# Patient Record
Sex: Male | Born: 1935 | Race: White | Hispanic: No | Marital: Married | Smoking: Never smoker
Health system: Southern US, Community
[De-identification: ages and names within clinical notes are randomized; demographics above are authoritative.]

## PROBLEM LIST (undated history)

## (undated) DIAGNOSIS — K226 Gastro-esophageal laceration-hemorrhage syndrome: Secondary | ICD-10-CM

## (undated) DIAGNOSIS — I2510 Atherosclerotic heart disease of native coronary artery without angina pectoris: Secondary | ICD-10-CM

## (undated) DIAGNOSIS — D649 Anemia, unspecified: Secondary | ICD-10-CM

## (undated) DIAGNOSIS — E039 Hypothyroidism, unspecified: Secondary | ICD-10-CM

## (undated) DIAGNOSIS — I743 Embolism and thrombosis of arteries of the lower extremities: Secondary | ICD-10-CM

## (undated) DIAGNOSIS — N400 Enlarged prostate without lower urinary tract symptoms: Secondary | ICD-10-CM

## (undated) DIAGNOSIS — S301XXA Contusion of abdominal wall, initial encounter: Secondary | ICD-10-CM

## (undated) DIAGNOSIS — K2270 Barrett's esophagus without dysplasia: Secondary | ICD-10-CM

## (undated) DIAGNOSIS — N62 Hypertrophy of breast: Secondary | ICD-10-CM

## (undated) DIAGNOSIS — Z9289 Personal history of other medical treatment: Secondary | ICD-10-CM

## (undated) DIAGNOSIS — I10 Essential (primary) hypertension: Secondary | ICD-10-CM

## (undated) DIAGNOSIS — F3289 Other specified depressive episodes: Secondary | ICD-10-CM

## (undated) DIAGNOSIS — I480 Paroxysmal atrial fibrillation: Secondary | ICD-10-CM

## (undated) DIAGNOSIS — I340 Nonrheumatic mitral (valve) insufficiency: Secondary | ICD-10-CM

## (undated) DIAGNOSIS — F329 Major depressive disorder, single episode, unspecified: Secondary | ICD-10-CM

## (undated) DIAGNOSIS — G4730 Sleep apnea, unspecified: Secondary | ICD-10-CM

## (undated) DIAGNOSIS — Z7901 Long term (current) use of anticoagulants: Secondary | ICD-10-CM

## (undated) DIAGNOSIS — K5730 Diverticulosis of large intestine without perforation or abscess without bleeding: Secondary | ICD-10-CM

## (undated) DIAGNOSIS — K219 Gastro-esophageal reflux disease without esophagitis: Secondary | ICD-10-CM

## (undated) DIAGNOSIS — E785 Hyperlipidemia, unspecified: Secondary | ICD-10-CM

## (undated) HISTORY — DX: Anemia, unspecified: D64.9

## (undated) HISTORY — DX: Sleep apnea, unspecified: G47.30

## (undated) HISTORY — PX: TRANSURETHRAL RESECTION OF PROSTATE: SHX73

## (undated) HISTORY — DX: Gastro-esophageal reflux disease without esophagitis: K21.9

## (undated) HISTORY — DX: Hypertrophy of breast: N62

## (undated) HISTORY — DX: Contusion of abdominal wall, initial encounter: S30.1XXA

## (undated) HISTORY — DX: Other specified depressive episodes: F32.89

## (undated) HISTORY — DX: Atherosclerotic heart disease of native coronary artery without angina pectoris: I25.10

## (undated) HISTORY — DX: Benign prostatic hyperplasia without lower urinary tract symptoms: N40.0

## (undated) HISTORY — DX: Essential (primary) hypertension: I10

## (undated) HISTORY — PX: ESOPHAGOGASTRODUODENOSCOPY: SHX1529

## (undated) HISTORY — DX: Hypothyroidism, unspecified: E03.9

## (undated) HISTORY — DX: Major depressive disorder, single episode, unspecified: F32.9

## (undated) HISTORY — DX: Hyperlipidemia, unspecified: E78.5

## (undated) HISTORY — PX: CORONARY ANGIOPLASTY WITH STENT PLACEMENT: SHX49

## (undated) HISTORY — DX: Diverticulosis of large intestine without perforation or abscess without bleeding: K57.30

---

## 1998-08-09 ENCOUNTER — Ambulatory Visit (HOSPITAL_COMMUNITY): Admission: RE | Admit: 1998-08-09 | Discharge: 1998-08-09 | Payer: Self-pay | Admitting: Gastroenterology

## 2000-03-06 ENCOUNTER — Other Ambulatory Visit: Admission: RE | Admit: 2000-03-06 | Discharge: 2000-03-06 | Payer: Self-pay | Admitting: Urology

## 2000-03-14 ENCOUNTER — Encounter: Payer: Self-pay | Admitting: Urology

## 2000-03-15 ENCOUNTER — Inpatient Hospital Stay (HOSPITAL_COMMUNITY): Admission: RE | Admit: 2000-03-15 | Discharge: 2000-03-18 | Payer: Self-pay | Admitting: Urology

## 2001-03-07 ENCOUNTER — Inpatient Hospital Stay (HOSPITAL_COMMUNITY): Admission: RE | Admit: 2001-03-07 | Discharge: 2001-03-10 | Payer: Self-pay | Admitting: *Deleted

## 2001-04-09 ENCOUNTER — Encounter: Admission: RE | Admit: 2001-04-09 | Discharge: 2001-07-08 | Payer: Self-pay | Admitting: Cardiology

## 2001-05-31 ENCOUNTER — Encounter (INDEPENDENT_AMBULATORY_CARE_PROVIDER_SITE_OTHER): Payer: Self-pay | Admitting: *Deleted

## 2001-05-31 ENCOUNTER — Ambulatory Visit (HOSPITAL_COMMUNITY): Admission: RE | Admit: 2001-05-31 | Discharge: 2001-05-31 | Payer: Self-pay | Admitting: Gastroenterology

## 2002-01-30 ENCOUNTER — Encounter: Admission: RE | Admit: 2002-01-30 | Discharge: 2002-04-30 | Payer: Self-pay | Admitting: Cardiology

## 2003-08-07 ENCOUNTER — Inpatient Hospital Stay (HOSPITAL_COMMUNITY): Admission: RE | Admit: 2003-08-07 | Discharge: 2003-08-12 | Payer: Self-pay | Admitting: Cardiology

## 2003-10-15 ENCOUNTER — Ambulatory Visit (HOSPITAL_COMMUNITY): Admission: RE | Admit: 2003-10-15 | Discharge: 2003-10-15 | Payer: Self-pay | Admitting: Cardiology

## 2004-05-23 ENCOUNTER — Ambulatory Visit: Payer: Self-pay | Admitting: Cardiology

## 2004-06-21 ENCOUNTER — Ambulatory Visit: Payer: Self-pay | Admitting: Internal Medicine

## 2004-06-24 ENCOUNTER — Ambulatory Visit: Payer: Self-pay

## 2004-07-15 ENCOUNTER — Ambulatory Visit: Payer: Self-pay | Admitting: Internal Medicine

## 2004-08-15 ENCOUNTER — Ambulatory Visit: Payer: Self-pay | Admitting: Internal Medicine

## 2004-09-15 ENCOUNTER — Ambulatory Visit: Payer: Self-pay | Admitting: Internal Medicine

## 2004-09-27 ENCOUNTER — Ambulatory Visit: Payer: Self-pay | Admitting: Internal Medicine

## 2004-10-13 ENCOUNTER — Ambulatory Visit: Payer: Self-pay | Admitting: Cardiology

## 2004-10-13 ENCOUNTER — Ambulatory Visit: Payer: Self-pay | Admitting: Internal Medicine

## 2004-10-13 ENCOUNTER — Inpatient Hospital Stay (HOSPITAL_COMMUNITY): Admission: EM | Admit: 2004-10-13 | Discharge: 2004-10-20 | Payer: Self-pay | Admitting: Emergency Medicine

## 2004-11-03 ENCOUNTER — Ambulatory Visit: Payer: Self-pay | Admitting: Cardiology

## 2004-11-07 ENCOUNTER — Encounter (HOSPITAL_COMMUNITY): Admission: RE | Admit: 2004-11-07 | Discharge: 2005-02-05 | Payer: Self-pay | Admitting: Cardiology

## 2004-12-09 ENCOUNTER — Ambulatory Visit: Payer: Self-pay | Admitting: Cardiology

## 2004-12-13 ENCOUNTER — Ambulatory Visit: Payer: Self-pay | Admitting: Internal Medicine

## 2004-12-30 ENCOUNTER — Ambulatory Visit: Payer: Self-pay | Admitting: Cardiology

## 2005-01-24 ENCOUNTER — Ambulatory Visit: Payer: Self-pay | Admitting: Internal Medicine

## 2005-02-07 ENCOUNTER — Encounter (HOSPITAL_COMMUNITY): Admission: RE | Admit: 2005-02-07 | Discharge: 2005-05-08 | Payer: Self-pay | Admitting: Cardiology

## 2005-03-09 ENCOUNTER — Inpatient Hospital Stay (HOSPITAL_COMMUNITY): Admission: EM | Admit: 2005-03-09 | Discharge: 2005-03-10 | Payer: Self-pay | Admitting: Emergency Medicine

## 2005-03-10 ENCOUNTER — Ambulatory Visit: Payer: Self-pay | Admitting: Internal Medicine

## 2005-03-13 ENCOUNTER — Ambulatory Visit: Payer: Self-pay | Admitting: Internal Medicine

## 2005-03-15 ENCOUNTER — Ambulatory Visit: Payer: Self-pay

## 2005-03-17 ENCOUNTER — Ambulatory Visit: Payer: Self-pay | Admitting: Cardiology

## 2005-04-25 ENCOUNTER — Ambulatory Visit: Payer: Self-pay | Admitting: Internal Medicine

## 2005-05-17 ENCOUNTER — Encounter (HOSPITAL_COMMUNITY): Admission: RE | Admit: 2005-05-17 | Discharge: 2005-08-15 | Payer: Self-pay | Admitting: Cardiology

## 2005-07-21 ENCOUNTER — Ambulatory Visit: Payer: Self-pay | Admitting: Cardiology

## 2005-07-25 ENCOUNTER — Ambulatory Visit: Payer: Self-pay | Admitting: Internal Medicine

## 2005-08-01 ENCOUNTER — Ambulatory Visit: Payer: Self-pay

## 2005-08-03 ENCOUNTER — Ambulatory Visit: Payer: Self-pay | Admitting: Critical Care Medicine

## 2005-08-17 ENCOUNTER — Encounter (HOSPITAL_COMMUNITY): Admission: RE | Admit: 2005-08-17 | Discharge: 2005-11-15 | Payer: Self-pay | Admitting: Cardiology

## 2005-10-13 ENCOUNTER — Ambulatory Visit: Payer: Self-pay | Admitting: Cardiovascular Disease

## 2005-10-16 ENCOUNTER — Ambulatory Visit: Payer: Self-pay | Admitting: Cardiology

## 2005-10-16 ENCOUNTER — Ambulatory Visit: Payer: Self-pay

## 2005-10-19 ENCOUNTER — Inpatient Hospital Stay (HOSPITAL_COMMUNITY): Admission: EM | Admit: 2005-10-19 | Discharge: 2005-10-25 | Payer: Self-pay | Admitting: Emergency Medicine

## 2005-10-19 ENCOUNTER — Ambulatory Visit: Payer: Self-pay | Admitting: Internal Medicine

## 2005-11-06 ENCOUNTER — Ambulatory Visit: Payer: Self-pay | Admitting: Pulmonary Disease

## 2005-11-10 ENCOUNTER — Ambulatory Visit: Payer: Self-pay | Admitting: Cardiology

## 2005-11-20 ENCOUNTER — Ambulatory Visit (HOSPITAL_COMMUNITY): Admission: RE | Admit: 2005-11-20 | Discharge: 2005-11-20 | Payer: Self-pay | Admitting: Vascular Surgery

## 2005-11-23 ENCOUNTER — Ambulatory Visit: Payer: Self-pay | Admitting: Internal Medicine

## 2005-12-08 ENCOUNTER — Ambulatory Visit (HOSPITAL_BASED_OUTPATIENT_CLINIC_OR_DEPARTMENT_OTHER): Admission: RE | Admit: 2005-12-08 | Discharge: 2005-12-08 | Payer: Self-pay | Admitting: Pulmonary Disease

## 2005-12-08 ENCOUNTER — Ambulatory Visit: Payer: Self-pay | Admitting: Pulmonary Disease

## 2005-12-28 ENCOUNTER — Ambulatory Visit: Payer: Self-pay | Admitting: Cardiology

## 2006-01-12 ENCOUNTER — Ambulatory Visit: Payer: Self-pay | Admitting: Pulmonary Disease

## 2006-01-19 ENCOUNTER — Ambulatory Visit: Payer: Self-pay | Admitting: Pulmonary Disease

## 2006-01-30 ENCOUNTER — Ambulatory Visit: Payer: Self-pay | Admitting: Pulmonary Disease

## 2006-02-26 ENCOUNTER — Ambulatory Visit: Payer: Self-pay | Admitting: Internal Medicine

## 2006-02-27 ENCOUNTER — Ambulatory Visit: Payer: Self-pay | Admitting: Pulmonary Disease

## 2006-03-20 ENCOUNTER — Encounter (HOSPITAL_COMMUNITY): Admission: RE | Admit: 2006-03-20 | Discharge: 2006-06-18 | Payer: Self-pay | Admitting: Cardiology

## 2006-04-09 ENCOUNTER — Ambulatory Visit: Payer: Self-pay | Admitting: Internal Medicine

## 2006-04-18 ENCOUNTER — Ambulatory Visit: Payer: Self-pay | Admitting: Internal Medicine

## 2006-04-19 ENCOUNTER — Ambulatory Visit: Payer: Self-pay | Admitting: Cardiology

## 2006-05-08 ENCOUNTER — Ambulatory Visit: Payer: Self-pay | Admitting: Pulmonary Disease

## 2006-06-12 ENCOUNTER — Ambulatory Visit: Payer: Self-pay | Admitting: Cardiology

## 2006-06-18 ENCOUNTER — Ambulatory Visit: Payer: Self-pay | Admitting: Internal Medicine

## 2006-06-19 ENCOUNTER — Encounter (HOSPITAL_COMMUNITY): Admission: RE | Admit: 2006-06-19 | Discharge: 2006-09-17 | Payer: Self-pay | Admitting: Cardiology

## 2006-07-05 ENCOUNTER — Ambulatory Visit: Payer: Self-pay | Admitting: Internal Medicine

## 2006-07-19 ENCOUNTER — Ambulatory Visit: Payer: Self-pay | Admitting: Internal Medicine

## 2006-08-15 ENCOUNTER — Ambulatory Visit: Payer: Self-pay | Admitting: Cardiovascular Disease

## 2006-08-15 LAB — CONVERTED CEMR LAB
ALT: 19 units/L (ref 0–40)
AST: 25 units/L (ref 0–37)
Albumin: 4 g/dL (ref 3.5–5.2)
BUN: 15 mg/dL (ref 6–23)
Basophils Absolute: 0 10*3/uL (ref 0.0–0.1)
Bilirubin, Direct: 0.1 mg/dL (ref 0.0–0.3)
Eosinophils Absolute: 0.1 10*3/uL (ref 0.0–0.6)
Glucose, Bld: 95 mg/dL (ref 70–99)
HCT: 40.9 % (ref 39.0–52.0)
Hemoglobin: 14.2 g/dL (ref 13.0–17.0)
MCHC: 34.9 g/dL (ref 30.0–36.0)
Monocytes Absolute: 0.5 10*3/uL (ref 0.2–0.7)
Monocytes Relative: 9.7 % (ref 3.0–11.0)
Neutro Abs: 3.3 10*3/uL (ref 1.4–7.7)
RBC: 4.32 M/uL (ref 4.22–5.81)
RDW: 13.4 % (ref 11.5–14.6)
Total Bilirubin: 0.6 mg/dL (ref 0.3–1.2)
Total Protein: 6.7 g/dL (ref 6.0–8.3)
WBC: 5.3 10*3/uL (ref 4.5–10.5)

## 2006-08-23 ENCOUNTER — Ambulatory Visit: Payer: Self-pay

## 2006-08-27 ENCOUNTER — Ambulatory Visit: Payer: Self-pay | Admitting: Cardiology

## 2006-09-10 ENCOUNTER — Ambulatory Visit: Payer: Self-pay | Admitting: Cardiology

## 2006-09-10 ENCOUNTER — Encounter: Payer: Self-pay | Admitting: Cardiovascular Disease

## 2006-09-10 ENCOUNTER — Ambulatory Visit: Payer: Self-pay

## 2006-09-10 LAB — CONVERTED CEMR LAB
AST: 23 units/L (ref 0–37)
Bilirubin, Direct: 0.1 mg/dL (ref 0.0–0.3)
HDL: 52.2 mg/dL (ref >39.0)
Total Bilirubin: 0.7 mg/dL (ref 0.3–1.2)
Total CHOL/HDL Ratio: 2.5
Total Protein: 6.5 g/dL (ref 6.0–8.3)
Triglycerides: 69 mg/dL (ref 0–149)

## 2006-09-18 ENCOUNTER — Encounter (HOSPITAL_COMMUNITY): Admission: RE | Admit: 2006-09-18 | Discharge: 2006-12-17 | Payer: Self-pay | Admitting: Cardiology

## 2006-09-20 ENCOUNTER — Ambulatory Visit: Payer: Self-pay | Admitting: Internal Medicine

## 2006-09-26 ENCOUNTER — Ambulatory Visit (HOSPITAL_BASED_OUTPATIENT_CLINIC_OR_DEPARTMENT_OTHER): Admission: RE | Admit: 2006-09-26 | Discharge: 2006-09-26 | Payer: Self-pay | Admitting: Pulmonary Disease

## 2006-09-26 ENCOUNTER — Ambulatory Visit: Payer: Self-pay | Admitting: Pulmonary Disease

## 2006-10-11 ENCOUNTER — Ambulatory Visit: Payer: Self-pay | Admitting: Internal Medicine

## 2006-10-16 ENCOUNTER — Ambulatory Visit: Payer: Self-pay | Admitting: Cardiology

## 2006-10-26 ENCOUNTER — Ambulatory Visit: Payer: Self-pay | Admitting: Pulmonary Disease

## 2006-11-21 ENCOUNTER — Ambulatory Visit (HOSPITAL_BASED_OUTPATIENT_CLINIC_OR_DEPARTMENT_OTHER): Admission: RE | Admit: 2006-11-21 | Discharge: 2006-11-21 | Payer: Self-pay | Admitting: Pulmonary Disease

## 2006-11-21 ENCOUNTER — Ambulatory Visit: Payer: Self-pay | Admitting: Pulmonary Disease

## 2006-12-18 ENCOUNTER — Encounter (HOSPITAL_COMMUNITY): Admission: RE | Admit: 2006-12-18 | Discharge: 2007-03-18 | Payer: Self-pay | Admitting: Cardiology

## 2007-01-07 ENCOUNTER — Ambulatory Visit: Payer: Self-pay | Admitting: Pulmonary Disease

## 2007-01-15 ENCOUNTER — Ambulatory Visit: Payer: Self-pay | Admitting: Internal Medicine

## 2007-01-15 ENCOUNTER — Encounter: Payer: Self-pay | Admitting: Internal Medicine

## 2007-01-15 ENCOUNTER — Ambulatory Visit: Payer: Self-pay | Admitting: Cardiology

## 2007-01-15 ENCOUNTER — Inpatient Hospital Stay (HOSPITAL_COMMUNITY): Admission: EM | Admit: 2007-01-15 | Discharge: 2007-01-19 | Payer: Self-pay | Admitting: Emergency Medicine

## 2007-01-15 HISTORY — PX: TEE WITH CARDIOVERSION: SHX5442

## 2007-01-16 ENCOUNTER — Encounter: Payer: Self-pay | Admitting: Cardiology

## 2007-01-17 ENCOUNTER — Encounter: Payer: Self-pay | Admitting: Internal Medicine

## 2007-01-17 ENCOUNTER — Ambulatory Visit: Payer: Self-pay | Admitting: Vascular Surgery

## 2007-01-22 ENCOUNTER — Ambulatory Visit: Payer: Self-pay | Admitting: Cardiology

## 2007-01-28 ENCOUNTER — Ambulatory Visit: Payer: Self-pay | Admitting: Cardiovascular Disease

## 2007-02-08 ENCOUNTER — Ambulatory Visit: Payer: Self-pay

## 2007-02-08 ENCOUNTER — Ambulatory Visit: Payer: Self-pay | Admitting: Cardiology

## 2007-02-13 ENCOUNTER — Ambulatory Visit: Payer: Self-pay | Admitting: Cardiology

## 2007-02-13 ENCOUNTER — Inpatient Hospital Stay (HOSPITAL_COMMUNITY): Admission: AD | Admit: 2007-02-13 | Discharge: 2007-02-21 | Payer: Self-pay | Admitting: Cardiology

## 2007-02-15 HISTORY — PX: OTHER SURGICAL HISTORY: SHX169

## 2007-02-26 ENCOUNTER — Ambulatory Visit: Payer: Self-pay | Admitting: Cardiology

## 2007-03-01 ENCOUNTER — Ambulatory Visit: Payer: Self-pay | Admitting: Cardiology

## 2007-03-05 ENCOUNTER — Ambulatory Visit: Payer: Self-pay | Admitting: Cardiology

## 2007-03-05 ENCOUNTER — Inpatient Hospital Stay (HOSPITAL_COMMUNITY): Admission: EM | Admit: 2007-03-05 | Discharge: 2007-03-08 | Payer: Self-pay | Admitting: Emergency Medicine

## 2007-03-13 ENCOUNTER — Ambulatory Visit: Payer: Self-pay | Admitting: Cardiology

## 2007-03-14 ENCOUNTER — Ambulatory Visit: Payer: Self-pay | Admitting: Cardiology

## 2007-03-19 ENCOUNTER — Encounter (HOSPITAL_COMMUNITY): Admission: RE | Admit: 2007-03-19 | Discharge: 2007-03-19 | Payer: Self-pay | Admitting: Cardiology

## 2007-03-21 ENCOUNTER — Ambulatory Visit: Payer: Self-pay | Admitting: Cardiology

## 2007-03-22 ENCOUNTER — Ambulatory Visit: Admission: RE | Admit: 2007-03-22 | Discharge: 2007-03-22 | Payer: Self-pay | Admitting: Cardiology

## 2007-03-25 ENCOUNTER — Ambulatory Visit: Payer: Self-pay | Admitting: Cardiology

## 2007-03-25 LAB — CONVERTED CEMR LAB: Prothrombin Time: 24.5 s — ABNORMAL HIGH (ref 10.9–13.3)

## 2007-04-01 ENCOUNTER — Ambulatory Visit: Payer: Self-pay | Admitting: Internal Medicine

## 2007-04-10 DIAGNOSIS — N400 Enlarged prostate without lower urinary tract symptoms: Secondary | ICD-10-CM

## 2007-04-10 DIAGNOSIS — K5730 Diverticulosis of large intestine without perforation or abscess without bleeding: Secondary | ICD-10-CM | POA: Insufficient documentation

## 2007-04-10 DIAGNOSIS — I10 Essential (primary) hypertension: Secondary | ICD-10-CM

## 2007-04-10 DIAGNOSIS — I2510 Atherosclerotic heart disease of native coronary artery without angina pectoris: Secondary | ICD-10-CM | POA: Insufficient documentation

## 2007-04-10 DIAGNOSIS — K219 Gastro-esophageal reflux disease without esophagitis: Secondary | ICD-10-CM | POA: Insufficient documentation

## 2007-04-12 ENCOUNTER — Ambulatory Visit: Payer: Self-pay | Admitting: Internal Medicine

## 2007-04-22 ENCOUNTER — Ambulatory Visit: Payer: Self-pay | Admitting: Cardiology

## 2007-05-06 ENCOUNTER — Ambulatory Visit: Payer: Self-pay | Admitting: Cardiology

## 2007-05-09 ENCOUNTER — Ambulatory Visit: Payer: Self-pay | Admitting: Pulmonary Disease

## 2007-05-20 ENCOUNTER — Ambulatory Visit: Payer: Self-pay | Admitting: Cardiology

## 2007-05-20 ENCOUNTER — Ambulatory Visit: Payer: Self-pay | Admitting: Pulmonary Disease

## 2007-05-20 DIAGNOSIS — G4733 Obstructive sleep apnea (adult) (pediatric): Secondary | ICD-10-CM | POA: Insufficient documentation

## 2007-05-20 DIAGNOSIS — E039 Hypothyroidism, unspecified: Secondary | ICD-10-CM | POA: Insufficient documentation

## 2007-05-29 ENCOUNTER — Ambulatory Visit: Payer: Self-pay | Admitting: Internal Medicine

## 2007-06-03 ENCOUNTER — Ambulatory Visit: Payer: Self-pay | Admitting: Cardiology

## 2007-06-17 ENCOUNTER — Ambulatory Visit: Payer: Self-pay

## 2007-06-17 ENCOUNTER — Encounter: Payer: Self-pay | Admitting: Cardiology

## 2007-07-02 ENCOUNTER — Ambulatory Visit: Payer: Self-pay | Admitting: Internal Medicine

## 2007-07-24 ENCOUNTER — Ambulatory Visit: Payer: Self-pay | Admitting: Cardiovascular Disease

## 2007-08-08 ENCOUNTER — Ambulatory Visit: Payer: Self-pay | Admitting: Cardiology

## 2007-08-09 ENCOUNTER — Encounter: Payer: Self-pay | Admitting: Pulmonary Disease

## 2007-08-09 ENCOUNTER — Ambulatory Visit: Payer: Self-pay

## 2007-08-22 ENCOUNTER — Ambulatory Visit: Payer: Self-pay | Admitting: Cardiology

## 2007-08-27 ENCOUNTER — Encounter (HOSPITAL_COMMUNITY): Admission: RE | Admit: 2007-08-27 | Discharge: 2007-11-15 | Payer: Self-pay | Admitting: Cardiology

## 2007-09-05 ENCOUNTER — Ambulatory Visit: Payer: Self-pay | Admitting: Cardiology

## 2007-09-26 ENCOUNTER — Ambulatory Visit: Payer: Self-pay | Admitting: Internal Medicine

## 2007-09-26 ENCOUNTER — Ambulatory Visit: Payer: Self-pay | Admitting: Cardiology

## 2007-09-26 LAB — CONVERTED CEMR LAB
ALT: 20 units/L (ref 0–53)
AST: 24 units/L (ref 0–37)
Albumin: 3.8 g/dL (ref 3.5–5.2)
Alkaline Phosphatase: 86 units/L (ref 39–117)
BUN: 15 mg/dL (ref 6–23)
Bilirubin, Direct: 0.1 mg/dL (ref 0.0–0.3)
Calcium: 8.7 mg/dL (ref 8.4–10.5)
Chloride: 108 meq/L (ref 96–112)
GFR calc Af Amer: 75 mL/min
GFR calc non Af Amer: 62 mL/min
Glucose, Bld: 118 mg/dL — ABNORMAL HIGH (ref 70–99)
Hemoglobin: 12.7 g/dL — ABNORMAL LOW (ref 13.0–17.0)
Lymphocytes Relative: 20.8 % (ref 12.0–46.0)
MCHC: 33.2 g/dL (ref 30.0–36.0)
Monocytes Absolute: 0.5 10*3/uL (ref 0.2–0.7)
Neutro Abs: 3.3 10*3/uL (ref 1.4–7.7)
Platelets: 183 10*3/uL (ref 150–400)
Potassium: 4.3 meq/L (ref 3.5–5.1)
TSH: 3.18 microintl units/mL (ref 0.35–5.50)
Total Bilirubin: 0.6 mg/dL (ref 0.3–1.2)
Total Protein: 6.1 g/dL (ref 6.0–8.3)
WBC: 4.9 10*3/uL (ref 4.5–10.5)

## 2007-10-10 ENCOUNTER — Ambulatory Visit: Payer: Self-pay | Admitting: Cardiology

## 2007-10-10 ENCOUNTER — Ambulatory Visit: Payer: Self-pay | Admitting: Internal Medicine

## 2007-10-31 ENCOUNTER — Ambulatory Visit: Payer: Self-pay | Admitting: Cardiology

## 2007-11-12 ENCOUNTER — Ambulatory Visit: Payer: Self-pay | Admitting: Cardiology

## 2007-11-17 ENCOUNTER — Encounter (HOSPITAL_COMMUNITY): Admission: RE | Admit: 2007-11-17 | Discharge: 2008-01-15 | Payer: Self-pay | Admitting: Cardiology

## 2007-11-27 ENCOUNTER — Ambulatory Visit: Payer: Self-pay | Admitting: Cardiology

## 2007-12-04 ENCOUNTER — Encounter: Payer: Self-pay | Admitting: Internal Medicine

## 2007-12-04 ENCOUNTER — Ambulatory Visit: Payer: Self-pay

## 2007-12-05 ENCOUNTER — Ambulatory Visit: Payer: Self-pay | Admitting: Cardiology

## 2007-12-05 ENCOUNTER — Inpatient Hospital Stay (HOSPITAL_COMMUNITY): Admission: EM | Admit: 2007-12-05 | Discharge: 2007-12-06 | Payer: Self-pay | Admitting: Emergency Medicine

## 2007-12-10 ENCOUNTER — Ambulatory Visit: Payer: Self-pay | Admitting: Cardiology

## 2007-12-17 ENCOUNTER — Ambulatory Visit: Payer: Self-pay | Admitting: Internal Medicine

## 2007-12-17 DIAGNOSIS — F329 Major depressive disorder, single episode, unspecified: Secondary | ICD-10-CM

## 2008-01-07 ENCOUNTER — Ambulatory Visit: Payer: Self-pay | Admitting: Cardiovascular Disease

## 2008-01-07 ENCOUNTER — Ambulatory Visit: Payer: Self-pay | Admitting: Internal Medicine

## 2008-01-16 ENCOUNTER — Encounter (HOSPITAL_COMMUNITY): Admission: RE | Admit: 2008-01-16 | Discharge: 2008-04-15 | Payer: Self-pay | Admitting: Cardiology

## 2008-02-04 ENCOUNTER — Ambulatory Visit: Payer: Self-pay | Admitting: Cardiology

## 2008-02-28 ENCOUNTER — Ambulatory Visit: Payer: Self-pay | Admitting: Cardiology

## 2008-02-28 ENCOUNTER — Ambulatory Visit: Payer: Self-pay | Admitting: Cardiovascular Disease

## 2008-03-04 ENCOUNTER — Encounter: Payer: Self-pay | Admitting: Internal Medicine

## 2008-03-04 ENCOUNTER — Ambulatory Visit: Admission: RE | Admit: 2008-03-04 | Discharge: 2008-03-04 | Payer: Self-pay | Admitting: Cardiology

## 2008-03-09 ENCOUNTER — Ambulatory Visit: Payer: Self-pay | Admitting: Internal Medicine

## 2008-03-26 ENCOUNTER — Ambulatory Visit: Payer: Self-pay | Admitting: Cardiology

## 2008-04-16 ENCOUNTER — Encounter (HOSPITAL_COMMUNITY): Admission: RE | Admit: 2008-04-16 | Discharge: 2008-07-13 | Payer: Self-pay | Admitting: Cardiology

## 2008-04-28 ENCOUNTER — Ambulatory Visit: Payer: Self-pay | Admitting: Cardiovascular Disease

## 2008-04-29 ENCOUNTER — Ambulatory Visit: Payer: Self-pay

## 2008-04-29 ENCOUNTER — Encounter: Payer: Self-pay | Admitting: Internal Medicine

## 2008-05-26 ENCOUNTER — Ambulatory Visit: Payer: Self-pay | Admitting: Internal Medicine

## 2008-05-27 ENCOUNTER — Ambulatory Visit: Payer: Self-pay | Admitting: Internal Medicine

## 2008-06-17 ENCOUNTER — Ambulatory Visit: Payer: Self-pay

## 2008-06-17 ENCOUNTER — Ambulatory Visit: Payer: Self-pay | Admitting: Cardiology

## 2008-06-17 ENCOUNTER — Ambulatory Visit: Payer: Self-pay | Admitting: Internal Medicine

## 2008-06-17 ENCOUNTER — Encounter: Payer: Self-pay | Admitting: Cardiology

## 2008-07-08 ENCOUNTER — Ambulatory Visit: Payer: Self-pay | Admitting: Cardiovascular Disease

## 2008-07-17 ENCOUNTER — Encounter (HOSPITAL_COMMUNITY): Admission: RE | Admit: 2008-07-17 | Discharge: 2008-10-15 | Payer: Self-pay | Admitting: Cardiology

## 2008-08-05 ENCOUNTER — Ambulatory Visit: Payer: Self-pay | Admitting: Internal Medicine

## 2008-09-02 ENCOUNTER — Ambulatory Visit: Payer: Self-pay | Admitting: Cardiology

## 2008-09-30 ENCOUNTER — Ambulatory Visit: Payer: Self-pay | Admitting: Cardiology

## 2008-10-07 ENCOUNTER — Ambulatory Visit: Payer: Self-pay | Admitting: Internal Medicine

## 2008-10-07 LAB — CONVERTED CEMR LAB
ALT: 20 units/L (ref 0–53)
AST: 24 units/L (ref 0–37)
Albumin: 3.6 g/dL (ref 3.5–5.2)
Basophils Relative: 0 % (ref 0.0–3.0)
CO2: 30 meq/L (ref 19–32)
Calcium: 8.9 mg/dL (ref 8.4–10.5)
Creatinine, Ser: 1.2 mg/dL (ref 0.4–1.5)
Eosinophils Relative: 1.5 % (ref 0.0–5.0)
GFR calc non Af Amer: 62.02 mL/min (ref >60)
Glucose, Bld: 89 mg/dL (ref 70–99)
HCT: 38.6 % — ABNORMAL LOW (ref 39.0–52.0)
HDL: 61.1 mg/dL (ref >39.00)
MCV: 92.4 fL (ref 78.0–100.0)
Monocytes Absolute: 0.5 10*3/uL (ref 0.1–1.0)
Monocytes Relative: 10 % (ref 3.0–12.0)
Neutro Abs: 3.1 10*3/uL (ref 1.4–7.7)
Neutrophils Relative %: 65 % (ref 43.0–77.0)
RDW: 14.3 % (ref 11.5–14.6)
Sodium: 143 meq/L (ref 135–145)
TSH: 2.15 microintl units/mL (ref 0.35–5.50)
Total Bilirubin: 0.9 mg/dL (ref 0.3–1.2)
Triglycerides: 32 mg/dL (ref 0.0–149.0)
VLDL: 6.4 mg/dL (ref 0.0–40.0)

## 2008-10-16 ENCOUNTER — Encounter (HOSPITAL_COMMUNITY): Admission: RE | Admit: 2008-10-16 | Discharge: 2009-01-14 | Payer: Self-pay | Admitting: Cardiology

## 2008-10-20 ENCOUNTER — Encounter: Payer: Self-pay | Admitting: Cardiology

## 2008-10-20 ENCOUNTER — Ambulatory Visit: Payer: Self-pay

## 2008-10-20 DIAGNOSIS — E785 Hyperlipidemia, unspecified: Secondary | ICD-10-CM

## 2008-10-20 DIAGNOSIS — I340 Nonrheumatic mitral (valve) insufficiency: Secondary | ICD-10-CM

## 2008-11-26 ENCOUNTER — Ambulatory Visit: Payer: Self-pay | Admitting: Cardiovascular Disease

## 2008-12-15 ENCOUNTER — Encounter: Payer: Self-pay | Admitting: *Deleted

## 2008-12-17 ENCOUNTER — Ambulatory Visit: Payer: Self-pay | Admitting: Cardiology

## 2008-12-17 LAB — CONVERTED CEMR LAB
POC INR: 3.2
Protime: 21.6

## 2008-12-24 ENCOUNTER — Ambulatory Visit: Payer: Self-pay | Admitting: Cardiology

## 2008-12-24 LAB — CONVERTED CEMR LAB
POC INR: 2.3
Protime: 18.7

## 2008-12-25 ENCOUNTER — Encounter: Payer: Self-pay | Admitting: Internal Medicine

## 2009-01-12 ENCOUNTER — Encounter: Payer: Self-pay | Admitting: Cardiology

## 2009-01-15 ENCOUNTER — Encounter (HOSPITAL_COMMUNITY): Admission: RE | Admit: 2009-01-15 | Discharge: 2009-04-15 | Payer: Self-pay | Admitting: Cardiology

## 2009-01-20 ENCOUNTER — Encounter: Payer: Self-pay | Admitting: *Deleted

## 2009-01-21 ENCOUNTER — Ambulatory Visit: Payer: Self-pay | Admitting: Cardiology

## 2009-02-04 ENCOUNTER — Ambulatory Visit: Payer: Self-pay | Admitting: Cardiovascular Disease

## 2009-02-04 LAB — CONVERTED CEMR LAB: Prothrombin Time: 22.9 s

## 2009-02-16 ENCOUNTER — Ambulatory Visit: Payer: Self-pay | Admitting: Cardiovascular Disease

## 2009-02-16 LAB — CONVERTED CEMR LAB
POC INR: 2.8
Prothrombin Time: 20.1 s

## 2009-02-23 ENCOUNTER — Ambulatory Visit: Payer: Self-pay | Admitting: Internal Medicine

## 2009-03-03 ENCOUNTER — Encounter (INDEPENDENT_AMBULATORY_CARE_PROVIDER_SITE_OTHER): Payer: Self-pay | Admitting: *Deleted

## 2009-03-11 ENCOUNTER — Ambulatory Visit: Payer: Self-pay | Admitting: Cardiovascular Disease

## 2009-04-08 ENCOUNTER — Ambulatory Visit: Payer: Self-pay | Admitting: Internal Medicine

## 2009-04-08 LAB — CONVERTED CEMR LAB: POC INR: 2.8

## 2009-04-09 ENCOUNTER — Ambulatory Visit: Payer: Self-pay | Admitting: Internal Medicine

## 2009-04-16 ENCOUNTER — Encounter (HOSPITAL_COMMUNITY): Admission: RE | Admit: 2009-04-16 | Discharge: 2009-07-15 | Payer: Self-pay | Admitting: Cardiology

## 2009-05-06 ENCOUNTER — Ambulatory Visit: Payer: Self-pay | Admitting: Cardiovascular Disease

## 2009-05-06 LAB — CONVERTED CEMR LAB: POC INR: 2.2

## 2009-05-10 ENCOUNTER — Encounter: Payer: Self-pay | Admitting: Cardiology

## 2009-05-11 ENCOUNTER — Ambulatory Visit: Payer: Self-pay | Admitting: Cardiology

## 2009-05-11 DIAGNOSIS — R079 Chest pain, unspecified: Secondary | ICD-10-CM

## 2009-05-18 ENCOUNTER — Telehealth (INDEPENDENT_AMBULATORY_CARE_PROVIDER_SITE_OTHER): Payer: Self-pay | Admitting: *Deleted

## 2009-05-19 ENCOUNTER — Encounter (HOSPITAL_COMMUNITY): Admission: RE | Admit: 2009-05-19 | Discharge: 2009-07-14 | Payer: Self-pay | Admitting: Cardiology

## 2009-05-19 ENCOUNTER — Ambulatory Visit: Payer: Self-pay | Admitting: Cardiovascular Disease

## 2009-05-19 ENCOUNTER — Ambulatory Visit: Payer: Self-pay

## 2009-06-03 ENCOUNTER — Ambulatory Visit: Payer: Self-pay | Admitting: Internal Medicine

## 2009-06-17 ENCOUNTER — Encounter (INDEPENDENT_AMBULATORY_CARE_PROVIDER_SITE_OTHER): Payer: Self-pay | Admitting: *Deleted

## 2009-06-22 ENCOUNTER — Ambulatory Visit (HOSPITAL_COMMUNITY): Admission: RE | Admit: 2009-06-22 | Discharge: 2009-06-22 | Payer: Self-pay | Admitting: Cardiology

## 2009-06-22 ENCOUNTER — Ambulatory Visit: Payer: Self-pay | Admitting: Cardiovascular Disease

## 2009-06-22 ENCOUNTER — Ambulatory Visit: Payer: Self-pay

## 2009-06-22 ENCOUNTER — Encounter: Payer: Self-pay | Admitting: Cardiology

## 2009-06-22 ENCOUNTER — Ambulatory Visit: Payer: Self-pay | Admitting: Internal Medicine

## 2009-07-17 ENCOUNTER — Encounter (HOSPITAL_COMMUNITY): Admission: RE | Admit: 2009-07-17 | Discharge: 2009-10-15 | Payer: Self-pay | Admitting: Cardiology

## 2009-07-20 ENCOUNTER — Ambulatory Visit: Payer: Self-pay | Admitting: Cardiology

## 2009-07-23 ENCOUNTER — Encounter: Payer: Self-pay | Admitting: Cardiology

## 2009-08-03 ENCOUNTER — Ambulatory Visit: Payer: Self-pay | Admitting: Cardiology

## 2009-08-05 ENCOUNTER — Ambulatory Visit: Payer: Self-pay | Admitting: Internal Medicine

## 2009-08-05 LAB — CONVERTED CEMR LAB
ALT: 26 units/L (ref 0–53)
AST: 30 units/L (ref 0–37)
Albumin: 3.8 g/dL (ref 3.5–5.2)
Eosinophils Relative: 2.2 % (ref 0.0–5.0)
HCT: 36 % — ABNORMAL LOW (ref 39.0–52.0)
Hemoglobin: 11.8 g/dL — ABNORMAL LOW (ref 13.0–17.0)
Lymphs Abs: 1.3 10*3/uL (ref 0.7–4.0)
MCV: 94.6 fL (ref 78.0–100.0)
Monocytes Absolute: 0.6 10*3/uL (ref 0.1–1.0)
Neutro Abs: 2.6 10*3/uL (ref 1.4–7.7)
Platelets: 167 10*3/uL (ref 150.0–400.0)
RDW: 14.6 % (ref 11.5–14.6)
TSH: 3.36 microintl units/mL (ref 0.35–5.50)
Total Bilirubin: 0.7 mg/dL (ref 0.3–1.2)
WBC: 4.6 10*3/uL (ref 4.5–10.5)

## 2009-08-09 ENCOUNTER — Encounter: Payer: Self-pay | Admitting: Cardiology

## 2009-08-17 ENCOUNTER — Ambulatory Visit: Payer: Self-pay | Admitting: Internal Medicine

## 2009-08-17 LAB — CONVERTED CEMR LAB: POC INR: 2.8

## 2009-08-25 ENCOUNTER — Encounter: Payer: Self-pay | Admitting: Cardiology

## 2009-09-14 ENCOUNTER — Encounter: Payer: Self-pay | Admitting: Cardiology

## 2009-09-14 ENCOUNTER — Ambulatory Visit: Payer: Self-pay | Admitting: Cardiovascular Disease

## 2009-09-14 LAB — CONVERTED CEMR LAB: POC INR: 3.3

## 2009-09-16 ENCOUNTER — Ambulatory Visit: Payer: Self-pay | Admitting: Internal Medicine

## 2009-09-16 DIAGNOSIS — M549 Dorsalgia, unspecified: Secondary | ICD-10-CM | POA: Insufficient documentation

## 2009-09-20 ENCOUNTER — Ambulatory Visit: Payer: Self-pay | Admitting: Internal Medicine

## 2009-09-21 ENCOUNTER — Telehealth: Payer: Self-pay | Admitting: Internal Medicine

## 2009-09-21 ENCOUNTER — Ambulatory Visit: Payer: Self-pay | Admitting: Internal Medicine

## 2009-09-22 ENCOUNTER — Emergency Department (HOSPITAL_COMMUNITY): Admission: EM | Admit: 2009-09-22 | Discharge: 2009-09-22 | Payer: Self-pay | Admitting: Emergency Medicine

## 2009-09-22 ENCOUNTER — Telehealth: Payer: Self-pay | Admitting: Internal Medicine

## 2009-10-05 ENCOUNTER — Ambulatory Visit: Payer: Self-pay | Admitting: Cardiovascular Disease

## 2009-10-16 ENCOUNTER — Encounter (HOSPITAL_COMMUNITY): Admission: RE | Admit: 2009-10-16 | Discharge: 2009-12-14 | Payer: Self-pay | Admitting: Cardiology

## 2009-10-19 ENCOUNTER — Ambulatory Visit: Payer: Self-pay | Admitting: Cardiology

## 2009-10-19 LAB — CONVERTED CEMR LAB: POC INR: 2.8

## 2009-10-26 ENCOUNTER — Telehealth (INDEPENDENT_AMBULATORY_CARE_PROVIDER_SITE_OTHER): Payer: Self-pay | Admitting: *Deleted

## 2009-11-04 ENCOUNTER — Ambulatory Visit: Payer: Self-pay | Admitting: Internal Medicine

## 2009-11-04 LAB — CONVERTED CEMR LAB: POC INR: 3

## 2009-11-09 ENCOUNTER — Ambulatory Visit: Payer: Self-pay | Admitting: Emergency Medicine

## 2009-11-09 ENCOUNTER — Ambulatory Visit: Payer: Self-pay | Admitting: Internal Medicine

## 2009-11-09 ENCOUNTER — Inpatient Hospital Stay (HOSPITAL_COMMUNITY): Admission: AD | Admit: 2009-11-09 | Discharge: 2009-11-22 | Payer: Self-pay | Admitting: Internal Medicine

## 2009-11-09 DIAGNOSIS — R5381 Other malaise: Secondary | ICD-10-CM

## 2009-11-09 DIAGNOSIS — R5383 Other fatigue: Secondary | ICD-10-CM

## 2009-11-09 DIAGNOSIS — Z9189 Other specified personal risk factors, not elsewhere classified: Secondary | ICD-10-CM | POA: Insufficient documentation

## 2009-11-15 ENCOUNTER — Encounter (INDEPENDENT_AMBULATORY_CARE_PROVIDER_SITE_OTHER): Payer: Self-pay | Admitting: Emergency Medicine

## 2009-11-15 ENCOUNTER — Encounter: Payer: Self-pay | Admitting: Cardiology

## 2009-11-25 ENCOUNTER — Ambulatory Visit: Payer: Self-pay | Admitting: Cardiology

## 2009-11-29 ENCOUNTER — Ambulatory Visit: Payer: Self-pay | Admitting: Internal Medicine

## 2009-11-29 LAB — CONVERTED CEMR LAB
Basophils Absolute: 0.1 10*3/uL (ref 0.0–0.1)
CO2: 29 meq/L (ref 19–32)
Chloride: 103 meq/L (ref 96–112)
Eosinophils Relative: 1.2 % (ref 0.0–5.0)
Lymphocytes Relative: 20.8 % (ref 12.0–46.0)
Monocytes Relative: 10 % (ref 3.0–12.0)
Platelets: 461 10*3/uL — ABNORMAL HIGH (ref 150.0–400.0)
Potassium: 5.1 meq/L (ref 3.5–5.1)
RDW: 17.5 % — ABNORMAL HIGH (ref 11.5–14.6)
Sodium: 140 meq/L (ref 135–145)
WBC: 7.2 10*3/uL (ref 4.5–10.5)

## 2009-12-01 ENCOUNTER — Encounter: Payer: Self-pay | Admitting: Internal Medicine

## 2009-12-16 ENCOUNTER — Ambulatory Visit: Payer: Self-pay | Admitting: Internal Medicine

## 2009-12-28 ENCOUNTER — Ambulatory Visit: Payer: Self-pay | Admitting: Internal Medicine

## 2010-02-01 ENCOUNTER — Encounter: Payer: Self-pay | Admitting: Cardiology

## 2010-03-23 ENCOUNTER — Ambulatory Visit: Payer: Self-pay | Admitting: Internal Medicine

## 2010-03-23 DIAGNOSIS — N62 Hypertrophy of breast: Secondary | ICD-10-CM

## 2010-03-29 ENCOUNTER — Ambulatory Visit: Payer: Self-pay | Admitting: Cardiology

## 2010-04-19 ENCOUNTER — Telehealth: Payer: Self-pay | Admitting: Internal Medicine

## 2010-04-19 ENCOUNTER — Ambulatory Visit: Payer: Self-pay | Admitting: Internal Medicine

## 2010-08-13 ENCOUNTER — Encounter: Payer: Self-pay | Admitting: Internal Medicine

## 2010-08-18 NOTE — Assessment & Plan Note (Signed)
Summary: acute back pain/dm   Vital Signs:  [name] profile:   77 year old male Weight:      202 pounds Temp:     98.7 degrees F oral BP sitting:   120 / 78  (left arm) Cuff size:   regular  Vitals Entered By: Duard Brady LPN (September 16, 1608 1:02 PM) CC: c/o back pain, (L) hip pain - no fall or injury noted , Tylenol otc not helping Is [name] Diabetic? No   Primary Care Provider:  Gordy Savers  MD  CC:  c/o back pain, (L) hip pain - no fall or injury noted , and Tylenol otc not helping.  History of Present Illness:  [name], who presents with a one month history of right flank pain.  He aggravated this earlier today when he was vacuuming.  Pain is in the right flank and lateral abdominal wall region.  Pain is aggravated by side-to-side twisting, bending, and stooping.  He is on chronic Coumadin and has taken only Tylenol  Allergies (verified): No Known Drug Allergies  Past History:  Past Medical History: Reviewed history from 05/11/2009 and no changes required. DJD Coronary artery disease (s/p inferior MI April 2006 with stenting to proximal and distal RCA) Diverticulosis, colon GERD Hypertension Benign prostatic hypertrophy Depression Anticoagulation therapy Atrial fibrillation s/p cardioversion Sleep Apnea (on CPAP) Mitral regurgitation Hypothyroidism  Review of Systems       The [name] complains of abdominal pain.  The [name] denies anorexia, fever, weight loss, weight gain, vision loss, decreased hearing, hoarseness, chest pain, syncope, dyspnea on exertion, peripheral edema, prolonged cough, headaches, hemoptysis, melena, hematochezia, severe indigestion/heartburn, hematuria, incontinence, genital sores, muscle weakness, suspicious skin lesions, transient blindness, difficulty walking, depression, unusual weight change, abnormal bleeding, enlarged lymph nodes, angioedema, breast masses, and testicular masses.    Physical  Exam  General:  Well-developed,well-nourished,in no acute distress; alert,appropriate and cooperative throughout examination;slightly uncomfortable, but in no acute distress Neck:  No deformities, masses, or tenderness noted. Lungs:  Normal respiratory effort, chest expands symmetrically. Lungs are clear to auscultation, no crackles or wheezes. Heart:  Normal rate and regular rhythm. S1 and S2 normal without gallop, murmur, click, rub or other extra sounds. Abdomen:  Bowel sounds positive,abdomen soft and non-tender without masses, organomegaly or hernias noted. Msk:  mild tenderness in the right costovertebral angle.  Also, some tenderness along the right costal margin anterolaterally   Impression & Recommendations:  Problem # 1:  BACK PAIN (ICD-724.5)  His updated medication list for this problem includes:    Adult Aspirin Ec Low Strength 81 Mg Tbec (Aspirin) .[name]... 1 once daily    Tramadol Hcl 50 Mg Tabs (Tramadol hcl) ..... One every 6 hours as needed for pain  Problem # 2:  ANTICOAGULATION THERAPY (ICD-V58.61)  Problem # 3:  ATRIAL FIBRILLATION (ICD-427.31)  His updated medication list for this problem includes:    Amiodarone Hcl 200 Mg Tabs (Amiodarone hcl) .[name]... 1 once daily    Norvasc 5 Mg Tabs (Amlodipine besylate) ..... One daily    Adult Aspirin Ec Low Strength 81 Mg Tbec (Aspirin) .[name]... 1 once daily    Coumadin 5 Mg Tabs (Warfarin sodium) .[name]... Take as directed by coumadin clinic.  Complete Medication List: 1)  Levothyroxine Sodium 25 Mcg Tabs (Levothyroxine sodium) .[name].. 1 once daily 2)  Protonix 40 Mg Tbec (Pantoprazole sodium) .... 2 daily 3)  Clonazepam 1 Mg Tabs (Clonazepam) .[name].. 1  at bedtime 4)  Amiodarone Hcl 200 Mg  Tabs (Amiodarone hcl) .[name].. 1 once daily 5)  Trazodone Hcl 50 Mg Tabs (Trazodone hcl) .... One  at bedtime 6)  Norvasc 5 Mg Tabs (Amlodipine besylate) .... One daily 7)  Pravastatin Sodium 80 Mg Tabs (Pravastatin sodium) .... One daily 8)  Adult  Aspirin Ec Low Strength 81 Mg Tbec (Aspirin) .[name].. 1 once daily 9)  Mvi  .... One by mouth daily 10)  Coq10  .... One by mouth daily 11)  Coumadin 5 Mg Tabs (Warfarin sodium) .... Take as directed by coumadin clinic. 12)  Nitrostat 0.4 Mg Subl (Nitroglycerin) .... As needed 13)  Prednisone 5 Mg Tabs (Prednisone) .... One twice daily 14)  Tramadol Hcl 50 Mg Tabs (Tramadol hcl) .... One every 6 hours as needed for pain  [name] Instructions: 1)  Please schedule a follow-up appointment in 3 months. 2)  notify the office if unimproved Prescriptions: TRAMADOL HCL 50 MG TABS (TRAMADOL HCL) one every 6 hours as needed for pain  #50 x 2   Entered and Authorized by:   Gordy Savers  MD   Signed by:   Gordy Savers  MD on 09/16/2009   Method used:   Print then Give to [name]   RxID:   [id number] PREDNISONE 5 MG TABS (PREDNISONE) one twice daily  #20 x 0   Entered and Authorized by:   Gordy Savers  MD   Signed by:   Gordy Savers  MD on 09/16/2009   Method used:   Print then Give to [name]   RxID:   [id number]

## 2010-08-18 NOTE — Assessment & Plan Note (Signed)
Summary: Pneumonia/pts been in hosp/cjr   Vital Signs:  Patient profile:   77 year old male Weight:      186 pounds O2 Sat:      97 % on Room air Temp:     98.0 degrees F oral BP sitting:   130 / 80  (right arm) Cuff size:   regular  Vitals Entered By: Duard Brady LPN (Nov 29, 2009 11:20 AM)  O2 Flow:  Room air CC: f/u past cardio appt per cariologist request   - post hostpital ?  Is Patient Diabetic? No   Primary Care Provider:  Gordy Savers  MD  CC:  f/u past cardio appt per cariologist request   - post hostpital ? .  History of Present Illness: 77 year old patient who is seen today, post hospital discharge for pneumonia and sepsis syndrome.  He remains quite weak, but doing remarkably well.  He is followed closely by cardiology.  His coronary artery disease, hypertension, paroxysmal atrial fibrillation.  He remains on amiodarone the Coumadin anticoagulation has been held due to a hematoma in his abdominal wall resulting from a fall.  At the present time.  He is on aspirin 81 mg daily.  He continues to improve daily.  He still uses a 4-point walker;  hospital records reviewed  Allergies (verified): No Known Drug Allergies  Past History:  Past Medical History: Reviewed history from 11/25/2009 and no changes required. DJD Coronary artery disease (s/p inferior MI April 2006 with stenting to proximal and distal RCA) Diverticulosis, colon GERD Hypertension Benign prostatic hypertrophy Depression Anticoagulation therapy Atrial fibrillation s/p cardioversion Sleep Apnea (on CPAP) Mitral regurgitation Hypothyroidism Abdominal wall hematoma  Review of Systems       The patient complains of dyspnea on exertion, muscle weakness, difficulty walking, and depression.  The patient denies anorexia, fever, weight loss, weight gain, vision loss, decreased hearing, hoarseness, chest pain, syncope, peripheral edema, prolonged cough, headaches, hemoptysis, abdominal pain,  melena, hematochezia, severe indigestion/heartburn, hematuria, incontinence, genital sores, suspicious skin lesions, transient blindness, unusual weight change, abnormal bleeding, enlarged lymph nodes, angioedema, breast masses, and testicular masses.    Physical Exam  General:  frail but alert and in no distress.  Blood pressure 130/78 Mouth:  Oral mucosa and oropharynx without lesions or exudates.  Teeth in good repair. Neck:  No deformities, masses, or tenderness noted. Lungs:  Normal respiratory effort, chest expands symmetrically. Lungs are clear to auscultation, no crackles or wheezes. O2 saturation 97% Heart:  Normal rate and regular rhythm. S1 and S2 normal without gallop, murmur, click, rub or other extra sounds. Abdomen:  Bowel sounds positive,abdomen soft and non-tender without masses, organomegaly or hernias noted. Msk:  No deformity or scoliosis noted of thoracic or lumbar spine.   Extremities:  no edema   Impression & Recommendations:  Problem # 1:  WEAKNESS (ICD-780.79)  Orders: Venipuncture (16109) TLB-CBC Platelet - w/Differential (85025-CBCD) TLB-BMP (Basic Metabolic Panel-BMET) (80048-METABOL)  Problem # 2:  HYPERLIPIDEMIA (ICD-272.4)  His updated medication list for this problem includes:    Pravastatin Sodium 80 Mg Tabs (Pravastatin sodium) ..... One daily  His updated medication list for this problem includes:    Pravastatin Sodium 80 Mg Tabs (Pravastatin sodium) ..... One daily  Problem # 3:  MITRAL REGURGITATION (ICD-396.3)  His updated medication list for this problem includes:    Amiodarone Hcl 200 Mg Tabs (Amiodarone hcl) .[name]... 1 once daily    Adult Aspirin Ec Low Strength 81 Mg Tbec (Aspirin) .[name]... 1 once  daily  His updated medication list for this problem includes:    Amiodarone Hcl 200 Mg Tabs (Amiodarone hcl) .[name]... 1 once daily    Adult Aspirin Ec Low Strength 81 Mg Tbec (Aspirin) .[name]... 1 once daily  Problem # 4:  ATRIAL FIBRILLATION  (ICD-427.31)  His updated medication list for this problem includes:    Amiodarone Hcl 200 Mg Tabs (Amiodarone hcl) .[name]... 1 once daily    Norvasc 5 Mg Tabs (Amlodipine besylate) ..... One daily    Adult Aspirin Ec Low Strength 81 Mg Tbec (Aspirin) .[name]... 1 once daily  His updated medication list for this problem includes:    Amiodarone Hcl 200 Mg Tabs (Amiodarone hcl) .[name]... 1 once daily    Norvasc 5 Mg Tabs (Amlodipine besylate) ..... One daily    Adult Aspirin Ec Low Strength 81 Mg Tbec (Aspirin) .[name]... 1 once daily  Complete Medication List: 1)  Levothyroxine Sodium 25 Mcg Tabs (Levothyroxine sodium) .[name].. 1 once daily 2)  Protonix 40 Mg Tbec (Pantoprazole sodium) .... 2 daily 3)  Clonazepam 1 Mg Tabs (Clonazepam) .[name].. 1  at bedtime 4)  Amiodarone Hcl 200 Mg Tabs (Amiodarone hcl) .[name].. 1 once daily 5)  Trazodone Hcl 50 Mg Tabs (Trazodone hcl) .... One  at bedtime 6)  Norvasc 5 Mg Tabs (Amlodipine besylate) .... One daily 7)  Pravastatin Sodium 80 Mg Tabs (Pravastatin sodium) .... One daily 8)  Adult Aspirin Ec Low Strength 81 Mg Tbec (Aspirin) .[name].. 1 once daily 9)  Mvi  .... One by mouth daily 10)  Coq10  .... One by mouth daily 11)  Nitrostat 0.4 Mg Subl (Nitroglycerin) .... As needed 12)  Hydrocodone-acetaminophen 5-500 Mg Tabs (Hydrocodone-acetaminophen) .... One every 6 hours as needed for pain 13)  Proventil Hfa 108 (90 Base) Mcg/act Aers (Albuterol sulfate) .... As needed 14)  Reglan 5 Mg Tabs (Metoclopramide hcl) .[name].. 1 qac and at bedtime  Patient Instructions: 1)  Please schedule a follow-up appointment in 1 month. 2)  Limit your Sodium (Salt). 3)  It is important that you exercise regularly at least 20 minutes 5 times a week. If you develop chest pain, have severe difficulty breathing, or feel very tired , stop exercising immediately and seek medical attention.

## 2010-08-18 NOTE — Miscellaneous (Signed)
Summary: Face to Face Encounter/[facility]  Face to Face Encounter/[facility]   Imported By: Maryln Gottron 12/22/2009 10:48:42  _____________________________________________________________________  External Attachment:    Type:   Image     Comment:   External Document

## 2010-08-18 NOTE — Assessment & Plan Note (Signed)
Summary: weakness/njr   Vital Signs:  Patient profile:   77 year old male Weight:      198 pounds Temp:     99.5 degrees F oral  Vitals Entered By: Duard Brady LPN (November 09, 2009 11:38 AM) CC: c/o chills ,weakness x 3days , fell x 3 last night , c/o nausea , no v/d Is Patient Diabetic? No   Primary Care Provider:  Gordy Savers  MD  CC:  c/o chills , weakness x 3days , fell x 3 last night , c/o nausea , and no v/d.  History of Present Illness: 77 year old patient who is seen today with a chief complaint of profound weakness.  He is still slightly unwell for about one week, but over the past 3 days has become much weaker.  This has been associated with fever, chills, and sweats.  Other new complaints include the frequent nocturia without dysuria.  This morning he developed a mild nonproductive cough.  Last night due to profound weakness, he fell on 3 separate occasions.   The patient is now admitted for further evaluation and treatment of his profound weakness and to rule out sepsis.  denies any headache, stiff neck or  purulent  sputum production.  Allergies: No Known Drug Allergies  Past History:  Past Medical History: Reviewed history from 05/11/2009 and no changes required. DJD Coronary artery disease (s/p inferior MI April 2006 with stenting to proximal and distal RCA) Diverticulosis, colon GERD Hypertension Benign prostatic hypertrophy Depression Anticoagulation therapy Atrial fibrillation s/p cardioversion Sleep Apnea (on CPAP) Mitral regurgitation Hypothyroidism  Past Surgical History: Reviewed history from 05/11/2009 and no changes required. Transurethral resection of prostate  Family History: Reviewed history from 04/10/2007 and no changes required. Family History of CAD Male 1st degree relative <50 Family History Hypertension  Social History: Reviewed history from 04/10/2007 and no changes required. Retired Married Never Smoked Alcohol  use-no  Review of Systems       The patient complains of anorexia, fever, decreased hearing, dyspnea on exertion, muscle weakness, difficulty walking, and depression.  The patient denies weight loss, weight gain, vision loss, hoarseness, chest pain, syncope, peripheral edema, prolonged cough, headaches, hemoptysis, abdominal pain, melena, hematochezia, severe indigestion/heartburn, hematuria, incontinence, genital sores, suspicious skin lesions, transient blindness, unusual weight change, abnormal bleeding, enlarged lymph nodes, angioedema, breast masses, and testicular masses.    Physical Exam  General:  alert profoundly weak with chills; blood pressure 130/80 pulse 72 Head:  Normocephalic and atraumatic without obvious abnormalities. No apparent alopecia or balding. Eyes:  No corneal or conjunctival inflammation noted. EOMI. Perrla. Funduscopic exam benign, without hemorrhages, exudates or papilledema. Vision grossly normal. Ears:  hearing aids in place bilaterally; membranes and canals clear Nose:  External nasal examination shows no deformity or inflammation. Nasal mucosa are pink and moist without lesions or exudates. Mouth:  Oral mucosa and oropharynx without lesions or exudates.  Neck:  No deformities, masses, or tenderness noted. Chest Wall:  No deformities, masses, tenderness or gynecomastia noted. Breasts:  No masses or gynecomastia noted Lungs:  few crackles, right base O2 saturation 90 to 94% Heart:  Normal rate and regular rhythm. S1 and S2 normal without gallop, murmur, click, rub or other extra sounds. Abdomen:  obese soft and nontender.  No guarding.  Bowel sounds normal Genitalia:  Testes bilaterally descended without nodularity, tenderness or masses. No scrotal masses or lesions. No penis lesions or urethral discharge. Msk:  No deformity or scoliosis noted of thoracic or lumbar spine.  Pulses:  R and L carotid,radial,femoral,dorsalis pedis and posterior tibial pulses are  full and equal bilaterally Extremities:  No clubbing, cyanosis, edema, or deformity noted with normal full range of motion of all joints.   Neurologic:  alert & oriented X3, cranial nerves II-XII intact, strength normal in all extremities, and sensation intact to pinprick.   requires assistance with ambulation Skin:  warm and dry without rash Cervical Nodes:  No lymphadenopathy noted Axillary Nodes:  No palpable lymphadenopathy Inguinal Nodes:  No significant adenopathy Psych:  Cognition and judgment appear intact. Alert and cooperative with normal attention span and concentration. No apparent delusions, illusions, hallucinations   Impression & Recommendations:  Problem # 1:  FEVER, HX OF (ICD-V15.9) patient will be admitted to hospital to rule out sepsis.  Blood and urine cultures will be obtained and a chest x-ray reviewed.  Antibiotic therapy will be held pending review of the above  Problem # 2:  WEAKNESS (ICD-780.79)  Problem # 3:  ANTICOAGULATION THERAPY (ICD-V58.61)  Problem # 4:  HYPOTHYROIDISM (ICD-244.9)  His updated medication list for this problem includes:    Levothyroxine Sodium 25 Mcg Tabs (Levothyroxine sodium) .[name]... 1 once daily  Problem # 5:  HYPERTENSION (ICD-401.9)  His updated medication list for this problem includes:    Norvasc 5 Mg Tabs (Amlodipine besylate) ..... One daily  Problem # 6:  ATRIAL FIBRILLATION (ICD-427.31)  His updated medication list for this problem includes:    Amiodarone Hcl 200 Mg Tabs (Amiodarone hcl) .[name]... 1 once daily    Norvasc 5 Mg Tabs (Amlodipine besylate) ..... One daily    Adult Aspirin Ec Low Strength 81 Mg Tbec (Aspirin) .[name]... 1 once daily    Coumadin 5 Mg Tabs (Warfarin sodium) .[name]... Take as directed by coumadin clinic.  Complete Medication List: 1)  Levothyroxine Sodium 25 Mcg Tabs (Levothyroxine sodium) .[name].. 1 once daily 2)  Protonix 40 Mg Tbec (Pantoprazole sodium) .... 2 daily 3)  Clonazepam 1 Mg Tabs (Clonazepam)  .[name].. 1  at bedtime 4)  Amiodarone Hcl 200 Mg Tabs (Amiodarone hcl) .[name].. 1 once daily 5)  Trazodone Hcl 50 Mg Tabs (Trazodone hcl) .... One  at bedtime 6)  Norvasc 5 Mg Tabs (Amlodipine besylate) .... One daily 7)  Pravastatin Sodium 80 Mg Tabs (Pravastatin sodium) .... One daily 8)  Adult Aspirin Ec Low Strength 81 Mg Tbec (Aspirin) .[name].. 1 once daily 9)  Mvi  .... One by mouth daily 10)  Coq10  .... One by mouth daily 11)  Coumadin 5 Mg Tabs (Warfarin sodium) .... Take as directed by coumadin clinic. 12)  Nitrostat 0.4 Mg Subl (Nitroglycerin) .... As needed 13)  Prednisone 5 Mg Tabs (Prednisone) .... One twice daily 14)  Hydrocodone-acetaminophen 5-500 Mg Tabs (Hydrocodone-acetaminophen) .... One every 6 hours as needed for pain  Patient Instructions: 1)  report to the hospital for admission  in length and and

## 2010-08-18 NOTE — Assessment & Plan Note (Signed)
Summary: per check out/saf      Allergies Added: NKDA  Visit Type:  Follow-up Primary Provider:  Gordy Savers  MD  CC:  MR.  History of Present Illness: The patient presents for followup of chest pain, atrial arrhythmias and mitral regurgitation. I saw him in the fall and he had some chest discomfort. A stress perfusion study however demonstrated no ischemia or infarct. He did have a followup echocardiogram in December and I reviewed this with him today. His mitral regurgitation was listed as only mild. His EF was well preserved. He has no new cardiovascular complaints. He has somatic complaints and a depressed affect but this has been for some time. He still does try to make it to the maintenance phase of cardiac rehabilitation. He does not report any acute dyspnea and is not describing any PND or orthopnea. He is not having any new palpitations, presyncope or syncope. He has no substernal chest pressure. He does occasionally get a headache in the right frontal area. This may last for a few seconds or longer and he'll take some over-the-counter pain reliever. He's not had any acute neurologic findings however such as loss of voice or motor. He's had no acute visual disturbances.  Current Medications (verified): 1)  Levothyroxine Sodium 25 Mcg  Tabs (Levothyroxine Sodium) .[name].. 1 Once Daily 2)  Protonix 40 Mg  Tbec (Pantoprazole Sodium) .... 2 Daily 3)  Clonazepam 1 Mg  Tabs (Clonazepam) .[name].. 1  At Bedtime 4)  Amiodarone Hcl 200 Mg  Tabs (Amiodarone Hcl) .[name].. 1 Once Daily 5)  Trazodone Hcl 50 Mg  Tabs (Trazodone Hcl) .... One  At Bedtime 6)  Norvasc 5 Mg  Tabs (Amlodipine Besylate) .... One Daily 7)  Pravastatin Sodium 80 Mg  Tabs (Pravastatin Sodium) .... One Daily 8)  Adult Aspirin Ec Low Strength 81 Mg Tbec (Aspirin) .[name].. 1 Once Daily 9)  Mvi .... One By Mouth Daily 10)  Coq10 .... One By Mouth Daily 11)  Coumadin 5 Mg Tabs (Warfarin Sodium) .... Take As Directed By Coumadin  Clinic. 12)  Nitrostat 0.4 Mg Subl (Nitroglycerin) .... As Needed  Allergies (verified): No Known Drug Allergies  Past History:  Past Medical History: Reviewed history from 05/11/2009 and no changes required. DJD Coronary artery disease (s/p inferior MI April 2006 with stenting to proximal and distal RCA) Diverticulosis, colon GERD Hypertension Benign prostatic hypertrophy Depression Anticoagulation therapy Atrial fibrillation s/p cardioversion Sleep Apnea (on CPAP) Mitral regurgitation Hypothyroidism  Past Surgical History: Reviewed history from 05/11/2009 and no changes required. Transurethral resection of prostate  Review of Systems       As stated in the HPI and negative for all other systems.   Vital Signs:  Patient profile:   77 year old male Height:      72 inches Weight:      205 pounds BMI:     27.90 Pulse rate:   67 / minute Resp:     16 per minute BP sitting:   127 / 72  (right arm)  Vitals Entered By: Marrion Coy, CNA (August 03, 2009 9:27 AM)  Physical Exam  General:  Well developed, well nourished, in no acute distress. Head:  normocephalic and atraumatic Eyes:  PERRLA/EOM intact; conjunctiva and lids normal. Mouth:  Teeth, gums and palate normal. Oral mucosa normal. Neck:  Neck supple, no JVD. No masses, thyromegaly or abnormal cervical nodes. Chest Wall:  no deformities or breast masses noted Lungs:  Clear bilaterally to auscultation and percussion.  Abdomen:  Bowel sounds positive; abdomen soft and non-tender without masses, organomegaly, or hernias noted. No hepatosplenomegaly. Msk:  Back normal, normal gait. Muscle strength and tone normal. Neurologic:  Alert and oriented x 3. Skin:  Intact without lesions or rashes. Cervical Nodes:  no significant adenopathy Axillary Nodes:  no significant adenopathy Inguinal Nodes:  no significant adenopathy Psych:  depressed affect.     Detailed Cardiovascular Exam  Neck    Carotids: Carotids  full and equal bilaterally without bruits.      Neck Veins: Normal, no JVD.    Heart    Inspection: no deformities or lifts noted.      Palpation: normal PMI with no thrills palpable.      Auscultation: regular rate and rhythm, S1, S2 without murmurs, rubs, gallops, or clicks.    Vascular    Abdominal Aorta: no palpable masses, pulsations, or audible bruits.      Femoral Pulses: normal femoral pulses bilaterally.      Pedal Pulses: normal pedal pulses bilaterally.      Radial Pulses: normal radial pulses bilaterally.      Peripheral Circulation: no clubbing, cyanosis, or edema noted with normal capillary refill.     Impression & Recommendations:  Problem # 1:  CHEST PAIN (ICD-786.50) He had a recent negative stress perfusion study. No further cardiovascular testing is suggested. He will continue with risk reduction.  Problem # 2:  MITRAL REGURGITATION (ICD-396.3) This is mild and can be followed clinically.  Problem # 3:  ATRIAL FIBRILLATION (ICD-427.31) He he is maintaining sinus rhythm on amiodarone. He is to have labs drawn soon at the [facility]. I will send a note asking for thyroid studies and a liver profile. He is up-to-date on ophthalmologic exams. I think following his lung exam with pulmonary function testing would probably be difficult to as I think his effort her understanding would be compromised. I will then get a chest x-ray to followup the amiodarone. Orders: T-2 View CXR ([id number])  Patient Instructions: 1)  Your physician recommends that you schedule a follow-up appointment in:  1 year 2)  Your physician recommends that you continue on your current medications as directed. Please refer to the Current Medication list given to you today. 3)  You have been diagnosed with Congestive Heart Failure or CHF.  CHF is a condition in which a problem with the structure or function of the heart impairs its ability to supply sufficient blood flow to meet the body's needs.   For further information please visit www.cardiosmart.org for detailed information on CHF. 4)  Your physician recommends that you weigh, daily, at the same time every day, and in the same amount of clothing.  Please record your daily weights on the handout provided and bring it to your next appointment. 5)  Your physician recommends that you have blood work at the [facility] profile.

## 2010-08-18 NOTE — Progress Notes (Signed)
Summary: pain worse    Caller: Patient Call For: Gordy Savers  MD Summary of Call: pain in back worse , vicodan took the edge off enough to be able to get some sleep last night, but upon rising this AM , pain worse.  please advise. Initial call taken by: Duard Brady LPN,  September 22, 2009 9:00 AM    Additional Follow-up for Phone Call Additional follow up Details #2::    noted kik Follow-up by: Duard Brady LPN,  September 22, 2009 9:42 AM  Discussed with patient- advised to go to ED for further diagnostic tests

## 2010-08-18 NOTE — Miscellaneous (Signed)
Summary: Orders Update  Clinical Lists Changes  Orders: Added new Service order of EKG w/ Interpretation ([id number]) - Signed 

## 2010-08-18 NOTE — Miscellaneous (Signed)
Summary: [facility] Progress Note  [facility] Progress Note   Imported By: Roderic Ovens 09/30/2009 14:28:48  _____________________________________________________________________  External Attachment:    Type:   Image     Comment:   External Document

## 2010-08-18 NOTE — Assessment & Plan Note (Signed)
Summary: right breast pain and swelling/dm   Vital Signs:  Patient profile:   77 year old male Weight:      195 pounds Temp:     97.7 degrees F oral BP sitting:   120 / 72  (right arm) Cuff size:   regular  Vitals Entered By: Duard Brady LPN (March 23, 2010 11:27 AM) CC: c/o (R) breast pain - still with pneumonia sx Is Patient Diabetic? No Flu Vaccine Consent Questions     Do you have a history of severe allergic reactions to this vaccine? no    Any prior history of allergic reactions to egg and/or gelatin? no    Do you have a sensitivity to the preservative Thimersol? no    Do you have a past history of Guillan-Barre Syndrome? no    Do you currently have an acute febrile illness? no    Have you ever had a severe reaction to latex? no    Vaccine information given and explained to patient? yes    Are you currently pregnant? no    Lot Number:[id number]   Exp Date:01/14/2011   Site Given  Left Deltoid IM   Primary Care Provider:  Gordy Savers  MD  CC:  c/o (R) breast pain - still with pneumonia sx.  History of Present Illness: 77 year old patient who has a two week history of some tenderness and possibly slight enlargement of the right breast.  Medical regimen includes protonix, amiodarone, and Norvasc.  There is been no trauma.  He has atrial for ablation, which has been well controlled on his present regimen.  He remained slightly weak, but in general seemed quite stable  Allergies (verified): No Known Drug Allergies  Physical Exam  General:  Well-developed,well-nourished,in no acute distress; alert,appropriate and cooperative throughout examination Neck:  No deformities, masses, or tenderness noted. Breasts:  the right breast was slightly tender to touch.  The breast tissue on the right was slightly more prominent. Lungs:  Normal respiratory effort, chest expands symmetrically. Lungs are clear to auscultation, no crackles or wheezes. Heart:  Normal rate  and regular rhythm. S1 and S2 normal without gallop, murmur, click, rub or other extra sounds.   Impression & Recommendations:  Problem # 1:  GYNECOMASTIA, UNILATERAL (ICD-611.1) the patient probably has very mild gynecomastia.  This most likely is related to amiodarone; other possibilities include Norvasc(nifedipine), and protonix (omeprazole)  Complete Medication List: 1)  Levothyroxine Sodium 25 Mcg Tabs (Levothyroxine sodium) .[name].. 1 once daily 2)  Protonix 40 Mg Tbec (Pantoprazole sodium) .... 2 daily 3)  Clonazepam 1 Mg Tabs (Clonazepam) .[name].. 1  at bedtime 4)  Amiodarone Hcl 200 Mg Tabs (Amiodarone hcl) .[name].. 1 once daily 5)  Trazodone Hcl 50 Mg Tabs (Trazodone hcl) .... One  at bedtime 6)  Norvasc 5 Mg Tabs (Amlodipine besylate) .... One daily 7)  Pravastatin Sodium 80 Mg Tabs (Pravastatin sodium) .... One daily 8)  Adult Aspirin Ec Low Strength 81 Mg Tbec (Aspirin) .[name].. 1 once daily 9)  Mvi  .... One by mouth daily 10)  Coq10  .... One by mouth daily 11)  Nitrostat 0.4 Mg Subl (Nitroglycerin) .... As needed 12)  Hydrocodone-acetaminophen 5-500 Mg Tabs (Hydrocodone-acetaminophen) .... One every 6 hours as needed for pain 13)  Proventil Hfa 108 (90 Base) Mcg/act Aers (Albuterol sulfate) .... As needed  Other Orders: Flu Vaccine 16yrs + MEDICARE PATIENTS ([id number]) Administration Flu vaccine - MCR (U0454)  Patient Instructions: 1)  Take 400-600mg  of Ibuprofen (Advil,  Motrin) with food every 4-6 hours as needed for relief of pain or comfort of fever. 2)  call if  symptoms worsen or fail to improve

## 2010-08-18 NOTE — Medication Information (Signed)
Summary: rov/cb  Anticoagulant Therapy  Managed by: Weston Brass, PharmD Referring MD: Rollene Rotunda MD PCP: Gordy Savers  MD Supervising MD: Gala Romney MD, Reuel Boom Indication 1: Atrial Fibrillation (ICD-427.31) Lab Used: [facility] Site: [facility] INR POC 3.0 INR RANGE 2 - 3  Dietary changes: no    Health status changes: no    Bleeding/hemorrhagic complications: yes       Details: has large bruise on leg but pt reports it is getting better   Recent/future hospitalizations: no    Any changes in medication regimen? no    Recent/future dental: no  Any missed doses?: no       Is patient compliant with meds? yes       Allergies: No Known Drug Allergies  Anticoagulation Management History:      The patient is taking warfarin and comes in today for a routine follow up visit.  Positive risk factors for bleeding include an age of 75 years or older.  The bleeding index is 'intermediate risk'.  Positive CHADS2 values include History of HTN and Age > 16 years old.  The start date was 07/07/2003.  His last INR was 3.1.  Anticoagulation responsible provider: Lucianne Smestad MD, Reuel Boom.  INR POC: 3.0.  Cuvette Lot#: [id number].  Exp: 11/2010.    Anticoagulation Management Assessment/Plan:      The patient's current anticoagulation dose is Coumadin 5 mg tabs: Take as directed by coumadin clinic..  The target INR is 2 - 3.  The next INR is due 12/02/2009.  Anticoagulation instructions were given to patient.  Results were reviewed/authorized by Weston Brass, PharmD.  He was notified by Weston Brass PharmD.         Prior Anticoagulation Instructions: INR 2.8. Take 0.5 tablet daily except 1 tablet on Tues, Thurs, Sat.  Current Anticoagulation Instructions: INR 3.0  Take 1/2 tablet today then resume same dose of 1/2 tablet every day except 1 tablet on Tuesday, Thursday, and Saturday

## 2010-08-18 NOTE — Progress Notes (Signed)
Summary: Appt needed with Dr. Craige Cotta for OSA follow-up  Phone Note Outgoing Call   Call placed by: Michel Bickers CMA,  October 26, 2009 7:56 AM Call placed to: Patient Summary of Call: Patient last seen 04/2007 by Dr. Craige Cotta.  CMN received from [facility] and patient will need an appt first before VS will sign.  LMOMTCB.Michel Bickers [facility]  October 26, 2009 9:18 AM Initial call taken by: Michel Bickers CMA,  October 26, 2009 7:59 AM  Follow-up for Phone Call        Scheduled appt for 5/10 @ 10a  Noted.Michel Bickers [facility]  October 27, 2009 3:05 PM Follow-up by: Darletta Moll,  October 27, 2009 10:11 AM

## 2010-08-18 NOTE — Medication Information (Signed)
Summary: rov/eac  Anticoagulant Therapy  Managed by: Elaina Pattee, PharmD Referring MD: Rollene Rotunda MD PCP: Gordy Savers  MD Supervising MD: Graciela Husbands MD, Viviann Spare Indication 1: Atrial Fibrillation (ICD-427.31) Lab Used: [facility] Site: [facility] INR POC 2.8 INR RANGE 2 - 3  Dietary changes: no    Health status changes: yes       Details: Hip pain improved.  Bleeding/hemorrhagic complications: yes       Details: Nose bleeds have stopped.  Recent/future hospitalizations: no    Any changes in medication regimen? no    Recent/future dental: no  Any missed doses?: no       Is patient compliant with meds? yes       Allergies: No Known Drug Allergies  Anticoagulation Management History:      The patient is taking warfarin and comes in today for a routine follow up visit.  Positive risk factors for bleeding include an age of 75 years or older.  The bleeding index is 'intermediate risk'.  Positive CHADS2 values include History of HTN and Age > 66 years old.  The start date was 07/07/2003.  His last INR was 3.1.  Anticoagulation responsible provider: Graciela Husbands MD, Viviann Spare.  INR POC: 2.8.  Cuvette Lot#: [id number].  Exp: 11/2010.    Anticoagulation Management Assessment/Plan:      The patient's current anticoagulation dose is Coumadin 5 mg tabs: Take as directed by coumadin clinic..  The target INR is 2 - 3.  The next INR is due 11/04/2009.  Anticoagulation instructions were given to patient.  Results were reviewed/authorized by Elaina Pattee, PharmD.  He was notified by Elaina Pattee, PharmD.         Prior Anticoagulation Instructions: INR 3.6  Skip coumadin today.  Then start NEW dosing schedule of 1 tablet on Tuesday, Thursday, and Saturday, and take 1/2 tablet all other days.  Return to clinic in 2 weeks.      Current Anticoagulation Instructions: INR 2.8. Take 0.5 tablet daily except 1 tablet on Tues, Thurs, Sat.

## 2010-08-18 NOTE — Miscellaneous (Signed)
  Clinical Lists Changes  Observations: Added new observation of ECHOINTERP:   - Left ventricle: The cavity size was normal. There was mild focal       basal hypertrophy of the septum. Systolic function was normal. The       estimated ejection fraction was in the range of 55% to 60%. Wall       motion was normal; there were no regional wall motion       abnormalities. Features are consistent with a pseudonormal left       ventricular filling pattern, with concomitant abnormal relaxation       and increased filling pressure (grade 2 diastolic dysfunction).       Doppler parameters are consistent with high ventricular filling       pressure.     - Aortic valve: Trivial regurgitation.     - Mitral valve: Mild regurgitation.     - Left atrium: The atrium was moderately dilated.     - Right ventricle: The cavity size was mildly dilated. Wall       thickness was normal.     - Right atrium: The atrium was moderately dilated.      (06/22/2009 15:02) Added new observation of NUCLEAR NOS: Exercise Capacity: Lexiscan BP Response: Normal blood pressure response. Clinical Symptoms: nausea and flushed ECG Impression: No significant ST segment change suggestive of ischemia. Overall Impression: Normal stress nuclear study. Overall Impression Comments: Normal  (05/19/2009 15:02)      Echocardiogram  Procedure date:  06/22/2009  Findings:        - Left ventricle: The cavity size was normal. There was mild focal       basal hypertrophy of the septum. Systolic function was normal. The       estimated ejection fraction was in the range of 55% to 60%. Wall       motion was normal; there were no regional wall motion       abnormalities. Features are consistent with a pseudonormal left       ventricular filling pattern, with concomitant abnormal relaxation       and increased filling pressure (grade 2 diastolic dysfunction).       Doppler parameters are consistent with high ventricular filling     pressure.     - Aortic valve: Trivial regurgitation.     - Mitral valve: Mild regurgitation.     - Left atrium: The atrium was moderately dilated.     - Right ventricle: The cavity size was mildly dilated. Wall       thickness was normal.     - Right atrium: The atrium was moderately dilated.       Nuclear Study  Procedure date:  05/19/2009  Findings:      Exercise Capacity: Lexiscan BP Response: Normal blood pressure response. Clinical Symptoms: nausea and flushed ECG Impression: No significant ST segment change suggestive of ischemia. Overall Impression: Normal stress nuclear study. Overall Impression Comments: Normal

## 2010-08-18 NOTE — Medication Information (Signed)
Summary: rov/ewj  Anticoagulant Therapy  Managed by: Cloyde Reams Referring MD: Rollene Rotunda MD PCP: Gordy Savers  MD Supervising MD: Eden Emms MD, Theron Arista Indication 1: Atrial Fibrillation (ICD-427.31) Lab Used: [facility] Site: [facility] INR RANGE 2 - 3  Dietary changes: no    Health status changes: no    Bleeding/hemorrhagic complications: no    Recent/future hospitalizations: no    Any changes in medication regimen? no    Recent/future dental: no  Any missed doses?: no       Is patient compliant with meds? yes       Current Medications (verified): 1)  Levothyroxine Sodium 25 Mcg  Tabs (Levothyroxine Sodium) .[name].. 1 Once Daily 2)  Protonix 40 Mg  Tbec (Pantoprazole Sodium) .... 2 Daily 3)  Clonazepam 1 Mg  Tabs (Clonazepam) .[name].. 1  At Bedtime 4)  Amiodarone Hcl 200 Mg  Tabs (Amiodarone Hcl) .[name].. 1 Once Daily 5)  Trazodone Hcl 50 Mg  Tabs (Trazodone Hcl) .... One  At Bedtime 6)  Norvasc 5 Mg  Tabs (Amlodipine Besylate) .... One Daily 7)  Pravastatin Sodium 80 Mg  Tabs (Pravastatin Sodium) .... One Daily 8)  Adult Aspirin Ec Low Strength 81 Mg Tbec (Aspirin) .[name].. 1 Once Daily 9)  Mvi .... One By Mouth Daily 10)  Coq10 .... One By Mouth Daily 11)  Coumadin 5 Mg Tabs (Warfarin Sodium) .... Take As Directed By Coumadin Clinic. 12)  Nitrostat 0.4 Mg Subl (Nitroglycerin) .... As Needed  Allergies (verified): No Known Drug Allergies  Anticoagulation Management History:      The patient is taking warfarin and comes in today for a routine follow up visit.  Positive risk factors for bleeding include an age of 75 years or older.  The bleeding index is 'intermediate risk'.  Positive CHADS2 values include History of HTN and Age > 75 years old.  The start date was 07/07/2003.  His last INR was 3.7 RATIO and today's INR is 3.1.  Anticoagulation responsible provider: Eden Emms MD, Theron Arista.  Cuvette Lot#: [id number].  Exp: 08/2010.    Anticoagulation Management Assessment/Plan:     The patient's current anticoagulation dose is Coumadin 5 mg tabs: Take as directed by coumadin clinic..  The target INR is 2 - 3.  The next INR is due 08/17/2009.  Anticoagulation instructions were given to patient.  Results were reviewed/authorized by Cloyde Reams.  He was notified by Lew Dawes, PharmD Candidate.         Prior Anticoagulation Instructions: INR 2.8  Continue on same dosage 1 tablet daily except 1/2 tablet on Mondays, Wednesdays, and Fridays.   Recheck in 4 weeks.    Current Anticoagulation Instructions: INR 3.1  Take 1/2 tablet today, then continue taking 1 tablet daily except 1/2 tablet on Mondays, Wednesdays, and Fridays. Recheck in 4 weeks.

## 2010-08-18 NOTE — Medication Information (Signed)
Summary: rov/ewj  Anticoagulant Therapy  Managed by: Eda Keys, PharmD Referring MD: Rollene Rotunda MD PCP: Gordy Savers  MD Supervising MD: Gala Romney MD, Reuel Boom Indication 1: Atrial Fibrillation (ICD-427.31) Lab Used: [facility] Site: [facility] INR POC 3.3 INR RANGE 2 - 3  Dietary changes: no    Health status changes: no    Bleeding/hemorrhagic complications: yes       Details: frequent, but minor nose bleeds, pt stopped asa for 4 days per dr. Antoine Poche, then restarted and nose bleeds have been less frequent since then.  Recent/future hospitalizations: no    Any changes in medication regimen? no    Recent/future dental: no  Any missed doses?: no       Is patient compliant with meds? yes       Allergies: No Known Drug Allergies  Anticoagulation Management History:      The patient is taking warfarin and comes in today for a routine follow up visit.  Positive risk factors for bleeding include an age of 75 years or older.  The bleeding index is 'intermediate risk'.  Positive CHADS2 values include History of HTN and Age > 53 years old.  The start date was 07/07/2003.  His last INR was 3.1.  Anticoagulation responsible provider: Harika Laidlaw MD, Reuel Boom.  INR POC: 3.3.  Cuvette Lot#: [id number].  Exp: 10/2010.    Anticoagulation Management Assessment/Plan:      The patient's current anticoagulation dose is Coumadin 5 mg tabs: Take as directed by coumadin clinic..  The target INR is 2 - 3.  The next INR is due 10/05/2009.  Anticoagulation instructions were given to patient.  Results were reviewed/authorized by Eda Keys, PharmD.  He was notified by Eda Keys.         Prior Anticoagulation Instructions: INR 2.8  Continue on same dosage 1 tablet daily except 1/2 tablet on Mondays, Wednesdays, and Fridays.  Recheck in 4 weeks.    Current Anticoagulation Instructions: INR 3.3  Do NOT take coumadin today.  Then restart normal dosing schedule of 1/2 tablet  on Monday, Wednesday, and Friday and 1 tablet all other days.  Return to clinic in 3 weeks.

## 2010-08-18 NOTE — Assessment & Plan Note (Signed)
Summary: eph. c/o weakness./ gd  Medications Added PROVENTIL HFA 108 (90 BASE) MCG/ACT AERS (ALBUTEROL SULFATE) as needed REGLAN 5 MG TABS (METOCLOPRAMIDE HCL) 1 qac and at bedtime      Allergies Added: NKDA  Visit Type:  Follow-up Primary Provider:  Gordy Savers  MD  CC:  CAD/Atrial Fib.  History of Present Illness: The patient presents for followup after a recent hospitalization. He was treated for a pneumonia. He was also seen by Dr. Delton Coombes who discussed his case with me. He thought that there clearly was a component of heart failure. BNP was slightly elevated and chest x-ray was suggestive of heart failure. He did have some diuresis. He had an echocardiogram which demonstrated a well-preserved ejection fraction and only mild mitral regurgitation. He has a very large left atrium. Of note he developed bleeding in the abdominal wall and had to be taken off of Coumadin.  Since discharge she has been profoundly weak which is an increase above his baseline weakness. He's not complaining however of significant shortness of breath. He's had only a mild cough. He is not describing new PND or orthopnea. He is not describing no palpitations or presyncope. He is not describing new weight gain or swelling. The abdominal pain he had related to his hematoma is resolved.  Current Medications (verified): 1)  Levothyroxine Sodium 25 Mcg  Tabs (Levothyroxine Sodium) .[name].. 1 Once Daily 2)  Protonix 40 Mg  Tbec (Pantoprazole Sodium) .... 2 Daily 3)  Clonazepam 1 Mg  Tabs (Clonazepam) .[name].. 1  At Bedtime 4)  Amiodarone Hcl 200 Mg  Tabs (Amiodarone Hcl) .[name].. 1 Once Daily 5)  Trazodone Hcl 50 Mg  Tabs (Trazodone Hcl) .... One  At Bedtime 6)  Norvasc 5 Mg  Tabs (Amlodipine Besylate) .... One Daily 7)  Pravastatin Sodium 80 Mg  Tabs (Pravastatin Sodium) .... One Daily 8)  Adult Aspirin Ec Low Strength 81 Mg Tbec (Aspirin) .[name].. 1 Once Daily 9)  Mvi .... One By Mouth Daily 10)  Coq10 .... One By Mouth  Daily 11)  Nitrostat 0.4 Mg Subl (Nitroglycerin) .... As Needed 12)  Hydrocodone-Acetaminophen 5-500 Mg Tabs (Hydrocodone-Acetaminophen) .... One Every 6 Hours As Needed For Pain 13)  Proventil Hfa 108 (90 Base) Mcg/act Aers (Albuterol Sulfate) .... As Needed 14)  Reglan 5 Mg Tabs (Metoclopramide Hcl) .[name].. 1 Qac and At Bedtime  Allergies (verified): No Known Drug Allergies  Past History:  Past Medical History: DJD Coronary artery disease (s/p inferior MI April 2006 with stenting to proximal and distal RCA) Diverticulosis, colon GERD Hypertension Benign prostatic hypertrophy Depression Anticoagulation therapy Atrial fibrillation s/p cardioversion Sleep Apnea (on CPAP) Mitral regurgitation Hypothyroidism Abdominal wall hematoma  Review of Systems       As stated in the HPI and negative for all other systems.   Vital Signs:  Patient profile:   77 year old male Height:      72 inches Weight:      184 pounds BMI:     25.05 Pulse rate:   77 / minute Resp:     16 per minute BP sitting:   118 / 69  (right arm)  Vitals Entered By: Marrion Coy, CNA (Nov 25, 2009 10:21 AM)  Physical Exam  General:  Well developed, well nourished, in no acute distress. Head:  normocephalic and atraumatic Eyes:  PERRLA/EOM intact; conjunctiva and lids normal. Neck:  No jugular venous distention at 90 Chest Wall:  no deformities or breast masses noted Lungs:  clear  to auscultation and percussion bilaterally Heart:  , S1 and S2 within normal limits, no S3, no S4, no clicks, no rubs, no murmurs Abdomen:  Bowel sounds positive; abdomen soft and non-tender without masses, organomegaly, or hernias noted. No hepatosplenomegaly. Msk:  mild diffuse muscle wasting Extremities:  no cyanosis or edema Neurologic:  Alert and oriented x 3. Skin:  Intact without lesions or rashes. Psych:  depressed affect.     EKG  Procedure date:  11/25/2009  Findings:      sinus rhythm, rate 77, incomplete  right bundle branch block, QTC prolonged  Impression & Recommendations:  Problem # 1:  WEAKNESS (ICD-780.79) Assessment Unchanged His weakness is more pronounced since his long hospitalization which is understandable. He is now more anemic than he was previously with his last hemoglobin prior to discharge being 8.8. I think this contributes to the weakness and hopefully this will improve slowly. He is to see his primary physician next week to further discuss this.  Problem # 2:  ANTICOAGULATION THERAPY (ICD-V58.61) He will remain off Coumadin indefinitely given the abdominal wall hematoma.  Problem # 3:  MITRAL REGURGITATION (ICD-396.3) This was mild on recent echo. While certainly some of his shortness of breath was related to edema and heart failure at present is not particularly short of breath has a normal lung exam and no edema. He has mild renal insufficiency. I would prefer not to add diuretics unless he has increasing dyspnea.  Patient Instructions: 1)  Your physician recommends that you schedule a follow-up appointment in: 4 MONTHS WITH DR [facility] 2)  Your physician recommends that you continue on your current medications as directed. Please refer to the Current Medication list given to you today.

## 2010-08-18 NOTE — Assessment & Plan Note (Signed)
Summary: SEVERE BAVK PAIN/RCD   Vital Signs:  Patient profile:   77 year old male BP sitting:   110 / 80  (right arm) Cuff size:   regular  Vitals Entered By: Duard Brady LPN (September 21, 1608 10:22 AM) CC: c/o back pain much worse - difficult to walk Is Patient Diabetic? No   Primary Care Provider:  Gordy Savers  MD  CC:  c/o back pain much worse - difficult to walk.  History of Present Illness: 77 year old patient who is seen today for follow-up of worsening right lumbar back pain.  he has been using analgesics and anti-inflammatory medications, but has worsened today.  He states that he has ongoing pain at rest, which is aggravated by movement.  No constitutional symptoms.  No history of a recent fall.  Denies any pleurisy or shortness of breath  Allergies (verified): No Known Drug Allergies  Past History:  Past Medical History: Reviewed history from 05/11/2009 and no changes required. DJD Coronary artery disease (s/p inferior MI April 2006 with stenting to proximal and distal RCA) Diverticulosis, colon GERD Hypertension Benign prostatic hypertrophy Depression Anticoagulation therapy Atrial fibrillation s/p cardioversion Sleep Apnea (on CPAP) Mitral regurgitation Hypothyroidism  Review of Systems       The patient complains of difficulty walking.  The patient denies anorexia, fever, weight loss, weight gain, vision loss, decreased hearing, hoarseness, chest pain, syncope, dyspnea on exertion, peripheral edema, prolonged cough, headaches, hemoptysis, abdominal pain, melena, hematochezia, severe indigestion/heartburn, hematuria, incontinence, genital sores, muscle weakness, suspicious skin lesions, transient blindness, depression, unusual weight change, abnormal bleeding, enlarged lymph nodes, angioedema, breast masses, and testicular masses.    Physical Exam  General:  uncomfortable in moderate painful distress.  He has a difficult time ambulating and  tends to walk stoops over.  Blood pressure normal Lungs:  Normal respiratory effort, chest expands symmetrically. Lungs are clear to auscultation, no crackles or wheezes. Heart:  Normal rate and regular rhythm. S1 and S2 normal without gallop, murmur, click, rub or other extra sounds. Abdomen:  Bowel sounds positive,abdomen soft and non-tender without masses, organomegaly or hernias noted.  no CVA tenderness Msk:  tenderness was noted in the high lumbar spine area.  This did tend to reproduce the pain with radiation to the right flank area   Impression & Recommendations:  Problem # 1:  BACK PAIN (ICD-724.5)  His updated medication list for this problem includes:    Adult Aspirin Ec Low Strength 81 Mg Tbec (Aspirin) .[name]... 1 once daily    Hydrocodone-acetaminophen 5-500 Mg Tabs (Hydrocodone-acetaminophen) ..... One every 6 hours as needed for pain    His updated medication list for this problem includes:    Adult Aspirin Ec Low Strength 81 Mg Tbec (Aspirin) .[name]... 1 once daily    Hydrocodone-acetaminophen 5-500 Mg Tabs (Hydrocodone-acetaminophen) ..... One every 6 hours as needed for pain  Orders: T-Lumbar Spine 2 Views (72100TC) T-Thoracic Spine 2 Views (96045WU) Depo- Medrol 80mg  (J1040) Admin of Therapeutic Inj  intramuscular or subcutaneous (98119) Demerol  100mg   Injection (J4782) Promethazine up to 50mg  (J2550) Admin of Therapeutic Inj  intramuscular or subcutaneous (95621)   will check x-rays of the lower thoracic and lumbar region to rule out a compression fracture.  If these x-rays are unhelpful will consider CT scan  Complete Medication List: 1)  Levothyroxine Sodium 25 Mcg Tabs (Levothyroxine sodium) .[name].. 1 once daily 2)  Protonix 40 Mg Tbec (Pantoprazole sodium) .... 2 daily 3)  Clonazepam 1 Mg Tabs (  Clonazepam) .[name].. 1  at bedtime 4)  Amiodarone Hcl 200 Mg Tabs (Amiodarone hcl) .[name].. 1 once daily 5)  Trazodone Hcl 50 Mg Tabs (Trazodone hcl) .... One  at bedtime 6)   Norvasc 5 Mg Tabs (Amlodipine besylate) .... One daily 7)  Pravastatin Sodium 80 Mg Tabs (Pravastatin sodium) .... One daily 8)  Adult Aspirin Ec Low Strength 81 Mg Tbec (Aspirin) .[name].. 1 once daily 9)  Mvi  .... One by mouth daily 10)  Coq10  .... One by mouth daily 11)  Coumadin 5 Mg Tabs (Warfarin sodium) .... Take as directed by coumadin clinic. 12)  Nitrostat 0.4 Mg Subl (Nitroglycerin) .... As needed 13)  Prednisone 5 Mg Tabs (Prednisone) .... One twice daily 14)  Hydrocodone-acetaminophen 5-500 Mg Tabs (Hydrocodone-acetaminophen) .... One every 6 hours as needed for pain  Patient Instructions: 1)  x-rays as scheduled   Medication Administration  Injection # 1:    Medication: Depo- Medrol 80mg     Diagnosis: BACK PAIN (ICD-724.5)    Route: IM    Site: LUOQ gluteus    Exp Date: 03/2012    Lot #: [id number]    Mfr: Pharmacia    Patient tolerated injection without complications    Given by: Duard Brady LPN (September 21, 9809 12:18 PM)  Injection # 2:    Medication: Demerol  100mg   Injection    Diagnosis: BACK PAIN (ICD-724.5)    Route: IM    Site: RUOQ gluteus    Exp Date: 09/2010    Lot #: [id number]    Mfr: hospira    Comments: 25 mg given    Patient tolerated injection without complications    Given by: Duard Brady LPN (September 22, 5619 12:21 PM)  Injection # 3:    Medication: Promethazine up to 50mg     Diagnosis: BACK PAIN (ICD-724.5)    Route: IM    Site: RUOQ gluteus    Exp Date: 11/2010    Lot #: [id number]    Mfr: Novartis    Comments: gien with demerol    Patient tolerated injection without complications    Given by: Duard Brady LPN (September 22, 8467 12:25 PM)  Orders Added: 1)  Est. Patient Level III [62952] 2)  T-Lumbar Spine 2 Views [72100TC] 3)  T-Thoracic Spine 2 Views [72070TC] 4)  Depo- Medrol 80mg  [J1040] 5)  Admin of Therapeutic Inj  intramuscular or subcutaneous [96372] 6)  Demerol  100mg   Injection [J2175] 7)  Promethazine up to 50mg   [J2550] 8)  Admin of Therapeutic Inj  intramuscular or subcutaneous [84132]

## 2010-08-18 NOTE — Progress Notes (Signed)
Summary: needs cheaper alternative  Phone Note Call from Patient Call back at [phone]   [facility]er: [facility]---live call Reason for Call: Acute Illness Summary of Call: Generic protonix was just sent to [facility]. pt has no ins. it will cost him $200.00.  Is it ok switch to prilosec, zegerid or prevacid otc?  Initial call taken by: Warnell Forester,  April 19, 2010 11:26 AM  Follow-up for Phone Call        YES Follow-up by: Gordy Savers  MD,  April 19, 2010 11:51 AM  Additional Follow-up for Phone Call Additional follow up Details #1::        pharmacy is aware. Additional Follow-up by: Warnell Forester,  April 19, 2010 1:11 PM

## 2010-08-18 NOTE — Medication Information (Signed)
Summary: Coumadin Clinic  Anticoagulant Therapy  Managed by: Inactive Referring MD: Rollene Rotunda MD PCP: Gordy Savers  MD Supervising MD: [name] MD, Arlys John Indication 1: Atrial Fibrillation (ICD-427.31) Lab Used: [facility] Site: [facility] INR RANGE 2 - 3          Comments: Dr Antoine Poche discontinued coumadin secondary to abdominal wall hematoma. Cloyde Reams RN  February 01, 2010 8:57 AM   Allergies: No Known Drug Allergies  Anticoagulation Management History:      Positive risk factors for bleeding include an age of 4 years or older.  The bleeding index is 'intermediate risk'.  Positive CHADS2 values include History of HTN and Age > 75 years old.  The start date was 07/07/2003.  His last INR was 3.1.  Anticoagulation responsible provider: [name] MD, Arlys John.  Exp: 11/2010.    Anticoagulation Management Assessment/Plan:      [facility] 2 - 3.  The next INR is due 12/02/2009.  Anticoagulation instructions were given to patient.  Results were reviewed/authorized by Inactive.         Prior Anticoagulation Instructions: INR 3.0  Take 1/2 tablet today then resume same dose of 1/2 tablet every day except 1 tablet on Tuesday, Thursday, and Saturday

## 2010-08-18 NOTE — Miscellaneous (Signed)
Summary: Certification and Plan of Care/[facility]  Certification and Plan of Care/[facility]   Imported By: Maryln Gottron 12/22/2009 10:52:18  _____________________________________________________________________  External Attachment:    Type:   Image     Comment:   External Document

## 2010-08-18 NOTE — Progress Notes (Signed)
Summary: back pain  Phone Note Call from Patient   Caller: Patient Call For: Gordy Savers  MD Summary of Call: Pt called and left message he is worse than yesterday and wants to see Dr. Kirtland Bouchard.  Called back and left message to call us back for appt. Initial call taken by: Lynann Beaver CMA,  September 21, 2009 9:49 AM  Follow-up for Phone Call        Pt is coming to see Dr. Kirtland Bouchard. Follow-up by: Lynann Beaver CMA,  September 21, 2009 11:00 AM

## 2010-08-18 NOTE — Assessment & Plan Note (Signed)
Summary: PER CHECK OUT/SF      Allergies Added: NKDA  Visit Type:  Follow-up Primary Provider:  Gordy Savers  MD  CC:  Atiral Fibrillation.  History of Present Illness: The patient presents for followup of atrial fibrillation. He's having a tuft time recovering from a pneumonia that he had earlier this year. He's been quite fatigued. He still does some part-time work 3 times per week. He does a little exercise in his home. He denies any new chest pressure, neck or arm discomfort. He denies any new palpitations, presyncope or syncope. He's had a little tenderness in his right breast. He's had no new shortness of breath, PND or orthopnea.  Current Medications (verified): 1)  Levothyroxine Sodium 25 Mcg  Tabs (Levothyroxine Sodium) .[name].. 1 Once Daily 2)  Protonix 40 Mg  Tbec (Pantoprazole Sodium) .... 2 Daily 3)  Clonazepam 1 Mg  Tabs (Clonazepam) .[name].. 1  At Bedtime 4)  Amiodarone Hcl 200 Mg  Tabs (Amiodarone Hcl) .[name].. 1 Once Daily 5)  Trazodone Hcl 50 Mg  Tabs (Trazodone Hcl) .... One  At Bedtime 6)  Norvasc 5 Mg  Tabs (Amlodipine Besylate) .... One Daily 7)  Pravastatin Sodium 80 Mg  Tabs (Pravastatin Sodium) .... One Daily 8)  Adult Aspirin Ec Low Strength 81 Mg Tbec (Aspirin) .[name].. 1 Once Daily 9)  Mvi .... One By Mouth Daily 10)  Coq10 .... One By Mouth Daily 11)  Nitrostat 0.4 Mg Subl (Nitroglycerin) .... As Needed 12)  Hydrocodone-Acetaminophen 5-500 Mg Tabs (Hydrocodone-Acetaminophen) .... One Every 6 Hours As Needed For Pain 13)  Proventil Hfa 108 (90 Base) Mcg/act Aers (Albuterol Sulfate) .... As Needed  Allergies (verified): No Known Drug Allergies  Past History:  Past Medical History: Reviewed history from 11/25/2009 and no changes required. DJD Coronary artery disease (s/p inferior MI April 2006 with stenting to proximal and distal RCA) Diverticulosis, colon GERD Hypertension Benign prostatic hypertrophy Depression Anticoagulation therapy Atrial fibrillation  s/p cardioversion Sleep Apnea (on CPAP) Mitral regurgitation Hypothyroidism Abdominal wall hematoma  Past Surgical History: Reviewed history from 05/11/2009 and no changes required. Transurethral resection of prostate  Review of Systems       As stated in the HPI and negative for all other systems.   Vital Signs:  Patient profile:   77 year old male Height:      72 inches Weight:      196 pounds BMI:     26.68 Pulse rate:   59 / minute BP sitting:   134 / 72  (right arm)  Vitals Entered By: Laurance Flatten CMA (March 29, 2010 11:03 AM)  Physical Exam  General:  Well developed, well nourished, in no acute distress. Head:  normocephalic and atraumatic Eyes:  PERRLA/EOM intact; conjunctiva and lids normal. Neck:  Neck supple, no JVD. No masses, thyromegaly or abnormal cervical nodes. Chest Wall:  no deformities or breast masses noted Lungs:  Clear bilaterally to auscultation and percussion. Abdomen:  Bowel sounds positive; abdomen soft and non-tender without masses, organomegaly, or hernias noted. No hepatosplenomegaly. Msk:  Back normal, normal gait. Muscle strength and tone normal. Extremities:  No clubbing or cyanosis. Neurologic:  Alert and oriented x 3. Skin:  Intact without lesions or rashes. Cervical Nodes:  no significant adenopathy Inguinal Nodes:  no significant adenopathy Psych:  Normal affect.   Detailed Cardiovascular Exam  Neck    Carotids: Carotids full and equal bilaterally without bruits.      Neck Veins: Normal, no JVD.  Heart    Inspection: no deformities or lifts noted.      Palpation: normal PMI with no thrills palpable.      Auscultation: regular rate and rhythm, S1, S2 without murmurs, rubs, gallops, or clicks.    Vascular    Abdominal Aorta: no palpable masses, pulsations, or audible bruits.      Femoral Pulses: normal femoral pulses bilaterally.      Pedal Pulses: R and L carotid,radial,femoral,dorsalis pedis and posterior tibial  pulses are full and equal bilaterally    Radial Pulses: normal radial pulses bilaterally.      Peripheral Circulation: no clubbing, cyanosis, or edema noted with normal capillary refill.     EKG  Procedure date:  03/29/2010  Findings:      Sinus bradycardia, rate 59, leftward axis, incomplete right bundle branch block, no acute ST-T wave changes, nonspecific T-wave flattening  Impression & Recommendations:  Problem # 1:  MITRAL REGURGITATION (ICD-396.3) This was evaluated with an echo in May and found to be mild. His EF was 50-55%. No change in therapy is indicated. Orders: EKG w/ Interpretation (93000)  Problem # 2:  ATRIAL FIBRILLATION (ICD-427.31) He is fibrillation is controlled with amiodarone. I do not think this is the cause of the right breast tenderness. He has had recent labs at the [place] and I think these would include a TSH and liver. He's up-to-date otherwise. I will look for these labs and make sure we do our routine surveillance. He remains on aspirin.  He is off of the Coumadin because of an abdominal wall hematoma. As long as I don't see fibrillation I think the better part of valor is to continue to hold this medicine. Orders: EKG w/ Interpretation (93000)  Problem # 3:  CORONARY ARTERY DISEASE (ICD-414.00) He is having no ongoing angina. No further testing is indicated.  Patient Instructions: 1)  Your physician recommends that you schedule a follow-up appointment in: 6 months with Dr Antoine Poche 2)  Your physician recommends that you continue on your current medications as directed. Please refer to the Current Medication list given to you today.

## 2010-08-18 NOTE — Medication Information (Signed)
Summary: Order for [facility]  Order for [facility]   Imported By: Maryln Gottron 12/03/2009 13:07:47  _____________________________________________________________________  External Attachment:    Type:   Image     Comment:   External Document

## 2010-08-18 NOTE — Medication Information (Signed)
Summary: rov/eac  Anticoagulant Therapy  Managed by: Eda Keys, PharmD Referring MD: Rollene Rotunda MD PCP: Gordy Savers  MD Supervising MD: Myrtis Ser MD, Tinnie Gens Indication 1: Atrial Fibrillation (ICD-427.31) Lab Used: [facility] Site: [facility] INR POC 3.6 INR RANGE 2 - 3  Dietary changes: no    Health status changes: no    Bleeding/hemorrhagic complications: yes       Details: nose bleeds usually in the morning, minor bleeding on a regualr basis, a few times a week, easy to stop  Recent/future hospitalizations: yes       Details: Admitted for hip pain, about two weeks ago  Any changes in medication regimen? yes       Details: Pt has been prescribed an additional pain med for hip pain  Recent/future dental: no  Any missed doses?: yes     Details: while hospitalized, pt was instructed to hold 1 dose, then return to normal dose  Is patient compliant with meds? yes      Comments: Have educated pt on use of saline nasal spray and vaseline for nasal moisture.    Allergies: No Known Drug Allergies  Anticoagulation Management History:      The patient is taking warfarin and comes in today for a routine follow up visit.  Positive risk factors for bleeding include an age of 37 years or older.  The bleeding index is 'intermediate risk'.  Positive CHADS2 values include History of HTN and Age > 35 years old.  The start date was 07/07/2003.  His last INR was 3.1.  Anticoagulation responsible provider: Myrtis Ser MD, Tinnie Gens.  INR POC: 3.6.  Cuvette Lot#: [id number].  Exp: 11/2010.    Anticoagulation Management Assessment/Plan:      The patient's current anticoagulation dose is Coumadin 5 mg tabs: Take as directed by coumadin clinic..  The [facility] INR is 2 - 3.  The next INR is due 10/19/2009.  Anticoagulation instructions were given to patient.  Results were reviewed/authorized by Eda Keys, PharmD.  He was notified by Eda Keys.         Prior Anticoagulation  Instructions: INR 3.3  Do NOT take coumadin today.  Then restart normal dosing schedule of 1/2 tablet on Monday, Wednesday, and Friday and 1 tablet all other days.  Return to clinic in 3 weeks.    Current Anticoagulation Instructions: INR 3.6  Skip coumadin today.  Then start NEW dosing schedule of 1 tablet on Tuesday, Thursday, and Saturday, and take 1/2 tablet all other days.  Return to clinic in 2 weeks.

## 2010-08-18 NOTE — Assessment & Plan Note (Signed)
Summary: increased back pain.dm  The and  Vital Signs:  Patient profile:   77 year old male Weight:      200 pounds Temp:     97.6 degrees F oral BP sitting:   124 / 80  (left arm) Cuff size:   regular  Vitals Entered By: Duard Brady LPN (September 20, 1608 1:16 PM) CC: c/o back becoming worse - (R) flank pain, nausea, constipation   Primary Care Provider:  Gordy Savers  MD  CC:  c/o back becoming worse - (R) flank pain, nausea, and constipation.  History of Present Illness: 77 year old patient who is seen today for follow-up of right flank pain.  He was seen 4 days ago with increasingly severe pain in the right lumbar and flank area.  He has some milder pain in the right lower back area for about one month.  He was treated symptomatically with tramadol , but has had considerable nausea.  The pain has not improved;  pain remains aggravated by movement.  Denies any pleuritic component or shortness of breath.  No cough.  He does sleep fairly comfortably when he gets in a counterposition and a left or right lateral decubitus position  Allergies (verified): No Known Drug Allergies  Past History:  Past Medical History: Reviewed history from 05/11/2009 and no changes required. DJD Coronary artery disease (s/p inferior MI April 2006 with stenting to proximal and distal RCA) Diverticulosis, colon GERD Hypertension Benign prostatic hypertrophy Depression Anticoagulation therapy Atrial fibrillation s/p cardioversion Sleep Apnea (on CPAP) Mitral regurgitation Hypothyroidism  Review of Systems       The patient complains of difficulty walking.  The patient denies anorexia, fever, weight loss, weight gain, vision loss, decreased hearing, hoarseness, chest pain, syncope, dyspnea on exertion, peripheral edema, prolonged cough, headaches, hemoptysis, abdominal pain, melena, hematochezia, severe indigestion/heartburn, hematuria, incontinence, genital sores, muscle weakness,  suspicious skin lesions, transient blindness, depression, unusual weight change, abnormal bleeding, enlarged lymph nodes, angioedema, breast masses, and testicular masses.    Physical Exam  General:  uncomfortable;  transfers, difficult due to pain; pain in the right posterior back aggravated by assuming the supine position.  Blood pressure normal Neck:  No deformities, masses, or tenderness noted. Lungs:  Normal respiratory effort, chest expands symmetrically. Lungs are clear to auscultation, no crackles or wheezes. O2 saturation 97% Heart:  Normal rate and regular rhythm. S1 and S2 normal without gallop, murmur, click, rub or other extra sounds. pulse rate 64 Abdomen:  Bowel sounds positive,abdomen soft and non-tender without masses, organomegaly or hernias noted. Msk:  no CVA tenderness, and mild tenderness in the right lumbar musculature; no tenderness over the ribs are costal margin.  No tenderness over the thoracic or lumbar vertebral  bodies.   Impression & Recommendations:  Problem # 1:  BACK PAIN (ICD-724.5)  The following medications were removed from the medication list:    Tramadol Hcl 50 Mg Tabs (Tramadol hcl) ..... One every 6 hours as needed for pain His updated medication list for this problem includes:    Adult Aspirin Ec Low Strength 81 Mg Tbec (Aspirin) .[name]... 1 once daily    Hydrocodone-acetaminophen 5-500 Mg Tabs (Hydrocodone-acetaminophen) ..... One every 6 hours as needed for pain  will discontinue the tramadol, which has caused nausea and has not been very helpful.  Will complete his 10 days of prednisone and treat with hydrocodone for pain.  Will continue on his benzodiazepine.  Will call if unimproved  Problem # 2:  ANTICOAGULATION  THERAPY (ICD-V58.61)  Problem # 3:  ATRIAL FIBRILLATION (ICD-427.31)  His updated medication list for this problem includes:    Amiodarone Hcl 200 Mg Tabs (Amiodarone hcl) .[name]... 1 once daily    Norvasc 5 Mg Tabs (Amlodipine  besylate) ..... One daily    Adult Aspirin Ec Low Strength 81 Mg Tbec (Aspirin) .[name]... 1 once daily    Coumadin 5 Mg Tabs (Warfarin sodium) .[name]... Take as directed by coumadin clinic.  Complete Medication List: 1)  Levothyroxine Sodium 25 Mcg Tabs (Levothyroxine sodium) .[name].. 1 once daily 2)  Protonix 40 Mg Tbec (Pantoprazole sodium) .... 2 daily 3)  Clonazepam 1 Mg Tabs (Clonazepam) .[name].. 1  at bedtime 4)  Amiodarone Hcl 200 Mg Tabs (Amiodarone hcl) .[name].. 1 once daily 5)  Trazodone Hcl 50 Mg Tabs (Trazodone hcl) .... One  at bedtime 6)  Norvasc 5 Mg Tabs (Amlodipine besylate) .... One daily 7)  Pravastatin Sodium 80 Mg Tabs (Pravastatin sodium) .... One daily 8)  Adult Aspirin Ec Low Strength 81 Mg Tbec (Aspirin) .[name].. 1 once daily 9)  Mvi  .... One by mouth daily 10)  Coq10  .... One by mouth daily 11)  Coumadin 5 Mg Tabs (Warfarin sodium) .... Take as directed by coumadin clinic. 12)  Nitrostat 0.4 Mg Subl (Nitroglycerin) .... As needed 13)  Prednisone 5 Mg Tabs (Prednisone) .... One twice daily 14)  Hydrocodone-acetaminophen 5-500 Mg Tabs (Hydrocodone-acetaminophen) .... One every 6 hours as needed for pain  Patient Instructions: 1)  Most patients (90%) with low back pain will improve with time (2-6 weeks). Keep active but avoid activities that are painful. Apply moist heat   to lower back several times a day. Prescriptions: HYDROCODONE-ACETAMINOPHEN 5-500 MG TABS (HYDROCODONE-ACETAMINOPHEN) one every 6 hours as needed for pain  #50 x 2   Entered and Authorized by:   Gordy Savers  MD   Signed by:   Gordy Savers  MD on 09/20/2009   Method used:   Print then Give to Patient   RxID:   [id number]

## 2010-08-18 NOTE — Progress Notes (Signed)
Summary: xray results.  Phone Note Call from Patient   Caller: Patient Call For: Gordy Savers  MD Summary of Call: Pt is calling for xray results [facility]. [phone] Initial call taken by: Lynann Beaver CMA,  September 21, 2009 4:22 PM  Follow-up for Phone Call        called pt - still in pain - states injections rec'd in office just made him sleepy - informed that xray really didn't show anything - per Dr. Amador Cunas , use vicodan , call if no better , may consider CT or MRI. Told him to call before 11am tomorrow due to it being a half day in office.  Follow-up by: Duard Brady LPN,  September 21, 2009 4:31 PM  Additional Follow-up for Phone Call Additional follow up Details #1::        noted Additional Follow-up by: Gordy Savers  MD,  September 21, 2009 4:57 PM

## 2010-08-18 NOTE — Medication Information (Signed)
Summary: rov/ewj  Anticoagulant Therapy  Managed by: Cloyde Reams, RN, BSN Referring MD: Rollene Rotunda MD PCP: Gordy Savers  MD Supervising MD: Gala Romney MD, Reuel Boom Indication 1: Atrial Fibrillation (ICD-427.31) Lab Used: [facility] Site: [facility] INR POC 2.8 INR RANGE 2 - 3   Health status changes: no    Bleeding/hemorrhagic complications: yes       Details: Pt reports nose bleeds, nose bled almost all day Friday 08/13/09.    Recent/future hospitalizations: no    Any changes in medication regimen? no    Recent/future dental: no  Any missed doses?: no       Is patient compliant with meds? yes       Allergies (verified): No Known Drug Allergies  Anticoagulation Management History:      The patient is taking warfarin and comes in today for a routine follow up visit.  Positive risk factors for bleeding include an age of 75 years or older.  The bleeding index is 'intermediate risk'.  Positive CHADS2 values include History of HTN and Age > 15 years old.  The start date was 07/07/2003.  His last INR was 3.1.  Anticoagulation responsible provider: Bensimhon MD, Reuel Boom.  INR POC: 2.8.  Cuvette Lot#: [id number].  Exp: 08/2010.    Anticoagulation Management Assessment/Plan:      The patient's current anticoagulation dose is Coumadin 5 mg tabs: Take as directed by coumadin clinic..  The target INR is 2 - 3.  The next INR is due 09/14/2009.  Anticoagulation instructions were given to patient.  Results were reviewed/authorized by Cloyde Reams, RN, BSN.  He was notified by Cloyde Reams RN.         Prior Anticoagulation Instructions: INR 3.1  Take 1/2 tablet today, then continue taking 1 tablet daily except 1/2 tablet on Mondays, Wednesdays, and Fridays. Recheck in 4 weeks.  Current Anticoagulation Instructions: INR 2.8  Continue on same dosage 1 tablet daily except 1/2 tablet on Mondays, Wednesdays, and Fridays.  Recheck in 4 weeks.

## 2010-08-18 NOTE — Assessment & Plan Note (Signed)
Summary: 4 MTH ROV // RS pt rsc/njr   Vital Signs:  Patient profile:   77 year old male Weight:      191 pounds Temp:     97.7 degrees F oral BP sitting:   120 / 70  (left arm) Cuff size:   regular  Vitals Entered By: Duard Brady LPN (December 28, 2009 10:45 AM) CC: 4 mos rov - doing much better Is Patient Diabetic? No   Primary Care Provider:  Gordy Savers  MD  CC:  4 mos rov - doing much better.  History of Present Illness: 77 year old patient who is seen today for follow-up.  If possible as the earlier due to extensive pneumonia.  He is required aggressive outpatient physical therapy due to severe deconditioning.  He has a history of atrial fibrillation and, dyslipidemia, hypothyroidism.  He is doing remarkably well and has regained considerable strength.  His cardiac status has been stable  Allergies (verified): No Known Drug Allergies  Past History:  Past Medical History: Reviewed history from 11/25/2009 and no changes required. DJD Coronary artery disease (s/p inferior MI April 2006 with stenting to proximal and distal RCA) Diverticulosis, colon GERD Hypertension Benign prostatic hypertrophy Depression Anticoagulation therapy Atrial fibrillation s/p cardioversion Sleep Apnea (on CPAP) Mitral regurgitation Hypothyroidism Abdominal wall hematoma  Review of Systems       The patient complains of muscle weakness.  The patient denies anorexia, fever, weight loss, weight gain, vision loss, decreased hearing, hoarseness, chest pain, syncope, dyspnea on exertion, peripheral edema, prolonged cough, headaches, hemoptysis, abdominal pain, melena, hematochezia, severe indigestion/heartburn, hematuria, incontinence, genital sores, suspicious skin lesions, transient blindness, difficulty walking, depression, unusual weight change, abnormal bleeding, enlarged lymph nodes, angioedema, breast masses, and testicular masses.    Physical Exam  General:   Well-developed,well-nourished,in no acute distress; alert,appropriate and cooperative throughout examination; normal blood pressure Mouth:  Oral mucosa and oropharynx without lesions or exudates.  Teeth in good repair. Neck:  No deformities, masses, or tenderness noted. Lungs:  Normal respiratory effort, chest expands symmetrically. Lungs are clear to auscultation, no crackles or wheezes. Heart:  Normal rate and regular rhythm. S1 and S2 normal without gallop, murmur, click, rub or other extra sounds. Abdomen:  Bowel sounds positive,abdomen soft and non-tender without masses, organomegaly or hernias noted. Msk:  No deformity or scoliosis noted of thoracic or lumbar spine.   Pulses:  R and L carotid,radial,femoral,dorsalis pedis and posterior tibial pulses are full and equal bilaterally   Impression & Recommendations:  Problem # 1:  WEAKNESS (ICD-780.79)  Problem # 2:  ATRIAL FIBRILLATION (ICD-427.31)  His updated medication list for this problem includes:    Amiodarone Hcl 200 Mg Tabs (Amiodarone hcl) .[name]... 1 once daily    Norvasc 5 Mg Tabs (Amlodipine besylate) ..... One daily    Adult Aspirin Ec Low Strength 81 Mg Tbec (Aspirin) .[name]... 1 once daily  Problem # 3:  HYPERTENSION (ICD-401.9)  His updated medication list for this problem includes:    Norvasc 5 Mg Tabs (Amlodipine besylate) ..... One daily  Complete Medication List: 1)  Levothyroxine Sodium 25 Mcg Tabs (Levothyroxine sodium) .[name].. 1 once daily 2)  Protonix 40 Mg Tbec (Pantoprazole sodium) .... 2 daily 3)  Clonazepam 1 Mg Tabs (Clonazepam) .[name].. 1  at bedtime 4)  Amiodarone Hcl 200 Mg Tabs (Amiodarone hcl) .[name].. 1 once daily 5)  Trazodone Hcl 50 Mg Tabs (Trazodone hcl) .... One  at bedtime 6)  Norvasc 5 Mg Tabs (Amlodipine besylate) .... One  daily 7)  Pravastatin Sodium 80 Mg Tabs (Pravastatin sodium) .... One daily 8)  Adult Aspirin Ec Low Strength 81 Mg Tbec (Aspirin) .[name].. 1 once daily 9)  Mvi  .... One by mouth  daily 10)  Coq10  .... One by mouth daily 11)  Nitrostat 0.4 Mg Subl (Nitroglycerin) .... As needed 12)  Hydrocodone-acetaminophen 5-500 Mg Tabs (Hydrocodone-acetaminophen) .... One every 6 hours as needed for pain 13)  Proventil Hfa 108 (90 Base) Mcg/act Aers (Albuterol sulfate) .... As needed  Patient Instructions: 1)  Please schedule a follow-up appointment in 4 months. 2)  Limit your Sodium (Salt) to less than 2 grams a day(slightly less than 1/2 a teaspoon) to prevent fluid retention, swelling, or worsening of symptoms. 3)  It is important that you exercise regularly at least 20 minutes 5 times a week. If you develop chest pain, have severe difficulty breathing, or feel very tired , stop exercising immediately and seek medical attention.

## 2010-08-18 NOTE — Miscellaneous (Signed)
Summary: [facility]  [facility]   Imported By: Roderic Ovens 09/27/2009 14:08:58  _____________________________________________________________________  External Attachment:    Type:   Image     Comment:   External Document

## 2010-08-18 NOTE — Miscellaneous (Signed)
Summary: [facility]  [facility]   Imported By: Roderic Ovens 09/27/2009 15:57:33  _____________________________________________________________________  External Attachment:    Type:   Image     Comment:   External Document

## 2010-08-18 NOTE — Assessment & Plan Note (Signed)
Summary: 4 month fup//ccm   Vital Signs:  Patient profile:   77 year old male Weight:      198 pounds Temp:     97.7 degrees F oral BP sitting:   114 / 80  (left arm) Cuff size:   regular  Vitals Entered By: Duard Brady LPN (April 19, 2010 10:01 AM) CC: 4 mos rov - doing ok Is Patient Diabetic? No   Primary Care Provider:  Gordy Savers  MD  CC:  4 mos rov - doing ok.  History of Present Illness: 77 year old patient who is seen today for follow-up.  He has a history of atrial for ablation, which has been well controlled on amiodarone therapy.  He is on aspirin only and the plan is to consider further anticoagulation only.  If there is recurrence of atrial fibrillation.  He has treated hypertension and coronary artery disease, which have been stable.  He continues to complain of some very mild fullness and tenderness of the right breast.  This has been unchanged and is quite mild.   Allergies (verified): No Known Drug Allergies  Past History:  Past Medical History: Reviewed history from 11/25/2009 and no changes required. DJD Coronary artery disease (s/p inferior MI April 2006 with stenting to proximal and distal RCA) Diverticulosis, colon GERD Hypertension Benign prostatic hypertrophy Depression Anticoagulation therapy Atrial fibrillation s/p cardioversion Sleep Apnea (on CPAP) Mitral regurgitation Hypothyroidism Abdominal wall hematoma  Review of Systems       The patient complains of muscle weakness.  The patient denies anorexia, fever, weight loss, weight gain, vision loss, decreased hearing, hoarseness, chest pain, syncope, dyspnea on exertion, peripheral edema, prolonged cough, headaches, hemoptysis, abdominal pain, melena, hematochezia, severe indigestion/heartburn, hematuria, incontinence, genital sores, suspicious skin lesions, transient blindness, difficulty walking, depression, unusual weight change, abnormal bleeding, enlarged lymph nodes,  angioedema, breast masses, and testicular masses.    Physical Exam  General:  Well-developed,well-nourished,in no acute distress; alert,appropriate and cooperative throughout examination; 110/74 Head:  Normocephalic and atraumatic without obvious abnormalities. No apparent alopecia or balding. Mouth:  Oral mucosa and oropharynx without lesions or exudates.  Teeth in good repair. Neck:  No deformities, masses, or tenderness noted. Lungs:  Normal respiratory effort, chest expands symmetrically. Lungs are clear to auscultation, no crackles or wheezes. Heart:  Normal rate and regular rhythm. S1 and S2 normal without gallop, murmur, click, rub or other extra sounds. Abdomen:  Bowel sounds positive,abdomen soft and non-tender without masses, organomegaly or hernias noted. Msk:  No deformity or scoliosis noted of thoracic or lumbar spine.   Extremities:  no edema   Impression & Recommendations:  Problem # 1:  GYNECOMASTIA, UNILATERAL (ICD-611.1) patient probably has some very mild right-sided gynecomastia, which has not worsened; gynecomastia is a possible side effect of amiodarone, as well as calcium blockers, and proton pump inhibitorsl; unless this worsens, will continue to follow and continue  his present medical regimen  Problem # 2:  WEAKNESS (ICD-780.79) improved  Problem # 3:  ATRIAL FIBRILLATION (ICD-427.31)  His updated medication list for this problem includes:    Amiodarone Hcl 200 Mg Tabs (Amiodarone hcl) .[name]... 1 once daily    Norvasc 5 Mg Tabs (Amlodipine besylate) ..... One daily    Adult Aspirin Ec Low Strength 81 Mg Tbec (Aspirin) .[name]... 1 once daily remains in normal sinus rhythm  His updated medication list for this problem includes:    Amiodarone Hcl 200 Mg Tabs (Amiodarone hcl) .[name]... 1 once daily  Norvasc 5 Mg Tabs (Amlodipine besylate) ..... One daily    Adult Aspirin Ec Low Strength 81 Mg Tbec (Aspirin) .[name]... 1 once daily  Complete Medication List: 1)   Levothyroxine Sodium 25 Mcg Tabs (Levothyroxine sodium) .[name].. 1 once daily 2)  Protonix 40 Mg Tbec (Pantoprazole sodium) .... 2 daily 3)  Clonazepam 1 Mg Tabs (Clonazepam) .[name].. 1  at bedtime 4)  Amiodarone Hcl 200 Mg Tabs (Amiodarone hcl) .[name].. 1 once daily 5)  Trazodone Hcl 50 Mg Tabs (Trazodone hcl) .... One  at bedtime 6)  Norvasc 5 Mg Tabs (Amlodipine besylate) .... One daily 7)  Pravastatin Sodium 80 Mg Tabs (Pravastatin sodium) .... One daily 8)  Adult Aspirin Ec Low Strength 81 Mg Tbec (Aspirin) .[name].. 1 once daily 9)  Mvi  .... One by mouth daily 10)  Coq10  .... One by mouth daily 11)  Nitrostat 0.4 Mg Subl (Nitroglycerin) .... As needed  Patient Instructions: 1)  Please schedule a follow-up appointment in 4 months. 2)  Limit your Sodium (Salt). 3)  It is important that you exercise regularly at least 20 minutes 5 times a week. If you develop chest pain, have severe difficulty breathing, or feel very tired , stop exercising immediately and seek medical attention. Prescriptions: PRAVASTATIN SODIUM 80 MG  TABS (PRAVASTATIN SODIUM) one daily  #90 x 4   Entered and Authorized by:   Gordy Savers  MD   Signed by:   Gordy Savers  MD on 04/19/2010   Method used:   Electronically to        [id number]* (retail)       [address]       [place], [place]  [address]       Ph: [phone]       Fax: [phone]   RxID:   [id number] NORVASC 5 MG  TABS (AMLODIPINE BESYLATE) one daily  #90 x 4   Entered and Authorized by:   Gordy Savers  MD   Signed by:   Gordy Savers  MD on 04/19/2010   Method used:   Electronically to        [id number]* (retail)       [address]       [place], [place]  [address]       Ph: [phone]       Fax: [phone]   RxID:   [id number] TRAZODONE HCL 50 MG  TABS (TRAZODONE HCL) one  at bedtime  #90 x 4   Entered and Authorized by:   Gordy Savers  MD   Signed by:    Gordy Savers  MD on 04/19/2010   Method used:   Electronically to        [id number]* (retail)       [address]       [place], [place]  [address]       Ph: [phone]       Fax: [phone]   RxID:   [id number] AMIODARONE HCL 200 MG  TABS (AMIODARONE HCL) 1 once daily  #90 x 4   Entered and Authorized by:   Gordy Savers  MD   Signed by:   Gordy Savers  MD on 04/19/2010   Method used:   Electronically to        [id number]* (retail)       [address]  [place], [place]  [address]       Ph: [phone]       Fax: [phone]   RxID:   [id number] PROTONIX 40 MG  TBEC (PANTOPRAZOLE SODIUM) 2 daily  #90 x 6   Entered and Authorized by:   Gordy Savers  MD   Signed by:   Gordy Savers  MD on 04/19/2010   Method used:   Electronically to        [facility] [address] (retail)       [address]       [place], [place]  [address]       Ph: [phone]       Fax: [phone]   RxID:   [id number] LEVOTHYROXINE SODIUM 25 MCG  TABS (LEVOTHYROXINE SODIUM) 1 once daily  #90 x 6   Entered and Authorized by:   Gordy Savers  MD   Signed by:   Gordy Savers  MD on 04/19/2010   Method used:   Electronically to        [facility] [address] (retail)       [address]       [place], [place]  [address]       Ph: [phone]       Fax: [phone]   RxID:   [id number]

## 2010-08-18 NOTE — Miscellaneous (Signed)
Summary: [facility]   [facility]   Imported By: Roderic Ovens 12/22/2009 14:34:51  _____________________________________________________________________  External Attachment:    Type:   Image     Comment:   External Document

## 2010-08-18 NOTE — Assessment & Plan Note (Signed)
Summary: 4 month rov/njr   Vital Signs:  Patient profile:   77 year old male Weight:      204 pounds Temp:     98.3 degrees F oral BP sitting:   146 / 80  (left arm) Cuff size:   regular  Vitals Entered By: Raechel Ache, RN (August 05, 2009 2:50 PM) CC: 4 mo ROV   Primary Care Provider:  Gordy Savers  MD  CC:  4 mo ROV.  History of Present Illness: 77 year old patient who is seen today for follow-up.  patient is doing quite well clinically and was evaluated by cardiology two days ago.  He has a history of coronary artery disease, paroxysmal atrial fibrillation, on chronic anticoagulation therapy.  He has done well since his last visit, and even working part time.  Presently.  Medical regimen includes amiodarone and patient needs follow-up.  Thyroid function studies and LFTs.  His depression has been stable  Allergies: No Known Drug Allergies  Review of Systems  The patient denies anorexia, fever, weight loss, weight gain, vision loss, decreased hearing, hoarseness, chest pain, syncope, dyspnea on exertion, peripheral edema, prolonged cough, headaches, hemoptysis, abdominal pain, melena, hematochezia, severe indigestion/heartburn, hematuria, incontinence, genital sores, muscle weakness, suspicious skin lesions, transient blindness, difficulty walking, depression, unusual weight change, abnormal bleeding, enlarged lymph nodes, angioedema, breast masses, and testicular masses.    Physical Exam  General:  Well-developed,well-nourished,in no acute distress; alert,appropriate and cooperative throughout examination Head:  Normocephalic and atraumatic without obvious abnormalities. No apparent alopecia or balding. Eyes:  No corneal or conjunctival inflammation noted. EOMI. Perrla. Funduscopic exam benign, without hemorrhages, exudates or papilledema. Vision grossly normal. Mouth:  Oral mucosa and oropharynx without lesions or exudates.  Teeth in good repair. Neck:  No  deformities, masses, or tenderness noted. Chest Wall:  No deformities, masses, tenderness or gynecomastia noted. Lungs:  Normal respiratory effort, chest expands symmetrically. Lungs are clear to auscultation, no crackles or wheezes. Heart:  Normal rate and regular rhythm. S1 and S2 normal without gallop, murmur, click, rub or other extra sounds. Abdomen:  Bowel sounds positive,abdomen soft and non-tender without masses, organomegaly or hernias noted. Msk:  No deformity or scoliosis noted of thoracic or lumbar spine.   Pulses:  R and L carotid,radial,femoral,dorsalis pedis and posterior tibial pulses are full and equal bilaterally   Impression & Recommendations:  Problem # 1:  ENCOUNTER FOR LONG-TERM USE OF OTHER MEDICATIONS (ICD-V58.69)  Problem # 2:  ANTICOAGULATION THERAPY (ICD-V58.61)  Problem # 3:  HYPOTHYROIDISM (ICD-244.9)  His updated medication list for this problem includes:    Levothyroxine Sodium 25 Mcg Tabs (Levothyroxine sodium) .[name]... 1 once daily    His updated medication list for this problem includes:    Levothyroxine Sodium 25 Mcg Tabs (Levothyroxine sodium) .[name]... 1 once daily  Problem # 4:  ATRIAL FIBRILLATION (ICD-427.31)  His updated medication list for this problem includes:    Amiodarone Hcl 200 Mg Tabs (Amiodarone hcl) .[name]... 1 once daily    Norvasc 5 Mg Tabs (Amlodipine besylate) ..... One daily    Adult Aspirin Ec Low Strength 81 Mg Tbec (Aspirin) .[name]... 1 once daily    Coumadin 5 Mg Tabs (Warfarin sodium) .[name]... Take as directed by coumadin clinic.    His updated medication list for this problem includes:    Amiodarone Hcl 200 Mg Tabs (Amiodarone hcl) .[name]... 1 once daily    Norvasc 5 Mg Tabs (Amlodipine besylate) ..... One daily    Adult Aspirin Ec  Low Strength 81 Mg Tbec (Aspirin) .[name]... 1 once daily    Coumadin 5 Mg Tabs (Warfarin sodium) .[name]... Take as directed by coumadin clinic.  Complete Medication List: 1)  Levothyroxine Sodium 25 Mcg Tabs  (Levothyroxine sodium) .[name].. 1 once daily 2)  Protonix 40 Mg Tbec (Pantoprazole sodium) .... 2 daily 3)  Clonazepam 1 Mg Tabs (Clonazepam) .[name].. 1  at bedtime 4)  Amiodarone Hcl 200 Mg Tabs (Amiodarone hcl) .[name].. 1 once daily 5)  Trazodone Hcl 50 Mg Tabs (Trazodone hcl) .... One  at bedtime 6)  Norvasc 5 Mg Tabs (Amlodipine besylate) .... One daily 7)  Pravastatin Sodium 80 Mg Tabs (Pravastatin sodium) .... One daily 8)  Adult Aspirin Ec Low Strength 81 Mg Tbec (Aspirin) .[name].. 1 once daily 9)  Mvi  .... One by mouth daily 10)  Coq10  .... One by mouth daily 11)  Coumadin 5 Mg Tabs (Warfarin sodium) .... Take as directed by coumadin clinic. 12)  Nitrostat 0.4 Mg Subl (Nitroglycerin) .... As needed  Other Orders: Pneumococcal Vaccine (14782) Admin 1st Vaccine (95621) Venipuncture (30865) TLB-CBC Platelet - w/Differential (85025-CBCD) TLB-Hepatic/Liver Function Pnl (80076-HEPATIC) TLB-TSH (Thyroid Stimulating Hormone) (84443-TSH)  Patient Instructions: 1)  Please schedule a follow-up appointment in 4 months. 2)  Limit your Sodium (Salt). 3)  It is important that you exercise regularly at least 20 minutes 5 times a week. If you develop chest pain, have severe difficulty breathing, or feel very tired , stop exercising immediately and seek medical attention. Prescriptions: COUMADIN 5 MG TABS (WARFARIN SODIUM) Take as directed by coumadin clinic.  #90 x 4   Entered and Authorized by:   Gordy Savers  MD   Signed by:   Gordy Savers  MD on 08/05/2009   Method used:   Print then Give to Patient   RxID:   [id number] PRAVASTATIN SODIUM 80 MG  TABS (PRAVASTATIN SODIUM) one daily  #90 x 4   Entered and Authorized by:   Gordy Savers  MD   Signed by:   Gordy Savers  MD on 08/05/2009   Method used:   Print then Give to Patient   RxID:   [id number] NORVASC 5 MG  TABS (AMLODIPINE BESYLATE) one daily  #90 x 4   Entered and Authorized by:   Gordy Savers  MD    Signed by:   Gordy Savers  MD on 08/05/2009   Method used:   Print then Give to Patient   RxID:   [id number] TRAZODONE HCL 50 MG  TABS (TRAZODONE HCL) one  at bedtime  #90 x 4   Entered and Authorized by:   Gordy Savers  MD   Signed by:   Gordy Savers  MD on 08/05/2009   Method used:   Print then Give to Patient   RxID:   [id number] AMIODARONE HCL 200 MG  TABS (AMIODARONE HCL) 1 once daily  #90 x 4   Entered and Authorized by:   Gordy Savers  MD   Signed by:   Gordy Savers  MD on 08/05/2009   Method used:   Print then Give to Patient   RxID:   [id number] CLONAZEPAM 1 MG  TABS (CLONAZEPAM) 1  at bedtime  #90 x 44   Entered and Authorized by:   Gordy Savers  MD   Signed by:   Gordy Savers  MD on 08/05/2009   Method used:   Print then Give to Patient  RxID:   [id number] PROTONIX 40 MG  TBEC (PANTOPRAZOLE SODIUM) 2 daily  #90 x 6   Entered and Authorized by:   Gordy Savers  MD   Signed by:   Gordy Savers  MD on 08/05/2009   Method used:   Print then Give to Patient   RxID:   [id number] LEVOTHYROXINE SODIUM 25 MCG  TABS (LEVOTHYROXINE SODIUM) 1 once daily  #90 x 6   Entered and Authorized by:   Gordy Savers  MD   Signed by:   Gordy Savers  MD on 08/05/2009   Method used:   Print then Give to Patient   RxID:   [id number]    Immunizations Administered:  Pneumonia Vaccine:    Vaccine Type: Pneumovax    Site: right deltoid    Mfr: Merck    Dose: 0.5 ml    Route: IM    Given by: Raechel Ache, RN    Exp. Date: 08/12/2010    Lot #: [id number]    VIS given: 02/12/96 version given August 05, 2009.

## 2010-08-23 ENCOUNTER — Ambulatory Visit (INDEPENDENT_AMBULATORY_CARE_PROVIDER_SITE_OTHER): Payer: Medicare Other | Admitting: Internal Medicine

## 2010-08-23 ENCOUNTER — Encounter: Payer: Self-pay | Admitting: Internal Medicine

## 2010-08-23 DIAGNOSIS — I2510 Atherosclerotic heart disease of native coronary artery without angina pectoris: Secondary | ICD-10-CM

## 2010-08-23 DIAGNOSIS — I10 Essential (primary) hypertension: Secondary | ICD-10-CM

## 2010-08-23 DIAGNOSIS — E785 Hyperlipidemia, unspecified: Secondary | ICD-10-CM

## 2010-08-23 DIAGNOSIS — I4891 Unspecified atrial fibrillation: Secondary | ICD-10-CM

## 2010-08-23 MED ORDER — AMLODIPINE BESYLATE 2.5 MG PO TABS: 2.5000 mg | ORAL_TABLET | Freq: Every day | ORAL | Status: DC

## 2010-08-23 NOTE — Progress Notes (Signed)
  Subjective:    Patient ID: [name], male    DOB: July 12, 1927, 77 y.o.   MRN: [id number]  HPI   77 year old patient who is seen today for follow-up.  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  He has a true for ablation, status post cardioversion, and remains on amiodarone therapy, as well as chronic anticoagulation.  He has treated hypertension.  History depression, and hypothyroidism.  He was hospitalized for pneumonia in the spring of last year. He has done reasonably well.  He does describe some weakness and slight dizziness Usually  in the morning.  He feels is related to a slightly low blood pressure readings.  He does track home blood pressure readings faithfully and readings are never elevated. His cardiac Pomer status has been stable.  Denies any exertional chest pain.  He is now walking 2 mph on the treadmill 30 minutes daily.  There's been some modest weight gain, but no peripheral edema.  No symptoms of congestive heart failure   Review of Systems  Constitutional: Negative for fever, chills, appetite change and fatigue.  HENT: Negative for hearing loss, ear pain, congestion, sore throat, trouble swallowing, neck stiffness, dental problem, voice change and tinnitus.   Eyes: Negative for pain, discharge and visual disturbance.  Respiratory: Negative for cough, chest tightness, wheezing and stridor.   Cardiovascular: Negative for chest pain, palpitations and leg swelling.  Gastrointestinal: Negative for nausea, vomiting, abdominal pain, diarrhea, constipation, blood in stool and abdominal distention.  Genitourinary: Negative for urgency, hematuria, flank pain, discharge, difficulty urinating and genital sores.  Musculoskeletal: Negative for myalgias, back pain, joint swelling, arthralgias and gait problem.  Skin: Negative for rash.  Neurological: Negative for dizziness, syncope, speech  difficulty, weakness, numbness and headaches.  Hematological: Negative for adenopathy. Does not bruise/bleed easily.  Psychiatric/Behavioral: Negative for behavioral problems and dysphoric mood. The patient is not nervous/anxious.        Objective:   Physical Exam  Constitutional: He is oriented to person, place, and time. He appears well-developed and well-nourished.  HENT:  Head: Normocephalic.  Right Ear: External ear normal.  Left Ear: External ear normal.  Mouth/Throat: Oropharynx is clear and moist.  Eyes: Conjunctivae and EOM are normal. Pupils are equal, round, and reactive to light.  Neck: Normal range of motion. No JVD present. No thyromegaly present.  Cardiovascular: Normal rate, regular rhythm and normal heart sounds.        No peripheral edema  Pulmonary/Chest: Breath sounds normal.  Abdominal: Soft. Bowel sounds are normal. He exhibits no distension and no mass. There is no tenderness. There is no rebound.  Musculoskeletal: Normal range of motion. He exhibits no edema and no tenderness.  Neurological: He is alert and oriented to person, place, and time.  Psychiatric: He has a normal mood and affect. His behavior is normal.          Assessment & Plan:  Hypertension-  The patient's blood pressures off a low-normal the morning and this seems to be associated with weakness and dizziness.  Blood pressure is or never elevated.  Will decrease his amlodipine 22 1/2 milligrams.  Daily Paroxysmal atrial Fibrillation-presently normal sinus rhythm Coronary artery disease Clinically stable

## 2010-08-23 NOTE — Patient Instructions (Signed)
Limit your sodium (Salt) intake    It is important that you exercise regularly, at least 20 minutes 3 to 4 times per week.  If you develop chest pain or shortness of breath seek  medical attention.  Please check your blood pressure on a regular basis.  If it is consistently greater than 150/90, please make an office appointment.  

## 2010-10-04 LAB — CBC
HCT: 25.5 % — ABNORMAL LOW (ref 39.0–52.0)
HCT: 26 % — ABNORMAL LOW (ref 39.0–52.0)
HCT: 27.2 % — ABNORMAL LOW (ref 39.0–52.0)
HCT: 27.3 % — ABNORMAL LOW (ref 39.0–52.0)
HCT: 28.6 % — ABNORMAL LOW (ref 39.0–52.0)
HCT: 29.2 % — ABNORMAL LOW (ref 39.0–52.0)
HCT: 27.5 % — ABNORMAL LOW (ref 39.0–52.0)
HCT: 30.7 % — ABNORMAL LOW (ref 39.0–52.0)
HCT: 34.4 % — ABNORMAL LOW (ref 39.0–52.0)
Hemoglobin: 8.6 g/dL — ABNORMAL LOW (ref 13.0–17.0)
Hemoglobin: 8.8 g/dL — ABNORMAL LOW (ref 13.0–17.0)
Hemoglobin: 9 g/dL — ABNORMAL LOW (ref 13.0–17.0)
Hemoglobin: 9.3 g/dL — ABNORMAL LOW (ref 13.0–17.0)
Hemoglobin: 10.1 g/dL — ABNORMAL LOW (ref 13.0–17.0)
MCHC: 33.3 g/dL (ref 30.0–36.0)
MCHC: 33.5 g/dL (ref 30.0–36.0)
MCHC: 33.5 g/dL (ref 30.0–36.0)
MCHC: 34 g/dL (ref 30.0–36.0)
MCHC: 34.1 g/dL (ref 30.0–36.0)
MCHC: 34 g/dL (ref 30.0–36.0)
MCHC: 34.8 g/dL (ref 30.0–36.0)
MCV: 91.1 fL (ref 78.0–100.0)
MCV: 91.5 fL (ref 78.0–100.0)
MCV: 91.5 fL (ref 78.0–100.0)
MCV: 91.7 fL (ref 78.0–100.0)
MCV: 92 fL (ref 78.0–100.0)
MCV: 92 fL (ref 78.0–100.0)
MCV: 91.1 fL (ref 78.0–100.0)
MCV: 91.2 fL (ref 78.0–100.0)
MCV: 92.7 fL (ref 78.0–100.0)
Platelets: 170 10*3/uL (ref 150–400)
Platelets: 211 10*3/uL (ref 150–400)
Platelets: 288 10*3/uL (ref 150–400)
Platelets: 329 10*3/uL (ref 150–400)
Platelets: 424 10*3/uL — ABNORMAL HIGH (ref 150–400)
Platelets: 469 10*3/uL — ABNORMAL HIGH (ref 150–400)
Platelets: 470 10*3/uL — ABNORMAL HIGH (ref 150–400)
Platelets: 142 10*3/uL — ABNORMAL LOW (ref 150–400)
Platelets: 146 10*3/uL — ABNORMAL LOW (ref 150–400)
Platelets: 165 10*3/uL (ref 150–400)
RBC: 2.84 MIL/uL — ABNORMAL LOW (ref 4.22–5.81)
RBC: 2.88 MIL/uL — ABNORMAL LOW (ref 4.22–5.81)
RBC: 2.92 MIL/uL — ABNORMAL LOW (ref 4.22–5.81)
RBC: 2.95 MIL/uL — ABNORMAL LOW (ref 4.22–5.81)
RBC: 3.01 MIL/uL — ABNORMAL LOW (ref 4.22–5.81)
RBC: 3.17 MIL/uL — ABNORMAL LOW (ref 4.22–5.81)
RDW: 16.2 % — ABNORMAL HIGH (ref 11.5–15.5)
RDW: 16.4 % — ABNORMAL HIGH (ref 11.5–15.5)
RDW: 16.4 % — ABNORMAL HIGH (ref 11.5–15.5)
RDW: 17 % — ABNORMAL HIGH (ref 11.5–15.5)
RDW: 16.3 % — ABNORMAL HIGH (ref 11.5–15.5)
RDW: 16.4 % — ABNORMAL HIGH (ref 11.5–15.5)
WBC: 10.1 10*3/uL (ref 4.0–10.5)
WBC: 10.2 10*3/uL (ref 4.0–10.5)
WBC: 7.3 10*3/uL (ref 4.0–10.5)
WBC: 9.1 10*3/uL (ref 4.0–10.5)
WBC: 9.2 10*3/uL (ref 4.0–10.5)
WBC: 9.4 10*3/uL (ref 4.0–10.5)
WBC: 9.4 10*3/uL (ref 4.0–10.5)
WBC: 8.3 10*3/uL (ref 4.0–10.5)
WBC: 8.3 10*3/uL (ref 4.0–10.5)
WBC: 8.7 10*3/uL (ref 4.0–10.5)

## 2010-10-04 LAB — BLOOD GAS, ARTERIAL
Bicarbonate: 27.5 mEq/L — ABNORMAL HIGH (ref 20.0–24.0)
FIO2: 0.21 %
O2 Saturation: 76 %
O2 Saturation: 95.4 %
Patient temperature: 98.6
Patient temperature: 98.6
TCO2: 28.7 mmol/L (ref 0–100)
pH, Arterial: 7.483 — ABNORMAL HIGH (ref 7.350–7.450)
pO2, Arterial: 65.4 mmHg — ABNORMAL LOW (ref 80.0–100.0)

## 2010-10-04 LAB — GLUCOSE, CAPILLARY: Glucose-Capillary: 118 mg/dL — ABNORMAL HIGH (ref 70–99)

## 2010-10-04 LAB — VANCOMYCIN, TROUGH: Vancomycin Tr: 9.9 ug/mL — ABNORMAL LOW (ref 10.0–20.0)

## 2010-10-04 LAB — URINALYSIS, MICROSCOPIC ONLY
Glucose, UA: NEGATIVE mg/dL
Hgb urine dipstick: NEGATIVE
Specific Gravity, Urine: 1.024 (ref 1.005–1.030)
Urobilinogen, UA: 2 mg/dL — ABNORMAL HIGH (ref 0.0–1.0)

## 2010-10-04 LAB — BASIC METABOLIC PANEL
BUN: 10 mg/dL (ref 6–23)
BUN: 10 mg/dL (ref 6–23)
BUN: 11 mg/dL (ref 6–23)
BUN: 7 mg/dL (ref 6–23)
BUN: 10 mg/dL (ref 6–23)
BUN: 15 mg/dL (ref 6–23)
CO2: 29 mEq/L (ref 19–32)
CO2: 31 mEq/L (ref 19–32)
CO2: 25 mEq/L (ref 19–32)
Calcium: 8.2 mg/dL — ABNORMAL LOW (ref 8.4–10.5)
Calcium: 8.4 mg/dL (ref 8.4–10.5)
Calcium: 8.7 mg/dL (ref 8.4–10.5)
Calcium: 7.8 mg/dL — ABNORMAL LOW (ref 8.4–10.5)
Calcium: 8.2 mg/dL — ABNORMAL LOW (ref 8.4–10.5)
Chloride: 102 mEq/L (ref 96–112)
Chloride: 102 mEq/L (ref 96–112)
Chloride: 103 mEq/L (ref 96–112)
Chloride: 95 mEq/L — ABNORMAL LOW (ref 96–112)
Chloride: 104 mEq/L (ref 96–112)
Chloride: 105 mEq/L (ref 96–112)
Chloride: 107 mEq/L (ref 96–112)
Creatinine, Ser: 0.97 mg/dL (ref 0.4–1.5)
Creatinine, Ser: 1.34 mg/dL (ref 0.4–1.5)
Creatinine, Ser: 1.43 mg/dL (ref 0.4–1.5)
Creatinine, Ser: 1.56 mg/dL — ABNORMAL HIGH (ref 0.4–1.5)
Creatinine, Ser: 1.2 mg/dL (ref 0.4–1.5)
Creatinine, Ser: 1.27 mg/dL (ref 0.4–1.5)
GFR calc Af Amer: 58 mL/min — ABNORMAL LOW (ref >60)
GFR calc Af Amer: >60 mL/min (ref >60)
GFR calc Af Amer: >60 mL/min (ref >60)
GFR calc Af Amer: >60 mL/min (ref >60)
GFR calc Af Amer: >60 mL/min (ref >60)
GFR calc Af Amer: >60 mL/min (ref >60)
GFR calc Af Amer: >60 mL/min (ref >60)
GFR calc non Af Amer: >60 mL/min (ref >60)
GFR calc non Af Amer: 55 mL/min — ABNORMAL LOW (ref >60)
GFR calc non Af Amer: >60 mL/min (ref >60)
Glucose, Bld: 99 mg/dL (ref 70–99)
Potassium: 3.2 mEq/L — ABNORMAL LOW (ref 3.5–5.1)
Potassium: 3.3 mEq/L — ABNORMAL LOW (ref 3.5–5.1)
Potassium: 3.6 mEq/L (ref 3.5–5.1)
Potassium: 3.8 mEq/L (ref 3.5–5.1)
Sodium: 131 mEq/L — ABNORMAL LOW (ref 135–145)
Sodium: 135 mEq/L (ref 135–145)
Sodium: 136 mEq/L (ref 135–145)
Sodium: 141 mEq/L (ref 135–145)
Sodium: 137 mEq/L (ref 135–145)

## 2010-10-04 LAB — COMPREHENSIVE METABOLIC PANEL
ALT: 23 U/L (ref 0–53)
AST: 31 U/L (ref 0–37)
AST: 24 U/L (ref 0–37)
Albumin: 2.3 g/dL — ABNORMAL LOW (ref 3.5–5.2)
Albumin: 3.4 g/dL — ABNORMAL LOW (ref 3.5–5.2)
Alkaline Phosphatase: 73 U/L (ref 39–117)
CO2: 34 mEq/L — ABNORMAL HIGH (ref 19–32)
Chloride: 101 mEq/L (ref 96–112)
Chloride: 102 mEq/L (ref 96–112)
Creatinine, Ser: 1.34 mg/dL (ref 0.4–1.5)
GFR calc Af Amer: >60 mL/min (ref >60)
GFR calc Af Amer: >60 mL/min (ref >60)
GFR calc non Af Amer: 51 mL/min — ABNORMAL LOW (ref >60)
Potassium: 3.6 mEq/L (ref 3.5–5.1)
Potassium: 3.9 mEq/L (ref 3.5–5.1)
Sodium: 141 mEq/L (ref 135–145)
Sodium: 135 mEq/L (ref 135–145)
Total Bilirubin: 1.3 mg/dL — ABNORMAL HIGH (ref 0.3–1.2)
Total Bilirubin: 1.1 mg/dL (ref 0.3–1.2)
Total Protein: 6.7 g/dL (ref 6.0–8.3)

## 2010-10-04 LAB — DIFFERENTIAL
Basophils Absolute: 0 10*3/uL (ref 0.0–0.1)
Basophils Relative: 0 % (ref 0–1)
Basophils Relative: 0 % (ref 0–1)
Eosinophils Absolute: 0.2 10*3/uL (ref 0.0–0.7)
Eosinophils Absolute: 0.3 10*3/uL (ref 0.0–0.7)
Eosinophils Absolute: 0.4 10*3/uL (ref 0.0–0.7)
Eosinophils Relative: 2 % (ref 0–5)
Eosinophils Relative: 4 % (ref 0–5)
Lymphocytes Relative: 10 % — ABNORMAL LOW (ref 12–46)
Lymphocytes Relative: 8 % — ABNORMAL LOW (ref 12–46)
Lymphocytes Relative: 8 % — ABNORMAL LOW (ref 12–46)
Lymphs Abs: 0.8 10*3/uL (ref 0.7–4.0)
Lymphs Abs: 1 10*3/uL (ref 0.7–4.0)
Lymphs Abs: 1.1 10*3/uL (ref 0.7–4.0)
Monocytes Absolute: 1 10*3/uL (ref 0.1–1.0)
Monocytes Absolute: 1.2 10*3/uL — ABNORMAL HIGH (ref 0.1–1.0)
Monocytes Absolute: 1.2 10*3/uL — ABNORMAL HIGH (ref 0.1–1.0)
Monocytes Relative: 10 % (ref 3–12)
Monocytes Relative: 11 % (ref 3–12)
Monocytes Relative: 12 % (ref 3–12)
Neutro Abs: 5 10*3/uL (ref 1.7–7.7)
Neutro Abs: 7.7 10*3/uL (ref 1.7–7.7)
Neutrophils Relative %: 69 % (ref 43–77)
Neutrophils Relative %: 76 % (ref 43–77)
Neutrophils Relative %: 77 % (ref 43–77)

## 2010-10-04 LAB — PROTIME-INR
INR: 2.73 — ABNORMAL HIGH (ref 0.00–1.49)
INR: 3.11 — ABNORMAL HIGH (ref 0.00–1.49)
INR: 3.45 — ABNORMAL HIGH (ref 0.00–1.49)
INR: 2.23 — ABNORMAL HIGH (ref 0.00–1.49)
Prothrombin Time: 31.8 seconds — ABNORMAL HIGH (ref 11.6–15.2)
Prothrombin Time: 23.6 seconds — ABNORMAL HIGH (ref 11.6–15.2)
Prothrombin Time: 24.5 seconds — ABNORMAL HIGH (ref 11.6–15.2)

## 2010-10-04 LAB — IRON AND TIBC
Iron: <10 ug/dL — ABNORMAL LOW (ref 42–135)
UIBC: 243 ug/dL

## 2010-10-04 LAB — CARDIAC PANEL(CRET KIN+CKTOT+MB+TROPI)
CK, MB: 1 ng/mL (ref 0.3–4.0)
Relative Index: INVALID (ref 0.0–2.5)
Total CK: 117 U/L (ref 7–232)
Total CK: 128 U/L (ref 7–232)
Troponin I: 0.01 ng/mL (ref 0.00–0.06)

## 2010-10-04 LAB — CROSSMATCH
ABO/RH(D): O POS
Antibody Screen: NEGATIVE

## 2010-10-04 LAB — CULTURE, BLOOD (ROUTINE X 2)
Culture: NO GROWTH
Culture: NO GROWTH

## 2010-10-04 LAB — URINE CULTURE
Colony Count: NO GROWTH
Culture: NO GROWTH

## 2010-10-04 LAB — CLOSTRIDIUM DIFFICILE EIA: C difficile Toxins A+B, EIA: NEGATIVE

## 2010-10-04 LAB — SEDIMENTATION RATE: Sed Rate: 94 mm/hr — ABNORMAL HIGH (ref 0–16)

## 2010-10-04 LAB — BRAIN NATRIURETIC PEPTIDE: Pro B Natriuretic peptide (BNP): 398 pg/mL — ABNORMAL HIGH (ref 0.0–100.0)

## 2010-10-04 LAB — CK TOTAL AND CKMB (NOT AT ARMC)
Relative Index: INVALID (ref 0.0–2.5)
Total CK: 76 U/L (ref 7–232)

## 2010-10-04 LAB — LIPASE, BLOOD: Lipase: 21 U/L (ref 11–59)

## 2010-10-04 LAB — MAGNESIUM
Magnesium: 2 mg/dL (ref 1.5–2.5)
Magnesium: 2.2 mg/dL (ref 1.5–2.5)

## 2010-10-04 LAB — EXPECTORATED SPUTUM ASSESSMENT W GRAM STAIN, RFLX TO RESP C

## 2010-10-04 LAB — VITAMIN B12: Vitamin B-12: 599 pg/mL (ref 211–911)

## 2010-10-04 LAB — PHOSPHORUS
Phosphorus: 3.4 mg/dL (ref 2.3–4.6)
Phosphorus: 3.7 mg/dL (ref 2.3–4.6)

## 2010-10-04 LAB — ABO/RH: ABO/RH(D): O POS

## 2010-10-04 LAB — FERRITIN: Ferritin: 112 ng/mL (ref 22–322)

## 2010-10-04 LAB — RHEUMATOID FACTOR: Rhuematoid fact SerPl-aCnc: <20 IU/mL (ref 0–20)

## 2010-10-04 LAB — MRSA PCR SCREENING: MRSA by PCR: NEGATIVE

## 2010-10-10 LAB — DIFFERENTIAL
Basophils Absolute: 0 10*3/uL (ref 0.0–0.1)
Basophils Relative: 0 % (ref 0–1)
Eosinophils Absolute: 0 10*3/uL (ref 0.0–0.7)
Eosinophils Relative: 0 % (ref 0–5)
Monocytes Absolute: 0.8 10*3/uL (ref 0.1–1.0)

## 2010-10-10 LAB — CBC
Platelets: 196 10*3/uL (ref 150–400)
RBC: 4.11 MIL/uL — ABNORMAL LOW (ref 4.22–5.81)
WBC: 7.7 10*3/uL (ref 4.0–10.5)

## 2010-10-10 LAB — COMPREHENSIVE METABOLIC PANEL
ALT: 19 U/L (ref 0–53)
AST: 35 U/L (ref 0–37)
Albumin: 4 g/dL (ref 3.5–5.2)
Alkaline Phosphatase: 84 U/L (ref 39–117)
CO2: 29 mEq/L (ref 19–32)
Chloride: 102 mEq/L (ref 96–112)
GFR calc Af Amer: >60 mL/min (ref >60)
Potassium: 4.6 mEq/L (ref 3.5–5.1)
Total Bilirubin: 0.8 mg/dL (ref 0.3–1.2)

## 2010-10-10 LAB — URINALYSIS, ROUTINE W REFLEX MICROSCOPIC
Glucose, UA: NEGATIVE mg/dL
Protein, ur: NEGATIVE mg/dL
Specific Gravity, Urine: 1.017 (ref 1.005–1.030)
Urobilinogen, UA: 1 mg/dL (ref 0.0–1.0)

## 2010-10-10 LAB — SAMPLE TO BLOOD BANK

## 2010-11-04 ENCOUNTER — Telehealth: Payer: Self-pay | Admitting: Cardiology

## 2010-11-04 NOTE — Telephone Encounter (Signed)
Spoke with pt who states he is having frequent chest pain and is very weak.  States this has been going on for a few weeks but is getting worse.  He reports that he is now staggering he is so weak.  Pt is instructed to have someone take him to the ER at [facility], not to drive himself.  Pt is agreeable and Rosann Auerbach has been notified.  Will fax ED that pt is coming.

## 2010-11-04 NOTE — Telephone Encounter (Signed)
Pt having chest pain off and on with some sob for about 2 weeks

## 2010-11-10 ENCOUNTER — Ambulatory Visit (INDEPENDENT_AMBULATORY_CARE_PROVIDER_SITE_OTHER): Payer: Medicare Other | Admitting: Cardiology

## 2010-11-10 ENCOUNTER — Encounter: Payer: Self-pay | Admitting: Cardiology

## 2010-11-10 VITALS — BP 112/72 | HR 59 | Resp 18 | Ht 72.0 in | Wt 201.0 lb

## 2010-11-10 DIAGNOSIS — I4891 Unspecified atrial fibrillation: Secondary | ICD-10-CM

## 2010-11-10 DIAGNOSIS — I2510 Atherosclerotic heart disease of native coronary artery without angina pectoris: Secondary | ICD-10-CM

## 2010-11-10 DIAGNOSIS — I10 Essential (primary) hypertension: Secondary | ICD-10-CM

## 2010-11-10 DIAGNOSIS — E785 Hyperlipidemia, unspecified: Secondary | ICD-10-CM

## 2010-11-10 DIAGNOSIS — I080 Rheumatic disorders of both mitral and aortic valves: Secondary | ICD-10-CM

## 2010-11-10 NOTE — Assessment & Plan Note (Signed)
I did note that his recent LDL was only 35 HDL 72. I will reduce his pravastatin 40 mg daily.

## 2010-11-10 NOTE — Assessment & Plan Note (Signed)
The blood pressure is at target. No change in medications is indicated. We will continue with therapeutic lifestyle changes (TLC).  

## 2010-11-10 NOTE — Assessment & Plan Note (Signed)
He has unstable chest discomfort that has been unchanged since a stress test in November of 2010. No change in therapy or further evaluation as indicated. He will continue with risk reduction.

## 2010-11-10 NOTE — Assessment & Plan Note (Signed)
He maintained sinus rhythm. He said his TSH and liver enzymes checked recently. He had an extensive ophthalmologic exam. These x-rays fairly recently with a recent pneumonia. He is off of Coumadin because of a mottling that he had on his lower extremities while in the hospital.  I don't believe he needs the Coumadin now as he is maintaining sinus rhythm. No change in therapy is indicated.

## 2010-11-10 NOTE — Assessment & Plan Note (Signed)
He had an echo in May of last year with only mild MR and preserved EF. No further imaging is indicated.

## 2010-11-10 NOTE — Progress Notes (Signed)
HPI Patient presents for followup of his known cardiovascular problems. He has had no acute complaints since I last saw him. His main issue continues to be fatigue as he has been for years. He says his exercise is reduced because of this. He does do some walking but as much as he used to. He denies any chest pressure, neck or arm discomfort with activities. He will occasionally get some discomfort at night and will take a nitroglycerin with relief. However, this has been a stable pattern. Is not having any new shortness of breath, PND or orthopnea. He is not having any new weight gain or edema.  No Known Allergies  Current Outpatient Prescriptions  Medication Sig Dispense Refill  . amiodarone (PACERONE) 200 MG tablet Take 200 mg by mouth daily.        [name] amLODipine (NORVASC) 2.5 MG tablet Take 10 mg by mouth daily.        [name] aspirin 81 MG tablet Take 81 mg by mouth daily.        . clonazePAM (KLONOPIN) 1 MG tablet Take 1 mg by mouth at bedtime.        . Coenzyme Q10 (CO Q 10) 100 MG CAPS Take 1 capsule by mouth daily.        [name] levothyroxine (SYNTHROID, LEVOTHROID) 25 MCG tablet Take 0.05 mcg by mouth daily.       . nitroGLYCERIN (NITROSTAT) 0.4 MG SL tablet Place 0.4 mg under the tongue every 5 (five) minutes as needed.        . pantoprazole (PROTONIX) 40 MG tablet Take 40 mg by mouth 2 (two) times daily.        . pravastatin (PRAVACHOL) 80 MG tablet Take 80 mg by mouth daily.        . Prenatal Vit-Fe Fumarate-FA (M-VIT) tablet Take 1 tablet by mouth daily.        . traZODone (DESYREL) 50 MG tablet Take 50 mg by mouth at bedtime.        [name] DISCONTD: amLODipine (NORVASC) 2.5 MG tablet Take 1 tablet (2.5 mg total) by mouth daily.  90 tablet  11    Past Medical History  Diagnosis Date  . HYPOTHYROIDISM 05/20/2007  . HYPERLIPIDEMIA 10/20/2008  . DEPRESSION 12/17/2007  . MITRAL REGURGITATION 10/20/2008  . HYPERTENSION 04/10/2007  . CORONARY ARTERY DISEASE 04/10/2007  . Atrial fibrillation 04/12/2007  .  GERD 04/10/2007  . DIVERTICULOSIS, COLON 04/10/2007  . BENIGN PROSTATIC HYPERTROPHY 04/10/2007  . GYNECOMASTIA, UNILATERAL 03/23/2010  . SLEEP APNEA 05/20/2007  . WEAKNESS 11/09/2009  . CHEST PAIN 05/11/2009  . Hematoma of abdominal wall   . Clotting disorder     anticoag. therapy    Past Surgical History  Procedure Date  . Transurethral resection of prostate     ROS:  Very fatigued.  Otherwise as stated in the HPI and negative for all other systems.  PHYSICAL EXAM BP 112/72  Pulse 59  Resp 18  Ht 6' (1.829 m)  Wt 201 lb (91.173 kg)  BMI 27.26 kg/m2 GENERAL:  Well appearing HEENT:  Pupils equal round and reactive, fundi not visualized, oral mucosa unremarkable NECK:  No jugular venous distention, waveform within normal limits, carotid upstroke brisk and symmetric, no bruits, no thyromegaly LYMPHATICS:  No cervical, inguinal adenopathy LUNGS:  Clear to auscultation bilaterally BACK:  No CVA tenderness CHEST:  Unremarkable HEART:  PMI not displaced or sustained,S1 and S2 within normal limits, no S3, no S4, no clicks, no rubs, no murmurs  ABD:  Flat, positive bowel sounds normal in frequency in pitch, no bruits, no rebound, no guarding, no midline pulsatile mass, no hepatomegaly, no splenomegaly EXT:  2 plus pulses throughout, no edema, no cyanosis no clubbing SKIN:  No rashes no nodules NEURO:  Cranial nerves II through XII grossly intact, motor grossly intact throughout PSYCH:  Cognitively intact, oriented to person place and time  EKG:  Sinusitis S. Office rate 59, axis within normal limits, nonspecific T-wave flattening  ASSESSMENT AND PLAN

## 2010-11-10 NOTE — Patient Instructions (Signed)
Decrease Pravastatin to 40 mg a day Continue other medications as ordered Follow up in 6 months with Dr Antoine Poche

## 2010-11-16 ENCOUNTER — Encounter: Payer: Self-pay | Admitting: Internal Medicine

## 2010-11-16 ENCOUNTER — Ambulatory Visit (INDEPENDENT_AMBULATORY_CARE_PROVIDER_SITE_OTHER): Payer: Medicare Other | Admitting: Internal Medicine

## 2010-11-16 VITALS — BP 120/70 | HR 58 | Temp 97.8°F | Resp 16 | Ht 69.0 in | Wt 200.0 lb

## 2010-11-16 DIAGNOSIS — E785 Hyperlipidemia, unspecified: Secondary | ICD-10-CM

## 2010-11-16 DIAGNOSIS — R5383 Other fatigue: Secondary | ICD-10-CM

## 2010-11-16 DIAGNOSIS — R5381 Other malaise: Secondary | ICD-10-CM

## 2010-11-16 DIAGNOSIS — Z0000 Encounter for general adult medical examination without abnormal findings: Secondary | ICD-10-CM

## 2010-11-16 DIAGNOSIS — I10 Essential (primary) hypertension: Secondary | ICD-10-CM

## 2010-11-16 DIAGNOSIS — I2510 Atherosclerotic heart disease of native coronary artery without angina pectoris: Secondary | ICD-10-CM

## 2010-11-16 DIAGNOSIS — Z23 Encounter for immunization: Secondary | ICD-10-CM

## 2010-11-16 DIAGNOSIS — I4891 Unspecified atrial fibrillation: Secondary | ICD-10-CM

## 2010-11-16 DIAGNOSIS — E039 Hypothyroidism, unspecified: Secondary | ICD-10-CM

## 2010-11-16 NOTE — Progress Notes (Signed)
Subjective:    Patient ID: [name], male    DOB: 1927/07/02, 77 y.o.   MRN: [id number]  HPI in an 77 year old patient who is seen today for an annual examination. He is followed closely by cardiology and has also had a recent urological evaluation. He is followed closely at the [facility] and has had a recent comprehensive laboratory panel. He is doing well today except for his chronic fatigue. He is considering participation again in cardiac rehabilitation. He denies any exertional symptoms. He does take Klonopin at bedtime due to periodic leg movements during sleep which has been quite helpful. He has failed discontinuation of this medication in the past. It was suggested that he try decreasing his dose by one half.  1. Risk factors, based on past  M,S,F history- patient has a history of coronary artery disease cardiovascular risk factors include dyslipidemia and hypertension  2.  Physical activities: Fairly sedentary due to chronic weakness no exercise limitations. We'll consider participation in cardiac rehabilitation  3.  Depression/mood: History of depression;  patient has been on SSRI therapy in the past  4.  Hearing: Uses hearing aids bilaterally  5.  ADL's: Independent in all aspects of daily living  6.  Fall risk: Low to moderate. 2 weakness  7.  Home safety: No problems identified  8.  Height weight, and visual acuity; height and weight stable the patient has had a recent extensive visual acuity evaluation at the [facility] wears glasses  9.  Counseling: More  regular exercise encouraged as well as modest weight loss  10. Lab orders based on risk factors: None appropriate at this time  11. Referral: Periodic followup urology and cardiology as well as [facility]  12. Care plan: The patient will attempt a more active exercise regimen and modest weight loss  13. Cognitive assessment: Alert and oriented with normal affect no cognitive dysfunction        Review of  Systems  Constitutional: Negative for fever, chills, activity change, appetite change and fatigue.  HENT: Negative for hearing loss, ear pain, congestion, rhinorrhea, sneezing, mouth sores, trouble swallowing, neck pain, neck stiffness, dental problem, voice change, sinus pressure and tinnitus.   Eyes: Negative for photophobia, pain, redness and visual disturbance.  Respiratory: Negative for apnea, cough, choking, chest tightness, shortness of breath and wheezing.   Cardiovascular: Negative for chest pain, palpitations and leg swelling.  Gastrointestinal: Negative for nausea, vomiting, abdominal pain, diarrhea, constipation, blood in stool, abdominal distention, anal bleeding and rectal pain.  Genitourinary: Negative for dysuria, urgency, frequency, hematuria, flank pain, decreased urine volume, discharge, penile swelling, scrotal swelling, difficulty urinating, genital sores and testicular pain.  Musculoskeletal: Negative for myalgias, back pain, joint swelling, arthralgias and gait problem.  Skin: Negative for color change, rash and wound.  Neurological: Positive for weakness. Negative for dizziness, tremors, seizures, syncope, facial asymmetry, speech difficulty, light-headedness, numbness and headaches.  Hematological: Negative for adenopathy. Does not bruise/bleed easily.  Psychiatric/Behavioral: Negative for suicidal ideas, hallucinations, behavioral problems, confusion, sleep disturbance, self-injury, dysphoric mood, decreased concentration and agitation. The patient is not nervous/anxious.        Objective:   Physical Exam  Constitutional: He appears well-developed and well-nourished.  HENT:  Head: Normocephalic and atraumatic.  Right Ear: External ear normal.  Left Ear: External ear normal.  Nose: Nose normal.  Mouth/Throat: Oropharynx is clear and moist.  Eyes: Conjunctivae and EOM are normal. Pupils are equal, round, and reactive to light. No scleral icterus.  Neck:  Normal range  of motion. Neck supple. No JVD present. No thyromegaly present.  Cardiovascular: Regular rhythm, normal heart sounds and intact distal pulses.  Exam reveals no gallop and no friction rub.   No murmur heard.      Pedal pulses decreased on the left  Pulmonary/Chest: Effort normal and breath sounds normal. He exhibits no tenderness.  Abdominal: Soft. Bowel sounds are normal. He exhibits no distension and no mass. There is no tenderness.  Genitourinary: Penis normal. No penile tenderness.  Musculoskeletal: Normal range of motion. He exhibits no edema and no tenderness.  Lymphadenopathy:    He has no cervical adenopathy.  Neurological: He is alert. He has normal reflexes. No cranial nerve deficit. Coordination normal.  Skin: Skin is warm and dry. No rash noted.  Psychiatric: He has a normal mood and affect. His behavior is normal.          Assessment & Plan:   Annual health assessment Coronary artery disease. Clinically stable Hypertension well controlled Periodic leg movement disorder during sleep. We'll try to decrease his Klonopin to one half milligram at bedtime  Return office visit 6 months

## 2010-11-16 NOTE — Patient Instructions (Signed)
It is important that you exercise regularly, at least 20 minutes 3 to 4 times per week.  If you develop chest pain or shortness of breath seek  medical attention.  Limit your sodium (Salt) intake  Return in 6 months for follow-up  

## 2010-11-29 ENCOUNTER — Telehealth: Payer: Self-pay | Admitting: Cardiology

## 2010-11-29 NOTE — H&P (Signed)
[name], BRENNEMAN NO.:  [id number]   MEDICAL RECORD NO.:  [id number]          PATIENT TYPE:  INP   LOCATION:  4740                         FACILITY:  [facility]   PHYSICIAN:  [name]. [name], MD, FACCDATE OF BIRTH:  04/26/1927   DATE OF ADMISSION:  12/05/2007  DATE OF DISCHARGE:                              HISTORY & PHYSICAL   HISTORY:  [name] is a 77 year old white male who is referred to  [name] for cardiac rehab secondary to chest  discomfort.  Mr. Albor describes a long history of generalized  weakness.  Dr. Antoine Poche last Thursday on an office visit discontinued  the patient's Cartia and Zocor, which was changed to pravastatin in  hopes to improve his generalized weakness.  However, since that time,  the patient has not noticed any changes except that there are few times  that he has checked his blood pressure.  He and his wife both think it  is higher than usual.  This morning when he got up, he checked his blood  pressure and it was 170/100.  He had a headache, which he states that he  has had a headache for several days.  He went to cardiac rehab as usual  and while waiting for the previous class to finish, he developed an  anterior left-sided chest throbbing sensation associated with shortness  of breath.  He denies associated radiation, nausea, vomiting, or  diaphoresis.  He gave it a 3 on a scale of  0/10.  He told the RN who  checked his blood pressure, specifics are unavailable.  He then stated  that he received oxygen, which reduced his comfort to a 0.  Now in the  emergency room, he states that for the last few minutes, he has noticed  a dull ache.  He feels that the throbbing sensation resembles his  myocardial infarction in 2006, but not as bad; however, he describes the  discomfort in 2006, is a truck sitting on his chest, radiating into his  left arm.   ALLERGIES:  No known drug allergies.   MEDICATIONS PRIOR TO  ADMISSION:  Include:  1. Aspirin 81 mg daily.  2. Coumadin 5 mg daily, except 2.5 mg on Monday and Friday.  3. Coenzyme Q10 daily.  4. Protonix 40 mg b.i.d.  5. Synthroid 25 mcg daily.  6. Multivitamin daily.  7. Amiodarone 200 mg daily.  8. Pravastatin 80 mg nightly.  9. Trazodone 50 mg nightly.  10.Clonazepam 1 mg nightly.   MEDICAL HISTORY:  Notable for hypertension and hyperlipidemia.  Last  check was in August 2008 showed a total cholesterol 114, triglycerides  61, HDL 47, and LDL 55.  He has a history of hypothyroidism.  Last TSH  on August 2008 was 3.745.  He has a history of BPH with TURP in 2001,  depression, anxiety, chronic renal insufficiency, obstructive sleep  apnea with CPAP, paroxysmal atrial fibrillation since 2005 with DC  cardioversions in 2005 and in August 2008.  Coumadin is regulated by Dr.  Ludwig Clarks office.  He has a known  history of coronary artery disease  with preserved LV function.  He had an inferior myocardial infarction in  2006 with stent to the RCA and a subsequent stent to the OM in 2002.  He  has known peripheral vascular disease with a right popliteal aneurysm  repair, GERD with Barrett's esophagitis, and Mallory-Weiss tears.   SOCIAL HISTORY:  He resides in [place] with his wife.  He has 1 son  and an adopted daughter.  He has 2 biological grandchildren.  He is  retired from [employer].  He has never smoked.  Denies alcohol,  drugs, or herbal medication.  Maintains a low-salt diet and participates  in cardiac rehabs 3 times a week.   FAMILY HISTORY:  Mother died at the age of 53, possibly related to a  heart aneurysm.  His father died at the age of 34, complications post  fall/head injury.  He has a brother who is 49, who had a bypass in his  68s.   REVIEW OF SYSTEMS:  In addition to the above, it is notable for 6-pound  weight gain in the last 2-3 months.  Very rare nosebleed,  occasional  headaches, glasses, left hearing aid,  occasional dyspnea on exertion,  urinary straining, recent evaluation by Dr. Vonita Moss for a testicular  knot, which was described as a cyst, generalized weakness, depression,  anxiety.  Wife states that the patient has to hold on to things while  walking around based on his balance,  dysphagia with Synthroid, GERD,  and constipation.  All other systems are unremarkable.   PHYSICAL EXAMINATION:  GENERAL:  A well-nourished, well-developed  pleasant white male in no apparent distress.  Wife is present.  VITAL SIGNS:  Blood pressure is 188/102, temperature 97, pulse 64,  respirations 20, and 100% sat on 4 L.  HEENT:  Unremarkable.  NECK:  Supple without thyromegaly, adenopathy, JVD, or carotid bruits.  CHEST:  Symmetrical excursion.  LUNGS:  Sounds are slightly diminished, but clear to auscultation.  HEART:  Regular rate and rhythm with a normal S1 and S2.  I do not  appreciate murmurs, rubs, clicks, or gallops.  All pulses are  symmetrical and intact without abdominal or femoral bruits.  SKIN:  Integument is also intact.  ABDOMEN:  Slightly rounded.  Bowel sounds present without organomegaly,  masses, or tenderness.  EXTREMITIES:  Negative for cyanosis, clubbing, or edema.  MUSCULOSKELETAL/NEURO:  Unremarkable.   Chest x-ray shows cardiomegaly.  EKG shows sinus bradycardia with  incomplete right bundle-branch block, essentially no change from prior  EKGs.  Laboratory values are pending at the time of this dictation.   IMPRESSION:  1. Atypical chest discomfort with a negative adenosine Myoview on Dec 04, 2007.  Initial EKG is unremarkable.  2. Hypertension, which does not sound like it was controlled prior to      the cessation of his calcium channel blocker; however, now it is      worse.  3. Generalized weakness with possible gait disturbance of uncertain      etiology, history as noted per past medical history.   DISPOSITION:  Dr. [name] reviewed the patient's history,  spoke with  and examined the patient and agrees with the above.  We will admit him  for observation and cycle enzymes.  Since his enzymes are negative, I do  not feel that he needs any further ischemic evaluation except  reassurance given his recent normal Myoview.  We will add Norvasc to  his  blood pressure regimen for his hypertension.  He will need early  followup with Dr. Antoine Poche.      Joellyn Rued, PA-C      [name] [name], MD, [facility]  Electronically Signed    EW/MEDQ  D:  12/05/2007  T:  12/06/2007  Job:  [id number]   cc:   Coralyn Helling, MD  Rollene Rotunda, MD, [facility]  Heather M. [place], [place]  Gordy Savers, MD

## 2010-11-29 NOTE — Discharge Summary (Signed)
NAMEJASANI, [name] NO.:  [id number]   MEDICAL RECORD NO.:  [id number]          PATIENT TYPE:  INP   LOCATION:  2014                         FACILITY:  [facility]   PHYSICIAN:  Maple Mirza, PA   DATE OF BIRTH:  03-05-1927   DATE OF ADMISSION:  02/13/2007  DATE OF DISCHARGE:  02/21/2007                               DISCHARGE SUMMARY   ALLERGIES:  No known drug allergies.   FINAL DIAGNOSES:  1. Fatigue and chest pain with recurrent atrial fibrillation.  2. Admitted for Tikosyn therapy.  The patient discharging in sinus      rhythm after dose #12 of Tikosyn February 21, 2007.  3. Patient discharging day #3 status post transesophageal      echocardiogram (no left atrial appendage thrombus) and direct      current cardioversion (DCCV).   SECONDARY DIAGNOSES:  1. History of atrial fibrillation on chronic Coumadin with symptoms of      fatigue and chest pain, having failed amiodarone in the past.  2. Known coronary artery disease.      a.     Stent to the obtuse marginal 2002.      b.     Inferior myocardial infarction 2006 with bare metal stent to       the right coronary artery.      c.     Residual coronary artery disease, had catheterization April       2007.      d.     MyoView study February 2007, no ischemia.  3. Moderate to severe mitral regurgitation by transesophageal      echocardiogram February 18, 2007.  4. Hypertension.  5. Dyslipidemia.  6. Gastroesophageal reflux disease/Barrett's esophagus.  7. Preserved left ventricular function.  8. Treated hypothyroidism.  9. Renal insufficiency.  10.Benign prostatic hypertrophy.  11.Obstructive sleep apnea/continuous positive airway pressure.   PROCEDURES THIS ADMISSION:  1. February 18, 2007:  Transesophageal echocardiogram. This study showed      an ejection fraction of 55%.  No left atrial appendix thrombus.      Trace aortic insufficiency, moderate to severe mitral      regurgitation.  2. February 18, 2007:   DCCV with difficult sedation requiring 30 mg of      Valium, 125 mcg of fentanyl.  The patient received 200 Joules      synchronized shock and converted to sinus rhythm.   BRIEF HISTORY:  [name] is a 77 year old male.  He has a history of  coronary artery disease.  He also has paroxysmal atrial fibrillation.   He was recently admitted to [facility] with recurrent atrial  fibrillation in June 2008.  He understand a TEE guided cardioversion.  He maintained sinus rhythm until appearing in the office on January 28, 2007.  At that time he was complaining  of some fatigue.  His Toprol  dose was increased.  He then presented to the office a week later for a  Coumadin clinic follow up.  At that time he noticed worsening fatigue  and he was found to have  a recurrent atrial fibrillation.  His Toprol  was readjusted for better rate control.  He returns to the office today,  February 13, 2007, for follow up.  He continues to feel quite weak.  He gets  short of breath as well as has chest pain.  The chest pain is  intermittent, it comes and goes.  It is a substernal discomfort.  He  denies syncope but does feel lightheaded.  He has some symptoms of  orthopnea but no true paroxysmal nocturnal dyspnea nor pedal edema.  He  notes that the chest discomfort is similar to his previous angina but  not as bad.  He is not having rest pain at this point.  He denies any  exertional chest discomfort.   PLAN:  To admit the patient to the hospital for further evaluation.  His  medications will remain the same.  If his INR is less than 2 he will be  placed on heparin.  Electrophysiology service will make further  recommendations when they see him in the hospital.   HOSPITAL COURSE:  The patient was admitted to [facility] from  the office complaining of fatigue and found to be in recurrent atrial  fibrillation.  He has failed amiodarone in the past with complaints of  chest pain.  His INR was  subtherapeutic on admission, at one point 6.  He was started on intravenous heparin.  He was also chosen for Tikosyn  therapy and this was started on February 14, 2007, at 250 mcg twice a day.  The patient was maintained on intravenous heparin and scheduled for  transesophageal echocardiogram, discontinued cardioversion after 5 doses  of Tikosyn.  He did go to the TEE in atrial fibrillation.  This study  showed no left atrial appendage thrombus and he was subsequently  cardioverted to sinus rhythm.  He has maintained sinus on February 18, 2007, by Dr. Ladona Ridgel.  He has maintained sinus rhythm until the day of  discharge which is 60 hours later.  His INR became therapeutic on February 20, 2007, and this intravenous heparin has been stopped.  His Coumadin  dose has been adjusted upward.  On admission he was 5 mg daily with 7.5  mg on Monday.  This has now been adjusted upward to 5 mg daily except  for 7.5 mg Monday, Wednesday and Friday.  Throughout the spring and  early summer his Toprol has been adjusted upward to try for better rate  control for his atrial fibrillation.  He reports fatigue.  This may be  contributing in some part to his fatigue.  Now that he is in sinus  rhythm with Tikosyn therapy his Toprol has been decreased to levels  which he had originally taken when he was on amiodarone.  He discharges  with the following medications.   DISCHARGE MEDICATIONS:  1. Tikosyn 250 mcg 1 tab in the morning, 1 tab in the evening.  This      is a new medication.  2. Toprol XL 25 mg tablets 1/2 tab in the morning, 1/2 tab in the      evening.  This is a new dose.  3. Protonix 40 mg twice daily.  4. Synthroid 25 mcg daily.  5. Zocor 40 mg daily at bedtime.  6. Clonazepam 1 mg at bedtime.  7. Trazodone 50 mg daily.  8. Number 8 enteric-coated aspirin 325 mg daily.  9. Multivitamin daily.  10.Felodipine 5 mg daily.  11.Coenzyme Q-10 daily.  12.Coumadin 5 mg daily except 7.5 mg or 1-1/2 tabs  Monday, Wednesday,      Friday.  13.Nitroglycerin 0.4 mg 1 tab under the tongue every 5 minutes x3      doses for chest pain.   FOLLOWUP:  He follows up at [facility] [address]   PLAN:  1. Coumadin clinic Tuesday, February 26, 2007, at 9:15.  2. He sees Dr. Antoine Poche Friday, March 01, 2007, at 1:30.  His      Coumadin check for February 22, 2007, has been canceled.  Of note:      Several attempts have been made to contact the [facility], first      the pharmacy which refused to divulge whether they do supply      Tikosyn as part of their formula without his primary caregivers      consent at the [facility].  Second efforts to the [facility]      have been unsuccessful.  It is still, however, that this medication      Tikosyn being of such a specific nature that the [place] would not      dispense this and therefore I think the patient will have to depend      on [facility] for supplies of this in the future.   On the day of discharge, his pro time is 29.6, INR 2.7.  Other  laboratory studies pertinent to this admission, TSH 3.829, BNP on  admission was 319, complete blood count on February 20, 2007, white cell  6.3, hemoglobin 12, hematocrit 35.7, platelets 128.  Serum electrolytes  sodium was 140, potassium 4, chloride 109, carbonate 26, BUN 13,  creatinine 1.38, glucose 93.   This dictation greater than 45 minutes.      Maple Mirza, PA     GM/MEDQ  D:  02/21/2007  T:  02/21/2007  Job:  [id number]   cc:   Doylene Canning. Ladona Ridgel, MD  Gordy Savers, MD  [facility] c/o Dr. Annamaria Boots

## 2010-11-29 NOTE — Op Note (Signed)
NAMEMarland Kitchen  Kevin, [name] NO.:  [id number]   MEDICAL RECORD NO.:  [id number]          PATIENT TYPE:  INP   LOCATION:  2917                         FACILITY:  [facility]   PHYSICIAN:  Jonelle Sidle, MD DATE OF BIRTH:  Apr 01, 1927   DATE OF PROCEDURE:  01/16/2007  DATE OF DISCHARGE:                               OPERATIVE REPORT   REQUESTING CARDIOLOGIST:  Rollene Rotunda, MD.   PROCEDURE:  Transesophageal echocardiogram-guided cardioversion.   PERFORMING CARDIOLOGIST:  Jonelle Sidle, MD.   INDICATION:  Mr. Pollitt is a 77 year old gentleman with a history of  atrial fibrillation and mitral regurgitation who is now admitted to the  hospital with recurrent atrial fibrillation.  He is referred now for a  transesophageal echocardiogram-guided cardioversion and has been placed  on heparin, Coumadin, and Cardizem.   DESCRIPTION OF PROCEDURE:  After informed consent was obtained, the  patient was taken to the Endoscopy Suite.  A transesophageal  echocardiogram was performed prior to cardioversion and is reported in  full elsewhere.  No obvious intracardiac thrombi were noted.  Anterior  and posterior pads were placed in  standard fashion, and following  adequate sedation per the Anesthesia Service, a single shock was  delivered via a biphasic defibrillator at 150 joules with restoration of  normal sinus rhythm.  Initially, ectopic atrial complexes were noted;  however, these resolved after several minutes.  The patient remained  hemodynamically stable throughout, and there were no immediate  complications.   DISPOSITION:  The patient was observed in the Endoscopy Suite recovery  area and ultimately returned to his telemetry room for further  management and followup.      Jonelle Sidle, MD  Electronically Signed     SGM/MEDQ  D:  01/16/2007  T:  01/16/2007  Job:  [id number]   cc:   Rollene Rotunda, MD, [facility]

## 2010-11-29 NOTE — Assessment & Plan Note (Signed)
[facility]                            CARDIOLOGY OFFICE NOTE   [name], [name]                        MRN:          [id number]  DATE:03/01/2007                            DOB:          08/20/1926    REFERRING PHYSICIAN:  Gordy Savers, MD   REASON FOR PRESENTATION:  Evaluate patient with atrial fibrillation.   HISTORY OF PRESENT ILLNESS:  Patient returns for management of his  atrial fibrillation.  He was hospitalized with symptomatic recurrence of  this in early July, and underwent cardioversion.  He has not tolerated  amiodarone in the past.  Unfortunately, he had recurrent symptoms, and  was found to be back in atrial fibrillation in late July.  He was again  admitted to the hospital, but had subtherapeutic Coumadin levels.  He  required TEE guided cardioversion, and was initiated on Tikosyn.   Since going out of the hospital, he has failed to rebound in his usual  fashion.  He has been fatigued.  He has been anxious about doing  activities, as he does not want to bring on the atrial fibrillation.  He  has had no palpitations.  Denies any presyncope or syncope.  He has had  no chest pain, neck or arm discomfort.  He has had no new shortness of  breath, PND, or orthopnea.   Of note, during the most recent admission, his TEE did suggest mitral  regurgitation (which had most recently been moderate at most), and was  more severe.  We have noticed this in the past when he has been in  atrial fibrillation.   PAST MEDICAL HISTORY:  1. Coronary artery disease (catheterization April 2007 with an LAD 40%      tubular lesion at the ostium, mid 50% to 60% stenosis, diagonal 70%      ostial stenosis, circumflex 30% stenosis at the ostium, and 40%      stenosis 1st obtuse marginal, right coronary artery dominant.      Stent had 30% stenosis in the mid right coronary artery.  The      distal stent had 50% to 60% in-stent restenosis.  The PDA had  40%      stenosis.  2. Mitral regurgitation.  3. Atrial fibrillation.  4. Dyslipidemia.  5. Hypertension.  6. Hypothyroidism.  7. Prostatic hypertrophy.  8. Renal insufficiency.  9. Sleep apnea.   ALLERGIES:  NONE.   MEDICATIONS:  1. Protonix 40 mg b.i.d.  2. Synthroid 25 mcg daily.  3. Zocor 40 mg daily.  4. Clonazepam.  5. Trazodone 50 mg daily.  6. Aspirin 325 mg daily.  7. Multivitamin.  8. Co-Enzyme Q.  9. Amlodipine 5 mg daily.  10.Coumadin.  11.Tikosyn 0.25 mcg b.i.d.  12.Toprol 25 mg daily.   REVIEW OF SYSTEMS:  As stated in the HPI, and otherwise negative for  other systems.   PHYSICAL EXAMINATION:  Patient is in no acute distress.  Blood pressure 128/62.  Heart rate 52 and regular.  Weight 193 pounds.  Body mass index 26.  HEENT:  Eyes unremarkable.  Pupils equal,  round, and reactive to light.  Fundi not visualized.  Oral mucosa unremarkable.  NECK:  No jugular venous distention.  Wave form within normal limits.  Carotid upstroke brisk and symmetric.  No bruits.  No thyromegaly.  LYMPHATICS:  No cervical, axillary, or inguinal adenopathy.  LUNGS:  Clear to auscultation bilaterally.  BACK:  No costovertebral angle tenderness.  CHEST:  Unremarkable.  HEART:  PMI not displaced or sustained.  S1 and S2 within normal limits.  No S3.  No S4.  A 2/6 early peaking brief apical systolic murmur without  radiation.  No holosystolic murmurs.  No diastolic murmurs.  ABDOMEN:  Flat.  Positive bowel sounds.  Normal in frequency and pitch.  No bruits.  No rebound.  No guarding.  No midline pulsatile mass.  No  organomegaly.  SKIN:  No rashes.  No nodules.  EXTREMITIES:  Two plus pulses.  No edema.  EKG:  Sinus bradycardia, rate 52, left axis deviation, RSR prime in V1  and V2, no acute ST-T wave changes.   ASSESSMENT AND PLAN:  1. Atrial fibrillation.  The patient is maintaining sinus bradycardia.      I am going to stop his Toprol, as he is somewhat fatigued.  He is       going to continue his Tikosyn.  He has no interval prolongation.      He will remain on the Coumadin.  2. Mitral regurgitation.  This is now severe on TEE.  However, it      often appears severe when he is in atrial fibrillation.  It was      mild to moderate by transthoracic and TEE in sinus rhythm.  I plan      on holding off on further imaging until he has been in sinus rhythm      for a while, and then probably repeat a transthoracic, and possibly      transesophageal echocardiogram.  He is having no new symptoms      related to this.  He has had a well preserved ejection fraction.  3. Coronary disease.  He will continue with risk reduction.  4. Followup.  I would like to see the patient back in about 2 month,      or sooner if needed.     Rollene Rotunda, MD, [facility]  Electronically Signed    JH/MedQ  DD: 03/01/2007  DT: 03/02/2007  Job #: [id number]   cc:   Gordy Savers, MD

## 2010-11-29 NOTE — Assessment & Plan Note (Signed)
[facility]                             PULMONARY OFFICE NOTE   JAMARE, VANATTA                        MRN:          [id number]  DATE:01/07/2007                            DOB:          1927/07/14    PULMONARY FOLLOWUP:  I saw [name] today in followup for his sleep  difficulties.   Since his last visit, he had undergone a titration study using an  adaptive servo-ventilator.  He was ultimately titrated to an EEP  pressure setting of 7 cm/H2O with a minimal pressure of 4 cm/H2O and a  maximal pressure of 10 cm/H2O with a respiratory rate of 12.   He is currently using a hybrid mask with this with a humidifier.  He  says when he first started this, he was having problems with nosebleeds  for the first 2-3 days, but after he increased the settings on his  humidifier, this improved.  He seems to be sleeping quite well, and his  wife does not hear him snoring anymore.  He feels like he has more  energy during the day.  His main complaint with this right now is that  he is getting soreness around his nose from his mask, but he plans on  calling his home care company to be fitted with a different mask.   Otherwise, he continues to use trazodone 50 mg at night as well as  Klonopin 1 mg at night.  He has not had anymore of the unusual behaviors  with this regimen.   I will follow up with Mr. Blasing in approximately four months to assess  his symptom status then, but I believe that he should do reasonably well  with the adaptor servo-ventilator for his central sleep apnea as well as  the Klonopin and the trazodone for his insomnia and abnormal behaviors  of sleep.   It may also be possible that once he is established on the adaptor-servo  ventilator, to try and taper off the use of the trazodone and Klonopin,  depending upon his symptom status.     Coralyn Helling, MD  Electronically Signed    VS/MedQ  DD: 01/07/2007  DT: 01/07/2007  Job #:  [id number]   cc:   Gordy Savers, MD  Rollene Rotunda, MD, [facility]

## 2010-11-29 NOTE — H&P (Signed)
[name], [name] NO.:  [id number]   MEDICAL RECORD NO.:  [id number]          PATIENT TYPE:  INP   LOCATION:  1825                         FACILITY:  [facility]   PHYSICIAN:  Bevelyn Buckles. Bensimhon, MDDATE OF BIRTH:  [birth year]   DATE OF ADMISSION:  01/15/2007  DATE OF DISCHARGE:                              HISTORY & PHYSICAL   PRIMARY CARE PHYSICIAN:  Dr. Amador Cunas.   CARDIOLOGIST:  Dr. Angelina Sheriff.   REASON FOR ADMISSION:  Symptomatic atrial fibrillation.   HISTORY OF PRESENT ILLNESS:  [name] is a very pleasant 77 year old  [name] with multiple medical problems including coronary artery disease  status post several previous stents as well as previous myocardial  infarction in April of 2006.  Also has a history of hypertension and  paroxysmal atrial fibrillation status post cardioversion in March of  2005.   Apparently he has been having some problems with fatigue recently, he  has been seen by Dr. Craige Cotta in sleep clinic as well as by Dr. Antoine Poche in  the cardiology clinic.  He states, however, that over the past 2-3 weeks  his fatigue and exercise intolerance has gotten worse, he has been going  to cardiac rehab.  This morning at cardiac rehab he began to complain  about worsening dyspnea and fatigue.  He was noted to be in atrial  fibrillation with a rapid ventricular response with a heart rate of  about 110 and he was brought to the emergency room.   In the emergency room he was treated with Cardizem and his heart rate is  now 80.  He does have some very mild intermittent chest pain but this is  not exercise limiting.  In reviewing his records, he underwent PCA and  stent to the OM3 in 2002 and 2005 followed by bare metal stent to the  RCA x2 in 2006.  His last catheterization was done by me in April of  2007 which showed some moderate non-flow limiting coronary artery  disease.  He was actually seen by Dr. Tyrone Sage at the time, from [facility],  who did not  think that bypass surgery would help him significantly.  He  does have a normal ejection fraction.   He has noted that his heart has been out of rhythm for several weeks.  Apparently he called our office and his beta-blocker was increased but  he said that this did not help.   REVIEW OF SYSTEMS:  Is notable for occasional nausea and urinary  frequency.  He denies any fevers or chills.  He has had occasional  headache as well as some dizziness.  He notes some pain and fullness in  the back of his left knee.  Remainder of review of systems is negative  except for HPI and problem list.   PROBLEM LIST:  1. Paroxysmal atrial fibrillation.      a.     Status post previous cardioversion in March, 2005, was       maintained on amiodarone for some time.  2. Coronary artery disease.      a.  Status post previous stent to the OM-3 as well as to the       right coronary artery.      b.     Last cardiac catheterization was April of 2007 which showed       moderate non-flow limiting disease.  3. Peripheral arterial disease with known right popliteal aneurysm.  4. Hypertension.  5. Chronic fatigue and dyspnea.  6. Hypothyroidism.  7. Benign prostatic hypertrophy.  8. History of Mallory-Weiss tear.  9. Gastroesophageal reflux disease with associated Barrett's      esophagus.  10.Anxiety.   MEDICATIONS:  1. Klonopin 1 mg q.h.s.  2. Trazodone 50 mg q.h.s.  3. Metoprolol 25 mg t.i.d.  4. Felodipine 5 mg a day.  5. Aspirin 325 a day.  6. Coenzyme Q10.  7. Protonix 40 mg b.i.d.  8. Synthroid 25 mcg a day.  9. Multivitamin.  10.Zocor 40 mg a day.   PHYSICAL EXAM:  He is [name] in no acute distress.  Respirations are unlabored.  Blood pressure is 118/85, heart rate is  currently 90.  He is sating 96% on 2L. He is afebrile.  HEENT:  Normal.  NECK:  Supple.  There is no significant JVD.  Carotids are 2+  bilaterally without any bruits.  There is no lymphadenopathy or   thyromegaly.  CARDIAC:  He has distant heart sounds, he is irregular, mildly  tachycardic.  No obvious murmur, rub or gallop.  LUNGS:  Diminished air movement at the bases, otherwise clear.  No  wheezing.  ABDOMEN:  Soft, nontender, nondistended.  No hepatosplenomegaly, no  bruits, no masses, good bowel sounds.  EXTREMITIES:  Warm and pale.  There is no clubbing, cyanosis or edema.  He does have some mild fullness in the left popliteal fossa but I do not  feel a palpable mass.  NEURO:  He is alert and oriented x3.  Cranial nerves II-XII are intact.  Moves all 4 extremities without difficulty.  Affect is very pleasant.   Initial EKG shows atrial fibrillation with a rapid ventricular response  at 114.  There is some mild T wave flattening in AVL but otherwise no  significant ST-T wave abnormalities.  Point of care markers show a CK MB  of less than 1, troponin I of less than 0.05, D-dimer 0.81, hemoglobin  is 12.9, hematocrit 39.1, white count 6.6, platelets 150, sodium 140,  potassium 4.2, BUN of 14, creatinine 1.3, glucose is 89.   ASSESSMENT:  1. Symptomatic recurrent paroxysmal atrial fibrillation.  2. Coronary artery disease as described above.  3. History of a popliteal aneurysm on the right.   PLAN/DISCUSSION:  I suspect his recurrent atrial fibrillation is the  major driver of his symptoms.  He does have some chest pain but I am  unimpressed with this as the cause of his exercise intolerance and  fatigue.  We will plan to admit him to rule him out for myocardial  infarction.  For now we will start heparin and Coumadin, we will plan  TEE-guided cardioversion tomorrow.  Of note, he was previously on  amiodarone but seems to have maintained normal sinus rhythm for some  time off of it and I think it seems reasonable to see how long he will  hold sinus rhythm without any antiarrhythmic support.  If he does revert  to atrial fibrillation quickly then would consider antiarrhythmic  like  Tikosyn.   Given his fullness in the left popliteal fossa, I think it  is reasonable  to go ahead and ultrasound both sides.  I will discuss with Dr.  Antoine Poche.  Obviously, should his cardiac markers turn positive he will  need a cardiac catheterization.      Bevelyn Buckles. Bensimhon, MD  Electronically Signed     DRB/MEDQ  D:  01/15/2007  T:  01/15/2007  Job:  [id number]

## 2010-11-29 NOTE — Discharge Summary (Signed)
[name]  [name], [name] NO.:  [id number]   MEDICAL RECORD NO.:  [id number]          PATIENT TYPE:  REC   LOCATION:  REHS                         FACILITY:  [facility]   PHYSICIAN:  Luis Abed, MD, FACCDATE OF BIRTH:  11-14-1926   DATE OF ADMISSION:  12/18/2006  DATE OF DISCHARGE:                               DISCHARGE SUMMARY   PRIMARY CARDIOLOGIST:  Dr. Antoine Poche.   PRIMARY CARE DOCTOR:  Dr. Amador Cunas.   DISCHARGING DIAGNOSES:  1. Symptomatic atrial fibrillation, status post transesophageal      echocardiogram-guided cardioversion this admission on January 16, 2007,      by Dr. Simona Huh.  The patient required a single shock delivered      via a biphasic defibrillator at 150 joules with restoration of      normal sinus rhythm.  Status post cardioversion in March 2005 with      the patient maintaining sinus rhythm up until this admission.  2. Anticoagulation therapy with INR of 2.1 at time of discharge.   Past medical history includes:  1. Atrial fibrillation.  2. Coronary artery disease, status post several previous stents as      well as previous MI.  3. Hypertension.  4. Hypothyroidism.  5. GERD with Barrett's esophagus.  6. Anxiety.  7. History of Mallory-Weiss tear.  8. BPH.  9. Peripheral arterial disease with known right popliteal aneurysm.  10.Hypertension.  11.Ongoing fatigue and shortness of breath.   HOSPITAL COURSE:  [name] is a very pleasant 77 year old gentleman  with known history of paroxysmal atrial fibrillation who underwent  cardioversion approximately 2 years ago, has done well since that time  until the last few weeks.  Complained of increased shortness of breath,  increased fatigue and palpitations.  The patient seen.  This admission  EKG showed atrial fibrillation with rapid ventricular response at a rate  of 114.  Ruled out for a myocardial infarction.  The patient  anticoagulated with heparin.  Coumadin therapy initiated,  rate stable  with Cardizem.  The patient underwent transesophageal echocardiogram-  guided cardioversion on January 16, 2007, returned to normal sinus rhythm,  which he has maintained.  The patient is being discharged home with a  therapeutic INR at this time.  Dr. Myrtis Ser in to see the patient on day of  discharge.  Vital signs stable.  Last hemoglobin 12.1.   At time of discharge, medications include:  1. Toprol XL 75 mg daily.  The patient has a new prescription for      this.  2. Protonix 40 mg daily or as previously instructed.  3. Felodipine 5 mg daily or as previously instructed.  4. Synthroid 25 mcg daily.  5. Zocor 40 mg daily.  6. Clonazepam 1 mg daily.  7. Trazodone 50 mg at bedtime.  8. Aspirin 325 daily or as previously prescribed.   The patient has been given a prescription for the Toprol XL and his  Coumadin, which is 5 mg p.o. daily.  I have asked him to follow up with  our office for a Coumadin Clinic visit  this Monday or Tuesday/  office  will call the patient to arrange.  He already has an appointment  scheduled with Dr. Antoine Poche for July 17.  The patient to keep that  appointment.   DURATION OF DISCHARGE ENCOUNTER:  Greater than 30 minutes.      Dorian Pod, ACNP      Luis Abed, MD, [facility]  Electronically Signed    MB/MEDQ  D:  01/19/2007  T:  01/19/2007  Job:  [id number]   cc:   Gordy Savers, MD

## 2010-11-29 NOTE — Discharge Summary (Signed)
NAMEZEDRIC, DEROY NO.:  [id number]   MEDICAL RECORD NO.:  [id number]          PATIENT TYPE:  INP   LOCATION:  2919                         FACILITY:  [facility]   PHYSICIAN:  Noralyn Pick. Eden Emms, MD, FACCDATE OF BIRTH:  08-23-1926   DATE OF ADMISSION:  03/05/2007  DATE OF DISCHARGE:  03/08/2007                               DISCHARGE SUMMARY   PROCEDURES:  None.   FINAL PRIMARY DISCHARGE DIAGNOSIS:  Atrial fibrillation with rapid  ventricular response.   SECONDARY DIAGNOSES:  1. Peripheral vascular disease/right popliteal aneurysm.  2. Benign prostatic hypertrophy.  3. Gastroesophageal reflux disease.  4. Mallory Weiss tear.  5. Anxiety.  6. Status post stent to the obtuse marginal in 2002.  7. History of inferior myocardial infarction in 2006 with stent to the      right coronary artery.  8. Recurrent atrial fibrillation on Tikosyn.  9. Chronic anticoagulation with Coumadin.  10.Status post direct current cardioversion for atrial fibrillation in      2005.   TIME OF DISCHARGE:  35 minutes.   HOSPITAL COURSE:  [name] is a 77 year old male with a history of  coronary artery disease and atrial fib.  He went to cardiac rehab and  was complaining of weakness and fatigue.  He was found to be in atrial  fibrillation with a rate of 120.  He was admitted for further evaluation  and medical management.   Discussion was held whether to increase his dofetilide which had been  250 mcg b.i.d., but because creatinine clearance was between 50 and 60,  decision was made to switch him back to amiodarone.  Amiodarone had been  tried but discontinued because there was concern that it was giving him  chest discomfort.  It was felt that amiodarone might be a better long  term drug for him and he was started on 400 mg t.i.d.   Intravenous diltiazem was used for rate control and changed to p.o.  He  was receiving 60 mg t.i.d. and tolerating this well with no doses held.  He  received two full days of amiodarone with a total load so far of 2.4  grams.  He was continued on his home medications but his beta blocker  was discontinued and his Norvasc was changed to Cardizem.   He was followed by the pharmacy during his hospital stay and his INR  which had been therapeutic on admission at 2.3, remained therapeutic and  at discharge was 2.4.  Cardiac enzymes were cycled and were negative.  TSH was within normal limits at 3.745.  Total cholesterol 114,  triglycerides 61, HDL 47, LDL 55, so he is to continue on Zocor at his  current dose.  By March 08, 2007, Mr. Tanney had a fairly well-  controlled heart rate.  He did tend to run in the low 100s but it was  hoped that this would decrease as the amiodarone continues to load.  Dr.  Eden Emms evaluated Mr. Dross and felt that he could decrease his  amiodarone to 400 b.i.d. for a week and then follow up  with Dr.  Antoine Poche.  His INR will be followed closely in the Coumadin Clinic as  aware.  Consideration can be given to direct current cardioversion in  three weeks if he does not spontaneously convert.  Mr. Shostak was  considered stable for discharge with outpatient follow-up arranged.   DISCHARGE INSTRUCTIONS:  1. His activity level is to be increased slowly.  2. He is to stick to a low sodium heart healthy diet.  3. He is to follow up at the Coumadin Clinic on Wednesday, March 13, 2007, at 12:15 and with Dr. Antoine Poche on March 19, 2007, at      3:15.  He is to follow up with Dr. Gardiner Sleeper as needed.   DISCHARGE MEDICATIONS:  1. Amiodarone 100 mg two tablets b.i.d. for one week and then one      tablet b.i.d.  2. Protonix 40 mg b.i.d.  3. Synthroid 25 mcg daily.  4. Zocor 40 mg a day.  5. Trazodone 50 mg a day.  6. Aspirin 81 mg a day.  7. Cardizem CD 180 mg daily.  8. Multivitamins and coenzyme Q 10 as prior to admission.  9. Clonazepam 1 mg as prior to admission.  10.Toprol XL 25 mg is on hold.   11.Coumadin 5 mg one tablet daily except for a half a tablet on      Monday, Wednesday and Friday.      Theodore Demark, PA-C      Noralyn Pick. Eden Emms, MD, [facility]  Electronically Signed    RB/MEDQ  D:  03/08/2007  T:  03/09/2007  Job:  [id number]   cc:   Gordy Savers, MD

## 2010-11-29 NOTE — Telephone Encounter (Signed)
Faxed LOV & EKG to [facility] at [facility] ([phone]).

## 2010-11-29 NOTE — Discharge Summary (Signed)
NAMEHONOR, FAIRBANK NO.:  [id number]   MEDICAL RECORD NO.:  [id number]          PATIENT TYPE:  INP   LOCATION:  4740                         FACILITY:  [facility]   PHYSICIAN:  Rollene Rotunda, MD, FACCDATE OF BIRTH:  1926-12-01   DATE OF ADMISSION:  12/05/2007  DATE OF DISCHARGE:  12/06/2007                               DISCHARGE SUMMARY   DISCHARGING PHYSICIAN:  Rollene Rotunda, MD, [facility]   DISCHARGE DIAGNOSES:  1. Chest pain without EKG changes or elevated cardiac enzymes this      admission.  2. Hypertension, initiation of Norvasc 5 mg during this      hospitalization  3. Dyslipidemia.  4. Hypothyroidism.  5. Depression anxiety.  6. Generalized weakness.  7. Obstructive sleep apnea with continuous positive airway pressure      compliance.  8. Coronary artery disease status post recent stress Myoview, stable.  9. History of paroxysmal atrial fibrillation requiring chronic      anticoagulation therapy and amiodarone.   HOSPITAL COURSE:  A 77 year old Caucasian male with past medical history  as stated above had recent Myoview done on Dec 04, 2007, normal with an  EF of 58%, no ischemia.  On date of admission prior to cardiac rehab,  the patient complained of chest discomfort.  No associated symptoms,  lasted around 10 minutes improved with O2.  Symptoms reoccurred.  The  patient was transported to the ER or he stated he had been having chest  discomfort two to three times a week, nonexertional fleeting.  The  patient admitted for observation.  Ruled out by cardiac markers.  A 12-  lead EKG sinus brady with incomplete right bundle branch block.  No  change from previous EKGs.  The patient found to be hypertensive  188/102.  Norvasc 5 mg added to medications.  Blood pressure improved  177/96 down to 120/82 this morning after ambulating in the hall without  any exertional discomfort.  Dr. Antoine Poche came to see the patient for  approximately 20 minutes, discussed  with the patient and his son  etiology of his chest discomfort symptoms suggestive of angina, use of  nitroglycerin, reviewed with the patient and son and the patient's  coronary anatomy reviewed at length.  The patient will be discharged  home to follow up with Dr. Antoine Poche as previously arranged.  The only  change in medication will be the amlodipine 5 mg daily.  Prescription  has been given to the patient.  He is to continue his Synthroid 25 mcg  daily, Protonix 40 daily, amiodarone 200 daily, aspirin 81, warfarin as  previously prescribed, pravastatin 80, trazodone 50 at bedtime, and  clonazepam 1 mg at bedtime.  The patient may continue his previous  multivitamins as taken.   DURATION OF DISCHARGE:  Encounter over 30 minutes secondary to extended  period of time educating the patient.      Dorian Pod, ACNP      Rollene Rotunda, MD, [facility]  Electronically Signed    MB/MEDQ  D:  12/06/2007  T:  12/07/2007  Job:  [id number]

## 2010-11-29 NOTE — H&P (Signed)
[name], [name] NO.:  [id number]   MEDICAL RECORD NO.:  [id number]          PATIENT TYPE:  INP   LOCATION:  2910                         FACILITY:  [facility]   PHYSICIAN:  Gerrit Friends. Dietrich Pates, MD, FACCDATE OF BIRTH:  1926/11/22   DATE OF ADMISSION:  03/05/2007  DATE OF DISCHARGE:                              HISTORY & PHYSICAL   PRIMARY CARE PHYSICIAN:  Eleonore Chiquito, M.D.   REFERRING:  Carleene Cooper, M.D.   PRIMARY CARDIOLOGIST:  Rollene Rotunda, M.D.   HISTORY OF PRESENT ILLNESS:  A 77 year old gentleman presenting to the  emergency department with recurrent atrial fibrillation identified at  cardiac rehabilitation.  [name] has had problems with AF since 2005.  He has undergone a number of cardioversions.  A trial of amiodarone was  discontinued at the time that it was thought he might need CABG surgery.  He had been experiencing chest discomfort, which subsequently resolved.  His chest discomfort was attributed to amiodarone.  He recently suffered  a recurrence and was admitted to hospital and started on dofetilide.  His calculated creatinine clearance has been between 50 and 60,  prompting an initial dose of 250 mcg b.i.d.  He did well initially, but  noted a sharp momentary left chest pain while showering this morning,  episodes of diaphoresis over the past 2 days, and generalized weakness  and fatigue which he attributes to his atrial fibrillation.  He  presented to cardiac rehabilitation and was found to have atrial  fibrillation with a rate of approximately 120, prompting referral to the  emergency department.   Cardiovascular history dates back to at least 2002 when he underwent  stent implantation in the obtuse marginal.  A stent was placed in the  right coronary in 2006 after myocardial infarction.  He underwent  coronary angiography in April 2007 at which time he had no flow-limiting  disease.  Cardiovascular risk factors include  dyslipidemia and  hypertension.  His last echocardiogram was a transesophageal  echocardiogram associated with cardioversion earlier this month.  Ejection fraction was normal.  He is said to have significant mitral  regurgitation.   PAST MEDICAL HISTORY:  Otherwise notable for peripheral vascular disease  with a right popliteal aneurysm, BPH, GERD, Barrett's esophagus,  anxiety, and a history of Mallory-Weiss tear.   The patient has no known allergies.   Recent medications include:  1. Protonix 40 mg b.i.d.  2. Levothyroxine 0.025 mg daily.  3. Simvastatin 40 mg daily.  4. Clonazepam 1 mg q.h.s.  5. Aspirin 325 mg daily.  6. Amlodipine 5 mg daily.  7. Warfarin 5 mg daily.  8. Dofetilide 250 mcg b.i.d.   SOCIAL HISTORY:  Married and lives locally; retired; no use of alcohol  nor tobacco; two adult children.   FAMILY HISTORY:  No prominent coronary disease nor atrial fibrillation.   REVIEW OF SYSTEMS:  Notable for intermittent depression and anxiety.  He  has pronounced eructation and episodic nausea.  All other systems  reviewed and are negative.   EXAMINATION:  Pale, pleasant gentleman in no acute distress.  The  temperature is 97.1, heart rate 115 and irregular, respirations 20,  blood pressure 125/65, O2 saturation 99% on 2 L .  HEENT:  Anicteric sclerae; normal lids and conjunctivae; EOMs full;  normal oral mucosa.  NECK:  Mild jugular venous distension; normal carotid upstrokes without  bruits.  ENDOCRINE:  No thyromegaly.  HEMATOPOIETIC:  No adenopathy.  LUNGS:  Clear.  CARDIAC:  Irregular rhythm; normal first and second heart sounds; modest  systolic murmur.  ABDOMEN:  Soft and nontender; liver edge at the right costal margin; no  masses; no organomegaly; no bruits.  EXTREMITIES:  Distal pulses intact; no edema.  NEUROMUSCULAR:  Symmetric strength and tone; normal cranial nerves.  PSYCHIATRIC:  Alert and oriented; normal affect.  SKIN:  No significant  lesions.   EKG:  Atrial fibrillation with rapid ventricular rate; no prominent QTC  prolongation.   Initial laboratory notable for no acute changes on chest x-ray, normal  CBC and normal chemistry profile with a creatinine of 1.2.  INR is 2.3.  Cardiac markers are negative.   IMPRESSION:  [name] presents with recurrent atrial fibrillation on  low-dose dofetilide.  Therapeutic options include increasing the dose of  dofetilide under observation, a second trial of amiodarone, a more  intense trial of adequate control of heart rate in atrial fibrillation,  or radiofrequency ablation.  For now, we will change amlodipine to  intravenous diltiazem and adjust the dosage to achieve control of heart  rate.  Dr. Antoine Poche and Dr. Ladona Ridgel will review [name]'s situation  and determine which option for longer term control of atrial  fibrillation will be pursued.  Myocardial infarction will be ruled out.  We will verify that thyroid function is normal.      Gerrit Friends. Dietrich Pates, MD, [facility]  Electronically Signed     RMR/MEDQ  D:  03/05/2007  T:  03/05/2007  Job:  [id number]

## 2010-11-29 NOTE — Assessment & Plan Note (Signed)
[facility]ALTHCARE                            CARDIOLOGY OFFICE NOTE   [name], [name]                        MRN:          [id number]  DATE:11/27/2007                            DOB:          1927/01/12    PRIMARY:  Dr. Amador Cunas.   REASON FOR PRESENTATION:  Evaluate patient with atrial fibrillation,  mitral regurgitation, coronary disease, dyspnea and fatigue.   HISTORY OF PRESENT ILLNESS:  The patient is a 77 year old gentleman with  the above problems.  Since I last saw him, his biggest complaint has  continued to be fatigue; this is even more progressive.  He says he gets  tired just walking from the car and into the elevators here.  He says he  starts to get a little balance disorder when he gets that fatigued.  He  does describe some dyspnea with activity.  He is not having any resting  shortness of breath, PND or orthopnea.  He has not noticed any  palpitations.  He has had no syncope.  He is still working in cardiac  rehab, but he is only able to do some minimal activity.  He said his  heart rate never gets above the 60s.  I have not had any report some  cardiac rehab.  He is not describing any chest discomfort, neck or arm  discomfort.  He says he is sleeping well.  He is using his CPAP.   PAST MEDICAL HISTORY:  1. Coronary artery disease (see the March 14, 2007 note for details);      mitral regurgitation (moderate via last transthoracic      echocardiogram in December 2008), well-preserved ejection fraction      (55% to 65%), atrial fibrillation (now controlled with amiodarone).  2. Dyslipidemia.  3. Hypertension.  4. Hypothyroidism.  5. Prostatic hypertrophy.  6. Depression.  7. Renal insufficiency.  8. Sleep apnea, treated with CPAP.   ALLERGIES:  NONE.   MEDICATIONS:  1. Amiodarone 200 mg daily.  2. Protonix 40 mg b.i.d.  3. Aspirin 81 mg daily.  4. Multivitamin.  5. Coenzyme Q.  6. Coumadin.  7. Synthroid 25 mcg daily.  8. Zocor 40 mg daily.  9. Clonazepam 1 mg nightly.  10.Trazodone 50 mg nightly.  11.Doxycycline.  12.Cartia 30 mg t.i.d.   REVIEW OF SYSTEMS:  As stated in the HPI and otherwise negative for  other systems.   PHYSICAL EXAMINATION:  The patient is in no acute distress.  Blood pressure 153/94, heart rate 57 and regular, weight 203 pounds and  body mass index 28.  HEENT:  Eyelids are unremarkable; pupils are equal, round and reactive  light; fundi not visualized; oral mucosa normal.  NECK:  No jugular distention at 45 degrees; carotid upstroke brisk and  symmetrical; no bruits; no thyromegaly.  LYMPHATICS;  No cervical, axillary or inguinal adenopathy.  LUNGS:  Clear to auscultation bilaterally.  BACK:  No costovertebral angle tenderness.  CHEST:  Unremarkable.  HEART:  PMI not displaced or sustained, S1 and S2 within normal limits;  no S3, no S4, no clicks, no rubs, no  murmurs.  ABDOMEN:  Mildly obese;  positive bowel sounds, normal in frequency and pitch; no bruits, no  rebound, no guarding, no midline pulsatile mass, no hepatomegaly, no  splenomegaly.  SKIN:  No rashes, no nodules.  EXTREMITIES:  Pulse 2+ throughout.  No edema.  No cyanosis or clubbing.  NEUROLOGIC:  Oriented to person, place and time; cranial nerves II-XII  grossly intact; motor grossly intact.   EKG:  Sinus bradycardia, rate 60, leftward axis, RSR prime in V1 and V2  with an incomplete right bundle branch block, no acute ST-T wave  changes.   ASSESSMENT AND PLAN:  1. Fatigue.  The patient is continuing to have profound fatigue.  At      this point, I am going to stop his Cardizem.  He does have some      bradycardia.  He says his blood pressure bottoms out.  We will have      to deal with high blood pressures after he has been off this.  He      is getting his blood pressure measured frequently at Cardiac      Rehabilitation, so they will let me know what it is and we can make      further adjustments.  2.  Hypertension, as above.  3. Mitral regurgitation.  This was moderate.  My plan is to probably      get an echocardiogram again in December.  4. Dyspnea.  The patient does have some dyspnea.  He has previous      coronary disease.  The plan will be to screen him with an adenosine      perfusion study.  5. Atrial fibrillation.  He is maintaining sinus rhythm on the      amiodarone.  I am going to have to stop the simvastatin because of      the interaction.  I am going to put him on pravastatin 80 mg daily.      He gets close followup of his liver, thyroid, eyes and lungs.  He      is caught up on all of this surveillance while on amiodarone.  6. Depression.  This needs to be followed and treated closely by Dr.      Amador Cunas.  7. Coronary disease, as above.  8. Transient vision loss.  He has had no further episodes of this.  He      see his ophthalmologist      routinely.  The etiology is not clear, but he had carotids without      any significant plaque.  9. Followup: I will see him back in August, sooner if he has any acute      problems.     Rollene Rotunda, MD, [facility]  Electronically Signed    JH/MedQ  DD: 11/27/2007  DT: 11/27/2007  Job #: [id number]   cc:   Gordy Savers, MD

## 2010-11-29 NOTE — Assessment & Plan Note (Signed)
[facility]                            CARDIOLOGY OFFICE NOTE   [name], [name]                        MRN:          [id number]  DATE:08/08/2007                            DOB:          09-29-1926    PRIMARY:  Dr. Amador Cunas.   REASON FOR PRESENTATION:  Evaluate the patient with atrial fibrillation.   HISTORY OF PRESENT ILLNESS:  The patient is a 77 year old gentleman with  a history of atrial fibrillation.  He has converted to sinus rhythm and  seems to maintain this.  He has maintained Coumadin.  He has had no new  complaints other than episodes of lost vision.  He saw his  ophthalmologist recently, Dr. Burundi, and described this.  She found  nothing wrong on his ophthalmologic exam and did not suggest that he  needed to stop amiodarone.  He is on a therapeutic Coumadin level.  He  has had 2 of these episodes.  One of them happened when he turned on the  lights and things got gray.  It is not clear to me that this is  amaurosis fugax, although this cannot be excluded.   He has had no new palpitations, presyncope, or syncope.  He has had no  new chest pain, though he occasionally gets some fleeting chest pains.  He has had no new shortness of breath, PND, or orthopnea.  He has  continued multiple somatic complaints as described in the review of  systems.  He has not yet started back from cardiac rehab.   PAST MEDICAL HISTORY:  Coronary artery disease (see the March 14, 2007  note for details).  Mitral regurgitation (moderately severe on last TEE  while in atrial fibrillation).  Preserved ejection fraction (55-60%).  Atrial fibrillation now controlled with amiodarone.  Dyslipidemia.  Hypertension.  Hypothyroidism.  Prostatic hypertrophy.  Depression.  Renal insufficiency.  Sleep apnea on CPAP.   ALLERGIES:  None.   MEDICATIONS:  1. Amiodarone 200 mg daily.  2. Protonix 40 mg b.i.d.  3. Aspirin 81 mg daily.  4. Multivitamin.  5. Coenzyme  Q.  6. Coumadin.  7. Cartia 90 mg daily.  8. Simvastatin 40 mg daily.  9. Clonazepam.  10.Trazodone.   REVIEW OF SYSTEMS:  As stated in the HPI and otherwise negative for  other systems.   PHYSICAL EXAMINATION:  The patient is in no distress.  Blood pressure 155/85, heart rate 63 and regular, weight 203 pounds,  body mass index 27.  HEENT:  Eyelids unremarkable.  Pupils are equal, round, and reactive to  light and accommodation.  Fundi are not visualized.  Oral mucosa  unremarkable.  NECK:  No jugular venous distension at 45 degrees, carotid upstroke  brisk and symmetric, no bruits, thyromegaly.  LYMPHATICS:  No adenopathy.  LUNGS:  Clear to auscultation bilaterally.  BACK:  No costovertebral angle tenderness.  CHEST:  Unremarkable.  HEART:  PMI not displaced or sustained, S1 and S2 within normal limits,  no S3, no S4, no clicks, rubs, murmurs.  ABDOMEN:  Flat, positive bowel sounds, normal in frequency and pitch, no  bruits, rebound, guarding.  No midline pulsatile masses, hepatomegaly,  splenomegaly.  SKIN:  No rashes, no nodules.  EXTREMITIES:  With 2+ pulses throughout, no edema.   ELECTROCARDIOGRAM:  Sinus bradycardia, rate 57, leftward axis, intervals  within normal limits, RSR prime in V1 and V2.  No acute ST-T wave  changes.   ASSESSMENT AND PLAN:  1. Atrial fibrillation.  The patient is maintaining sinus rhythm.  He      is maintaining Coumadin.  He will continue the amiodarone at the      current dose.  He has had followup with thyroid, pulmonary function      test, and ophthalmologic exams as well as liver testing.  We will      make sure that this happens as indicated periodically.  2. Mitral regurgitation.  I will repeat an echocardiogram in the      future to further assess this now that he is in sinus rhythm.      Probably do this after the next visit.  3. Transient vision loss.  There is no recent carotids and we will go      ahead and check this.  He  remains on anticoagulation.  He continues      to follow up with his ophthalmologist.  He will let us know if this      happens again.  4. Hypertension.  Blood pressure is slightly elevated, but this is      unusual.  He will continue to manage this at home.  He is also      going to start back in cardiac rehab, where they can watch this.  5. Coronary artery disease.  He is having on ongoing symptoms      consistent with new angina.  He will continue with secondary risk      reduction.  He is now off the Plavix now that he is on Coumadin.      He is also on aspirin.  6. Risk reduction.  This is per Dr. Amador Cunas with a goal LDL less      than 100 and HDL greater      than 40.  7. Followup.  I will see him back in 4 months.  We will get the      carotid Dopplers.     Rollene Rotunda, MD, [facility]  Electronically Signed    JH/MedQ  DD: 08/08/2007  DT: 08/08/2007  Job #: [id number]   cc:   Gordy Savers, MD  Dr. Burundi

## 2010-11-29 NOTE — Assessment & Plan Note (Signed)
[facility] HEALTHCARE                            CARDIOLOGY OFFICE NOTE   [name], [name]                        MRN:          [id number]  DATE:02/28/2008                            DOB:          06-20-1927    PRIMARY CARE PHYSICIAN:  Gordy Savers, MD   REASON FOR PRESENTATION:  Evaluate the patient with mitral regurgitation  and atrial fibrillation.   HISTORY OF PRESENT ILLNESS:  The patient presents for followup of the  above.  He is 77 years old.  He has had no acute problems since he was  hospitalized with chest discomfort in May.  Says he is no longer getting  this discomfort.  He has had no new shortness of breath, PND, or  orthopnea.  He has had no new palpitations.  His biggest problem is  fatigue.  He gets tired walking up in incline.  He has episodes where he  has some brief waves of fatigue.  He is still participating in cardiac  rehab.  He did start an antidepressant Pristiq, but he says he is going  to come off this because it makes him feel worse.   PAST MEDICAL HISTORY:  Coronary artery disease (see the March 14, 2007,  note for details), mitral regurgitation (moderate via last transthoracic  echocardiogram in December 2008), well-preserved ejection fraction (55-  65%), atrial fibrillation (controlled on amiodarone), dyslipidemia,  hypertension, hypothyroidism, prostatic hypertrophy, depression, renal  insufficiency, sleep apnea treated CPAP.   ALLERGIES:  None.   MEDICATIONS:  1. Amiodarone 200 mg daily.  2. Protonix 40 mg b.i.d.  3. Aspirin 81 mg daily.  4. Multivitamin.  5. Coenzyme Q10.  6. Coumadin.  7. Clonazepam.  8. Trazodone 50 mg nightly.  9. Pristiq 50 mg daily.  10.Amlodipine 5 mg daily.  11.Pravastatin 80 mg daily.   REVIEW OF SYSTEMS:  As stated in the HPI and otherwise negative for  other systems.   PHYSICAL EXAMINATION:  GENERAL:  The patient is in no distress.  VITAL SIGNS:  Blood pressure 113/67,  heart rate 73 and regular, weight  195 pounds, and body mass index is 27.  NECK:  No jugular venous distention at 45 degrees, carotid upstroke  brisk and symmetrical, no bruits, and no thyromegaly.  LYMPHATICS:  No adenopathy.  LUNGS:  Clear to auscultation bilaterally.  BACK:  No costovertebral angle tenderness.  CHEST:  Normal.  HEART:  PMI not displaced or sustained, S1 and S2 within normal limits,  no S3, no S4, no clicks, no rubs, and no murmurs.  ABDOMEN:  Mildly  obese, positive bowel sounds, normal in frequency and pitch, no bruits,  no rebound, no guarding, no midline pulsatile mass, and no organomegaly  .  SKIN:  No rashes, no nodules.  EXTREMITIES:  2+ pulses, no edema.  NEURO:  Grossly intact.   EKG; sinus rhythm, rate 65, incomplete right bundle-branch block, early  transition lead V2, nonspecific anterior T-wave inversions, unchanged  from previous EKGs.   ASSESSMENT AND PLAN:  1. Mitral regurgitation.  We will get an echocardiogram in December,  which will be 1 year from the time of his last.  He is having no      new symptoms to suggest acute worsening.  2. Atrial fibrillation.  He is on amiodarone.  We will get pulmonary      function testing again in December, which will be a year since the      most recent.  He has had TSH and liver profile checked and they are      normal.  3. Hypothyroidism as above.  4. Depression.  I have encouraged him to work with Dr. Amador Cunas for      continued management of the above.  5. Hypertension.  This is well controlled.  6. Followup.  I will see him back in December or sooner if needed.     Rollene Rotunda, MD, [facility]  Electronically Signed    JH/MedQ  DD: 02/28/2008  DT: 02/28/2008  Job #: [id number]   cc:   Gordy Savers, MD

## 2010-11-29 NOTE — Assessment & Plan Note (Signed)
[facility] HEALTHCARE                            CARDIOLOGY OFFICE NOTE   MITUL, HALLOWELL                        MRN:          [id number]  DATE:03/22/2007                            DOB:          [birth year]    [name] is a 77 year old patient of Dr. Leta Jungling Hochrein's. He presented  to [facility] today for a transesophageal echocardiogram guided  cardioversion. Upon arrival he was complaining of significant fatigue.  However we did performed an electrocardiogram and the patient was in  sinus rhythm. Note he had recently been placed on amiodarone. Also note  that his INR yesterday apparently was 1.4 per Shelby Dubin at our  [facility]. She gave the patient Lovenox which he has been taking  and she also stated that would supply him with enough Lovenox through  the weekend until his INR can be checked on Monday. The patient was  complaining of occasional numbness in his left upper extremity  predominantly after he sleeps. He also stated that he had transient  numbness in his left lower extremity times 10 minutes previously. We  therefore ordered a non contrast head CT to rule out bleed. If it is  unremarkable then I think it is safe for him to be discharged. Again he  will be on Lovenox through the weekend until his INR is checked on  Monday. There were no focal findings on his neurological examination and  it was not clear to me that all of his symptoms were neurologically  mediated. He will follow up with Dr. Antoine Poche as scheduled.     [name] [name], MD, [facility]  Electronically Signed    BSC/MedQ  DD: 03/22/2007  DT: 03/22/2007  Job #: [id number]

## 2010-11-29 NOTE — Assessment & Plan Note (Signed)
[facility]CARE                            CARDIOLOGY OFFICE NOTE   MALCOLM, QUAST                        MRN:          [id number]  DATE:05/06/2007                            DOB:          11-24-1926    PRIMARY CARE PHYSICIAN:  Dr. Amador Cunas.   REASON FOR PRESENTATION:  Evaluate patient with atrial fibrillation.   HISTORY OF PRESENT ILLNESS:  Patient is a 77 year old gentleman who  presents for evaluation of atrial fibrillation.  He has had consistent  problems with this.  Most recently we started him on amiodarone.  The  plan was then to bring him back for TEE guided cardioversion.  However,  he developed sinus rhythm probably the day before the day of the  cardioversion.  He did not require this then and did not have the TEE.  He says he has not noticed the palpitations and would not have known he  was in regular rhythm except that he is less fatigued.  He has been slow  to bounce back this time though he has more energy than before.  He is  still fatigued after doing a short amount of work for a few hours, he  has to rest.  He is not having any PND or orthopnea.  He does not have  any presyncope or syncope.  He is not having any chest discomfort, neck  or arm discomfort.   PAST MEDICAL HISTORY:  1. Coronary artery disease (see the March 14, 2007 note for details).  2. Mitral regurgitation (moderately severe on the last TEE while in      atrial fibrillation).  3. Preserved ejection fraction (55% - 60%).  4. Atrial fibrillation.  5. Dyslipidemia.  6. Hypertension.  7. Hypothyroidism.  8. Prostatic hypertrophy.  9. Depression.  10.Renal insufficiency.  11.Sleep apnea on CPAP.   ALLERGIES:  NONE.   MEDICATIONS:  1. Protonix 40 mg b.i.d.  2. Synthroid 25 mcg daily.  3. Zocor 40 mg daily.  4. Clonazepam 1 mg daily.  5. Trazodone 50 mg daily.  6. Multivitamin.  7. Coenzyme Q.  8. Coumadin.  9. Aspirin 81 mg daily.  10.Cardizem CD  180 mg daily.  11.Amiodarone 200 mg b.i.d.   REVIEW OF SYSTEMS:  As stated in the HPI and otherwise negative for  other systems.   PHYSICAL EXAMINATION:  The patient is in no distress.  He seems to be  slightly brighter than previous.  Blood pressure 140/88, heart rate 60  and regular, weight 201 pounds, body mass index 28.  HEENT:  Eyelids unremarkable, pupils equally round and reactive to  light, fundi not visualized, oral mucosa unremarkable.  NECK:  No jugular venous distention at 45 degrees, carotid upstroke  brisk and symmetric, no bruits, no thyromegaly.  LYMPHATICS:  No cervical, axillary, inguinal adenopathy.  LUNGS:  Clear to auscultation bilaterally.  BACK:  No costovertebral angle tenderness.  CHEST:  Unremarkable.  HEART:  PMI not displaced or sustained, S1 and S2 within normal limits,  no S3, no S4, no clicks, no rubs, no murmurs.  ABDOMEN:  Mildly obese,  positive bowel sounds normal in frequency and  pitch, no bruits, no rebound, no guarding, no midline pulsatile mass, no  hepatomegaly, splenomegaly.  SKIN:  No rashes, no nodules.  EXTREMITIES:  With 2+ pulses throughout, no edema, no cyanosis, no  clubbing.  NEURO:  Oriented to person, place, and time; cranial nerves II-XII  grossly intact, motor grossly intact.   EKG sinus bradycardia, rate 56, left axis deviation, RSR prime V1 and  V2, nonspecific T wave changes.   ASSESSMENT/PLAN:  1. Atrial fibrillation.  The patient is back in sinus rhythm.  At this      point I am going to reduce his amiodarone to 200 mg a day.  I am      going to cut back on his Cardizem with his bradycardia and      continued fatigue.  He will continue the other medications as      listed.  I will get pulmonary function tests as a baseline on his      amiodarone.  He understands he needs to see an eye doctor at least      once a year.  Will check thyroid and liver functions routinely.  He      understands the neurologic and skin  involvement with amiodarone.      He will continue with Coumadin.  2. Mitral regurgitation.  He has had mild mitral regurgitation this      year while in sinus rhythm and moderately severe while in atrial      fibrillation.  My plan is in 2 months to get another echocardiogram      and assess.  If by transthoracic he has moderate or more mitral      regurgitation then he will need a transesophageal echocardiogram      and a clear assessment of his ejection fraction.  At this point I      think he is asymptomatic with the regurgitation so the decision      dyspnea on exertion surgery will be based on severity and left      ventricular function.  3. Fatigue.  I think this is multifactorial and related to his      medications, depression, sleep apnea.  He will continue to have      this followed by his primary care doctor.  4. Depression per Dr. Amador Cunas.  5. Followup.  I will see him back in 2 months, sooner if needed.     Rollene Rotunda, MD, [facility]  Electronically Signed    JH/MedQ  DD: 05/06/2007  DT: 05/06/2007  Job #: [id number]   cc:   Gordy Savers, MD

## 2010-11-29 NOTE — Assessment & Plan Note (Signed)
[facility]                            CARDIOLOGY OFFICE NOTE   [name], [name]                        MRN:          [id number]  DATE:03/14/2007                            DOB:          July 11, 1927    PRIMARY CARE PHYSICIAN:  Gordy Savers, M.D.   REASON FOR PRESENTATION:  Evaluate patient with persistent atrial  fibrillation.   HISTORY OF PRESENT ILLNESS:  Patient was admitted again to the hospital  on August 19 with recurrent atrial fibrillation.  This was despite  Tikosyn therapy.  He was taken off of the Tikosyn, as this had now  failed, and he could not tolerate a higher dose because of renal  insufficiency.  We did discuss further options to include AV node  ablation and pacemaker placement, atrial fibrillation, and treatment  with amiodarone.  He had a vague response to amiodarone in the past with  some weakness.  However, there was no true intolerance or allergy  documented.  He preferred to restart the amiodarone.  He was started on  this in the hospital.  He remained on therapeutic Coumadin.  He was  discharged with rate-controlled atrial fibrillation with a plan to  eventually cardiovert after he had a therapeutic level of his  amiodarone.   He had his Coumadin checked, and unfortunately he had a level of 1.7  recently.  He has been profoundly weak and depressed.  He has been  having episodes of breathlessness to go away quickly.  He has not been  having any PND or orthopnea.  He has not been having any chest  discomfort, neck or arm discomfort.  He does notice palpitations.   PAST MEDICAL HISTORY:  1. Coronary artery disease (catheterization in April, 2007 with an LAD      40% tubular lesion at the ostium, mid 50-60% stenosis, diagonal 70%      ostial stenosis, 30% stenosis at the ostium, and 40% first obtuse      marginal stenosis, right coronary artery dominant.  He had a stent      with 30% stenosis in the mid right  coronary artery.  The distal      stent in the right coronary artery had 50-60% stenosis.  The PDA      had 40% stenosis.  2. Mitral regurgitation.  It has been mild to moderate in the past.      (A TEE recently suggested that it was increased in severity,      although this is during atrial fibrillation when he has been noted      to have more significant MR).  3. Persistent atrial fibrillation.  4. Dyslipidemia.  5. Hypertension.  6. Hypothyroidism.  7. Prostatic hypertrophy.  8. Depression.  9. Renal insufficiency.  10.Sleep apnea, on CPAP.   ALLERGIES:  None.   MEDICATIONS:  1. Protonix 40 mg b.i.d.  2. Synthroid 25 mg daily.  3. Zocor 40 mg daily.  4. Clonazepam.  5. Trazodone 50 mg daily.  6. Multivitamins.  7. Coenzyme Q.  8. Coumadin.  9. Aspirin 81 mg daily.  10.Cardizem CD 180 mg daily.  11.Amiodarone 400 mg b.i.d.   REVIEW OF SYSTEMS:  As stated in the HPI, otherwise negative for other  systems.   PHYSICAL EXAMINATION:  Patient is in no distress.  He does have a very  flat affect.  Blood pressure 129/87, heart rate 102 and irregular.  Weight 195 pounds.  HEENT:  Eyelids unremarkable.  Pupils are equal, round and reactive to  light.  Fundi not visualized.  Oral mucosa unremarkable.  NECK:  No jugular venous distention at 45 degrees.  Carotid upstroke  brisk and symmetric.  No bruits, no thyromegaly.  LYMPHATICS:  No adenopathy.  LUNGS:  Clear to auscultation bilaterally.  BACK:  No costovertebral angle tenderness.  CHEST:  Unremarkable.  HEART:  PMI not displaced or sustained.  S1 and S2 within normal limits.  No S3, no S4, a 2/6 peaking brief apical systolic murmur without  radiation.  No holosystolic murmur.  No diastolic murmur.  ABDOMEN:  Flat, positive bowel sounds.  Normal in frequency and pitch.  No bruits, rebound, guarding.  There are no midline pulsatile masses.  No hepatomegaly, no splenomegaly.  SKIN:  No rashes, no nodules.  EXTREMITIES:   Pulses 2+.  No edema.   EKG:  Atrial fibrillation with a ventricular rate of about 90s.  No  acute ST-T wave changes.   IMPRESSION:  1. Atrial fibrillation:  Patient has persistent atrial fibrillation.      He particularly feels poorly in this rhythm.  He has some      breathlessness but in the office did not have any evidence of      volume overload.  A pulse oximetry was in the mid to high 90s at      rest and with ambulation.  His weights are stable.  He is not      having any symptoms consistent with angina.  I think he does feel      his atrial fibrillation and does not feel as well with this.  I      also think there is an element of depression, as he admits.  The      family is quite anxious about the decline he has had while in      atrial fibrillation, and they are anxious to get him cardioverted.      Unfortunately, with his INR of 1.7, he will need a TEE-guided      cardioversion.  He did not convert previously on the amiodarone or      Tikosyn without electrical cardioversion, so I think it is safe to      continue this.  I will load him on amiodarone and plan for a TEE-      guided cardioversion by Dr. [name] in eight days.  If the patient      begins to feel better in the meantime, we may defer this.  If I      wait four weeks, he will have higher dose of amiodarone and      therapeutic Coumadin, which means he will not need a TEE.  The      family understands this and will let me know if he is feeling much      better next week, and we can hold off on the cardioversion.  He      will remain on the other medications as listed.  2. Mitral regurgitation:  We plan on getting him back on sinus rhythm.  Seeing if he can maintain this and then reassessing the MR going      forward.  It is always reverted to a mild MR when he has been in      sinus rhythm.  3. Coronary artery disease:  He is not having any symptoms consistent      with angina.  No further cardiovascular  testing is suggested.  He      will continue a secondary risk reduction.  4. Depression:  I do think this is a profound component of his      complaints, and I will refer him back to Dr. Amador Cunas for this.  5. Chronic anticoagulation:  I will have him seen in the Coumadin      clinic the day before his cardioversion to make sure he has an INR      of at least 2.     Rollene Rotunda, MD, [facility]  Electronically Signed    JH/MedQ  DD: 03/15/2007  DT: 03/16/2007  Job #: [id number]   cc:   Gordy Savers, MD

## 2010-11-29 NOTE — Assessment & Plan Note (Signed)
[place] [facility]                             PULMONARY OFFICE NOTE   HRITHIK, BOSCHEE                        MRN:          [id number]  DATE:05/09/2007                            DOB:          05-11-1927    I saw Mr. Nickey today in followup for his obstructive sleep apnea,  sleep onset insomnia, and abnormal behavior while asleep.   He has had a fairly difficult course over the last several months  related to his atrial fibrillation.  He says that as a result of this he  has been taking naps several times during the day, at least 2 to 3 times  a week.  However, he is not using his servo-ventilator when he is  sleeping during the day.  He will usually go to bed at around 11 o'clock  at night.  He takes his trazodone and Klonopin at around 8:30 or 9.  He  says he wakes at about 3 or 4 o'clock in the morning, and then it can  take him up to an hour to fall back to sleep.  He says he is also not  using his server ventilator when he is trying to fall back to sleep  because he feels like the mask is causing too much pressure around his  face, and he sometimes gets air leaking around that.  What I have  suggested for Mr. Coller to do is to try to take his trazodone closer to  his bedtime.  I have also advised him that he could try using 100 mg  instead of 50 mg of trazodone to see if this improves his symptoms of  insomnia.  I have also advised him that he should use his servo-  ventilator for the entire time he is asleep, including if he is taking  naps during the day.  I have also asked him to try and consolidate his  sleep time to 1 period during the night.  He is to continue on Klonopin  at 1 mg nightly.  I have advised him that if he is still having trouble  with his trazodone and insomnia, that he could try adjusting the timing  of his Klonopin dose as well.   I will follow up with him in approximately 3 to 4 months.     Coralyn Helling, MD  Electronically Signed    VS/MedQ  DD: 05/09/2007  DT: 05/10/2007  Job #: [id number]

## 2010-11-29 NOTE — Op Note (Signed)
NAMEJEDD, SCHULENBURG NO.:  [id number]   MEDICAL RECORD NO.:  [id number]          PATIENT TYPE:  INP   LOCATION:  2014                         FACILITY:  [facility]   PHYSICIAN:  Doylene Canning. Ladona Ridgel, MD    DATE OF BIRTH:  1927/06/29   DATE OF PROCEDURE:  02/18/2007  DATE OF DISCHARGE:                               OPERATIVE REPORT   PROCEDURE PERFORMED:  DC cardioversion.   INDICATIONS:  Symptomatic atrial fibrillation.   BRIEF HISTORY:  The patient is a 77 year old man with a history of  ischemic heart disease and mitral regurgitation, who has developed  atrial fibrillation and severe symptoms.  He was initially on Amiodarone  but developed chest pain on this drug and had to have it stopped.  He  had recurrent atrial fibrillation and underwent cardioversion, which  lasted for only a week or two.  He has now been admitted to the hospital  for Tikosyn loading.  He had not been therapeutically anticoagulated and  is now referred for DC cardioversion.   PROCEDURE:  After informed consent was obtained, the patient was sedated  in the usual manner with fentanyl, Versed, and valium.  He was  successfully cardioverted with 200 joules of synchronized biphasic  energy, delivered through the electrodispersive pad, restoring sinus  rhythm.  The patient tolerated the procedure well.   COMPLICATIONS:  There were no immediate complications.   RESULTS:  Successful DC cardioversion of patient with symptomatic atrial  fibrillation, restoring sinus rhythm.      Doylene Canning. Ladona Ridgel, MD  Electronically Signed     GWT/MEDQ  D:  02/18/2007  T:  02/19/2007  Job:  [id number]   cc:   Gordy Savers, MD  Maryruth Eve

## 2010-11-29 NOTE — Assessment & Plan Note (Signed)
[facility]                            CARDIOLOGY OFFICE NOTE   [name], [name]                        MRN:          [id number]  DATE:06/17/2008                            DOB:          1926-12-26    PRIMARY CARE PHYSICIAN:  Gordy Savers, MD   REASON FOR PRESENTATION:  Evaluate the patient with mitral regurgitation  and atrial fibrillation.   HISTORY OF PRESENT ILLNESS:  The patient returns for followup.  Since I  last saw him, he was seen one time in the office when he had some  weakness and a low heart rate.  He subsequently had a stress perfusion  study which demonstrated no evidence of ischemia.  His ejection fraction  was 57%.  Since that time, he has continued to have his usual tiredness.  However, he is participating in cardiac rehab.  He has had no new  complaints.  He has had no presyncope or syncope.  He has had no  palpitations.  He has had no chest pressure.  He has no new dyspnea.  He  did have an echocardiogram today.  This demonstrates that his ejection  fraction is 60%.  The mitral regurgitation which we have been following  his mild to moderate.  There are significant other abnormalities.   PAST MEDICAL HISTORY:  1. Coronary artery disease (see March 14, 2007, note for details).  2. Mitral regurgitation (moderate as described).  3. Well-preserved ejection fraction.  4. Atrial fibrillation (controlled on amiodarone).  5. Dyslipidemia.  6. Hypertension.  7. Hypothyroidism.  8. Prostatic hypertrophy.  9. Depression.  10.Renal insufficiency.  11.Sleep apnea treated with CPAP.   ALLERGIES:  None.   MEDICATIONS:  1. Trazodone 25 mg nightly.  2. Protonix 40 mg daily.  3. Pravastatin 80 mg daily.  4. Amlodipine 5 mg daily.  5. Clonazepam 1 mg nightly.  6. Synthroid 25 mcg daily.  7. Coumadin.  8. Coenzyme Q.  9. Multivitamin.  10.Aspirin 81 mg daily.  11.Amiodarone 200 mg daily.   REVIEW OF SYSTEMS:  As stated  in the HPI and otherwise negative for  other systems.   PHYSICAL EXAMINATION:  GENERAL:  The patient is in no distress.  VITAL SIGNS:  Blood pressure 144/77, heart rate 65 and regular, weight  203 pounds, body mass index 28.  HEENT:  Eyes unremarkable, pupils equal, round, and reactive to light,  fundi not visualized, oral mucosa unremarkable .  NECK:  No jugular venous distention at 45 degrees, carotid upstroke  brisk and symmetric, no bruits, no thyromegaly.  LYMPHATICS:  No adenopathy.  LUNGS:  Clear to auscultation bilaterally.  BACK:  No costovertebral angle tenderness.  CHEST:  Unremarkable.  HEART:  PMI not displaced or sustained, S1 and S2 within normal limits,  no S3, no S4, no clicks, no rubs, no murmurs.  ABDOMEN:  Mildly obese, positive bowel sounds normal in frequency and  pitch, no bruits, no rebound, no guarding, no midline pulsatile mass, no  organomegaly.  SKIN:  No rashes, no nodules.  EXTREMITIES:  Pulse 2+, no edema.  NEUROLOGIC:  Grossly intact.   ASSESSMENT AND PLAN:  1. Mitral regurgitation.  There is no evidence that the patient's      mitral regurgitation has worsened.  I think it does get worse when      he is in atrial fibrillation, but now maintaining sinus rhythm, it      is moderate at the most.  No further evaluation is necessary.  2. Atrial fibrillation.  He is maintaining sinus rhythm.  He will      continue on the amiodarone.  He is up-to-date on his labs and had a      pulmonary function test earlier this year.  We will keep      surveillance for this high-risk med.  He will remain on Coumadin.  3. Bradycardias.  This has been a problem in the past, but he has not      had any presyncope or syncope.  No further evaluation is warranted.  4. Coronary artery disease.  He will continue with risk reduction.  5. Dyslipidemia.  Per Dr. Amador Cunas with a goal LDL less than 100      and HDL greater than 40.  6. Hypertension.  This is currently not an  issue though slightly      elevated today, it typically is not.  He has to watch cardiac rehab      and there has been no trend upward.  7. Followup.  I will see the patient again in 4 months or sooner if      needed.     Rollene Rotunda, MD, [facility]  Electronically Signed    JH/MedQ  DD: 06/17/2008  DT: 06/18/2008  Job #: [id number]   cc:   Gordy Savers, MD

## 2010-11-29 NOTE — Assessment & Plan Note (Signed)
[facility]                            CARDIOLOGY OFFICE NOTE   ANQUAN, AZZARELLO                        MRN:          [id number]  DATE:01/28/2007                            DOB:          [birth year]    PRIMARY CARDIOLOGIST:  Rollene Rotunda, M.D.   PRIMARY CARE PHYSICIAN:  Gordy Savers, M.D.   SUMMARY OF HISTORY:  Mr. [name] is a 77 year old white male who was  recently hospitalized from June 3 to unknown date with recurrent atrial  fibrillation noted during rehab, post TEE cardioversion restoring him to  a normal sinus rhythm.   Since his discharge Mr. Neis states that most of the time he feels  fine, but he has noticed a chronic weakness.  This is not associated  with any particular activity or time of day, he just always feels weak.  He has not resumed cardiac rehab and has avoided projects or  participation in some of the things that he might normally do because of  this generalized weakness.  He has not had any syncope, chest  discomfort, palpitations, or visual disturbances.   PAST MEDICAL HISTORY:  Notable for no known drug allergies.   MEDICATIONS:  1. Protonix 40 mg b.i.d.  2. His felodipine was changed to Norvasc 5 mg by the V.A.  3. Synthroid 25 mcg daily.  4. Zocor 40 mg daily.  5. Clonazepam 1 mg daily.  6. Trazodone 50 mg daily.  7. Aspirin 325 mg daily.  8. Multivitamin two daily.  9. CoQ10 two daily.  10.Toprol-XL 75 mg daily.  11.Coumadin as directed.   (It is noted that at time of discharge his metoprolol 25 mg one-half a  tablet b.i.d. was increased to 75 mg daily.)   PAST MEDICAL HISTORY:  Notable for:  1. Atrial fibrillation.  2. Coronary artery disease with prior MIs and several stents.  3. Hypertension.  4. Hypothyroidism.  5. GERD with Barrett's esophagus.  6. Anxiety.  7. History of Mallory-Weiss tear.  8. BPH.  9. Peripheral artery disease with known right popliteal aneurysm.  10.Hypertension.  11.Continued fatigue.  12.Obstructive sleep apnea with a nocturnal polysomnogram on Nov 21, 2006, by Dr. Craige Cotta, for which he uses a CPAP.   EKG:  Sinus bradycardia, left axis deviation, incomplete right bundle-  branch block, nonspecific changes, no acute changes from prior EKGs.   PHYSICAL EXAMINATION:  GENERAL:  Well-nourished, well-developed,  pleasant white male in no apparent distress, accompanied by his wife.  Blood pressure 110/68, pulse 55 and regular, weight 196.04.  HEENT:  Unremarkable.  NECK:  Supple without thyromegaly, adenopathy, JVD, or carotid bruits.  CHEST:  Symmetrical excursion, clear to auscultation.  HEART:  PMI is not displaced.  Regular rate and rhythm.  ABDOMEN:  Unremarkable.  EXTREMITIES:  Negative cyanosis, clubbing or edema.  Peripheral pulses  are symmetrical and intact.   IMPRESSION:  1. Fatigue of uncertain etiology.  2. Recent cardioversion restoring normal sinus rhythm.  3. History as noted above.   DISPOSITION:  On review of Mr. Casaus's symptoms and medications,  it is  unclear specifically why he should continue to remain weak.  However, he  has had a significant increase in his beta blocker recently.  I  suggested that he take his beta blocker in the evening to see if this  helps with his daytime fatigue.  I have also asked him to begin a blood  pressure and pulse diary, as that if his blood pressure or pulse are  significantly lower on this higher dose of beta blocker that this may  need to be decreased.  If this measure above helps with his fatigue, I  have given him permission to resume his cardiac rehab a week from  Tuesday.  I have asked him to return to the office to visit with the PA  in approximately 2 weeks to review his blood pressure and pulse diary  and his symptoms.   On review of his chart as well, it is also unclear the last time he had  a TSH checked.  This should be considered if his symptoms have not  improved at the  next office visit.      Joellyn Rued, PA-C  Electronically Signed      Noralyn Pick. Eden Emms, MD, [facility]  Electronically Signed   EW/MedQ  DD: 01/28/2007  DT: 01/29/2007  Job #: [id number]   cc:   Gordy Savers, MD  Rollene Rotunda, MD, [facility]

## 2010-11-29 NOTE — H&P (Signed)
[name], [name] NO.:  [id number]   MEDICAL RECORD NO.:  [id number]          PATIENT TYPE:  INP   LOCATION:  2014                         FACILITY:  [facility]   PHYSICIAN:  Luis Abed, MD, FACCDATE OF BIRTH:  Dec 27, 1926   DATE OF ADMISSION:  02/13/2007  DATE OF DISCHARGE:                              HISTORY & PHYSICAL   CARDIOLOGIST:  Dr. Rollene Rotunda.   PRIMARY CARE PHYSICIAN:  Dr. Gordy Savers, MD   CHIEF COMPLAINT:  Fatigue, palpitations and chest pain.   HISTORY OF PRESENT ILLNESS:  [name] is a 77 year old male patient  with a history of coronary disease as outlined below as well as a  history of paroxysmal atrial fibrillation.  He recently was admitted to  [facility] with recurrent atrial fibrillation.  This was in  June 2008.  He subsequently underwent transesophageal echocardiogram  guided cardioversion.  He was maintaining sinus rhythm when he followed  up in the office on July 14.  He was complaining of some fatigue and his  timing of Toprol was change around.  He then presented back to the  office last week for Coumadin clinic follow-up.  At that time, he noted  worsening fatigue and he was found to be back in atrial fibrillation.  His Toprol was adjusted for better rate control.  He returned today for  follow-up.  He notes that he continues to feel quite weak.  He notes a  great amount of shortness of breath as well as chest pain.  His chest  pain comes and goes.  It is a substernal discomfort.  He denied any  syncope but does feel lightheaded.  He has had some symptoms of  orthopnea, but no true PND or pedal edema.  He notes his chest  discomfort is similar to his previous angina, but not as bad.  He is not  having rest pain at this point in time.  He denies any exertional chest  discomfort.   PAST MEDICAL HISTORY:  As outlined below in problem list.   CURRENT MEDICATIONS:  1. Protonix 40 mg twice a day.  2.  Synthroid 25 mcg a day.  3. Zocor 40 mg a day.  4. Clonazepam 1 mg a day.  5. Trazodone 50 mg daily.  6. Aspirin 325 mg daily.  7. Multivitamin daily.  8. Co-enzyme Q-10.  9. Amlodipine 10 mg half tablet daily.  10.Coumadin as directed.  11.Toprol XL 25 mg 2 tablets b.i.d.  12.Nitroglycerin p.r.n. chest pain.   ALLERGIES:  No known drug allergies.   SOCIAL HISTORY:  He denies any tobacco abuse.   FAMILY HISTORY:  Significant for coronary artery disease.   REVIEW OF SYSTEMS:  Please see HPI.  Denies any fevers, chills, cough,  melena, hematochezia, dysuria.  Rest of the review of systems are  negative.   PHYSICAL EXAMINATION:  GENERAL:  He is a well-nourished, well-nourished  man.  VITAL SIGNS:  His blood pressure is 117/79, pulse 92.  HEENT:  Normal.  NECK:  Without JVD.  CARDIAC:  Irregular irregular rhythm with  a 2/6 systolic ejection murmur  heard best at the left sternal border and apex.  LUNGS: Clear to auscultation bilaterally without wheezes, rales or  rhonchi.  ABDOMEN:  Soft, nontender with no active bowel sounds.  No organomegaly.  EXTREMITIES:  Without edema.  Calves nontender.  SKIN:  Warm, dry.  NEUROLOGIC:  He is alert and oriented x3.  Cranial nerves II-XII grossly  intact.  Carotids without bruits bilaterally.   STUDIES:  Electrocardiogram reveals atrial fibrillation, heart rate of  91, no ischemic changes.   IMPRESSION:  1. Recurrent persistent atrial fibrillation with controlled      ventricular rate - symptomatic  2. Chest pain - question secondary to #1.  3. Coronary artery disease.      a.     Status post stenting to the obtuse marginal 2002.      b.     Status post inferior wall myocardial infarction 2006.  A       bare metal stent to the RCA.      c.     Catheterization April 2007:  LAD 40% ostial, diagonal 70%       ostial, circumflex 30% ostial, OM-1 40% RCA 30% in-stent       restenosis and 50-60% in stent restenosis.  RCA distal 30%, PDA        40%, posterolateral 30% - CVTS was consulted at that time, but       surgery was not felt to be indicated.      d.     Myoview August 23, 2006:  No ischemia.  4. Good LV function.  5. Moderate mitral agitation by transesophageal echocardiogram January 16, 2007, beginning (effective orifice of mitral regurgitation by PISA      0.16 cm2, volume 23 mL).  6. Coumadin therapy.  7. History of intolerance to amiodarone in the past secondary to      fatigue.  8. Hypertension.  9. Hyperlipidemia.  10.History of upper airway resistance syndrome followed by Dr. Craige Cotta.  11.Treated hypothyroidism.  12.History renal insufficiency.  Creatinine 1.16 January 16, 2007.  13.BPH.  14.GERD.  15.History of Mallory-Weiss tear and Barrett's esophagus in the past.   PLAN:  The patient was also interviewed and examined by Dr. Myrtis Ser.  We  plan to admit the patient to [facility] for further evaluation  and treatment.  Will continue his same medications as listed above.  If  his INR is less than 2, he will be placed on heparin.  Will check serial  cardiac markers.  We will have the EP service see him tomorrow to make  further recommendations regarding treatment of his atrial fibrillation.      Tereso Newcomer, PA-C      Luis Abed, MD, [facility]  Electronically Signed    SW/MEDQ  D:  02/13/2007  T:  02/14/2007  Job:  [id number]

## 2010-12-02 NOTE — Procedures (Signed)
[place]. [facility]  Patient:    [name], [name] Visit Number: [id number] MRN: [id number]          Service Type: END Location: ENDO Attending Physician:  Rich Brave Dictated by:   Florencia Reasons, M.D. Proc. Date: 05/31/01 Admit Date:  05/31/2001   CC:         Gordy Savers, M.D. [facility]   Procedure Report  PROCEDURE:  Upper endoscopy with biopsy.  INDICATION:  This is a 77 year old gentleman with long-standing heartburn symptoms, now well-controlled by the use of Prilosec, and also occasional dysphagia symptoms, who had hematemesis following a PTCA while in the hospital a few months ago.  FINDINGS:  Short-segment Barretts esophagus.  DESCRIPTION OF PROCEDURE:  The nature, purpose, and risks of the procedure had been discussed with the patient, who provided written consent.  Sedation was fentanyl 100 mcg and Versed 8 mg IV without arrhythmias or desaturation.  The Olympus small-caliber adult video endoscope was passed under direct vision.  A brief glimpse of the larynx and vocal cords was unremarkable.  The esophagus was entered.  There was no active esophagitis or free reflux occurring during this exam, but the distal several centimeters of the esophagus had tongues of Barretts mucosa extending a maximum of about 2-3 cm up the esophagus.  There was a 2 cm hiatal hernia below this area.  Several biopsies were obtained from the Barretts tissue prior to removal of the scope.  No masses, varices, infection, or any definite stricture was identified, but there did appear to be a very fine esophageal mucosal ring at the squamocolumnar junction, at least semicircumferentially.  The stomach contained no significant residual and had normal mucosa without evidence of gastritis, erosions, ulcers, polyps, or masses, and the retroflex view of the proximal stomach actually showed no evidence of hiatal hernia. The pylorus, duodenal bulb, and  second duodenum looked normal.  Biopsies were obtained from the Barretts segment prior to removal of the scope.  The patient tolerated the procedure well, and there were no apparent complications.  IMPRESSION:  Small hiatal hernia with short-segment Barretts esophagus.  PLAN: 1. Continue PPI therapy. 2. Will await pathology on the biopsies. Dictated by:   Florencia Reasons, M.D. Attending Physician:  Rich Brave DD:  05/31/01 TD:  05/31/01 Job: [id number]

## 2010-12-02 NOTE — Assessment & Plan Note (Signed)
[facility]                             PULMONARY OFFICE NOTE   CUTLER, SUNDAY                        MRN:          [id number]  DATE:10/26/2006                            DOB:          1927/01/13    I saw [name] today in followup after he had undergone his repeat  overnight polysomnogram.   This was done on September 27, 2006.  This showed an elevated hypopnea index  of 8 with an oxygen saturation of 92%.  He did have a significant amount  of central apneic events with sleep onset, which appeared to cause him  quite a bit of difficulty with sleep initiation and sleep maintenance,  and this would correlate with [name]'s main complaint of insomnia,  which is contributing to his symptoms of excessive daytime sleepiness.   I will schedule him for a repeat sleep study, this time attempting to  either titrate him with oxygen, or with an adaptive servo-ventilator to  determine if this can help improve his ability to initiate and maintain  sleep.  After I have a chance to review his sleep study, I will discuss  this issue further with him.  In the meantime, I have refilled his  prescription for his Klonopin, and I will have him continue on trazodone  as well.     Coralyn Helling, MD  Electronically Signed    VS/MedQ  DD: 10/26/2006  DT: 10/26/2006  Job #: [id number]   cc:   Gordy Savers, MD  Rollene Rotunda, MD, [facility]

## 2010-12-02 NOTE — Procedures (Signed)
NAME:  [name], [name] NO.:  [id number]   MEDICAL RECORD NO.:  [id number]          PATIENT TYPE:  OUT   LOCATION:  SLEEP CENTER                 FACILITY:  [facility]   PHYSICIAN:  Coralyn Helling, MD        DATE OF BIRTH:  April 26, 1927   DATE OF STUDY:  11/21/2006                            NOCTURNAL POLYSOMNOGRAM   REFERRING PHYSICIAN:  Coralyn Helling, MD   REFERRING PHYSICIAN:  Coralyn Helling, M.D.   INDICATION FOR STUDY:  This is an individual who has central sleep apnea  with insomnia and excessive daytime sleepiness in addition to having REM  sleep behavior disorder.  He is referred to the sleep lab for a  titration study using adaptive Servo-Ventilator.   EPWORTH SLEEPINESS SCORE:  17.   MEDICATIONS:  Metoprolol, Protonix, felodipine, Synthroid, Zocor,  aspirin, vitamins, Coenzyme Q10, Omega fish oil, clonazepam, and  trazodone.  The patient took clonazepam and trazodone the night of his  study.   SLEEP ARCHITECTURE:  Total recording time was 403 minutes.  Total sleep  time was 285 minutes.  Sleep efficiency was 71%, which is slightly  reduced.  Sleep latency was 8.5 minutes, which is reduced.  REM latency  was 91.5 minutes.  The study was notable for the lack of slow wave  sleep.  The patient slept in both the supine and nonsupine positions.   RESPIRATORY DATA:  The average respiratory rate was 16.  The patient was  titrated from an EEP pressure of 5 to an EEP pressure of 7 cm H2O with a  minimal pressure of 4 and a maximal pressure of 10 with a set  respiratory rate of 12.  At this final pressure setting, he appeared to  have stabilization of his respiratory pattern without significant  episodes of central apneas.  It appeared that his sleep architecture had  improved considerably as well.   OXYGEN DATA:  His baseline oxygenation was 96%.  At his final pressure  setting on the adaptive Servo-Ventilator, his mean oxygenation during  non-REM sleep is 95%, is mean  oxygenation during REM-sleep was 95%,  minimal oxygenation during non-REM sleep is 91%, and his minimal  oxygenation during REM sleep was 90%.  His oxygen saturation nadir for  the test was 85%.   CARDIAC DATA:  His average heart rate was 60, and the rhythm strip  showed a normal sinus rhythm with PVCs.   MOVEMENT-PARASOMNIA:  The periodic limb movement index was 0.  The  patient had two bathroom trips, but no other abnormal behaviors were  noted.   IMPRESSIONS-RECOMMENDATIONS:  This was a titration study done using an  adaptive Servo-Ventilator.  He was ultimately titrated to an EEP  pressure of 7 cm of H20 with a minimal pressure of 4 cm of H2O and a  maximum pressure of 10 cm of H2O with a respiratory rate of 12.  At  these final settings, it appeared that his sleep architecture had  improved considerably, and he did not have any further recurrence of his  central apneic event.  Additionally, it appeared that his oxygenation  had stabilized.  Also of  note, is that in the patient questionnaire, he  had reported that he had a very deep sleep and felt much better than  usual.      Coralyn Helling, MD  Diplomat, American Board of Sleep Medicine  Electronically Signed     VS/MEDQ  D:  12/05/2006 17:21:08  T:  12/05/2006 20:16:35  Job:  [id number]

## 2010-12-02 NOTE — Assessment & Plan Note (Signed)
[facility]                            CARDIOLOGY OFFICE NOTE   CORDE, [name]                       MRN:          [id number]  DATE:08/27/2006                            DOB:          April 29, 1927    PRIMARY CARE PHYSICIAN:  Gordy Savers, M.D.   REASON FOR VISIT:  Evaluate the patient with chest pain.   HISTORY OF PRESENT ILLNESS:  The patient is 77 years old.  He has a  history of coronary artery disease as described below.  He last saw our  P.A. in late January because of recurrent chest discomfort.  He had  taken three nitroglycerin in a week's time prior to that period.  He was  set up for a stress perfusion study.  This demonstrated an EF of 57%.  There was no ischemia or infarct noted.  He was placed on IMDUR.  He  says that since that time, he has not required any sublingual  nitroglycerin.  He does do cardiac rehab.  I have not had any reports of  new symptoms from cardiac rehab.  He is sleeping a little bit better and  less fatigued than he was.  He is back on his Simvastatin.  He has not  been having any chest pressure, arm discomfort, or neck discomfort.  He  does have occasional palpitations, but has no presyncope or syncope.  He  has no new shortness of breath, PND, or orthopnea.   PAST MEDICAL HISTORY:  Coronary artery disease (catheterization in April  of 2007 with an LAD 40% tubular lesion at the ostium, mid 50-60%  stenosis, diagonal 70% ostial stenosis, circumflex 30% stenosis at the  ostium and 40% stenosis of the first obtuse marginal, right coronary  artery dominant.  The stent with 30% in the mid right coronary artery.  The distal stent had 50-60% in-stent restenosis.  The PDA had 40%  stenosis.)  Moderate mitral regurgitation (mild on the last  echocardiogram), well-preserved ejection fraction, paroxysmal atrial  fibrillation (maintaining sinus rhythm currently), dyslipidemia,  hypertension, hypothyroidism,  prostatic hypertrophy, renal  insufficiency.  Sleep apnea.   ALLERGIES:  No known drug allergies.   MEDICATIONS:  1. Metoprolol 12.5 mg b.i.d.  2. Protonix 40 mg b.i.d.  3. Felodipine 5 mg daily.  4. Synthroid 25 mcg daily.  5. Zocor 40 mg daily.  6. Clonazepam.  7. Trazodone.  8. Aspirin 325 mg daily.  9. Multivitamin.  10.Coenzyme-Q.  11.IMDUR 30 mg daily.   REVIEW OF SYSTEMS:  As stated in the HPI and otherwise negative for  other systems.   PHYSICAL EXAMINATION:  GENERAL:  The patient is in no distress.  VITAL SIGNS:  Blood pressure 130/72, heart rate 60 and regular, weight  200 pounds.  HEENT:  Eyes unremarkable.  Pupils equal, round, and reactive to light.  Fundi not visualized.  NECK:  No jugular venous distention for 45 degrees.  Carotid upstroke  brisk and symmetric, no bruits, no thyromegaly.  LYMPHATICS:  No cervical, axillary, or inguinal adenopathy.  LUNGS:  Clear to auscultation bilaterally.  BACK:  No costovertebral  angle tenderness.  CHEST:  Unremarkable.  HEART:  PMI not displaced or sustained.  S1 and S2 within normal limits.  No S3, no S4. No clicks, no rubs, and no murmurs.  ABDOMEN:  Flat, positive bowel sounds, normal in frequency and pitch.  No bruits, no rebound, no guarding, no midline pulsatile mass, and no  organomegaly.  SKIN:  No rashes and no nodules.  EXTREMITIES:  2+ pulses, no cyanosis, clubbing, or edema.  NEUROLOGY:  Grossly intact.   ASSESSMENT:  1. Coronary artery disease.  The patient is having no new symptoms.      He had negative stress perfusion study.  He is doing better on the      IMDUR.  Therefore, I will continue this medication.  No further      cardiovascular testing is suggested.  2. Mitral regurgitation.  The patient had moderate mitral      regurgitation previously.  I am going to check an echocardiogram as      it has been a year.  3. Dyslipidemia.  He is now back on his Simvastatin for several weeks.      I am  going to check a fasting lipid profile and liver enzymes.  4. Sleep apnea.  The patient is not wearing his CPAP.  I encouraged      him to follow back up with Coralyn Helling, M.D. for further discussion      of this.   FOLLOWUP:  I will see him back in 6 months or sooner if needed.     Rollene Rotunda, MD, [facility]  Electronically Signed    JH/MedQ  DD: 08/27/2006  DT: 08/27/2006  Job #: [id number]   cc:   Gordy Savers, MD

## 2010-12-02 NOTE — Assessment & Plan Note (Signed)
[facility]                            CARDIOLOGY OFFICE NOTE   TREASURE, INGRUM                       MRN:          [id number]  DATE:10/16/2006                            DOB:          [birth year]    PRIMARY CARE PHYSICIAN:  Dr. Amador Cunas   REASON FOR PRESENTATION:  The patient with coronary disease.   HISTORY OF PRESENT ILLNESS:  The patient returns for routine followup.  He is 77 years old.  He is doing well since I last saw him.  He is  working with Dr. Craige Cotta on some sleep issues.  There probably is some  disordered sleep which may explain some of his fatigue.  He has  continued fatigue. He has had no problems at his maintenance phase at  cardiac rehab.  His blood pressures have been fluctuating mildly but  nothing alarming.  He has had no new chest pain.  His headaches have  resolved since he stopped taking Imdur.  He denies any shortness of  breath.   PAST MEDICAL HISTORY:  Coronary artery disease (see the August 27, 2006 note for details), mild to moderate mitral regurgitation (echo  repeated most recently February 2008), well preserved ejection fraction,  dyslipidemia, hypertension, hypothyroidism, prostatic hypertrophy, renal  insufficiency.   ALLERGIES:  None.   CURRENT MEDICATIONS:  1. Metoprolol 12.5 mg b.i.d.  2. Protonix 40 mg b.i.d.  3. Felodipine 5 mg daily.  4. Synthroid 25 mg daily.  5. Zocor 40 mg daily.  6. Clonazepam.  7. Trazodone.  8. Aspirin 325 mg daily.  9. Multivitamin.  10.Coenzyme Q.   REVIEW OF SYSTEMS:  As stated in the HPI.  Otherwise negative for other  systems.   PHYSICAL EXAMINATION:  The patient is in no distress.  Blood pressure 131/82, heart rate 76 and regular.  Weight 196 pounds.  Body mass index 26.  NECK:  No jugular venous distention at 45 degrees.  Carotid upstroke  brisk and symmetric, no bruits, no thyromegaly.  LYMPHATICS:  No lymphadenopathy.  LUNGS:  Are clear to auscultation  bilaterally.  CHEST:  Unremarkable.  HEART:  PMI not displaced or sustained. S1 and S2 within normal limits.  No S3, no S4, no clicks, no rubs, no murmurs.  ABDOMEN:  Flat, positive bowel sounds.  Normal in frequency, no pitch,  no bruits, no guarding or midline pulse.  No hepatosplenomegaly.  SKIN:  No rashes.  Denies.  EXTREMITIES:  2+ pulse, no edema.   ASSESSMENT AND PLAN:  1. Coronary disease.  The patient is having no new symptoms.  No      further cardiovascular testing is suggested.  He will continue with      medications as listed.  2. Mitral regurgitation.  We will follow this up with an      echocardiogram in 1 year.  3. Followup and see the patient back in 6 months or sooner if needed.     Rollene Rotunda, MD, [facility]  Electronically Signed    JH/MedQ  DD: 10/16/2006  DT: 10/16/2006  Job #: [id number]

## 2010-12-02 NOTE — Procedures (Signed)
NAME:  [name], [name] NO.:  [id number]   MEDICAL RECORD NO.:  [id number]          PATIENT TYPE:  OUT   LOCATION:  SLEEP CENTER                 FACILITY:  [facility]   PHYSICIAN:  Coralyn Helling, MD        DATE OF BIRTH:  Nov 30, 1926   DATE OF STUDY:  09/26/2006                            NOCTURNAL POLYSOMNOGRAM   FACILITY:  [facility].   REFERRING PHYSICIAN:  Dr. Coralyn Helling.   INDICATION FOR STUDY:  This is an individual who continues to have sleep  disruption with symptoms of excessive daytime sleepiness as well as  snoring. He does also have a history of REM sleep behavior disorder. He  had undergone an overnight polysomnogram on Dec 08, 2005 that showed  evidence for upper airway resistance syndrome and central apneic events  of sleep onset. He has returned to the sleep lab for further evaluation  of his sleep apnea.   EPWORTH SLEEPINESS SCORE:  Epworth score 17.   MEDICATIONS:  Protonix, felodipine, Synthroid, Zocor, clonazepam,  trazodone, aspirin. The patient took a Klonopin, trazodone, and Zocor on  the night of the study. He also complained of a headache and took two  Tylenol at 2:20 in the morning.   SLEEP ARCHITECTURE:  Total recording time was 444 minutes. Total sleep  time was 309 minutes. Sleep efficiency was 70% which is reduced. The  sleep latency was 17 minutes. REM latency was 127 minutes. The patient  appeared to have difficulty with sleep initiation and sleep maintenance  due to central apneic events with sleep onset which were associated with  some degree of oxygen desaturation. The study was notable for the lack  of slow wave sleep and a relative reduction in the percentage of REM  sleep to 15% of the study. The patient slept exclusively in the  nonsupine position.   RESPIRATORY DATA:  The average respiratory rate was 14. The apnea-  hypopnea index was 0.8. The events were exclusively obstructive in  nature. Moderate snoring was  noted by the technician.   OXYGEN DATA:  The baseline oxygenation was 97%. The oxygen saturation  nadir was 92% associated with respiratory event. The oxygen saturation  nadir was 89% during sleep transition which was associated with central  apneic events with sleep onset.   CARDIAC DATA:  The average heart rate was 66. The rhythm strip showed  normal sinus rhythm with frequent PVCs and PACs and two episodes of  supraventricular tachycardia the longest lasting 15 seconds.   MOVEMENT-PARASOMNIA:  The periodic limb index was 0.4. The patient had  one bathroom trip. No other abnormal behaviors were noted. The patient  did complain of sinus congestion on the night of the study.   IMPRESSIONS/RECOMMENDATIONS:  This study did not show evidence for  obstructive sleep apnea as the apnea-hypopnea index was 0.8.   He did have again noted central apneic events of sleep onset which were  associated with some degree of oxygen saturation. Clinical correlation  will be necessary to determine the significance of this and to determine  whether supplemental oxygen may be beneficial. Of note also is that he  did not have supine sleep, and therefore any degree of sleep apnea may  be underestimated.   He had two episodes of supraventricular tachycardia the longest lasting  15 seconds, and clinical correlation will be necessary to determine if  any further interventions will be needed for this.      Coralyn Helling, MD  Diplomat, American Board of Sleep Medicine  Electronically Signed     VS/MEDQ  D:  10/10/2006 11:00:21  T:  10/10/2006 11:39:05  Job:  [id number]   cc:   Rollene Rotunda, MD, [facility]  [address]  [address]  [place]  [place] [address]

## 2010-12-02 NOTE — Assessment & Plan Note (Signed)
[facility]                             PULMONARY OFFICE NOTE   EDISON, NICHOLSON                       MRN:          [id number]  DATE:09/26/2006                            DOB:          03-03-1927    REASON FOR VISIT:  I saw [name] today for followup of his insomnia  and abnormal behavior with sleep as well as upper airway resistance  syndrome.  He continues to use his Klonopin 1 mg and trazodone 50 mg at  night.  He says he usually takes these at about 8 o'clock at night.  He  will then go to sleep at about 10 and wake up at 4.  He says he will  wake up to use the bathroom at 4:00 but then has difficulty falling back  to sleep.  He will try to read or work at the computer.  He does fall  asleep for about a half an hour between 6 and 6:30.  He says that when  he does wake up in the morning he still feels tired.  He says that his  son has also noticed that he does have frequent episodes where he stops  breathing while he is asleep and that he does snore quite a bit as well.   ASSESSMENT/PLAN:  The concern that I have right now is that since [name]  has been on Klonopin this could be exacerbating any degree of sleep  disorder breathing that he has.  Clearly he needs to remain on the  Klonopin to control his abnormal behaviors during sleep and therefore I  feel that it would be warranted for him to undergo a repeat overnight  polysomnogram to further assess the possibility of him having sleep  disorder breathing, again, particularly since he has had the addition of  the Klonopin and Trazodone since his last sleep test.  Additionally, I  have suggested that he could try delaying the timing of his dosage of  his sleep medicines to 9:00 o'clock to see if this would allow him to  sleep through the night somewhat better.   I will follow up with him after review of his sleep tests.     Kevin Helling, MD  Electronically Signed    VS/MedQ  DD:  09/26/2006  DT: 09/28/2006  Job #: [id number]   cc:   Kevin Savers, MD  Kevin Rotunda, MD, [facility]

## 2010-12-02 NOTE — Assessment & Plan Note (Signed)
[name]                            CARDIOLOGY OFFICE NOTE   [name], [name]                       MRN:          [id number]  DATE:08/15/2006                            DOB:          1927/03/12    CARDIOLOGIST:  Rollene Rotunda, MD, [facility]   PRIMARY CARE PHYSICIAN:  Gordy Savers, MD   HISTORY OF PRESENT ILLNESS:  [name] is a 77 year old male patient  followed by Dr. Antoine Poche with a history of coronary artery disease  status post inferior myocardial infarction April 2006 treated with a non-  drug eluting stent to the proximal and distal RCA with nonobstructive  disease by cardiac catheterization April 2007 as well as history of  atrial fibrillation, currently maintaining sinus rhythm, who presents to  the office today for chest pain.  He had recently been to cardiac rehab  and had noticed some chest discomfort and was asked to follow up here  today.  He has had a long history of chest pain and was actually seen by  me last March and underwent a Cardiolite study that showed no ischemia.  He was admitted to the hospital shortly after that and underwent cardiac  catheterization.  This revealed moderate diffuse coronary disease, and  he was actually seen by Dr. Tyrone Sage in CVTS consultation.  At that  time, it was not felt that coronary artery bypass grafting would provide  much benefit, and he was treated medically.  Since that time, he has had  atypical chest pains off and on.  Of note, he did have Barrett's  esophagus diagnosed by endoscopy at the time of his admission, and he  was placed on twice daily proton pump inhibitor therapy.  He has had  quite a bit of fatigue and has followed with Dr. Craige Cotta.  He was unable to  tolerate CPAP, but is currently on Klonopin as well as Trazodone q.h.s.,  and the patient tells me today that his sleep patterns are better and  his fatigue overall has improved.   The patient notes that over the last  couple of weeks his chest  discomfort has increased in frequency, and he has had to take  nitroglycerin.  This is the same kind of pain he has had in the past.  He says that it feels similar to his myocardial infarction pain, but is  no where near as intense.  The intensity of the pain has not changed at  all.  He does have some left arm symptoms associated with it.  He does  get short of breath with exertion at times, and this is worse than he  usually has.  He denies any syncope or near syncope.  Denies any  orthopnea or paroxysmal nocturnal dyspnea.  He denies any associated  nausea or diaphoresis with pain.  He usually takes nitroglycerin and his  pain lasts less than a minute.   CURRENT MEDICATIONS:  1. Metoprolol 25 mg half tablet b.i.d.  2. Protonix 40 mg b.i.d.  3. Felodipine 5 mg daily.  4. Synthroid 25 mcg daily.  5. Zocor 40 mg  q.h.s.  6. Clonazepam 1 mg daily.  7. Trazodone 50 mg q.h.s.  8. Aspirin 325 mg daily.  9. Multivitamin daily.  10.Coenzyme Q-10.  11.Nitroglycerin p.r.n.   ALLERGIES:  No known drug allergies.   SOCIAL HISTORY:  Denies any tobacco abuse.  He does not history of  exposure to asbestos when he was younger.   REVIEW OF SYSTEMS:  Please see HPI.  He has had some sinus drainage with  epistaxis.  He denies any melena or hematochezia, hematuria, dysuria.  Denies any hematemesis.  He has followed up with Dr. Tyrone Sage regarding  possible popliteal artery aneurysms, but follow up studies were negative  and no further workup is planned.  He does note positive water brash  symptoms as well as frequent belching.   PHYSICAL EXAMINATION:  GENERAL:  Well-developed, well-nourished male.  VITAL SIGNS:  Blood pressure 126/81, pulse 60, weight 199 pounds.  HEENT:  Unremarkable.  NECK:  Without JVD.  Carotids without bruits bilaterally.  CARDIAC:  Normal S1, S2.  Regular rate and rhythm without murmurs.  LUNGS:  Clear to auscultation bilaterally without  wheezes, rales or  rhonchi.  ABDOMEN:  Soft, nontender with normal bowel sounds.  No organomegaly.  EXTREMITIES:  Without edema.  Calves are soft and nontender.  SKIN:  Warm and dry.  NEUROLOGICAL:  Alert and oriented x3.  Cranial nerves II-XII grossly  intact.   STUDIES:  Electrocardiogram reveals sinus rhythm with a heart rate of  60.  Incomplete right bundle branch block, normal axis, no acute  changes, no significant changes in his previous tracing.   IMPRESSION:  1. Chest pain.  2. Dyspnea.  3. Coronary artery disease.      a.     History of stenting to the obtuse marginal in 2002.      b.     Status post inferior myocardial infarction treated with non-       drug eluding stent to the proximal and distal RCA, April 2006.      c.     Nonischemic Myoview April 2007.      d.     Cardiac catheterization April 2007.  LAD 40% ostial, 50-60%       mid, large diagonal with 70% ostial and diffuse proximal to mid;       left circumflex 30% ostial, obtuse marginal 1 40%; RCA 30% in-       stent restenosis at the distal edge of the previously placed       stent, distal RCA with previous stenting with 50-60% in-stent       restenosis at the distal edge of the stent, 30% distal RCA; PDA       mid 40% and posterolateral with 30% stenosis.  4. Good LV function.  5. Hypertension.  6. Hyperlipidemia.  7. Atrial fibrillation.      a.     Currently maintaining sinus rhythm.      b.     Previously on Amiodarone therapy, discontinued secondary to       fatigue.  8. Fatigue.  9. Upper airway resistance syndrome, followed by Dr. Craige Cotta.  10.Treated hypothyroidism.  11.History of renal insufficiency.  12.History of benign prosthetic hypertrophy.  13.Mild mitral regurgitation.  14.Gastroesophageal reflux disease.      a.     History of Mallory-Weiss tear.      b.     History of Barrett's esophagus by endoscopy April 2007.  PLAN:  The patient presented to the office  today with complaints of   chest pain and shortness of breath with exertion.  He has had a pattern  of chest pain that has been atypical for quite some time now.  He was  actually evaluated by cardiac surgery during his last hospitalization  after cardiac catheterization.  Dr. Tyrone Sage was concerned that his  symptoms may represent something other than classical anginal symptoms,  and it was not felt that bypass surgery would benefit his symptoms.  He  has been treated medically.  His symptoms have changed over the last two  weeks in that he has had more frequent symptoms.  The intensity of his  symptoms and quality of those have not changed.  His fatigue overall has  improved, but he does feel short of breath with exertion, especially  during cardiac rehab.  There are other times when he can exert himself  and he does not have symptoms.   Overall, his symptoms are difficult to discern, and the exact etiology  is not clear to me at this time.  He has several reasons to have chest  pain.  It is possible that his symptoms could be secondary to anxiety,  and this should be kept on the differential.  I think at this point in  time, we will proceed with an Adenosine Myoview study to compare this to  the study done last April.  If there has been absolutely no change, then  no further cardiovascular testing would be warranted.  Nitroglycerin  does make his pain better.  I have elected to place him on Imdur 30 mg  daily.  We will get some baseline labs with a C-MET, CBC and a BNP.  He  had a chest x-ray at the [place] about four months ago.  We will try to  obtain that result.   He says that he has had some exposure to asbestos in the past.  We may  want to think about a CT scan of the chest in the future should the  above testing be unrevealing, but I will leave that up to Dr. Antoine Poche  and/or Dr. Amador Cunas, his primary care physician.  He also may need to  return to follow up with Dr. Matthias Hughs sooner as he is already on  b.i.d.  proton pump inhibitor therapy.  I question whether or not the chest pain  could be related to his Barrett's esophagus and acid reflux disease.  Again, anxiety could be playing a role.  He actually expressed to me  that he thought this could be the case.   He will come back to see Dr. Antoine Poche in follow up in the next couple of  weeks.  If his nuclear study is negative for ischemia, then we may want  to consider referring him back to Dr. Amador Cunas for follow up on  anxiety.      Kevin Newcomer, PA-C  Electronically Signed      Bevelyn Buckles. Bensimhon, MD  Electronically Signed   SW/MedQ  DD: 08/15/2006  DT: 08/15/2006  Job #: [id number]   cc:   Rollene Rotunda, MD, [facility]  Gordy Savers, MD  Coralyn Helling, MD  Bernette Redbird, M.D.

## 2010-12-02 NOTE — Assessment & Plan Note (Signed)
[facility]                              CARDIOLOGY OFFICE NOTE   Deon, Duer KYRO JOSWICK                       MRN:          [id number]  DATE:04/19/2006                            DOB:          [birth year]    REASON FOR PRESENTATION:  Patient with coronary disease and fatigue.   HISTORY OF PRESENT ILLNESS:  The patient is a 77 year old gentleman who  presents for follow-up.  Since I last saw him he has continued to be  bothered by fatigue.  He has joined cardiac rehabilitation.  He says it is  hard for him to do the activities because he is tired.  He did have some  high blood pressure and was restarted on a low dose of beta blocker.  This  made no difference to his fatigue.  In addition, he started trazodone for  sleep; he does not think that this has helped.  He did follow with Dr. Craige Cotta,  who thought he had some sleep disturbance.  He wondered if he might benefit  from CPAP, but he was not able to tolerate it.  He has a follow-up with Dr.  Craige Cotta planned.   The patient has had some chest discomfort.  This has been on his left side  under his left breast.  It is sharp.  It happens when he uses his arms at  rehab.  He will occasionally take a nitroglycerin, perhaps one time a month.  This has been a stable pattern.  He has not been having any resting  shortness of breath, chest discomfort, neck or arm discomfort.  He has not  had any new palpitations.  He had no presyncope or syncope.   PAST MEDICAL HISTORY:  1. Coronary artery disease (catheterization April 2007, with an LAD 40%      tubular lesion in the ostium, mid section 50-60% stenosis, diagonal 70%      ostial stenosis, circumflex 30% stenosis at the ostium and 40% stenosis      of the first obtuse marginal, right coronary artery is dominant.  There      is a stent with 30% in the mid-right coronary artery.  Distal stent had      50-60% in-stent restenosis. The PDA had a 40% stenosis.)   Moderate      mitral regurgitation (only mild on the last echocardiogram).  Well      preserved ejection fraction.  Atrial fibrillation.  Maintaining sinus      rhythm currently.  2. Dyslipidemia.  3. Hypertension.  4. Hypothyroidism.  5. Prostatic hypertrophy.  6. Renal insufficiency.   ALLERGIES:  NONE.   MEDICATIONS:  1. Metoprolol 12.5 mg b.i.d.  2. Protonix 40 mg b.i.d.  3. Felodipine 5 mg daily.  4. Synthroid 25 mcg daily.  5. Zocor 40 mg a day.  6. Clonazepam 1 mg daily.  7. Trazodone 50 mg daily.  8. Aspirin 325 mg daily.  9. Multivitamin  10.Co-enzyme Q.   REVIEW OF SYSTEMS:  As stated in the HPI; otherwise negative for other  systems.   PHYSICAL EXAMINATION:  GENERAL:  The patient is in no distress.  VITAL SIGNS:  Blood pressure 118/68, heart rate 59 and irregular, weight 189  pounds.  HEENT:  Eyes:  EOMI, pupils equal, round and reactive to light.  Fundi not  visualized.  NECK:  No jugular venous distention at 45 degrees, carotid upstroke brisk  and symmetric without bruits.  No thyromegaly.  LYMPHATICS:  No nodes.  LUNGS:  Clear to auscultation bilaterally.  BACK:  No costovertebral angle tenderness.  CHEST:  Unremarkable.  HEART:  PMI not displaced, with sustained S1 and S2 within normal limits.  No S3, no S4; no click or murmurs.  ABDOMEN:  Flat, positive bowel sounds, normal in frequency and pitch.  No  bruits, rebound or guarding.  No midline pulsatile mass, no organomegaly.  SKIN:  No rashes, no nodules.  EXTREMITIES:  2+ pulses.  No edema.   ASSESSMENT AND PLAN:  1,  FATIGUE.  I am going to have the patient come off  his Zocor for one month, to see if this helps him at all.  This is a  profound problem.  He is continuing to follow with Dr. Craige Cotta and Dr.  Amador Cunas for evaluation of this as well.  I doubt a cardiac etiology.  1. CHEST DISCOMFORT.  This is a stable pattern of fleeting atypical pain.      No further cardiovascular testing is  suggested.  He will continue with      risk reduction.  2. ATRIAL FIBRILLATION.  He is maintaining sinus rhythm off of amiodarone.      We have discussed at length, the risks of stroke should he have a      recurring atrial fibrillation.  However, he feels strongly that he      would feel this as he always has in the past, and that he will let me      know as soon as he feels an irregular heartbeat or palpitations.  He      would prefer not to be on the Coumadin at this point.   FOLLOWUP:  I will see him back in 6 months or sooner.            ______________________________  Rollene Rotunda, MD, [facility]     JH/MedQ  DD:  04/19/2006  DT:  04/20/2006  Job #:  [id number]

## 2010-12-07 ENCOUNTER — Encounter (HOSPITAL_COMMUNITY): Payer: Self-pay | Attending: Cardiology

## 2010-12-07 DIAGNOSIS — E785 Hyperlipidemia, unspecified: Secondary | ICD-10-CM | POA: Insufficient documentation

## 2010-12-07 DIAGNOSIS — Z79899 Other long term (current) drug therapy: Secondary | ICD-10-CM | POA: Insufficient documentation

## 2010-12-07 DIAGNOSIS — Z8249 Family history of ischemic heart disease and other diseases of the circulatory system: Secondary | ICD-10-CM | POA: Insufficient documentation

## 2010-12-07 DIAGNOSIS — I2510 Atherosclerotic heart disease of native coronary artery without angina pectoris: Secondary | ICD-10-CM | POA: Insufficient documentation

## 2010-12-07 DIAGNOSIS — I080 Rheumatic disorders of both mitral and aortic valves: Secondary | ICD-10-CM | POA: Insufficient documentation

## 2010-12-07 DIAGNOSIS — Z7901 Long term (current) use of anticoagulants: Secondary | ICD-10-CM | POA: Insufficient documentation

## 2010-12-07 DIAGNOSIS — I4891 Unspecified atrial fibrillation: Secondary | ICD-10-CM | POA: Insufficient documentation

## 2010-12-07 DIAGNOSIS — I76 Septic arterial embolism: Secondary | ICD-10-CM | POA: Insufficient documentation

## 2010-12-07 DIAGNOSIS — I10 Essential (primary) hypertension: Secondary | ICD-10-CM | POA: Insufficient documentation

## 2010-12-07 DIAGNOSIS — Z5189 Encounter for other specified aftercare: Secondary | ICD-10-CM | POA: Insufficient documentation

## 2010-12-07 DIAGNOSIS — G4730 Sleep apnea, unspecified: Secondary | ICD-10-CM | POA: Insufficient documentation

## 2010-12-07 DIAGNOSIS — K219 Gastro-esophageal reflux disease without esophagitis: Secondary | ICD-10-CM | POA: Insufficient documentation

## 2010-12-08 ENCOUNTER — Encounter (HOSPITAL_COMMUNITY): Payer: Self-pay

## 2010-12-13 ENCOUNTER — Encounter (HOSPITAL_COMMUNITY): Payer: Self-pay

## 2010-12-14 ENCOUNTER — Encounter (HOSPITAL_COMMUNITY): Payer: Self-pay

## 2010-12-15 ENCOUNTER — Encounter (HOSPITAL_COMMUNITY): Payer: Self-pay

## 2010-12-20 ENCOUNTER — Encounter (HOSPITAL_COMMUNITY): Payer: Self-pay | Attending: Cardiology

## 2010-12-20 DIAGNOSIS — K219 Gastro-esophageal reflux disease without esophagitis: Secondary | ICD-10-CM | POA: Insufficient documentation

## 2010-12-20 DIAGNOSIS — I080 Rheumatic disorders of both mitral and aortic valves: Secondary | ICD-10-CM | POA: Insufficient documentation

## 2010-12-20 DIAGNOSIS — Z7901 Long term (current) use of anticoagulants: Secondary | ICD-10-CM | POA: Insufficient documentation

## 2010-12-20 DIAGNOSIS — G4730 Sleep apnea, unspecified: Secondary | ICD-10-CM | POA: Insufficient documentation

## 2010-12-20 DIAGNOSIS — Z8249 Family history of ischemic heart disease and other diseases of the circulatory system: Secondary | ICD-10-CM | POA: Insufficient documentation

## 2010-12-20 DIAGNOSIS — I76 Septic arterial embolism: Secondary | ICD-10-CM | POA: Insufficient documentation

## 2010-12-20 DIAGNOSIS — I2510 Atherosclerotic heart disease of native coronary artery without angina pectoris: Secondary | ICD-10-CM | POA: Insufficient documentation

## 2010-12-20 DIAGNOSIS — Z79899 Other long term (current) drug therapy: Secondary | ICD-10-CM | POA: Insufficient documentation

## 2010-12-20 DIAGNOSIS — Z5189 Encounter for other specified aftercare: Secondary | ICD-10-CM | POA: Insufficient documentation

## 2010-12-20 DIAGNOSIS — I4891 Unspecified atrial fibrillation: Secondary | ICD-10-CM | POA: Insufficient documentation

## 2010-12-20 DIAGNOSIS — E785 Hyperlipidemia, unspecified: Secondary | ICD-10-CM | POA: Insufficient documentation

## 2010-12-20 DIAGNOSIS — I10 Essential (primary) hypertension: Secondary | ICD-10-CM | POA: Insufficient documentation

## 2010-12-21 ENCOUNTER — Encounter (HOSPITAL_COMMUNITY): Payer: Self-pay

## 2010-12-22 ENCOUNTER — Encounter (HOSPITAL_COMMUNITY): Payer: Self-pay

## 2010-12-27 ENCOUNTER — Encounter (HOSPITAL_COMMUNITY): Payer: Self-pay

## 2010-12-27 ENCOUNTER — Encounter: Payer: Self-pay | Admitting: Cardiology

## 2010-12-28 ENCOUNTER — Encounter (HOSPITAL_COMMUNITY): Payer: Self-pay

## 2010-12-30 ENCOUNTER — Encounter (HOSPITAL_COMMUNITY): Payer: Self-pay

## 2011-01-03 ENCOUNTER — Encounter (HOSPITAL_COMMUNITY): Payer: Self-pay

## 2011-01-04 ENCOUNTER — Encounter (HOSPITAL_COMMUNITY): Payer: Self-pay

## 2011-01-05 ENCOUNTER — Encounter (HOSPITAL_COMMUNITY): Payer: Self-pay

## 2011-01-09 ENCOUNTER — Ambulatory Visit (INDEPENDENT_AMBULATORY_CARE_PROVIDER_SITE_OTHER): Payer: Medicare Other | Admitting: Internal Medicine

## 2011-01-09 ENCOUNTER — Encounter: Payer: Self-pay | Admitting: Internal Medicine

## 2011-01-09 DIAGNOSIS — R5383 Other fatigue: Secondary | ICD-10-CM

## 2011-01-09 DIAGNOSIS — E039 Hypothyroidism, unspecified: Secondary | ICD-10-CM

## 2011-01-09 DIAGNOSIS — R5381 Other malaise: Secondary | ICD-10-CM

## 2011-01-09 DIAGNOSIS — I4891 Unspecified atrial fibrillation: Secondary | ICD-10-CM

## 2011-01-09 DIAGNOSIS — I10 Essential (primary) hypertension: Secondary | ICD-10-CM

## 2011-01-09 DIAGNOSIS — I2510 Atherosclerotic heart disease of native coronary artery without angina pectoris: Secondary | ICD-10-CM

## 2011-01-09 LAB — CBC WITH DIFFERENTIAL/PLATELET
Basophils Relative: 0.6 % (ref 0.0–3.0)
Eosinophils Absolute: 0.1 10*3/uL (ref 0.0–0.7)
HCT: 34.3 % — ABNORMAL LOW (ref 39.0–52.0)
Hemoglobin: 11.6 g/dL — ABNORMAL LOW (ref 13.0–17.0)
Lymphocytes Relative: 27.8 % (ref 12.0–46.0)
MCHC: 33.8 g/dL (ref 30.0–36.0)
MCV: 90.3 fl (ref 78.0–100.0)
Neutro Abs: 3.1 10*3/uL (ref 1.4–7.7)
RBC: 3.81 Mil/uL — ABNORMAL LOW (ref 4.22–5.81)

## 2011-01-09 LAB — HEPATIC FUNCTION PANEL
Albumin: 4.3 g/dL (ref 3.5–5.2)
Alkaline Phosphatase: 78 U/L (ref 39–117)
Total Protein: 6.8 g/dL (ref 6.0–8.3)

## 2011-01-09 LAB — BASIC METABOLIC PANEL
CO2: 26 mEq/L (ref 19–32)
Calcium: 8.9 mg/dL (ref 8.4–10.5)
Creatinine, Ser: 1.4 mg/dL (ref 0.4–1.5)
Glucose, Bld: 81 mg/dL (ref 70–99)

## 2011-01-09 LAB — TSH: TSH: 3.98 u[IU]/mL (ref 0.35–5.50)

## 2011-01-09 NOTE — Progress Notes (Signed)
  Subjective:    Patient ID: [name], male    DOB: 06-27-1927, 77 y.o.   MRN: [id number]  HPI  77 year old patient who is seen today for followup. He is accompanied  by his son. He has a long history of chronic fatigue and weakness.  He has discontinued simvastatin one week ago to see if this improves his symptoms. All his medications were reviewed at length. He has given himself a trial off Klonopin which failed due to extreme restless legs. He remains on low-dose amlodipine if he holds on days where his blood pressure is low normal. His cardiopulmonary status appears stable   Review of Systems  Constitutional: Positive for fatigue. Negative for fever, chills and appetite change.  HENT: Negative for hearing loss, ear pain, congestion, sore throat, trouble swallowing, neck stiffness, dental problem, voice change and tinnitus.   Eyes: Negative for pain, discharge and visual disturbance.  Respiratory: Negative for cough, chest tightness, wheezing and stridor.   Cardiovascular: Negative for chest pain, palpitations and leg swelling.  Gastrointestinal: Negative for nausea, vomiting, abdominal pain, diarrhea, constipation, blood in stool and abdominal distention.  Genitourinary: Negative for urgency, hematuria, flank pain, discharge, difficulty urinating and genital sores.  Musculoskeletal: Negative for myalgias, back pain, joint swelling, arthralgias and gait problem.  Skin: Negative for rash.  Neurological: Positive for weakness. Negative for dizziness, syncope, speech difficulty, numbness and headaches.  Hematological: Negative for adenopathy. Does not bruise/bleed easily.  Psychiatric/Behavioral: Negative for behavioral problems and dysphoric mood. The patient is not nervous/anxious.        Objective:   Physical Exam  Constitutional: He is oriented to person, place, and time. He appears well-developed.       Blood pressure 110/80  HENT:  Head: Normocephalic.  Right Ear: External ear  normal.  Left Ear: External ear normal.  Eyes: Conjunctivae and EOM are normal.  Neck: Normal range of motion.  Cardiovascular: Normal rate and normal heart sounds.   Pulmonary/Chest: Breath sounds normal.  Abdominal: Bowel sounds are normal.  Musculoskeletal: Normal range of motion. He exhibits no edema and no tenderness.  Neurological: He is alert and oriented to person, place, and time.  Psychiatric: He has a normal mood and affect. His behavior is normal.          Assessment & Plan:   Chronic fatigue. Will hold the statin therapy for another 3 weeks and then will resume it he notes no improvement Hypertension well controlled Hypothyroidism we'll check a TSH and lab update Chronic atrial fibrillation. Presently in a normal sinus rhythm. We'll continue aspirin

## 2011-01-09 NOTE — Patient Instructions (Signed)
Limit your sodium (Salt) intake    It is important that you exercise regularly, at least 20 minutes 3 to 4 times per week.  If you develop chest pain or shortness of breath seek  medical attention.  Return in 3 months for follow-up  

## 2011-01-10 ENCOUNTER — Encounter (HOSPITAL_COMMUNITY): Payer: Self-pay

## 2011-01-11 ENCOUNTER — Encounter (HOSPITAL_COMMUNITY): Payer: Self-pay

## 2011-01-12 ENCOUNTER — Encounter (HOSPITAL_COMMUNITY): Payer: Self-pay

## 2011-01-17 ENCOUNTER — Encounter (HOSPITAL_COMMUNITY): Payer: Self-pay | Attending: Cardiology

## 2011-01-17 DIAGNOSIS — I10 Essential (primary) hypertension: Secondary | ICD-10-CM | POA: Insufficient documentation

## 2011-01-17 DIAGNOSIS — Z7901 Long term (current) use of anticoagulants: Secondary | ICD-10-CM | POA: Insufficient documentation

## 2011-01-17 DIAGNOSIS — Z5189 Encounter for other specified aftercare: Secondary | ICD-10-CM | POA: Insufficient documentation

## 2011-01-17 DIAGNOSIS — Z79899 Other long term (current) drug therapy: Secondary | ICD-10-CM | POA: Insufficient documentation

## 2011-01-17 DIAGNOSIS — G4730 Sleep apnea, unspecified: Secondary | ICD-10-CM | POA: Insufficient documentation

## 2011-01-17 DIAGNOSIS — I2510 Atherosclerotic heart disease of native coronary artery without angina pectoris: Secondary | ICD-10-CM | POA: Insufficient documentation

## 2011-01-17 DIAGNOSIS — E785 Hyperlipidemia, unspecified: Secondary | ICD-10-CM | POA: Insufficient documentation

## 2011-01-17 DIAGNOSIS — I4891 Unspecified atrial fibrillation: Secondary | ICD-10-CM | POA: Insufficient documentation

## 2011-01-17 DIAGNOSIS — I76 Septic arterial embolism: Secondary | ICD-10-CM | POA: Insufficient documentation

## 2011-01-17 DIAGNOSIS — K219 Gastro-esophageal reflux disease without esophagitis: Secondary | ICD-10-CM | POA: Insufficient documentation

## 2011-01-17 DIAGNOSIS — I080 Rheumatic disorders of both mitral and aortic valves: Secondary | ICD-10-CM | POA: Insufficient documentation

## 2011-01-17 DIAGNOSIS — Z8249 Family history of ischemic heart disease and other diseases of the circulatory system: Secondary | ICD-10-CM | POA: Insufficient documentation

## 2011-01-18 ENCOUNTER — Encounter (HOSPITAL_COMMUNITY): Payer: Self-pay

## 2011-01-19 ENCOUNTER — Encounter (HOSPITAL_COMMUNITY): Payer: Self-pay

## 2011-01-24 ENCOUNTER — Encounter (HOSPITAL_COMMUNITY): Payer: Self-pay

## 2011-01-25 ENCOUNTER — Encounter (HOSPITAL_COMMUNITY): Payer: Self-pay

## 2011-01-26 ENCOUNTER — Encounter (HOSPITAL_COMMUNITY): Payer: Self-pay

## 2011-01-31 ENCOUNTER — Encounter (HOSPITAL_COMMUNITY): Payer: Self-pay

## 2011-02-01 ENCOUNTER — Encounter (HOSPITAL_COMMUNITY): Payer: Self-pay

## 2011-02-02 ENCOUNTER — Encounter (HOSPITAL_COMMUNITY): Payer: Self-pay

## 2011-02-07 ENCOUNTER — Encounter (HOSPITAL_COMMUNITY): Payer: Self-pay

## 2011-02-08 ENCOUNTER — Encounter (HOSPITAL_COMMUNITY): Payer: Self-pay

## 2011-02-09 ENCOUNTER — Encounter (HOSPITAL_COMMUNITY): Payer: Self-pay

## 2011-02-14 ENCOUNTER — Encounter (HOSPITAL_COMMUNITY): Payer: Self-pay

## 2011-02-15 ENCOUNTER — Encounter (HOSPITAL_COMMUNITY): Payer: Self-pay | Attending: Cardiology

## 2011-02-15 DIAGNOSIS — Z5189 Encounter for other specified aftercare: Secondary | ICD-10-CM | POA: Insufficient documentation

## 2011-02-15 DIAGNOSIS — I10 Essential (primary) hypertension: Secondary | ICD-10-CM | POA: Insufficient documentation

## 2011-02-15 DIAGNOSIS — K219 Gastro-esophageal reflux disease without esophagitis: Secondary | ICD-10-CM | POA: Insufficient documentation

## 2011-02-15 DIAGNOSIS — I76 Septic arterial embolism: Secondary | ICD-10-CM | POA: Insufficient documentation

## 2011-02-15 DIAGNOSIS — Z79899 Other long term (current) drug therapy: Secondary | ICD-10-CM | POA: Insufficient documentation

## 2011-02-15 DIAGNOSIS — I2510 Atherosclerotic heart disease of native coronary artery without angina pectoris: Secondary | ICD-10-CM | POA: Insufficient documentation

## 2011-02-15 DIAGNOSIS — E785 Hyperlipidemia, unspecified: Secondary | ICD-10-CM | POA: Insufficient documentation

## 2011-02-15 DIAGNOSIS — G4730 Sleep apnea, unspecified: Secondary | ICD-10-CM | POA: Insufficient documentation

## 2011-02-15 DIAGNOSIS — I080 Rheumatic disorders of both mitral and aortic valves: Secondary | ICD-10-CM | POA: Insufficient documentation

## 2011-02-15 DIAGNOSIS — Z7901 Long term (current) use of anticoagulants: Secondary | ICD-10-CM | POA: Insufficient documentation

## 2011-02-15 DIAGNOSIS — I4891 Unspecified atrial fibrillation: Secondary | ICD-10-CM | POA: Insufficient documentation

## 2011-02-15 DIAGNOSIS — Z8249 Family history of ischemic heart disease and other diseases of the circulatory system: Secondary | ICD-10-CM | POA: Insufficient documentation

## 2011-02-16 ENCOUNTER — Encounter (HOSPITAL_COMMUNITY): Payer: Self-pay

## 2011-02-21 ENCOUNTER — Encounter (HOSPITAL_COMMUNITY): Payer: Self-pay

## 2011-02-22 ENCOUNTER — Encounter (HOSPITAL_COMMUNITY): Payer: Self-pay

## 2011-02-23 ENCOUNTER — Encounter (HOSPITAL_COMMUNITY): Payer: Self-pay

## 2011-02-28 ENCOUNTER — Encounter (HOSPITAL_COMMUNITY): Payer: Self-pay

## 2011-03-01 ENCOUNTER — Encounter (HOSPITAL_COMMUNITY): Payer: Self-pay

## 2011-03-02 ENCOUNTER — Encounter (HOSPITAL_COMMUNITY): Payer: Self-pay

## 2011-03-06 ENCOUNTER — Telehealth: Payer: Self-pay | Admitting: Cardiology

## 2011-03-06 NOTE — Telephone Encounter (Signed)
Pt woke up Saturday and he had chest pain and b/p was 95/45

## 2011-03-06 NOTE — Telephone Encounter (Signed)
Spoke with pt who states he had chest pain with sweating on Saturday.  Reports that it was a dull ache across chest with shoulder weakness.  He reports using SL Ntg which didn't relieve the chest pain but then he realized it was old so he got a new bottle.  After the third Ntg the pain did resolve and he has not had any since.  He also reports a decrease in his BP during this episode.  BP today 129/81.  He feels very weak but no other s/s.  He has an appointment for evaluation tomorrow.

## 2011-03-07 ENCOUNTER — Encounter: Payer: Self-pay | Admitting: Physician Assistant

## 2011-03-07 ENCOUNTER — Encounter (HOSPITAL_COMMUNITY): Payer: Self-pay

## 2011-03-07 ENCOUNTER — Ambulatory Visit (INDEPENDENT_AMBULATORY_CARE_PROVIDER_SITE_OTHER): Payer: Medicare Other | Admitting: Physician Assistant

## 2011-03-07 DIAGNOSIS — I4891 Unspecified atrial fibrillation: Secondary | ICD-10-CM

## 2011-03-07 DIAGNOSIS — I10 Essential (primary) hypertension: Secondary | ICD-10-CM

## 2011-03-07 DIAGNOSIS — I2510 Atherosclerotic heart disease of native coronary artery without angina pectoris: Secondary | ICD-10-CM

## 2011-03-07 DIAGNOSIS — R079 Chest pain, unspecified: Secondary | ICD-10-CM | POA: Insufficient documentation

## 2011-03-07 MED ORDER — PRAVASTATIN SODIUM 40 MG PO TABS: 40.0000 mg | ORAL_TABLET | Freq: Every evening | ORAL | Status: DC

## 2011-03-07 MED ORDER — ISOSORBIDE MONONITRATE ER 30 MG PO TB24: 30.0000 mg | ORAL_TABLET | Freq: Every day | ORAL | Status: DC

## 2011-03-07 NOTE — Assessment & Plan Note (Signed)
Blood pressure is elevated today, hopefully the Imdur will help.

## 2011-03-07 NOTE — Progress Notes (Signed)
HPI: This is an 77 year old white male patient with history of coronary artery disease status for pain stents in the past. His last catheter in 2007 showed a 40% tubular lesion in the ostium of the LAD, 50-60% mid LAD, 70% diagonal, 30% circumflex, 40% OM, RCA dominant. The stent in the RCA had 30% mid and the distal stent had 50-60% in-stent restenosis. PDA had 40% stenosis he had moderate mitral regurgitation and preserved LV function.  The patient has been doing cardiac rehabilitation because of decreased exercise tolerance. He is had recurrent angina just walking into a rehabilitation and is actually sat down the floor before he proceeds to rehabilitation. He does not tell the people at rehabilitation he's been doing this. Last Saturday he awakens in the morning and had a significant episode of upper chest tightness into his neck and shoulders associated with shortness of breath he tried to old nitroglycerin that had no effect and then opened a new nitroglycerin and it eased the pain. Patient says he has poor exercise tolerance and dyspnea on exertion with little activity. His Pravachol was stopped in June to see if it was causing his weakness but the weakness has continued and we have asked him to resume his Pravachol.   No Known Allergies  Current Outpatient Prescriptions on File Prior to Visit  Medication Sig Dispense Refill  . amiodarone (PACERONE) 200 MG tablet Take 200 mg by mouth daily.        [name] amLODipine (NORVASC) 2.5 MG tablet Take 10 mg by mouth daily.        [name] aspirin 81 MG tablet Take 81 mg by mouth daily.        . clonazePAM (KLONOPIN) 1 MG tablet Take 1 mg by mouth at bedtime.        . Coenzyme Q10 (CO Q 10) 100 MG CAPS Take 1 capsule by mouth daily.        [name] levothyroxine (SYNTHROID, LEVOTHROID) 25 MCG tablet Take 0.05 mcg by mouth daily.       . nitroGLYCERIN (NITROSTAT) 0.4 MG SL tablet Place 0.4 mg under the tongue every 5 (five) minutes as needed.        . pantoprazole  (PROTONIX) 40 MG tablet Take 40 mg by mouth 2 (two) times daily.        . Prenatal Vit-Fe Fumarate-FA (M-VIT) tablet Take 1 tablet by mouth daily.        . traZODone (DESYREL) 50 MG tablet Take 50 mg by mouth at bedtime.          Past Medical History  Diagnosis Date  . HYPOTHYROIDISM 05/20/2007  . HYPERLIPIDEMIA 10/20/2008  . DEPRESSION 12/17/2007  . MITRAL REGURGITATION 10/20/2008  . HYPERTENSION 04/10/2007  . CORONARY ARTERY DISEASE 04/10/2007  . Atrial fibrillation 04/12/2007  . GERD 04/10/2007  . DIVERTICULOSIS, COLON 04/10/2007  . BENIGN PROSTATIC HYPERTROPHY 04/10/2007  . GYNECOMASTIA, UNILATERAL 03/23/2010  . SLEEP APNEA 05/20/2007  . WEAKNESS 11/09/2009  . CHEST PAIN 05/11/2009  . Hematoma of abdominal wall   . Clotting disorder     anticoag. therapy    Past Surgical History  Procedure Date  . Transurethral resection of prostate     Family History  Problem Relation Age of Onset  . Hypertension Other   . Heart disease Other     CAD male 1st degree relative    History   Social History  . Marital Status: Married    Spouse Name: N/A    Number of Children: N/A  .  Years of Education: N/A   Occupational History  . Not on file.   Social History Main Topics  . Smoking status: Never Smoker   . Smokeless tobacco: Not on file  . Alcohol Use: No  . Drug Use: Not on file  . Sexually Active: Not on file   Other Topics Concern  . Not on file   Social History Narrative  . No narrative on file    ROS: See HPI Eyes: Glasses Ears:Wears a hearing aid in his right ear Cardiovascular: Negative for palpitations,irregular heartbeat, dyspnea,  near-syncope, orthopnea, paroxysmal nocturnal dyspnia and syncope,edema, claudication, cyanosis,.  Respiratory:   Negative for cough, hemoptysis, shortness of breath, sleep disturbances due to breathing, sputum production and wheezing.   Endocrine: Negative for cold intolerance and heat intolerance.  Hematologic/Lymphatic: Negative for  adenopathy and bleeding problem. Does not bruise/bleed easily.  Musculoskeletal: Negative.   Gastrointestinal: Negative for nausea, vomiting, reflux, abdominal pain, diarrhea, constipation.   Genitourinary: Negative for bladder incontinence, dysuria, flank pain, frequency, hematuria, hesitancy, nocturia and urgency.  Neurological: Negative.  Allergic/Immunologic: Negative for environmental allergies.   PHYSICAL EXAM: Well-nournished, in no acute distress. Neck: No JVD, HJR, Bruit, or thyroid enlargement Lungs: No tachypnea, clear without wheezing, rales, or rhonchi Cardiovascular: RRR, PMI not displaced,2/6 systolic murmur at the left sternal border, no gallops, bruit, thrill, or heave. Abdomen: BS normal. Soft without organomegaly, masses, lesions or tenderness. Extremities: without cyanosis, clubbing or edema. Good distal pulses bilateral SKin: Warm, no lesions or rashes  Musculoskeletal: No deformities Neuro: no focal signs  BP 151/78  Pulse 57  Resp 14  Ht 6' (1.829 m)  Wt 200 lb (90.719 kg)  BMI 27.12 kg/m2  ZOX:WRUEA bradycardia with incomplete right bundle branch block nonspecific ST-T wave changes no acute change

## 2011-03-07 NOTE — Assessment & Plan Note (Addendum)
Patient is maintaining normal sinus rhythm. His primary care doctor feels like the amiodarone may be causing a lot of his weakness. The patient went into atrial fibrillation ON amiodarone was stopped in the past and is not willing to try going off it again.

## 2011-03-07 NOTE — Assessment & Plan Note (Signed)
Patient has had recent increase in his exertional angina. He did have a significant episode of angina on Saturday as described above. I had a long discussion with the patient about possible cardiac catheterization versus trying medication such as Imdur and stress test. Because of his age he would like to try medication prior to agreeing to another cardiac catheterization. I think this is reasonable. We will have him do an adenosine Myoview as well and follow-up with Dr. Antoine Poche

## 2011-03-07 NOTE — Patient Instructions (Signed)
Your physician recommends that you schedule a follow-up appointment in: 2-4 weeks with Dr Antoine Poche  Your physician has recommended you make the following change in your medication: RESTART PRAVASTATIN 40 MG EVERY DAY   AND START IMDUR 30 MG 1 EVERY DAY Your physician has requested that you have an adenosine myoview. For further information please visit [email or web address] Please follow instruction sheet, as given. PT'S CONVENIENCE  DX CHEST PAIN (ANGINA)

## 2011-03-07 NOTE — Assessment & Plan Note (Signed)
Patient has a long history of coronary artery disease with last catheter in 2007 he has had 4 stents over the years please see above dictation for details. Patient is having increased angina. We will add and/or and do an adenosine Myoview.

## 2011-03-08 ENCOUNTER — Encounter (HOSPITAL_COMMUNITY): Payer: Self-pay

## 2011-03-09 ENCOUNTER — Encounter (HOSPITAL_COMMUNITY): Payer: Self-pay

## 2011-03-13 ENCOUNTER — Encounter (HOSPITAL_COMMUNITY): Payer: Medicare Other | Admitting: Radiology

## 2011-03-14 ENCOUNTER — Ambulatory Visit (HOSPITAL_COMMUNITY): Payer: Medicare Other | Attending: Cardiology | Admitting: Radiology

## 2011-03-14 ENCOUNTER — Encounter (HOSPITAL_COMMUNITY): Payer: Self-pay

## 2011-03-14 DIAGNOSIS — I2510 Atherosclerotic heart disease of native coronary artery without angina pectoris: Secondary | ICD-10-CM

## 2011-03-14 DIAGNOSIS — R0602 Shortness of breath: Secondary | ICD-10-CM

## 2011-03-14 DIAGNOSIS — R0989 Other specified symptoms and signs involving the circulatory and respiratory systems: Secondary | ICD-10-CM

## 2011-03-14 DIAGNOSIS — R079 Chest pain, unspecified: Secondary | ICD-10-CM | POA: Insufficient documentation

## 2011-03-14 DIAGNOSIS — R0789 Other chest pain: Secondary | ICD-10-CM

## 2011-03-14 DIAGNOSIS — R0609 Other forms of dyspnea: Secondary | ICD-10-CM

## 2011-03-14 MED ORDER — REGADENOSON 0.4 MG/5ML IV SOLN
0.4000 mg | Freq: Once | INTRAVENOUS | Status: AC
  Administered 2011-03-14: 0.4 mg via INTRAVENOUS

## 2011-03-14 MED ORDER — TECHNETIUM TC 99M TETROFOSMIN IV KIT
33.0000 | PACK | Freq: Once | INTRAVENOUS | Status: AC | PRN
  Administered 2011-03-14: 33 via INTRAVENOUS

## 2011-03-14 MED ORDER — TECHNETIUM TC 99M TETROFOSMIN IV KIT
11.0000 | PACK | Freq: Once | INTRAVENOUS | Status: AC | PRN
  Administered 2011-03-14: 11 via INTRAVENOUS

## 2011-03-14 NOTE — Progress Notes (Signed)
[facility] SITE 3 NUCLEAR MED [address] [place] [address] [phone]  Cardiology Nuclear Med Study  [name] is a 77 y.o. male [id number] March 28, 1927   Nuclear Med Background Indication for Stress Test:  Evaluation for Ischemia History:  Abnormal EKG, '08 Cardioversion:, 05/11Echo: EF 50-55%, '07 Heart Catheterization: mod. cad, '06 Myocardial Infarction, 05/19/09 Myocardial Perfusion Study EF 59% and '06 Stents: RCAx2 Cardiac Risk Factors: Family History - CAD, Hypertension, Lipids, PVD with R popliteal aneurysm repair and IRBBB  Symptoms:  Chest Pain, Chest Tightness, DOE and Fatigue with Exertion   Nuclear Pre-Procedure Caffeine/Decaff Intake:  None NPO After: 7:00pm   Lungs:  clear IV 0.9% NS with Angio Cath:  20g  IV Site: R Wrist  IV Started by:  Cathlyn Parsons, RN  Chest Size (in):  44 Cup Size: n/a  Height: 6' (1.829 m)  Weight:  198 lb (89.812 kg)  BMI:  Body mass index is 26.85 kg/(m^2). Tech Comments:  NA    Nuclear Med Study 1 or 2 day study: 1 day  Stress Test Type:  Eugenie Birks  Reading MD: Cassell Clement, MD  Order Authorizing Provider:  Neva Seat.Hochrein  Resting Radionuclide: Technetium 41m Tetrofosmin  Resting Radionuclide Dose: 11.0 mCi   Stress Radionuclide:  Technetium 40m Tetrofosmin  Stress Radionuclide Dose: 33.0 mCi           Stress Protocol Rest HR: 51 Stress HR: 69  Rest BP: 142/77 Stress BP: 130/72  Exercise Time (min): n/a METS: n/a   Predicted Max HR: 139 bpm % Max HR: 49.64 bpm Rate Pressure Product: 8970   Dose of Adenosine (mg):  n/a Dose of Lexiscan: 0.4 mg  Dose of Atropine (mg): n/a Dose of Dobutamine: n/a mcg/kg/min (at max HR)  Stress Test Technologist: Milana Na, EMT-P  Nuclear Technologist:  Doyne Keel, CNMT     Rest Procedure:  Myocardial perfusion imaging was performed at rest 45 minutes following the intravenous administration of Technetium 30m Tetrofosmin. Rest ECG:  Sinus Bradycardia  Stress Procedure:  The patient received IV Lexiscan 0.4 mg over 15-seconds.  Technetium 72m Tetrofosmin injected at 30-seconds.  There were no significant changes with Lexiscan.  Quantitative spect images were obtained after a 45 minute delay. Stress ECG: No significant change from baseline ECG  QPS Raw Data Images:  Normal; no motion artifact; normal heart/lung ratio. Stress Images:  Normal homogeneous uptake in all areas of the myocardium. Rest Images:  Normal homogeneous uptake in all areas of the myocardium. Subtraction (SDS):  No evidence of ischemia. Transient Ischemic Dilatation (Normal <1.22):  0.99 Lung/Heart Ratio (Normal <0.45):  0.32  Quantitative Gated Spect Images QGS EDV:  134 ml QGS ESV:  48 ml QGS cine images:  NL LV Function; NL Wall Motion QGS EF: 64%  Impression Exercise Capacity:  Lexiscan with no exercise. BP Response:  Normal blood pressure response. Clinical Symptoms:  No chest pain. ECG Impression:  No significant ST segment change suggestive of ischemia. Comparison with Prior Nuclear Study: No significant change from previous study  Overall Impression:  Normal stress nuclear study.  Good LV systolic function.  No ischemia.   Cassell Clement

## 2011-03-15 ENCOUNTER — Encounter (HOSPITAL_COMMUNITY): Payer: Self-pay

## 2011-03-16 ENCOUNTER — Telehealth: Payer: Self-pay | Admitting: Cardiology

## 2011-03-16 ENCOUNTER — Encounter (HOSPITAL_COMMUNITY): Payer: Self-pay

## 2011-03-16 NOTE — Telephone Encounter (Signed)
Pt's wife aware of results of nuclear study.  She states that pt has extreme fatigue and it is getting worse.   Pt given an appointment for evaluation with Dr Antoine Poche for 03/17/2011 at 10:30 am.  Wife was most grateful.

## 2011-03-16 NOTE — Telephone Encounter (Signed)
Test results

## 2011-03-17 ENCOUNTER — Encounter: Payer: Self-pay | Admitting: Cardiology

## 2011-03-17 ENCOUNTER — Ambulatory Visit (INDEPENDENT_AMBULATORY_CARE_PROVIDER_SITE_OTHER): Payer: Medicare Other | Admitting: Cardiology

## 2011-03-17 VITALS — BP 121/69 | HR 60 | Ht 72.0 in | Wt 194.4 lb

## 2011-03-17 DIAGNOSIS — I080 Rheumatic disorders of both mitral and aortic valves: Secondary | ICD-10-CM

## 2011-03-17 DIAGNOSIS — R0602 Shortness of breath: Secondary | ICD-10-CM

## 2011-03-17 DIAGNOSIS — I2510 Atherosclerotic heart disease of native coronary artery without angina pectoris: Secondary | ICD-10-CM

## 2011-03-17 NOTE — Assessment & Plan Note (Signed)
He will continue with risk reduction. 

## 2011-03-17 NOTE — Patient Instructions (Signed)
You are being scheduled for pulmonary function test.  Please follow up with Dr Antoine Poche after the testing.  The current medical regimen is effective;  continue present plan and medications.

## 2011-03-17 NOTE — Assessment & Plan Note (Signed)
We discussed this at length. I cannot exclude the possibility of amiodarone cause. He would need to stop this medicine for several weeks to see if he felt better off of this. He does not want to do this. To evaluate the dyspnea I will check the liver function tests. I did review labs and tests normal TSH. There is no evidence of obstructive coronary disease. I will proceed based on the results of the pulmonary testing.

## 2011-03-17 NOTE — Assessment & Plan Note (Signed)
I will likely repeat an echocardiogram in Next year as well the 2 years since her last one. His MR was mild at the last evaluation.

## 2011-03-17 NOTE — Progress Notes (Signed)
HPI Patient presents for followup of his known cardiovascular problems.  Since I last saw him he has had chest pain and a stress perfusion study.  This demonstrated immediate were present with no ischemia or infarct. Unfortunately he still feels poorly. He continues to have decreased energy. He has dyspnea insertion. He has not had any new chest discomfort.  He has no new palpitations, presyncope or syncope.  No Known Allergies  Current Outpatient Prescriptions  Medication Sig Dispense Refill  . amiodarone (PACERONE) 200 MG tablet Take 200 mg by mouth daily.        [name] amLODipine (NORVASC) 2.5 MG tablet Take 10 mg by mouth daily.        [name] aspirin 81 MG tablet Take 81 mg by mouth daily.        . clonazePAM (KLONOPIN) 1 MG tablet Take 1 mg by mouth at bedtime.        . Coenzyme Q10 (CO Q 10) 100 MG CAPS Take 1 capsule by mouth daily.        . isosorbide mononitrate (IMDUR) 30 MG 24 hr tablet Take 1 tablet (30 mg total) by mouth daily.  30 tablet  11  . levothyroxine (SYNTHROID, LEVOTHROID) 25 MCG tablet Take 0.05 mcg by mouth daily.       . nitroGLYCERIN (NITROSTAT) 0.4 MG SL tablet Place 0.4 mg under the tongue every 5 (five) minutes as needed.        . pantoprazole (PROTONIX) 40 MG tablet Take 40 mg by mouth 2 (two) times daily.        . pravastatin (PRAVACHOL) 40 MG tablet Take 1 tablet (40 mg total) by mouth every evening.      . Prenatal Vit-Fe Fumarate-FA (M-VIT) tablet Take 1 tablet by mouth daily.        . traZODone (DESYREL) 50 MG tablet Take 50 mg by mouth at bedtime.          Past Medical History  Diagnosis Date  . HYPOTHYROIDISM 05/20/2007  . HYPERLIPIDEMIA 10/20/2008  . DEPRESSION 12/17/2007  . MITRAL REGURGITATION 10/20/2008  . HYPERTENSION 04/10/2007  . CORONARY ARTERY DISEASE 04/10/2007  . Atrial fibrillation 04/12/2007  . GERD 04/10/2007  . DIVERTICULOSIS, COLON 04/10/2007  . BENIGN PROSTATIC HYPERTROPHY 04/10/2007  . GYNECOMASTIA, UNILATERAL 03/23/2010  . SLEEP APNEA 05/20/2007  .  WEAKNESS 11/09/2009  . CHEST PAIN 05/11/2009  . Hematoma of abdominal wall   . Clotting disorder     anticoag. therapy    Past Surgical History  Procedure Date  . Transurethral resection of prostate     ROS:  Very fatigued.  Otherwise as stated in the HPI and negative for all other systems.  PHYSICAL EXAM BP 121/69  Pulse 60  Ht 6' (1.829 m)  Wt 194 lb 6.4 oz (88.179 kg)  BMI 26.37 kg/m2 GENERAL:  Well appearing HEENT:  Pupils equal round and reactive, fundi not visualized, oral mucosa unremarkable NECK:  No jugular venous distention, waveform within normal limits, carotid upstroke brisk and symmetric, no bruits, no thyromegaly LYMPHATICS:  No cervical, inguinal adenopathy LUNGS:  Clear to auscultation bilaterally BACK:  No CVA tenderness CHEST:  Unremarkable HEART:  PMI not displaced or sustained,S1 and S2 within normal limits, no S3, no S4, no clicks, no rubs, no murmurs ABD:  Flat, positive bowel sounds normal in frequency in pitch, no bruits, no rebound, no guarding, no midline pulsatile mass, no hepatomegaly, no splenomegaly EXT:  2 plus pulses throughout, no edema, no cyanosis no clubbing  SKIN:  No rashes no nodules NEURO:  Cranial nerves II through XII grossly intact, motor grossly intact throughout PSYCH:  Cognitively intact, oriented to person place and time   ASSESSMENT AND PLAN

## 2011-03-29 ENCOUNTER — Ambulatory Visit (INDEPENDENT_AMBULATORY_CARE_PROVIDER_SITE_OTHER): Payer: Medicare Other | Admitting: Internal Medicine

## 2011-03-29 DIAGNOSIS — I4891 Unspecified atrial fibrillation: Secondary | ICD-10-CM

## 2011-03-29 LAB — PULMONARY FUNCTION TEST

## 2011-03-29 NOTE — Progress Notes (Signed)
PFT done today. 

## 2011-03-30 ENCOUNTER — Encounter: Payer: Self-pay | Admitting: Internal Medicine

## 2011-03-30 ENCOUNTER — Ambulatory Visit (INDEPENDENT_AMBULATORY_CARE_PROVIDER_SITE_OTHER): Payer: Medicare Other | Admitting: Internal Medicine

## 2011-03-30 DIAGNOSIS — I4891 Unspecified atrial fibrillation: Secondary | ICD-10-CM

## 2011-03-30 DIAGNOSIS — R5381 Other malaise: Secondary | ICD-10-CM

## 2011-03-30 DIAGNOSIS — J069 Acute upper respiratory infection, unspecified: Secondary | ICD-10-CM

## 2011-03-30 DIAGNOSIS — R5383 Other fatigue: Secondary | ICD-10-CM

## 2011-03-30 DIAGNOSIS — I10 Essential (primary) hypertension: Secondary | ICD-10-CM

## 2011-03-30 NOTE — Progress Notes (Signed)
  Subjective:    Patient ID: [name], male    DOB: Dec 10, 1926, 77 y.o.   MRN: [id number]  HPI  an 77 year old patient who has a history of atrial fibrillation and chronic fatigue. He was stable until 2 days ago he developed sore throat yesterday he had the onset of productive cough and large volume rhinorrhea. He states that he has a difficult time with CPAP at night due to the profuse drainage. There's been no fever. His sore throat has improved. Recently he has had both a nuclear medicine stress test and yesterday had pulmonary function studies. He is also followed at the [facility]. Denies any chest pain shortness of breath or chills    Review of Systems  Constitutional: Positive for fatigue. Negative for fever, chills and appetite change.  HENT: Positive for congestion, rhinorrhea and postnasal drip. Negative for hearing loss, ear pain, sore throat, trouble swallowing, neck stiffness, dental problem, voice change and tinnitus.   Eyes: Negative for pain, discharge and visual disturbance.  Respiratory: Positive for cough. Negative for chest tightness, wheezing and stridor.   Cardiovascular: Negative for chest pain, palpitations and leg swelling.  Gastrointestinal: Negative for nausea, vomiting, abdominal pain, diarrhea, constipation, blood in stool and abdominal distention.  Genitourinary: Negative for urgency, hematuria, flank pain, discharge, difficulty urinating and genital sores.  Musculoskeletal: Negative for myalgias, back pain, joint swelling, arthralgias and gait problem.  Skin: Negative for rash.  Neurological: Negative for dizziness, syncope, speech difficulty, weakness, numbness and headaches.  Hematological: Negative for adenopathy. Does not bruise/bleed easily.  Psychiatric/Behavioral: Negative for behavioral problems and dysphoric mood. The patient is not nervous/anxious.        Objective:   Physical Exam  Constitutional: He is oriented to person, place, and time. He  appears well-developed.  HENT:  Head: Normocephalic.  Right Ear: External ear normal.  Left Ear: External ear normal.  Eyes: Conjunctivae and EOM are normal.  Neck: Normal range of motion.  Cardiovascular: Normal rate and normal heart sounds.   Pulmonary/Chest: Effort normal and breath sounds normal. No respiratory distress. He has no wheezes.       O2 saturation 98 Pulse rate 64  Abdominal: Bowel sounds are normal.  Musculoskeletal: Normal range of motion. He exhibits no edema and no tenderness.  Neurological: He is alert and oriented to person, place, and time.  Psychiatric: He has a normal mood and affect. His behavior is normal.          Assessment & Plan:  Viral URI. Patient will return if symptomatically with Tylenol and Mucinex DM. He will use Alavert to control excessive secretions. He'll call if he develops fever or any shortness of breath Chronic fatigue. Will review results of neck or medicine stress test and PFTs Atrial fibrillation. Stable presently normal sinus rhythm Hypertension. Well controlled

## 2011-03-30 NOTE — Patient Instructions (Signed)
Get plenty of rest, Drink lots of  clear liquids, and use Tylenol or ibuprofen for fever and discomfort.    Mucinex DM. Take one twice daily  Alavert one daily to control secretions (or  Allegra Zyrtec Claritin)  Call or return to clinic prn if these symptoms worsen or fail to improve as anticipated.

## 2011-03-31 ENCOUNTER — Ambulatory Visit: Payer: Medicare Other | Admitting: Cardiology

## 2011-04-12 ENCOUNTER — Ambulatory Visit (INDEPENDENT_AMBULATORY_CARE_PROVIDER_SITE_OTHER): Payer: Medicare Other | Admitting: Cardiology

## 2011-04-12 ENCOUNTER — Encounter: Payer: Self-pay | Admitting: Cardiology

## 2011-04-12 DIAGNOSIS — I4891 Unspecified atrial fibrillation: Secondary | ICD-10-CM

## 2011-04-12 DIAGNOSIS — R5381 Other malaise: Secondary | ICD-10-CM

## 2011-04-12 DIAGNOSIS — I2510 Atherosclerotic heart disease of native coronary artery without angina pectoris: Secondary | ICD-10-CM

## 2011-04-12 DIAGNOSIS — I10 Essential (primary) hypertension: Secondary | ICD-10-CM

## 2011-04-12 DIAGNOSIS — I080 Rheumatic disorders of both mitral and aortic valves: Secondary | ICD-10-CM

## 2011-04-12 LAB — POCT CARDIAC MARKERS
Myoglobin, poc: 58.8
Operator id: 294501

## 2011-04-12 LAB — CBC
Hemoglobin: 13.2
RBC: 4.2 — ABNORMAL LOW
WBC: 6.2

## 2011-04-12 LAB — CARDIAC PANEL(CRET KIN+CKTOT+MB+TROPI)
CK, MB: 1.2
Total CK: 63
Troponin I: 0.01
Troponin I: 0.02

## 2011-04-12 LAB — TROPONIN I: Troponin I: 0.01

## 2011-04-12 LAB — CK TOTAL AND CKMB (NOT AT ARMC)
Relative Index: INVALID
Total CK: 94

## 2011-04-12 LAB — DIFFERENTIAL
Basophils Relative: 0
Lymphs Abs: 1.3
Monocytes Relative: 10
Neutro Abs: 4.1
Neutrophils Relative %: 67

## 2011-04-12 LAB — COMPREHENSIVE METABOLIC PANEL
BUN: 17
Calcium: 8.8
Creatinine, Ser: 1.14
Glucose, Bld: 95
Total Protein: 5.9 — ABNORMAL LOW

## 2011-04-12 LAB — PROTIME-INR
INR: 2.1 — ABNORMAL HIGH
Prothrombin Time: 24.2 — ABNORMAL HIGH

## 2011-04-12 LAB — POCT I-STAT, CHEM 8
BUN: 21
Chloride: 105
Sodium: 139
TCO2: 28

## 2011-04-12 NOTE — Assessment & Plan Note (Signed)
I will order an echocardiogram to follow up on this in December two years after the last one.

## 2011-04-12 NOTE — Progress Notes (Signed)
HPI Patient presents for followup of his known cardiovascular problems.  Since I last saw him he had PFTs to evaluate dyspnea.  These demonstrated no significant pathology.  However, he continues to have significant fatigue. He has some chest discomfort chronically but he's not having any new chest pressure, neck or arm discomfort. He's not had any new palpitations, presyncope or syncope. He has had no new weight gain or edema. He has had some blurred vision. He continues to walk slowly with minimal activities.  No Known Allergies  Current Outpatient Prescriptions  Medication Sig Dispense Refill  . amiodarone (PACERONE) 200 MG tablet Take 200 mg by mouth daily.        [name] amLODipine (NORVASC) 2.5 MG tablet Take 10 mg by mouth daily.        [name] aspirin 81 MG tablet Take 81 mg by mouth daily.        . clonazePAM (KLONOPIN) 1 MG tablet Take 1 mg by mouth at bedtime.        . Coenzyme Q10 (CO Q 10) 100 MG CAPS Take 1 capsule by mouth daily.        [name] levothyroxine (SYNTHROID, LEVOTHROID) 25 MCG tablet Take 25 mcg by mouth daily.       . nitroGLYCERIN (NITROSTAT) 0.4 MG SL tablet Place 0.4 mg under the tongue every 5 (five) minutes as needed.        . pantoprazole (PROTONIX) 40 MG tablet Take 40 mg by mouth 2 (two) times daily.        . pravastatin (PRAVACHOL) 40 MG tablet Take 1 tablet (40 mg total) by mouth every evening.      . Prenatal Vit-Fe Fumarate-FA (M-VIT) tablet Take 1 tablet by mouth daily.        . traZODone (DESYREL) 50 MG tablet Take 50 mg by mouth at bedtime.          Past Medical History  Diagnosis Date  . HYPOTHYROIDISM 05/20/2007  . HYPERLIPIDEMIA 10/20/2008  . DEPRESSION 12/17/2007  . MITRAL REGURGITATION 10/20/2008  . HYPERTENSION 04/10/2007  . CORONARY ARTERY DISEASE 04/10/2007  . Atrial fibrillation 04/12/2007  . GERD 04/10/2007  . DIVERTICULOSIS, COLON 04/10/2007  . BENIGN PROSTATIC HYPERTROPHY 04/10/2007  . GYNECOMASTIA, UNILATERAL 03/23/2010  . SLEEP APNEA 05/20/2007  . WEAKNESS  11/09/2009  . CHEST PAIN 05/11/2009  . Hematoma of abdominal wall   . Clotting disorder     anticoag. therapy    Past Surgical History  Procedure Date  . Transurethral resection of prostate     ROS:  Very fatigued.  Otherwise as stated in the HPI and negative for all other systems.  PHYSICAL EXAM BP 122/68  Pulse 55  Resp 18  Ht 6' (1.829 m)  Wt 197 lb (89.359 kg)  BMI 26.72 kg/m2 GENERAL:  Sad affect HEENT:  Pupils equal round and reactive, fundi not visualized, oral mucosa unremarkable NECK:  No jugular venous distention, waveform within normal limits, carotid upstroke brisk and symmetric, no bruits, no thyromegaly LYMPHATICS:  No cervical, inguinal adenopathy LUNGS:  Clear to auscultation bilaterally BACK:  No CVA tenderness CHEST:  Unremarkable HEART:  PMI not displaced or sustained,S1 and S2 within normal limits, no S3, no S4, no clicks, no rubs, no murmurs ABD:  Flat, positive bowel sounds normal in frequency in pitch, no bruits, no rebound, no guarding, no midline pulsatile mass, no hepatomegaly, no splenomegaly EXT:  2 plus pulses throughout, no edema, no cyanosis no clubbing SKIN:  No rashes no nodules  NEURO:  Cranial nerves II through XII grossly intact, motor grossly intact throughout PSYCH:  Cognitively intact, oriented to person place and time   ASSESSMENT AND PLAN

## 2011-04-12 NOTE — Patient Instructions (Signed)
Please stop your Amiodarone.  Continue all other medications as listed.  Your physician has requested that you have an echocardiogram. Echocardiography is a painless test that uses sound waves to create images of your heart. It provides your doctor with information about the size and shape of your heart and how well your heart's chambers and valves are working. This procedure takes approximately one hour. There are no restrictions for this procedure.  Follow up with Dr Antoine Poche in January 2013

## 2011-04-12 NOTE — Assessment & Plan Note (Signed)
At this point I cannot exclude amiodarone causing his weakness. Therefore, after much discussion, we have decided to stop this medication. If he has further arrhythmias we will make further decisions about the need for treatment.

## 2011-04-12 NOTE — Assessment & Plan Note (Signed)
The blood pressure is at target. No change in medications is indicated. We will continue with therapeutic lifestyle changes (TLC).  

## 2011-04-12 NOTE — Assessment & Plan Note (Signed)
He had a negative stress perfusion study earlier this year.  No change in therapy is indicated.

## 2011-04-21 ENCOUNTER — Other Ambulatory Visit (HOSPITAL_COMMUNITY): Payer: Medicare Other

## 2011-04-28 ENCOUNTER — Ambulatory Visit (HOSPITAL_COMMUNITY): Payer: Medicare Other | Attending: Cardiology | Admitting: Radiology

## 2011-04-28 DIAGNOSIS — I10 Essential (primary) hypertension: Secondary | ICD-10-CM | POA: Insufficient documentation

## 2011-04-28 DIAGNOSIS — I080 Rheumatic disorders of both mitral and aortic valves: Secondary | ICD-10-CM

## 2011-04-28 DIAGNOSIS — I059 Rheumatic mitral valve disease, unspecified: Secondary | ICD-10-CM | POA: Insufficient documentation

## 2011-04-28 DIAGNOSIS — R5381 Other malaise: Secondary | ICD-10-CM | POA: Insufficient documentation

## 2011-04-28 DIAGNOSIS — R5383 Other fatigue: Secondary | ICD-10-CM | POA: Insufficient documentation

## 2011-04-28 DIAGNOSIS — I2510 Atherosclerotic heart disease of native coronary artery without angina pectoris: Secondary | ICD-10-CM | POA: Insufficient documentation

## 2011-04-28 DIAGNOSIS — E785 Hyperlipidemia, unspecified: Secondary | ICD-10-CM | POA: Insufficient documentation

## 2011-04-28 LAB — COMPREHENSIVE METABOLIC PANEL
ALT: 18
AST: 27
Albumin: 3.6
Alkaline Phosphatase: 74
Chloride: 107
GFR calc Af Amer: >60
Potassium: 3.9
Sodium: 138
Total Bilirubin: 0.7

## 2011-04-28 LAB — URINALYSIS, ROUTINE W REFLEX MICROSCOPIC
Bilirubin Urine: NEGATIVE
Hgb urine dipstick: NEGATIVE
Ketones, ur: NEGATIVE
Nitrite: NEGATIVE
Urobilinogen, UA: 1
pH: 7.5

## 2011-04-28 LAB — LIPID PANEL
LDL Cholesterol: 55
Total CHOL/HDL Ratio: 2.4
Triglycerides: 61
VLDL: 12

## 2011-04-28 LAB — PROTIME-INR
Prothrombin Time: 25.2 — ABNORMAL HIGH
Prothrombin Time: 26.6 — ABNORMAL HIGH

## 2011-04-28 LAB — CARDIAC PANEL(CRET KIN+CKTOT+MB+TROPI)
CK, MB: 1.2
Relative Index: INVALID
Relative Index: INVALID
Total CK: 34
Total CK: 46
Troponin I: 0.03

## 2011-04-28 LAB — CBC
HCT: 41.7
HCT: 43.8
Hemoglobin: 14.9
MCHC: 34
Platelets: 183
Platelets: 185
RDW: 13.5
WBC: 5.9

## 2011-04-28 LAB — POCT CARDIAC MARKERS
CKMB, poc: <1 — ABNORMAL LOW
Operator id: 285841
Troponin i, poc: <0.05

## 2011-04-28 LAB — DIFFERENTIAL
Basophils Absolute: 0
Basophils Relative: 0
Eosinophils Relative: 2
Monocytes Absolute: 0.6
Monocytes Relative: 10

## 2011-04-28 LAB — BASIC METABOLIC PANEL
BUN: 17
CO2: 24
GFR calc non Af Amer: >60
Glucose, Bld: 90
Potassium: 3.9
Sodium: 142

## 2011-04-28 LAB — CK TOTAL AND CKMB (NOT AT ARMC): CK, MB: 1.1

## 2011-04-28 LAB — APTT: aPTT: 45 — ABNORMAL HIGH

## 2011-04-28 LAB — TROPONIN I: Troponin I: 0.02

## 2011-05-01 LAB — CARDIAC PANEL(CRET KIN+CKTOT+MB+TROPI)
CK, MB: 0.9
Relative Index: INVALID
Total CK: 49
Troponin I: 0.02
Troponin I: 0.03

## 2011-05-01 LAB — PROTIME-INR
INR: 1.4
INR: 1.5
INR: 1.6 — ABNORMAL HIGH
INR: 1.7 — ABNORMAL HIGH
INR: 2.3 — ABNORMAL HIGH
INR: 2.7 — ABNORMAL HIGH
INR: 2.8 — ABNORMAL HIGH
Prothrombin Time: 17.5 — ABNORMAL HIGH
Prothrombin Time: 18.2 — ABNORMAL HIGH
Prothrombin Time: 20.1 — ABNORMAL HIGH
Prothrombin Time: 20.4 — ABNORMAL HIGH
Prothrombin Time: 26.3 — ABNORMAL HIGH
Prothrombin Time: 29.6 — ABNORMAL HIGH
Prothrombin Time: 29.8 — ABNORMAL HIGH
Prothrombin Time: 30.8 — ABNORMAL HIGH

## 2011-05-01 LAB — BASIC METABOLIC PANEL
BUN: 13
BUN: 13
BUN: 15
BUN: 17
CO2: 25
Calcium: 8.2 — ABNORMAL LOW
Calcium: 8.7
Calcium: 8.8
Calcium: 8.8
Calcium: 8.9
Chloride: 109
Creatinine, Ser: 1.24
Creatinine, Ser: 1.3
Creatinine, Ser: 1.38
GFR calc Af Amer: >60
GFR calc Af Amer: >60
GFR calc non Af Amer: 50 — ABNORMAL LOW
GFR calc non Af Amer: 54 — ABNORMAL LOW
GFR calc non Af Amer: 57 — ABNORMAL LOW
GFR calc non Af Amer: 57 — ABNORMAL LOW
GFR calc non Af Amer: 60 — ABNORMAL LOW
GFR calc non Af Amer: >60
Glucose, Bld: 121 — ABNORMAL HIGH
Glucose, Bld: 93
Glucose, Bld: 99
Potassium: 3.7
Potassium: 3.9
Sodium: 135
Sodium: 136
Sodium: 140

## 2011-05-01 LAB — CBC
HCT: 36.5 — ABNORMAL LOW
HCT: 37.8 — ABNORMAL LOW
HCT: 41.1
Hemoglobin: 12 — ABNORMAL LOW
Hemoglobin: 13.8
MCHC: 33.6
Platelets: 121 — ABNORMAL LOW
Platelets: 128 — ABNORMAL LOW
Platelets: 133 — ABNORMAL LOW
Platelets: 135 — ABNORMAL LOW
RBC: 3.78 — ABNORMAL LOW
RBC: 4.32
RDW: 13.7
RDW: 13.8
RDW: 14
RDW: 13.7
WBC: 5.8
WBC: 6.3
WBC: 6.3
WBC: 7

## 2011-05-01 LAB — HEPARIN LEVEL (UNFRACTIONATED)
Heparin Unfractionated: 0.28 — ABNORMAL LOW
Heparin Unfractionated: 0.59
Heparin Unfractionated: 0.67
Heparin Unfractionated: 0.77 — ABNORMAL HIGH
Heparin Unfractionated: 0.95 — ABNORMAL HIGH

## 2011-05-01 LAB — COMPREHENSIVE METABOLIC PANEL
Alkaline Phosphatase: 76
BUN: 14
CO2: 24
Calcium: 8.9
GFR calc non Af Amer: >60
Glucose, Bld: 118 — ABNORMAL HIGH
Total Protein: 6.8

## 2011-05-01 LAB — B-NATRIURETIC PEPTIDE (CONVERTED LAB)
Pro B Natriuretic peptide (BNP): 319 — ABNORMAL HIGH
Pro B Natriuretic peptide (BNP): 362 — ABNORMAL HIGH

## 2011-05-01 LAB — TSH: TSH: 3.829

## 2011-05-01 LAB — MAGNESIUM: Magnesium: 2

## 2011-05-02 LAB — I-STAT 8, (EC8 V) (CONVERTED LAB)
Acid-base deficit: 1
Bicarbonate: 24.8 — ABNORMAL HIGH
Glucose, Bld: 89
Hemoglobin: 13.6
Operator id: 198171
Potassium: 4.2
Sodium: 140
TCO2: 26

## 2011-05-02 LAB — PROTIME-INR
INR: 1.1
INR: 1.2
INR: 1.4
Prothrombin Time: 15.4 — ABNORMAL HIGH
Prothrombin Time: 17.3 — ABNORMAL HIGH
Prothrombin Time: 24.6 — ABNORMAL HIGH

## 2011-05-02 LAB — CBC
HCT: 34.3 — ABNORMAL LOW
HCT: 36.1 — ABNORMAL LOW
HCT: 39.1
Hemoglobin: 11.6 — ABNORMAL LOW
Hemoglobin: 12.1 — ABNORMAL LOW
Hemoglobin: 12.7 — ABNORMAL LOW
Hemoglobin: 12.9 — ABNORMAL LOW
MCHC: 33.1
MCHC: 33.7
MCHC: 33.7
MCHC: 33.9
MCV: 94
MCV: 95.7
MCV: 95.8
MCV: 96.2
Platelets: 136 — ABNORMAL LOW
Platelets: 137 — ABNORMAL LOW
Platelets: 156
RBC: 3.65 — ABNORMAL LOW
RBC: 3.77 — ABNORMAL LOW
RBC: 3.91 — ABNORMAL LOW
RBC: 4.07 — ABNORMAL LOW
RDW: 13.7
RDW: 14.1 — ABNORMAL HIGH
RDW: 14.2 — ABNORMAL HIGH
WBC: 6.3
WBC: 6.3
WBC: 6.6
WBC: 7.5

## 2011-05-02 LAB — CARDIAC PANEL(CRET KIN+CKTOT+MB+TROPI)
CK, MB: 0.8
CK, MB: 1.1
Relative Index: INVALID
Relative Index: INVALID
Total CK: 35
Total CK: 40
Troponin I: 0.01
Troponin I: <0.01

## 2011-05-02 LAB — HEPARIN LEVEL (UNFRACTIONATED)
Heparin Unfractionated: 0.18 — ABNORMAL LOW
Heparin Unfractionated: 0.48
Heparin Unfractionated: 0.53
Heparin Unfractionated: 0.6
Heparin Unfractionated: 0.64

## 2011-05-02 LAB — COMPREHENSIVE METABOLIC PANEL
Alkaline Phosphatase: 71
BUN: 16
Creatinine, Ser: 1.16
Glucose, Bld: 90
Potassium: 4.8
Total Bilirubin: 0.9
Total Protein: 6.3

## 2011-05-02 LAB — DIFFERENTIAL
Basophils Relative: 0
Eosinophils Absolute: 0.1
Eosinophils Relative: 2
Lymphs Abs: 1.4
Monocytes Absolute: 0.7
Monocytes Relative: 10
Neutrophils Relative %: 66

## 2011-05-02 LAB — POCT I-STAT CREATININE
Creatinine, Ser: 1.3
Operator id: 198171

## 2011-05-02 LAB — LIPID PANEL: VLDL: 10

## 2011-05-02 LAB — BASIC METABOLIC PANEL WITH GFR
BUN: 14
CO2: 25
Calcium: 8.5
Chloride: 110
Creatinine, Ser: 1.2
GFR calc Af Amer: >60
GFR calc non Af Amer: 59 — ABNORMAL LOW
Glucose, Bld: 108 — ABNORMAL HIGH
Potassium: 3.9
Sodium: 139

## 2011-05-02 LAB — BASIC METABOLIC PANEL
CO2: 25
Chloride: 109
Creatinine, Ser: 1.27
GFR calc Af Amer: >60
Potassium: 3.8
Sodium: 138

## 2011-05-02 LAB — TSH: TSH: 2.855

## 2011-05-02 LAB — TROPONIN I: Troponin I: 0.01

## 2011-05-02 LAB — CK TOTAL AND CKMB (NOT AT ARMC)
CK, MB: 1.3
Relative Index: INVALID

## 2011-05-02 LAB — POCT CARDIAC MARKERS
Operator id: 198171
Troponin i, poc: <0.05

## 2011-05-19 ENCOUNTER — Ambulatory Visit: Payer: Medicare Other | Admitting: Internal Medicine

## 2011-05-25 ENCOUNTER — Ambulatory Visit (INDEPENDENT_AMBULATORY_CARE_PROVIDER_SITE_OTHER): Payer: Medicare Other | Admitting: Internal Medicine

## 2011-05-25 ENCOUNTER — Encounter: Payer: Self-pay | Admitting: Internal Medicine

## 2011-05-25 DIAGNOSIS — G4730 Sleep apnea, unspecified: Secondary | ICD-10-CM

## 2011-05-25 DIAGNOSIS — E785 Hyperlipidemia, unspecified: Secondary | ICD-10-CM

## 2011-05-25 DIAGNOSIS — F329 Major depressive disorder, single episode, unspecified: Secondary | ICD-10-CM

## 2011-05-25 DIAGNOSIS — I4891 Unspecified atrial fibrillation: Secondary | ICD-10-CM

## 2011-05-25 DIAGNOSIS — I10 Essential (primary) hypertension: Secondary | ICD-10-CM

## 2011-05-25 DIAGNOSIS — I2510 Atherosclerotic heart disease of native coronary artery without angina pectoris: Secondary | ICD-10-CM

## 2011-05-25 NOTE — Progress Notes (Signed)
  Subjective:    Patient ID: [name], male    DOB: 20-Dec-1926, 77 y.o.   MRN: [id number]  HPI  77-year-old patient who is seen today for followup. He is followed closely by cardiology for coronary artery disease and paroxysmal atrial fibrillation. He is now off amiodarone therapy. He has a history of depression. Complaints today include the difficulties with sleep he does have obstructive sleep apnea and uses a CPAP machine that that is a factor. In general doing quite well no active cardiopulmonary complaints. He did work all day earlier this week at [employer] on election day. He is also followed at the [facility] and his Synthroid dose adjusted slightly due to a mildly elevated TSH    Review of Systems  Constitutional: Positive for fatigue. Negative for fever, chills and appetite change.  HENT: Negative for hearing loss, ear pain, congestion, sore throat, trouble swallowing, neck stiffness, dental problem, voice change and tinnitus.   Eyes: Negative for pain, discharge and visual disturbance.  Respiratory: Negative for cough, chest tightness, wheezing and stridor.   Cardiovascular: Negative for chest pain, palpitations and leg swelling.  Gastrointestinal: Negative for nausea, vomiting, abdominal pain, diarrhea, constipation, blood in stool and abdominal distention.  Genitourinary: Negative for urgency, hematuria, flank pain, discharge, difficulty urinating and genital sores.  Musculoskeletal: Negative for myalgias, back pain, joint swelling, arthralgias and gait problem.  Skin: Negative for rash.  Neurological: Positive for weakness. Negative for dizziness, syncope, speech difficulty, numbness and headaches.  Hematological: Negative for adenopathy. Does not bruise/bleed easily.  Psychiatric/Behavioral: Positive for sleep disturbance and dysphoric mood. Negative for behavioral problems. The patient is nervous/anxious.        Objective:   Physical Exam  Constitutional: He is oriented to  person, place, and time. He appears well-developed.       Blood pressure 120/82  HENT:  Head: Normocephalic.  Right Ear: External ear normal.  Left Ear: External ear normal.  Eyes: Conjunctivae and EOM are normal.  Neck: Normal range of motion.  Cardiovascular: Normal rate and normal heart sounds.   Pulmonary/Chest: Breath sounds normal.  Abdominal: Bowel sounds are normal.  Musculoskeletal: Normal range of motion. He exhibits no edema and no tenderness.  Neurological: He is alert and oriented to person, place, and time.  Psychiatric: He has a normal mood and affect. His behavior is normal.          Assessment & Plan:   Hypertension stable Obstructive sleep apnea Coronary artery disease. Asymptomatic Dyslipidemia.  We'll continue present regimen. Cardiology followup as scheduled Will recheck here for an annual physical in 6 months

## 2011-05-25 NOTE — Patient Instructions (Signed)
Limit your sodium (Salt) intake    It is important that you exercise regularly, at least 20 minutes 3 to 4 times per week.  If you develop chest pain or shortness of breath seek  medical attention.  Return in 6 months for follow-up  

## 2011-06-15 NOTE — Telephone Encounter (Signed)
Please close encounter

## 2011-08-10 ENCOUNTER — Ambulatory Visit (INDEPENDENT_AMBULATORY_CARE_PROVIDER_SITE_OTHER): Payer: Medicare Other | Admitting: Cardiology

## 2011-08-10 ENCOUNTER — Encounter: Payer: Self-pay | Admitting: Cardiology

## 2011-08-10 DIAGNOSIS — I2510 Atherosclerotic heart disease of native coronary artery without angina pectoris: Secondary | ICD-10-CM | POA: Diagnosis not present

## 2011-08-10 DIAGNOSIS — I4891 Unspecified atrial fibrillation: Secondary | ICD-10-CM | POA: Diagnosis not present

## 2011-08-10 DIAGNOSIS — I080 Rheumatic disorders of both mitral and aortic valves: Secondary | ICD-10-CM

## 2011-08-10 DIAGNOSIS — I10 Essential (primary) hypertension: Secondary | ICD-10-CM | POA: Diagnosis not present

## 2011-08-10 NOTE — Assessment & Plan Note (Signed)
He has had no recurrence of this since stopping amiodarone secondary to weakness and fatigue. He's not on anticoagulation with GI bleeding. No change in therapy is indicated.

## 2011-08-10 NOTE — Assessment & Plan Note (Signed)
Echo in 2012 demonstrated only mild mitral regurgitation and a well preserved ejection fraction. No change in therapy is indicated.

## 2011-08-10 NOTE — Patient Instructions (Signed)
The current medical regimen is effective;  continue present plan and medications.  Follow up in 1 year with Dr Hochrein.  You will receive a letter in the mail 2 months before you are due.  Please call us when you receive this letter to schedule your follow up appointment.  

## 2011-08-10 NOTE — Assessment & Plan Note (Signed)
He had stress testing last year. No further changes are indicated at this point.

## 2011-08-10 NOTE — Progress Notes (Signed)
   HPI Patient presents for followup of his known cardiovascular problems.  Since I last saw him he has had no new cardiovascular complaints.  He denies any chest pain or palpitations.  He's had no presyncope or syncope. He's had no new shortness of breath, PND or orthopnea. He still has episodic fatigue worse in the mornings.  No Known Allergies  Current Outpatient Prescriptions  Medication Sig Dispense Refill  . amLODipine (NORVASC) 2.5 MG tablet Take 10 mg by mouth daily.        [name] aspirin 81 MG tablet Take 81 mg by mouth daily.        . clonazePAM (KLONOPIN) 1 MG tablet Take 1 mg by mouth at bedtime.        [name] levothyroxine (SYNTHROID, LEVOTHROID) 25 MCG tablet Take 25 mcg by mouth daily.       . nitroGLYCERIN (NITROSTAT) 0.4 MG SL tablet Place 0.4 mg under the tongue every 5 (five) minutes as needed.        . pantoprazole (PROTONIX) 40 MG tablet Take 40 mg by mouth 2 (two) times daily.        . pravastatin (PRAVACHOL) 40 MG tablet Take 1 tablet (40 mg total) by mouth every evening.      . Prenatal Vit-Fe Fumarate-FA (M-VIT) tablet Take 1 tablet by mouth daily.        . traZODone (DESYREL) 50 MG tablet Take 150 mg by mouth at bedtime. 1/2 pill at bedtime      . Coenzyme Q10 (CO Q 10) 100 MG CAPS Take 1 capsule by mouth daily.          Past Medical History  Diagnosis Date  . HYPOTHYROIDISM 05/20/2007  . HYPERLIPIDEMIA 10/20/2008  . DEPRESSION 12/17/2007  . MITRAL REGURGITATION 10/20/2008  . HYPERTENSION 04/10/2007  . CORONARY ARTERY DISEASE 04/10/2007  . Atrial fibrillation 04/12/2007  . GERD 04/10/2007  . DIVERTICULOSIS, COLON 04/10/2007  . BENIGN PROSTATIC HYPERTROPHY 04/10/2007  . GYNECOMASTIA, UNILATERAL 03/23/2010  . SLEEP APNEA 05/20/2007  . WEAKNESS 11/09/2009  . CHEST PAIN 05/11/2009  . Hematoma of abdominal wall   . Clotting disorder     anticoag. therapy    Past Surgical History  Procedure Date  . Transurethral resection of prostate     ROS:  Very fatigued.  Otherwise as  stated in the HPI and negative for all other systems.  PHYSICAL EXAM BP 142/80  Pulse 72  Ht 5\' 11"  (1.803 m)  Wt 203 lb (92.08 kg)  BMI 28.31 kg/m2 GENERAL:  Sad affect NECK:  No jugular venous distention, waveform within normal limits, carotid upstroke brisk and symmetric, no bruits, no thyromegaly LUNGS:  Clear to auscultation bilaterally BACK:  No CVA tenderness CHEST:  Unremarkable HEART:  PMI not displaced or sustained,S1 and S2 within normal limits, no S3, no S4, no clicks, no rubs, no murmurs ABD:  Flat, positive bowel sounds normal in frequency in pitch, no bruits, no rebound, no guarding, no midline pulsatile mass, no hepatomegaly, no splenomegaly EXT:  2 plus pulses throughout, no edema, no cyanosis no clubbing NEURO:  Cranial nerves II through XII grossly intact, motor grossly intact throughout  ASSESSMENT AND PLAN

## 2011-08-10 NOTE — Assessment & Plan Note (Signed)
His blood pressure is slightly labile. However, for the most part is well controlled and he will continue the meds as listed.

## 2011-08-24 ENCOUNTER — Encounter: Payer: Self-pay | Admitting: Internal Medicine

## 2011-08-24 ENCOUNTER — Ambulatory Visit (INDEPENDENT_AMBULATORY_CARE_PROVIDER_SITE_OTHER): Payer: Medicare Other | Admitting: Internal Medicine

## 2011-08-24 DIAGNOSIS — R0602 Shortness of breath: Secondary | ICD-10-CM | POA: Diagnosis not present

## 2011-08-24 DIAGNOSIS — I10 Essential (primary) hypertension: Secondary | ICD-10-CM

## 2011-08-24 DIAGNOSIS — N401 Enlarged prostate with lower urinary tract symptoms: Secondary | ICD-10-CM | POA: Diagnosis not present

## 2011-08-24 DIAGNOSIS — I4891 Unspecified atrial fibrillation: Secondary | ICD-10-CM

## 2011-08-24 DIAGNOSIS — F329 Major depressive disorder, single episode, unspecified: Secondary | ICD-10-CM

## 2011-08-24 DIAGNOSIS — R079 Chest pain, unspecified: Secondary | ICD-10-CM

## 2011-08-24 DIAGNOSIS — F3289 Other specified depressive episodes: Secondary | ICD-10-CM

## 2011-08-24 DIAGNOSIS — I2510 Atherosclerotic heart disease of native coronary artery without angina pectoris: Secondary | ICD-10-CM

## 2011-08-24 MED ORDER — WARFARIN SODIUM 5 MG PO TABS: 5.0000 mg | ORAL_TABLET | Freq: Every day | ORAL | Status: DC

## 2011-08-24 MED ORDER — AMIODARONE HCL 200 MG PO TABS: 400.0000 mg | ORAL_TABLET | Freq: Every day | ORAL | Status: DC

## 2011-08-24 NOTE — Patient Instructions (Signed)
Return office visit 4 days

## 2011-08-24 NOTE — Progress Notes (Signed)
  Subjective:    Patient ID: [name], male    DOB: [birth year], 78 y.o.   MRN: [id number]  HPI  78 year old patient who has a history of coronary artery disease mitral regurgitation and history of chest pain. He was seen by cardiology recently and was stable. He also has a history of paroxysmal atrial fibrillation and had been on amiodarone which was discontinued due to weakness and fatigue. He has a history of depression and chronic neurasthenia.  He states the weakness and fatigue did not improve with discontinuation of amiodarone . He has been on anticoagulant therapy in the past that has been discontinued due to history of abdominal wall hematoma secondary to a fall .  For the past 2 or 3 weeks he states that he has had perhaps some increasing weakness and intermittent shortness of breath. He does track his blood pressure daily in the morning and was 66 yesterday.  He states that he noted his heart rate to be 100 today and noted to be irregular and 114 during a urology visit earlier this morning.    Review of Systems  Constitutional: Positive for fatigue. Negative for fever, chills and appetite change.  HENT: Negative for hearing loss, ear pain, congestion, sore throat, trouble swallowing, neck stiffness, dental problem, voice change and tinnitus.   Eyes: Negative for pain, discharge and visual disturbance.  Respiratory: Positive for shortness of breath. Negative for cough, chest tightness, wheezing and stridor.   Cardiovascular: Negative for chest pain, palpitations and leg swelling.  Gastrointestinal: Negative for nausea, vomiting, abdominal pain, diarrhea, constipation, blood in stool and abdominal distention.  Genitourinary: Negative for urgency, hematuria, flank pain, discharge, difficulty urinating and genital sores.  Musculoskeletal: Negative for myalgias, back pain, joint swelling, arthralgias and gait problem.  Skin: Negative for rash.  Neurological: Positive for weakness. Negative  for dizziness, syncope, speech difficulty, numbness and headaches.  Hematological: Negative for adenopathy. Does not bruise/bleed easily.  Psychiatric/Behavioral: Negative for behavioral problems and dysphoric mood. The patient is not nervous/anxious.        Objective:   Physical Exam  Constitutional: He is oriented to person, place, and time. He appears well-developed.  HENT:  Head: Normocephalic.  Right Ear: External ear normal.  Left Ear: External ear normal.  Eyes: Conjunctivae and EOM are normal.  Neck: Normal range of motion.  Cardiovascular: Normal heart sounds.        Rate 110 and irregular  Pulmonary/Chest: Effort normal and breath sounds normal. No respiratory distress. He has no wheezes. He has no rales.  Abdominal: Bowel sounds are normal.  Musculoskeletal: Normal range of motion. He exhibits no edema and no tenderness.  Neurological: He is alert and oriented to person, place, and time.  Psychiatric: He has a normal mood and affect. His behavior is normal.          Assessment & Plan:   Recurrent atrial fibrillation Hypertension Chronic fatigue  Will discuss with cardiology. We'll  resume amiodarone at a dose of 400 mg daily and restart Coumadin anticoagulation.  We'll recheck in 4 days and check pro time at that time. He will report any clinical worsening such as fatigue shortness of breath or chest pain

## 2011-08-28 ENCOUNTER — Ambulatory Visit: Payer: Medicare Other | Admitting: Internal Medicine

## 2011-08-28 ENCOUNTER — Ambulatory Visit (INDEPENDENT_AMBULATORY_CARE_PROVIDER_SITE_OTHER): Payer: Medicare Other | Admitting: Physician Assistant

## 2011-08-28 ENCOUNTER — Encounter: Payer: Self-pay | Admitting: Physician Assistant

## 2011-08-28 VITALS — BP 112/80 | HR 112 | Ht 72.0 in | Wt 202.0 lb

## 2011-08-28 DIAGNOSIS — E039 Hypothyroidism, unspecified: Secondary | ICD-10-CM | POA: Diagnosis not present

## 2011-08-28 DIAGNOSIS — I10 Essential (primary) hypertension: Secondary | ICD-10-CM

## 2011-08-28 DIAGNOSIS — I4891 Unspecified atrial fibrillation: Secondary | ICD-10-CM

## 2011-08-28 LAB — HEPATIC FUNCTION PANEL
Alkaline Phosphatase: 65 U/L (ref 39–117)
Bilirubin, Direct: 0 mg/dL (ref 0.0–0.3)

## 2011-08-28 LAB — BASIC METABOLIC PANEL
Calcium: 8.5 mg/dL (ref 8.4–10.5)
GFR: 57.67 mL/min — ABNORMAL LOW (ref >60.00)
Sodium: 137 mEq/L (ref 135–145)

## 2011-08-28 LAB — TSH: TSH: 2.6 u[IU]/mL (ref 0.35–5.50)

## 2011-08-28 MED ORDER — METOPROLOL TARTRATE 25 MG PO TABS: 25.0000 mg | ORAL_TABLET | Freq: Two times a day (BID) | ORAL | Status: DC

## 2011-08-28 NOTE — Assessment & Plan Note (Signed)
Blood pressure stable ? ?

## 2011-08-28 NOTE — Assessment & Plan Note (Signed)
Patient has recurrent atrial fibrillation with rapid ventricular response after being off amiodarone for 6 months. He tolerated the amiodarone before and it was a waist up due to weakness and fatigue that did not improve off a meal. Amiodarone was resumed by Dr. Amador Cunas last week as well as Coumadin. I will add metoprolol 25 mg b.i.d. for better rate control. He will follow up with Dr. Antoine Poche in 2 weeks.

## 2011-08-28 NOTE — Assessment & Plan Note (Signed)
Stable without chest pain. Normal Lexiscan in August 2012 ejection fraction 64%

## 2011-08-28 NOTE — Assessment & Plan Note (Signed)
Will check TSH on amiodarone.

## 2011-08-28 NOTE — Patient Instructions (Signed)
Your physician recommends that you schedule a follow-up appointment in: 2 weeks with Dr Antoine Poche Your physician recommends that you return for lab work in: today (BMP, TSH, Liver) Your physician has recommended you make the following change in your medication: START Metoprolol Tartrate 25 mg twice daily

## 2011-08-28 NOTE — Progress Notes (Signed)
HPI:   An 78 year old white male patient who has a history of coronary artery disease, hypertension, mild mitral regurgitation on echo in 2012, and history of atrial fibrillation. He was maintained in normal sinus rhythm on amiodarone but this was stopped about 6 months ago due to weakness and fatigue. His weakness and fatigue did not improve off the amiodarone.  Last week he was doing a lot of heavy exertion trying to pull out cabinets and went back into atrial fibrillation. He says his pulses been consistently over 100 beats per minute. He complains of dyspnea on exertion and weakness. He denies chest pain.   No Known Allergies  Current Outpatient Prescriptions on File Prior to Visit  Medication Sig Dispense Refill  . amiodarone (PACERONE) 200 MG tablet Take 2 tablets (400 mg total) by mouth daily.  30 tablet  3  . amLODipine (NORVASC) 2.5 MG tablet Take 10 mg by mouth daily.        . clonazePAM (KLONOPIN) 1 MG tablet Take 1 mg by mouth at bedtime.        [name] levothyroxine (SYNTHROID, LEVOTHROID) 25 MCG tablet Take 25 mcg by mouth daily.       . nitroGLYCERIN (NITROSTAT) 0.4 MG SL tablet Place 0.4 mg under the tongue every 5 (five) minutes as needed.        . pantoprazole (PROTONIX) 40 MG tablet Take 40 mg by mouth 2 (two) times daily.        . pravastatin (PRAVACHOL) 40 MG tablet Take 1 tablet (40 mg total) by mouth every evening.      . Prenatal Vit-Fe Fumarate-FA (M-VIT) tablet Take 1 tablet by mouth daily.        . traZODone (DESYREL) 50 MG tablet Take 150 mg by mouth at bedtime. 1/2 pill at bedtime      . warfarin (COUMADIN) 5 MG tablet Take 1 tablet (5 mg total) by mouth daily.  30 tablet  3  . Coenzyme Q10 (CO Q 10) 100 MG CAPS Take 1 capsule by mouth daily.          Past Medical History  Diagnosis Date  . HYPOTHYROIDISM 05/20/2007  . HYPERLIPIDEMIA 10/20/2008  . DEPRESSION 12/17/2007  . MITRAL REGURGITATION 10/20/2008  . HYPERTENSION 04/10/2007  . CORONARY ARTERY DISEASE 04/10/2007  .  Atrial fibrillation 04/12/2007  . GERD 04/10/2007  . DIVERTICULOSIS, COLON 04/10/2007  . BENIGN PROSTATIC HYPERTROPHY 04/10/2007  . GYNECOMASTIA, UNILATERAL 03/23/2010  . SLEEP APNEA 05/20/2007  . WEAKNESS 11/09/2009  . CHEST PAIN 05/11/2009  . Hematoma of abdominal wall   . Clotting disorder     anticoag. therapy    Past Surgical History  Procedure Date  . Transurethral resection of prostate     Family History  Problem Relation Age of Onset  . Hypertension Other   . Heart disease Other     CAD male 1st degree relative    History   Social History  . Marital Status: Married    Spouse Name: N/A    Number of Children: N/A  . Years of Education: N/A   Occupational History  . Not on file.   Social History Main Topics  . Smoking status: Never Smoker   . Smokeless tobacco: Never Used  . Alcohol Use: No  . Drug Use: Not on file  . Sexually Active: Not on file   Other Topics Concern  . Not on file   Social History Narrative  . No narrative on file    ROS:see  history of present illness otherwise negative   PHYSICAL EXAM: Well-nournished, in no acute distress. Neck: No JVD, HJR, Bruit, or thyroid enlargement Lungs: No tachypnea, clear without wheezing, rales, or rhonchi Cardiovascular:irregular irregular, PMI not displaced, 1/6 systolic murmur at the left sternal border, no gallops, bruit, thrill, or heave. Abdomen: BS normal. Soft without organomegaly, masses, lesions or tenderness. Extremities: without cyanosis, clubbing or edema. Good distal pulses bilateral SKin: Warm, no lesions or rashes  Musculoskeletal: No deformities Neuro: no focal signs  BP 112/80  Pulse 112  Ht 6' (1.829 m)  Wt 202 lb (91.627 kg)  BMI 27.40 kg/m2   XBM:WUXLKG fibrillation at 112 beats per minute, nonspecific ST-T wave changes

## 2011-08-28 NOTE — Progress Notes (Signed)
Addended by: Judithe Modest D on: 08/28/2011 09:06 AM   Modules accepted: Orders

## 2011-09-01 ENCOUNTER — Ambulatory Visit (INDEPENDENT_AMBULATORY_CARE_PROVIDER_SITE_OTHER): Payer: Medicare Other | Admitting: *Deleted

## 2011-09-01 DIAGNOSIS — Z7901 Long term (current) use of anticoagulants: Secondary | ICD-10-CM

## 2011-09-01 DIAGNOSIS — I4891 Unspecified atrial fibrillation: Secondary | ICD-10-CM

## 2011-09-01 NOTE — Patient Instructions (Signed)

## 2011-09-08 ENCOUNTER — Ambulatory Visit (INDEPENDENT_AMBULATORY_CARE_PROVIDER_SITE_OTHER): Payer: Medicare Other | Admitting: Pharmacist

## 2011-09-08 DIAGNOSIS — I4891 Unspecified atrial fibrillation: Secondary | ICD-10-CM

## 2011-09-08 DIAGNOSIS — Z7901 Long term (current) use of anticoagulants: Secondary | ICD-10-CM

## 2011-09-13 ENCOUNTER — Inpatient Hospital Stay (HOSPITAL_COMMUNITY)
Admission: EM | Admit: 2011-09-13 | Discharge: 2011-09-16 | DRG: 308 | Disposition: A | Discharge status: Home / Self Care | Payer: Medicare Other | Attending: Cardiovascular Disease | Admitting: Cardiovascular Disease

## 2011-09-13 ENCOUNTER — Emergency Department (HOSPITAL_COMMUNITY): Payer: Medicare Other

## 2011-09-13 ENCOUNTER — Encounter (HOSPITAL_COMMUNITY): Payer: Self-pay | Admitting: Emergency Medicine

## 2011-09-13 ENCOUNTER — Other Ambulatory Visit: Payer: Self-pay

## 2011-09-13 ENCOUNTER — Telehealth: Payer: Self-pay | Admitting: Cardiology

## 2011-09-13 DIAGNOSIS — I4891 Unspecified atrial fibrillation: Secondary | ICD-10-CM | POA: Diagnosis not present

## 2011-09-13 DIAGNOSIS — R0602 Shortness of breath: Secondary | ICD-10-CM | POA: Diagnosis not present

## 2011-09-13 DIAGNOSIS — R079 Chest pain, unspecified: Secondary | ICD-10-CM | POA: Diagnosis not present

## 2011-09-13 DIAGNOSIS — I252 Old myocardial infarction: Secondary | ICD-10-CM

## 2011-09-13 DIAGNOSIS — Z8719 Personal history of other diseases of the digestive system: Secondary | ICD-10-CM

## 2011-09-13 DIAGNOSIS — I499 Cardiac arrhythmia, unspecified: Secondary | ICD-10-CM | POA: Diagnosis not present

## 2011-09-13 DIAGNOSIS — N289 Disorder of kidney and ureter, unspecified: Secondary | ICD-10-CM | POA: Diagnosis present

## 2011-09-13 DIAGNOSIS — Z7901 Long term (current) use of anticoagulants: Secondary | ICD-10-CM

## 2011-09-13 DIAGNOSIS — R5381 Other malaise: Secondary | ICD-10-CM | POA: Diagnosis not present

## 2011-09-13 DIAGNOSIS — I10 Essential (primary) hypertension: Secondary | ICD-10-CM | POA: Diagnosis present

## 2011-09-13 DIAGNOSIS — Z9861 Coronary angioplasty status: Secondary | ICD-10-CM | POA: Diagnosis not present

## 2011-09-13 DIAGNOSIS — J90 Pleural effusion, not elsewhere classified: Secondary | ICD-10-CM | POA: Diagnosis not present

## 2011-09-13 DIAGNOSIS — I509 Heart failure, unspecified: Secondary | ICD-10-CM | POA: Diagnosis not present

## 2011-09-13 DIAGNOSIS — R531 Weakness: Secondary | ICD-10-CM

## 2011-09-13 DIAGNOSIS — I2510 Atherosclerotic heart disease of native coronary artery without angina pectoris: Secondary | ICD-10-CM | POA: Diagnosis present

## 2011-09-13 DIAGNOSIS — E039 Hypothyroidism, unspecified: Secondary | ICD-10-CM | POA: Diagnosis present

## 2011-09-13 DIAGNOSIS — I959 Hypotension, unspecified: Secondary | ICD-10-CM | POA: Diagnosis present

## 2011-09-13 DIAGNOSIS — R404 Transient alteration of awareness: Secondary | ICD-10-CM | POA: Diagnosis not present

## 2011-09-13 DIAGNOSIS — R002 Palpitations: Secondary | ICD-10-CM | POA: Diagnosis not present

## 2011-09-13 DIAGNOSIS — I5033 Acute on chronic diastolic (congestive) heart failure: Secondary | ICD-10-CM | POA: Diagnosis not present

## 2011-09-13 HISTORY — DX: Barrett's esophagus without dysplasia: K22.70

## 2011-09-13 HISTORY — DX: Gastro-esophageal laceration-hemorrhage syndrome: K22.6

## 2011-09-13 LAB — DIFFERENTIAL
Eosinophils Absolute: 0.1 10*3/uL (ref 0.0–0.7)
Lymphocytes Relative: 27 % (ref 12–46)
Lymphs Abs: 1.5 10*3/uL (ref 0.7–4.0)
Neutro Abs: 3.4 10*3/uL (ref 1.7–7.7)
Neutrophils Relative %: 61 % (ref 43–77)

## 2011-09-13 LAB — CARDIAC PANEL(CRET KIN+CKTOT+MB+TROPI)
CK, MB: 1.8 ng/mL (ref 0.3–4.0)
Relative Index: INVALID (ref 0.0–2.5)
Total CK: 44 U/L (ref 7–232)
Troponin I: <0.3 ng/mL (ref <0.30)
Troponin I: <0.3 ng/mL (ref <0.30)

## 2011-09-13 LAB — BASIC METABOLIC PANEL
GFR calc non Af Amer: 50 mL/min — ABNORMAL LOW (ref >90)
Glucose, Bld: 85 mg/dL (ref 70–99)
Potassium: 4.1 mEq/L (ref 3.5–5.1)
Sodium: 143 mEq/L (ref 135–145)

## 2011-09-13 LAB — CBC
MCH: 29.9 pg (ref 26.0–34.0)
Platelets: 173 10*3/uL (ref 150–400)
RBC: 3.55 MIL/uL — ABNORMAL LOW (ref 4.22–5.81)
WBC: 5.6 10*3/uL (ref 4.0–10.5)

## 2011-09-13 LAB — PROTIME-INR: Prothrombin Time: 35.9 seconds — ABNORMAL HIGH (ref 11.6–15.2)

## 2011-09-13 MED ORDER — PANTOPRAZOLE SODIUM 40 MG PO TBEC
40.0000 mg | DELAYED_RELEASE_TABLET | Freq: Two times a day (BID) | ORAL | Status: DC
  Administered 2011-09-13 – 2011-09-16 (×4): 40 mg via ORAL
  Filled 2011-09-13 (×5): qty 1

## 2011-09-13 MED ORDER — AMLODIPINE BESYLATE 5 MG PO TABS
5.0000 mg | ORAL_TABLET | Freq: Every day | ORAL | Status: DC
  Administered 2011-09-13: 5 mg via ORAL
  Filled 2011-09-13 (×2): qty 1

## 2011-09-13 MED ORDER — ACETAMINOPHEN 325 MG PO TABS: 650.0000 mg | ORAL_TABLET | ORAL | Status: DC | PRN

## 2011-09-13 MED ORDER — FENTANYL CITRATE 0.05 MG/ML IJ SOLN: 250.0000 ug | Freq: Once | INTRAMUSCULAR | Status: DC

## 2011-09-13 MED ORDER — NITROGLYCERIN 0.4 MG SL SUBL
0.4000 mg | SUBLINGUAL_TABLET | SUBLINGUAL | Status: DC | PRN
  Administered 2011-09-13: 0.4 mg via SUBLINGUAL
  Filled 2011-09-13: qty 25

## 2011-09-13 MED ORDER — ONDANSETRON HCL 4 MG/2ML IJ SOLN: 4.0000 mg | Freq: Four times a day (QID) | INTRAMUSCULAR | Status: DC | PRN

## 2011-09-13 MED ORDER — LEVOTHYROXINE SODIUM 25 MCG PO TABS
25.0000 ug | ORAL_TABLET | Freq: Every day | ORAL | Status: DC
  Administered 2011-09-14 – 2011-09-16 (×3): 25 ug via ORAL
  Filled 2011-09-13 (×4): qty 1

## 2011-09-13 MED ORDER — METOPROLOL TARTRATE 25 MG PO TABS
25.0000 mg | ORAL_TABLET | Freq: Two times a day (BID) | ORAL | Status: DC
  Administered 2011-09-13: 25 mg via ORAL
  Filled 2011-09-13 (×3): qty 1

## 2011-09-13 MED ORDER — SODIUM CHLORIDE 0.9 % IJ SOLN: 3.0000 mL | INTRAMUSCULAR | Status: DC | PRN

## 2011-09-13 MED ORDER — FUROSEMIDE 10 MG/ML IJ SOLN
20.0000 mg | Freq: Once | INTRAMUSCULAR | Status: AC
  Administered 2011-09-13: 20 mg via INTRAVENOUS
  Filled 2011-09-13: qty 2

## 2011-09-13 MED ORDER — SODIUM CHLORIDE 0.45 % IV SOLN
INTRAVENOUS | Status: DC
  Administered 2011-09-14: 06:00:00 via INTRAVENOUS

## 2011-09-13 MED ORDER — ZOLPIDEM TARTRATE 5 MG PO TABS: 5.0000 mg | ORAL_TABLET | Freq: Every evening | ORAL | Status: DC | PRN

## 2011-09-13 MED ORDER — SODIUM CHLORIDE 0.9 % IV SOLN: 250.0000 mL | INTRAVENOUS | Status: DC | PRN

## 2011-09-13 MED ORDER — AMIODARONE HCL 200 MG PO TABS
200.0000 mg | ORAL_TABLET | Freq: Two times a day (BID) | ORAL | Status: DC
  Administered 2011-09-13 – 2011-09-16 (×6): 200 mg via ORAL
  Filled 2011-09-13 (×7): qty 1

## 2011-09-13 MED ORDER — ALPRAZOLAM 0.25 MG PO TABS: 0.2500 mg | ORAL_TABLET | Freq: Two times a day (BID) | ORAL | Status: DC | PRN

## 2011-09-13 MED ORDER — SIMVASTATIN 40 MG PO TABS
40.0000 mg | ORAL_TABLET | Freq: Every day | ORAL | Status: DC
  Administered 2011-09-13 – 2011-09-14 (×2): 40 mg via ORAL
  Filled 2011-09-13 (×3): qty 1

## 2011-09-13 MED ORDER — TRAZODONE HCL 150 MG PO TABS
150.0000 mg | ORAL_TABLET | Freq: Every day | ORAL | Status: DC
  Administered 2011-09-13 – 2011-09-15 (×3): 150 mg via ORAL
  Filled 2011-09-13 (×4): qty 1

## 2011-09-13 MED ORDER — SODIUM CHLORIDE 0.9 % IJ SOLN
3.0000 mL | Freq: Two times a day (BID) | INTRAMUSCULAR | Status: DC
  Administered 2011-09-13 – 2011-09-16 (×6): 3 mL via INTRAVENOUS

## 2011-09-13 MED ORDER — BENZOCAINE 20 % MT SOLN
1.0000 "application " | OROMUCOSAL | Status: DC | PRN
  Filled 2011-09-13: qty 57

## 2011-09-13 MED ORDER — M-VIT PO TABS: 1.0000 | ORAL_TABLET | Freq: Every day | ORAL | Status: DC

## 2011-09-13 MED ORDER — PRENATAL MULTIVITAMIN CH
1.0000 | ORAL_TABLET | Freq: Every day | ORAL | Status: DC
  Administered 2011-09-14 – 2011-09-16 (×3): 1 via ORAL
  Filled 2011-09-13 (×3): qty 1

## 2011-09-13 MED ORDER — CLONAZEPAM 0.5 MG PO TABS
1.0000 mg | ORAL_TABLET | Freq: Every day | ORAL | Status: DC
  Administered 2011-09-14 – 2011-09-15 (×3): 1 mg via ORAL
  Filled 2011-09-13: qty 1
  Filled 2011-09-13: qty 2
  Filled 2011-09-13: qty 1
  Filled 2011-09-13: qty 2

## 2011-09-13 MED ORDER — CO Q 10 100 MG PO CAPS: 1.0000 | ORAL_CAPSULE | Freq: Every day | ORAL | Status: DC

## 2011-09-13 MED ORDER — MIDAZOLAM HCL 10 MG/2ML IJ SOLN: 10.0000 mg | Freq: Once | INTRAMUSCULAR | Status: DC

## 2011-09-13 MED ORDER — SODIUM CHLORIDE 0.9 % IJ SOLN: 3.0000 mL | Freq: Two times a day (BID) | INTRAMUSCULAR | Status: DC

## 2011-09-13 MED ORDER — NITROGLYCERIN 0.4 MG SL SUBL
0.4000 mg | SUBLINGUAL_TABLET | SUBLINGUAL | Status: DC | PRN
  Administered 2011-09-14: 0.4 mg via SUBLINGUAL
  Filled 2011-09-13: qty 25

## 2011-09-13 NOTE — ED Notes (Signed)
[id number] Ready

## 2011-09-13 NOTE — Telephone Encounter (Signed)
PT IS LIGHT HEADED/DIZZY/HEART OUT OF RHYTHM AND BEATING FAST AND HE HAS AN APPT WITH DR [facility] ON Friday BUT THEY WANT TO BE SEEN TODAY. CALLED [facility] AND HE SAID TO SEND TO TRIAGE TO MAKE SURE PT DOESN'T NEED TO GO TO ER

## 2011-09-13 NOTE — ED Notes (Signed)
EKG Done on arrival and given Dr. Golda Acre. New and Old

## 2011-09-13 NOTE — Consult Note (Signed)
CARDIOLOGY CONSULT NOTE   Patient ID: LLIAM [name] MRN: [id number] DOB/AGE: [birth year] 76 y.o.  Admit date: 09/13/2011  Primary Physician   Rogelia Boga, MD, MD Primary Cardiologist   [facility] Reason for Consultation   atrial fibrillation and weakness  [name]:WJXB [name] is a 78 y.o. male with a history of CAD.   He has a history of atrial fibrillation. He has had several cardioversions, the last being in 2008. He was on amiodarone but this was discontinued because of chest pain and weakness. He had Tikosyn loading and then cardioversion in 2008. He was seen in the office for weakness by Dr. Antoine Poche in August of 2012. At that time his TSH was within normal limits. His amiodarone was discontinued and initially he reported improvement in his weakness at an office visit on 08/10/2011, but then his symptoms returned.   After some exertion a few weeks ago he noticed that his heart rate was elevated when he took his blood pressure. He has had a return of his weakness which has gotten worse. He is concerned that his heart rates have been in the 80s and 90s and many of them greater than 100. He does not have palpitations but says his heart rate is never elevated unless he is in atrial fib. His weakness has gotten worse to the point that he just doesn't think he can make it any more and that is the reason he came to the hospital today. Upon review of his home blood pressures, several of them are in the 90s systolic and some of them are in the 80s systolic. These readings were taken after he was started on metoprolol 25 mg twice a day at his office visit on 08/28/2011. He is not aware when his heart rate is elevated and has not had any presyncope or syncope. He has not had any chest pain but has had significant dyspnea on exertion and states he cannot walk across the room without getting short of breath.  After the initial evaluation, he had some mild chest pain that has resolved.   Past Medical  History  Diagnosis Date  . HYPOTHYROIDISM 05/20/2007  . HYPERLIPIDEMIA 10/20/2008  . DEPRESSION 12/17/2007  . MITRAL REGURGITATION 10/20/2008  . HYPERTENSION 04/10/2007  . CORONARY ARTERY DISEASE 04/10/2007  . Atrial fibrillation 04/12/2007   Myocardial infarction in April 2006 with PTCA and BMS to the RCA    PTCA to the OM3 in 2002 and 2005   . GERD 04/10/2007  . DIVERTICULOSIS, COLON 04/10/2007  . BENIGN PROSTATIC HYPERTROPHY 04/10/2007  . GYNECOMASTIA, UNILATERAL 03/23/2010  . SLEEP APNEA 05/20/2007  . WEAKNESS 11/09/2009  . CHEST PAIN 05/11/2009  . Hematoma of abdominal wall    UGI bleed secondary to Mallory-Weiss tear  2002  . Clotting disorder     anticoag. therapy     Past Surgical History  Procedure Date   EGD/colonoscopy    Cardiac caths/PCI   . Transurethral resection of prostate     No Known Allergies  I have reviewed the patient's current medications.   Medication Sig    amiodarone 200 mg   1 tablet by mouth twice a day   . amLODipine (NORVASC) 2.5 MG tablet Take 10 mg by mouth daily as needed. If blood pressure is too high  . clonazePAM (KLONOPIN) 1 MG tablet Take 1 mg by mouth at bedtime.    . Coenzyme Q10 (CO Q 10) 100 MG CAPS Take 1 capsule by mouth daily.    [name]  levothyroxine (SYNTHROID, LEVOTHROID) 25 MCG tablet Take 25 mcg by mouth daily.   . metoprolol tartrate (LOPRESSOR) 25 MG tablet Take 1 tablet (25 mg total) by mouth 2 (two) times daily.  . pantoprazole (PROTONIX) 40 MG tablet Take 40 mg by mouth 2 (two) times daily.    . pravastatin (PRAVACHOL) 40 MG tablet Take 1 tablet (40 mg total) by mouth every evening.  . Prenatal Vit-Fe Fumarate-FA (M-VIT) tablet Take 1 tablet by mouth daily.    . traZODone (DESYREL) 50 MG tablet Take 150 mg by mouth at bedtime. 1/2 pill at bedtime  . nitroGLYCERIN (NITROSTAT) 0.4 MG SL tablet Place 0.4 mg under the tongue every 5 (five) minutes as needed.     History   Social History  . Marital Status: Married    Spouse Name: N/A     Number of Children: N/A  . Years of Education: N/A   Occupational History  . Retired from [employer].   Social History Main Topics  . Smoking status: Never Smoker   . Smokeless tobacco: Never Used  . Alcohol Use: No  . Drug Use: Not on file  . Sexually Active: Not on file   Other Topics Concern  . Not on file   Social History Narrative   He is retired from [employer] certified [employer].Mother lived into her 1's.  Father died in his 37's.  No CAD.    Family History  Problem Relation Age of Onset  . Hypertension Other   . Heart disease Brother     CAD male 1st degree relative   ROS: He has not had any recent illnesses, fevers or chills. He has occasional bright red blood per time from hemorrhoids but it is not significant and he denies melena. He was previously taken off Coumadin for a GI bleed, however it was restarted once his primary care physician realized he was back in atrial fibrillation. He feels that he is not having any side effects from the Coumadin. He has generalized weakness but no presyncope or unilateral symptoms. He has dyspnea on exertion but no chest pain. Full 14 point review of systems complete and found to be negative unless listed  above  Physical Exam: Blood pressure 128/75, pulse 88, temperature 97.8 F (36.6 C), resp. rate 17, SpO2 98.00%.   General: Well developed, well nourished, elderly white male, in no acute distress but appears mildly uncomfortable Head: Eyes PERRLA, No xanthomas.   Normocephalic and atraumatic, oropharynx without edema or exudate. Dentition  Lungs: Some rales in the bases Heart: Heart irregular rate and rhythm with S1, S2, 2/6  murmur. pulses are 2+ & equal all 4 extrem.   Neck: No carotid bruit. No lymphadenopathy.  JVD at about 8 cm. Abdomen: Bowel sounds present, abdomen soft and slightly diffusely tender, no guarding, without masses or hernias noted. Msk:  No spine or cva tenderness. He is weak, no joint deformities or effusions. Extremities:  No clubbing or cyanosis.  No edema.  Neuro: Alert and oriented X 3. No focal deficits noted. Psych:  Good affect, responds appropriately Skin: No rashes or lesions noted.  Labs:   Lab Results  Component Value Date   WBC 5.1 01/09/2011   HGB 11.6* 01/09/2011   HCT 34.3* 01/09/2011   MCV 90.3 01/09/2011   PLT 168.0 01/09/2011   Lab Results  Component Value Date   NA 143 09/13/2011   K 4.1 09/13/2011   CL 109 09/13/2011   CO2 26 09/13/2011   BUN  Date/Time Value  Range Status  09/13/2011 10:15 AM 23  6-23 (mg/dL) Final     Creatinine, Ser  Date/Time Value Range Status  09/13/2011 10:15 AM 1.29  0.50-1.35 (mg/dL) Final     Glucose, Bld  Date/Time Value Range Status  09/13/2011 10:15 AM 85  70-99 (mg/dL) Final   INR  Date Value Range Status  09/13/2011 3.53* 0.00-1.49 (no units) Final  09/08/2011 4.1   Final  INR - 2/15  2.2   Echo: 04/28/2011 Study Conclusions - Left ventricle: The cavity size was normal. Wall thickness was increased in a pattern of moderate LVH. Systolic function was normal. The estimated ejection fraction was in the range of 55% to 60%. Wall motion was normal; there were no regional wall motion abnormalities. Features are consistent with a pseudonormal left ventricular filling pattern, with concomitant abnormal relaxation and increased filling pressure (grade 2 diastolic dysfunction). - Aortic valve: Mild to moderate regurgitation. - Mitral valve: Mild regurgitation. - Left atrium: The atrium was mildly to moderately dilated. - Right ventricle: The cavity size was mildly dilated. - Right atrium: The atrium was mildly dilated.  Radiology:   Dg Chest Portable 1 View 09/13/2011  *RADIOLOGY REPORT*  Clinical Data: Palpitations.  Shortness of breath.  PORTABLE CHEST - 1 VIEW  Comparison: 11/20/2009.  Findings: Trachea is midline.  Heart is enlarged, stable.  There may be mild interstitial prominence, with a basilar predominance. Tiny left pleural effusion.   IMPRESSION: Suspect mild congestive heart failure.  Original Report Authenticated By: Reyes Ivan, M.D.   EKG:  13-Sep-2011 09:52:04  ATRIAL FIBRILLATION ~ ? Atrial activity INCOMPLETE RIGHT BUNDLE BRANCH BLOCK ~ QRSd >105, terminal axis(90,270) Abnormal ECG Vent. rate 94 BPM PR interval * ms QRS duration 106 ms QT/QTc 376/470 ms P-R-T axes * -8 34  ASSESSMENT AND PLAN:   The patient was seen today by Dr Excell Seltzer, the patient evaluated and the data reviewed.   1. Atrial Fibrillation with associated Weakness: He has complained of weakness in multiple office visits prior to his last office visit when he was started on metoprolol. Since then, his symptoms have gotten worse and he has been hypotensive at times. He is concerned about his heart rate being elevated but it is generally less than 100. His TSH was within normal limits 2 weeks ago and will not be rechecked. His symptoms may be secondary to his atrial fibrillation. He was restarted on amiodarone and Coumadin several weeks ago. Consideration can be given to a TEE/cardioversion. His EF was preserved by recent echocardiogram. He has no recent ischemic symptoms.  2. Acute on Chronic Diastolic Heart Failure: He has some mild volume overload by exam. BNP is pending. He has diastolic dysfunction by recent recent echocardiogram. He is not on a daily diuretic. We'll give him one dose of IV Lasix and follow his symptoms and shortness of breath.  Signed: Theodore Demark 09/13/2011, 10:04 AM Co-Sign MD  Patient seen, examined. Available data reviewed. Agree with findings, assessment, and plan as outlined by Theodore Demark, PA-C. The patient was independently interviewed and examined. I reviewed old notes, pertinent lab, echo, and radiographic data. Plan discussed in detail with the patient. The patient appears to have chronic symptoms of weakness, chest pain, and dyspnea. However, he has clear worsening of these symptoms over the past several  weeks. He was in sinus rhythm in April and August 2012 by review of old EKG's, and has been in AFib since at least early February this year. Heart rate is mildly  elevated based on his home readings and BP is marginal with systolic readings around 100 mmHg. I agree with an attempt at IV diuresis in setting increased BNP and CXR suggestive of mild interstitial edema. Also think DCCV is indicated to try to restore sinus rhythm. Therapeutic INR has been present only since 2/15, so he will likely need TEE-guided cardioversion. Would also continue amiodarone orally as he has been doing for a few weeks now. Will cycle enzymes but I don't think acute ischemia is causing his symptoms.  Tonny Bollman, M.D. 09/13/2011 1:33 PM

## 2011-09-13 NOTE — ED Notes (Signed)
Per EMS:  131/90 HR: 90-110 R: 18  On 2L o2 [place]

## 2011-09-13 NOTE — Telephone Encounter (Signed)
Spoke with pt's wife, regarding pt's heart being out of rhythm, weak, lightheaded, dizzy, B/p 106/69. Wife states pt. Is so weak that can't hardly walk. Pt would like to be seen today. Wife was made aware that because of his symptoms he needs to be taken to the [place] as soon is possible. Wife verbalized understanding. Rhonda for  [employer] notified of pt symptoms and arrival to the ER.

## 2011-09-13 NOTE — ED Notes (Signed)
Spoke to Hudson from [place]. Dr. Excell Seltzer is on his way to asses pt.

## 2011-09-13 NOTE — ED Provider Notes (Addendum)
History     CSN: [id number]  Arrival date & time 09/13/11  8295   First MD Initiated Contact with Patient 09/13/11 0940      Chief Complaint  Patient presents with  . Atrial Fibrillation  . Fatigue    (Consider location/radiation/quality/duration/timing/severity/associated sxs/prior treatment) HPI Comments: Patient presents today with generalized weakness that has been ongoing for approximately the last 2 weeks.  It has been slowly worsening since the patient did have his cardiac medications changed for his atrial fibrillation.  Patient notes he was placed back on amiodarone and Coumadin.  He denies any significant chest pain and has some mild shortness of breath.  He notes that he is unable to complete his normal daily activities due to the weakness as he is so weak he is almost unable to walk.  He does live independently at home with his wife.  He denies any new fevers, vomiting, diarrhea diarrhea or other infectious symptoms.  He did contact his cardiologist office prior to arrival here.  They directed him to show up in the emergency department for further evaluation.  Patient is a 78 y.o. male presenting with weakness. The history is provided by the patient. No language interpreter was used.  Weakness Primary symptoms do not include headaches, syncope, loss of consciousness, altered mental status, seizures, dizziness, visual change, paresthesias, focal weakness, loss of sensation, speech change, memory loss, fever, nausea or vomiting. The symptoms began more than 1 week ago. The symptoms are unchanged.  Additional symptoms include weakness.    Past Medical History  Diagnosis Date  . HYPOTHYROIDISM 05/20/2007  . HYPERLIPIDEMIA 10/20/2008  . DEPRESSION 12/17/2007  . MITRAL REGURGITATION 10/20/2008  . HYPERTENSION 04/10/2007  . CORONARY ARTERY DISEASE 04/10/2007  . Atrial fibrillation 04/12/2007  . GERD 04/10/2007  . DIVERTICULOSIS, COLON 04/10/2007  . BENIGN PROSTATIC HYPERTROPHY 04/10/2007    . GYNECOMASTIA, UNILATERAL 03/23/2010  . SLEEP APNEA 05/20/2007  . WEAKNESS 11/09/2009  . CHEST PAIN 05/11/2009  . Hematoma of abdominal wall   . Clotting disorder     anticoag. therapy  . Mallory - Weiss tear     > 5 years ago  . Barrett's esophagus     Past Surgical History  Procedure Date  . Transurethral resection of prostate   . Coronary angioplasty with stent placement   . Tee with cardioversion 7/08  . Cardioversion 8/08  . Esophagogastroduodenoscopy     Family History  Problem Relation Age of Onset  . Hypertension Other   . Heart disease Brother     CAD male 1st degree relative    History  Substance Use Topics  . Smoking status: Never Smoker   . Smokeless tobacco: Never Used  . Alcohol Use: No      Review of Systems  Constitutional: Negative for fever and chills.  HENT: Negative.   Eyes: Negative.  Negative for discharge and redness.  Respiratory: Positive for shortness of breath. Negative for cough.   Cardiovascular: Negative.  Negative for chest pain and syncope.  Gastrointestinal: Negative.  Negative for nausea, vomiting and abdominal pain.  Genitourinary: Negative.  Negative for hematuria.  Musculoskeletal: Negative.  Negative for back pain.  Skin: Negative.  Negative for color change and rash.  Neurological: Positive for weakness. Negative for dizziness, speech change, focal weakness, seizures, loss of consciousness, syncope, headaches and paresthesias.  Hematological: Negative.  Negative for adenopathy.  Psychiatric/Behavioral: Negative.  Negative for memory loss, confusion and altered mental status.  All other systems reviewed and are negative.  Allergies  Review of patient's allergies indicates no known allergies.  Home Medications   Current Outpatient Rx  Name Route Sig Dispense Refill  . AMIODARONE HCL 200 MG PO TABS Oral Take 200 mg by mouth 2 (two) times daily.    [name] AMLODIPINE BESYLATE 2.5 MG PO TABS Oral Take 10 mg by mouth daily as  needed. If blood pressure is too high    . CLONAZEPAM 1 MG PO TABS Oral Take 1 mg by mouth at bedtime.      . CO Q 10 100 MG PO CAPS Oral Take 1 capsule by mouth daily.      [name] LEVOTHYROXINE SODIUM 25 MCG PO TABS Oral Take 25 mcg by mouth daily.     [name] METOPROLOL TARTRATE 25 MG PO TABS Oral Take 1 tablet (25 mg total) by mouth 2 (two) times daily. 60 tablet 6  . PANTOPRAZOLE SODIUM 40 MG PO TBEC Oral Take 40 mg by mouth 2 (two) times daily.      [name] PRAVASTATIN SODIUM 40 MG PO TABS Oral Take 1 tablet (40 mg total) by mouth every evening.    . M-VIT PO TABS Oral Take 1 tablet by mouth daily.      . TRAZODONE HCL 50 MG PO TABS Oral Take 150 mg by mouth at bedtime. 1/2 pill at bedtime    . WARFARIN SODIUM 5 MG PO TABS Oral Take 2.5-5 mg by mouth See admin instructions. 2.5 mg tuesdays and saturdays, 5 mg all other days    . NITROGLYCERIN 0.4 MG SL SUBL Sublingual Place 0.4 mg under the tongue every 5 (five) minutes as needed.        BP 114/73  Pulse 83  Temp 97.8 F (36.6 C)  Resp 21  SpO2 99%  Physical Exam  Nursing note and vitals reviewed. Constitutional: He is oriented to person, place, and time. He appears well-developed and well-nourished.  Non-toxic appearance. He does not have a sickly appearance.  HENT:  Head: Normocephalic and atraumatic.  Eyes: Conjunctivae, EOM and lids are normal. Pupils are equal, round, and reactive to light.  Neck: Trachea normal, normal range of motion and full passive range of motion without pain. Neck supple.  Cardiovascular: Normal rate and normal heart sounds.  An irregularly irregular rhythm present. Exam reveals no gallop and no friction rub.   No murmur heard. Pulmonary/Chest: Effort normal and breath sounds normal. No respiratory distress. He has no wheezes. He has no rales.  Abdominal: Soft. Normal appearance. He exhibits no distension. There is no tenderness. There is no rebound and no CVA tenderness.  Musculoskeletal: Normal range of motion.    Neurological: He is alert and oriented to person, place, and time. He has normal strength.  Skin: Skin is warm, dry and intact. No rash noted.  Psychiatric: He has a normal mood and affect. His behavior is normal. Judgment and thought content normal.    ED Course  Procedures (including critical care time)  Results for orders placed during the hospital encounter of 09/13/11  CBC      Component Value Range   WBC 5.6  4.0 - 10.5 (K/uL)   RBC 3.55 (*) 4.22 - 5.81 (MIL/uL)   Hemoglobin 10.6 (*) 13.0 - 17.0 (g/dL)   HCT 45.4 (*) 09.8 - 52.0 (%)   MCV 89.9  78.0 - 100.0 (fL)   MCH 29.9  26.0 - 34.0 (pg)   MCHC 33.2  30.0 - 36.0 (g/dL)   RDW 11.9  14.7 -  15.5 (%)   Platelets 173  150 - 400 (K/uL)  DIFFERENTIAL      Component Value Range   Neutrophils Relative 61  43 - 77 (%)   Neutro Abs 3.4  1.7 - 7.7 (K/uL)   Lymphocytes Relative 27  12 - 46 (%)   Lymphs Abs 1.5  0.7 - 4.0 (K/uL)   Monocytes Relative 10  3 - 12 (%)   Monocytes Absolute 0.5  0.1 - 1.0 (K/uL)   Eosinophils Relative 2  0 - 5 (%)   Eosinophils Absolute 0.1  0.0 - 0.7 (K/uL)   Basophils Relative 0  0 - 1 (%)   Basophils Absolute 0.0  0.0 - 0.1 (K/uL)  BASIC METABOLIC PANEL      Component Value Range   Sodium 143  135 - 145 (mEq/L)   Potassium 4.1  3.5 - 5.1 (mEq/L)   Chloride 109  96 - 112 (mEq/L)   CO2 26  19 - 32 (mEq/L)   Glucose, Bld 85  70 - 99 (mg/dL)   BUN 23  6 - 23 (mg/dL)   Creatinine, Ser 8.65  0.50 - 1.35 (mg/dL)   Calcium 8.9  8.4 - 78.4 (mg/dL)   GFR calc non Af Amer 50 (*) >90 (mL/min)   GFR calc Af Amer 58 (*) >90 (mL/min)  CARDIAC PANEL(CRET KIN+CKTOT+MB+TROPI)      Component Value Range   Total CK 39  7 - 232 (U/L)   CK, MB 1.8  0.3 - 4.0 (ng/mL)   Troponin I <0.30  <0.30 (ng/mL)   Relative Index RELATIVE INDEX IS INVALID  0.0 - 2.5   APTT      Component Value Range   aPTT 54 (*) 24 - 37 (seconds)  PROTIME-INR      Component Value Range   Prothrombin Time 35.9 (*) 11.6 - 15.2 (seconds)    INR 3.53 (*) 0.00 - 1.49   PRO B NATRIURETIC PEPTIDE      Component Value Range   Pro B Natriuretic peptide (BNP) 775.0 (*) 0 - 450 (pg/mL)   Dg Chest Portable 1 View  09/13/2011  *RADIOLOGY REPORT*  Clinical Data: Palpitations.  Shortness of breath.  PORTABLE CHEST - 1 VIEW  Comparison: 11/20/2009.  Findings: Trachea is midline.  Heart is enlarged, stable.  There may be mild interstitial prominence, with a basilar predominance. Tiny left pleural effusion.  IMPRESSION: Suspect mild congestive heart failure.  Original Report Authenticated By: Reyes Ivan, M.D.      Date: 09/13/2011  Rate: 94  Rhythm: atrial fibrillation  QRS Axis: normal  Intervals: normal  ST/T Wave abnormalities: nonspecific T wave changes  Conduction Disutrbances:Incomplete right bundle-branch block  Narrative Interpretation:   Old EKG Reviewed: changes noted November 13, 2009   MDM  Patient with vague generalized weakness after changes in his cardiac medications 2 weeks ago.  Patient did contact his cardiologist office and they directed him to come to the emergency department.  I've contacted them here in the PA has been to see the patient otherwise not been able to discuss this patient with them.  He is currently pending her disposition at this time.  Patient shows no signs of acute MI or other dysrhythmias beyond atrophic relation which the patient has had in the past.  He is rate controlled at this time as well.        Nat Christen, MD 09/13/11 1317  Called Trish with [facility] at 4:10 pm to find out the  dispo on this pt and she believes that he is to be admitted.  I mentioned that there are no orders and she will f/u on this.    Nat Christen, MD 09/13/11 [id number]

## 2011-09-13 NOTE — ED Notes (Signed)
Per EMS: Pt is in AFIB.  His dose of amiodarone was increased about two weeks. Metoprolol and coumadin was also started at the same time.  Pt has been feeling fatigued since the change in medication.

## 2011-09-14 ENCOUNTER — Encounter (HOSPITAL_COMMUNITY): Payer: Self-pay | Admitting: *Deleted

## 2011-09-14 ENCOUNTER — Other Ambulatory Visit: Payer: Self-pay

## 2011-09-14 ENCOUNTER — Encounter (HOSPITAL_COMMUNITY)
Admission: EM | Disposition: A | Discharge status: Home / Self Care | Payer: Self-pay | Source: Home / Self Care | Attending: Cardiovascular Disease

## 2011-09-14 DIAGNOSIS — I4891 Unspecified atrial fibrillation: Secondary | ICD-10-CM

## 2011-09-14 DIAGNOSIS — I509 Heart failure, unspecified: Secondary | ICD-10-CM | POA: Diagnosis not present

## 2011-09-14 DIAGNOSIS — I5033 Acute on chronic diastolic (congestive) heart failure: Secondary | ICD-10-CM | POA: Diagnosis not present

## 2011-09-14 DIAGNOSIS — I2510 Atherosclerotic heart disease of native coronary artery without angina pectoris: Secondary | ICD-10-CM | POA: Diagnosis not present

## 2011-09-14 HISTORY — PX: CARDIOVERSION: SHX1299

## 2011-09-14 HISTORY — PX: TEE WITHOUT CARDIOVERSION: SHX5443

## 2011-09-14 LAB — CARDIAC PANEL(CRET KIN+CKTOT+MB+TROPI)
CK, MB: 1.6 ng/mL (ref 0.3–4.0)
Relative Index: INVALID (ref 0.0–2.5)
Total CK: 39 U/L (ref 7–232)
Troponin I: <0.3 ng/mL (ref <0.30)

## 2011-09-14 LAB — TSH: TSH: 5.693 u[IU]/mL — ABNORMAL HIGH (ref 0.350–4.500)

## 2011-09-14 LAB — T3, FREE: T3, Free: 2.6 pg/mL (ref 2.3–4.2)

## 2011-09-14 LAB — PROTIME-INR: Prothrombin Time: 32.7 seconds — ABNORMAL HIGH (ref 11.6–15.2)

## 2011-09-14 LAB — T4, FREE: Free T4: 1.41 ng/dL (ref 0.80–1.80)

## 2011-09-14 SURGERY — TRANSESOPHAGEAL ECHOCARDIOGRAM WITH CARDIOVERSION

## 2011-09-14 SURGERY — ECHOCARDIOGRAM, TRANSESOPHAGEAL
Anesthesia: Moderate Sedation

## 2011-09-14 MED ORDER — SODIUM CHLORIDE 0.9 % IV SOLN
250.0000 mL | INTRAVENOUS | Status: DC | PRN
  Administered 2011-09-14: 500 mL via INTRAVENOUS

## 2011-09-14 MED ORDER — SODIUM CHLORIDE 0.9 % IJ SOLN
3.0000 mL | Freq: Two times a day (BID) | INTRAMUSCULAR | Status: DC
Start: 2011-09-14 — End: 2011-09-14

## 2011-09-14 MED ORDER — MIDAZOLAM HCL 10 MG/2ML IJ SOLN
INTRAMUSCULAR | Status: DC | PRN
  Administered 2011-09-14: 1 mg via INTRAVENOUS
  Administered 2011-09-14 (×2): 2 mg via INTRAVENOUS

## 2011-09-14 MED ORDER — FENTANYL CITRATE 0.05 MG/ML IJ SOLN
INTRAMUSCULAR | Status: AC
  Filled 2011-09-14: qty 2

## 2011-09-14 MED ORDER — MIDAZOLAM HCL 10 MG/2ML IJ SOLN
INTRAMUSCULAR | Status: AC
  Filled 2011-09-14: qty 2

## 2011-09-14 MED ORDER — MORPHINE SULFATE 2 MG/ML IJ SOLN
INTRAMUSCULAR | Status: AC
  Filled 2011-09-14: qty 1

## 2011-09-14 MED ORDER — SODIUM CHLORIDE 0.9 % IJ SOLN: 3.0000 mL | INTRAMUSCULAR | Status: DC | PRN

## 2011-09-14 MED ORDER — NITROGLYCERIN 0.4 MG SL SUBL
SUBLINGUAL_TABLET | SUBLINGUAL | Status: AC
  Filled 2011-09-14: qty 25

## 2011-09-14 MED ORDER — WARFARIN - PHARMACIST DOSING INPATIENT
Freq: Every day | Status: DC
  Administered 2011-09-15: 18:00:00
  Filled 2011-09-14 (×3): qty 1

## 2011-09-14 MED ORDER — MORPHINE SULFATE 2 MG/ML IJ SOLN
2.0000 mg | INTRAMUSCULAR | Status: DC | PRN
  Administered 2011-09-14: 2 mg via INTRAVENOUS

## 2011-09-14 MED ORDER — AMLODIPINE BESYLATE 2.5 MG PO TABS
2.5000 mg | ORAL_TABLET | Freq: Every day | ORAL | Status: DC
  Administered 2011-09-14: 2.5 mg via ORAL
  Filled 2011-09-14 (×2): qty 1

## 2011-09-14 MED ORDER — WARFARIN SODIUM 2.5 MG PO TABS
2.5000 mg | ORAL_TABLET | Freq: Once | ORAL | Status: AC
  Administered 2011-09-14: 2.5 mg via ORAL
  Filled 2011-09-14: qty 1

## 2011-09-14 MED ORDER — BUTAMBEN-TETRACAINE-BENZOCAINE 2-2-14 % EX AERO
INHALATION_SPRAY | CUTANEOUS | Status: DC | PRN
  Administered 2011-09-14: 2 via TOPICAL

## 2011-09-14 MED ORDER — BUTAMBEN-TETRACAINE-BENZOCAINE 2-2-14 % EX AERO
INHALATION_SPRAY | CUTANEOUS | Status: AC
  Filled 2011-09-14: qty 56

## 2011-09-14 MED ORDER — FENTANYL CITRATE 0.05 MG/ML IJ SOLN
INTRAMUSCULAR | Status: DC | PRN
  Administered 2011-09-14 (×2): 25 ug via INTRAVENOUS

## 2011-09-14 NOTE — Progress Notes (Signed)
Rec'd a call from RN re: chest pain. The patient was complaining of chest pain and the RN called regarding nitroglycerin. His records were reviewed. He denied chest pain on admission and his cardiac enzymes were cycled and were negative. He is in atrial fibrillation with a rapid ventricular rate at times. The current plan is for TEE cardioversion. An INR was not done today so we will check one. His Coumadin will be continued with heparin if his INR is subtherapeutic. Medical therapy is indicated for chest pain at this time. If he continues to have chest pain after the cardioversion, an ischemic evaluation may be needed. We will follow him closely.

## 2011-09-14 NOTE — Progress Notes (Signed)
ANTICOAGULATION CONSULT NOTE - Initial Consult  Pharmacy Consult for Coumadin Indication: atrial fibrillation  No Known Allergies  Patient Measurements: Height: 6' (182.9 cm) Weight: 195 lb 3.2 oz (88.542 kg) (a scale) IBW/kg (Calculated) : 77.6   Vital Signs: Temp: 98.1 F (36.7 C) (02/28 0925) Temp src: Oral (02/28 0925) BP: 126/87 mmHg (02/28 1255) Pulse Rate: 104  (02/28 1255)  Labs:  Basename 09/14/11 1005 09/14/11 0345 09/13/11 2020 09/13/11 1015  HGB -- -- -- 10.6*  HCT -- -- -- 31.9*  PLT -- -- -- 173  APTT -- -- -- 54*  LABPROT 32.7* -- -- 35.9*  INR 3.13* -- -- 3.53*  HEPARINUNFRC -- -- -- --  CREATININE -- -- -- 1.29  CKTOTAL 39 39 44 --  CKMB 1.6 1.7 1.7 --  TROPONINI <0.30 <0.30 <0.30 --   Estimated Creatinine Clearance: 48.5 ml/min (by C-G formula based on Cr of 1.29).  Medical History: Past Medical History  Diagnosis Date  . HYPOTHYROIDISM 05/20/2007  . HYPERLIPIDEMIA 10/20/2008  . DEPRESSION 12/17/2007  . MITRAL REGURGITATION 10/20/2008  . HYPERTENSION 04/10/2007  . CORONARY ARTERY DISEASE 04/10/2007  . Atrial fibrillation 04/12/2007  . GERD 04/10/2007  . DIVERTICULOSIS, COLON 04/10/2007  . BENIGN PROSTATIC HYPERTROPHY 04/10/2007  . GYNECOMASTIA, UNILATERAL 03/23/2010  . SLEEP APNEA 05/20/2007  . WEAKNESS 11/09/2009  . CHEST PAIN 05/11/2009  . Hematoma of abdominal wall   . Clotting disorder     anticoag. therapy  . Mallory - Weiss tear     > 5 years ago  . Barrett's esophagus     Medications:  Scheduled:    . amiodarone  200 mg Oral BID  . amLODipine  2.5 mg Oral Daily  . clonazePAM  1 mg Oral QHS  . fentaNYL  250 mcg Intravenous Once  . levothyroxine  25 mcg Oral QAC breakfast  . metoprolol tartrate  25 mg Oral BID  . midazolam  10 mg Intravenous Once  . morphine      . nitroGLYCERIN      . pantoprazole  40 mg Oral BID  . prenatal multivitamin  1 tablet Oral Daily  . simvastatin  40 mg Oral q1800  . sodium chloride  3 mL Intravenous Q12H    . sodium chloride  3 mL Intravenous Q12H  . traZODone  150 mg Oral QHS  . DISCONTD: amLODipine  5 mg Oral Daily  . DISCONTD: Co Q 10  1 capsule Oral Daily  . DISCONTD: M-VIT  1 tablet Oral Daily   Home Coumadin dose: 2.5 mg tuesdays and saturdays, 5 mg all other days  Assessment: 84 YOM on chronic coumadin for afib. Likely for TEE DCCV today, INR slightly above goal this morning, trending down from yesterday 3.53. Last coumadin dose was on 2/26 prior to admission.   Goal of Therapy:  INR 2-3   Plan:  - Coumadin 2.5mg  po x 1 - f/u INR in am  Riki Rusk 09/14/2011,2:04 PM

## 2011-09-14 NOTE — H&P (View-Only) (Signed)
SUBJECTIVE:  No distress, breathing better but still very weak.   PHYSICAL EXAM Filed Vitals:   09/13/11 2158 09/13/11 2359 09/14/11 0200 09/14/11 0615  BP: 123/85 123/78 96/63 108/70  Pulse: 99 92 89 81  Temp:  98.3 F (36.8 C) 97.7 F (36.5 C) 97.5 F (36.4 C)  TempSrc:  Oral Oral Oral  Resp:  19 17 18   Height:      Weight:    88.542 kg (195 lb 3.2 oz)  SpO2:  95% 93% 94%   General:  No distress Lungs:  Clear Heart:  Irregular Abdomen:  Positive bowel sounds, no rebound no guarding Extremities:  No edema.  LABS: Lab Results  Component Value Date   CKTOTAL 39 09/14/2011   CKMB 1.7 09/14/2011   TROPONINI <0.30 09/14/2011   Results for orders placed during the hospital encounter of 09/13/11 (from the past 24 hour(s))  CBC     Status: Abnormal   Collection Time   09/13/11 10:15 AM      Component Value Range   WBC 5.6  4.0 - 10.5 (K/uL)   RBC 3.55 (*) 4.22 - 5.81 (MIL/uL)   Hemoglobin 10.6 (*) 13.0 - 17.0 (g/dL)   HCT 16.1 (*) 09.6 - 52.0 (%)   MCV 89.9  78.0 - 100.0 (fL)   MCH 29.9  26.0 - 34.0 (pg)   MCHC 33.2  30.0 - 36.0 (g/dL)   RDW 04.5  40.9 - 81.1 (%)   Platelets 173  150 - 400 (K/uL)  DIFFERENTIAL     Status: Normal   Collection Time   09/13/11 10:15 AM      Component Value Range   Neutrophils Relative 61  43 - 77 (%)   Neutro Abs 3.4  1.7 - 7.7 (K/uL)   Lymphocytes Relative 27  12 - 46 (%)   Lymphs Abs 1.5  0.7 - 4.0 (K/uL)   Monocytes Relative 10  3 - 12 (%)   Monocytes Absolute 0.5  0.1 - 1.0 (K/uL)   Eosinophils Relative 2  0 - 5 (%)   Eosinophils Absolute 0.1  0.0 - 0.7 (K/uL)   Basophils Relative 0  0 - 1 (%)   Basophils Absolute 0.0  0.0 - 0.1 (K/uL)  BASIC METABOLIC PANEL     Status: Abnormal   Collection Time   09/13/11 10:15 AM      Component Value Range   Sodium 143  135 - 145 (mEq/L)   Potassium 4.1  3.5 - 5.1 (mEq/L)   Chloride 109  96 - 112 (mEq/L)   CO2 26  19 - 32 (mEq/L)   Glucose, Bld 85  70 - 99 (mg/dL)   BUN 23  6 - 23  (mg/dL)   Creatinine, Ser 9.14  0.50 - 1.35 (mg/dL)   Calcium 8.9  8.4 - 78.2 (mg/dL)   GFR calc non Af Amer 50 (*) >90 (mL/min)   GFR calc Af Amer 58 (*) >90 (mL/min)  CARDIAC PANEL(CRET KIN+CKTOT+MB+TROPI)     Status: Normal   Collection Time   09/13/11 10:15 AM      Component Value Range   Total CK 39  7 - 232 (U/L)   CK, MB 1.8  0.3 - 4.0 (ng/mL)   Troponin I <0.30  <0.30 (ng/mL)   Relative Index RELATIVE INDEX IS INVALID  0.0 - 2.5   APTT     Status: Abnormal   Collection Time   09/13/11 10:15 AM      Component Value  Range   aPTT 54 (*) 24 - 37 (seconds)  PROTIME-INR     Status: Abnormal   Collection Time   09/13/11 10:15 AM      Component Value Range   Prothrombin Time 35.9 (*) 11.6 - 15.2 (seconds)   INR 3.53 (*) 0.00 - 1.49   PRO B NATRIURETIC PEPTIDE     Status: Abnormal   Collection Time   09/13/11 11:58 AM      Component Value Range   Pro B Natriuretic peptide (BNP) 775.0 (*) 0 - 450 (pg/mL)  TSH     Status: Abnormal   Collection Time   09/13/11  8:20 PM      Component Value Range   TSH 5.693 (*) 0.350 - 4.500 (uIU/mL)  CARDIAC PANEL(CRET KIN+CKTOT+MB+TROPI)     Status: Normal   Collection Time   09/13/11  8:20 PM      Component Value Range   Total CK 44  7 - 232 (U/L)   CK, MB 1.7  0.3 - 4.0 (ng/mL)   Troponin I <0.30  <0.30 (ng/mL)   Relative Index RELATIVE INDEX IS INVALID  0.0 - 2.5   GLUCOSE, CAPILLARY     Status: Abnormal   Collection Time   09/13/11  8:40 PM      Component Value Range   Glucose-Capillary 103 (*) 70 - 99 (mg/dL)  CARDIAC PANEL(CRET KIN+CKTOT+MB+TROPI)     Status: Normal   Collection Time   09/14/11  3:45 AM      Component Value Range   Total CK 39  7 - 232 (U/L)   CK, MB 1.7  0.3 - 4.0 (ng/mL)   Troponin I <0.30  <0.30 (ng/mL)   Relative Index RELATIVE INDEX IS INVALID  0.0 - 2.5     Intake/Output Summary (Last 24 hours) at 09/14/11 4098 Last data filed at 09/14/11 0500  Gross per 24 hour  Intake     60 ml  Output      0 ml  Net      60 ml    ASSESSMENT AND PLAN:  1)  Atrial fibrillation:  For TEE DCCV today.  INR is therapeutic.  He is back on amiodarone.  Note TSH is very slightly elevated.  I will check T3/T4  2)  Hypotension:  I am going to reduce the Norvasc  3)  Diastolic HF:  Continue current therapies  4)  Fatigue:  This has been a chronic problem and I suspect multifactorial.  Continue plans as above.   Rollene Rotunda 09/14/2011 6:57 AM

## 2011-09-14 NOTE — Progress Notes (Signed)
 SUBJECTIVE:  No distress, breathing better but still very weak.   PHYSICAL EXAM Filed Vitals:   09/13/11 2158 09/13/11 2359 09/14/11 0200 09/14/11 0615  BP: 123/85 123/78 96/63 108/70  Pulse: 99 92 89 81  Temp:  98.3 F (36.8 C) 97.7 F (36.5 C) 97.5 F (36.4 C)  TempSrc:  Oral Oral Oral  Resp:  19 17 18  Height:      Weight:    88.542 kg (195 lb 3.2 oz)  SpO2:  95% 93% 94%   General:  No distress Lungs:  Clear Heart:  Irregular Abdomen:  Positive bowel sounds, no rebound no guarding Extremities:  No edema.  LABS: Lab Results  Component Value Date   CKTOTAL 39 09/14/2011   CKMB 1.7 09/14/2011   TROPONINI <0.30 09/14/2011   Results for orders placed during the hospital encounter of 09/13/11 (from the past 24 hour(s))  CBC     Status: Abnormal   Collection Time   09/13/11 10:15 AM      Component Value Range   WBC 5.6  4.0 - 10.5 (K/uL)   RBC 3.55 (*) 4.22 - 5.81 (MIL/uL)   Hemoglobin 10.6 (*) 13.0 - 17.0 (g/dL)   HCT 31.9 (*) 39.0 - 52.0 (%)   MCV 89.9  78.0 - 100.0 (fL)   MCH 29.9  26.0 - 34.0 (pg)   MCHC 33.2  30.0 - 36.0 (g/dL)   RDW 15.2  11.5 - 15.5 (%)   Platelets 173  150 - 400 (K/uL)  DIFFERENTIAL     Status: Normal   Collection Time   09/13/11 10:15 AM      Component Value Range   Neutrophils Relative 61  43 - 77 (%)   Neutro Abs 3.4  1.7 - 7.7 (K/uL)   Lymphocytes Relative 27  12 - 46 (%)   Lymphs Abs 1.5  0.7 - 4.0 (K/uL)   Monocytes Relative 10  3 - 12 (%)   Monocytes Absolute 0.5  0.1 - 1.0 (K/uL)   Eosinophils Relative 2  0 - 5 (%)   Eosinophils Absolute 0.1  0.0 - 0.7 (K/uL)   Basophils Relative 0  0 - 1 (%)   Basophils Absolute 0.0  0.0 - 0.1 (K/uL)  BASIC METABOLIC PANEL     Status: Abnormal   Collection Time   09/13/11 10:15 AM      Component Value Range   Sodium 143  135 - 145 (mEq/L)   Potassium 4.1  3.5 - 5.1 (mEq/L)   Chloride 109  96 - 112 (mEq/L)   CO2 26  19 - 32 (mEq/L)   Glucose, Bld 85  70 - 99 (mg/dL)   BUN 23  6 - 23  (mg/dL)   Creatinine, Ser 1.29  0.50 - 1.35 (mg/dL)   Calcium 8.9  8.4 - 10.5 (mg/dL)   GFR calc non Af Amer 50 (*) >90 (mL/min)   GFR calc Af Amer 58 (*) >90 (mL/min)  CARDIAC PANEL(CRET KIN+CKTOT+MB+TROPI)     Status: Normal   Collection Time   09/13/11 10:15 AM      Component Value Range   Total CK 39  7 - 232 (U/L)   CK, MB 1.8  0.3 - 4.0 (ng/mL)   Troponin I <0.30  <0.30 (ng/mL)   Relative Index RELATIVE INDEX IS INVALID  0.0 - 2.5   APTT     Status: Abnormal   Collection Time   09/13/11 10:15 AM      Component Value   Range   aPTT 54 (*) 24 - 37 (seconds)  PROTIME-INR     Status: Abnormal   Collection Time   09/13/11 10:15 AM      Component Value Range   Prothrombin Time 35.9 (*) 11.6 - 15.2 (seconds)   INR 3.53 (*) 0.00 - 1.49   PRO B NATRIURETIC PEPTIDE     Status: Abnormal   Collection Time   09/13/11 11:58 AM      Component Value Range   Pro B Natriuretic peptide (BNP) 775.0 (*) 0 - 450 (pg/mL)  TSH     Status: Abnormal   Collection Time   09/13/11  8:20 PM      Component Value Range   TSH 5.693 (*) 0.350 - 4.500 (uIU/mL)  CARDIAC PANEL(CRET KIN+CKTOT+MB+TROPI)     Status: Normal   Collection Time   09/13/11  8:20 PM      Component Value Range   Total CK 44  7 - 232 (U/L)   CK, MB 1.7  0.3 - 4.0 (ng/mL)   Troponin I <0.30  <0.30 (ng/mL)   Relative Index RELATIVE INDEX IS INVALID  0.0 - 2.5   GLUCOSE, CAPILLARY     Status: Abnormal   Collection Time   09/13/11  8:40 PM      Component Value Range   Glucose-Capillary 103 (*) 70 - 99 (mg/dL)  CARDIAC PANEL(CRET KIN+CKTOT+MB+TROPI)     Status: Normal   Collection Time   09/14/11  3:45 AM      Component Value Range   Total CK 39  7 - 232 (U/L)   CK, MB 1.7  0.3 - 4.0 (ng/mL)   Troponin I <0.30  <0.30 (ng/mL)   Relative Index RELATIVE INDEX IS INVALID  0.0 - 2.5     Intake/Output Summary (Last 24 hours) at 09/14/11 0657 Last data filed at 09/14/11 0500  Gross per 24 hour  Intake     60 ml  Output      0 ml  Net      60 ml    ASSESSMENT AND PLAN:  1)  Atrial fibrillation:  For TEE DCCV today.  INR is therapeutic.  He is back on amiodarone.  Note TSH is very slightly elevated.  I will check T3/T4  2)  Hypotension:  I am going to reduce the Norvasc  3)  Diastolic HF:  Continue current therapies  4)  Fatigue:  This has been a chronic problem and I suspect multifactorial.  Continue plans as above.   Sava Proby 09/14/2011 6:57 AM   

## 2011-09-14 NOTE — Progress Notes (Signed)
Nursing Note: Pt complaining of chest pain. Pt received one sublingual nitro for chest pain 8 out of 10. Blood pressure 123/85 heart rate 85.EKG done AFIB no acute changes . Pt's blood pressure 95/61 heart rate 101 after first nitro administration. Pt still complains of chest pain 8 out of 10. MD made aware. MD stated to give 2 mg of Morphine IV for for chest pain. Morphine 2 mg given. Pt put on 2 liters of oxygen. Blood pressure 111/78 after morphine administration.  Pt stated "I am still in pain but it is not as bad as it was". Will continue to monitor pt.Babe Clenney Scientist, clinical (histocompatibility and immunogenetics).

## 2011-09-14 NOTE — Significant Event (Signed)
Patient arrived on unit approximately 1940. Shortly after that, he went to the bathroom and was there for a while. Upon getting back to bed, he c/o chest pain. Patient had been given nitroglycerin at emergency dept. @ 1636. Did a stat EKG and called provider on-call for [facility] or Dr. Tonny Bollman, [facility], [place]).  Provider stated to give 1 tab of nitroglycerin. If patient's pain subsides after that, no intervention needed, just continue to monitor patient. However, if pain still persists, then contact fellow in-house @ and have he/she come and assess patient and EKG done. Will continue to monitor patient.

## 2011-09-14 NOTE — Progress Notes (Signed)
Nursing Note: Pt arrived to room back from cardioversion. Pt Sinus Brady at a rate of 52. Pt is lying in bed with no complaints at this time. Call bell within reach. Will continue to monitor pt. Roshana Shuffield Scientist, clinical (histocompatibility and immunogenetics).

## 2011-09-14 NOTE — Interval H&P Note (Signed)
History and Physical Interval Note:  09/14/2011 3:23 PM  [name]  has presented today for surgery, with the diagnosis of afib  The various methods of treatment have been discussed with the patient and family. After consideration of risks, benefits and other options for treatment, the patient has consented to  Procedure(s) (LRB): TRANSESOPHAGEAL ECHOCARDIOGRAM (TEE) (N/A) CARDIOVERSION (N/A) as a surgical intervention .  The patients' history has been reviewed, patient examined, no change in status, stable for surgery.  I have reviewed the patients' chart and labs.  Questions were answered to the patient's satisfaction.     Charlton Haws  3:23 PM 09/14/2011

## 2011-09-14 NOTE — Progress Notes (Signed)
Nursing Notes: Pt complaining of chest pain at 0945, pain level 5 out of 10. Blood pressure 121/77 heart rate 104. MD made aware. Orders received to give Nitro as needed for chest pain, Coumadin per pharmacy and stat INR. Pt given one Nitro sublingual, at 0950 pt reports pain level of 0 out of 10. Blood pressure 90/63 heart rate 94. Pt lying in bed with no other complaints at this time. Son at bedside, call bell within reach. Will continue to monitor pt. Carnell Beavers Scientist, clinical (histocompatibility and immunogenetics).

## 2011-09-14 NOTE — Progress Notes (Signed)
  Echocardiogram Echocardiogram Transesophageal has been performed.  Jorje Guild [facility] 09/14/2011, 3:50 PM

## 2011-09-14 NOTE — Op Note (Signed)
Transesophageal Echocardiogram: Indication: Afib Sedation: Versed: 5, Fentanyl: 50, Other: 0 ASA: 2, Airway: 2  Procedure:  The patient was moderately sedated with the above doses of versed and fentanyl.  Using digital technique an omniplane probe was advanced into the distal esophagus without incident. Transgastric imaging revealed normal LV function with no RWMA;s and no mural apical thrombus.  Estimated ejection fraction was 55%.  Right sided cardiac chambers were normal with no evidence of pulmonary hypertension.  The pulmonary and tricuspid valves were structurally normal.  There was mild TR The mitral valve was structurally normal with mild to moderate mitral regurgitation.  The aortic valve was trileaflet with trivial AR The aortic root was normal.    There was moderate biatrial enlargement.  Imaging of the septum showed no ASD or VSD Bubble study was negative for shunt 2D and color flow confirmed no PFO  The LAE was well visualized in orthogonal views.  There was no spontaneous contrast and no thrombus.    The descending thoracic aorta had no  mural aortic debris with no evidence of aneurysmal dilation or disection  Impression:  1) No LAA thrombus 2) Moderate biatrial enlargement 3) Normal EF 55% 4) Mild to moderate MR   Cardioversion:  The patient was well sedated.  A single biphasic 200J shock was delivered He converted to NSR at a rate of 56  Impression:  Successful TEE/DCC   [name] 09/14/2011 3:32 PM

## 2011-09-14 NOTE — Brief Op Note (Signed)
See opertative note [name]  

## 2011-09-15 ENCOUNTER — Encounter (HOSPITAL_COMMUNITY): Payer: Self-pay | Admitting: Cardiovascular Disease

## 2011-09-15 ENCOUNTER — Ambulatory Visit: Payer: Medicare Other | Admitting: Cardiology

## 2011-09-15 DIAGNOSIS — I4891 Unspecified atrial fibrillation: Secondary | ICD-10-CM | POA: Diagnosis not present

## 2011-09-15 LAB — BASIC METABOLIC PANEL
BUN: 28 mg/dL — ABNORMAL HIGH (ref 6–23)
Calcium: 8.8 mg/dL (ref 8.4–10.5)
Creatinine, Ser: 2.06 mg/dL — ABNORMAL HIGH (ref 0.50–1.35)
GFR calc Af Amer: 33 mL/min — ABNORMAL LOW (ref >90)
GFR calc non Af Amer: 28 mL/min — ABNORMAL LOW (ref >90)
Glucose, Bld: 90 mg/dL (ref 70–99)

## 2011-09-15 LAB — CORTISOL-AM, BLOOD: Cortisol - AM: 6.6 ug/dL (ref 4.3–22.4)

## 2011-09-15 MED ORDER — SIMVASTATIN 20 MG PO TABS
20.0000 mg | ORAL_TABLET | Freq: Every day | ORAL | Status: DC
  Administered 2011-09-15: 20 mg via ORAL
  Filled 2011-09-15 (×2): qty 1

## 2011-09-15 MED ORDER — WARFARIN SODIUM 2.5 MG PO TABS
2.5000 mg | ORAL_TABLET | Freq: Once | ORAL | Status: AC
  Administered 2011-09-15: 2.5 mg via ORAL
  Filled 2011-09-15: qty 1

## 2011-09-15 NOTE — Progress Notes (Signed)
ANTICOAGULATION CONSULT NOTE - Initial Consult  Pharmacy Consult for Coumadin Indication: atrial fibrillation  No Known Allergies  Patient Measurements: Height: 6' (182.9 cm) Weight: 197 lb 1.5 oz (89.4 kg) (a scale) IBW/kg (Calculated) : 77.6   Vital Signs: Temp: 97.9 F (36.6 C) (03/01 0532) Temp src: Oral (02/28 2143) BP: 93/49 mmHg (03/01 0532) Pulse Rate: 53  (03/01 0532)  Labs:  Basename 09/15/11 0600 09/14/11 1005 09/14/11 0345 09/13/11 2020 09/13/11 1015  HGB -- -- -- -- 10.6*  HCT -- -- -- -- 31.9*  PLT -- -- -- -- 173  APTT -- -- -- -- 54*  LABPROT 29.9* 32.7* -- -- 35.9*  INR 2.79* 3.13* -- -- 3.53*  HEPARINUNFRC -- -- -- -- --  CREATININE 2.06* -- -- -- 1.29  CKTOTAL -- 39 39 44 --  CKMB -- 1.6 1.7 1.7 --  TROPONINI -- <0.30 <0.30 <0.30 --   Estimated Creatinine Clearance: 30.3 ml/min (by C-G formula based on Cr of 2.06).  Medical History: Past Medical History  Diagnosis Date  . HYPOTHYROIDISM 05/20/2007  . HYPERLIPIDEMIA 10/20/2008  . DEPRESSION 12/17/2007  . MITRAL REGURGITATION 10/20/2008  . HYPERTENSION 04/10/2007  . CORONARY ARTERY DISEASE 04/10/2007  . Atrial fibrillation 04/12/2007  . GERD 04/10/2007  . DIVERTICULOSIS, COLON 04/10/2007  . BENIGN PROSTATIC HYPERTROPHY 04/10/2007  . GYNECOMASTIA, UNILATERAL 03/23/2010  . SLEEP APNEA 05/20/2007  . WEAKNESS 11/09/2009  . CHEST PAIN 05/11/2009  . Hematoma of abdominal wall   . Clotting disorder     anticoag. therapy  . Mallory - Weiss tear     > 5 years ago  . Barrett's esophagus     Medications:  Scheduled:     . amiodarone  200 mg Oral BID  . clonazePAM  1 mg Oral QHS  . levothyroxine  25 mcg Oral QAC breakfast  . morphine      . nitroGLYCERIN      . pantoprazole  40 mg Oral BID  . prenatal multivitamin  1 tablet Oral Daily  . simvastatin  20 mg Oral q1800  . sodium chloride  3 mL Intravenous Q12H  . traZODone  150 mg Oral QHS  . warfarin  2.5 mg Oral ONCE-1800  . Warfarin - Pharmacist Dosing  Inpatient   Does not apply q1800  . DISCONTD: amLODipine  2.5 mg Oral Daily  . DISCONTD: fentaNYL  250 mcg Intravenous Once  . DISCONTD: metoprolol tartrate  25 mg Oral BID  . DISCONTD: midazolam  10 mg Intravenous Once  . DISCONTD: simvastatin  40 mg Oral q1800  . DISCONTD: sodium chloride  3 mL Intravenous Q12H  . DISCONTD: sodium chloride  3 mL Intravenous Q12H   Admit Complaint: weakness and atrial fibrillation Home Coumadin dose (started 2/15): 2.5 mg tuesdays and saturdays, 5 mg all other days  Assessment: 84 YOM on chronic coumadin for afib. TEE DCCV 2/28. INR now in goal at 2.79  Goal of Therapy:  INR 2-3   Plan:  - Coumadin 2.5mg  po x 1 - f/u INR in am  Misty Stanley Stillinger 09/15/2011,8:36 AM

## 2011-09-15 NOTE — Progress Notes (Signed)
    SUBJECTIVE:  No further chest pain.  Breathing better.  Feels better in NSR   PHYSICAL EXAM Filed Vitals:   09/14/11 1615 09/14/11 2143 09/15/11 0223 09/15/11 0532  BP: 105/59 93/53 101/62 93/49  Pulse:  60 72 53  Temp:  97.5 F (36.4 C) 97.9 F (36.6 C) 97.9 F (36.6 C)  TempSrc:  Oral    Resp: 31 18 20 20   Height:      Weight:    197 lb 1.5 oz (89.4 kg)  SpO2: 97% 97% 98% 99%   General:  No distress Lungs:  Clear Heart:  Regular Abdomen:  Positive bowel sounds, no rebound no guarding Extremities:  No edema.  LABS: Lab Results  Component Value Date   CKTOTAL 39 09/14/2011   CKMB 1.6 09/14/2011   TROPONINI <0.30 09/14/2011   Results for orders placed during the hospital encounter of 09/13/11 (from the past 24 hour(s))  CARDIAC PANEL(CRET KIN+CKTOT+MB+TROPI)     Status: Normal   Collection Time   09/14/11 10:05 AM      Component Value Range   Total CK 39  7 - 232 (U/L)   CK, MB 1.6  0.3 - 4.0 (ng/mL)   Troponin I <0.30  <0.30 (ng/mL)   Relative Index RELATIVE INDEX IS INVALID  0.0 - 2.5   T4, FREE     Status: Normal   Collection Time   09/14/11 10:05 AM      Component Value Range   Free T4 1.41  0.80 - 1.80 (ng/dL)  T3, FREE     Status: Normal   Collection Time   09/14/11 10:05 AM      Component Value Range   T3, Free 2.6  2.3 - 4.2 (pg/mL)  PROTIME-INR     Status: Abnormal   Collection Time   09/14/11 10:05 AM      Component Value Range   Prothrombin Time 32.7 (*) 11.6 - 15.2 (seconds)   INR 3.13 (*) 0.00 - 1.49     Intake/Output Summary (Last 24 hours) at 09/15/11 4540 Last data filed at 09/14/11 2251  Gross per 24 hour  Intake  578.5 ml  Output    350 ml  Net  228.5 ml    ASSESSMENT AND PLAN:  1)  Atrial fibrillation:  TEE DCCV yesterday.  INR is therapeutic.  He is back on amiodarone.  Note TSH is very slightly elevated.  T3/T4 normal.  2)  Hypotension:  I reduced the Norvasc yesterday but I will stop it completely to day since his BP is still  running low.  3)  Diastolic HF:  Continue current therapies  4)  Fatigue:  This has been a chronic problem and I suspect multifactorial.  Continue plans as above.  I will check a cortisol level.     Fayrene Fearing [facility] 09/15/2011 6:47 AM

## 2011-09-15 NOTE — Progress Notes (Signed)
09/15/11 1445 UR Completed. Tera Mater, RN, BSN [phone]

## 2011-09-15 NOTE — Progress Notes (Signed)
Walked hallway with patient, patient stated that he felt weak but did walk from room to soiled utility room and back to the end of the hallway and then his room, patient VS are stable and patient is currently back in his bed resting__D. Manson Passey RN

## 2011-09-15 NOTE — Clinical Documentation Improvement (Signed)
RENAL FAILURE DOCUMENTATION CLARIFICATION QUERY  THIS DOCUMENT IS NOT A PERMANENT PART OF THE MEDICAL RECORD  Please update your documentation within the medical record to reflect your response to this query.                                                                                    09/15/11  Dr. Antoine Poche and/or Associates,  In a better effort to capture your patient's severity of illness, reflect appropriate length of stay and utilization of resources, a review of the patient medical record has revealed the following indicators.    Based on your clinical judgment, please document in the progress notes and discharge summary if a condition below provides greater specificity regarding the patient's renal status:   - Acute Kidney Injury  (AKI)   - Acute Renal Failure   - Other Condition   - Unable to Clinically Determine  Clinical Information:  BUN/CR/GFR this admission     (WM) 23/1.29/50 28/2.06/28  Lasix 20mg  IV x 1 given on admission for Acute on Chronic Diastolic Heart Failure (per H&P)  S/P TEE and DCCV 09/14/11   In responding to this query please exercise your independent judgment.  The fact that a query is asked, does not imply that any particular answer is desired or expected.   Reviewed:  no additional documentation provided.  He does not have any of these conditions.  Thank You,  Jerral Ralph  RN BSN Certified Clinical Documentation Specialist: Cell   [phone]  Health Information Management [place]   TO RESPOND TO THE THIS QUERY, FOLLOW THE INSTRUCTIONS BELOW:  1. If needed, update documentation for the patient's encounter via the notes activity.  2. Access this query again and click edit on the In [employer].  3. After updating, or not, click F2 to complete all highlighted (required) fields concerning your review. Select "additional documentation in the medical record" OR "no additional documentation provided".  4. Click Sign note  button.  5. The deficiency will fall out of your In Basket *Please let us know if you are not able to complete this workflow by phone or e-mail (listed below).

## 2011-09-16 DIAGNOSIS — I4891 Unspecified atrial fibrillation: Secondary | ICD-10-CM | POA: Diagnosis not present

## 2011-09-16 LAB — BASIC METABOLIC PANEL
CO2: 26 mEq/L (ref 19–32)
Chloride: 106 mEq/L (ref 96–112)
GFR calc non Af Amer: 41 mL/min — ABNORMAL LOW (ref >90)
Glucose, Bld: 100 mg/dL — ABNORMAL HIGH (ref 70–99)
Potassium: 4.1 mEq/L (ref 3.5–5.1)
Sodium: 139 mEq/L (ref 135–145)

## 2011-09-16 MED ORDER — WARFARIN SODIUM 5 MG PO TABS: 5.0000 mg | ORAL_TABLET | Freq: Every day | ORAL | Status: DC

## 2011-09-16 MED ORDER — WARFARIN SODIUM 2.5 MG PO TABS
2.5000 mg | ORAL_TABLET | Freq: Once | ORAL | Status: DC
Start: 2011-09-16 — End: 2011-09-16
  Filled 2011-09-16: qty 1

## 2011-09-16 NOTE — Progress Notes (Signed)
ANTICOAGULATION CONSULT NOTE - Initial Consult  Pharmacy Consult for Coumadin Indication: atrial fibrillation  Assessment: 78 yo male on coumadin PTA for afib s/p TEE DCCV 2/28, patient continues to be in NSR. INR today remains therapeutic at 2.64, no bleeding issues reported.  Home Coumadin dose (started 2/15): 2.5 mg tuesdays and saturdays, 5 mg all other days  Pharmacy System-Based Medication Review: Cardiovascular: HLD/HTN/CAD/afib, chest pain, but CEs neg x4. EF 55% with DHF. BP stable at 115/72 with HR 50s. s/pDCCV 2/28. MD notes NSR this am. Home BP meds stopped d/t hypotension, currently on amiodarone & simvastatin Endocrinology: hypothyroid, resumed home synthroid, TSH slightly elevated (5.69) Renal: SCr improved to 2.64 -->3.13 with UOP 0.19mL/kg/hr Best Practices: coumadin, PPI, home meds addressed Disposition: Per [place], likely home today if renal function stable  Goal of Therapy:  INR 2-3  Plan:  - Coumadin 2.5mg  PO x1 if patient still here this evening - Check INR in AM  Bayard Beaver. Saul Fordyce, PharmD Pager: [phone]  09/16/2011 11:41 AM   No Known Allergies  Patient Measurements: Height: 6' (182.9 cm) Weight: 197 lb 11.2 oz (89.676 kg) (Scale A) IBW/kg (Calculated) : 77.6   Vital Signs: Temp: 97.6 F (36.4 C) (03/02 0456) Temp src: Oral (03/02 0456) BP: 115/72 mmHg (03/02 1036) Pulse Rate: 58  (03/02 1036)  Labs:  Basename 09/16/11 0600 09/15/11 0600 09/14/11 1005 09/14/11 0345 09/13/11 2020  HGB -- -- -- -- --  HCT -- -- -- -- --  PLT -- -- -- -- --  APTT -- -- -- -- --  LABPROT 28.6* 29.9* 32.7* -- --  INR 2.64* 2.79* 3.13* -- --  HEPARINUNFRC -- -- -- -- --  CREATININE -- 2.06* -- -- --  CKTOTAL -- -- 39 39 44  CKMB -- -- 1.6 1.7 1.7  TROPONINI -- -- <0.30 <0.30 <0.30   Estimated Creatinine Clearance: 30.3 ml/min (by C-G formula based on Cr of 2.06).  Medical History: Past Medical History  Diagnosis Date  . HYPOTHYROIDISM 05/20/2007  .  HYPERLIPIDEMIA 10/20/2008  . DEPRESSION 12/17/2007  . MITRAL REGURGITATION 10/20/2008  . HYPERTENSION 04/10/2007  . CORONARY ARTERY DISEASE 04/10/2007  . Atrial fibrillation 04/12/2007  . GERD 04/10/2007  . DIVERTICULOSIS, COLON 04/10/2007  . BENIGN PROSTATIC HYPERTROPHY 04/10/2007  . GYNECOMASTIA, UNILATERAL 03/23/2010  . SLEEP APNEA 05/20/2007  . WEAKNESS 11/09/2009  . CHEST PAIN 05/11/2009  . Hematoma of abdominal wall   . Clotting disorder     anticoag. therapy  . Mallory - Weiss tear     > 5 years ago  . Barrett's esophagus     Medications:  Scheduled:     . amiodarone  200 mg Oral BID  . clonazePAM  1 mg Oral QHS  . levothyroxine  25 mcg Oral QAC breakfast  . pantoprazole  40 mg Oral BID  . prenatal multivitamin  1 tablet Oral Daily  . simvastatin  20 mg Oral q1800  . sodium chloride  3 mL Intravenous Q12H  . traZODone  150 mg Oral QHS  . warfarin  2.5 mg Oral ONCE-1800  . Warfarin - Pharmacist Dosing Inpatient   Does not apply q1800   Admit Complaint: weakness and atrial fibrillation Home Coumadin dose (started 2/15): 2.5 mg tuesdays and saturdays, 5 mg all other days  Assessment: 84 YOM on chronic coumadin for afib. TEE DCCV 2/28. INR now in goal at 2.79  Goal of Therapy:  INR 2-3   Plan:  - Coumadin 2.5mg  po x 1 -  f/u INR in am  Ival Bible 09/16/2011,11:33 AM

## 2011-09-16 NOTE — Progress Notes (Signed)
Discharge instruction given to pt and spouse verbalized understanding . Wheeled to lobby by NT for d/c

## 2011-09-16 NOTE — Progress Notes (Signed)
    SUBJECTIVE:  No further chest pain.  Complains of some dyspnea and fatigue.   PHYSICAL EXAM Filed Vitals:   09/15/11 1757 09/15/11 2115 09/16/11 0456 09/16/11 1036  BP: 107/61 117/75 112/69 115/72  Pulse: 61 69 58 58  Temp: 97.4 F (36.3 C) 97 F (36.1 C) 97.6 F (36.4 C)   TempSrc: Oral Oral Oral   Resp: 18 18 17 16   Height:      Weight:   197 lb 11.2 oz (89.676 kg)   SpO2: 100% 99% 95% 97%   General:  No distress Lungs:  Clear Heart:  Regular Abdomen:  Positive bowel sounds, no rebound no guarding Extremities:  No edema.  LABS: Lab Results  Component Value Date   CKTOTAL 39 09/14/2011   CKMB 1.6 09/14/2011   TROPONINI <0.30 09/14/2011   Results for orders placed during the hospital encounter of 09/13/11 (from the past 24 hour(s))  PROTIME-INR     Status: Abnormal   Collection Time   09/16/11  6:00 AM      Component Value Range   Prothrombin Time 28.6 (*) 11.6 - 15.2 (seconds)   INR 2.64 (*) 0.00 - 1.49     Intake/Output Summary (Last 24 hours) at 09/16/11 1121 Last data filed at 09/16/11 1000  Gross per 24 hour  Intake    980 ml  Output    900 ml  Net     80 ml    ASSESSMENT AND PLAN:  1)  Atrial fibrillation:  TEE DCCV; remains in sinus.  INR is therapeutic.  He is back on amiodarone.  Note TSH is very slightly elevated.  T3/T4 normal.  2)  Hypotension:  BP improved; continue off norvasc  3)  Diastolic HF:  Continue current therapies  4)  Fatigue:  This has been a chronic problem.  5) Acute renal insuff - recheck BMET today; if renal function stable, DC home and fu with Dr Antoine Poche 2-4 weeks >30 min PA and physician time D2   [name] 09/16/2011 11:21 AM

## 2011-09-16 NOTE — Discharge Summary (Signed)
See progress notes Tameaka Eichhorn  

## 2011-09-16 NOTE — Discharge Summary (Signed)
Physician Discharge Summary  Patient ID: [name] MRN: [id number] DOB/AGE: 1926/09/17 78 y.o.  Admit date: 09/13/2011 Discharge date: 09/16/2011  Primary Cardiologist: Rollene Rotunda, MD   Primary Discharge Diagnosis: 1 Atrial fibrillation  - s/p successful TEE DCCV  2 Hypotension  3 Acute renal insufficiency  4 Acute/chronic diastolic heart failure, mild   Secondary Discharge Diagnoses: Past Medical History  Diagnosis Date  . HYPOTHYROIDISM 05/20/2007  . HYPERLIPIDEMIA 10/20/2008  . DEPRESSION 12/17/2007  . MITRAL REGURGITATION 10/20/2008  . HYPERTENSION 04/10/2007  . CORONARY ARTERY DISEASE 04/10/2007  . Atrial fibrillation 04/12/2007  . GERD 04/10/2007  . DIVERTICULOSIS, COLON 04/10/2007  . BENIGN PROSTATIC HYPERTROPHY 04/10/2007  . GYNECOMASTIA, UNILATERAL 03/23/2010  . SLEEP APNEA 05/20/2007  . WEAKNESS 11/09/2009  . CHEST PAIN 05/11/2009  . Hematoma of abdominal wall   . Clotting disorder     anticoag. therapy  . Mallory - Weiss tear     > 5 years ago  . Barrett's esophagus     Procedures: TEE guided DC cardioversion  Hospital Course: Patient was admitted for treatment of symptomatic atrial fibrillation, status post multiple prior DCCVs, the last in 2008. He also had history of failing treatment with amiodarone, secondary to CP/weakness, and was placed on Tikosyn load, followed by DCCV in 2008. His symptoms appeared to be worsening, and he had documented recurrent AF since early February of this year. He was also felt to have mild A/C DHF, and was treated with IV Lasix.  Recommendation was for TEE DCCV, given that he had been therapeutic on Coumadin only since 2/15. Procedure was successfully performed on 2/28, by Dr. Eden Emms, with no noted complications. There was no LAA thrombus; mod BAE; mild/mod MR; and, NL LVF (EF 55%).  Medication adjustments notable for discontinuation of Norvasc, secondary to persistent hypotension.  He also developed acute renal sufficiency, as  compared to recent labs, but had improved renal function with a creatinine of 1.5, by time of discharge.  Patient remained therapeutic on Coumadin.  Discharge Vitals: Blood pressure 107/63, pulse 68, temperature 98.2 F (36.8 C), temperature source Oral, resp. rate 17, height 6' (1.829 m), weight 197 lb 11.2 oz (89.676 kg), SpO2 97.00%.  Labs: Lab Results  Component Value Date   WBC 5.6 09/13/2011   HGB 10.6* 09/13/2011   HCT 31.9* 09/13/2011   MCV 89.9 09/13/2011   PLT 173 09/13/2011      Lab 09/16/11 1215  NA 139  K 4.1  CL 106  CO2 26  BUN 29*  CREATININE 1.51*  CALCIUM 8.9  ALBUMIN --  PROT --  BILITOT --  ALKPHOS --  ALT --  AST --  GLUCOSE 100*    Lab Results  Component Value Date   CHOL 130 10/07/2008   HDL 61.10 10/07/2008   LDLCALC 63 10/07/2008   TRIG 32.0 10/07/2008    Lab Results  Component Value Date   DDIMER  Value: 0.81        AT THE INHOUSE ESTABLISHED CUTOFF VALUE OF 0.48 ug/mL FEU, THIS ASSAY HAS BEEN DOCUMENTED IN THE LITERATURE TO HAVE* 01/15/2007    Lab Results  Component Value Date   TSH 5.693* 09/13/2011     Basename 09/14/11 1005 09/14/11 0345 09/13/11 2020  CKTOTAL 39 39 44  CKMB 1.6 1.7 1.7  TROPONINI <0.30 <0.30 <0.30    Diagnostic Studies: Dg Chest Portable 1 View  09/13/2011  *RADIOLOGY REPORT*  Clinical Data: Palpitations.  Shortness of breath.  PORTABLE CHEST - 1 VIEW  Comparison: 11/20/2009.  Findings: Trachea is midline.  Heart is enlarged, stable.  There may be mild interstitial prominence, with a basilar predominance. Tiny left pleural effusion.  IMPRESSION: Suspect mild congestive heart failure.  Original Report Authenticated By: Reyes Ivan, M.D.     DISPOSITION: Stable condition  FOLLOW UP PLANS AND APPOINTMENTS: Discharge Orders    Future Appointments: Provider: Department: Dept Phone: Center:   11/22/2011 8:15 AM Rogelia Boga, MD [facility] [phone] [facility]     Future Orders Please Complete  By Expires   Diet - low sodium heart healthy      Increase activity slowly        Follow-up Information    Follow up with Rogelia Boga, MD in 2 weeks. (call for appointment)       Follow up with Rollene Rotunda, MD in 2 weeks. (office will arrange)    Contact information:   [address], [address] [place] [address] [phone]          DISCHARGE MEDICATIONS: Medication List  As of 09/16/2011  3:07 PM   STOP taking these medications         amLODipine 2.5 MG tablet      metoprolol tartrate 25 MG tablet         TAKE these medications         amiodarone 200 MG tablet   Commonly known as: PACERONE   Take 200 mg by mouth 2 (two) times daily.      clonazePAM 1 MG tablet   Commonly known as: KLONOPIN   Take 1 mg by mouth at bedtime.      Co Q 10 100 MG Caps   Take 1 capsule by mouth daily.      levothyroxine 25 MCG tablet   Commonly known as: SYNTHROID, LEVOTHROID   Take 25 mcg by mouth daily.      M-VIT tablet   Take 1 tablet by mouth daily.      nitroGLYCERIN 0.4 MG SL tablet   Commonly known as: NITROSTAT   Place 0.4 mg under the tongue every 5 (five) minutes as needed.      pantoprazole 40 MG tablet   Commonly known as: PROTONIX   Take 40 mg by mouth 2 (two) times daily.      pravastatin 40 MG tablet   Commonly known as: PRAVACHOL   Take 1 tablet (40 mg total) by mouth every evening.      traZODone 50 MG tablet   Commonly known as: DESYREL   Take 150 mg by mouth at bedtime. 1/2 pill at bedtime      warfarin 5 MG tablet   Commonly known as: COUMADIN   Take 1 tablet (5 mg total) by mouth daily.            BRING ALL MEDICATIONS WITH YOU TO FOLLOW UP APPOINTMENTS  Time spent with patient to include physician time: Greater than 30 minutes, including physician time.  Signed: Gene Aaira Oestreicher 09/16/2011, 3:07 PM Co-Sign MD

## 2011-09-19 ENCOUNTER — Ambulatory Visit (INDEPENDENT_AMBULATORY_CARE_PROVIDER_SITE_OTHER): Payer: Medicare Other

## 2011-09-19 ENCOUNTER — Encounter: Payer: Self-pay | Admitting: Cardiology

## 2011-09-19 ENCOUNTER — Ambulatory Visit (INDEPENDENT_AMBULATORY_CARE_PROVIDER_SITE_OTHER): Payer: Medicare Other | Admitting: Cardiology

## 2011-09-19 DIAGNOSIS — I4891 Unspecified atrial fibrillation: Secondary | ICD-10-CM

## 2011-09-19 DIAGNOSIS — I080 Rheumatic disorders of both mitral and aortic valves: Secondary | ICD-10-CM

## 2011-09-19 DIAGNOSIS — I10 Essential (primary) hypertension: Secondary | ICD-10-CM | POA: Diagnosis not present

## 2011-09-19 DIAGNOSIS — Z7901 Long term (current) use of anticoagulants: Secondary | ICD-10-CM | POA: Diagnosis not present

## 2011-09-19 DIAGNOSIS — I2510 Atherosclerotic heart disease of native coronary artery without angina pectoris: Secondary | ICD-10-CM | POA: Diagnosis not present

## 2011-09-19 NOTE — Patient Instructions (Signed)
Continue medications as listed.  In 2 weeks decrease your Amiodarone to 200 mg once a day  Please have lab work in 4 weeks. (BMP)  Follow up with Dr Antoine Poche in 6 weeks.

## 2011-09-19 NOTE — Assessment & Plan Note (Signed)
This was moderate at his recent TEE.  No change in therapy is indicated.

## 2011-09-19 NOTE — Progress Notes (Signed)
HPI Patient presents for followup of after a recent hospitalization for management of atrial fibrillation. He underwent TEE to cardioversion. He was having some chest discomfort but ruled out for myocardial infarction. His biggest complaint has been profound fatigue.  He did have some evidence of volume overload in the hospital. However, with minimal diuresis he bumped his creatinine. Since going home he has continued to have significant fatigue. He feels maybe slightly better than when he was in fibrillation. His heart rhythm has been regular and 60. Is having some dyspnea today. He's not having any chest pain previously. He's not describing PND or orthopnea. She's had no presyncope or syncope.  No Known Allergies  Current Outpatient Prescriptions  Medication Sig Dispense Refill  . amiodarone (PACERONE) 200 MG tablet Take 200 mg by mouth 2 (two) times daily.      . clonazePAM (KLONOPIN) 1 MG tablet Take 1 mg by mouth at bedtime.        . Coenzyme Q10 (CO Q 10) 100 MG CAPS Take 1 capsule by mouth daily.        [name] levothyroxine (SYNTHROID, LEVOTHROID) 25 MCG tablet Take 25 mcg by mouth daily.       . Multiple Vitamin (MULTIVITAMIN) capsule Take 1 capsule by mouth daily.      . nitroGLYCERIN (NITROSTAT) 0.4 MG SL tablet Place 0.4 mg under the tongue every 5 (five) minutes as needed.        . pantoprazole (PROTONIX) 40 MG tablet Take 40 mg by mouth 2 (two) times daily.        . pravastatin (PRAVACHOL) 40 MG tablet Take 1 tablet (40 mg total) by mouth every evening.      . Prenatal Vit-Fe Fumarate-FA (M-VIT) tablet Take 1 tablet by mouth daily.        . traZODone (DESYREL) 50 MG tablet Take 150 mg by mouth at bedtime. 1/2 pill at bedtime      . warfarin (COUMADIN) 5 MG tablet Take 1 tablet (5 mg total) by mouth daily.  30 tablet  3    Past Medical History  Diagnosis Date  . HYPOTHYROIDISM 05/20/2007  . HYPERLIPIDEMIA 10/20/2008  . DEPRESSION 12/17/2007  . MITRAL REGURGITATION 10/20/2008  .  HYPERTENSION 04/10/2007  . CORONARY ARTERY DISEASE 04/10/2007  . Atrial fibrillation 04/12/2007  . GERD 04/10/2007  . DIVERTICULOSIS, COLON 04/10/2007  . BENIGN PROSTATIC HYPERTROPHY 04/10/2007  . GYNECOMASTIA, UNILATERAL 03/23/2010  . SLEEP APNEA 05/20/2007  . WEAKNESS 11/09/2009  . CHEST PAIN 05/11/2009  . Hematoma of abdominal wall   . Clotting disorder     anticoag. therapy  . Mallory - Weiss tear     > 5 years ago  . Barrett's esophagus     Past Surgical History  Procedure Date  . Transurethral resection of prostate   . Coronary angioplasty with stent placement   . Tee with cardioversion 7/08  . Cardioversion 8/08  . Esophagogastroduodenoscopy   . Tee without cardioversion 09/14/2011    Procedure: TRANSESOPHAGEAL ECHOCARDIOGRAM (TEE);  Surgeon: Wendall Stade, MD;  Location: [facility] ENDOSCOPY;  Service: Cardiovascular;  Laterality: N/A;  . Cardioversion 09/14/2011    Procedure: CARDIOVERSION;  Surgeon: Wendall Stade, MD;  Location: [facility] ENDOSCOPY;  Service: Cardiovascular;  Laterality: N/A;    ROS:  Very fatigued.  Otherwise as stated in the HPI and negative for all other systems.  PHYSICAL EXAM BP 150/80  Pulse 64  Ht 5\' 11"  (1.803 m)  Wt 199 lb (90.266 kg)  BMI  27.75 kg/m2 GENERAL: No acute distress HEENT:  Pupils equal round and reactive, fundi not visualized, oral mucosa unremarkable NECK:  No jugular venous distention, waveform within normal limits, carotid upstroke brisk and symmetric, no bruits, no thyromegaly LYMPHATICS:  No cervical, inguinal adenopathy LUNGS:  Clear to auscultation bilaterally BACK:  No CVA tenderness CHEST:  Unremarkable HEART:  PMI not displaced or sustained,S1 and S2 within normal limits, no S3, no S4, no clicks, no rubs, no murmurs ABD:  Flat, positive bowel sounds normal in frequency in pitch, no bruits, no rebound, no guarding, no midline pulsatile mass, no hepatomegaly, no splenomegaly EXT:  2 plus pulses throughout, no edema, no cyanosis no  clubbing SKIN:  No rashes no nodules NEURO:  Cranial nerves II through XII grossly intact, motor grossly intact throughout PSYCH:  Cognitively intact, oriented to person place and time   ASSESSMENT AND PLAN

## 2011-09-19 NOTE — Assessment & Plan Note (Signed)
The blood pressure is at target. No change in medications is indicated. We will continue with therapeutic lifestyle changes (TLC).  

## 2011-09-19 NOTE — Assessment & Plan Note (Signed)
He is maintaining sinus rhythm back on his amiodarone. He has therapeutic INRs.  He will continue the current dose of amiodarone for another 2 weeks and then will reduce to 200 mg daily.

## 2011-09-19 NOTE — Assessment & Plan Note (Signed)
I had a negative stress perfusion study in August of last year. The EF was 64%. No further evaluation is indicated. I

## 2011-09-27 ENCOUNTER — Ambulatory Visit (INDEPENDENT_AMBULATORY_CARE_PROVIDER_SITE_OTHER): Payer: Medicare Other | Admitting: *Deleted

## 2011-09-27 DIAGNOSIS — Z7901 Long term (current) use of anticoagulants: Secondary | ICD-10-CM

## 2011-09-27 DIAGNOSIS — I4891 Unspecified atrial fibrillation: Secondary | ICD-10-CM | POA: Diagnosis not present

## 2011-09-27 LAB — POCT INR: INR: 4.2

## 2011-09-27 NOTE — Patient Instructions (Signed)
Drink lots of fluids to avoid dehydration

## 2011-09-28 ENCOUNTER — Encounter: Payer: Self-pay | Admitting: Internal Medicine

## 2011-09-28 ENCOUNTER — Ambulatory Visit: Payer: Medicare Other | Admitting: Internal Medicine

## 2011-09-28 ENCOUNTER — Ambulatory Visit (INDEPENDENT_AMBULATORY_CARE_PROVIDER_SITE_OTHER): Payer: Medicare Other | Admitting: Internal Medicine

## 2011-09-28 VITALS — BP 122/70 | HR 63 | Temp 98.0°F | Wt 196.0 lb

## 2011-09-28 DIAGNOSIS — R5381 Other malaise: Secondary | ICD-10-CM | POA: Diagnosis not present

## 2011-09-28 DIAGNOSIS — I4891 Unspecified atrial fibrillation: Secondary | ICD-10-CM

## 2011-09-28 DIAGNOSIS — F329 Major depressive disorder, single episode, unspecified: Secondary | ICD-10-CM | POA: Diagnosis not present

## 2011-09-28 DIAGNOSIS — I2510 Atherosclerotic heart disease of native coronary artery without angina pectoris: Secondary | ICD-10-CM | POA: Diagnosis not present

## 2011-09-28 DIAGNOSIS — R5383 Other fatigue: Secondary | ICD-10-CM

## 2011-09-28 NOTE — Patient Instructions (Signed)
It is important that you exercise regularly, at least 20 minutes 3 to 4 times per week.  If you develop chest pain or shortness of breath seek  medical attention.  Limit your sodium (Salt) intake  Return office visit 2 months

## 2011-09-28 NOTE — Progress Notes (Signed)
  Subjective:    Patient ID: [name], male    DOB: 11/20/1926, 78 y.o.   MRN: [id number]  HPI 78 year old patient who is seen today in followup. He was hospitalized recently for atrial fibrillation with rapid ventricular response. This required cardioversion. He has been seen by cardiology in followup post discharge. He is on a restricted salt diet but off all blood pressure medications including diuretics. He continues to have weakness and episodic hypotension. Weakness  has been a fairly chronic complaint.  He has a history of depression dyslipidemia and coronary artery disease. He remains on amiodarone.  He experienced some nausea and vomiting earlier in the week that has resolved  Review of Systems  Constitutional: Negative for fever, chills, appetite change and fatigue.  HENT: Negative for hearing loss, ear pain, congestion, sore throat, trouble swallowing, neck stiffness, dental problem, voice change and tinnitus.   Eyes: Negative for pain, discharge and visual disturbance.  Respiratory: Negative for cough, chest tightness, wheezing and stridor.   Cardiovascular: Negative for chest pain, palpitations and leg swelling.  Gastrointestinal: Positive for nausea and vomiting. Negative for abdominal pain, diarrhea, constipation, blood in stool and abdominal distention.  Genitourinary: Negative for urgency, hematuria, flank pain, discharge, difficulty urinating and genital sores.  Musculoskeletal: Negative for myalgias, back pain, joint swelling, arthralgias and gait problem.  Skin: Negative for rash.  Neurological: Positive for weakness. Negative for dizziness, syncope, speech difficulty, numbness and headaches.  Hematological: Negative for adenopathy. Does not bruise/bleed easily.  Psychiatric/Behavioral: Negative for behavioral problems and dysphoric mood. The patient is not nervous/anxious.        Objective:   Physical Exam  Constitutional: He is oriented to person, place, and time.  He appears well-developed.       Blood pressure 100/60  HENT:  Head: Normocephalic.  Right Ear: External ear normal.  Left Ear: External ear normal.  Eyes: Conjunctivae and EOM are normal.  Neck: Normal range of motion.  Cardiovascular: Normal rate, regular rhythm and normal heart sounds.   Pulmonary/Chest: Breath sounds normal.  Abdominal: Bowel sounds are normal.  Musculoskeletal: Normal range of motion. He exhibits no edema and no tenderness.  Neurological: He is alert and oriented to person, place, and time.  Psychiatric: He has a normal mood and affect. His behavior is normal.          Assessment & Plan:   Chronic fatigue Status post cardioversion for atrial fibrillation Coronary artery disease Hypothyroidism  No change in his medical regimen. He was encouraged to slowly increase his activity level Recheck here in 3 months or as an

## 2011-10-12 ENCOUNTER — Ambulatory Visit (INDEPENDENT_AMBULATORY_CARE_PROVIDER_SITE_OTHER): Payer: Medicare Other | Admitting: *Deleted

## 2011-10-12 DIAGNOSIS — Z7901 Long term (current) use of anticoagulants: Secondary | ICD-10-CM | POA: Diagnosis not present

## 2011-10-12 DIAGNOSIS — I4891 Unspecified atrial fibrillation: Secondary | ICD-10-CM

## 2011-10-12 LAB — POCT INR: INR: 3.9

## 2011-10-17 ENCOUNTER — Other Ambulatory Visit: Payer: Medicare Other

## 2011-10-18 ENCOUNTER — Other Ambulatory Visit: Payer: Medicare Other

## 2011-10-23 ENCOUNTER — Other Ambulatory Visit: Payer: Self-pay | Admitting: Gastroenterology

## 2011-10-23 DIAGNOSIS — R142 Eructation: Secondary | ICD-10-CM | POA: Diagnosis not present

## 2011-10-23 DIAGNOSIS — R141 Gas pain: Secondary | ICD-10-CM | POA: Diagnosis not present

## 2011-10-23 DIAGNOSIS — K219 Gastro-esophageal reflux disease without esophagitis: Secondary | ICD-10-CM | POA: Diagnosis not present

## 2011-10-23 DIAGNOSIS — Z79899 Other long term (current) drug therapy: Secondary | ICD-10-CM | POA: Diagnosis not present

## 2011-10-23 DIAGNOSIS — R143 Flatulence: Secondary | ICD-10-CM | POA: Diagnosis not present

## 2011-10-25 ENCOUNTER — Other Ambulatory Visit (INDEPENDENT_AMBULATORY_CARE_PROVIDER_SITE_OTHER): Payer: Medicare Other

## 2011-10-25 ENCOUNTER — Ambulatory Visit (INDEPENDENT_AMBULATORY_CARE_PROVIDER_SITE_OTHER): Payer: Medicare Other | Admitting: *Deleted

## 2011-10-25 DIAGNOSIS — I10 Essential (primary) hypertension: Secondary | ICD-10-CM | POA: Diagnosis not present

## 2011-10-25 DIAGNOSIS — I4891 Unspecified atrial fibrillation: Secondary | ICD-10-CM

## 2011-10-25 DIAGNOSIS — I2510 Atherosclerotic heart disease of native coronary artery without angina pectoris: Secondary | ICD-10-CM | POA: Diagnosis not present

## 2011-10-25 DIAGNOSIS — Z7901 Long term (current) use of anticoagulants: Secondary | ICD-10-CM

## 2011-10-25 LAB — BASIC METABOLIC PANEL
Calcium: 8.7 mg/dL (ref 8.4–10.5)
Creatinine, Ser: 1.3 mg/dL (ref 0.4–1.5)

## 2011-10-25 LAB — POCT INR: INR: 2.3

## 2011-11-03 ENCOUNTER — Encounter: Payer: Self-pay | Admitting: Cardiology

## 2011-11-03 ENCOUNTER — Ambulatory Visit (INDEPENDENT_AMBULATORY_CARE_PROVIDER_SITE_OTHER): Payer: Medicare Other | Admitting: Cardiology

## 2011-11-03 VITALS — BP 122/82 | HR 62 | Ht 68.0 in | Wt 191.0 lb

## 2011-11-03 DIAGNOSIS — I4891 Unspecified atrial fibrillation: Secondary | ICD-10-CM | POA: Diagnosis not present

## 2011-11-03 DIAGNOSIS — I10 Essential (primary) hypertension: Secondary | ICD-10-CM

## 2011-11-03 DIAGNOSIS — I2510 Atherosclerotic heart disease of native coronary artery without angina pectoris: Secondary | ICD-10-CM

## 2011-11-03 MED ORDER — AMIODARONE HCL 200 MG PO TABS: 200.0000 mg | ORAL_TABLET | Freq: Every day | ORAL | Status: DC

## 2011-11-03 NOTE — Assessment & Plan Note (Signed)
At this point he seems to be maintaining sinus rhythm. He will continue the meds as listed.

## 2011-11-03 NOTE — Assessment & Plan Note (Signed)
We discussed the when necessary use of Norvasc. He feels more comfortable using this and so he will continue.

## 2011-11-03 NOTE — Assessment & Plan Note (Signed)
The patient has no new sypmtoms.  No further cardiovascular testing is indicated.  We will continue with aggressive risk reduction and meds as listed.  

## 2011-11-03 NOTE — Progress Notes (Signed)
HPI Patient presents for management of atrial fibrillation. At the last appointment I reduced his amiodarone. He was also supposed to be taken off of his Norvasc though he has continued to use this when necessary. He actually thinks he is feeling somewhat better. He is walking a mile daily. He's not had any palpitations, presyncope or syncope. He's had no new shortness of breath. He has no chest pressure, neck or arm discomfort.  No Known Allergies  Current Outpatient Prescriptions  Medication Sig Dispense Refill  . amiodarone (PACERONE) 200 MG tablet Take 200 mg by mouth 2 (two) times daily.      [name] amLODipine (NORVASC) 10 MG tablet Take 5 mg by mouth daily.      . clonazePAM (KLONOPIN) 1 MG tablet Take 1 mg by mouth at bedtime.        . Coenzyme Q10 (CO Q 10) 100 MG CAPS Take 1 capsule by mouth daily.        [name] levothyroxine (SYNTHROID, LEVOTHROID) 25 MCG tablet Take 25 mcg by mouth daily.       . Multiple Vitamin (MULTIVITAMIN) capsule Take 1 capsule by mouth daily.      . nitroGLYCERIN (NITROSTAT) 0.4 MG SL tablet Place 0.4 mg under the tongue every 5 (five) minutes as needed.        . pantoprazole (PROTONIX) 40 MG tablet Take 40 mg by mouth 2 (two) times daily.        . pravastatin (PRAVACHOL) 40 MG tablet Take 1 tablet (40 mg total) by mouth every evening.      . Prenatal Vit-Fe Fumarate-FA (M-VIT) tablet Take 1 tablet by mouth daily.        . traZODone (DESYREL) 50 MG tablet Take 150 mg by mouth at bedtime. 1/2 pill at bedtime      . warfarin (COUMADIN) 5 MG tablet Take 1 tablet (5 mg total) by mouth daily.  30 tablet  3    Past Medical History  Diagnosis Date  . HYPOTHYROIDISM 05/20/2007  . HYPERLIPIDEMIA 10/20/2008  . DEPRESSION 12/17/2007  . MITRAL REGURGITATION 10/20/2008  . HYPERTENSION 04/10/2007  . CORONARY ARTERY DISEASE 04/10/2007  . Atrial fibrillation 04/12/2007  . GERD 04/10/2007  . DIVERTICULOSIS, COLON 04/10/2007  . BENIGN PROSTATIC HYPERTROPHY 04/10/2007  . GYNECOMASTIA,  UNILATERAL 03/23/2010  . SLEEP APNEA 05/20/2007  . WEAKNESS 11/09/2009  . CHEST PAIN 05/11/2009  . Hematoma of abdominal wall   . Clotting disorder     anticoag. therapy  . Mallory - Weiss tear     > 5 years ago  . Barrett's esophagus     Past Surgical History  Procedure Date  . Transurethral resection of prostate   . Coronary angioplasty with stent placement   . Tee with cardioversion 7/08  . Cardioversion 8/08  . Esophagogastroduodenoscopy   . Tee without cardioversion 09/14/2011    Procedure: TRANSESOPHAGEAL ECHOCARDIOGRAM (TEE);  Surgeon: Wendall Stade, MD;  Location: [facility] ENDOSCOPY;  Service: Cardiovascular;  Laterality: N/A;  . Cardioversion 09/14/2011    Procedure: CARDIOVERSION;  Surgeon: Wendall Stade, MD;  Location: [facility] ENDOSCOPY;  Service: Cardiovascular;  Laterality: N/A;    ROS:  Very fatigued.  Otherwise as stated in the HPI and negative for all other systems.  PHYSICAL EXAM BP 122/82  Pulse 62  Ht 5\' 8"  (1.727 m)  Wt 191 lb (86.637 kg)  BMI 29.04 kg/m2 GENERAL: No acute distress HEENT:  Pupils equal round and reactive, fundi not visualized, oral mucosa unremarkable NECK:  No jugular venous distention, waveform within normal limits, carotid upstroke brisk and symmetric, no bruits, no thyromegaly LYMPHATICS:  No cervical, inguinal adenopathy LUNGS:  Clear to auscultation bilaterally BACK:  No CVA tenderness CHEST:  Unremarkable HEART:  PMI not displaced or sustained,S1 and S2 within normal limits, no S3, no S4, no clicks, no rubs, no murmurs ABD:  Flat, positive bowel sounds normal in frequency in pitch, no bruits, no rebound, no guarding, no midline pulsatile mass, no hepatomegaly, no splenomegaly EXT:  2 plus pulses throughout, no edema, no cyanosis no clubbing    ASSESSMENT AND PLAN

## 2011-11-03 NOTE — Patient Instructions (Signed)
The current medical regimen is effective;  continue present plan and medications.  Follow up in 6 months with Dr Hochrein.  You will receive a letter in the mail 2 months before you are due.  Please call us when you receive this letter to schedule your follow up appointment.  

## 2011-11-08 ENCOUNTER — Ambulatory Visit
Admission: RE | Admit: 2011-11-08 | Discharge: 2011-11-08 | Disposition: A | Discharge status: Home / Self Care | Payer: Medicare Other | Source: Ambulatory Visit | Attending: Gastroenterology | Admitting: Gastroenterology

## 2011-11-08 DIAGNOSIS — K228 Other specified diseases of esophagus: Secondary | ICD-10-CM | POA: Diagnosis not present

## 2011-11-08 DIAGNOSIS — K219 Gastro-esophageal reflux disease without esophagitis: Secondary | ICD-10-CM | POA: Diagnosis not present

## 2011-11-08 DIAGNOSIS — K2289 Other specified disease of esophagus: Secondary | ICD-10-CM | POA: Diagnosis not present

## 2011-11-08 DIAGNOSIS — K449 Diaphragmatic hernia without obstruction or gangrene: Secondary | ICD-10-CM | POA: Diagnosis not present

## 2011-11-10 ENCOUNTER — Emergency Department (HOSPITAL_COMMUNITY): Payer: Medicare Other

## 2011-11-10 ENCOUNTER — Encounter (HOSPITAL_COMMUNITY): Payer: Self-pay | Admitting: Emergency Medicine

## 2011-11-10 ENCOUNTER — Emergency Department (HOSPITAL_COMMUNITY)
Admission: EM | Admit: 2011-11-10 | Discharge: 2011-11-10 | Disposition: A | Discharge status: Home / Self Care | Payer: Medicare Other | Attending: Emergency Medicine | Admitting: Emergency Medicine

## 2011-11-10 DIAGNOSIS — I2510 Atherosclerotic heart disease of native coronary artery without angina pectoris: Secondary | ICD-10-CM | POA: Diagnosis not present

## 2011-11-10 DIAGNOSIS — E785 Hyperlipidemia, unspecified: Secondary | ICD-10-CM | POA: Diagnosis not present

## 2011-11-10 DIAGNOSIS — E079 Disorder of thyroid, unspecified: Secondary | ICD-10-CM | POA: Diagnosis not present

## 2011-11-10 DIAGNOSIS — W108XXA Fall (on) (from) other stairs and steps, initial encounter: Secondary | ICD-10-CM | POA: Insufficient documentation

## 2011-11-10 DIAGNOSIS — S52599A Other fractures of lower end of unspecified radius, initial encounter for closed fracture: Secondary | ICD-10-CM | POA: Insufficient documentation

## 2011-11-10 DIAGNOSIS — S52509A Unspecified fracture of the lower end of unspecified radius, initial encounter for closed fracture: Secondary | ICD-10-CM

## 2011-11-10 DIAGNOSIS — I10 Essential (primary) hypertension: Secondary | ICD-10-CM | POA: Insufficient documentation

## 2011-11-10 DIAGNOSIS — W19XXXA Unspecified fall, initial encounter: Secondary | ICD-10-CM

## 2011-11-10 DIAGNOSIS — Z79899 Other long term (current) drug therapy: Secondary | ICD-10-CM | POA: Diagnosis not present

## 2011-11-10 DIAGNOSIS — S52609A Unspecified fracture of lower end of unspecified ulna, initial encounter for closed fracture: Secondary | ICD-10-CM | POA: Diagnosis not present

## 2011-11-10 MED ORDER — IBUPROFEN 800 MG PO TABS
800.0000 mg | ORAL_TABLET | Freq: Once | ORAL | Status: AC
  Administered 2011-11-10: 800 mg via ORAL
  Filled 2011-11-10: qty 1

## 2011-11-10 MED ORDER — OXYCODONE-ACETAMINOPHEN 5-325 MG PO TABS: 1.0000 | ORAL_TABLET | ORAL | Status: AC | PRN

## 2011-11-10 MED ORDER — HYDROMORPHONE HCL PF 1 MG/ML IJ SOLN
1.0000 mg | Freq: Once | INTRAMUSCULAR | Status: AC
  Administered 2011-11-10: 1 mg via INTRAMUSCULAR
  Filled 2011-11-10: qty 1

## 2011-11-10 MED ORDER — OXYCODONE-ACETAMINOPHEN 5-325 MG PO TABS: 2.0000 | ORAL_TABLET | Freq: Once | ORAL | Status: DC

## 2011-11-10 MED ORDER — ONDANSETRON 8 MG PO TBDP
8.0000 mg | ORAL_TABLET | Freq: Once | ORAL | Status: AC
  Administered 2011-11-10: 8 mg via ORAL
  Filled 2011-11-10: qty 1

## 2011-11-10 NOTE — ED Provider Notes (Signed)
Medical screening examination/treatment/procedure(s) were conducted as a shared visit with non-physician practitioner(s) and myself.  I personally evaluated the patient during the encounter This pleasant elderly gentleman presents following a fall.  On my exam the patient is in no distress.  He has a notable deformity about his right wrist and an inability to extend the wrist, though he is otherwise neurovascularly intact.  The patient's x-ray showed radius fracture.  I demonstrated the films to the patient and discussed the findings with him.  The patient had a splint placed by the orthopedic technician under my supervision.  The patient was discharged in stable condition to follow up with orthopedics.  Gerhard Munch, MD 11/10/11 2324

## 2011-11-10 NOTE — ED Notes (Signed)
Pt injured R wrist when falling down 1 step, states caught self on R wrist. Pt denies other injury at this time.

## 2011-11-10 NOTE — ED Provider Notes (Signed)
History     CSN: [id number]  Arrival date & time 11/10/11  2058   First MD Initiated Contact with Patient 11/10/11 2115      Chief Complaint  Patient presents with  . Wrist Injury    fall     Patient is a 78 y.o. male presenting with fall. The history is provided by the patient.  Fall The accident occurred 3 to 5 hours ago. The fall occurred while walking. He fell from a height of 3 to 5 ft. He landed on concrete. The point of impact was the right wrist. The pain is present in the right wrist. The pain is at a severity of 10/10. The pain is severe. He was ambulatory at the scene. There was no entrapment after the fall. There was no drug use involved in the accident. There was no alcohol use involved in the accident. Pertinent negatives include no numbness.  Pt reports that at approx 2030 he was walking down very shallow concrete steps outside when his heel caught on last step and he fell. His (R) wrist struck the concrete step and now w/ severe pain and swelling. Denies other injuries. Pt admits to daily Coumadin.  Past Medical History  Diagnosis Date  . HYPOTHYROIDISM 05/20/2007  . HYPERLIPIDEMIA 10/20/2008  . DEPRESSION 12/17/2007  . MITRAL REGURGITATION 10/20/2008  . HYPERTENSION 04/10/2007  . CORONARY ARTERY DISEASE 04/10/2007  . Atrial fibrillation 04/12/2007  . GERD 04/10/2007  . DIVERTICULOSIS, COLON 04/10/2007  . BENIGN PROSTATIC HYPERTROPHY 04/10/2007  . GYNECOMASTIA, UNILATERAL 03/23/2010  . SLEEP APNEA 05/20/2007  . WEAKNESS 11/09/2009  . CHEST PAIN 05/11/2009  . Hematoma of abdominal wall   . Clotting disorder     anticoag. therapy  . Mallory - Weiss tear     > 5 years ago  . Barrett's esophagus     Past Surgical History  Procedure Date  . Transurethral resection of prostate   . Coronary angioplasty with stent placement   . Tee with cardioversion 7/08  . Cardioversion 8/08  . Esophagogastroduodenoscopy   . Tee without cardioversion 09/14/2011    Procedure:  TRANSESOPHAGEAL ECHOCARDIOGRAM (TEE);  Surgeon: Wendall Stade, MD;  Location: [facility] ENDOSCOPY;  Service: Cardiovascular;  Laterality: N/A;  . Cardioversion 09/14/2011    Procedure: CARDIOVERSION;  Surgeon: Wendall Stade, MD;  Location: [facility] ENDOSCOPY;  Service: Cardiovascular;  Laterality: N/A;    Family History  Problem Relation Age of Onset  . Hypertension Other   . Heart disease Brother     CAD male 1st degree relative    History  Substance Use Topics  . Smoking status: Never Smoker   . Smokeless tobacco: Never Used  . Alcohol Use: No      Review of Systems  Constitutional: Negative.   HENT: Negative.  Negative for neck pain.   Eyes: Negative.   Respiratory: Negative.   Cardiovascular: Negative for chest pain.  Gastrointestinal: Negative.   Genitourinary: Negative.   Musculoskeletal: Negative.   Skin: Negative.   Neurological: Negative for dizziness, speech difficulty, light-headedness and numbness.  Hematological: Negative.   Psychiatric/Behavioral: Negative.     Allergies  Review of patient's allergies indicates no known allergies.  Home Medications   Current Outpatient Rx  Name Route Sig Dispense Refill  . AMIODARONE HCL 200 MG PO TABS Oral Take 1 tablet (200 mg total) by mouth daily.    [name] AMLODIPINE BESYLATE 10 MG PO TABS Oral Take 5 mg by mouth daily as needed. Depends on BP    .  CLONAZEPAM 1 MG PO TABS Oral Take 1 mg by mouth at bedtime.      . CO Q 10 100 MG PO CAPS Oral Take 1 capsule by mouth daily.      [name] LEVOTHYROXINE SODIUM 25 MCG PO TABS Oral Take 25 mcg by mouth daily.     . MULTIVITAMINS PO CAPS Oral Take 1 capsule by mouth daily.    [name] NITROGLYCERIN 0.4 MG SL SUBL Sublingual Place 0.4 mg under the tongue every 5 (five) minutes as needed.      [name] PANTOPRAZOLE SODIUM 40 MG PO TBEC Oral Take 40 mg by mouth 2 (two) times daily.      [name] PRAVASTATIN SODIUM 40 MG PO TABS Oral Take 1 tablet (40 mg total) by mouth every evening.    . M-VIT PO TABS Oral Take 1  tablet by mouth daily.      . TRAZODONE HCL 50 MG PO TABS Oral Take 150 mg by mouth at bedtime. 1/2 pill at bedtime    . WARFARIN SODIUM 5 MG PO TABS Oral Take 5 mg by mouth See admin instructions. Takes 5mg  daily except Tues, Thurs, Saturday pt takes half a pill 2.5mg .      BP 156/83  Pulse 60  Temp(Src) 98.4 F (36.9 C) (Oral)  Resp 18  Wt 190 lb (86.183 kg)  SpO2 100%  Physical Exam  Constitutional: He appears well-developed and well-nourished.  HENT:  Head: Atraumatic.  Eyes: Conjunctivae are normal.  Neck: Neck supple. No spinous process tenderness present.  Cardiovascular: Normal rate.   Pulmonary/Chest: Effort normal.  Musculoskeletal:       Arms:      TTP and swelling to ulnar aspect of (R) posterior wrist. Superficial abrasion to ulnar aspect of  anterior (R) wrist. Good pulses, sensation and color distal to wound.    ED Course  Procedures   Pt reports some relief of pain w/ medication. Findings and clinical impression discussed w/ pt and wife. Sugar-tong short arm splint has been applied. CMS intact distally to splint. D/c plan discussed. Pt agreeable w/ plan. Dr Jeraldine Loots has seen pt and is agreeable w/ plan as well.   Labs Reviewed - No data to display Dg Forearm Right  11/10/2011  *RADIOLOGY REPORT*  Clinical Data: Fall  RIGHT FOREARM - 2 VIEW  Comparison: None.  Findings: Osteopenia.  A transverse fracture across the distal radius.  Dorsal angulation of the distal radius articular surface. Proximal radius and ulna are intact.  IMPRESSION: Distal radius fracture.  Original Report Authenticated By: Donavan Burnet, M.D.   Dg Wrist Complete Right  11/10/2011  *RADIOLOGY REPORT*  Clinical Data: Fall  RIGHT WRIST - COMPLETE 3+ VIEW  Comparison: None.  Findings: Osteopenia.  Minimally displaced ulnar styloid fracture. Transverse fracture across the distal radius metaphysis.  Fracture. The radiocarpal joint.  There is some comminution.  Dorsal angulation of the distal radius  articular surface.  IMPRESSION: Distal radius and ulnar styloid fractures as described. Per CMS PQRS reporting requirements (PQRS Measure 24): Given the patient's age of greater than 50 and the fracture site (hip, distal radius, or spine), the patient should be tested for osteoporosis using DXA, and the appropriate treatment considered based on the DXA results.  Original Report Authenticated By: Donavan Burnet, M.D.     No diagnosis found.    MDM  HPI/PE and clinical findings c/w 1. Distal radius fx        Leanne Chang, NP 11/10/11 2306

## 2011-11-10 NOTE — ED Notes (Signed)
transported to xray 

## 2011-11-10 NOTE — Discharge Instructions (Signed)
Please read over the instructions below. Xrays tonight show you have a broken wrist (radius). Take medication as directed for pain. Refer to the "RICE" instructions below. Call Dr Alveda Reasons on Monday to arrange your follow up appointment for re-evaluation of this injury. Return for concerns before that time if needed.  Cast or Splint Care Casts and splints support injured limbs and keep bones from moving while they heal.  HOME CARE  Keep the cast or splint uncovered during the drying period.   A plaster cast can take 24 to 48 hours to dry.   A fiberglass cast will dry in less than 1 hour.   Do not rest the cast on anything harder than a pillow for 24 hours.   Do not put weight on your injured limb. Do not put pressure on the cast. Wait for your doctor's approval.   Keep the cast or splint dry.   Cover the cast or splint with a plastic bag during baths or wet weather.   If you have a cast over your chest and belly (trunk), take sponge baths until the cast is taken off.   Keep your cast or splint clean. Wash a dirty cast with a damp cloth.   Do not put any objects under your cast or splint. Do not scratch the skin under the cast with an object.   Do not take out the padding from inside your cast.   Exercise your joints near the cast as told by your doctor.   Raise (elevate) your injured limb on 1 or 2 pillows for the first 1 to 3 days.  GET HELP RIGHT AWAY IF:  Your cast or splint cracks.   Your cast or splint is too tight or too loose.   You itch badly under the cast.   Your cast gets wet or has a soft spot.   You have a bad smell coming from the cast.   You get an object stuck under the cast.   Your skin around the cast becomes red or raw.   You have new or more pain after the cast is put on.   You have fluid leaking through the cast.   You cannot move your fingers or toes.   Your fingers or toes turn colors or are cool, painful, or puffy (swollen).   You have  tingling or lose feeling (numbness) around the injured area.   You have pain or pressure under the cast.   You have trouble breathing or have shortness of breath.   You have chest pain.  MAKE SURE YOU:  Understand these instructions.   Will watch your condition.   Will get help right away if you are not doing well or get worse.  Document Released: 11/02/2010 Document Revised: 06/22/2011 Document Reviewed: 11/02/2010 [facility] Patient Information 2012 [place], [place].Radial Fracture You have a broken bone (fracture) of the forearm. This is the part of your arm between the elbow and your wrist. Your forearm is made up of two bones. These are the radius and ulna. Your fracture is in the radial shaft. This is the bone in your forearm located on the thumb side. A cast or splint is used to protect and keep your injured bone from moving. The cast or splint will be on generally for about 5 to 6 weeks, with individual variations. HOME CARE INSTRUCTIONS   Keep the injured part elevated while sitting or lying down. Keep the injury above the level of your heart (the center of  the chest). This will decrease swelling and pain.   Apply ice to the injury for 15 to 20 minutes, 3 to 4 times per day while awake, for 2 days. Put the ice in a plastic bag and place a towel between the bag of ice and your cast or splint.   Move your fingers to avoid stiffness and minimize swelling.   If you have a plaster or fiberglass cast:   Do not try to scratch the skin under the cast using sharp or pointed objects.   Check the skin around the cast every day. You may put lotion on any red or sore areas.   Keep your cast dry and clean.   If you have a plaster splint:   Wear the splint as directed.   You may loosen the elastic around the splint if your fingers become numb, tingle, or turn cold or blue.   Do not put pressure on any part of your cast or splint. It may break. Rest your cast only on a pillow for the  first 24 hours until it is fully hardened.   Your cast or splint can be protected during bathing with a plastic bag. Do not lower the cast or splint into water.   Only take over-the-counter or prescription medicines for pain, discomfort, or fever as directed by your caregiver.  SEEK IMMEDIATE MEDICAL CARE IF:   Your cast gets damaged or breaks.   You have more severe pain or swelling than you did before getting the cast.   You have severe pain when stretching your fingers.   There is a bad smell, new stains and/or pus-like (purulent) drainage coming from under the cast.   Your fingers or hand turn pale or blue and become cold or your loose feeling.  Document Released: 12/14/2005 Document Revised: 06/22/2011 Document Reviewed: 03/12/2006 [facility] Patient Information 2012 [place], [place].

## 2011-11-13 DIAGNOSIS — S52599A Other fractures of lower end of unspecified radius, initial encounter for closed fracture: Secondary | ICD-10-CM | POA: Diagnosis not present

## 2011-11-15 ENCOUNTER — Ambulatory Visit (INDEPENDENT_AMBULATORY_CARE_PROVIDER_SITE_OTHER): Payer: Medicare Other | Admitting: *Deleted

## 2011-11-15 DIAGNOSIS — I4891 Unspecified atrial fibrillation: Secondary | ICD-10-CM

## 2011-11-15 DIAGNOSIS — Z7901 Long term (current) use of anticoagulants: Secondary | ICD-10-CM | POA: Diagnosis not present

## 2011-11-22 ENCOUNTER — Ambulatory Visit (INDEPENDENT_AMBULATORY_CARE_PROVIDER_SITE_OTHER): Payer: Medicare Other | Admitting: Internal Medicine

## 2011-11-22 ENCOUNTER — Encounter: Payer: Self-pay | Admitting: Internal Medicine

## 2011-11-22 VITALS — BP 110/78 | Temp 97.5°F | Wt 197.0 lb

## 2011-11-22 DIAGNOSIS — I2510 Atherosclerotic heart disease of native coronary artery without angina pectoris: Secondary | ICD-10-CM

## 2011-11-22 DIAGNOSIS — E785 Hyperlipidemia, unspecified: Secondary | ICD-10-CM

## 2011-11-22 DIAGNOSIS — I10 Essential (primary) hypertension: Secondary | ICD-10-CM | POA: Diagnosis not present

## 2011-11-22 DIAGNOSIS — I4891 Unspecified atrial fibrillation: Secondary | ICD-10-CM | POA: Diagnosis not present

## 2011-11-22 NOTE — Progress Notes (Signed)
  Subjective:    Patient ID: [name], male    DOB: [birth year], 78 y.o.   MRN: [id number]  HPI  78 year old patient who is seen today for followup. He has a history of paroxysmal H. her for relation and more recently has done quite well he has a history of weakness and neuresthenia  which are stable.  He fell approximately 3 weeks ago sustaining a right radial fracture. Otherwise he has done remarkably well. Denies any cardiopulmonary complaints. He has treated hypertension and dyslipidemia. He remains on chronic Coumadin anticoagulation    Review of Systems  Constitutional: Negative for fever, chills, appetite change and fatigue.  HENT: Negative for hearing loss, ear pain, congestion, sore throat, trouble swallowing, neck stiffness, dental problem, voice change and tinnitus.   Eyes: Negative for pain, discharge and visual disturbance.  Respiratory: Negative for cough, chest tightness, wheezing and stridor.   Cardiovascular: Negative for chest pain, palpitations and leg swelling.  Gastrointestinal: Negative for nausea, vomiting, abdominal pain, diarrhea, constipation, blood in stool and abdominal distention.  Genitourinary: Negative for urgency, hematuria, flank pain, discharge, difficulty urinating and genital sores.  Musculoskeletal: Negative for myalgias, back pain, joint swelling, arthralgias and gait problem.  Skin: Negative for rash.  Neurological: Positive for weakness. Negative for dizziness, syncope, speech difficulty, numbness and headaches.  Hematological: Negative for adenopathy. Does not bruise/bleed easily.  Psychiatric/Behavioral: Negative for behavioral problems and dysphoric mood. The patient is not nervous/anxious.        Objective:   Physical Exam  Constitutional: He is oriented to person, place, and time. He appears well-developed.  HENT:  Head: Normocephalic.  Right Ear: External ear normal.  Left Ear: External ear normal.  Eyes: Conjunctivae and EOM are normal.   Neck: Normal range of motion.  Cardiovascular: Normal rate, regular rhythm and normal heart sounds.   Pulmonary/Chest: Breath sounds normal.  Abdominal: Bowel sounds are normal.  Musculoskeletal: Normal range of motion. He exhibits no edema and no tenderness.  Neurological: He is alert and oriented to person, place, and time.  Psychiatric: He has a normal mood and affect. His behavior is normal.          Assessment & Plan:   Paroxysmal atrial fibrillation stable Hypertension well controlled Weakness improved  Follow cardiology Recheck here in 6 months

## 2011-11-22 NOTE — Patient Instructions (Signed)
Limit your sodium (Salt) intake    It is important that you exercise regularly, at least 20 minutes 3 to 4 times per week.  If you develop chest pain or shortness of breath seek  medical attention.  Return in 6 months for follow-up  

## 2011-11-29 ENCOUNTER — Ambulatory Visit: Payer: Medicare Other | Admitting: Internal Medicine

## 2011-12-06 DIAGNOSIS — S52599A Other fractures of lower end of unspecified radius, initial encounter for closed fracture: Secondary | ICD-10-CM | POA: Diagnosis not present

## 2011-12-13 ENCOUNTER — Ambulatory Visit (INDEPENDENT_AMBULATORY_CARE_PROVIDER_SITE_OTHER): Payer: Medicare Other | Admitting: *Deleted

## 2011-12-13 DIAGNOSIS — Z7901 Long term (current) use of anticoagulants: Secondary | ICD-10-CM

## 2011-12-13 DIAGNOSIS — I4891 Unspecified atrial fibrillation: Secondary | ICD-10-CM

## 2011-12-13 MED ORDER — WARFARIN SODIUM 5 MG PO TABS: ORAL_TABLET | ORAL | Status: DC

## 2011-12-27 DIAGNOSIS — S52599A Other fractures of lower end of unspecified radius, initial encounter for closed fracture: Secondary | ICD-10-CM | POA: Diagnosis not present

## 2012-01-12 ENCOUNTER — Telehealth: Payer: Self-pay | Admitting: Cardiology

## 2012-01-12 NOTE — Telephone Encounter (Signed)
New Problem:    Patient called in because his heart rate is sunning around 112-130 and he feels "weak as a dish rag" and would like to be seen today, patient stated this over three times.  Patient believes that his heart may be out of rhythm.  Please call back.

## 2012-01-12 NOTE — Telephone Encounter (Signed)
Pt states he is feeling some better now - this AM his BP was 85/55 HR was 112-130.  His HR is normally 50-60.  He reports feeling very weak and shaky.  This afternoon he reports feeling somewhat better BP now is 119/62 and HR is between 60 and 76.  He will call [facility] this weekend if problems reoccur.

## 2012-01-24 ENCOUNTER — Ambulatory Visit (INDEPENDENT_AMBULATORY_CARE_PROVIDER_SITE_OTHER): Payer: Medicare Other | Admitting: Pharmacist

## 2012-01-24 DIAGNOSIS — Z7901 Long term (current) use of anticoagulants: Secondary | ICD-10-CM | POA: Diagnosis not present

## 2012-01-24 DIAGNOSIS — I4891 Unspecified atrial fibrillation: Secondary | ICD-10-CM

## 2012-01-24 LAB — POCT INR: INR: 2.4

## 2012-03-06 ENCOUNTER — Ambulatory Visit (INDEPENDENT_AMBULATORY_CARE_PROVIDER_SITE_OTHER): Payer: Medicare Other | Admitting: *Deleted

## 2012-03-06 DIAGNOSIS — Z7901 Long term (current) use of anticoagulants: Secondary | ICD-10-CM

## 2012-03-06 DIAGNOSIS — I4891 Unspecified atrial fibrillation: Secondary | ICD-10-CM | POA: Diagnosis not present

## 2012-03-06 LAB — POCT INR: INR: 2.9

## 2012-04-03 ENCOUNTER — Encounter: Payer: Self-pay | Admitting: Cardiology

## 2012-04-03 ENCOUNTER — Ambulatory Visit (INDEPENDENT_AMBULATORY_CARE_PROVIDER_SITE_OTHER): Payer: Medicare Other | Admitting: Cardiology

## 2012-04-03 VITALS — BP 169/84 | HR 59 | Ht 72.0 in | Wt 196.2 lb

## 2012-04-03 DIAGNOSIS — I2510 Atherosclerotic heart disease of native coronary artery without angina pectoris: Secondary | ICD-10-CM | POA: Diagnosis not present

## 2012-04-03 DIAGNOSIS — I10 Essential (primary) hypertension: Secondary | ICD-10-CM | POA: Diagnosis not present

## 2012-04-03 DIAGNOSIS — I080 Rheumatic disorders of both mitral and aortic valves: Secondary | ICD-10-CM | POA: Diagnosis not present

## 2012-04-03 DIAGNOSIS — Z79899 Other long term (current) drug therapy: Secondary | ICD-10-CM

## 2012-04-03 DIAGNOSIS — E785 Hyperlipidemia, unspecified: Secondary | ICD-10-CM

## 2012-04-03 DIAGNOSIS — I4891 Unspecified atrial fibrillation: Secondary | ICD-10-CM | POA: Diagnosis not present

## 2012-04-03 LAB — HEPATIC FUNCTION PANEL
Bilirubin, Direct: 0.1 mg/dL (ref 0.0–0.3)
Total Bilirubin: 0.7 mg/dL (ref 0.3–1.2)

## 2012-04-03 LAB — TSH: TSH: 3.49 u[IU]/mL (ref 0.35–5.50)

## 2012-04-03 NOTE — Progress Notes (Signed)
HPI The patient presents for his usual follow up.  Since I last saw him he has had no new problems.  He continues to have fatigue with some days being severe.  This has been chronic.  It does not seem to correlate with blood pressure. He blood pressure does fluctuate. He it appears to be controlled.  The patient denies any new symptoms such as chest discomfort, neck or arm discomfort. There has been no new shortness of breath, PND or orthopnea. There have been no reported palpitations, presyncope or syncope.  No Known Allergies  Current Outpatient Prescriptions  Medication Sig Dispense Refill  . amiodarone (PACERONE) 200 MG tablet Take 1 tablet (200 mg total) by mouth daily.      [name] amLODipine (NORVASC) 10 MG tablet Take 5 mg by mouth daily as needed. Depends on BP      . Cholecalciferol (VITAMIN D-3 PO) Take 7,000 Units by mouth daily.      . clonazePAM (KLONOPIN) 1 MG tablet Take 1 mg by mouth at bedtime.        . Coenzyme Q10 (CO Q 10) 100 MG CAPS Take 1 capsule by mouth daily.        [name] DIGESTIVE ENZYMES PO Take by mouth daily.      . fish oil-omega-3 fatty acids 1000 MG capsule Take 3 g by mouth daily.      [name] levothyroxine (SYNTHROID, LEVOTHROID) 25 MCG tablet Take 25 mcg by mouth daily.       . nitroGLYCERIN (NITROSTAT) 0.4 MG SL tablet Place 0.4 mg under the tongue every 5 (five) minutes as needed.        . pantoprazole (PROTONIX) 40 MG tablet Take 40 mg by mouth 2 (two) times daily.        . pravastatin (PRAVACHOL) 40 MG tablet Take 1 tablet (40 mg total) by mouth every evening.      . Prenatal Vit-Fe Fumarate-FA (M-VIT) tablet Take 1 tablet by mouth daily.        . traZODone (DESYREL) 50 MG tablet Take 150 mg by mouth at bedtime. 1/2 pill at bedtime      . warfarin (COUMADIN) 5 MG tablet Take per coumadin clinic instructions, dose changes according to INR, this is a 90 supply  105 tablet  1  . Multiple Vitamin (MULTIVITAMIN) capsule Take 1 capsule by mouth daily.        Past  Medical History  Diagnosis Date  . HYPOTHYROIDISM 05/20/2007  . HYPERLIPIDEMIA 10/20/2008  . DEPRESSION 12/17/2007  . MITRAL REGURGITATION 10/20/2008  . HYPERTENSION 04/10/2007  . CORONARY ARTERY DISEASE 04/10/2007  . Atrial fibrillation 04/12/2007  . GERD 04/10/2007  . DIVERTICULOSIS, COLON 04/10/2007  . BENIGN PROSTATIC HYPERTROPHY 04/10/2007  . GYNECOMASTIA, UNILATERAL 03/23/2010  . SLEEP APNEA 05/20/2007  . WEAKNESS 11/09/2009  . CHEST PAIN 05/11/2009  . Hematoma of abdominal wall   . Clotting disorder     anticoag. therapy  . Mallory - Weiss tear     > 5 years ago  . Barrett's esophagus     Past Surgical History  Procedure Date  . Transurethral resection of prostate   . Coronary angioplasty with stent placement   . Tee with cardioversion 7/08  . Cardioversion 8/08  . Esophagogastroduodenoscopy   . Tee without cardioversion 09/14/2011    Procedure: TRANSESOPHAGEAL ECHOCARDIOGRAM (TEE);  Surgeon: Wendall Stade, MD;  Location: [facility] ENDOSCOPY;  Service: Cardiovascular;  Laterality: N/A;  . Cardioversion 09/14/2011    Procedure: CARDIOVERSION;  Surgeon: Wendall Stade, MD;  Location: [facility] ENDOSCOPY;  Service: Cardiovascular;  Laterality: N/A;    ROS:  Very fatigued.  Otherwise as stated in the HPI and negative for all other systems.  PHYSICAL EXAM BP 169/84  Pulse 59  Ht 6' (1.829 m)  Wt 196 lb 3.2 oz (88.996 kg)  BMI 26.61 kg/m2 GENERAL: No acute distress HEENT:  Pupils equal round and reactive, fundi not visualized, oral mucosa unremarkable NECK:  No jugular venous distention, waveform within normal limits, carotid upstroke brisk and symmetric, no bruits, no thyromegaly LYMPHATICS:  No cervical, inguinal adenopathy LUNGS:  Clear to auscultation bilaterally BACK:  No CVA tenderness CHEST:  Unremarkable HEART:  PMI not displaced or sustained,S1 and S2 within normal limits, no S3, no S4, no clicks, no rubs, apical and axillary late systolic murmur ABD:  Flat, positive bowel sounds  normal in frequency in pitch, no bruits, no rebound, no guarding, no midline pulsatile mass, no hepatomegaly, no splenomegaly EXT:  2 plus pulses throughout, no edema, no cyanosis no clubbing PSYCH:  Tearful with depressed affect  EKG:  Sinus bradycardia, rate 59, axis within normal limits, intervals within normal limits, no acute ST-T wave changes.   ASSESSMENT AND PLAN   CORONARY ARTERY DISEASE -  The patient has no new sypmtoms.  No further cardiovascular testing is indicated.  We will continue with aggressive risk reduction and meds as listed.  HYPERTENSION -  His blood pressure is slightly labile. However, for the most part is well controlled and he will continue the meds as listed.  MITRAL REGURGITATION -  He has had only mild mitral regurgitation and a well preserved ejection fraction. No change in therapy is indicated.  ATRIAL FIBRILLATION - He has had no recurrent dysrhythmias. I will continue with amiodarone and anticoagulation. Objective TSH and liver profile today.  FATIGUE - This continues to be a significant problem. I have directed him again to Rogelia Boga, MD to discu andss depression.  He has not wanted to take meds for this in the past.

## 2012-04-03 NOTE — Patient Instructions (Addendum)
The current medical regimen is effective;  continue present plan and medications.  Please have blood work today.  Follow up in 6 months with Dr Hochrein.  You will receive a letter in the mail 2 months before you are due.  Please call us when you receive this letter to schedule your follow up appointment.  

## 2012-04-09 ENCOUNTER — Ambulatory Visit: Payer: Medicare Other | Admitting: Cardiology

## 2012-04-15 ENCOUNTER — Ambulatory Visit: Payer: Medicare Other | Admitting: Cardiology

## 2012-04-17 ENCOUNTER — Ambulatory Visit (INDEPENDENT_AMBULATORY_CARE_PROVIDER_SITE_OTHER): Payer: Medicare Other | Admitting: Pharmacist

## 2012-04-17 DIAGNOSIS — I4891 Unspecified atrial fibrillation: Secondary | ICD-10-CM

## 2012-04-17 DIAGNOSIS — Z7901 Long term (current) use of anticoagulants: Secondary | ICD-10-CM

## 2012-04-29 ENCOUNTER — Ambulatory Visit (INDEPENDENT_AMBULATORY_CARE_PROVIDER_SITE_OTHER): Payer: Medicare Other | Admitting: *Deleted

## 2012-04-29 DIAGNOSIS — Z7901 Long term (current) use of anticoagulants: Secondary | ICD-10-CM

## 2012-04-29 DIAGNOSIS — I4891 Unspecified atrial fibrillation: Secondary | ICD-10-CM | POA: Diagnosis not present

## 2012-04-29 LAB — POCT INR: INR: 2.2

## 2012-05-01 ENCOUNTER — Ambulatory Visit: Payer: Medicare Other | Admitting: Cardiology

## 2012-05-13 ENCOUNTER — Ambulatory Visit (INDEPENDENT_AMBULATORY_CARE_PROVIDER_SITE_OTHER): Payer: Medicare Other | Admitting: *Deleted

## 2012-05-13 DIAGNOSIS — Z7901 Long term (current) use of anticoagulants: Secondary | ICD-10-CM

## 2012-05-13 DIAGNOSIS — I4891 Unspecified atrial fibrillation: Secondary | ICD-10-CM | POA: Diagnosis not present

## 2012-05-13 LAB — POCT INR: INR: 2.7

## 2012-05-13 MED ORDER — WARFARIN SODIUM 5 MG PO TABS: ORAL_TABLET | ORAL | Status: DC

## 2012-05-22 ENCOUNTER — Ambulatory Visit (INDEPENDENT_AMBULATORY_CARE_PROVIDER_SITE_OTHER): Payer: Medicare Other | Admitting: Internal Medicine

## 2012-05-22 ENCOUNTER — Encounter: Payer: Self-pay | Admitting: Internal Medicine

## 2012-05-22 VITALS — BP 140/90 | Temp 97.6°F | Wt 200.0 lb

## 2012-05-22 DIAGNOSIS — Z23 Encounter for immunization: Secondary | ICD-10-CM

## 2012-05-22 DIAGNOSIS — I4891 Unspecified atrial fibrillation: Secondary | ICD-10-CM

## 2012-05-22 DIAGNOSIS — I10 Essential (primary) hypertension: Secondary | ICD-10-CM

## 2012-05-22 DIAGNOSIS — I2510 Atherosclerotic heart disease of native coronary artery without angina pectoris: Secondary | ICD-10-CM

## 2012-05-22 NOTE — Progress Notes (Signed)
Subjective:    Patient ID: [name], male    DOB: 12/31/1926, 78 y.o.   MRN: [id number]  HPI  78 year old patient who is seen today for followup. He has been seen recently cardiology. He has a history of coronary artery disease atrial fibrillation as well as treated hypertension. Blood pressure medication has been down titrator the patient feels much improved. He tracks blood pressure readings closely and uses amlodipine when necessary only. Denies any cardiac symptoms. He is scheduled to see cardiology again in 6 months. Remains on Coumadin anticoagulation  Past Medical History  Diagnosis Date  . HYPOTHYROIDISM 05/20/2007  . HYPERLIPIDEMIA 10/20/2008  . DEPRESSION 12/17/2007  . MITRAL REGURGITATION 10/20/2008  . HYPERTENSION 04/10/2007  . CORONARY ARTERY DISEASE 04/10/2007  . Atrial fibrillation 04/12/2007  . GERD 04/10/2007  . DIVERTICULOSIS, COLON 04/10/2007  . BENIGN PROSTATIC HYPERTROPHY 04/10/2007  . GYNECOMASTIA, UNILATERAL 03/23/2010  . SLEEP APNEA 05/20/2007  . WEAKNESS 11/09/2009  . CHEST PAIN 05/11/2009  . Hematoma of abdominal wall   . Clotting disorder     anticoag. therapy  . Mallory - Weiss tear     > 5 years ago  . Barrett's esophagus     History   Social History  . Marital Status: Married    Spouse Name: N/A    Number of Children: N/A  . Years of Education: N/A   Occupational History  . Not on file.   Social History Main Topics  . Smoking status: Never Smoker   . Smokeless tobacco: Never Used  . Alcohol Use: No  . Drug Use: Not on file  . Sexually Active: Not on file   Other Topics Concern  . Not on file   Social History Narrative   He is retired from [employer] certified [employer].Mother lived into her 45's.  Father died in his 47's.  No CAD.    Past Surgical History  Procedure Date  . Transurethral resection of prostate   . Coronary angioplasty with stent placement   . Tee with cardioversion 7/08  . Cardioversion 8/08  . Esophagogastroduodenoscopy   . Tee without  cardioversion 09/14/2011    Procedure: TRANSESOPHAGEAL ECHOCARDIOGRAM (TEE);  Surgeon: Wendall Stade, MD;  Location: [facility] ENDOSCOPY;  Service: Cardiovascular;  Laterality: N/A;  . Cardioversion 09/14/2011    Procedure: CARDIOVERSION;  Surgeon: Wendall Stade, MD;  Location: [facility] ENDOSCOPY;  Service: Cardiovascular;  Laterality: N/A;    Family History  Problem Relation Age of Onset  . Hypertension Other   . Heart disease Brother     CAD male 1st degree relative    No Known Allergies  Current Outpatient Prescriptions on File Prior to Visit  Medication Sig Dispense Refill  . amiodarone (PACERONE) 200 MG tablet Take 1 tablet (200 mg total) by mouth daily.      [name] amLODipine (NORVASC) 10 MG tablet Take 5 mg by mouth daily as needed. Depends on BP      . Cholecalciferol (VITAMIN D-3 PO) Take 7,000 Units by mouth daily.      . clonazePAM (KLONOPIN) 1 MG tablet Take 1 mg by mouth at bedtime.        . Coenzyme Q10 (CO Q 10) 100 MG CAPS Take 1 capsule by mouth daily.        [name] DIGESTIVE ENZYMES PO Take by mouth daily.      . fish oil-omega-3 fatty acids 1000 MG capsule Take 3 g by mouth daily.      [name] levothyroxine (SYNTHROID, LEVOTHROID) 25  MCG tablet Take 25 mcg by mouth daily.       . Multiple Vitamin (MULTIVITAMIN) capsule Take 1 capsule by mouth daily.      . nitroGLYCERIN (NITROSTAT) 0.4 MG SL tablet Place 0.4 mg under the tongue every 5 (five) minutes as needed.        . pantoprazole (PROTONIX) 40 MG tablet Take 40 mg by mouth 2 (two) times daily.        . Prenatal Vit-Fe Fumarate-FA (M-VIT) tablet Take 1 tablet by mouth daily.        . traZODone (DESYREL) 50 MG tablet Take 150 mg by mouth at bedtime. 1/2 pill at bedtime      . warfarin (COUMADIN) 5 MG tablet Take per coumadin clinic instructions, dose changes according to INR, this is a 90 supply  105 tablet  1  . [DISCONTINUED] pravastatin (PRAVACHOL) 40 MG tablet Take 1 tablet (40 mg total) by mouth every evening.        BP 140/90  Temp  97.6 F (36.4 C) (Oral)  Wt 200 lb (90.719 kg)       Review of Systems  Constitutional: Negative for fever, chills, appetite change and fatigue.  HENT: Negative for hearing loss, ear pain, congestion, sore throat, trouble swallowing, neck stiffness, dental problem, voice change and tinnitus.   Eyes: Negative for pain, discharge and visual disturbance.  Respiratory: Negative for cough, chest tightness, wheezing and stridor.   Cardiovascular: Negative for chest pain, palpitations and leg swelling.  Gastrointestinal: Negative for nausea, vomiting, abdominal pain, diarrhea, constipation, blood in stool and abdominal distention.  Genitourinary: Negative for urgency, hematuria, flank pain, discharge, difficulty urinating and genital sores.  Musculoskeletal: Negative for myalgias, back pain, joint swelling, arthralgias and gait problem.  Skin: Negative for rash.  Neurological: Negative for dizziness, syncope, speech difficulty, weakness, numbness and headaches.  Hematological: Negative for adenopathy. Does not bruise/bleed easily.  Psychiatric/Behavioral: Negative for behavioral problems and dysphoric mood. The patient is not nervous/anxious.        Objective:   Physical Exam  Constitutional: He is oriented to person, place, and time. He appears well-developed.  HENT:  Head: Normocephalic.  Right Ear: External ear normal.  Left Ear: External ear normal.  Eyes: Conjunctivae normal and EOM are normal.  Neck: Normal range of motion.  Cardiovascular: Normal rate and normal heart sounds.   Pulmonary/Chest: Breath sounds normal.  Abdominal: Bowel sounds are normal.  Musculoskeletal: Normal range of motion. He exhibits no edema and no tenderness.  Neurological: He is alert and oriented to person, place, and time.  Psychiatric: He has a normal mood and affect. His behavior is normal.          Assessment & Plan:   Hypertension. Blood pressure 140/85 Coronary artery disease  stable Atrial fibrillation stable  chronic Coumadin anticoagulation

## 2012-05-22 NOTE — Patient Instructions (Signed)
Limit your sodium (Salt) intake    It is important that you exercise regularly, at least 20 minutes 3 to 4 times per week.  If you develop chest pain or shortness of breath seek  medical attention.  Please check your blood pressure on a regular basis.  If it is consistently greater than 150/90, please make an office appointment.  Return in 6 months for follow-up   

## 2012-06-10 ENCOUNTER — Ambulatory Visit (INDEPENDENT_AMBULATORY_CARE_PROVIDER_SITE_OTHER): Payer: Medicare Other | Admitting: Pharmacist

## 2012-06-10 DIAGNOSIS — Z7901 Long term (current) use of anticoagulants: Secondary | ICD-10-CM

## 2012-06-10 DIAGNOSIS — I4891 Unspecified atrial fibrillation: Secondary | ICD-10-CM

## 2012-06-10 LAB — POCT INR: INR: 2.2

## 2012-07-22 ENCOUNTER — Ambulatory Visit (INDEPENDENT_AMBULATORY_CARE_PROVIDER_SITE_OTHER): Payer: Medicare Other | Admitting: *Deleted

## 2012-07-22 DIAGNOSIS — Z7901 Long term (current) use of anticoagulants: Secondary | ICD-10-CM | POA: Diagnosis not present

## 2012-07-22 DIAGNOSIS — I4891 Unspecified atrial fibrillation: Secondary | ICD-10-CM | POA: Diagnosis not present

## 2012-08-15 DIAGNOSIS — L723 Sebaceous cyst: Secondary | ICD-10-CM | POA: Diagnosis not present

## 2012-08-15 DIAGNOSIS — L259 Unspecified contact dermatitis, unspecified cause: Secondary | ICD-10-CM | POA: Diagnosis not present

## 2012-08-22 DIAGNOSIS — N401 Enlarged prostate with lower urinary tract symptoms: Secondary | ICD-10-CM | POA: Diagnosis not present

## 2012-09-02 ENCOUNTER — Ambulatory Visit (INDEPENDENT_AMBULATORY_CARE_PROVIDER_SITE_OTHER): Payer: Medicare Other | Admitting: *Deleted

## 2012-09-02 DIAGNOSIS — Z7901 Long term (current) use of anticoagulants: Secondary | ICD-10-CM | POA: Diagnosis not present

## 2012-09-02 DIAGNOSIS — I4891 Unspecified atrial fibrillation: Secondary | ICD-10-CM

## 2012-09-09 DIAGNOSIS — L259 Unspecified contact dermatitis, unspecified cause: Secondary | ICD-10-CM | POA: Diagnosis not present

## 2012-10-09 ENCOUNTER — Ambulatory Visit (INDEPENDENT_AMBULATORY_CARE_PROVIDER_SITE_OTHER): Payer: Medicare Other | Admitting: Cardiology

## 2012-10-09 ENCOUNTER — Encounter: Payer: Self-pay | Admitting: Cardiology

## 2012-10-09 ENCOUNTER — Ambulatory Visit (INDEPENDENT_AMBULATORY_CARE_PROVIDER_SITE_OTHER): Payer: Medicare Other | Admitting: Pharmacist

## 2012-10-09 VITALS — BP 150/82 | HR 64 | Ht 72.0 in | Wt 201.2 lb

## 2012-10-09 DIAGNOSIS — I10 Essential (primary) hypertension: Secondary | ICD-10-CM | POA: Diagnosis not present

## 2012-10-09 DIAGNOSIS — I4891 Unspecified atrial fibrillation: Secondary | ICD-10-CM | POA: Diagnosis not present

## 2012-10-09 DIAGNOSIS — I080 Rheumatic disorders of both mitral and aortic valves: Secondary | ICD-10-CM

## 2012-10-09 DIAGNOSIS — Z7901 Long term (current) use of anticoagulants: Secondary | ICD-10-CM

## 2012-10-09 DIAGNOSIS — I2510 Atherosclerotic heart disease of native coronary artery without angina pectoris: Secondary | ICD-10-CM | POA: Diagnosis not present

## 2012-10-09 DIAGNOSIS — G4730 Sleep apnea, unspecified: Secondary | ICD-10-CM

## 2012-10-09 LAB — POCT INR: INR: 1.2

## 2012-10-09 MED ORDER — WARFARIN SODIUM 5 MG PO TABS: ORAL_TABLET | ORAL | Status: DC

## 2012-10-09 NOTE — Progress Notes (Signed)
HPI The patient presents for his usual follow up.  Since I last saw him he has had no new problems.  He continues to have fatigue as his most pronounced complaint.   This has been chronic. His blood pressure does fluctuate.   The patient denies any new symptoms such as chest discomfort, neck or arm discomfort. There has been no new shortness of breath, PND or orthopnea. There have been no reported palpitations, presyncope or syncope.  He is not exercising.  He does rarely walk on the treadmill.    No Known Allergies  Current Outpatient Prescriptions  Medication Sig Dispense Refill  . amiodarone (PACERONE) 200 MG tablet Take 1 tablet (200 mg total) by mouth daily.      [name] amLODipine (NORVASC) 10 MG tablet Take 5 mg by mouth daily as needed. Depends on BP      . Cholecalciferol (VITAMIN D-3 PO) Take 7,000 Units by mouth daily.      . clonazePAM (KLONOPIN) 1 MG tablet Take 1 mg by mouth at bedtime.        . Coenzyme Q10 (CO Q 10) 100 MG CAPS Take 1 capsule by mouth daily.        [name] DIGESTIVE ENZYMES PO Take by mouth daily.      . fish oil-omega-3 fatty acids 1000 MG capsule Take 3 g by mouth daily.      [name] levothyroxine (SYNTHROID, LEVOTHROID) 25 MCG tablet Take 25 mcg by mouth daily.       . Multiple Vitamin (MULTIVITAMIN) capsule Take 1 capsule by mouth daily.      . nitroGLYCERIN (NITROSTAT) 0.4 MG SL tablet Place 0.4 mg under the tongue every 5 (five) minutes as needed.        . pantoprazole (PROTONIX) 40 MG tablet Take 40 mg by mouth 2 (two) times daily.        . pravastatin (PRAVACHOL) 40 MG tablet Take 40 mg by mouth every evening.      . Prenatal Vit-Fe Fumarate-FA (M-VIT) tablet Take 1 tablet by mouth daily.        . traZODone (DESYREL) 50 MG tablet Take 150 mg by mouth at bedtime. 1/2 pill at bedtime      . warfarin (COUMADIN) 5 MG tablet Take per coumadin clinic instructions, dose changes according to INR, this is a 90 supply  105 tablet  1   No current facility-administered  medications for this visit.    Past Medical History  Diagnosis Date  . HYPOTHYROIDISM 05/20/2007  . HYPERLIPIDEMIA 10/20/2008  . DEPRESSION 12/17/2007  . MITRAL REGURGITATION 10/20/2008  . HYPERTENSION 04/10/2007  . CORONARY ARTERY DISEASE 04/10/2007  . Atrial fibrillation 04/12/2007  . GERD 04/10/2007  . DIVERTICULOSIS, COLON 04/10/2007  . BENIGN PROSTATIC HYPERTROPHY 04/10/2007  . GYNECOMASTIA, UNILATERAL 03/23/2010  . SLEEP APNEA 05/20/2007  . WEAKNESS 11/09/2009  . CHEST PAIN 05/11/2009  . Hematoma of abdominal wall   . Clotting disorder     anticoag. therapy  . Mallory - Weiss tear     > 5 years ago  . Barrett's esophagus     Past Surgical History  Procedure Laterality Date  . Transurethral resection of prostate    . Coronary angioplasty with stent placement    . Tee with cardioversion  7/08  . Cardioversion  8/08  . Esophagogastroduodenoscopy    . Tee without cardioversion  09/14/2011    Procedure: TRANSESOPHAGEAL ECHOCARDIOGRAM (TEE);  Surgeon: Wendall Stade, MD;  Location: [facility] ENDOSCOPY;  Service:  Cardiovascular;  Laterality: N/A;  . Cardioversion  09/14/2011    Procedure: CARDIOVERSION;  Surgeon: Wendall Stade, MD;  Location: [facility] ENDOSCOPY;  Service: Cardiovascular;  Laterality: N/A;    ROS:  As stated in the HPI and negative for all other systems.  PHYSICAL EXAM BP 150/82  Pulse 64  Ht 6' (1.829 m)  Wt 201 lb 3.2 oz (91.264 kg)  BMI 27.28 kg/m2 GENERAL: No acute distress HEENT:  Pupils equal round and reactive, fundi not visualized, oral mucosa unremarkable NECK:  No jugular venous distention, waveform within normal limits, carotid upstroke brisk and symmetric, no bruits, no thyromegaly LYMPHATICS:  No cervical, inguinal adenopathy LUNGS:  Clear to auscultation bilaterally BACK:  No CVA tenderness CHEST:  Unremarkable HEART:  PMI not displaced or sustained,S1 and S2 within normal limits, no S3, no S4, no clicks, no rubs, apical and axillary late systolic murmur ABD:   Flat, positive bowel sounds normal in frequency in pitch, no bruits, no rebound, no guarding, no midline pulsatile mass, no hepatomegaly, no splenomegaly EXT:  2 plus pulses throughout, no edema, no cyanosis no clubbing PSYCH:  Continued flat affect but not tearful today.   EKG:  Sinus bradycardia, rate 64, axis within normal limits, intervals within normal limits, no acute ST-T wave changes.   ASSESSMENT AND PLAN   CORONARY ARTERY DISEASE -  The patient has no new sypmtoms.  No further cardiovascular testing is indicated.  We will continue with aggressive risk reduction and meds as listed.  HYPERTENSION -  His blood pressure is slightly labile. He does take a PRN amlodipine if his blood pressure is running very high.    MITRAL REGURGITATION -  He has had only mild mitral regurgitation and a well preserved ejection fraction. There is no change in the exam.  No further imaging is needed at this time.   ATRIAL FIBRILLATION - He has had no recurrent dysrhythmias. I will continue with amiodarone and anticoagulation. He is going to see Rogelia Boga, MD next month. He says that labs will be ordered.  I will ask his primary MD to include TSH and liver enzymes.   FATIGUE - This continues to be a significant problem. His TSH will be checked as above. I am also sure that a CBC will be checked.  However, unfortunately fatigue is his baseline.

## 2012-10-09 NOTE — Patient Instructions (Addendum)
The current medical regimen is effective;  continue present plan and medications.   You have been referred to see Dr Craige Cotta for the treatment of sleep apnea.  Follow up in 6 months with Dr Antoine Poche.  You will receive a letter in the mail 2 months before you are due.  Please call us when you receive this letter to schedule your follow up appointment.

## 2012-10-16 ENCOUNTER — Telehealth: Payer: Self-pay | Admitting: *Deleted

## 2012-10-16 NOTE — Telephone Encounter (Signed)
Called pt told him received paperwork from [facility] regarding CPAP machine and getting new supplies. Asked pt if he has a CPAP machine? Pt stated yes has had one for about 6 years. Told pt we are not aware that he has one and who ordered it? Pt stated Dr. Craige Cotta and Dr. Antoine Poche are the providers. Told pt okay I will contact [facility] and let them know need to send paperwork to them. Pt verbalized understanding.    Called Merla at [facility] [phone] and told her need to sent paperwork for CPAP machine to Dr. Craige Cotta and Dr. Antoine Poche they are the providers that can provide information needed. Merla verbalized understanding.

## 2012-10-17 ENCOUNTER — Ambulatory Visit (INDEPENDENT_AMBULATORY_CARE_PROVIDER_SITE_OTHER): Payer: Medicare Other | Admitting: Pharmacist

## 2012-10-17 DIAGNOSIS — Z7901 Long term (current) use of anticoagulants: Secondary | ICD-10-CM | POA: Diagnosis not present

## 2012-10-17 DIAGNOSIS — I4891 Unspecified atrial fibrillation: Secondary | ICD-10-CM | POA: Diagnosis not present

## 2012-10-17 LAB — POCT INR: INR: 1.2

## 2012-10-21 DIAGNOSIS — L723 Sebaceous cyst: Secondary | ICD-10-CM | POA: Diagnosis not present

## 2012-10-24 ENCOUNTER — Ambulatory Visit (INDEPENDENT_AMBULATORY_CARE_PROVIDER_SITE_OTHER): Payer: Medicare Other | Admitting: *Deleted

## 2012-10-24 ENCOUNTER — Encounter: Payer: Self-pay | Admitting: *Deleted

## 2012-10-24 DIAGNOSIS — Z7901 Long term (current) use of anticoagulants: Secondary | ICD-10-CM | POA: Diagnosis not present

## 2012-10-24 DIAGNOSIS — I4891 Unspecified atrial fibrillation: Secondary | ICD-10-CM

## 2012-10-24 LAB — POCT INR: INR: 2.9

## 2012-11-04 ENCOUNTER — Encounter: Payer: Self-pay | Admitting: Pulmonary Disease

## 2012-11-04 ENCOUNTER — Ambulatory Visit (INDEPENDENT_AMBULATORY_CARE_PROVIDER_SITE_OTHER): Payer: Medicare Other | Admitting: Pulmonary Disease

## 2012-11-04 VITALS — BP 122/64 | HR 64 | Temp 97.8°F | Ht 71.5 in | Wt 200.8 lb

## 2012-11-04 DIAGNOSIS — IMO0002 Reserved for concepts with insufficient information to code with codable children: Secondary | ICD-10-CM | POA: Diagnosis not present

## 2012-11-04 DIAGNOSIS — G4733 Obstructive sleep apnea (adult) (pediatric): Secondary | ICD-10-CM

## 2012-11-04 DIAGNOSIS — G4750 Parasomnia, unspecified: Secondary | ICD-10-CM | POA: Insufficient documentation

## 2012-11-04 NOTE — Patient Instructions (Signed)
Will arrange for sleep study Will call to arrange for follow up after sleep study reviewed 

## 2012-11-04 NOTE — Assessment & Plan Note (Signed)
He will need repeat sleep study to assess current status of sleep apnea, and then determine what additional supplies he needs to get set up.

## 2012-11-04 NOTE — Progress Notes (Signed)
Chief Complaint  Patient presents with  . Advice Only    Last seen 2008 by [facility]. pt currently on CPAP. wears it everynight. has had current machine x 6 years. he needs new machine.     History of Present Illness: [name] is a 79 y.o. male with OSA and NREM parasomnia.  I last saw him in 2008.  He has been using CPAP.  His machine is old, and does not work anymore.  He goes to bed at 9 pm after taking his klonopin.  He falls asleep after 30 minutes.  He wakes up several times per night.  He gets out of bed at 8 am.  He will nap for about 1 hour during the day, but does not use his CPAP when he naps.  His Epworth score is 9 out of 24.  The patient denies sleep walking, sleep talking, bruxism, or nightmares.  There is no history of restless legs.  The patient denies sleep hallucinations, sleep paralysis, or cataplexy.  TESTS:   [name]  has a past medical history of HYPOTHYROIDISM (05/20/2007); HYPERLIPIDEMIA (10/20/2008); DEPRESSION (12/17/2007); MITRAL REGURGITATION (10/20/2008); HYPERTENSION (04/10/2007); CORONARY ARTERY DISEASE (04/10/2007); Atrial fibrillation (04/12/2007); GERD (04/10/2007); DIVERTICULOSIS, COLON (04/10/2007); BENIGN PROSTATIC HYPERTROPHY (04/10/2007); GYNECOMASTIA, UNILATERAL (03/23/2010); SLEEP APNEA (05/20/2007); WEAKNESS (11/09/2009); CHEST PAIN (05/11/2009); Hematoma of abdominal wall; Clotting disorder; Mallory - Weiss tear; and Barrett's esophagus.  [name]  has past surgical history that includes Transurethral resection of prostate; Coronary angioplasty with stent; TEE with cardioversion (7/08); cardioversion (8/08); Esophagogastroduodenoscopy; TEE without cardioversion (09/14/2011); and Cardioversion (09/14/2011).  Prior to Admission medications   Medication Sig Start Date End Date Taking? Authorizing Provider  amiodarone (PACERONE) 200 MG tablet Take 1 tablet (200 mg total) by mouth daily. 11/03/11 11/04/12 Yes Rollene Rotunda, MD  amLODipine (NORVASC) 10 MG tablet  Take 5 mg by mouth daily as needed. Depends on BP   Yes Historical Provider, MD  Cholecalciferol (VITAMIN D-3 PO) Take 7,000 Units by mouth daily.   Yes Historical Provider, MD  clonazePAM (KLONOPIN) 1 MG tablet Take 1 mg by mouth at bedtime.     Yes Historical Provider, MD  Coenzyme Q10 (CO Q 10) 100 MG CAPS Take 1 capsule by mouth daily.     Yes Historical Provider, MD  DIGESTIVE ENZYMES PO Take by mouth daily.   Yes Historical Provider, MD  fish oil-omega-3 fatty acids 1000 MG capsule Take 3 g by mouth daily.   Yes Historical Provider, MD  levothyroxine (SYNTHROID, LEVOTHROID) 25 MCG tablet Take 25 mcg by mouth daily.    Yes Historical Provider, MD  Multiple Vitamin (MULTIVITAMIN) capsule Take 1 capsule by mouth daily.   Yes Historical Provider, MD  nitroGLYCERIN (NITROSTAT) 0.4 MG SL tablet Place 0.4 mg under the tongue every 5 (five) minutes as needed.     Yes Historical Provider, MD  pantoprazole (PROTONIX) 40 MG tablet Take 40 mg by mouth 2 (two) times daily.     Yes Historical Provider, MD  pravastatin (PRAVACHOL) 40 MG tablet Take 40 mg by mouth every evening. 03/07/11  Yes Dyann Kief, PA-C  Prenatal Vit-Fe Fumarate-FA (M-VIT) tablet Take 1 tablet by mouth daily.     Yes Historical Provider, MD  traZODone (DESYREL) 50 MG tablet Take 150 mg by mouth at bedtime. 1/2 pill at bedtime   Yes Historical Provider, MD  warfarin (COUMADIN) 5 MG tablet Take per coumadin clinic instructions, dose changes according to INR, this is a 90 supply 10/09/12  Yes Rollene Rotunda, MD    No Known Allergies  Family History  Problem Relation Age of Onset  . Hypertension Other   . Heart disease Brother     CAD male 1st degree relative    History   Social History  . Marital Status: Married    Spouse Name: N/A    Number of Children: 1  . Years of Education: N/A   Occupational History  . retired    Social History Main Topics  . Smoking status: Never Smoker   . Smokeless tobacco: Never Used  .  Alcohol Use: No  . Drug Use: No  . Sexually Active: Not on file   Other Topics Concern  . Not on file   Social History Narrative   He is retired from [employer].   Mother lived into her 18's.  Father died in his 49's.  No CAD.     Review of Systems  Constitutional: Negative for fever, appetite change and unexpected weight change.  HENT: Negative for ear pain, congestion, sore throat, sneezing, trouble swallowing and dental problem.   Respiratory: Positive for shortness of breath. Negative for cough.   Cardiovascular: Negative for chest pain and leg swelling.  Gastrointestinal: Negative for nausea and abdominal pain.  Musculoskeletal: Negative for joint swelling.  Skin: Negative for rash.  Neurological: Negative for headaches.  Psychiatric/Behavioral: Negative for dysphoric mood. The patient is not nervous/anxious.    Physical Exam:  General - No distress ENT - No sinus tenderness, no oral exudate, no LAN Cardiac - s1s2 regular, no murmur Chest - No wheeze/rales/dullness Back - No focal tenderness Abd - Soft, non-tender Ext - No edema Neuro - Normal strength Skin - No rashes Psych - normal mood, and behavior   Assessment/Plan:  Coralyn Helling, MD [place] [facility]/Critical Care/Sleep Pager:  [phone]

## 2012-11-04 NOTE — Progress Notes (Signed)
  Subjective:    Patient ID: [name], male    DOB: 30-Dec-1926, 79 y.o.   MRN: [id number]  HPI    Review of Systems  Constitutional: Negative for fever, appetite change and unexpected weight change.  HENT: Negative for ear pain, congestion, sore throat, sneezing, trouble swallowing and dental problem.   Respiratory: Positive for shortness of breath. Negative for cough.   Cardiovascular: Negative for chest pain and leg swelling.  Gastrointestinal: Negative for nausea and abdominal pain.  Musculoskeletal: Negative for joint swelling.  Skin: Negative for rash.  Neurological: Negative for headaches.  Psychiatric/Behavioral: Negative for dysphoric mood. The patient is not nervous/anxious.        Objective:   Physical Exam        Assessment & Plan:

## 2012-11-04 NOTE — Assessment & Plan Note (Signed)
He has a NREM parasomnia controlled on [facility] 

## 2012-11-07 ENCOUNTER — Ambulatory Visit (INDEPENDENT_AMBULATORY_CARE_PROVIDER_SITE_OTHER): Payer: Medicare Other | Admitting: *Deleted

## 2012-11-07 DIAGNOSIS — Z7901 Long term (current) use of anticoagulants: Secondary | ICD-10-CM

## 2012-11-07 DIAGNOSIS — I4891 Unspecified atrial fibrillation: Secondary | ICD-10-CM | POA: Diagnosis not present

## 2012-11-14 ENCOUNTER — Ambulatory Visit (HOSPITAL_BASED_OUTPATIENT_CLINIC_OR_DEPARTMENT_OTHER): Payer: Medicare Other | Attending: Pulmonary Disease

## 2012-11-14 DIAGNOSIS — IMO0002 Reserved for concepts with insufficient information to code with codable children: Secondary | ICD-10-CM | POA: Diagnosis not present

## 2012-11-14 DIAGNOSIS — I498 Other specified cardiac arrhythmias: Secondary | ICD-10-CM | POA: Diagnosis not present

## 2012-11-14 DIAGNOSIS — G4733 Obstructive sleep apnea (adult) (pediatric): Secondary | ICD-10-CM | POA: Insufficient documentation

## 2012-11-14 DIAGNOSIS — G4750 Parasomnia, unspecified: Secondary | ICD-10-CM

## 2012-11-20 ENCOUNTER — Encounter: Payer: Medicare Other | Admitting: Internal Medicine

## 2012-11-20 DIAGNOSIS — Z0289 Encounter for other administrative examinations: Secondary | ICD-10-CM

## 2012-11-21 ENCOUNTER — Telehealth: Payer: Self-pay | Admitting: Pulmonary Disease

## 2012-11-21 DIAGNOSIS — G4733 Obstructive sleep apnea (adult) (pediatric): Secondary | ICD-10-CM | POA: Diagnosis not present

## 2012-11-21 NOTE — Telephone Encounter (Signed)
I spoke with pt and is scheduled to come in

## 2012-11-21 NOTE — Telephone Encounter (Signed)
PSG 11/14/12 >> AHI 3, SpO2 low 89%, PLMI 0.  Reduced sleep time, no R or S sleep.  Will have my nurse schedule ROV to review results.

## 2012-11-22 ENCOUNTER — Encounter: Payer: Self-pay | Admitting: Pulmonary Disease

## 2012-11-22 ENCOUNTER — Ambulatory Visit (INDEPENDENT_AMBULATORY_CARE_PROVIDER_SITE_OTHER): Payer: Medicare Other | Admitting: Pulmonary Disease

## 2012-11-22 VITALS — BP 120/70 | HR 66 | Temp 97.6°F | Ht 71.5 in

## 2012-11-22 DIAGNOSIS — G4733 Obstructive sleep apnea (adult) (pediatric): Secondary | ICD-10-CM | POA: Diagnosis not present

## 2012-11-22 DIAGNOSIS — IMO0002 Reserved for concepts with insufficient information to code with codable children: Secondary | ICD-10-CM

## 2012-11-22 DIAGNOSIS — G4700 Insomnia, unspecified: Secondary | ICD-10-CM | POA: Diagnosis not present

## 2012-11-22 DIAGNOSIS — G4750 Parasomnia, unspecified: Secondary | ICD-10-CM

## 2012-11-22 MED ORDER — ZOLPIDEM TARTRATE 5 MG PO TABS: 5.0000 mg | ORAL_TABLET | Freq: Every evening | ORAL | Status: DC | PRN

## 2012-11-22 NOTE — Progress Notes (Signed)
Chief Complaint  Patient presents with  . Follow-up    pt here to discuss sleep study results.     History of Present Illness: [name] is a 78 y.o. male with NREM parasomnia and OSA.  He is here to review his sleep study from May 1.  This did not show sleep apnea.  He had considerable trouble with sleep initiation and sleep maintenance.  He also was not observed in REM sleep or supine sleep.  He is very concerned that he still has sleep apnea, and that he did not have sufficient sleep during test to assess for this.  He reports that he can't sleep at home w/o using his CPAP machine.  TESTS: PSG 12/08/05 >> AHI 0.8, SpO2 low 85%, UARS, Central events with sleep onset PSG 09/26/06 >> AHI 0.8, SpO2 low 89%, UARS, Central events with sleep onset ASV titration 01/07/07 PSG 11/14/12 >> AHI 3, SpO2 low 89%, PLMI 0.  Reduced sleep time, no R or S sleep  [name]  has a past medical history of HYPOTHYROIDISM (05/20/2007); HYPERLIPIDEMIA (10/20/2008); DEPRESSION (12/17/2007); MITRAL REGURGITATION (10/20/2008); HYPERTENSION (04/10/2007); CORONARY ARTERY DISEASE (04/10/2007); Atrial fibrillation (04/12/2007); GERD (04/10/2007); DIVERTICULOSIS, COLON (04/10/2007); BENIGN PROSTATIC HYPERTROPHY (04/10/2007); GYNECOMASTIA, UNILATERAL (03/23/2010); SLEEP APNEA (05/20/2007); WEAKNESS (11/09/2009); CHEST PAIN (05/11/2009); Hematoma of abdominal wall; Clotting disorder; Mallory - Weiss tear; and Barrett's esophagus.  [name]  has past surgical history that includes Transurethral resection of prostate; Coronary angioplasty with stent; TEE with cardioversion (7/08); cardioversion (8/08); Esophagogastroduodenoscopy; TEE without cardioversion (09/14/2011); and Cardioversion (09/14/2011).  Prior to Admission medications   Medication Sig Start Date End Date Taking? Authorizing Provider  amiodarone (PACERONE) 200 MG tablet Take 1 tablet (200 mg total) by mouth daily. 11/03/11 11/04/12 Yes Rollene Rotunda, MD  amLODipine  (NORVASC) 10 MG tablet Take 5 mg by mouth daily as needed. Depends on BP   Yes Historical Provider, MD  Cholecalciferol (VITAMIN D-3 PO) Take 7,000 Units by mouth daily.   Yes Historical Provider, MD  clonazePAM (KLONOPIN) 1 MG tablet Take 1 mg by mouth at bedtime.     Yes Historical Provider, MD  Coenzyme Q10 (CO Q 10) 100 MG CAPS Take 1 capsule by mouth daily.     Yes Historical Provider, MD  DIGESTIVE ENZYMES PO Take by mouth daily.   Yes Historical Provider, MD  fish oil-omega-3 fatty acids 1000 MG capsule Take 3 g by mouth daily.   Yes Historical Provider, MD  levothyroxine (SYNTHROID, LEVOTHROID) 25 MCG tablet Take 25 mcg by mouth daily.    Yes Historical Provider, MD  Multiple Vitamin (MULTIVITAMIN) capsule Take 1 capsule by mouth daily.   Yes Historical Provider, MD  nitroGLYCERIN (NITROSTAT) 0.4 MG SL tablet Place 0.4 mg under the tongue every 5 (five) minutes as needed.     Yes Historical Provider, MD  pantoprazole (PROTONIX) 40 MG tablet Take 40 mg by mouth 2 (two) times daily.     Yes Historical Provider, MD  pravastatin (PRAVACHOL) 40 MG tablet Take 40 mg by mouth every evening. 03/07/11  Yes Dyann Kief, PA-C  Prenatal Vit-Fe Fumarate-FA (M-VIT) tablet Take 1 tablet by mouth daily.     Yes Historical Provider, MD  traZODone (DESYREL) 50 MG tablet Take 150 mg by mouth at bedtime. 1/2 pill at bedtime   Yes Historical Provider, MD  warfarin (COUMADIN) 5 MG tablet Take per coumadin clinic instructions, dose changes according to INR, this is a 90 supply 10/09/12  Yes Rollene Rotunda,  MD    No Known Allergies   Physical Exam:  General - No distress ENT - No sinus tenderness, no oral exudate, no LAN Cardiac - s1s2 regular, no murmur Chest - No wheeze/rales/dullness Back - No focal tenderness Abd - Soft, non-tender Ext - No edema Neuro - Normal strength Skin - No rashes Psych - normal mood, and behavior   Assessment/Plan:  Coralyn Helling, MD [place] [facility]/Critical  Care/Sleep Pager:  [phone]

## 2012-11-22 NOTE — Patient Instructions (Signed)
Will arrange for sleep study Will call to arrange for follow up after sleep study reviewed Take zolpidem (ambien) 5 mg pill on night of sleep study

## 2012-11-22 NOTE — Procedures (Signed)
NAME:  [name], [name] NO.:  [id number]  MEDICAL RECORD NO.:  [id number]          PATIENT TYPE:  OUT  LOCATION:  SLEEP CENTER                 FACILITY:  [facility]  PHYSICIAN:  Coralyn Helling, MD        DATE OF BIRTH:  22-Jan-1927  DATE OF STUDY:  11/14/2012                           NOCTURNAL POLYSOMNOGRAM  REFERRING PHYSICIAN:  Coralyn Helling, MD  INDICATION FOR STUDY:  [name] is an 79 year old male who has a history of coronary artery disease and non-REM parasomnia.  He has a previous diagnosis of obstructive sleep apnea and has been on CPAP therapy.  He was referred to the sleep lab to determine whether he still has sleep apnea.  Height is 5 feet 11 inches, weight is 195 pounds, BMI is 27, neck size is 16 inches.  EPWORTH SLEEPINESS SCORE:  3.  MEDICATIONS:  Amiodarone, amlodipine, vitamin D, Klonopin, Coenzyme Q10, fish oil, levothyroxine, pantoprazole, pravastatin, trazodone, and warfarin.  The patient took trazodone and Klonopin on the night of the study.  SLEEP ARCHITECTURE:  Total recording time was 386 minutes.  Total sleep time was 138 minutes.  Sleep efficiency was 35%, sleep latency was 19 minutes.  REM latency was not applicable.  The study was notable for lack of slow-wave sleep and REM sleep, and the patient slept exclusively in the nonsupine position.  RESPIRATORY DATA:  The average respiratory rate was 16.  Loud snoring was noted by the technician.  The overall apnea/hypopnea index was 3. The events were exclusively obstructive in nature.  OXYGEN DATA:  The baseline oxygenation was 93%.  The oxygen saturation nadir was 89%.  The study was conducted without the use of supplemental oxygen.  CARDIAC DATA:  The average heart rate was 68 and the rhythm strip showed sinus rhythm with sinus arrhythmia, occasional PVCs.  MOVEMENT-PARASOMNIA:  The periodic limb movement index was 0.  The patient had 3 restroom trips.  IMPRESSION:  This study did not  show evidence for obstructive sleep apnea.  However, he had minimal sleep time and was not observed in supine sleep or REM sleep.  Therefore, the presence of sleep apnea could be underestimated.  If there is no strong clinical suspicion that the patient has sleep- disordered breathing, then the patient could be referred back to Sleep Lab with the use of a additional sleep aid to better consolidate his sleep pattern which would give a better reading regarding whether he does have sleep-disordered breathing.     Coralyn Helling, MD Diplomat, American Board of Sleep Medicine    VS/MEDQ  D:  11/21/2012 08:40:44  T:  11/22/2012 00:13:04  Job:  [id number]

## 2012-11-25 DIAGNOSIS — G4700 Insomnia, unspecified: Secondary | ICD-10-CM | POA: Insufficient documentation

## 2012-11-25 NOTE — Assessment & Plan Note (Signed)
He is to continue trazodone nightly.

## 2012-11-25 NOTE — Assessment & Plan Note (Signed)
He has a NREM parasomnia controlled on [facility] 

## 2012-11-25 NOTE — Assessment & Plan Note (Signed)
He has been maintained on PAP therapy since 2008 for central sleep apnea of sleep onset and UARS.    I reviewed his most recent sleep study.  I explained that based on current sleep study he would not qualify for continued use of PAP therapy.  Main concern is that he had very little, consolidated sleep time and was not observed in REM or supine sleep.  As a result presence of sleep apnea may be underestimated.  As such, explained that he may not qualify for insurance continued coverage of PAP supplies.  At this time will proceed with repeat sleep study.  Will have him use 5 mg zolpidem on the night of the study to help with sleep onset and consolidation.  Will then determine if he needs to continue with PAP therapy.

## 2012-11-28 ENCOUNTER — Ambulatory Visit (INDEPENDENT_AMBULATORY_CARE_PROVIDER_SITE_OTHER): Payer: Medicare Other | Admitting: *Deleted

## 2012-11-28 DIAGNOSIS — I4891 Unspecified atrial fibrillation: Secondary | ICD-10-CM | POA: Diagnosis not present

## 2012-11-28 DIAGNOSIS — Z7901 Long term (current) use of anticoagulants: Secondary | ICD-10-CM | POA: Diagnosis not present

## 2012-11-28 LAB — POCT INR: INR: 2.7

## 2012-12-12 ENCOUNTER — Ambulatory Visit (HOSPITAL_BASED_OUTPATIENT_CLINIC_OR_DEPARTMENT_OTHER): Payer: Medicare Other | Attending: Pulmonary Disease

## 2012-12-12 DIAGNOSIS — G4733 Obstructive sleep apnea (adult) (pediatric): Secondary | ICD-10-CM | POA: Diagnosis not present

## 2012-12-13 DIAGNOSIS — G4733 Obstructive sleep apnea (adult) (pediatric): Secondary | ICD-10-CM

## 2012-12-14 NOTE — Procedures (Signed)
NAME:  [name], WHACK NO.:  [id number]  MEDICAL RECORD NO.:  [id number]          PATIENT TYPE:  OUT  LOCATION:  SLEEP CENTER                 [facility]ACILITY:  [facility]  PHYSICIAN:  Coralyn Helling, MD        DATE OF BIRTH:  08/31/1926  DATE OF STUDY:  12/12/2012                           NOCTURNAL POLYSOMNOGRAM  REFERRING PHYSICIAN:  Coralyn Helling, MD  INDICATION FOR STUDY:  Mr. Lepak is an 79 year old male who has a previous history of obstructive sleep apnea.  He continued to have snoring, sleep disruption, and daytime sleepiness.  He is referred back to Sleep Lab for further evaluation of his hypersomnia with obstructive sleep apnea.  Height is 5 feet 11 inches, weight is 195 pounds, BMI is 27, neck size is 16 inches.  EPWORTH SLEEPINESS SCORE:  8.  MEDICATIONS:  Medications are reviewed in his medical record.  The patient took a zolpidem on the night of the study.  SLEEP ARCHITECTURE:  Total recording time was 435 minutes, total sleep time was 368 minutes, sleep efficiency was 85%, sleep latency 3 minutes, REM latency 54 minutes.  The study was notable for lack of slow-wave sleep.  He slept predominately in the non-supine position.  RESPIRATORY DATA:  The average respiratory rate was 16.  Loud snoring was noted by the technician.  The overall apnea-hypopnea index was 8.2. There were 10 central apneic events.  There were 10 mixed respiratory events.  The remainder of the events were obstructive in nature.  OXYGEN DATA:  The baseline oxygenation was 98%.  The oxygen saturation nadir was 80%.  The study was conducted without the use of supplemental oxygen.  CARDIAC DATA:  The average heart rate is 66 and the rhythm strip showed occasional PVCs.  MOVEMENT-PARASOMNIA:  The patient had 2 restroom trips.  The periodic limb movement index was 0.8.  IMPRESSIONS-RECOMMENDATIONS:  This study shows evidence for mild obstructive sleep apnea with an apnea/hypopnea index of  8.2.  He had both obstructive and central respiratory events.  He also had a significant positional effect to his sleep-disordered breathing.  Additional therapeutic interventions at this time could include CPAP therapy, oral appliance, or surgical intervention.     Coralyn Helling, MD Diplomat, American Board of Sleep Medicine    VS/MEDQ  D:  12/13/2012 16:36:55  T:  12/14/2012 01:16:35  Job:  [id number]

## 2012-12-17 ENCOUNTER — Telehealth: Payer: Self-pay | Admitting: Pulmonary Disease

## 2012-12-17 DIAGNOSIS — G4733 Obstructive sleep apnea (adult) (pediatric): Secondary | ICD-10-CM

## 2012-12-17 NOTE — Telephone Encounter (Signed)
PSG 12/12/12 >> AHI 8.2, SpO2 low 88%, Supine AHI 25.4.  Results d/w pt.  Will arrange for auto CPAP set up.    Will have my nurse schedule ROV 2 months after CPAP set up.

## 2012-12-17 NOTE — Telephone Encounter (Signed)
Patient returning call and wanting to inform us that he uses [facility] as DME.

## 2012-12-19 NOTE — Telephone Encounter (Signed)
Spoke to pt. He states that he will have to call us back and schedule the 2 month ROV. [place] the beach and doesn't have his calendar with him.

## 2012-12-23 NOTE — Telephone Encounter (Signed)
Spoke to pt. He has been scheduled for 01/24/13 @ 1:45pm.

## 2012-12-26 ENCOUNTER — Ambulatory Visit (INDEPENDENT_AMBULATORY_CARE_PROVIDER_SITE_OTHER): Payer: Medicare Other | Admitting: *Deleted

## 2012-12-26 ENCOUNTER — Encounter: Payer: Self-pay | Admitting: *Deleted

## 2012-12-26 DIAGNOSIS — Z7901 Long term (current) use of anticoagulants: Secondary | ICD-10-CM | POA: Diagnosis not present

## 2012-12-26 DIAGNOSIS — I4891 Unspecified atrial fibrillation: Secondary | ICD-10-CM | POA: Diagnosis not present

## 2012-12-27 ENCOUNTER — Telehealth: Payer: Self-pay | Admitting: Pulmonary Disease

## 2012-12-27 NOTE — Telephone Encounter (Signed)
I spoke with pt. He stated he took his old machine over to [facility] wed ( we sent oder for machine on 12/17/12 for new cpap) and was told they could not set him up with a new machine until we called there office. Per pt he had [facility] in the past and would like to go back to them. He liked how they handled business and treated him. He still has not gotten a new CPAP machine. Please advise PCCs thanks

## 2012-12-30 NOTE — Telephone Encounter (Signed)
[email or web address]  contacted me back and will work on taking this pt back for cpap and supplies Tobe Sos

## 2012-12-30 NOTE — Telephone Encounter (Signed)
Sent message to [email or web address]  to see if they can take pt back [name]

## 2013-01-01 ENCOUNTER — Telehealth: Payer: Self-pay | Admitting: Pulmonary Disease

## 2013-01-01 NOTE — Telephone Encounter (Signed)
BiPAP 11/13/12 to 11/21/12 >> Used on 9 of 9 nights with average 7 hrs 6 min.  Median IPAP 12 cm H2O, 95 th percentile IPAP 16 cm H2O.  Median EPAP 7 cm H2O, 95 th percentile EPAP 7 cm H2O.  Will have my nurse inform pt that BiPAP report shows good usage of device.  Will discuss in more detail at [facility] on 01/24/13.

## 2013-01-02 NOTE — Telephone Encounter (Signed)
Pt is aware of download results. Confirmed 01/24/13 appointment with pt. Nothing further was needed.

## 2013-01-09 ENCOUNTER — Ambulatory Visit (INDEPENDENT_AMBULATORY_CARE_PROVIDER_SITE_OTHER): Payer: Medicare Other | Admitting: Internal Medicine

## 2013-01-09 ENCOUNTER — Encounter: Payer: Self-pay | Admitting: Internal Medicine

## 2013-01-09 VITALS — BP 150/80 | HR 57 | Temp 97.8°F | Resp 20 | Ht 68.5 in | Wt 199.0 lb

## 2013-01-09 DIAGNOSIS — Z7901 Long term (current) use of anticoagulants: Secondary | ICD-10-CM

## 2013-01-09 DIAGNOSIS — I10 Essential (primary) hypertension: Secondary | ICD-10-CM

## 2013-01-09 DIAGNOSIS — E039 Hypothyroidism, unspecified: Secondary | ICD-10-CM | POA: Diagnosis not present

## 2013-01-09 DIAGNOSIS — Z0000 Encounter for general adult medical examination without abnormal findings: Secondary | ICD-10-CM | POA: Diagnosis not present

## 2013-01-09 DIAGNOSIS — I2510 Atherosclerotic heart disease of native coronary artery without angina pectoris: Secondary | ICD-10-CM

## 2013-01-09 DIAGNOSIS — I4891 Unspecified atrial fibrillation: Secondary | ICD-10-CM

## 2013-01-09 NOTE — Progress Notes (Signed)
Subjective:    Patient ID: [name], male    DOB: 02/27/1927, 79 y.o.   MRN: [id number]  HPI 79 year old patient who is seen today for a preventive health examination. He is followed closely by cardiology pulmonary medicine and also urology. Additionally he is followed at [facility] system. He has had recent laboratory studies done at their facility.  He has a history of paroxysmal atrial fibrillation and remains on chronic Coumadin anticoagulation. He is doing quite well. He is now walking 2-3 miles daily on an indoor track and feels better than he has in some time.    Allergies:  No Known Drug Allergies    Past History:  Past Medical History:  Reviewed history from 06/14/2008 and no changes required:  DJD  Coronary artery disease (s/p inferior MI April 2006 with stenting to proximal and distal RCA)  Diverticulosis, colon  GERD  Hypertension  Benign prostatic hypertrophy  Depression  Anticoagulation therapy  Atrial fibrillation  Sleep Apnea (on CPAP)  Mitral regurgitation  Hypothyroidism   Past Surgical History:  Reviewed history from 04/12/2007 and no changes required:  Transurethral resection of prostate  angioplasty  cardioversion for paroxysmal atrial fibrillation   Family History:  Reviewed history from 04/10/2007 and no changes required:  Family History of CAD Male 1st degree relative <50  Family History Hypertension   Social History:  Reviewed history from 04/10/2007 and no changes required:  Retired  Married  Never Smoked  Alcohol use-no  Past Medical History  Diagnosis Date  . HYPOTHYROIDISM 05/20/2007  . HYPERLIPIDEMIA 10/20/2008  . DEPRESSION 12/17/2007  . MITRAL REGURGITATION 10/20/2008  . HYPERTENSION 04/10/2007  . CORONARY ARTERY DISEASE 04/10/2007  . Atrial fibrillation 04/12/2007  . GERD 04/10/2007  . DIVERTICULOSIS, COLON 04/10/2007  . BENIGN PROSTATIC HYPERTROPHY 04/10/2007  . GYNECOMASTIA, UNILATERAL 03/23/2010  . SLEEP APNEA 05/20/2007  .  WEAKNESS 11/09/2009  . CHEST PAIN 05/11/2009  . Hematoma of abdominal wall   . Clotting disorder     anticoag. therapy  . Mallory - Weiss tear     > 5 years ago  . Barrett's esophagus     History   Social History  . Marital Status: Married    Spouse Name: N/A    Number of Children: 1  . Years of Education: N/A   Occupational History  . retired    Social History Main Topics  . Smoking status: Never Smoker   . Smokeless tobacco: Never Used  . Alcohol Use: No  . Drug Use: No  . Sexually Active: Not on file   Other Topics Concern  . Not on file   Social History Narrative   He is retired from [employer] certified [employer].   Mother lived into her 61's.  Father died in his 71's.  No CAD.    Past Surgical History  Procedure Laterality Date  . Transurethral resection of prostate    . Coronary angioplasty with stent placement    . Tee with cardioversion  7/08  . Cardioversion  8/08  . Esophagogastroduodenoscopy    . Tee without cardioversion  09/14/2011    Procedure: TRANSESOPHAGEAL ECHOCARDIOGRAM (TEE);  Surgeon: Wendall Stade, MD;  Location: [facility] ENDOSCOPY;  Service: Cardiovascular;  Laterality: N/A;  . Cardioversion  09/14/2011    Procedure: CARDIOVERSION;  Surgeon: Wendall Stade, MD;  Location: [facility] ENDOSCOPY;  Service: Cardiovascular;  Laterality: N/A;    Family History  Problem Relation Age of Onset  . Hypertension Other   . Heart disease Brother  CAD male 1st degree relative    No Known Allergies  Current Outpatient Prescriptions on File Prior to Visit  Medication Sig Dispense Refill  . Cholecalciferol (VITAMIN D-3 PO) Take 7,000 Units by mouth daily.      . clonazePAM (KLONOPIN) 1 MG tablet Take 1 mg by mouth at bedtime.        . Coenzyme Q10 (CO Q 10) 100 MG CAPS Take 1 capsule by mouth daily.        [name] DIGESTIVE ENZYMES PO Take by mouth daily.      . fish oil-omega-3 fatty acids 1000 MG capsule Take 3 g by mouth daily.      [name] levothyroxine (SYNTHROID, LEVOTHROID) 25  MCG tablet Take 25 mcg by mouth daily.       . Multiple Vitamin (MULTIVITAMIN) capsule Take 1 capsule by mouth daily.      . nitroGLYCERIN (NITROSTAT) 0.4 MG SL tablet Place 0.4 mg under the tongue every 5 (five) minutes as needed.        . pantoprazole (PROTONIX) 40 MG tablet Take 40 mg by mouth 2 (two) times daily.        . pravastatin (PRAVACHOL) 40 MG tablet Take 40 mg by mouth every evening.      . traZODone (DESYREL) 50 MG tablet Take 150 mg by mouth at bedtime. 1/2 pill at bedtime      . warfarin (COUMADIN) 5 MG tablet Take per coumadin clinic instructions, dose changes according to INR, this is a 90 supply  90 tablet  1  . amiodarone (PACERONE) 200 MG tablet Take 1 tablet (200 mg total) by mouth daily.      [name] amLODipine (NORVASC) 10 MG tablet Take 5 mg by mouth daily as needed. Depends on BP       No current facility-administered medications on file prior to visit.    BP 150/80  Pulse 57  Temp(Src) 97.8 F (36.6 C) (Oral)  Resp 20  Ht 5' 8.5" (1.74 m)  Wt 199 lb (90.266 kg)  BMI 29.81 kg/m2  SpO2 98%  1. Risk factors, based on past  M,S,F history-  patient has known coronary artery disease risk factors include hypertension and dyslipidemia  2.  Physical activities: Retired mostly walking 2-3 miles daily on an indoor track  3.  Depression/mood: History depression but presently stable  4.  Hearing: Wears her days bilaterally; will send the receiving new equipment from the [facility] system  5.  ADL's: Independent in all aspects of daily living  6.  Fall risk: Low  7.  Home safety: No problems identified  8.  Height weight, and visual acuity; height and weight stable no change in visual acuity  9.  Counseling: Heart healthy diet regular exercise all encouraged  10. Lab orders based on risk factors: Laboratory studies show the [facility] system will be reviewed  11. Referral : Followup pulmonary cardiology and urology  12. Care plan: Continue present regimen  including Coumadin anticoagulation  13. Cognitive assessment: Alert and oriented normal affect. No cognitive dysfunction     Review of Systems  Constitutional: Negative for fever, chills, appetite change and fatigue.  HENT: Negative for hearing loss, ear pain, congestion, sore throat, trouble swallowing, neck stiffness, dental problem, voice change and tinnitus.   Eyes: Negative for pain, discharge and visual disturbance.  Respiratory: Negative for cough, chest tightness, wheezing and stridor.   Cardiovascular: Negative for chest pain, palpitations and leg swelling.  Gastrointestinal: Negative for nausea, vomiting,  abdominal pain, diarrhea, constipation, blood in stool and abdominal distention.  Genitourinary: Negative for urgency, hematuria, flank pain, discharge, difficulty urinating and genital sores.  Musculoskeletal: Negative for myalgias, back pain, joint swelling, arthralgias and gait problem.  Skin: Negative for rash.  Neurological: Negative for dizziness, syncope, speech difficulty, weakness, numbness and headaches.  Hematological: Negative for adenopathy. Does not bruise/bleed easily.  Psychiatric/Behavioral: Negative for behavioral problems and dysphoric mood. The patient is not nervous/anxious.        Objective:   Physical Exam  Constitutional: He appears well-developed and well-nourished.  HENT:  Head: Normocephalic and atraumatic.  Right Ear: External ear normal.  Left Ear: External ear normal.  Nose: Nose normal.  Mouth/Throat: Oropharynx is clear and moist.  Eyes: Conjunctivae and EOM are normal. Pupils are equal, round, and reactive to light. No scleral icterus.  Neck: Normal range of motion. Neck supple. No JVD present. No thyromegaly present.  Cardiovascular: Regular rhythm, normal heart sounds and intact distal pulses.  Exam reveals no gallop and no friction rub.   No murmur heard. Diminished  left dorsalis pedis pulse  Pulmonary/Chest: Effort normal and  breath sounds normal. He exhibits no tenderness.  Abdominal: Soft. Bowel sounds are normal. He exhibits no distension and no mass. There is no tenderness.  Genitourinary: Penis normal.  Musculoskeletal: Normal range of motion. He exhibits no edema and no tenderness.  Lymphadenopathy:    He has no cervical adenopathy.  Neurological: He is alert. He has normal reflexes. No cranial nerve deficit. Coordination normal.  Skin: Skin is warm and dry. No rash noted.  Psychiatric: He has a normal mood and affect. His behavior is normal.          Assessment & Plan:   Preventive health examination  Coronary artery disease  Paroxysmal atrial fibrillation  Hypothyroidism  Hypertension well controlled  Dyslipidemia. Continue pravastatin   Recheck 6 months

## 2013-01-09 NOTE — Progress Notes (Signed)
Patient ID: [name], male   DOB: [birth year], 79 y.o.   MRN: [id number]

## 2013-01-09 NOTE — Patient Instructions (Addendum)
Limit your sodium (Salt) intake    It is important that you exercise regularly, at least 20 minutes 3 to 4 times per week.  If you develop chest pain or shortness of breath seek  medical attention.  Return in 6 months for follow-up  

## 2013-01-20 ENCOUNTER — Telehealth: Payer: Self-pay | Admitting: Pulmonary Disease

## 2013-01-20 ENCOUNTER — Telehealth: Payer: Self-pay | Admitting: *Deleted

## 2013-01-20 NOTE — Telephone Encounter (Signed)
Spoke to pt told him Dr. Kirtland Bouchard reviewed lab work and said needs to follow up with office visit in 4 weeks and repeat CBC. Told pt to call and make an appointment for end of next week, labs were done 01/01/2013. Pt verbalized understanding.

## 2013-01-20 NOTE — Telephone Encounter (Signed)
LM with Mandy. I advised her that we did receive a report on the pt but I wasn't 100% sure it was the ASV download. We don't have the report anymore, I believe it was sent to be scanned.

## 2013-01-23 ENCOUNTER — Encounter: Payer: Self-pay | Admitting: *Deleted

## 2013-01-23 ENCOUNTER — Ambulatory Visit (INDEPENDENT_AMBULATORY_CARE_PROVIDER_SITE_OTHER): Payer: Medicare Other | Admitting: *Deleted

## 2013-01-23 DIAGNOSIS — Z7901 Long term (current) use of anticoagulants: Secondary | ICD-10-CM | POA: Diagnosis not present

## 2013-01-23 DIAGNOSIS — I4891 Unspecified atrial fibrillation: Secondary | ICD-10-CM

## 2013-01-23 LAB — POCT INR: INR: 2.5

## 2013-01-24 ENCOUNTER — Ambulatory Visit: Payer: Medicare Other | Admitting: Pulmonary Disease

## 2013-01-30 ENCOUNTER — Ambulatory Visit (INDEPENDENT_AMBULATORY_CARE_PROVIDER_SITE_OTHER): Payer: Medicare Other | Admitting: Internal Medicine

## 2013-01-30 ENCOUNTER — Encounter: Payer: Self-pay | Admitting: Internal Medicine

## 2013-01-30 VITALS — BP 160/90 | HR 56 | Temp 97.8°F | Resp 20 | Wt 199.0 lb

## 2013-01-30 DIAGNOSIS — D649 Anemia, unspecified: Secondary | ICD-10-CM | POA: Diagnosis not present

## 2013-01-30 DIAGNOSIS — E785 Hyperlipidemia, unspecified: Secondary | ICD-10-CM

## 2013-01-30 DIAGNOSIS — I4891 Unspecified atrial fibrillation: Secondary | ICD-10-CM | POA: Diagnosis not present

## 2013-01-30 DIAGNOSIS — I10 Essential (primary) hypertension: Secondary | ICD-10-CM

## 2013-01-30 LAB — CBC WITH DIFFERENTIAL/PLATELET
Basophils Absolute: 0 10*3/uL (ref 0.0–0.1)
HCT: 35.7 % — ABNORMAL LOW (ref 39.0–52.0)
Hemoglobin: 11.9 g/dL — ABNORMAL LOW (ref 13.0–17.0)
Lymphs Abs: 1.4 10*3/uL (ref 0.7–4.0)
MCV: 91.3 fl (ref 78.0–100.0)
Monocytes Relative: 9.9 % (ref 3.0–12.0)
Neutro Abs: 3.8 10*3/uL (ref 1.4–7.7)
RDW: 16.6 % — ABNORMAL HIGH (ref 11.5–14.6)

## 2013-01-30 MED ORDER — WARFARIN SODIUM 5 MG PO TABS: ORAL_TABLET | ORAL | Status: DC

## 2013-01-30 NOTE — Patient Instructions (Signed)
Limit your sodium (Salt) intake    It is important that you exercise regularly, at least 20 minutes 3 to 4 times per week.  If you develop chest pain or shortness of breath seek  medical attention.  Return in 3 months for follow-up  

## 2013-01-30 NOTE — Progress Notes (Signed)
Subjective:    Patient ID: [name], male    DOB: 02-Jun-1927, 79 y.o.   MRN: [id number]  HPI  79 year old patient who is seen today for followup. He is followed at the [facility] system and recent laboratory screen revealed mild anemia with a hemoglobin of 11.9 g percent and hematocrit of 35.8 this apparently has been a change from prior readings. He is on Coumadin anticoagulation for atrial fibrillation. Denies any change in his bowel habits. No melena. He has treated hypertension and coronary artery disease which have been stable. He does have a history of gastroesophageal reflux disease and has been on chronic PPI therapy.  Past Medical History  Diagnosis Date  . HYPOTHYROIDISM 05/20/2007  . HYPERLIPIDEMIA 10/20/2008  . DEPRESSION 12/17/2007  . MITRAL REGURGITATION 10/20/2008  . HYPERTENSION 04/10/2007  . CORONARY ARTERY DISEASE 04/10/2007  . Atrial fibrillation 04/12/2007  . GERD 04/10/2007  . DIVERTICULOSIS, COLON 04/10/2007  . BENIGN PROSTATIC HYPERTROPHY 04/10/2007  . GYNECOMASTIA, UNILATERAL 03/23/2010  . SLEEP APNEA 05/20/2007  . WEAKNESS 11/09/2009  . CHEST PAIN 05/11/2009  . Hematoma of abdominal wall   . Clotting disorder     anticoag. therapy  . Mallory - Weiss tear     > 5 years ago  . Barrett's esophagus     History   Social History  . Marital Status: Married    Spouse Name: N/A    Number of Children: 1  . Years of Education: N/A   Occupational History  . retired    Social History Main Topics  . Smoking status: Never Smoker   . Smokeless tobacco: Never Used  . Alcohol Use: No  . Drug Use: No  . Sexually Active: Not on file   Other Topics Concern  . Not on file   Social History Narrative   He is retired from [employer] certified [employer].   Mother lived into her 24's.  Father died in his 65's.  No CAD.    Past Surgical History  Procedure Laterality Date  . Transurethral resection of prostate    . Coronary angioplasty with stent placement    . Tee with cardioversion   7/08  . Cardioversion  8/08  . Esophagogastroduodenoscopy    . Tee without cardioversion  09/14/2011    Procedure: TRANSESOPHAGEAL ECHOCARDIOGRAM (TEE);  Surgeon: Wendall Stade, MD;  Location: [facility] ENDOSCOPY;  Service: Cardiovascular;  Laterality: N/A;  . Cardioversion  09/14/2011    Procedure: CARDIOVERSION;  Surgeon: Wendall Stade, MD;  Location: [facility] ENDOSCOPY;  Service: Cardiovascular;  Laterality: N/A;    Family History  Problem Relation Age of Onset  . Hypertension Other   . Heart disease Brother     CAD male 1st degree relative    No Known Allergies  Current Outpatient Prescriptions on File Prior to Visit  Medication Sig Dispense Refill  . amLODipine (NORVASC) 10 MG tablet Take 5 mg by mouth daily as needed. Depends on BP      . Cholecalciferol (VITAMIN D-3 PO) Take 7,000 Units by mouth daily.      . clonazePAM (KLONOPIN) 1 MG tablet Take 1 mg by mouth at bedtime.        . Coenzyme Q10 (CO Q 10) 100 MG CAPS Take 1 capsule by mouth daily.        [name] DIGESTIVE ENZYMES PO Take by mouth daily.      . fish oil-omega-3 fatty acids 1000 MG capsule Take 3 g by mouth daily.      [name]  levothyroxine (SYNTHROID, LEVOTHROID) 25 MCG tablet Take 25 mcg by mouth daily.       . Multiple Vitamin (MULTIVITAMIN) capsule Take 1 capsule by mouth daily.      . nitroGLYCERIN (NITROSTAT) 0.4 MG SL tablet Place 0.4 mg under the tongue every 5 (five) minutes as needed.        . pantoprazole (PROTONIX) 40 MG tablet Take 40 mg by mouth 2 (two) times daily.        . pravastatin (PRAVACHOL) 40 MG tablet Take 40 mg by mouth every evening.      . traZODone (DESYREL) 50 MG tablet Take 150 mg by mouth at bedtime. 1/2 pill at bedtime      . amiodarone (PACERONE) 200 MG tablet Take 1 tablet (200 mg total) by mouth daily.       No current facility-administered medications on file prior to visit.    BP 160/90  Pulse 56  Temp(Src) 97.8 F (36.6 C) (Oral)  Resp 20  Wt 199 lb (90.266 kg)  BMI 29.81 kg/m2  SpO2  98%       Review of Systems  Constitutional: Negative for fever, chills, appetite change and fatigue.  HENT: Negative for hearing loss, ear pain, congestion, sore throat, trouble swallowing, neck stiffness, dental problem, voice change and tinnitus.   Eyes: Negative for pain, discharge and visual disturbance.  Respiratory: Negative for cough, chest tightness, wheezing and stridor.   Cardiovascular: Negative for chest pain, palpitations and leg swelling.  Gastrointestinal: Negative for nausea, vomiting, abdominal pain, diarrhea, constipation, blood in stool and abdominal distention.  Genitourinary: Negative for urgency, hematuria, flank pain, discharge, difficulty urinating and genital sores.  Musculoskeletal: Negative for myalgias, back pain, joint swelling, arthralgias and gait problem.  Skin: Negative for rash.  Neurological: Negative for dizziness, syncope, speech difficulty, weakness, numbness and headaches.  Hematological: Negative for adenopathy. Does not bruise/bleed easily.  Psychiatric/Behavioral: Negative for behavioral problems and dysphoric mood. The patient is not nervous/anxious.        Objective:   Physical Exam  Constitutional: He is oriented to person, place, and time. He appears well-developed.  Blood pressure 160/80  HENT:  Head: Normocephalic.  Right Ear: External ear normal.  Left Ear: External ear normal.  Eyes: Conjunctivae and EOM are normal.  Neck: Normal range of motion.  Cardiovascular: Normal rate, regular rhythm and normal heart sounds.   Pulmonary/Chest: Breath sounds normal.  Abdominal: Bowel sounds are normal.  Musculoskeletal: Normal range of motion. He exhibits no edema and no tenderness.  Neurological: He is alert and oriented to person, place, and time.  Psychiatric: He has a normal mood and affect. His behavior is normal.          Assessment & Plan:   Mild anemia. We'll recheck a CBC. We'll also check stool for cold blood  x4 Hypertension. Mildly elevated systolic readings today Willa follow. No change in medication chronic atrial fibrillation. Remains in normal sinus rhythm  Recheck 3 months

## 2013-02-03 ENCOUNTER — Encounter: Payer: Self-pay | Admitting: Pulmonary Disease

## 2013-02-03 ENCOUNTER — Ambulatory Visit (INDEPENDENT_AMBULATORY_CARE_PROVIDER_SITE_OTHER): Payer: Medicare Other | Admitting: Pulmonary Disease

## 2013-02-03 VITALS — BP 130/80 | HR 57 | Temp 97.7°F | Ht 71.0 in | Wt 198.6 lb

## 2013-02-03 DIAGNOSIS — G4700 Insomnia, unspecified: Secondary | ICD-10-CM | POA: Diagnosis not present

## 2013-02-03 DIAGNOSIS — G4733 Obstructive sleep apnea (adult) (pediatric): Secondary | ICD-10-CM

## 2013-02-03 DIAGNOSIS — IMO0002 Reserved for concepts with insufficient information to code with codable children: Secondary | ICD-10-CM

## 2013-02-03 DIAGNOSIS — G4750 Parasomnia, unspecified: Secondary | ICD-10-CM

## 2013-02-03 NOTE — Assessment & Plan Note (Signed)
He is to continue trazodone nightly.

## 2013-02-03 NOTE — Progress Notes (Signed)
Chief Complaint  Patient presents with  . Follow-up    pt is using Bipap every evening for approx 8 hours a night. Pt states sometimes air comes out of sides of his mask.     History of Present Illness: [name] is a 79 y.o. male with NREM parasomnia and OSA.  He is doing well with BiPAP.  He has full face mask.  His mask sometimes leaks at night when he rolls over.  He is sleeping well and feels rested.  He uses klonopin and trazodone at night.  TESTS: PSG 12/08/05 >> AHI 0.8, SpO2 low 85%, UARS, Central events with sleep onset PSG 09/26/06 >> AHI 0.8, SpO2 low 89%, UARS, Central events with sleep onset ASV titration 01/07/07 PSG 11/14/12 >> AHI 3, SpO2 low 89%, PLMI 0.  Reduced sleep time, no R or S sleep PSG 12/12/12 >> AHI 8.2, SpO2 low 88%, Supine AHI 25.4. BiPAP 11/13/12 to 11/21/12 >> Used on 9 of 9 nights with average 7 hrs 6 min.  Median IPAP 12 cm H2O, 95 th percentile IPAP 16 cm H2O.  Median EPAP 7 cm H2O, 95 th percentile EPAP 7 cm H2O.  [name]  has a past medical history of HYPOTHYROIDISM (05/20/2007); HYPERLIPIDEMIA (10/20/2008); DEPRESSION (12/17/2007); MITRAL REGURGITATION (10/20/2008); HYPERTENSION (04/10/2007); CORONARY ARTERY DISEASE (04/10/2007); Atrial fibrillation (04/12/2007); GERD (04/10/2007); DIVERTICULOSIS, COLON (04/10/2007); BENIGN PROSTATIC HYPERTROPHY (04/10/2007); GYNECOMASTIA, UNILATERAL (03/23/2010); SLEEP APNEA (05/20/2007); WEAKNESS (11/09/2009); CHEST PAIN (05/11/2009); Hematoma of abdominal wall; Clotting disorder; Mallory - Weiss tear; and Barrett's esophagus.  [name]  has past surgical history that includes Transurethral resection of prostate; Coronary angioplasty with stent; TEE with cardioversion (7/08); cardioversion (8/08); Esophagogastroduodenoscopy; TEE without cardioversion (09/14/2011); and Cardioversion (09/14/2011).  Prior to Admission medications   Medication Sig Start Date End Date Taking? Authorizing Provider  amiodarone (PACERONE) 200 MG tablet  Take 1 tablet (200 mg total) by mouth daily. 11/03/11 11/04/12 Yes Rollene Rotunda, MD  amLODipine (NORVASC) 10 MG tablet Take 5 mg by mouth daily as needed. Depends on BP   Yes Historical Provider, MD  Cholecalciferol (VITAMIN D-3 PO) Take 7,000 Units by mouth daily.   Yes Historical Provider, MD  clonazePAM (KLONOPIN) 1 MG tablet Take 1 mg by mouth at bedtime.     Yes Historical Provider, MD  Coenzyme Q10 (CO Q 10) 100 MG CAPS Take 1 capsule by mouth daily.     Yes Historical Provider, MD  DIGESTIVE ENZYMES PO Take by mouth daily.   Yes Historical Provider, MD  fish oil-omega-3 fatty acids 1000 MG capsule Take 3 g by mouth daily.   Yes Historical Provider, MD  levothyroxine (SYNTHROID, LEVOTHROID) 25 MCG tablet Take 25 mcg by mouth daily.    Yes Historical Provider, MD  Multiple Vitamin (MULTIVITAMIN) capsule Take 1 capsule by mouth daily.   Yes Historical Provider, MD  nitroGLYCERIN (NITROSTAT) 0.4 MG SL tablet Place 0.4 mg under the tongue every 5 (five) minutes as needed.     Yes Historical Provider, MD  pantoprazole (PROTONIX) 40 MG tablet Take 40 mg by mouth 2 (two) times daily.     Yes Historical Provider, MD  pravastatin (PRAVACHOL) 40 MG tablet Take 40 mg by mouth every evening. 03/07/11  Yes Dyann Kief, PA-C  Prenatal Vit-Fe Fumarate-FA (M-VIT) tablet Take 1 tablet by mouth daily.     Yes Historical Provider, MD  traZODone (DESYREL) 50 MG tablet Take 150 mg by mouth at bedtime. 1/2 pill at bedtime   Yes  Historical Provider, MD  warfarin (COUMADIN) 5 MG tablet Take per coumadin clinic instructions, dose changes according to INR, this is a 90 supply 10/09/12  Yes Rollene Rotunda, MD    No Known Allergies   Physical Exam:  General - No distress ENT - No sinus tenderness, no oral exudate, no LAN Cardiac - s1s2 regular, no murmur Chest - No wheeze/rales/dullness Back - No focal tenderness Abd - Soft, non-tender Ext - No edema Neuro - Normal strength Skin - No rashes Psych - normal  mood, and behavior   Assessment/Plan:  Coralyn Helling, MD [place] [facility]/Critical Care/Sleep Pager:  [phone]

## 2013-02-03 NOTE — Assessment & Plan Note (Signed)
He has a NREM parasomnia controlled on [facility] 

## 2013-02-03 NOTE — Patient Instructions (Signed)
Follow up in 1 year.

## 2013-02-03 NOTE — Assessment & Plan Note (Signed)
He is compliant with therapy and reports benefit from BiPAP.

## 2013-02-04 ENCOUNTER — Other Ambulatory Visit (INDEPENDENT_AMBULATORY_CARE_PROVIDER_SITE_OTHER): Payer: Medicare Other

## 2013-02-04 DIAGNOSIS — D649 Anemia, unspecified: Secondary | ICD-10-CM | POA: Diagnosis not present

## 2013-02-04 LAB — HEMOCCULT GUIAC POC 1CARD (OFFICE)

## 2013-02-20 ENCOUNTER — Ambulatory Visit (INDEPENDENT_AMBULATORY_CARE_PROVIDER_SITE_OTHER): Payer: Medicare Other | Admitting: *Deleted

## 2013-02-20 DIAGNOSIS — I4891 Unspecified atrial fibrillation: Secondary | ICD-10-CM | POA: Diagnosis not present

## 2013-02-20 DIAGNOSIS — Z7901 Long term (current) use of anticoagulants: Secondary | ICD-10-CM | POA: Diagnosis not present

## 2013-02-20 DIAGNOSIS — L570 Actinic keratosis: Secondary | ICD-10-CM | POA: Diagnosis not present

## 2013-02-20 LAB — POCT INR: INR: 2.9

## 2013-03-12 ENCOUNTER — Telehealth: Payer: Self-pay | Admitting: Cardiology

## 2013-03-12 NOTE — Telephone Encounter (Signed)
New problem   Pt is concern about his bp being 90/50. Please call pt.

## 2013-03-12 NOTE — Telephone Encounter (Signed)
Patients wife called concerned about husband feeling tired for the past week and low BP this morning. She states that it does fluctuate, but that this is  the lowest she has seen. Patient is currently in taking plenty of fluids, no new medications, no recent illness, no loss of appetite, no other symptoms associated with complaint. I advised her to keep checking it several times a day and if continues to remain low, drops lower, or patient becomes symptomatic to call us back. Will forward to Avie Arenas RN [facility] nurse) to make aware.

## 2013-03-14 ENCOUNTER — Telehealth: Payer: Self-pay | Admitting: Pulmonary Disease

## 2013-03-14 NOTE — Telephone Encounter (Signed)
Pt is aware of download results. 

## 2013-03-14 NOTE — Telephone Encounter (Signed)
CPAP 01/02/13 to 03/02/13 >> Used on 59 of 60 nights with average 6 hrs 47 min.  Average AHI 0.8 with median PAP 14 cm H2O and 95 th percentile PAP 15 cm H2O.  Will have my nurse inform pt that CPAP report looks good.  No change to current set up needed

## 2013-03-19 ENCOUNTER — Ambulatory Visit (INDEPENDENT_AMBULATORY_CARE_PROVIDER_SITE_OTHER): Payer: Medicare Other | Admitting: *Deleted

## 2013-03-19 DIAGNOSIS — Z7901 Long term (current) use of anticoagulants: Secondary | ICD-10-CM | POA: Diagnosis not present

## 2013-03-19 DIAGNOSIS — I4891 Unspecified atrial fibrillation: Secondary | ICD-10-CM

## 2013-03-19 LAB — POCT INR: INR: 3.8

## 2013-04-02 ENCOUNTER — Ambulatory Visit (INDEPENDENT_AMBULATORY_CARE_PROVIDER_SITE_OTHER): Payer: Medicare Other | Admitting: Pharmacist

## 2013-04-02 DIAGNOSIS — Z7901 Long term (current) use of anticoagulants: Secondary | ICD-10-CM | POA: Diagnosis not present

## 2013-04-02 DIAGNOSIS — I4891 Unspecified atrial fibrillation: Secondary | ICD-10-CM | POA: Diagnosis not present

## 2013-04-29 ENCOUNTER — Ambulatory Visit (INDEPENDENT_AMBULATORY_CARE_PROVIDER_SITE_OTHER): Payer: Medicare Other | Admitting: *Deleted

## 2013-04-29 ENCOUNTER — Encounter: Payer: Self-pay | Admitting: Cardiology

## 2013-04-29 ENCOUNTER — Ambulatory Visit (INDEPENDENT_AMBULATORY_CARE_PROVIDER_SITE_OTHER): Payer: Medicare Other | Admitting: Cardiology

## 2013-04-29 VITALS — BP 144/77 | HR 59 | Ht 71.0 in | Wt 185.0 lb

## 2013-04-29 DIAGNOSIS — I4891 Unspecified atrial fibrillation: Secondary | ICD-10-CM

## 2013-04-29 DIAGNOSIS — I10 Essential (primary) hypertension: Secondary | ICD-10-CM

## 2013-04-29 DIAGNOSIS — I2510 Atherosclerotic heart disease of native coronary artery without angina pectoris: Secondary | ICD-10-CM

## 2013-04-29 DIAGNOSIS — I080 Rheumatic disorders of both mitral and aortic valves: Secondary | ICD-10-CM | POA: Diagnosis not present

## 2013-04-29 DIAGNOSIS — Z7901 Long term (current) use of anticoagulants: Secondary | ICD-10-CM

## 2013-04-29 NOTE — Progress Notes (Signed)
HPI The patient presents for his usual follow up.  Since I last saw him he has had no new problems.  He actually feels better. He had his thyroid adjusted recently. He's actually walking a mile most days. He has some chronic dyspnea and fatigue. He's not having any new chest pain, PND or orthopnea. He's not having any new palpitations, presyncope or syncope.  No Known Allergies  Current Outpatient Prescriptions  Medication Sig Dispense Refill  . amiodarone (PACERONE) 200 MG tablet Take 1 tablet (200 mg total) by mouth daily.      [name] amLODipine (NORVASC) 10 MG tablet Take 5 mg by mouth daily as needed. Depends on BP      . Cholecalciferol (VITAMIN D-3 PO) Take 7,000 Units by mouth daily.      . clonazePAM (KLONOPIN) 1 MG tablet Take 1 mg by mouth at bedtime.        . Coenzyme Q10 (CO Q 10) 100 MG CAPS Take 1 capsule by mouth daily.        [name] DIGESTIVE ENZYMES PO Take by mouth daily.      . fish oil-omega-3 fatty acids 1000 MG capsule Take 3 g by mouth daily.      [name] levothyroxine (SYNTHROID, LEVOTHROID) 25 MCG tablet Take 25 mcg by mouth daily.       . Multiple Vitamin (MULTIVITAMIN) capsule Take 1 capsule by mouth daily.      . nitroGLYCERIN (NITROSTAT) 0.4 MG SL tablet Place 0.4 mg under the tongue every 5 (five) minutes as needed.        . pantoprazole (PROTONIX) 40 MG tablet Take 40 mg by mouth 2 (two) times daily.        . pravastatin (PRAVACHOL) 40 MG tablet Take 40 mg by mouth every evening.      . traZODone (DESYREL) 50 MG tablet Take 150 mg by mouth at bedtime. 1/2 pill at bedtime      . warfarin (COUMADIN) 5 MG tablet Take per coumadin clinic instructions, dose changes according to INR, this is a 90 supply  90 tablet  1   No current facility-administered medications for this visit.    Past Medical History  Diagnosis Date  . HYPOTHYROIDISM 05/20/2007  . HYPERLIPIDEMIA 10/20/2008  . DEPRESSION 12/17/2007  . MITRAL REGURGITATION 10/20/2008  . HYPERTENSION 04/10/2007  . CORONARY ARTERY  DISEASE 04/10/2007  . Atrial fibrillation 04/12/2007  . GERD 04/10/2007  . DIVERTICULOSIS, COLON 04/10/2007  . BENIGN PROSTATIC HYPERTROPHY 04/10/2007  . GYNECOMASTIA, UNILATERAL 03/23/2010  . SLEEP APNEA 05/20/2007  . WEAKNESS 11/09/2009  . CHEST PAIN 05/11/2009  . Hematoma of abdominal wall   . Clotting disorder     anticoag. therapy  . Mallory - Weiss tear     > 5 years ago  . Barrett's esophagus     Past Surgical History  Procedure Laterality Date  . Transurethral resection of prostate    . Coronary angioplasty with stent placement    . Tee with cardioversion  7/08  . Cardioversion  8/08  . Esophagogastroduodenoscopy    . Tee without cardioversion  09/14/2011    Procedure: TRANSESOPHAGEAL ECHOCARDIOGRAM (TEE);  Surgeon: Wendall Stade, MD;  Location: [facility] ENDOSCOPY;  Service: Cardiovascular;  Laterality: N/A;  . Cardioversion  09/14/2011    Procedure: CARDIOVERSION;  Surgeon: Wendall Stade, MD;  Location: [facility] ENDOSCOPY;  Service: Cardiovascular;  Laterality: N/A;    ROS:  As stated in the HPI and negative for all other systems.  PHYSICAL  EXAM BP 144/77  Pulse 59  Ht 5\' 11"  (1.803 m)  Wt 185 lb (83.915 kg)  BMI 25.81 kg/m2 GENERAL: No acute distress HEENT:  Pupils equal round and reactive, fundi not visualized, oral mucosa unremarkable NECK:  No jugular venous distention, waveform within normal limits, carotid upstroke brisk and symmetric, no bruits, no thyromegaly LYMPHATICS:  No cervical, inguinal adenopathy LUNGS:  Clear to auscultation bilaterally BACK:  No CVA tenderness CHEST:  Unremarkable HEART:  PMI not displaced or sustained,S1 and S2 within normal limits, no S3, no S4, no clicks, no rubs, apical and axillary late systolic murmur ABD:  Flat, positive bowel sounds normal in frequency in pitch, no bruits, no rebound, no guarding, no midline pulsatile mass, no hepatomegaly, no splenomegaly EXT:  2 plus pulses throughout, no edema, no cyanosis no clubbing PSYCH:  Continued  flat affect but not tearful today.   EKG:  Sinus bradycardia, rate 88, axis within normal limits, intervals within normal limits, no acute ST-T wave changes.  04/29/2013   ASSESSMENT AND PLAN   CORONARY ARTERY DISEASE -  The patient has no new sypmtoms.  No further cardiovascular testing is indicated.  We will continue with aggressive risk reduction and meds as listed.  HYPERTENSION -  The blood pressure is at target. No change in medications is indicated. We will continue with therapeutic lifestyle changes (TLC).  MITRAL REGURGITATION -  He has had only mild mitral regurgitation and a well preserved ejection fraction. There is no change in the exam.  No further imaging is needed at this time.   ATRIAL FIBRILLATION - He has had no recurrent dysrhythmias. I will continue with amiodarone and anticoagulation.  Of note he understands the need to have his liver enzymes and thyroid followed  And he is doing this routinely at the [place]  FATIGUE - He  Seems to be improved. This may have been related to his thyroid.

## 2013-04-29 NOTE — Patient Instructions (Signed)
The current medical regimen is effective;  continue present plan and medications.  Follow up in 6 months with Dr Hochrein.  You will receive a letter in the mail 2 months before you are due.  Please call us when you receive this letter to schedule your follow up appointment.  

## 2013-05-06 ENCOUNTER — Encounter: Payer: Self-pay | Admitting: Pulmonary Disease

## 2013-05-13 ENCOUNTER — Ambulatory Visit (INDEPENDENT_AMBULATORY_CARE_PROVIDER_SITE_OTHER): Payer: Medicare Other | Admitting: *Deleted

## 2013-05-13 DIAGNOSIS — Z7901 Long term (current) use of anticoagulants: Secondary | ICD-10-CM | POA: Diagnosis not present

## 2013-05-13 DIAGNOSIS — I4891 Unspecified atrial fibrillation: Secondary | ICD-10-CM

## 2013-05-13 MED ORDER — WARFARIN SODIUM 5 MG PO TABS: ORAL_TABLET | ORAL | Status: DC

## 2013-05-22 ENCOUNTER — Other Ambulatory Visit: Payer: Self-pay

## 2013-05-27 DIAGNOSIS — K219 Gastro-esophageal reflux disease without esophagitis: Secondary | ICD-10-CM | POA: Diagnosis not present

## 2013-05-27 DIAGNOSIS — R141 Gas pain: Secondary | ICD-10-CM | POA: Diagnosis not present

## 2013-05-28 ENCOUNTER — Ambulatory Visit (INDEPENDENT_AMBULATORY_CARE_PROVIDER_SITE_OTHER): Payer: Medicare Other | Admitting: *Deleted

## 2013-05-28 DIAGNOSIS — I4891 Unspecified atrial fibrillation: Secondary | ICD-10-CM

## 2013-05-28 DIAGNOSIS — Z7901 Long term (current) use of anticoagulants: Secondary | ICD-10-CM

## 2013-06-09 ENCOUNTER — Emergency Department (HOSPITAL_COMMUNITY): Payer: Medicare Other

## 2013-06-09 ENCOUNTER — Encounter (HOSPITAL_COMMUNITY): Payer: Self-pay | Admitting: Emergency Medicine

## 2013-06-09 ENCOUNTER — Ambulatory Visit (INDEPENDENT_AMBULATORY_CARE_PROVIDER_SITE_OTHER): Payer: Medicare Other | Admitting: Nurse Practitioner

## 2013-06-09 ENCOUNTER — Encounter: Payer: Self-pay | Admitting: Cardiology

## 2013-06-09 ENCOUNTER — Observation Stay (HOSPITAL_COMMUNITY)
Admission: EM | Admit: 2013-06-09 | Discharge: 2013-06-10 | Disposition: A | Discharge status: Home / Self Care | Payer: Medicare Other | Attending: Cardiology | Admitting: Cardiology

## 2013-06-09 VITALS — BP 140/78 | HR 112 | Resp 16

## 2013-06-09 DIAGNOSIS — I4891 Unspecified atrial fibrillation: Principal | ICD-10-CM

## 2013-06-09 DIAGNOSIS — R002 Palpitations: Secondary | ICD-10-CM | POA: Diagnosis not present

## 2013-06-09 DIAGNOSIS — N62 Hypertrophy of breast: Secondary | ICD-10-CM | POA: Diagnosis not present

## 2013-06-09 DIAGNOSIS — Z9861 Coronary angioplasty status: Secondary | ICD-10-CM | POA: Insufficient documentation

## 2013-06-09 DIAGNOSIS — K2270 Barrett's esophagus without dysplasia: Secondary | ICD-10-CM | POA: Diagnosis not present

## 2013-06-09 DIAGNOSIS — Z86718 Personal history of other venous thrombosis and embolism: Secondary | ICD-10-CM | POA: Insufficient documentation

## 2013-06-09 DIAGNOSIS — R5381 Other malaise: Secondary | ICD-10-CM | POA: Diagnosis not present

## 2013-06-09 DIAGNOSIS — I739 Peripheral vascular disease, unspecified: Secondary | ICD-10-CM | POA: Insufficient documentation

## 2013-06-09 DIAGNOSIS — F329 Major depressive disorder, single episode, unspecified: Secondary | ICD-10-CM | POA: Diagnosis not present

## 2013-06-09 DIAGNOSIS — R0609 Other forms of dyspnea: Secondary | ICD-10-CM | POA: Diagnosis not present

## 2013-06-09 DIAGNOSIS — Z7901 Long term (current) use of anticoagulants: Secondary | ICD-10-CM | POA: Insufficient documentation

## 2013-06-09 DIAGNOSIS — I2510 Atherosclerotic heart disease of native coronary artery without angina pectoris: Secondary | ICD-10-CM | POA: Diagnosis not present

## 2013-06-09 DIAGNOSIS — R0989 Other specified symptoms and signs involving the circulatory and respiratory systems: Secondary | ICD-10-CM | POA: Insufficient documentation

## 2013-06-09 DIAGNOSIS — E039 Hypothyroidism, unspecified: Secondary | ICD-10-CM | POA: Diagnosis not present

## 2013-06-09 DIAGNOSIS — F3289 Other specified depressive episodes: Secondary | ICD-10-CM | POA: Insufficient documentation

## 2013-06-09 DIAGNOSIS — K219 Gastro-esophageal reflux disease without esophagitis: Secondary | ICD-10-CM | POA: Diagnosis not present

## 2013-06-09 DIAGNOSIS — I10 Essential (primary) hypertension: Secondary | ICD-10-CM | POA: Diagnosis not present

## 2013-06-09 DIAGNOSIS — N400 Enlarged prostate without lower urinary tract symptoms: Secondary | ICD-10-CM | POA: Insufficient documentation

## 2013-06-09 DIAGNOSIS — I080 Rheumatic disorders of both mitral and aortic valves: Secondary | ICD-10-CM | POA: Insufficient documentation

## 2013-06-09 DIAGNOSIS — E785 Hyperlipidemia, unspecified: Secondary | ICD-10-CM | POA: Insufficient documentation

## 2013-06-09 DIAGNOSIS — I480 Paroxysmal atrial fibrillation: Secondary | ICD-10-CM | POA: Diagnosis present

## 2013-06-09 DIAGNOSIS — G4733 Obstructive sleep apnea (adult) (pediatric): Secondary | ICD-10-CM | POA: Insufficient documentation

## 2013-06-09 DIAGNOSIS — Z79899 Other long term (current) drug therapy: Secondary | ICD-10-CM | POA: Insufficient documentation

## 2013-06-09 HISTORY — DX: Paroxysmal atrial fibrillation: I48.0

## 2013-06-09 HISTORY — DX: Personal history of other medical treatment: Z92.89

## 2013-06-09 HISTORY — DX: Embolism and thrombosis of arteries of the lower extremities: I74.3

## 2013-06-09 LAB — CBC
HCT: 36.8 % — ABNORMAL LOW (ref 39.0–52.0)
Hemoglobin: 12 g/dL — ABNORMAL LOW (ref 13.0–17.0)
MCH: 29.4 pg (ref 26.0–34.0)
MCV: 90.2 fL (ref 78.0–100.0)
Platelets: 191 10*3/uL (ref 150–400)
RBC: 4.08 MIL/uL — ABNORMAL LOW (ref 4.22–5.81)
RDW: 15.7 % — ABNORMAL HIGH (ref 11.5–15.5)
WBC: 7.5 10*3/uL (ref 4.0–10.5)

## 2013-06-09 LAB — BASIC METABOLIC PANEL
BUN: 23 mg/dL (ref 6–23)
CO2: 23 mEq/L (ref 19–32)
Calcium: 9.2 mg/dL (ref 8.4–10.5)
Chloride: 102 mEq/L (ref 96–112)
Creatinine, Ser: 1.36 mg/dL — ABNORMAL HIGH (ref 0.50–1.35)
Potassium: 4.3 mEq/L (ref 3.5–5.1)
Sodium: 138 mEq/L (ref 135–145)

## 2013-06-09 LAB — PRO B NATRIURETIC PEPTIDE: Pro B Natriuretic peptide (BNP): 856.7 pg/mL — ABNORMAL HIGH (ref 0–450)

## 2013-06-09 LAB — POCT I-STAT TROPONIN I

## 2013-06-09 LAB — PROTIME-INR
INR: 2.28 — ABNORMAL HIGH (ref 0.00–1.49)
Prothrombin Time: 24.4 seconds — ABNORMAL HIGH (ref 11.6–15.2)

## 2013-06-09 MED ORDER — WARFARIN - PHARMACIST DOSING INPATIENT: Freq: Every day | Status: DC

## 2013-06-09 MED ORDER — SODIUM CHLORIDE 0.9 % IJ SOLN: 3.0000 mL | INTRAMUSCULAR | Status: DC | PRN

## 2013-06-09 MED ORDER — SODIUM CHLORIDE 0.9 % IV SOLN: 250.0000 mL | INTRAVENOUS | Status: DC | PRN

## 2013-06-09 MED ORDER — ZOLPIDEM TARTRATE 5 MG PO TABS: 5.0000 mg | ORAL_TABLET | Freq: Every evening | ORAL | Status: DC | PRN

## 2013-06-09 MED ORDER — WARFARIN SODIUM 5 MG PO TABS
5.0000 mg | ORAL_TABLET | Freq: Once | ORAL | Status: AC
  Administered 2013-06-09: 5 mg via ORAL
  Filled 2013-06-09: qty 1

## 2013-06-09 MED ORDER — ONDANSETRON HCL 4 MG/2ML IJ SOLN: 4.0000 mg | Freq: Four times a day (QID) | INTRAMUSCULAR | Status: DC | PRN

## 2013-06-09 MED ORDER — ACETAMINOPHEN 325 MG PO TABS: 650.0000 mg | ORAL_TABLET | ORAL | Status: DC | PRN

## 2013-06-09 MED ORDER — SODIUM CHLORIDE 0.9 % IJ SOLN
3.0000 mL | Freq: Two times a day (BID) | INTRAMUSCULAR | Status: DC
  Administered 2013-06-09 – 2013-06-10 (×2): 3 mL via INTRAVENOUS

## 2013-06-09 NOTE — Progress Notes (Signed)
Patient presents as walk-in to report his recent BP readings and c/o feeling like he is in atrial fib.  Patient ambulates without difficulty in no acute distress, skin warm, dry and acyanotic, patient alert and oriented to person, place,time.  Patient's recent BP readings are as follows: Sat. 11/22 109/64, HR 115 130/78, 105 125/93, 116  Sun 11/23 102/66,  HR 107 161/108, 125 142/81, 115  Mon 11/24 123/90, 115 123/94, 100  Patient c/o severe weakness, lethargy; denies chest pain or SOB.  12 lead ekg performed and taken to Dr. Antoine Poche, patient's primary cardiologist for review.  Dr. Antoine Poche advised patient go to the ER for evaluation and possible cardioversion.  Patient verbalized agreement and understanding and states he is going to [facility] ER now.  Patient discharged in no acute distress.  Trish, hospital liason notified.

## 2013-06-09 NOTE — ED Provider Notes (Signed)
CSN: [id number]     Arrival date & time 06/09/13  1222 History   First MD Initiated Contact with Patient 06/09/13 1240     Chief Complaint  Patient presents with  . Palpitations   (Consider location/radiation/quality/duration/timing/severity/associated sxs/prior Treatment) HPI Comments: 79 year old male with history of atrial fibrillation comes in today with complaints of palpitations. Patient reports when he gets tachycardic he feels very weak and rundown. He states he's been feeling this for several days. He is not necessarily feel the palpitations a presentation similar to all previous atrial fibrillation presentations. Patient reports being weak for several days worsening. He's been taking his medications have been compliant with no issues.  Patient is a 80 y.o. male presenting with palpitations.  Palpitations Palpitations quality:  Irregular Onset quality:  Unable to specify Timing:  Intermittent Progression:  Waxing and waning Chronicity:  Recurrent Context comment:  Previously known afib Relieved by:  Nothing Worsened by:  Nothing tried Ineffective treatments: normal meds including amiodarone and coumadin. Associated symptoms: no shortness of breath     Past Medical History  Diagnosis Date  . HYPOTHYROIDISM 05/20/2007  . HYPERLIPIDEMIA 10/20/2008  . DEPRESSION 12/17/2007  . MITRAL REGURGITATION 10/20/2008  . HYPERTENSION 04/10/2007  . CORONARY ARTERY DISEASE 04/10/2007    a. s/p stent OM 2002. b. s/p BMS 2006 RCA  c. s/p cath 2007.  [name] Atrial fibrillation 04/12/2007  . GERD 04/10/2007  . DIVERTICULOSIS, COLON 04/10/2007  . BENIGN PROSTATIC HYPERTROPHY 04/10/2007  . GYNECOMASTIA, UNILATERAL 03/23/2010  . SLEEP APNEA 05/20/2007  . WEAKNESS 11/09/2009  . Hematoma of abdominal wall   . Clotting disorder     anticoag. therapy  . Mallory - Weiss tear     > 5 years ago  . Barrett's esophagus   . Popliteal artery embolism, right   . History of echocardiogram     a. EF 55% (TEE 08/2011)    Past Surgical History  Procedure Laterality Date  . Transurethral resection of prostate    . Coronary angioplasty with stent placement    . Tee with cardioversion  7/08  . Cardioversion  8/08  . Esophagogastroduodenoscopy    . Tee without cardioversion  09/14/2011    Procedure: TRANSESOPHAGEAL ECHOCARDIOGRAM (TEE);  Surgeon: Wendall Stade, MD;  Location: [facility] ENDOSCOPY;  Service: Cardiovascular;  Laterality: N/A;  . Cardioversion  09/14/2011    Procedure: CARDIOVERSION;  Surgeon: Wendall Stade, MD;  Location: [facility] ENDOSCOPY;  Service: Cardiovascular;  Laterality: N/A;   Family History  Problem Relation Age of Onset  . Hypertension Other   . Heart disease Brother     CAD male 1st degree relative   History  Substance Use Topics  . Smoking status: Never Smoker   . Smokeless tobacco: Never Used  . Alcohol Use: No    Review of Systems  Constitutional: Positive for fatigue.  Respiratory: Negative for shortness of breath.   Cardiovascular: Positive for palpitations.  Gastrointestinal: Negative for abdominal pain.  Genitourinary: Negative for difficulty urinating.  Skin: Negative for rash.  Neurological: Negative for syncope and headaches.  Psychiatric/Behavioral: Negative for agitation.  All other systems reviewed and are negative.    Allergies  Review of patient's allergies indicates no known allergies.  Home Medications   Current Outpatient Rx  Name  Route  Sig  Dispense  Refill  . acetaminophen (TYLENOL) 325 MG tablet   Oral   Take 650 mg by mouth every 6 (six) hours as needed for headache.         [name]  amiodarone (PACERONE) 200 MG tablet   Oral   Take 1 tablet (200 mg total) by mouth daily.         [name] amLODipine (NORVASC) 10 MG tablet   Oral   Take 5 mg by mouth daily as needed. Depends on BP         . Cholecalciferol (VITAMIN D-3 PO)   Oral   Take 7,000 Units by mouth daily.         . clonazePAM (KLONOPIN) 1 MG tablet   Oral   Take 1 mg by mouth at  bedtime.           . Coenzyme Q10 (CO Q 10) 100 MG CAPS   Oral   Take 1 capsule by mouth daily.           [name] DIGESTIVE ENZYMES PO   Oral   Take 1 tablet by mouth every evening.          . fish oil-omega-3 fatty acids 1000 MG capsule   Oral   Take 3 g by mouth daily.         [name] levothyroxine (SYNTHROID, LEVOTHROID) 25 MCG tablet   Oral   Take 25 mcg by mouth daily.          . Multiple Vitamin (MULTIVITAMIN) capsule   Oral   Take 1 capsule by mouth daily.         . nitroGLYCERIN (NITROSTAT) 0.4 MG SL tablet   Sublingual   Place 0.4 mg under the tongue every 5 (five) minutes as needed for chest pain.          . pantoprazole (PROTONIX) 40 MG tablet   Oral   Take 40 mg by mouth 2 (two) times daily.           . pravastatin (PRAVACHOL) 40 MG tablet   Oral   Take 40 mg by mouth every evening.         [name] PRESCRIPTION MEDICATION   Both Eyes   Place 1 drop into both eyes daily as needed (dry eyes).         . traZODone (DESYREL) 50 MG tablet   Oral   Take 150 mg by mouth at bedtime. 1/2 pill at bedtime         . warfarin (COUMADIN) 5 MG tablet   Oral   Take 2.5-5 mg by mouth every evening. Take a whole tablet (5mg ) on Monday Wednesday and Friday.  Take a half of a tablet (2.5mg ) on Tuesday, Thursday, Saturday and Sunday.          BP 153/117  Pulse 125  Temp(Src) 97.8 F (36.6 C) (Oral)  Resp 28  Wt 185 lb (83.915 kg)  SpO2 100% Physical Exam  Nursing note and vitals reviewed. Constitutional: He is oriented to person, place, and time. He appears well-developed and well-nourished.  HENT:  Head: Normocephalic and atraumatic.  Eyes: EOM are normal. Pupils are equal, round, and reactive to light.  Neck: Normal range of motion.  Cardiovascular: Normal rate and intact distal pulses.   Atrial fibrillation  Pulmonary/Chest: Effort normal and breath sounds normal. No respiratory distress.  Abdominal: Soft. He exhibits no distension. There is no  tenderness.  Musculoskeletal: Normal range of motion. He exhibits no edema.  Neurological: He is alert and oriented to person, place, and time. No cranial nerve deficit. He exhibits normal muscle tone. Coordination normal.  Skin: Skin is warm and dry. No rash noted.  Psychiatric: He has a normal  mood and affect. His behavior is normal. Judgment and thought content normal.    ED Course  Procedures (including critical care time) Labs Review Labs Reviewed  CBC - Abnormal; Notable for the following:    RBC 4.08 (*)    Hemoglobin 12.0 (*)    HCT 36.8 (*)    RDW 15.7 (*)    All other components within normal limits  BASIC METABOLIC PANEL - Abnormal; Notable for the following:    Creatinine, Ser 1.36 (*)    GFR calc non Af Amer 46 (*)    GFR calc Af Amer 53 (*)    All other components within normal limits  PRO B NATRIURETIC PEPTIDE - Abnormal; Notable for the following:    Pro B Natriuretic peptide (BNP) 856.7 (*)    All other components within normal limits  POCT I-STAT TROPONIN I   Imaging Review Dg Chest 2 View  06/09/2013   CLINICAL DATA:  PALPITATIONS.  EXAM: CHEST  2 VIEW  COMPARISON:  09/13/2011  FINDINGS: The lungs are clear without focal infiltrate, edema, or effusion. Lungs are hyperexpanded. The cardio pericardial silhouette is enlarged. Bones are diffusely demineralized. [name]  IMPRESSION: Cardiomegaly without acute cardiopulmonary process.  .   Electronically Signed   By: Kennith Center M.D.   On: 06/09/2013 13:13    EKG Interpretation   None       MDM   1. Atrial fibrillation    79 year old male previous atrial fibrillation comes in with complaints of fatigue consistent with previous atrial fibrillation and tachycardic episodes. On arrival patient noted to be in atrial fibrillation with a rate greater than 100. Patient is having active chest pain no shortness of breath does not appear symptomatic at this time. Patient has been seen by his cardiologist morning recommended  he come to the department for evaluation. EKG atrial fibrillation and tachycardia. No concerning findings such as ST elevations depressions or T-wave inversions initial troponin negative. BNP slightly elevated. Chest x-ray showed cardiomegaly however no signs of pneumonia other pulmonary disease. Secondary to atrial fibrillation with tachycardia patient was counseled to the cardiology service was admitted the patient. Remains stable in the emergency department for further acute issues until transfer.  Patient discussed with attending Dr. Jeraldine Loots.     Bridgett Larsson, MD 06/09/13 [phone]

## 2013-06-09 NOTE — H&P (Signed)
CARDIOLOGY CONSULT NOTE   Patient ID: [name] MRN: [id number] DOB/AGE: September 27, 1926 79 y.o.  Admit date: 06/09/2013  Primary Physician   Rogelia Boga, MD Primary Cardiologist   Dr. Antoine Poche  Reason for Consultation   AFIB with RVR  HPI: [name] is a 79 y.o. male with a history of CAD s/p stent OM 2002, BMS to RCA 2006 & cath 2007 with residual disease, sleep apnea on CPAP, HTN, HLD, GERD with Barrett's esophagus, hypothyroidism, moderate MR, paroxysmal afib on amiodarone and coumadin who presents to the emergency department in afib with RVR. Patient has a long history of afib with both chemical and pharmacologic cardioversion. He is currently on amiodarone and his last electrical CV was in 08/2011. He has been in NSR on amioderone until approx 4 days ago.  He doesn't feel palpitations but knows he is out of rhythm when he starts to feel weak, lethargic, DOE and mild SOB. He has not been able to take his usual walks and does not feel well enough to do his normal daily activities. He also admits to 10 minutes of dull, left sided chest pain this morning. There was no radiation, diaphoresis, n/v, or abdominal pain. He reports that he has experienced several episodes of pre-syncope, but no syncope.  EKG reveals Afib with RVR (HR in 100-110s) INR 2.9 Troponin negative Patient is highly symptomatic    Past Medical History  Diagnosis Date  . HYPOTHYROIDISM 05/20/2007  . HYPERLIPIDEMIA 10/20/2008  . DEPRESSION 12/17/2007  . MITRAL REGURGITATION 10/20/2008  . HYPERTENSION 04/10/2007  . CORONARY ARTERY DISEASE 04/10/2007    a. s/p stent OM 2002. b. s/p BMS 2006 RCA  c. s/p cath 2007.  [name] GERD 04/10/2007  . DIVERTICULOSIS, COLON 04/10/2007  . BENIGN PROSTATIC HYPERTROPHY 04/10/2007  . GYNECOMASTIA, UNILATERAL 03/23/2010  . SLEEP APNEA 05/20/2007  . WEAKNESS 11/09/2009  . Hematoma of abdominal wall   . Clotting disorder     anticoag. therapy  . Mallory - Weiss tear     > 5 years  ago  . Barrett's esophagus   . Popliteal artery embolism, right   . History of echocardiogram     a. EF 55% (TEE 08/2011)  . Paroxysmal a-fib      Past Surgical History  Procedure Laterality Date  . Transurethral resection of prostate    . Coronary angioplasty with stent placement    . Tee with cardioversion  7/08  . Cardioversion  8/08  . Esophagogastroduodenoscopy    . Tee without cardioversion  09/14/2011    Procedure: TRANSESOPHAGEAL ECHOCARDIOGRAM (TEE);  Surgeon: Wendall Stade, MD;  Location: [facility] ENDOSCOPY;  Service: Cardiovascular;  Laterality: N/A;  . Cardioversion  09/14/2011    Procedure: CARDIOVERSION;  Surgeon: Wendall Stade, MD;  Location: [facility] ENDOSCOPY;  Service: Cardiovascular;  Laterality: N/A;    No Known Allergies  I have reviewed the patient's current medications   Prior to Admission medications   Medication Sig Start Date End Date Taking? Authorizing Provider  acetaminophen (TYLENOL) 325 MG tablet Take 650 mg by mouth every 6 (six) hours as needed for headache.   Yes Historical Provider, MD  amiodarone (PACERONE) 200 MG tablet Take 1 tablet (200 mg total) by mouth daily. 11/03/11 02/03/14 Yes Rollene Rotunda, MD  amLODipine (NORVASC) 10 MG tablet Take 5 mg by mouth daily as needed. Depends on BP   Yes Historical Provider, MD  Cholecalciferol (VITAMIN D-3 PO) Take 7,000 Units by mouth daily.  Yes Historical Provider, MD  clonazePAM (KLONOPIN) 1 MG tablet Take 1 mg by mouth at bedtime.     Yes Historical Provider, MD  Coenzyme Q10 (CO Q 10) 100 MG CAPS Take 1 capsule by mouth daily.     Yes Historical Provider, MD  DIGESTIVE ENZYMES PO Take 1 tablet by mouth every evening.    Yes Historical Provider, MD  fish oil-omega-3 fatty acids 1000 MG capsule Take 3 g by mouth daily.   Yes Historical Provider, MD  levothyroxine (SYNTHROID, LEVOTHROID) 25 MCG tablet Take 25 mcg by mouth daily.    Yes Historical Provider, MD  Multiple Vitamin (MULTIVITAMIN) capsule Take 1 capsule  by mouth daily.   Yes Historical Provider, MD  nitroGLYCERIN (NITROSTAT) 0.4 MG SL tablet Place 0.4 mg under the tongue every 5 (five) minutes as needed for chest pain.    Yes Historical Provider, MD  pantoprazole (PROTONIX) 40 MG tablet Take 40 mg by mouth 2 (two) times daily.     Yes Historical Provider, MD  pravastatin (PRAVACHOL) 40 MG tablet Take 40 mg by mouth every evening. 03/07/11  Yes Dyann Kief, PA-C  PRESCRIPTION MEDICATION Place 1 drop into both eyes daily as needed (dry eyes).   Yes Historical Provider, MD  traZODone (DESYREL) 50 MG tablet Take 150 mg by mouth at bedtime. 1/2 pill at bedtime   Yes Historical Provider, MD  warfarin (COUMADIN) 5 MG tablet Take 2.5-5 mg by mouth every evening. Take a whole tablet (5mg ) on Monday Wednesday and Friday.  Take a half of a tablet (2.5mg ) on Tuesday, Thursday, Saturday and Sunday.   Yes Historical Provider, MD     History   Social History  . Marital Status: Married    Spouse Name: N/A    Number of Children: 1  . Years of Education: N/A   Occupational History  . retired    Social History Main Topics  . Smoking status: Never Smoker   . Smokeless tobacco: Never Used  . Alcohol Use: No  . Drug Use: No  . Sexual Activity: Not on file   Other Topics Concern  . Not on file   Social History Narrative   He is retired from [employer] certified [employer].   Mother lived into her 26's.  Father died in his 48's.  No CAD.    Family Status  Relation Status Death Age  . Other Alive    Family History  Problem Relation Age of Onset  . Hypertension Other   . Heart disease Brother     CAD male 1st degree relative     ROS:  Full 14 point review of systems complete and found to be negative unless listed above.  Physical Exam: Blood pressure 153/117, pulse 125, temperature 97.8 F (36.6 C), temperature source Oral, resp. rate 28, weight 185 lb (83.915 kg), SpO2 100.00%.  General: Well developed, well nourished, male in no acute distress Head:  Eyes PERRLA, No xanthomas.   Normocephalic and atraumatic, oropharynx without edema or exudate. Lungs: CTAB Heart: HRRR S1 S2, no rub/gallop, Heart irregular rate and rhythm with S1, S2  +2/6 holosystolicmurmur. pulses are 2+ extrem.   Neck: No carotid bruits. No lymphadenopathy. no  JVD. Abdomen: Bowel sounds present, abdomen soft and non-tender without masses or hernias noted. Extremities: No clubbing or cyanosis. Trace  edema.  Neuro: Alert and oriented X 3. No focal deficits noted. Psych:  Good affect, responds appropriately Skin: No rashes or lesions noted.  Labs:   Lab Results  Component Value Date   WBC 7.5 06/09/2013   HGB 12.0* 06/09/2013   HCT 36.8* 06/09/2013   MCV 90.2 06/09/2013   PLT 191 06/09/2013   No results found for this basename: INR,  in the last 72 hours   Recent Labs Lab 06/09/13 1304  NA 138  K 4.3  CL 102  CO2 23  BUN 23  CREATININE 1.36*  CALCIUM 9.2  GLUCOSE 93    Recent Labs  06/09/13 1342  TROPIPOC 0.00   Pro B Natriuretic peptide (BNP)  Date/Time Value Range Status  06/09/2013  1:05 PM 856.7* 0 - 450 pg/mL Final  09/13/2011 11:58 AM 775.0* 0 - 450 pg/mL Final   Lab Results  Component Value Date   CHOL 130 10/07/2008   HDL 61.10 10/07/2008   LDLCALC 63 10/07/2008   TRIG 32.0 10/07/2008   INR  Date/Time Value Range Status  05/28/2013 10:52 AM 2.9   Final  05/13/2013  1:21 PM 3.4   Final  04/29/2013  1:28 PM 3.6   Final  04/02/2013 11:09 AM 2.9   Final  03/19/2013 10:44 AM 3.8   Final  02/20/2013 10:08 AM 2.9   Final    Echo: 06/2011   LV EF: 55% - 60% ------------------------------------------------------------ Indications: Mitral regurgitation 424.0. ------------------------------------------------------------ History: PMH: Acquired from the patient and from the patient's chart. PMH: Atrial Fibrillation. CAD. Fatigue. Risk factors: Hypertension. Dyslipidemia. ------------------------------------------------------------ Study  Conclusions - Left ventricle: The cavity size was normal. Wall thickness was increased in a pattern of moderate LVH. Systolic function was normal. The estimated ejection fraction was in the range of 55% to 60%. Wall motion was normal; there were no regional wall motion abnormalities. Features are consistent with a pseudonormal left ventricular filling pattern, with concomitant abnormal relaxation and increased filling pressure (grade 2 diastolic dysfunction). - Aortic valve: Mild to moderate regurgitation. - Mitral valve: Mild regurgitation. - Left atrium: The atrium was mildly to moderately dilated. - Right ventricle: The cavity size was mildly dilated. - Right atrium: The atrium was mildly dilated. Echocardiography. M-mode, complete 2D, spectral Doppler, and color Doppler. Height: Height: 182.9cm. Height: 72in. Weight: Weight: 89.4kg. Weight: 196.6lb. Body mass index: BMI: 26.7kg/m^2. Body surface area: BSA: 2.60m^2. Blood pressure: 137/70. Patient status: Outpatient. Location:  Site 3 ------------------------------------------------------------ ----------------------------------------------------------- Left ventricle: The cavity size was normal. Wall thickness was increased in a pattern of moderate LVH. Systolic function was normal. The estimated ejection fraction was in the range of 55% to 60%. Wall motion was normal; there were no regional wall motion abnormalities. Features are consistent with a pseudonormal left ventricular filling pattern, with concomitant abnormal relaxation and increased filling pressure (grade 2 diastolic dysfunction). ------------------------------------------------------------ Aortic valve: Trileaflet; mildly thickened leaflets. Doppler: Mild to moderate regurgitation. ----------------------------------------------------------- Aorta: Ascending aorta: The ascending aorta was  mildly dilated. ------------------------------------------------------------ Mitral valve: Mildly thickened leaflets . Leaflet separation was normal. Doppler: Transvalvular velocity was within the normal range. There was no evidence for stenosis. Mild regurgitation. ------------------------------------------------------------ Left atrium: The atrium was mildly to moderately dilated. ------------------------------------------------------------ Right ventricle: The cavity size was mildly dilated. Systolic function was normal. ------------------------------------------------------------ Pulmonic valve: The valve appears to be grossly normal. Doppler: Mild regurgitation. ------------------------------------------------------------ Tricuspid valve: Doppler: Trivial regurgitation. ------------------------------------------------------------ Right atrium: The atrium was mildly dilated. ------------------------------------------------------------ Pericardium: There was no pericardial effusion.  ECG: EKG reveals Afib with RVR (HR in 100-110s)   Radiology:  Dg Chest 2 View  06/09/2013   CLINICAL DATA:  PALPITATIONS.  EXAM: CHEST  2 VIEW  COMPARISON:  09/13/2011  FINDINGS: The lungs are clear without focal infiltrate, edema, or effusion. Lungs are hyperexpanded. The cardio pericardial silhouette is enlarged. Bones are diffusely demineralized. [name]  IMPRESSION: Cardiomegaly without acute cardiopulmonary process.  .   Electronically Signed   By: Kennith Center M.D.   On: 06/09/2013 13:13    ASSESSMENT AND PLAN:    Active Problems:   HYPOTHYROIDISM   HYPERLIPIDEMIA   HYPERTENSION   CORONARY ARTERY DISEASE   GERD   Paroxysmal a-fib  [name] is a 79 y.o. male with a history of CAD s/p stent OM 2002, BMS to RCA 2006 & cath 2007 with residual disease, sleep apnea on CPAP, HTN, HLD, GERD with Barrett's esophagus, hypothyroidism, moderate MR, paroxysmal afib on amiodarone and coumadin who  presents to the emergency department in afib with RVR.  #1 Paroxysmal A-fib with RVR (HR 100-110s) hemodynamically stable, BP 153/117 -on amiodarone and coumadin (INR 2.9)  -order a TSH  #2 CAD- not on BB with history of low HR when in NSR  -not on aspirin because on coumadin  #3 Hypothyroidism- continue synthroid - TSH on 04/03/13 3.49  #4  HTN- continue amlodipine  #5  HLD- continue pravastatin  #6 GERD- continue protonix  SignedThereasa Parkin, PA-C 06/09/2013 5:05 PM   Co-Sign MD   History and all data above reviewed.  Patient examined.  I agree with the findings as above.  [name] has recurrent fib probably starting on Thursday or Friday.  He has increased fatigue which is his usual pattern.  He has otherwise done OK on amiodarone.  He has had therapeutic INRs.  The patient exam reveals ONG:[id number]  ,  Lungs: Clear  ,  Abd: Positive bowel sounds, no rebound no guarding, Ext No edema  .  All available labs, radiology testing, previous records reviewed. Agree with documented assessment and plan. I will plan DCCV if the INR is OK.  He will otherwise continue on the meds as listed.   [name]  5:51 PM  06/09/2013

## 2013-06-09 NOTE — ED Notes (Signed)
Reports sob

## 2013-06-09 NOTE — Progress Notes (Signed)
ANTICOAGULATION CONSULT NOTE - Initial Consult  Pharmacy Consult for Warfarin Indication: atrial fibrillation  No Known Allergies  Patient Measurements: Height: 5\' 9"  (175.3 cm) Weight: 181 lb 14.4 oz (82.509 kg) IBW/kg (Calculated) : 70.7   Vital Signs: Temp: 97.3 F (36.3 C) (11/24 1935) Temp src: Oral (11/24 1935) BP: 149/110 mmHg (11/24 1935) Pulse Rate: 102 (11/24 1915)  Labs:  Recent Labs  06/09/13 1304 06/09/13 2005  HGB 12.0*  --   HCT 36.8*  --   PLT 191  --   LABPROT  --  24.4*  INR  --  2.28*  CREATININE 1.36*  --     Estimated Creatinine Clearance: 40.4 ml/min (by C-G formula based on Cr of 1.36).   Medical History: Past Medical History  Diagnosis Date  . HYPOTHYROIDISM 05/20/2007  . HYPERLIPIDEMIA 10/20/2008  . DEPRESSION 12/17/2007  . MITRAL REGURGITATION 10/20/2008  . HYPERTENSION 04/10/2007  . CORONARY ARTERY DISEASE 04/10/2007    a. s/p stent OM 2002. b. s/p BMS 2006 RCA  c. s/p cath 2007.  [name] GERD 04/10/2007  . DIVERTICULOSIS, COLON 04/10/2007  . BENIGN PROSTATIC HYPERTROPHY 04/10/2007  . GYNECOMASTIA, UNILATERAL 03/23/2010  . SLEEP APNEA 05/20/2007  . WEAKNESS 11/09/2009  . Hematoma of abdominal wall   . Clotting disorder     anticoag. therapy  . Mallory - Weiss tear     > 5 years ago  . Barrett's esophagus   . Popliteal artery embolism, right   . History of echocardiogram     a. EF 55% (TEE 08/2011)  . Paroxysmal a-fib       Assessment: 86yom with Hx Afib on warfarin as outpt.  Admitted with AFib RVR 100s despite amiodarone pta plan for possible DCCV.  Admit INR 2.28.  No bleeding noted.  Per med rec pt has not had warfarin dose yet today.    Home dose warfarin 5mg  Mon, Thur, Sat, 2.5mg  all other days  Goal of Therapy:  INR 2-3 Monitor platelets by anticoagulation protocol: Yes   Plan:  Warfarin 5mg  x1 tonight Daily INR  Leota Sauers Pharm.D. CPP, BCPS Clinical Pharmacist [phone] 06/09/2013 8:38 PM

## 2013-06-09 NOTE — ED Notes (Signed)
Came from Dr. Jenene Slicker office for irregular heart beat--denies chest pain, has become increasingly short of breath.

## 2013-06-09 NOTE — ED Notes (Signed)
Pt states for the last couple of days his heart rate has been high and blood pressure elevation.  Pt has history of afib and appears to be in afib on ekg

## 2013-06-10 ENCOUNTER — Encounter (HOSPITAL_COMMUNITY): Payer: Self-pay | Admitting: Anesthesiology

## 2013-06-10 ENCOUNTER — Encounter (HOSPITAL_COMMUNITY): Payer: Medicare Other | Admitting: Anesthesiology

## 2013-06-10 ENCOUNTER — Observation Stay (HOSPITAL_COMMUNITY): Payer: Medicare Other | Admitting: Anesthesiology

## 2013-06-10 DIAGNOSIS — E039 Hypothyroidism, unspecified: Secondary | ICD-10-CM | POA: Diagnosis not present

## 2013-06-10 DIAGNOSIS — I2510 Atherosclerotic heart disease of native coronary artery without angina pectoris: Secondary | ICD-10-CM | POA: Diagnosis not present

## 2013-06-10 DIAGNOSIS — I10 Essential (primary) hypertension: Secondary | ICD-10-CM | POA: Diagnosis not present

## 2013-06-10 DIAGNOSIS — I4891 Unspecified atrial fibrillation: Secondary | ICD-10-CM | POA: Diagnosis not present

## 2013-06-10 LAB — PROTIME-INR
INR: 2.45 — ABNORMAL HIGH (ref 0.00–1.49)
Prothrombin Time: 25.8 seconds — ABNORMAL HIGH (ref 11.6–15.2)

## 2013-06-10 MED ORDER — OMEGA-3 FATTY ACIDS 1000 MG PO CAPS: 3.0000 g | ORAL_CAPSULE | Freq: Every day | ORAL | Status: DC

## 2013-06-10 MED ORDER — LIDOCAINE HCL (CARDIAC) 20 MG/ML IV SOLN
INTRAVENOUS | Status: DC | PRN
  Administered 2013-06-10: 80 mg via INTRAVENOUS

## 2013-06-10 MED ORDER — PROPOFOL 10 MG/ML IV BOLUS
INTRAVENOUS | Status: DC | PRN
  Administered 2013-06-10: 80 mg via INTRAVENOUS

## 2013-06-10 MED ORDER — AMIODARONE HCL 200 MG PO TABS
200.0000 mg | ORAL_TABLET | Freq: Every day | ORAL | Status: DC
  Administered 2013-06-10: 200 mg via ORAL
  Filled 2013-06-10: qty 1

## 2013-06-10 MED ORDER — WARFARIN SODIUM 2.5 MG PO TABS: 2.5000 mg | ORAL_TABLET | Freq: Every evening | ORAL | Status: DC

## 2013-06-10 MED ORDER — PANTOPRAZOLE SODIUM 40 MG PO TBEC
40.0000 mg | DELAYED_RELEASE_TABLET | Freq: Two times a day (BID) | ORAL | Status: DC
Start: 2013-06-10 — End: 2013-06-10
  Administered 2013-06-10: 40 mg via ORAL
  Filled 2013-06-10: qty 1

## 2013-06-10 MED ORDER — TRAZODONE HCL 150 MG PO TABS
150.0000 mg | ORAL_TABLET | Freq: Every day | ORAL | Status: DC
  Filled 2013-06-10: qty 1

## 2013-06-10 MED ORDER — LEVOTHYROXINE SODIUM 25 MCG PO TABS
25.0000 ug | ORAL_TABLET | Freq: Every day | ORAL | Status: DC
  Administered 2013-06-10: 25 ug via ORAL
  Filled 2013-06-10: qty 1

## 2013-06-10 MED ORDER — SODIUM CHLORIDE 0.9 % IV SOLN
INTRAVENOUS | Status: DC | PRN
  Administered 2013-06-10: 15:00:00 via INTRAVENOUS

## 2013-06-10 MED ORDER — OMEGA-3-ACID ETHYL ESTERS 1 G PO CAPS
3.0000 g | ORAL_CAPSULE | Freq: Every day | ORAL | Status: DC
  Administered 2013-06-10: 3 g via ORAL
  Filled 2013-06-10: qty 3

## 2013-06-10 MED ORDER — NITROGLYCERIN 0.4 MG SL SUBL
0.4000 mg | SUBLINGUAL_TABLET | SUBLINGUAL | Status: DC | PRN
Start: 2013-06-10 — End: 2013-06-10

## 2013-06-10 MED ORDER — AMLODIPINE BESYLATE 5 MG PO TABS
5.0000 mg | ORAL_TABLET | Freq: Every day | ORAL | Status: DC
  Administered 2013-06-10: 5 mg via ORAL
  Filled 2013-06-10: qty 1

## 2013-06-10 MED ORDER — CO Q 10 100 MG PO CAPS: 1.0000 | ORAL_CAPSULE | Freq: Every day | ORAL | Status: DC

## 2013-06-10 MED ORDER — CLONAZEPAM 1 MG PO TABS
1.0000 mg | ORAL_TABLET | Freq: Every day | ORAL | Status: DC
Start: 2013-06-10 — End: 2013-06-10

## 2013-06-10 MED ORDER — VITAMIN D-3 125 MCG (5000 UT) PO TABS: 7000.0000 [IU] | ORAL_TABLET | Freq: Every day | ORAL | Status: DC

## 2013-06-10 MED ORDER — VITAMIN D3 25 MCG (1000 UNIT) PO TABS
1000.0000 [IU] | ORAL_TABLET | Freq: Every day | ORAL | Status: DC
  Administered 2013-06-10: 1000 [IU] via ORAL
  Filled 2013-06-10: qty 1

## 2013-06-10 MED ORDER — ADULT MULTIVITAMIN W/MINERALS CH
1.0000 | ORAL_TABLET | Freq: Every day | ORAL | Status: DC
  Administered 2013-06-10: 1 via ORAL
  Filled 2013-06-10: qty 1

## 2013-06-10 MED ORDER — SIMVASTATIN 20 MG PO TABS
20.0000 mg | ORAL_TABLET | Freq: Every day | ORAL | Status: DC
  Filled 2013-06-10: qty 1

## 2013-06-10 MED ORDER — WARFARIN SODIUM 2.5 MG PO TABS
2.5000 mg | ORAL_TABLET | Freq: Once | ORAL | Status: DC
  Filled 2013-06-10: qty 1

## 2013-06-10 MED ORDER — MULTIVITAMINS PO CAPS: 1.0000 | ORAL_CAPSULE | Freq: Every day | ORAL | Status: DC

## 2013-06-10 MED ORDER — LEVOTHYROXINE SODIUM 25 MCG PO TABS
25.0000 ug | ORAL_TABLET | Freq: Every day | ORAL | Status: DC
  Filled 2013-06-10: qty 1

## 2013-06-10 NOTE — Discharge Summary (Signed)
Discharge Summary   Patient ID: [name],  MRN: [id number], DOB/AGE: 03-06-1927 79 y.o.  Admit date: 06/09/2013 Discharge date: 06/10/2013  Primary Care Provider: Rogelia Boga Primary Cardiologist: J. Hochrein, MD   Discharge Diagnoses Principal Problem:   Paroxysmal a-fib  **s/p DCCV this admission. Active Problems:   HYPERTENSION   CORONARY ARTERY DISEASE   HYPOTHYROIDISM   HYPERLIPIDEMIA   GERD  Allergies No Known Allergies  Procedures  DCCV 11.25.2014  S/p successful cardioversion from atrial fibrillation to sinus rhythm.  History of Present Illness  79 y/o male with a h/o CAD s/p prior stenting as well as HTN, hyperlipidmia, OSA, GERD, and PAF on chronic amiodarone and coumadin therapy.  He was in his usual state of health until approximately 4 days prior to admission, when he began to experience weakness, lethargy, and mild dyspnea, which he identified as an indication that he was back in atrial fibrillation.  He was seen in the office on 11/24 and was indeed found to be in atrial fibrillation with rates in the 100's to 110's.  Decision was made to admit him for rate control and repeat cardioversion.  Hospital Course  Following admission, pt remained in reasonably rate controlled afib.  INR was therapeutic and therefor, cardioversion was successfully carried out this afternoon.  Patient tolerated procedure well and post-procedure has maintained sinus rhythm.  He will be discharged home this afternoon in good condition.  Of note, TSH was found to be mildly elevated.  He is on thyroid replacement therapy at home.  A Free T4 was drawn prior to d/c and is pending at this time.  Discharge Vitals Blood pressure 166/97, pulse 114, temperature 98.2 F (36.8 C), temperature source Oral, resp. rate 20, height 5\' 9"  (1.753 m), weight 181 lb 14.4 oz (82.509 kg), SpO2 96.00%.  Filed Weights   06/09/13 1232 06/09/13 1935  Weight: 185 lb (83.915 kg) 181 lb 14.4  oz (82.509 kg)   Labs  CBC  Recent Labs  06/09/13 1304  WBC 7.5  HGB 12.0*  HCT 36.8*  MCV 90.2  PLT 191   Basic Metabolic Panel  Recent Labs  06/09/13 1304  NA 138  K 4.3  CL 102  CO2 23  GLUCOSE 93  BUN 23  CREATININE 1.36*  CALCIUM 9.2   Thyroid Function Tests  Recent Labs  06/10/13 0737  TSH 6.789*   Disposition  Pt is being discharged home today in good condition.  Follow-up Plans & Appointments  Follow-up Information   Follow up with Rollene Rotunda, MD On 07/03/2013. (2:45 PM)    Specialty:  Cardiology   Contact information:   [address] [place] [address] [phone]       Follow up with Rogelia Boga, MD. (as scheduled.)    Specialty:  Internal Medicine   Contact information:   [address] [place] [address] [phone]       Follow up with [facility] On 06/18/2013. (8:15 AM)    Contact information:   [address] [place] [address]      Discharge Medications    Medication List    STOP taking these medications       acetaminophen 325 MG tablet  Commonly known as:  TYLENOL      TAKE these medications       amiodarone 200 MG tablet  Commonly known as:  PACERONE  Take 1 tablet (200 mg total) by  mouth daily.     amLODipine 10 MG tablet  Commonly known as:  NORVASC  Take 5 mg by mouth daily as needed. Depends on BP     clonazePAM 1 MG tablet  Commonly known as:  KLONOPIN  Take 1 mg by mouth at bedtime.     Co Q 10 100 MG Caps  Take 1 capsule by mouth daily.     DIGESTIVE ENZYMES PO  Take 1 tablet by mouth every evening.     fish oil-omega-3 fatty acids 1000 MG capsule  Take 3 g by mouth daily.     levothyroxine 25 MCG tablet  Commonly known as:  SYNTHROID, LEVOTHROID  Take 25 mcg by mouth daily.     multivitamin capsule  Take 1 capsule by mouth daily.     nitroGLYCERIN 0.4 MG SL tablet  Commonly known as:   NITROSTAT  Place 0.4 mg under the tongue every 5 (five) minutes as needed for chest pain.     pantoprazole 40 MG tablet  Commonly known as:  PROTONIX  Take 40 mg by mouth 2 (two) times daily.     pravastatin 40 MG tablet  Commonly known as:  PRAVACHOL  Take 40 mg by mouth every evening.     PRESCRIPTION MEDICATION  Place 1 drop into both eyes daily as needed (dry eyes).     traZODone 50 MG tablet  Commonly known as:  DESYREL  Take 150 mg by mouth at bedtime. 1/2 pill at bedtime     VITAMIN D-3 PO  Take 7,000 Units by mouth daily.     warfarin 5 MG tablet  Commonly known as:  COUMADIN  Take 2.5-5 mg by mouth every evening. Take 2.5mg  (half a tablet) daily except 5mg  (1 tablet) on Mondays, Thursdays, and Saturdays       Outstanding Labs/Studies  *Free T4 drawn prior to d/c. *F/u INR in coumadin clinic on 12/3.  Duration of Discharge Encounter   Greater than 30 minutes including physician time.  Signed, Nicolasa Ducking NP 06/10/2013, 3:17 PM

## 2013-06-10 NOTE — Anesthesia Procedure Notes (Signed)
Procedure Name: MAC Date/Time: 06/10/2013 3:16 PM Performed by: Tyrone Nine Pre-anesthesia Checklist: Patient identified, Emergency Drugs available, Suction available, Patient being monitored and Timeout performed Patient Re-evaluated:Patient Re-evaluated prior to inductionOxygen Delivery Method: Non-rebreather mask Preoxygenation: Pre-oxygenation with 100% oxygen Intubation Type: IV induction Ventilation: Mask ventilation without difficulty

## 2013-06-10 NOTE — Anesthesia Preprocedure Evaluation (Addendum)
Anesthesia Evaluation  Patient identified by MRN, date of birth, ID band Patient awake    Reviewed: Allergy & Precautions, H&P , NPO status   Airway Mallampati: II TM Distance: >3 FB Neck ROM: Full    Dental  (+) Teeth Intact and Dental Advisory Given   Pulmonary sleep apnea ,    Pulmonary exam normal       Cardiovascular hypertension, Pt. on medications + CAD and + Peripheral Vascular Disease + dysrhythmias Rhythm:Irregular Rate:Tachycardia  09-Jun-2013 12:29:41 [facility]/ED ROUTINE RECORD Atrial fibrillation with rapid ventricular response with premature ventricular or aberrantly conducted complexes Incomplete right bundle branch block Nonspecific ST and T wave abnormality  06-09-13 IMPRESSION: Cardiomegaly without acute cardiopulmonary process.  .       Neuro/Psych PSYCHIATRIC DISORDERS Depression    GI/Hepatic Neg liver ROS, GERD-  Medicated and Controlled,  Endo/Other  Hypothyroidism   Renal/GU negative Renal ROS  negative genitourinary   Musculoskeletal  (+) Arthritis -,   Abdominal   Peds  Hematology   Anesthesia Other Findings   Reproductive/Obstetrics negative OB ROS                      Anesthesia Physical Anesthesia Plan  ASA: III  Anesthesia Plan: General   Post-op Pain Management:    Induction: Intravenous  Airway Management Planned: Mask  Additional Equipment:   Intra-op Plan:   Post-operative Plan:   Informed Consent: I have reviewed the patients History and Physical, chart, labs and discussed the procedure including the risks, benefits and alternatives for the proposed anesthesia with the patient or authorized representative who has indicated his/her understanding and acceptance.   Dental advisory given  Plan Discussed with: CRNA and Surgeon  Anesthesia Plan Comments:        Anesthesia Quick Evaluation

## 2013-06-10 NOTE — Procedures (Signed)
Electrical Cardioversion Procedure Note [name] [id number] [birth year]  Procedure: Electrical Cardioversion Indications:  Atrial Fibrillation  Procedure Details Consent: Risks of procedure as well as the alternatives and risks of each were explained to the (patient/caregiver).  Consent for procedure obtained. Time Out: Verified patient identification, verified procedure, site/side was marked, verified correct patient position, special equipment/implants available, medications/allergies/relevent history reviewed, required imaging and test results available.  Performed  Patient placed on cardiac monitor, pulse oximetry, supplemental oxygen as necessary.  Sedation given: Patient sedated by anesthesia with diprovan 80 mg IV. Pacer pads placed anterior and posterior chest.  Cardioverted 1 time(s).  Cardioverted at 120J.  Evaluation Findings: Post procedure EKG shows: NSR Complications: None Patient did tolerate procedure well.   [name] 06/10/2013, 2:53 PM

## 2013-06-10 NOTE — Transfer of Care (Signed)
Immediate Anesthesia Transfer of Care Note  Patient: [name]  Procedure(s) Performed: * No procedures listed *  Patient Location: PACU and Nursing Unit  Anesthesia Type:General  Level of Consciousness: awake, oriented and patient cooperative  Airway & Oxygen Therapy: Patient Spontanous Breathing and Patient connected to nasal cannula oxygen  Post-op Assessment: Report given to PACU RN and Post -op Vital signs reviewed and stable  Post vital signs: Reviewed and stable  Complications: No apparent anesthesia complications

## 2013-06-10 NOTE — Progress Notes (Signed)
Patient stable after cardioversion, eaten dinner and ambulated in the hallway without complaints.  Reviewed discharge instructions with patient and he stated his understanding. Discharged home with wife via wheelchair.  [name]

## 2013-06-10 NOTE — Preoperative (Signed)
Beta Blockers   Reason not to administer Beta Blockers:Not Applicable 

## 2013-06-10 NOTE — ED Provider Notes (Signed)
This patient was seen in conjunction with the Resident Physician, Dr. Arlie Solomons.  The documentation is accurate and reflects the evaluation.  On my exam, this patient was in no distress.  With the description of weakness, and his ongoing atrial fibrillation, he was admitted to the cardiology team for further evaluation and management.  I have seen the ECG and my interpretation is in MUSE.   Gerhard Munch, MD 06/10/13 [phone]

## 2013-06-10 NOTE — Progress Notes (Signed)
SUBJECTIVE:  No acute SOB.  No pain.  Weak   PHYSICAL EXAM Filed Vitals:   06/09/13 1815 06/09/13 1915 06/09/13 1935 06/10/13 0606  BP: 144/68 117/98 149/110 105/69  Pulse: 87 102    Temp:   97.3 F (36.3 C) 98.1 F (36.7 C)  TempSrc:   Oral Oral  Resp: 24 27 22 20   Height:   5\' 9"  (1.753 m)   Weight:   181 lb 14.4 oz (82.509 kg)   SpO2: 99% 99% 100% 96%   General:  No distress Lungs:  Clear Heart:  Irregular Abdomen:  Positive bowel sounds, no rebound no guarding Extremities:  No edema  LABS:  Results for orders placed during the hospital encounter of 06/09/13 (from the past 24 hour(s))  CBC     Status: Abnormal   Collection Time    06/09/13  1:04 PM      Result Value Range   WBC 7.5  4.0 - 10.5 K/uL   RBC 4.08 (*) 4.22 - 5.81 MIL/uL   Hemoglobin 12.0 (*) 13.0 - 17.0 g/dL   HCT 29.5 (*) 28.4 - 13.2 %   MCV 90.2  78.0 - 100.0 fL   MCH 29.4  26.0 - 34.0 pg   MCHC 32.6  30.0 - 36.0 g/dL   RDW 44.0 (*) 10.2 - 72.5 %   Platelets 191  150 - 400 K/uL  BASIC METABOLIC PANEL     Status: Abnormal   Collection Time    06/09/13  1:04 PM      Result Value Range   Sodium 138  135 - 145 mEq/L   Potassium 4.3  3.5 - 5.1 mEq/L   Chloride 102  96 - 112 mEq/L   CO2 23  19 - 32 mEq/L   Glucose, Bld 93  70 - 99 mg/dL   BUN 23  6 - 23 mg/dL   Creatinine, Ser 3.66 (*) 0.50 - 1.35 mg/dL   Calcium 9.2  8.4 - 44.0 mg/dL   GFR calc non Af Amer 46 (*) >90 mL/min   GFR calc Af Amer 53 (*) >90 mL/min  PRO B NATRIURETIC PEPTIDE     Status: Abnormal   Collection Time    06/09/13  1:05 PM      Result Value Range   Pro B Natriuretic peptide (BNP) 856.7 (*) 0 - 450 pg/mL  POCT I-STAT TROPONIN I     Status: None   Collection Time    06/09/13  1:42 PM      Result Value Range   Troponin i, poc 0.00  0.00 - 0.08 ng/mL   Comment 3           PROTIME-INR     Status: Abnormal   Collection Time    06/09/13  8:05 PM      Result Value Range   Prothrombin Time 24.4 (*) 11.6 - 15.2 seconds     INR 2.28 (*) 0.00 - 1.49    Intake/Output Summary (Last 24 hours) at 06/10/13 0651 Last data filed at 06/10/13 3474  Gross per 24 hour  Intake      0 ml  Output    500 ml  Net   -500 ml    ASSESSMENT AND PLAN:  ATRIAL FIB:  NPO for DCCV today.   INR is therapeutic.   DYSPNEA:  Pro BNP is slightly elevated but he seems to be euvolemic.  Continue current therapy.    Note/  Discharge today if back in  NSRRollene Rotunda 06/10/2013 6:51 AM

## 2013-06-10 NOTE — Anesthesia Postprocedure Evaluation (Signed)
  Anesthesia Post-op Note  Patient: [name]  Cardioversion Patient Location: PACU and Nursing Unit  Anesthesia Type:General  Level of Consciousness: awake, oriented and patient cooperative  Airway and Oxygen Therapy: Patient Spontanous Breathing and Patient connected to nasal cannula oxygen  Post-op Pain: none   Post-op Assessment: Post-op Vital signs reviewed and Patient's Cardiovascular Status Stable  Post-op Vital Signs: Reviewed and stable  Complications: No apparent anesthesia complications

## 2013-06-10 NOTE — Progress Notes (Signed)
ANTICOAGULATION CONSULT NOTE - Follow Up Consult  Pharmacy Consult for Coumadin Indication: atrial fibrillation  No Known Allergies  Patient Measurements: Height: 5\' 9"  (175.3 cm) Weight: 181 lb 14.4 oz (82.509 kg) IBW/kg (Calculated) : 70.7  Vital Signs: Temp: 98.1 F (36.7 C) (11/25 0606) Temp src: Oral (11/25 0606) BP: 105/69 mmHg (11/25 0606)  Labs:  Recent Labs  06/09/13 1304 06/09/13 2005 06/10/13 0737  HGB 12.0*  --   --   HCT 36.8*  --   --   PLT 191  --   --   LABPROT  --  24.4* 25.8*  INR  --  2.28* 2.45*  CREATININE 1.36*  --   --     Estimated Creatinine Clearance: 40.4 ml/min (by C-G formula based on Cr of 1.36).  Assessment: 86yom on coumadin pta for PAF, admitted with afib RVR. INR is therapeutic on home regimen. Plan for DCCV today.   Goal of Therapy:  INR 2-3 Monitor platelets by anticoagulation protocol: Yes   Plan:  1) Coumadin 2.5mg  x 1  2) INR in AM if still here  Fredrik Rigger 06/10/2013,9:42 AM

## 2013-06-10 NOTE — Progress Notes (Signed)
UR completed 

## 2013-06-16 DIAGNOSIS — L259 Unspecified contact dermatitis, unspecified cause: Secondary | ICD-10-CM | POA: Diagnosis not present

## 2013-06-16 DIAGNOSIS — B356 Tinea cruris: Secondary | ICD-10-CM | POA: Diagnosis not present

## 2013-06-18 ENCOUNTER — Ambulatory Visit (INDEPENDENT_AMBULATORY_CARE_PROVIDER_SITE_OTHER): Payer: Medicare Other | Admitting: *Deleted

## 2013-06-18 DIAGNOSIS — Z7901 Long term (current) use of anticoagulants: Secondary | ICD-10-CM

## 2013-06-18 DIAGNOSIS — I4891 Unspecified atrial fibrillation: Secondary | ICD-10-CM

## 2013-07-03 ENCOUNTER — Ambulatory Visit (INDEPENDENT_AMBULATORY_CARE_PROVIDER_SITE_OTHER): Payer: Medicare Other | Admitting: Cardiology

## 2013-07-03 ENCOUNTER — Encounter: Payer: Self-pay | Admitting: Cardiology

## 2013-07-03 ENCOUNTER — Ambulatory Visit (INDEPENDENT_AMBULATORY_CARE_PROVIDER_SITE_OTHER): Payer: Medicare Other | Admitting: *Deleted

## 2013-07-03 VITALS — BP 122/72 | HR 64 | Ht 69.0 in | Wt 192.0 lb

## 2013-07-03 DIAGNOSIS — Z7901 Long term (current) use of anticoagulants: Secondary | ICD-10-CM | POA: Diagnosis not present

## 2013-07-03 DIAGNOSIS — I4891 Unspecified atrial fibrillation: Secondary | ICD-10-CM

## 2013-07-03 DIAGNOSIS — I480 Paroxysmal atrial fibrillation: Secondary | ICD-10-CM

## 2013-07-03 DIAGNOSIS — I10 Essential (primary) hypertension: Secondary | ICD-10-CM

## 2013-07-03 NOTE — Patient Instructions (Signed)
The current medical regimen is effective;  continue present plan and medications.  Follow up in 3 months with Dr Hochrein. 

## 2013-07-03 NOTE — Progress Notes (Signed)
HPI The patient presents for followup after recent hospitalization for atrial fibrillation. He underwent cardioversion.  Since that time he is very tired.  However, it is not the same fatigue that he has with his fibrillation.  The patient denies any new symptoms such as chest discomfort, neck or arm discomfort. There has been no new shortness of breath, PND or orthopnea.  He is unsteady on his feet and walks with a cane.   No Known Allergies  Current Outpatient Prescriptions  Medication Sig Dispense Refill  . amiodarone (PACERONE) 200 MG tablet Take 1 tablet (200 mg total) by mouth daily.      [name] amLODipine (NORVASC) 10 MG tablet Take 5 mg by mouth daily as needed. Depends on BP      . Cholecalciferol (VITAMIN D-3 PO) Take 7,000 Units by mouth daily.      . clonazePAM (KLONOPIN) 1 MG tablet Take 1 mg by mouth at bedtime.        . Coenzyme Q10 (CO Q 10) 100 MG CAPS Take 1 capsule by mouth daily.        [name] DIGESTIVE ENZYMES PO Take 1 tablet by mouth every evening.       . fish oil-omega-3 fatty acids 1000 MG capsule Take 3 g by mouth daily.      [name] levothyroxine (SYNTHROID, LEVOTHROID) 25 MCG tablet Take 25 mcg by mouth daily.       . Multiple Vitamin (MULTIVITAMIN) capsule Take 1 capsule by mouth daily.      . nitroGLYCERIN (NITROSTAT) 0.4 MG SL tablet Place 0.4 mg under the tongue every 5 (five) minutes as needed for chest pain.       . pantoprazole (PROTONIX) 40 MG tablet Take 40 mg by mouth 2 (two) times daily.        . pravastatin (PRAVACHOL) 40 MG tablet Take 40 mg by mouth every evening.      [name] PRESCRIPTION MEDICATION Place 1 drop into both eyes daily as needed (dry eyes).      . traZODone (DESYREL) 50 MG tablet Take 150 mg by mouth at bedtime. 1/2 pill at bedtime      . warfarin (COUMADIN) 5 MG tablet Take 2.5-5 mg by mouth every evening. Take 2.5mg  (half a tablet) daily except 5mg  (1 tablet) on Mondays, Thursdays, and Saturdays       No current facility-administered medications for this  visit.    Past Medical History  Diagnosis Date  . HYPOTHYROIDISM 05/20/2007  . HYPERLIPIDEMIA 10/20/2008  . DEPRESSION 12/17/2007  . MITRAL REGURGITATION 10/20/2008  . HYPERTENSION 04/10/2007  . CORONARY ARTERY DISEASE     a. s/p stent OM 2002. b. s/p BMS 2006 RCA  c. s/p cath 2007.  [name] GERD 04/10/2007  . DIVERTICULOSIS, COLON 04/10/2007  . BENIGN PROSTATIC HYPERTROPHY 04/10/2007  . GYNECOMASTIA, UNILATERAL 03/23/2010  . SLEEP APNEA 05/20/2007  . WEAKNESS 11/09/2009  . Hematoma of abdominal wall   . Clotting disorder     anticoag. therapy  . Mallory - Weiss tear     > 5 years ago  . Barrett's esophagus   . Popliteal artery embolism, right   . History of echocardiogram     a. EF 55% (TEE 08/2011)  . Paroxysmal a-fib     a. chronic amiodarone and coumadin;  b. 05/2013 s/p DCCV.    Past Surgical History  Procedure Laterality Date  . Transurethral resection of prostate    . Coronary angioplasty with stent placement    . Rhae Hammock  with cardioversion  7/08  . Cardioversion  8/08  . Esophagogastroduodenoscopy    . Tee without cardioversion  09/14/2011    Procedure: TRANSESOPHAGEAL ECHOCARDIOGRAM (TEE);  Surgeon: Wendall Stade, MD;  Location: [facility] ENDOSCOPY;  Service: Cardiovascular;  Laterality: N/A;  . Cardioversion  09/14/2011    Procedure: CARDIOVERSION;  Surgeon: Wendall Stade, MD;  Location: [facility] ENDOSCOPY;  Service: Cardiovascular;  Laterality: N/A;    ROS:  As stated in the HPI and negative for all other systems.  PHYSICAL EXAM BP 122/72  Pulse 64  Ht 5\' 9"  (1.753 m)  Wt 192 lb (87.091 kg)  BMI 28.34 kg/m2 GENERAL: No acute distress HEENT:  Pupils equal round and reactive, fundi not visualized, oral mucosa unremarkable NECK:  No jugular venous distention, waveform within normal limits, carotid upstroke brisk and symmetric, no bruits, no thyromegaly LYMPHATICS:  No cervical, inguinal adenopathy LUNGS:  Clear to auscultation bilaterally BACK:  No CVA tenderness CHEST:   Unremarkable HEART:  PMI not displaced or sustained,S1 and S2 within normal limits, no S3, no S4, no clicks, no rubs, apical and axillary late systolic murmur ABD:  Flat, positive bowel sounds normal in frequency in pitch, no bruits, no rebound, no guarding, no midline pulsatile mass, no hepatomegaly, no splenomegaly EXT:  2 plus pulses throughout, no edema, no cyanosis no clubbing PSYCH:  Continued flat affect  ASSESSMENT AND PLAN  ATRIAL FIBRILLATION - He will remain on the meds as listed.  He is up to date with lab follow up.   CORONARY ARTERY DISEASE -  The patient has no new sypmtoms.  No further cardiovascular testing is indicated.  We will continue with aggressive risk reduction and meds as listed.  HYPERTENSION -  I will reduce his Norvasc to 2.5 mg daily.    MITRAL REGURGITATION -  He has had only mild mitral regurgitation and I would not expect this to be different.  No change in therapy is indicated.   FATIGUE - He is more fatigued since his hospitalization. However, I don't see a reversible cause. He will continue the therapy as indicated.

## 2013-07-16 ENCOUNTER — Ambulatory Visit (INDEPENDENT_AMBULATORY_CARE_PROVIDER_SITE_OTHER): Payer: Medicare Other | Admitting: Internal Medicine

## 2013-07-16 ENCOUNTER — Encounter: Payer: Self-pay | Admitting: Internal Medicine

## 2013-07-16 VITALS — BP 130/80 | HR 62 | Temp 97.7°F | Resp 20 | Ht 68.5 in | Wt 197.0 lb

## 2013-07-16 DIAGNOSIS — R5381 Other malaise: Secondary | ICD-10-CM | POA: Diagnosis not present

## 2013-07-16 DIAGNOSIS — Z23 Encounter for immunization: Secondary | ICD-10-CM

## 2013-07-16 DIAGNOSIS — I10 Essential (primary) hypertension: Secondary | ICD-10-CM | POA: Diagnosis not present

## 2013-07-16 DIAGNOSIS — E039 Hypothyroidism, unspecified: Secondary | ICD-10-CM

## 2013-07-16 DIAGNOSIS — F329 Major depressive disorder, single episode, unspecified: Secondary | ICD-10-CM | POA: Diagnosis not present

## 2013-07-16 DIAGNOSIS — I4891 Unspecified atrial fibrillation: Secondary | ICD-10-CM

## 2013-07-16 DIAGNOSIS — I2510 Atherosclerotic heart disease of native coronary artery without angina pectoris: Secondary | ICD-10-CM

## 2013-07-16 NOTE — Progress Notes (Signed)
Pre-visit discussion using our clinic review tool. No additional management support is needed unless otherwise documented below in the visit note.  

## 2013-07-16 NOTE — Patient Instructions (Signed)
Limit your sodium (Salt) intake    It is important that you exercise regularly, at least 20 minutes 3 to 4 times per week.  If you develop chest pain or shortness of breath seek  medical attention.  Return in 3 months for follow-up  

## 2013-07-16 NOTE — Progress Notes (Signed)
Subjective:    Patient ID: [name], male    DOB: 09-26-1926, 79 y.o.   MRN: [id number]  HPI 79 year old patient who is seen today in followup. He is followed closely by cardiology for paroxysmal atrial for relation. He was hospitalized briefly one month ago and required cardioversion. Since that time he has had weakness. This has been an intermittent problem for years. Prior to this brief hospitalization he was walking 1 mile 5 times per week. At the present time he states he does not have the energy to walk prolonged distances. His heart rhythm has been stable. Medical regimen includes amiodarone. He has had a recent laboratory screen including TSH which were normal. For the past 2 days she has had some discomfort in the left temporal area. No visual disturbances    Admit date: 06/09/2013  Discharge date: 06/10/2013  Primary Care Provider: Rogelia Boga  Primary Cardiologist: J. Hochrein, MD  Discharge Diagnoses  Principal Problem:  Paroxysmal a-fib  **s/p DCCV this admission.  Active Problems:  HYPERTENSION  CORONARY ARTERY DISEASE  HYPOTHYROIDISM  HYPERLIPIDEMIA  GERD  Allergies  No Known Allergies  Procedures  DCCV 11.25.2014  Past Medical History  Diagnosis Date  . HYPOTHYROIDISM 05/20/2007  . HYPERLIPIDEMIA 10/20/2008  . DEPRESSION 12/17/2007  . MITRAL REGURGITATION 10/20/2008  . HYPERTENSION 04/10/2007  . CORONARY ARTERY DISEASE     a. s/p stent OM 2002. b. s/p BMS 2006 RCA  c. s/p cath 2007.  [name] GERD 04/10/2007  . DIVERTICULOSIS, COLON 04/10/2007  . BENIGN PROSTATIC HYPERTROPHY 04/10/2007  . GYNECOMASTIA, UNILATERAL 03/23/2010  . SLEEP APNEA 05/20/2007  . WEAKNESS 11/09/2009  . Hematoma of abdominal wall   . Clotting disorder     anticoag. therapy  . Mallory - Weiss tear     > 5 years ago  . Barrett's esophagus   . Popliteal artery embolism, right   . History of echocardiogram     a. EF 55% (TEE 08/2011)  . Paroxysmal a-fib     a. chronic amiodarone and  coumadin;  b. 05/2013 s/p DCCV.    History   Social History  . Marital Status: Married    Spouse Name: N/A    Number of Children: 1  . Years of Education: N/A   Occupational History  . retired    Social History Main Topics  . Smoking status: Never Smoker   . Smokeless tobacco: Never Used  . Alcohol Use: No  . Drug Use: No  . Sexual Activity: Not on file   Other Topics Concern  . Not on file   Social History Narrative   He is retired from [employer].   Mother lived into her 62's.  Father died in his 38's.  No CAD.    Past Surgical History  Procedure Laterality Date  . Transurethral resection of prostate    . Coronary angioplasty with stent placement    . Tee with cardioversion  7/08  . Cardioversion  8/08  . Esophagogastroduodenoscopy    . Tee without cardioversion  09/14/2011    Procedure: TRANSESOPHAGEAL ECHOCARDIOGRAM (TEE);  Surgeon: Wendall Stade, MD;  Location: [facility] ENDOSCOPY;  Service: Cardiovascular;  Laterality: N/A;  . Cardioversion  09/14/2011    Procedure: CARDIOVERSION;  Surgeon: Wendall Stade, MD;  Location: [facility] ENDOSCOPY;  Service: Cardiovascular;  Laterality: N/A;    Family History  Problem Relation Age of Onset  . Hypertension Other   . Heart disease Brother     CAD male 1st  degree relative    No Known Allergies  Current Outpatient Prescriptions on File Prior to Visit  Medication Sig Dispense Refill  . amiodarone (PACERONE) 200 MG tablet Take 1 tablet (200 mg total) by mouth daily.      [name] amLODipine (NORVASC) 10 MG tablet Take 5 mg by mouth daily as needed. Depends on BP      . Cholecalciferol (VITAMIN D-3 PO) Take 7,000 Units by mouth daily.      . clonazePAM (KLONOPIN) 1 MG tablet Take 1 mg by mouth at bedtime.        . Coenzyme Q10 (CO Q 10) 100 MG CAPS Take 1 capsule by mouth daily.        [name] DIGESTIVE ENZYMES PO Take 1 tablet by mouth every evening.       . fish oil-omega-3 fatty acids 1000 MG capsule Take 3 g by mouth daily.      [name]  levothyroxine (SYNTHROID, LEVOTHROID) 25 MCG tablet Take 25 mcg by mouth daily.       . Multiple Vitamin (MULTIVITAMIN) capsule Take 1 capsule by mouth daily.      . nitroGLYCERIN (NITROSTAT) 0.4 MG SL tablet Place 0.4 mg under the tongue every 5 (five) minutes as needed for chest pain.       . pantoprazole (PROTONIX) 40 MG tablet Take 40 mg by mouth 2 (two) times daily.        . pravastatin (PRAVACHOL) 40 MG tablet Take 40 mg by mouth every evening.      [name] PRESCRIPTION MEDICATION Place 1 drop into both eyes daily as needed (dry eyes).      . traZODone (DESYREL) 50 MG tablet Take 25 mg by mouth at bedtime. 1/2 pill at bedtime      . warfarin (COUMADIN) 5 MG tablet Take 2.5-5 mg by mouth every evening. Take 2.5mg  (half a tablet) daily except 5mg  (1 tablet) on Mondays, Thursdays, and Saturdays       No current facility-administered medications on file prior to visit.    BP 130/80  Pulse 62  Temp(Src) 97.7 F (36.5 C) (Oral)  Resp 20  Ht 5' 8.5" (1.74 m)  Wt 197 lb (89.359 kg)  BMI 29.51 kg/m2  SpO2 98%    Review of Systems  Constitutional: Positive for activity change and fatigue. Negative for fever, chills and appetite change.  HENT: Negative for congestion, dental problem, ear pain, hearing loss, sore throat, tinnitus, trouble swallowing and voice change.   Eyes: Negative for pain, discharge and visual disturbance.  Respiratory: Negative for cough, chest tightness, wheezing and stridor.   Cardiovascular: Negative for chest pain, palpitations and leg swelling.  Gastrointestinal: Negative for nausea, vomiting, abdominal pain, diarrhea, constipation, blood in stool and abdominal distention.  Genitourinary: Negative for urgency, hematuria, flank pain, discharge, difficulty urinating and genital sores.  Musculoskeletal: Negative for arthralgias, back pain, gait problem, joint swelling, myalgias and neck stiffness.  Skin: Negative for rash.  Neurological: Positive for weakness. Negative  for dizziness, syncope, speech difficulty, numbness and headaches.  Hematological: Negative for adenopathy. Does not bruise/bleed easily.  Psychiatric/Behavioral: Negative for behavioral problems and dysphoric mood. The patient is not nervous/anxious.        Objective:   Physical Exam  Constitutional: He is oriented to person, place, and time. He appears well-developed.  HENT:  Head: Normocephalic.  Right Ear: External ear normal.  Left Ear: External ear normal.  Left temporal artery appeared normal  Eyes: Conjunctivae and EOM are normal.  Neck:  Normal range of motion.  Cardiovascular: Normal rate and normal heart sounds.   Pulmonary/Chest:  A few crackles right base  Abdominal: Bowel sounds are normal.  Musculoskeletal: Normal range of motion. He exhibits no edema and no tenderness.  Neurological: He is alert and oriented to person, place, and time.  Psychiatric: He has a normal mood and affect. His behavior is normal.          Assessment & Plan:  Fatigue. This has been intermittent and chronic problem in the past. We'll check a sed rate Hypertension CAD Paroxysmal atrial fibrillation

## 2013-07-31 ENCOUNTER — Ambulatory Visit (INDEPENDENT_AMBULATORY_CARE_PROVIDER_SITE_OTHER): Payer: Medicare Other | Admitting: *Deleted

## 2013-07-31 DIAGNOSIS — I4891 Unspecified atrial fibrillation: Secondary | ICD-10-CM | POA: Diagnosis not present

## 2013-07-31 DIAGNOSIS — Z5181 Encounter for therapeutic drug level monitoring: Secondary | ICD-10-CM

## 2013-07-31 DIAGNOSIS — Z7901 Long term (current) use of anticoagulants: Secondary | ICD-10-CM

## 2013-07-31 LAB — POCT INR: INR: 2.9

## 2013-07-31 MED ORDER — WARFARIN SODIUM 5 MG PO TABS: 5.0000 mg | ORAL_TABLET | Freq: Every evening | ORAL | Status: DC

## 2013-08-22 ENCOUNTER — Telehealth: Payer: Self-pay | Admitting: Internal Medicine

## 2013-08-22 ENCOUNTER — Encounter: Payer: Self-pay | Admitting: Internal Medicine

## 2013-08-22 ENCOUNTER — Ambulatory Visit (INDEPENDENT_AMBULATORY_CARE_PROVIDER_SITE_OTHER): Payer: Medicare Other | Admitting: Internal Medicine

## 2013-08-22 VITALS — BP 118/66 | HR 84 | Temp 98.1°F | Resp 20 | Ht 68.5 in | Wt 189.0 lb

## 2013-08-22 DIAGNOSIS — I4891 Unspecified atrial fibrillation: Secondary | ICD-10-CM | POA: Diagnosis not present

## 2013-08-22 DIAGNOSIS — R112 Nausea with vomiting, unspecified: Secondary | ICD-10-CM

## 2013-08-22 MED ORDER — PROMETHAZINE HCL 50 MG/ML IJ SOLN
50.0000 mg | Freq: Once | INTRAMUSCULAR | Status: AC
  Administered 2013-08-22: 50 mg via INTRAMUSCULAR

## 2013-08-22 NOTE — Patient Instructions (Signed)
The main concern with gastroenteritis is dehydration.  Drink  plenty of  fluids, and advance your diet  slowly to solids as you feel improved.  If you are unable to keep anything down or  Show  signs of dehydration such as dry, cracked lips, or  not urinating, please notify our office.  Norovirus Infection Norovirus illness is caused by a viral infection. The term norovirus refers to a group of viruses. Any of those viruses can cause norovirus illness. This illness is often referred to by other names such as viral gastroenteritis, stomach flu, and food poisoning. Anyone can get a norovirus infection. People can have the illness multiple times during their lifetime. CAUSES  Norovirus is found in the stool or vomit of infected people. It is easily spread from person to person (contagious). People with norovirus are contagious from the moment they begin feeling ill. They may remain contagious for as long as 3 days to 2 weeks after recovery. People can become infected with the virus in several ways. This includes:  Eating food or drinking liquids that are contaminated with norovirus.  Touching surfaces or objects contaminated with norovirus, and then placing your hand in your mouth.  Having direct contact with a person who is infected and shows symptoms. This may occur while caring for someone with illness or while sharing foods or eating utensils with someone who is ill. SYMPTOMS  Symptoms usually begin 1 to 2 days after ingestion of the virus. Symptoms may include:  Nausea.  Vomiting.  Diarrhea.  Stomach cramps.  Low-grade fever.  Chills.  Headache.  Muscle aches.  Tiredness. Most people with norovirus illness get better within 1 to 2 days. Some people become dehydrated because they cannot drink enough liquids to replace those lost from vomiting and diarrhea. This is especially true for young children, the elderly, and others who are unable to care for themselves. DIAGNOSIS   Diagnosis is based on your symptoms and exam. Currently, only state public health laboratories have the ability to test for norovirus in stool or vomit. TREATMENT  No specific treatment exists for norovirus infections. No vaccine is available to prevent infections. Norovirus illness is usually brief in healthy people. If you are ill with vomiting and diarrhea, you should drink enough water and fluids to keep your urine clear or pale yellow. Dehydration is the most serious health effect that can result from this infection. By drinking oral rehydration solution (ORS), people can reduce their chance of becoming dehydrated. There are many commercially available pre-made and powdered ORS designed to safely rehydrate people. These may be recommended by your caregiver. Replace any new fluid losses from diarrhea or vomiting with ORS as follows:  If your child weighs 10 kg or less (22 lb or less), give 60 to 120 ml ( to  cup or 2 to 4 oz) of ORS for each diarrheal stool or vomiting episode.  If your child weighs more than 10 kg (more than 22 lb), give 120 to 240 ml ( to 1 cup or 4 to 8 oz) of ORS for each diarrheal stool or vomiting episode. HOME CARE INSTRUCTIONS   Follow all your caregiver's instructions.  Avoid sugar-free and alcoholic drinks while ill.  Only take over-the-counter or prescription medicines for pain, vomiting, diarrhea, or fever as directed by your caregiver. You can decrease your chances of coming in contact with norovirus or spreading it by following these steps:  Frequently wash your hands, especially after using the toilet, changing diapers, and  before eating or preparing food.  Carefully wash fruits and vegetables. Cook shellfish before eating them.  Do not prepare food for others while you are infected and for at least 3 days after recovering from illness.  Thoroughly clean and disinfect contaminated surfaces immediately after an episode of illness using a bleach-based  household cleaner.  Immediately remove and wash clothing or linens that may be contaminated with the virus.  Use the toilet to dispose of any vomit or stool. Make sure the surrounding area is kept clean.  Food that may have been contaminated by an ill person should be discarded. SEEK IMMEDIATE MEDICAL CARE IF:   You develop symptoms of dehydration that do not improve with fluid replacement. This may include:  Excessive sleepiness.  Lack of tears.  Dry mouth.  Dizziness when standing.  Weak pulse. Document Released: 09/23/2002 Document Revised: 09/25/2011 Document Reviewed: 10/25/2009 [facility] Patient Information 2014 [place], [place].

## 2013-08-22 NOTE — Telephone Encounter (Signed)
Patient Information:  Caller Name: Estill Bamberg  Phone: [phone]  Patient: [name], [name]  Gender: Male  DOB: 12-Nov-1926  Age: 80 Years  PCP: Bluford Kaufmann (Family Practice > 80yrs old)  Office Follow Up:  Does the office need to follow up with this patient?: No  Instructions For The Office: N/A  RN Note:  Onset 0500 08/22/13 of vomiting with diarrhea.  Afebrile.  Episodes x 3.  States feels very weak, but is not dizzy.  Per vomiting protocol, disposition See in ED; TC to offic.  Per Curt Bears, ok to schedule appt; appt scheduled with Dr. Burnice Logan 1430 08/22/13.  krs/can  Symptoms  Reason For Call & Symptoms: vomiting, diarrhea  Reviewed Health History In EMR: Yes  Reviewed Medications In EMR: Yes  Reviewed Allergies In EMR: Yes  Reviewed Surgeries / Procedures: Yes  Date of Onset of Symptoms: 08/22/2013  Guideline(s) Used:  Vomiting  Disposition Per Guideline:   Go to ED Now  Reason For Disposition Reached:   Moderate vomiting (e.g., 3 - 5 times/day) and age > 26  Advice Given:  N/A  Patient Will Follow Care Advice:  YES

## 2013-08-22 NOTE — Progress Notes (Signed)
Pre-visit discussion using our clinic review tool. No additional management support is needed unless otherwise documented below in the visit note.  

## 2013-08-22 NOTE — Progress Notes (Signed)
   Subjective:    Patient ID: [name], male    DOB: 14-Feb-1927, 80 y.o.   MRN: [id number]  HPI  Wt Readings from Last 3 Encounters:  08/22/13 189 lb (85.73 kg)  07/16/13 197 lb (89.359 kg)  07/03/13 192 lb (87.091 kg)    Review of Systems     Objective:   Physical Exam        Assessment & Plan:

## 2013-08-22 NOTE — Progress Notes (Signed)
Subjective:    Patient ID: [name], male    DOB: 1927-05-13, 80 y.o.   MRN: [id number]  HPI  80 year old patient who presents with a abrupt onset of nausea vomiting diarrhea that started about 5 AM this morning;  he has been quite weak.  He has some mild nausea but no vomiting or diarrhea in the past several hours. He has been able to tolerate clear liquids. No significant abdominal pain. No other family members or contacts with similar symptoms. No fever  Past Medical History  Diagnosis Date  . HYPOTHYROIDISM 05/20/2007  . HYPERLIPIDEMIA 10/20/2008  . DEPRESSION 12/17/2007  . MITRAL REGURGITATION 10/20/2008  . HYPERTENSION 04/10/2007  . CORONARY ARTERY DISEASE     a. s/p stent OM 2002. b. s/p BMS 2006 RCA  c. s/p cath 2007.  [name] GERD 04/10/2007  . DIVERTICULOSIS, COLON 04/10/2007  . BENIGN PROSTATIC HYPERTROPHY 04/10/2007  . GYNECOMASTIA, UNILATERAL 03/23/2010  . SLEEP APNEA 05/20/2007  . WEAKNESS 11/09/2009  . Hematoma of abdominal wall   . Clotting disorder     anticoag. therapy  . Mallory - Weiss tear     > 5 years ago  . Barrett's esophagus   . Popliteal artery embolism, right   . History of echocardiogram     a. EF 55% (TEE 08/2011)  . Paroxysmal a-fib     a. chronic amiodarone and coumadin;  b. 05/2013 s/p DCCV.    History   Social History  . Marital Status: Married    Spouse Name: N/A    Number of Children: 1  . Years of Education: N/A   Occupational History  . retired    Social History Main Topics  . Smoking status: Never Smoker   . Smokeless tobacco: Never Used  . Alcohol Use: No  . Drug Use: No  . Sexual Activity: Not on file   Other Topics Concern  . Not on file   Social History Narrative   He is retired from Landscape [employer].   Mother lived into her 43's.  Father died in his 44's.  No CAD.    Past Surgical History  Procedure Laterality Date  . Transurethral resection of prostate    . Coronary angioplasty with stent placement    . Tee with  cardioversion  7/08  . Cardioversion  8/08  . Esophagogastroduodenoscopy    . Tee without cardioversion  09/14/2011    Procedure: TRANSESOPHAGEAL ECHOCARDIOGRAM (TEE);  Surgeon: Josue Hector, MD;  Location: [facility] ENDOSCOPY;  Service: Cardiovascular;  Laterality: N/A;  . Cardioversion  09/14/2011    Procedure: CARDIOVERSION;  Surgeon: Josue Hector, MD;  Location: [facility] ENDOSCOPY;  Service: Cardiovascular;  Laterality: N/A;    Family History  Problem Relation Age of Onset  . Hypertension Other   . Heart disease Brother     CAD male 1st degree relative    No Known Allergies  Current Outpatient Prescriptions on File Prior to Visit  Medication Sig Dispense Refill  . amiodarone (PACERONE) 200 MG tablet Take 1 tablet (200 mg total) by mouth daily.      [name] amLODipine (NORVASC) 10 MG tablet Take 2.5 mg by mouth daily. Depends on BP      . Cholecalciferol (VITAMIN D-3 PO) Take 7,000 Units by mouth daily.      . clonazePAM (KLONOPIN) 1 MG tablet Take 1 mg by mouth at bedtime.        . Coenzyme Q10 (CO Q 10) 100 MG CAPS Take 1 capsule  by mouth daily.        [name] DIGESTIVE ENZYMES PO Take 1 tablet by mouth every evening.       . fish oil-omega-3 fatty acids 1000 MG capsule Take 3 g by mouth daily.      [name] levothyroxine (SYNTHROID, LEVOTHROID) 25 MCG tablet Take 25 mcg by mouth daily.       . Multiple Vitamin (MULTIVITAMIN) capsule Take 1 capsule by mouth daily.      . nitroGLYCERIN (NITROSTAT) 0.4 MG SL tablet Place 0.4 mg under the tongue every 5 (five) minutes as needed for chest pain.       . pantoprazole (PROTONIX) 40 MG tablet Take 40 mg by mouth 2 (two) times daily.        . pravastatin (PRAVACHOL) 40 MG tablet Take 40 mg by mouth every evening.      [name] PRESCRIPTION MEDICATION Place 1 drop into both eyes daily as needed (dry eyes).      . traZODone (DESYREL) 50 MG tablet Take 25 mg by mouth at bedtime. 1/2 pill at bedtime      . warfarin (COUMADIN) 5 MG tablet Take 1 tablet (5 mg total) by mouth  every evening.  90 tablet  3   No current facility-administered medications on file prior to visit.    BP 118/66  Pulse 84  Temp(Src) 98.1 F (36.7 C) (Oral)  Resp 20  Ht 5' 8.5" (1.74 m)  Wt 189 lb (85.73 kg)  BMI 28.32 kg/m2  SpO2 98%      Review of Systems  Constitutional: Positive for fatigue. Negative for fever, chills and appetite change.  HENT: Negative for congestion, dental problem, ear pain, hearing loss, sore throat, tinnitus, trouble swallowing and voice change.   Eyes: Negative for pain, discharge and visual disturbance.  Respiratory: Negative for cough, chest tightness, wheezing and stridor.   Cardiovascular: Negative for chest pain, palpitations and leg swelling.  Gastrointestinal: Positive for nausea, vomiting and diarrhea. Negative for abdominal pain, constipation, blood in stool and abdominal distention.  Genitourinary: Negative for urgency, hematuria, flank pain, discharge, difficulty urinating and genital sores.  Musculoskeletal: Negative for arthralgias, back pain, gait problem, joint swelling, myalgias and neck stiffness.  Skin: Negative for rash.  Neurological: Positive for weakness. Negative for dizziness, syncope, speech difficulty, numbness and headaches.  Hematological: Negative for adenopathy. Does not bruise/bleed easily.  Psychiatric/Behavioral: Negative for behavioral problems and dysphoric mood. The patient is not nervous/anxious.        Objective:   Physical Exam  Constitutional: He is oriented to person, place, and time. He appears well-developed.  Blood pressure 110/70 Pulse rate 70  HENT:  Head: Normocephalic.  Right Ear: External ear normal.  Left Ear: External ear normal.  Eyes: Conjunctivae and EOM are normal.  Neck: Normal range of motion.  Cardiovascular: Normal rate and normal heart sounds.   Pulmonary/Chest: Breath sounds normal.  Abdominal: Soft. Bowel sounds are normal. He exhibits no distension and no mass. There is no  tenderness. There is no rebound and no guarding.  No surgical scars  Musculoskeletal: Normal range of motion. He exhibits no edema and no tenderness.  Neurological: He is alert and oriented to person, place, and time.  Psychiatric: He has a normal mood and affect. His behavior is normal.          Assessment & Plan:   Viral gastroenteritis. Patient seems improved without vomiting or diarrhea in several hours. We'll treat with Phenergan and observe. He was maintained on  clear liquid diet and slowly advance We'll call if any worsening or new symptoms  Chronic atrial fibrillation

## 2013-09-10 ENCOUNTER — Ambulatory Visit (INDEPENDENT_AMBULATORY_CARE_PROVIDER_SITE_OTHER): Payer: Medicare Other | Admitting: *Deleted

## 2013-09-10 DIAGNOSIS — Z5181 Encounter for therapeutic drug level monitoring: Secondary | ICD-10-CM | POA: Diagnosis not present

## 2013-09-10 DIAGNOSIS — Z7901 Long term (current) use of anticoagulants: Secondary | ICD-10-CM

## 2013-09-10 DIAGNOSIS — I4891 Unspecified atrial fibrillation: Secondary | ICD-10-CM

## 2013-09-10 LAB — POCT INR: INR: 2.4

## 2013-09-17 DIAGNOSIS — N401 Enlarged prostate with lower urinary tract symptoms: Secondary | ICD-10-CM | POA: Diagnosis not present

## 2013-09-17 DIAGNOSIS — N139 Obstructive and reflux uropathy, unspecified: Secondary | ICD-10-CM | POA: Diagnosis not present

## 2013-09-29 ENCOUNTER — Encounter: Payer: Self-pay | Admitting: Cardiology

## 2013-09-29 ENCOUNTER — Ambulatory Visit (INDEPENDENT_AMBULATORY_CARE_PROVIDER_SITE_OTHER): Payer: Medicare Other | Admitting: Cardiology

## 2013-09-29 ENCOUNTER — Ambulatory Visit: Payer: Medicare Other | Admitting: Cardiology

## 2013-09-29 VITALS — BP 122/72 | HR 63 | Ht 68.5 in | Wt 193.8 lb

## 2013-09-29 DIAGNOSIS — I4891 Unspecified atrial fibrillation: Secondary | ICD-10-CM | POA: Diagnosis not present

## 2013-09-29 DIAGNOSIS — I10 Essential (primary) hypertension: Secondary | ICD-10-CM

## 2013-09-29 DIAGNOSIS — I2510 Atherosclerotic heart disease of native coronary artery without angina pectoris: Secondary | ICD-10-CM | POA: Diagnosis not present

## 2013-09-29 DIAGNOSIS — I480 Paroxysmal atrial fibrillation: Secondary | ICD-10-CM

## 2013-09-29 NOTE — Progress Notes (Signed)
HPI The patient presents for followup after recent hospitalization for atrial fibrillation. He has been maintaining NSR since his last cardioversion.  Since that time he remains very tired.  The patient denies any new symptoms such as chest discomfort, neck or arm discomfort. There has been no new shortness of breath, PND or orthopnea.  He is unsteady on his feet and walks with a cane.  I did review labs done at the [place] recently and his liver enzymes and TSH were normal.  He was mildly anemic.  No Known Allergies  Current Outpatient Prescriptions  Medication Sig Dispense Refill  . amiodarone (PACERONE) 200 MG tablet Take 1 tablet (200 mg total) by mouth daily.      [name] amLODipine (NORVASC) 10 MG tablet Take 2.5 mg by mouth daily. Depends on BP      . Cholecalciferol (VITAMIN D-3 PO) Take 7,000 Units by mouth daily.      . clonazePAM (KLONOPIN) 1 MG tablet Take 1 mg by mouth at bedtime.        . Coenzyme Q10 (CO Q 10) 100 MG CAPS Take 1 capsule by mouth daily.        [name] DIGESTIVE ENZYMES PO Take 1 tablet by mouth every evening.       . fish oil-omega-3 fatty acids 1000 MG capsule Take 3 g by mouth daily.      [name] levothyroxine (SYNTHROID, LEVOTHROID) 25 MCG tablet Take 25 mcg by mouth daily.       . Multiple Vitamin (MULTIVITAMIN) capsule Take 1 capsule by mouth daily.      . nitroGLYCERIN (NITROSTAT) 0.4 MG SL tablet Place 0.4 mg under the tongue every 5 (five) minutes as needed for chest pain.       . pantoprazole (PROTONIX) 40 MG tablet Take 40 mg by mouth 2 (two) times daily.        . pravastatin (PRAVACHOL) 40 MG tablet Take 40 mg by mouth every evening.      [name] PRESCRIPTION MEDICATION Place 1 drop into both eyes daily as needed (dry eyes).      . traZODone (DESYREL) 50 MG tablet Take 25 mg by mouth at bedtime. 1/2 pill at bedtime      . warfarin (COUMADIN) 5 MG tablet Take 1 tablet (5 mg total) by mouth every evening.  90 tablet  3   No current facility-administered medications for this visit.     Past Medical History  Diagnosis Date  . HYPOTHYROIDISM 05/20/2007  . HYPERLIPIDEMIA 10/20/2008  . DEPRESSION 12/17/2007  . MITRAL REGURGITATION 10/20/2008  . HYPERTENSION 04/10/2007  . CORONARY ARTERY DISEASE     a. s/p stent OM 2002. b. s/p BMS 2006 RCA  c. s/p cath 2007.  [name] GERD 04/10/2007  . DIVERTICULOSIS, COLON 04/10/2007  . BENIGN PROSTATIC HYPERTROPHY 04/10/2007  . GYNECOMASTIA, UNILATERAL 03/23/2010  . SLEEP APNEA 05/20/2007  . WEAKNESS 11/09/2009  . Hematoma of abdominal wall   . Clotting disorder     anticoag. therapy  . Mallory - Weiss tear     > 5 years ago  . Barrett's esophagus   . Popliteal artery embolism, right   . History of echocardiogram     a. EF 55% (TEE 08/2011)  . Paroxysmal a-fib     a. chronic amiodarone and coumadin;  b. 05/2013 s/p DCCV.    Past Surgical History  Procedure Laterality Date  . Transurethral resection of prostate    . Coronary angioplasty with stent placement    .  Tee with cardioversion  7/08  . Cardioversion  8/08  . Esophagogastroduodenoscopy    . Tee without cardioversion  09/14/2011    Procedure: TRANSESOPHAGEAL ECHOCARDIOGRAM (TEE);  Surgeon: Josue Hector, MD;  Location: [facility] ENDOSCOPY;  Service: Cardiovascular;  Laterality: N/A;  . Cardioversion  09/14/2011    Procedure: CARDIOVERSION;  Surgeon: Josue Hector, MD;  Location: [facility] ENDOSCOPY;  Service: Cardiovascular;  Laterality: N/A;    ROS:  As stated in the HPI and negative for all other systems.  PHYSICAL EXAM BP 122/72  Pulse 63  Ht 5' 8.5" (1.74 m)  Wt 193 lb 12.8 oz (87.907 kg)  BMI 29.04 kg/m2 GENERAL: No acute distress NECK:  No jugular venous distention, waveform within normal limits, carotid upstroke brisk and symmetric, no bruits, no thyromegaly LYMPHATICS:  No cervical, inguinal adenopathy LUNGS:  Clear to auscultation bilaterally BACK:  No CVA tenderness CHEST:  Unremarkable HEART:  PMI not displaced or sustained,S1 and S2 within normal limits, no S3, no S4, no  clicks, no rubs, apical and axillary late systolic murmur ABD:  Flat, positive bowel sounds normal in frequency in pitch, no bruits, no rebound, no guarding, no midline pulsatile mass, no hepatomegaly, no splenomegaly EXT:  2 plus pulses throughout, no edema, no cyanosis no clubbing PSYCH:  Continued flat affect  EKG: Sinus rhythm, rate 63, incomplete right bundle branch block,  no acute ST-T wave changes.  09/29/2013  ASSESSMENT AND PLAN  ATRIAL FIBRILLATION - He is maintaining NSR.  He will remain on the meds as listed.  He is up to date with lab follow up.   CORONARY ARTERY DISEASE -  The patient has no new sypmtoms.  No further cardiovascular testing is indicated.  We will continue with aggressive risk reduction and meds as listed.  HYPERTENSION -  He takes Norvasc now only if his BP is elevated.  No change in therapy is indicated.   MITRAL REGURGITATION -  He has had only mild mitral regurgitation and I would not expect this to be different.  No change in therapy is indicated.   FATIGUE - He has had this as a chronic problem. His labs are stable including a recent TSH that was normal. No change in therapy or further evaluation is indicated.

## 2013-09-29 NOTE — Patient Instructions (Signed)
The current medical regimen is effective;  continue present plan and medications.  Follow up in 6 months with Dr Hochrein.  You will receive a letter in the mail 2 months before you are due.  Please call us when you receive this letter to schedule your follow up appointment.  

## 2013-10-10 ENCOUNTER — Encounter: Payer: Self-pay | Admitting: Pulmonary Disease

## 2013-10-10 ENCOUNTER — Ambulatory Visit (INDEPENDENT_AMBULATORY_CARE_PROVIDER_SITE_OTHER): Payer: Medicare Other | Admitting: Pulmonary Disease

## 2013-10-10 VITALS — BP 134/88 | HR 60 | Temp 97.2°F | Ht 71.0 in | Wt 196.0 lb

## 2013-10-10 DIAGNOSIS — IMO0002 Reserved for concepts with insufficient information to code with codable children: Secondary | ICD-10-CM

## 2013-10-10 DIAGNOSIS — G4733 Obstructive sleep apnea (adult) (pediatric): Secondary | ICD-10-CM

## 2013-10-10 DIAGNOSIS — G4750 Parasomnia, unspecified: Secondary | ICD-10-CM

## 2013-10-10 DIAGNOSIS — G4700 Insomnia, unspecified: Secondary | ICD-10-CM

## 2013-10-10 DIAGNOSIS — I2510 Atherosclerotic heart disease of native coronary artery without angina pectoris: Secondary | ICD-10-CM

## 2013-10-10 MED ORDER — TRAZODONE HCL 50 MG PO TABS: ORAL_TABLET | ORAL | Status: DC

## 2013-10-10 NOTE — Progress Notes (Signed)
Chief Complaint  Patient presents with  . Follow-up    Pt states he is not sleeping well, pt feels like he is "choking" at times during the night while wearing machine. Wearing CPAP nightly for 6 hours. Denies pressure and machine issues.      History of Present Illness: [name] is a 80 y.o. male with NREM parasomnia and OSA.  He has been using his CPAP.  This helps his sleep.  He still has trouble falling asleep.  He is using trazodone 50 mg before bedtime.  He continues to use clonazepam 1 mg before bedtime.  He received a letter from medicare denying payment for his CPAP.  It is unclear why this has been denied.   TESTS: PSG 12/08/05 >> AHI 0.8, SpO2 low 85%, UARS, Central events with sleep onset PSG 09/26/06 >> AHI 0.8, SpO2 low 89%, UARS, Central events with sleep onset ASV titration 01/07/07 PSG 11/14/12 >> AHI 3, SpO2 low 89%, PLMI 0.  Reduced sleep time, no R or S sleep PSG 12/12/12 >> AHI 8.2, SpO2 low 88%, Supine AHI 25.4. BiPAP 11/13/12 to 11/21/12 >> Used on 9 of 9 nights with average 7 hrs 6 min.  Median IPAP 12 cm H2O, 95 th percentile IPAP 16 cm H2O.  Median EPAP 7 cm H2O, 95 th percentile EPAP 7 cm H2O. CPAP 01/02/13 to 03/02/13 >> Used on 59 of 60 nights with average 6 hrs 47 min. Average AHI 0.8 with median PAP 14 cm H2O and 95 th percentile PAP 15 cm H2O.  [name]  has a past medical history of HYPOTHYROIDISM (05/20/2007); HYPERLIPIDEMIA (10/20/2008); DEPRESSION (12/17/2007); MITRAL REGURGITATION (10/20/2008); HYPERTENSION (04/10/2007); CORONARY ARTERY DISEASE; GERD (04/10/2007); DIVERTICULOSIS, COLON (04/10/2007); BENIGN PROSTATIC HYPERTROPHY (04/10/2007); GYNECOMASTIA, UNILATERAL (03/23/2010); SLEEP APNEA (05/20/2007); WEAKNESS (11/09/2009); Hematoma of abdominal wall; Clotting disorder; Mallory - Weiss tear; Barrett's esophagus; Popliteal artery embolism, right; History of echocardiogram; and Paroxysmal a-fib.  [name]  has past surgical history that includes  Transurethral resection of prostate; Coronary angioplasty with stent; TEE with cardioversion (7/08); cardioversion (8/08); Esophagogastroduodenoscopy; TEE without cardioversion (09/14/2011); and Cardioversion (09/14/2011).  Prior to Admission medications   Medication Sig Start Date End Date Taking? Authorizing Provider  amiodarone (PACERONE) 200 MG tablet Take 1 tablet (200 mg total) by mouth daily. 11/03/11 11/04/12 Yes Minus Breeding, MD  amLODipine (NORVASC) 10 MG tablet Take 5 mg by mouth daily as needed. Depends on BP   Yes Historical Provider, MD  Cholecalciferol (VITAMIN D-3 PO) Take 7,000 Units by mouth daily.   Yes Historical Provider, MD  clonazePAM (KLONOPIN) 1 MG tablet Take 1 mg by mouth at bedtime.     Yes Historical Provider, MD  Coenzyme Q10 (CO Q 10) 100 MG CAPS Take 1 capsule by mouth daily.     Yes Historical Provider, MD  DIGESTIVE ENZYMES PO Take by mouth daily.   Yes Historical Provider, MD  fish oil-omega-3 fatty acids 1000 MG capsule Take 3 g by mouth daily.   Yes Historical Provider, MD  levothyroxine (SYNTHROID, LEVOTHROID) 25 MCG tablet Take 25 mcg by mouth daily.    Yes Historical Provider, MD  Multiple Vitamin (MULTIVITAMIN) capsule Take 1 capsule by mouth daily.   Yes Historical Provider, MD  nitroGLYCERIN (NITROSTAT) 0.4 MG SL tablet Place 0.4 mg under the tongue every 5 (five) minutes as needed.     Yes Historical Provider, MD  pantoprazole (PROTONIX) 40 MG tablet Take 40 mg by mouth 2 (two) times daily.  Yes Historical Provider, MD  pravastatin (PRAVACHOL) 40 MG tablet Take 40 mg by mouth every evening. 03/07/11  Yes Imogene Burn, PA-C  Prenatal Vit-Fe Fumarate-FA (M-VIT) tablet Take 1 tablet by mouth daily.     Yes Historical Provider, MD  traZODone (DESYREL) 50 MG tablet Take 150 mg by mouth at bedtime. 1/2 pill at bedtime   Yes Historical Provider, MD  warfarin (COUMADIN) 5 MG tablet Take per coumadin clinic instructions, dose changes according to INR, this is a  90 supply 10/09/12  Yes Minus Breeding, MD    No Known Allergies   Physical Exam:  General - No distress ENT - No sinus tenderness, no oral exudate, no LAN Cardiac - s1s2 regular, no murmur Chest - No wheeze/rales/dullness Back - No focal tenderness Abd - Soft, non-tender Ext - No edema Neuro - Normal strength Skin - No rashes Psych - normal mood, and behavior   Assessment/Plan:  Chesley Mires, MD [place] [facility] Pager:  [phone]

## 2013-10-10 NOTE — Assessment & Plan Note (Signed)
He has a NREM parasomnia controlled on [facility] 

## 2013-10-10 NOTE — Patient Instructions (Signed)
Will have our coordinators check with [place] about CPAP payments from Medicare Try using 100 mg of trazodone at night to help with falling sleep Follow up in 6 months

## 2013-10-10 NOTE — Addendum Note (Signed)
Addended by: Len Blalock on: 10/10/2013 02:25 PM   Modules accepted: Orders

## 2013-10-10 NOTE — Assessment & Plan Note (Signed)
He is compliant with CPAP and reports benefit.  It is unclear to me why medicare has denied payment for his CPAP.  Will have him d/w our coordinators and discuss with [facility].

## 2013-10-10 NOTE — Assessment & Plan Note (Signed)
Advised him to try increasing trazodone to 100 mg qhs.

## 2013-10-14 ENCOUNTER — Encounter: Payer: Self-pay | Admitting: Internal Medicine

## 2013-10-14 ENCOUNTER — Ambulatory Visit (INDEPENDENT_AMBULATORY_CARE_PROVIDER_SITE_OTHER): Payer: Medicare Other | Admitting: Internal Medicine

## 2013-10-14 VITALS — BP 140/84 | HR 68 | Temp 98.4°F | Resp 20 | Ht 71.0 in | Wt 200.0 lb

## 2013-10-14 DIAGNOSIS — I4891 Unspecified atrial fibrillation: Secondary | ICD-10-CM | POA: Diagnosis not present

## 2013-10-14 DIAGNOSIS — E039 Hypothyroidism, unspecified: Secondary | ICD-10-CM | POA: Diagnosis not present

## 2013-10-14 DIAGNOSIS — I2510 Atherosclerotic heart disease of native coronary artery without angina pectoris: Secondary | ICD-10-CM | POA: Diagnosis not present

## 2013-10-14 DIAGNOSIS — I480 Paroxysmal atrial fibrillation: Secondary | ICD-10-CM

## 2013-10-14 DIAGNOSIS — IMO0002 Reserved for concepts with insufficient information to code with codable children: Secondary | ICD-10-CM | POA: Diagnosis not present

## 2013-10-14 DIAGNOSIS — G4750 Parasomnia, unspecified: Secondary | ICD-10-CM

## 2013-10-14 DIAGNOSIS — I10 Essential (primary) hypertension: Secondary | ICD-10-CM | POA: Diagnosis not present

## 2013-10-14 DIAGNOSIS — E785 Hyperlipidemia, unspecified: Secondary | ICD-10-CM

## 2013-10-14 NOTE — Patient Instructions (Signed)
Limit your sodium (Salt) intake  Return in 6 months for follow-up  

## 2013-10-14 NOTE — Progress Notes (Signed)
Subjective:    Patient ID: [name], male    DOB: 30-Jan-1927, 80 y.o.   MRN: [id number]  HPI   80 year old patient who has a history of paroxysmal atrial for ablation CAD, hypertension, and dyslipidemia.  He is treated hypothyroidism.  He is followed closely by cardiology and pulmonary medicine.  He also has a history of OSA.  He remains on chronic Coumadin anticoagulation.  In general doing reasonably well.  No focal concerns or complaints.  Past Medical History  Diagnosis Date  . HYPOTHYROIDISM 05/20/2007  . HYPERLIPIDEMIA 10/20/2008  . DEPRESSION 12/17/2007  . MITRAL REGURGITATION 10/20/2008  . HYPERTENSION 04/10/2007  . CORONARY ARTERY DISEASE     a. s/p stent OM 2002. b. s/p BMS 2006 RCA  c. s/p cath 2007.  [name] GERD 04/10/2007  . DIVERTICULOSIS, COLON 04/10/2007  . BENIGN PROSTATIC HYPERTROPHY 04/10/2007  . GYNECOMASTIA, UNILATERAL 03/23/2010  . SLEEP APNEA 05/20/2007  . WEAKNESS 11/09/2009  . Hematoma of abdominal wall   . Clotting disorder     anticoag. therapy  . Mallory - Weiss tear     > 5 years ago  . Barrett's esophagus   . Popliteal artery embolism, right   . History of echocardiogram     a. EF 55% (TEE 08/2011)  . Paroxysmal a-fib     a. chronic amiodarone and coumadin;  b. 05/2013 s/p DCCV.    History   Social History  . Marital Status: Married    Spouse Name: N/A    Number of Children: 1  . Years of Education: N/A   Occupational History  . retired    Social History Main Topics  . Smoking status: Never Smoker   . Smokeless tobacco: Never Used  . Alcohol Use: No  . Drug Use: No  . Sexual Activity: Not on file   Other Topics Concern  . Not on file   Social History Narrative   He is retired from Landscape architect.   Mother lived into her 52's.  Father died in his 34's.  No CAD.    Past Surgical History  Procedure Laterality Date  . Transurethral resection of prostate    . Coronary angioplasty with stent placement    . Tee with cardioversion  7/08  .  Cardioversion  8/08  . Esophagogastroduodenoscopy    . Tee without cardioversion  09/14/2011    Procedure: TRANSESOPHAGEAL ECHOCARDIOGRAM (TEE);  Surgeon: Josue Hector, MD;  Location: [facility] ENDOSCOPY;  Service: Cardiovascular;  Laterality: N/A;  . Cardioversion  09/14/2011    Procedure: CARDIOVERSION;  Surgeon: Josue Hector, MD;  Location: [facility] ENDOSCOPY;  Service: Cardiovascular;  Laterality: N/A;    Family History  Problem Relation Age of Onset  . Hypertension Other   . Heart disease Brother     CAD male 1st degree relative    No Known Allergies  Current Outpatient Prescriptions on File Prior to Visit  Medication Sig Dispense Refill  . amiodarone (PACERONE) 200 MG tablet Take 1 tablet (200 mg total) by mouth daily.      [name] amLODipine (NORVASC) 10 MG tablet Take 2.5 mg by mouth daily. Depends on BP      . Cholecalciferol (VITAMIN D-3 PO) Take 7,000 Units by mouth daily.      . clonazePAM (KLONOPIN) 1 MG tablet Take 1 mg by mouth at bedtime.        . Coenzyme Q10 (CO Q 10) 100 MG CAPS Take 1 capsule by mouth daily.        [name]  DIGESTIVE ENZYMES PO Take 1 tablet by mouth every evening.       . fish oil-omega-3 fatty acids 1000 MG capsule Take 3 g by mouth daily.      [name] levothyroxine (SYNTHROID, LEVOTHROID) 25 MCG tablet Take 25 mcg by mouth daily.       . Multiple Vitamin (MULTIVITAMIN) capsule Take 1 capsule by mouth daily.      . nitroGLYCERIN (NITROSTAT) 0.4 MG SL tablet Place 0.4 mg under the tongue every 5 (five) minutes as needed for chest pain.       . pantoprazole (PROTONIX) 40 MG tablet Take 40 mg by mouth daily.       . pravastatin (PRAVACHOL) 40 MG tablet Take 40 mg by mouth every evening.      [name] PRESCRIPTION MEDICATION Place 1 drop into both eyes daily as needed (dry eyes).      . traZODone (DESYREL) 50 MG tablet Take 1 to 2 pills at night as needed for sleep      . warfarin (COUMADIN) 5 MG tablet Take 1 tablet (5 mg total) by mouth every evening.  90 tablet  3   No current  facility-administered medications on file prior to visit.    BP 140/84  Pulse 68  Temp(Src) 98.4 F (36.9 C) (Oral)  Resp 20  Ht 5\' 11"  (1.803 m)  Wt 200 lb (90.719 kg)  BMI 27.91 kg/m2  SpO2 98%       Review of Systems  Constitutional: Positive for fatigue. Negative for fever, chills and appetite change.  HENT: Negative for congestion, dental problem, ear pain, hearing loss, sore throat, tinnitus, trouble swallowing and voice change.   Eyes: Negative for pain, discharge and visual disturbance.  Respiratory: Negative for cough, chest tightness, wheezing and stridor.   Cardiovascular: Negative for chest pain, palpitations and leg swelling.  Gastrointestinal: Negative for nausea, vomiting, abdominal pain, diarrhea, constipation, blood in stool and abdominal distention.  Genitourinary: Negative for urgency, hematuria, flank pain, discharge, difficulty urinating and genital sores.  Musculoskeletal: Negative for arthralgias, back pain, gait problem, joint swelling, myalgias and neck stiffness.  Skin: Negative for rash.  Neurological: Negative for dizziness, syncope, speech difficulty, weakness, numbness and headaches.  Hematological: Negative for adenopathy. Does not bruise/bleed easily.  Psychiatric/Behavioral: Negative for behavioral problems and dysphoric mood. The patient is not nervous/anxious.        Objective:   Physical Exam  Constitutional: He is oriented to person, place, and time. He appears well-developed.  HENT:  Head: Normocephalic.  Right Ear: External ear normal.  Left Ear: External ear normal.  Eyes: Conjunctivae and EOM are normal.  Neck: Normal range of motion.  Cardiovascular: Normal rate and normal heart sounds.   Pulmonary/Chest: Breath sounds normal.  Abdominal: Bowel sounds are normal.  Musculoskeletal: Normal range of motion. He exhibits no edema and no tenderness.  Neurological: He is alert and oriented to person, place, and time.  Psychiatric: He  has a normal mood and affect. His behavior is normal.          Assessment & Plan:   Hypertension well controlled, paroxysmal atrial fibrillation.  Continue Coumadin anticoagulation.  Dyslipidemia.  Continue statin therapy.  Hypothyroidism.  Recheck 3 months

## 2013-10-14 NOTE — Progress Notes (Signed)
Pre-visit discussion using our clinic review tool. No additional management support is needed unless otherwise documented below in the visit note.  

## 2013-10-15 ENCOUNTER — Telehealth: Payer: Self-pay | Admitting: Internal Medicine

## 2013-10-15 NOTE — Telephone Encounter (Signed)
Relevant patient education assigned to patient using Emmi. ° °

## 2013-10-22 ENCOUNTER — Ambulatory Visit (INDEPENDENT_AMBULATORY_CARE_PROVIDER_SITE_OTHER): Payer: Medicare Other | Admitting: *Deleted

## 2013-10-22 DIAGNOSIS — Z5181 Encounter for therapeutic drug level monitoring: Secondary | ICD-10-CM | POA: Diagnosis not present

## 2013-10-22 DIAGNOSIS — Z7901 Long term (current) use of anticoagulants: Secondary | ICD-10-CM | POA: Diagnosis not present

## 2013-10-22 DIAGNOSIS — I4891 Unspecified atrial fibrillation: Secondary | ICD-10-CM | POA: Diagnosis not present

## 2013-10-22 LAB — POCT INR: INR: 2.2

## 2013-10-27 DIAGNOSIS — L819 Disorder of pigmentation, unspecified: Secondary | ICD-10-CM | POA: Diagnosis not present

## 2013-10-27 DIAGNOSIS — L738 Other specified follicular disorders: Secondary | ICD-10-CM | POA: Diagnosis not present

## 2013-10-27 DIAGNOSIS — L678 Other hair color and hair shaft abnormalities: Secondary | ICD-10-CM | POA: Diagnosis not present

## 2013-10-27 DIAGNOSIS — D1801 Hemangioma of skin and subcutaneous tissue: Secondary | ICD-10-CM | POA: Diagnosis not present

## 2013-10-27 DIAGNOSIS — D235 Other benign neoplasm of skin of trunk: Secondary | ICD-10-CM | POA: Diagnosis not present

## 2013-12-03 ENCOUNTER — Ambulatory Visit (INDEPENDENT_AMBULATORY_CARE_PROVIDER_SITE_OTHER): Payer: Medicare Other | Admitting: Surgery

## 2013-12-03 DIAGNOSIS — Z5181 Encounter for therapeutic drug level monitoring: Secondary | ICD-10-CM | POA: Diagnosis not present

## 2013-12-03 DIAGNOSIS — Z7901 Long term (current) use of anticoagulants: Secondary | ICD-10-CM

## 2013-12-03 DIAGNOSIS — I4891 Unspecified atrial fibrillation: Secondary | ICD-10-CM | POA: Diagnosis not present

## 2013-12-03 LAB — POCT INR: INR: 2

## 2013-12-05 ENCOUNTER — Telehealth: Payer: Self-pay | Admitting: *Deleted

## 2013-12-05 ENCOUNTER — Encounter: Payer: Self-pay | Admitting: Cardiology

## 2013-12-05 ENCOUNTER — Other Ambulatory Visit: Payer: Self-pay | Admitting: *Deleted

## 2013-12-05 DIAGNOSIS — R079 Chest pain, unspecified: Secondary | ICD-10-CM

## 2013-12-05 DIAGNOSIS — I4891 Unspecified atrial fibrillation: Secondary | ICD-10-CM

## 2013-12-05 NOTE — Telephone Encounter (Signed)
Pt walk in complaining of having irregular hear rate and left chest pain specially when lying down and when racing his left arm. EKG done per RN read per Dr. Percival Spanish MD sinus bradycardia 59 beats/minute BP 112/70 right arm. Pt states he has not taking his medication Paceron 200 mg for 3 weeks because it [facility] much [facility] he started taking the med today. Dr Percival Spanish is aware of pt's chest pain score of "3" and recommends for pt to go to the ER. Pt states my heart  rate is fine. I will t wait, because my Chest pain may  be muscle pain". MD aware. Pt was send home in stable condition with wife.

## 2014-01-14 ENCOUNTER — Ambulatory Visit (INDEPENDENT_AMBULATORY_CARE_PROVIDER_SITE_OTHER): Payer: Medicare Other | Admitting: *Deleted

## 2014-01-14 DIAGNOSIS — Z5181 Encounter for therapeutic drug level monitoring: Secondary | ICD-10-CM | POA: Diagnosis not present

## 2014-01-14 DIAGNOSIS — Z7901 Long term (current) use of anticoagulants: Secondary | ICD-10-CM

## 2014-01-14 DIAGNOSIS — I4891 Unspecified atrial fibrillation: Secondary | ICD-10-CM | POA: Diagnosis not present

## 2014-01-14 LAB — POCT INR: INR: 2.2

## 2014-01-20 ENCOUNTER — Telehealth: Payer: Self-pay | Admitting: Cardiology

## 2014-01-20 NOTE — Telephone Encounter (Signed)
New message     Pt is having trouble getting on mychart.  Can you help him?

## 2014-01-21 ENCOUNTER — Ambulatory Visit (INDEPENDENT_AMBULATORY_CARE_PROVIDER_SITE_OTHER): Payer: Medicare Other | Admitting: Internal Medicine

## 2014-01-21 ENCOUNTER — Encounter: Payer: Self-pay | Admitting: Internal Medicine

## 2014-01-21 VITALS — BP 128/72 | HR 58 | Temp 98.3°F | Ht 71.0 in | Wt 196.0 lb

## 2014-01-21 DIAGNOSIS — E538 Deficiency of other specified B group vitamins: Secondary | ICD-10-CM

## 2014-01-21 DIAGNOSIS — R5383 Other fatigue: Principal | ICD-10-CM

## 2014-01-21 DIAGNOSIS — R5381 Other malaise: Secondary | ICD-10-CM | POA: Diagnosis not present

## 2014-01-21 DIAGNOSIS — E039 Hypothyroidism, unspecified: Secondary | ICD-10-CM

## 2014-01-21 DIAGNOSIS — I10 Essential (primary) hypertension: Secondary | ICD-10-CM

## 2014-01-21 DIAGNOSIS — I482 Chronic atrial fibrillation, unspecified: Secondary | ICD-10-CM

## 2014-01-21 DIAGNOSIS — D638 Anemia in other chronic diseases classified elsewhere: Secondary | ICD-10-CM | POA: Diagnosis not present

## 2014-01-21 DIAGNOSIS — I4891 Unspecified atrial fibrillation: Secondary | ICD-10-CM | POA: Diagnosis not present

## 2014-01-21 DIAGNOSIS — I2510 Atherosclerotic heart disease of native coronary artery without angina pectoris: Secondary | ICD-10-CM

## 2014-01-21 DIAGNOSIS — D509 Iron deficiency anemia, unspecified: Secondary | ICD-10-CM | POA: Insufficient documentation

## 2014-01-21 DIAGNOSIS — I480 Paroxysmal atrial fibrillation: Secondary | ICD-10-CM

## 2014-01-21 DIAGNOSIS — Z7901 Long term (current) use of anticoagulants: Secondary | ICD-10-CM

## 2014-01-21 LAB — CBC WITH DIFFERENTIAL/PLATELET
BASOS PCT: 0.4 % (ref 0.0–3.0)
Basophils Absolute: 0 10*3/uL (ref 0.0–0.1)
EOS PCT: 1.2 % (ref 0.0–5.0)
Eosinophils Absolute: 0.1 10*3/uL (ref 0.0–0.7)
HCT: 30.7 % — ABNORMAL LOW (ref 39.0–52.0)
Hemoglobin: 9.8 g/dL — ABNORMAL LOW (ref 13.0–17.0)
Lymphocytes Relative: 23.7 % (ref 12.0–46.0)
Lymphs Abs: 1.4 10*3/uL (ref 0.7–4.0)
MCHC: 31.9 g/dL (ref 30.0–36.0)
MCV: 80.2 fl (ref 78.0–100.0)
MONOS PCT: 10.4 % (ref 3.0–12.0)
Monocytes Absolute: 0.6 10*3/uL (ref 0.1–1.0)
NEUTROS PCT: 64.3 % (ref 43.0–77.0)
Neutro Abs: 3.7 10*3/uL (ref 1.4–7.7)
Platelets: 205 10*3/uL (ref 150.0–400.0)
RBC: 3.83 Mil/uL — ABNORMAL LOW (ref 4.22–5.81)
RDW: 19.1 % — ABNORMAL HIGH (ref 11.5–15.5)
WBC: 5.8 10*3/uL (ref 4.0–10.5)

## 2014-01-21 LAB — VITAMIN B12: Vitamin B-12: 672 pg/mL (ref 211–911)

## 2014-01-21 LAB — FERRITIN: Ferritin: 7 ng/mL — ABNORMAL LOW (ref 22.0–322.0)

## 2014-01-21 NOTE — Patient Instructions (Signed)
Return the stool samples to check for hidden blood  Return in 2 weeks for followup  Take an iron supplement twice daily

## 2014-01-21 NOTE — Progress Notes (Signed)
Pre visit review using our clinic review tool, if applicable. No additional management support is needed unless otherwise documented below in the visit note. 

## 2014-01-22 ENCOUNTER — Encounter: Payer: Self-pay | Admitting: Internal Medicine

## 2014-01-22 NOTE — Progress Notes (Signed)
Subjective:    Patient ID: [name], male    DOB: [birth year], 80 y.o.   MRN: [id number]  HPI 80 year old patient with multiple comorbidities, who is seen today with increasing fatigue over the past 2 or 3 months.  He does have a history of chronic fatigue and weakness.  He was seen at the [facility] recently and laboratory screen revealed anemia with a hemoglobin of 10 point 2, hematocrit of 31 point 9.  Plasma iron saturation were low.  MCV was normal.  He remains on chronic Coumadin anticoagulation due to atrial fibrillation.  The patient has had some intermittent anemia over the years.  No change in his bowel habits.  Denies any melena.  Otherwise, his laboratory studies, including TSH, were all normal.  Other hematologic parameters were normal  Past Medical History  Diagnosis Date  . HYPOTHYROIDISM 05/20/2007  . HYPERLIPIDEMIA 10/20/2008  . DEPRESSION 12/17/2007  . MITRAL REGURGITATION 10/20/2008  . HYPERTENSION 04/10/2007  . CORONARY ARTERY DISEASE     a. s/p stent OM 2002. b. s/p BMS 2006 RCA  c. s/p cath 2007.  [name] GERD 04/10/2007  . DIVERTICULOSIS, COLON 04/10/2007  . BENIGN PROSTATIC HYPERTROPHY 04/10/2007  . GYNECOMASTIA, UNILATERAL 03/23/2010  . SLEEP APNEA 05/20/2007  . WEAKNESS 11/09/2009  . Hematoma of abdominal wall   . Clotting disorder     anticoag. therapy  . Mallory - Weiss tear     > 5 years ago  . Barrett's esophagus   . Popliteal artery embolism, right   . History of echocardiogram     a. EF 55% (TEE 08/2011)  . Paroxysmal a-fib     a. chronic amiodarone and coumadin;  b. 05/2013 s/p DCCV.    History   Social History  . Marital Status: Married    Spouse Name: N/A    Number of Children: 1  . Years of Education: N/A   Occupational History  . retired    Social History Main Topics  . Smoking status: Never Smoker   . Smokeless tobacco: Never Used  . Alcohol Use: No  . Drug Use: No  . Sexual Activity: Not on file   Other Topics Concern  . Not on file    Social History Narrative   He is retired from Landscape [employer].   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  Father died in his 61's.  No CAD.    Past Surgical History  Procedure Laterality Date  . Transurethral resection of prostate    . Coronary angioplasty with stent placement    . Tee with cardioversion  7/08  . Cardioversion  8/08  . Esophagogastroduodenoscopy    . Tee without cardioversion  09/14/2011    Procedure: TRANSESOPHAGEAL ECHOCARDIOGRAM (TEE);  Surgeon: Josue Hector, MD;  Location: [facility] ENDOSCOPY;  Service: Cardiovascular;  Laterality: N/A;  . Cardioversion  09/14/2011    Procedure: CARDIOVERSION;  Surgeon: Josue Hector, MD;  Location: [facility] ENDOSCOPY;  Service: Cardiovascular;  Laterality: N/A;    Family History  Problem Relation Age of Onset  . Hypertension Other   . Heart disease Brother     CAD male 1st degree relative    No Known Allergies  Current Outpatient Prescriptions on File Prior to Visit  Medication Sig Dispense Refill  . amiodarone (PACERONE) 200 MG tablet Take 1 tablet (200 mg total) by mouth daily.      . clonazePAM (KLONOPIN) 1 MG tablet Take 1 mg by mouth at bedtime.        [name]  DIGESTIVE ENZYMES PO Take 1 tablet by mouth every evening.       . fish oil-omega-3 fatty acids 1000 MG capsule Take 3 g by mouth daily.      [name] levothyroxine (SYNTHROID, LEVOTHROID) 25 MCG tablet Take 25 mcg by mouth daily.       . Multiple Vitamin (MULTIVITAMIN) capsule Take 1 capsule by mouth daily.      . nitroGLYCERIN (NITROSTAT) 0.4 MG SL tablet Place 0.4 mg under the tongue every 5 (five) minutes as needed for chest pain.       . pantoprazole (PROTONIX) 40 MG tablet Take 40 mg by mouth daily.       . pravastatin (PRAVACHOL) 40 MG tablet Take 40 mg by mouth every evening.      [name] PRESCRIPTION MEDICATION Place 1 drop into both eyes daily as needed (dry eyes).      . traZODone (DESYREL) 50 MG tablet Take 1 to 2 pills at night as needed for sleep      . warfarin (COUMADIN) 5 MG tablet  Take 1 tablet (5 mg total) by mouth every evening.  90 tablet  3   No current facility-administered medications on file prior to visit.    BP 128/72  Pulse 58  Temp(Src) 98.3 F (36.8 C) (Oral)  Ht 5\' 11"  (1.803 m)  Wt 196 lb (88.905 kg)  BMI 27.35 kg/m2     Review of Systems  Constitutional: Positive for fatigue. Negative for fever, chills and appetite change.  HENT: Negative for congestion, dental problem, ear pain, hearing loss, sore throat, tinnitus, trouble swallowing and voice change.   Eyes: Negative for pain, discharge and visual disturbance.  Respiratory: Negative for cough, chest tightness, wheezing and stridor.   Cardiovascular: Negative for chest pain, palpitations and leg swelling.  Gastrointestinal: Negative for nausea, vomiting, abdominal pain, diarrhea, constipation, blood in stool and abdominal distention.  Genitourinary: Negative for urgency, hematuria, flank pain, discharge, difficulty urinating and genital sores.  Musculoskeletal: Negative for arthralgias, back pain, gait problem, joint swelling, myalgias and neck stiffness.  Skin: Negative for rash.  Neurological: Positive for weakness. Negative for dizziness, syncope, speech difficulty, numbness and headaches.  Hematological: Negative for adenopathy. Does not bruise/bleed easily.  Psychiatric/Behavioral: Negative for behavioral problems and dysphoric mood. The patient is not nervous/anxious.        Objective:   Physical Exam  Constitutional: He is oriented to person, place, and time. He appears well-developed.  HENT:  Head: Normocephalic.  Right Ear: External ear normal.  Left Ear: External ear normal.  Eyes: Conjunctivae and EOM are normal.  Neck: Normal range of motion.  Cardiovascular: Normal rate and normal heart sounds.   Pulmonary/Chest: Breath sounds normal.  Abdominal: Bowel sounds are normal.  Genitourinary: Guaiac negative stool.  Musculoskeletal: Normal range of motion. He exhibits no  edema and no tenderness.  Neurological: He is alert and oriented to person, place, and time.  Psychiatric: He has a normal mood and affect. His behavior is normal.          Assessment & Plan:   Moderate anemia.  This has been intermittent in the past.  Stool today's hematest negative.  We'll screen for occult blood further.  We'll check a ferritin and B12 level.  Doubtful GI evaluation will be appropriate unless hematest positive stool can be documented Chronic atrial fibrillation Chronic fatigue  Chronic Coumadin anticoagulation  Recheck 2 weeks

## 2014-01-26 ENCOUNTER — Ambulatory Visit: Payer: Medicare Other | Admitting: Internal Medicine

## 2014-01-27 ENCOUNTER — Other Ambulatory Visit: Payer: Self-pay | Admitting: Internal Medicine

## 2014-01-27 DIAGNOSIS — D509 Iron deficiency anemia, unspecified: Secondary | ICD-10-CM

## 2014-01-28 NOTE — Telephone Encounter (Signed)
Pt. States he ok now he is on my chart now

## 2014-01-29 ENCOUNTER — Other Ambulatory Visit (INDEPENDENT_AMBULATORY_CARE_PROVIDER_SITE_OTHER): Payer: Medicare Other

## 2014-01-29 DIAGNOSIS — D509 Iron deficiency anemia, unspecified: Secondary | ICD-10-CM | POA: Diagnosis not present

## 2014-01-29 LAB — HEMOCCULT GUIAC POC 1CARD (OFFICE)
Card #2 Fecal Occult Blod, POC: NEGATIVE
Card #3 Fecal Occult Blood, POC: NEGATIVE
Fecal Occult Blood, POC: NEGATIVE

## 2014-01-30 ENCOUNTER — Encounter: Payer: Self-pay | Admitting: Gastroenterology

## 2014-02-04 ENCOUNTER — Ambulatory Visit (INDEPENDENT_AMBULATORY_CARE_PROVIDER_SITE_OTHER): Payer: Medicare Other | Admitting: Internal Medicine

## 2014-02-04 ENCOUNTER — Encounter: Payer: Self-pay | Admitting: Internal Medicine

## 2014-02-04 VITALS — BP 140/80 | HR 57 | Temp 97.7°F | Resp 20 | Ht 71.0 in | Wt 194.0 lb

## 2014-02-04 DIAGNOSIS — R5383 Other fatigue: Principal | ICD-10-CM

## 2014-02-04 DIAGNOSIS — I2510 Atherosclerotic heart disease of native coronary artery without angina pectoris: Secondary | ICD-10-CM | POA: Diagnosis not present

## 2014-02-04 DIAGNOSIS — I4891 Unspecified atrial fibrillation: Secondary | ICD-10-CM

## 2014-02-04 DIAGNOSIS — Z7901 Long term (current) use of anticoagulants: Secondary | ICD-10-CM

## 2014-02-04 DIAGNOSIS — R5381 Other malaise: Secondary | ICD-10-CM

## 2014-02-04 DIAGNOSIS — D509 Iron deficiency anemia, unspecified: Secondary | ICD-10-CM

## 2014-02-04 DIAGNOSIS — I480 Paroxysmal atrial fibrillation: Secondary | ICD-10-CM

## 2014-02-04 LAB — CBC WITH DIFFERENTIAL/PLATELET
BASOS PCT: 0.4 % (ref 0.0–3.0)
Basophils Absolute: 0 10*3/uL (ref 0.0–0.1)
Eosinophils Absolute: 0.1 10*3/uL (ref 0.0–0.7)
Eosinophils Relative: 2.3 % (ref 0.0–5.0)
HCT: 32.6 % — ABNORMAL LOW (ref 39.0–52.0)
Hemoglobin: 10.4 g/dL — ABNORMAL LOW (ref 13.0–17.0)
LYMPHS ABS: 1.3 10*3/uL (ref 0.7–4.0)
Lymphocytes Relative: 21.5 % (ref 12.0–46.0)
MCHC: 31.9 g/dL (ref 30.0–36.0)
MCV: 82.9 fl (ref 78.0–100.0)
MONO ABS: 0.5 10*3/uL (ref 0.1–1.0)
Monocytes Relative: 8.6 % (ref 3.0–12.0)
NEUTROS PCT: 67.2 % (ref 43.0–77.0)
Neutro Abs: 3.9 10*3/uL (ref 1.4–7.7)
Platelets: 187 10*3/uL (ref 150.0–400.0)
RBC: 3.94 Mil/uL — AB (ref 4.22–5.81)
RDW: 23 % — ABNORMAL HIGH (ref 11.5–15.5)
WBC: 5.8 10*3/uL (ref 4.0–10.5)

## 2014-02-04 NOTE — Progress Notes (Signed)
Subjective:    Patient ID: [name], male    DOB: May 03, 1927, 80 y.o.   MRN: [id number]  HPI  80 year old patient, seen today for followup of iron deficiency anemia.  He remains weak.  He states that one year ago.  He was walking 2 miles per day.  At the present time.  He describes significant decrease in his exercise capacity.  He has been on iron twice daily, which she seems to tolerate well Today he gives a history of intermittent bright red rectal bleeding.  He states this occurs on a frequency of about once per month.  He had an episode recently, but a prior episode was 2 months ago.  He describes a fair amount of bright rectal bleeding and states he must apply cold compresses to make the bleeding stop.  He remains on chronic Coumadin anticoagulation for atrial fibrillation He has been seen by Dr. Romilda Garret in the past and does have a history of hemorrhoidal disease, as well as diverticulosis.  He is unclear of his last colonoscopy. He also has a history of Barrett's esophagus.  He has returned.  Stool specimens for occult blood that have been negative on 3 occasions   Past Medical History  Diagnosis Date  . HYPOTHYROIDISM 05/20/2007  . HYPERLIPIDEMIA 10/20/2008  . DEPRESSION 12/17/2007  . MITRAL REGURGITATION 10/20/2008  . HYPERTENSION 04/10/2007  . CORONARY ARTERY DISEASE     a. s/p stent OM 2002. b. s/p BMS 2006 RCA  c. s/p cath 2007.  [name] GERD 04/10/2007  . DIVERTICULOSIS, COLON 04/10/2007  . BENIGN PROSTATIC HYPERTROPHY 04/10/2007  . GYNECOMASTIA, UNILATERAL 03/23/2010  . SLEEP APNEA 05/20/2007  . WEAKNESS 11/09/2009  . Hematoma of abdominal wall   . Clotting disorder     anticoag. therapy  . Mallory - Weiss tear     > 5 years ago  . Barrett's esophagus   . Popliteal artery embolism, right   . History of echocardiogram     a. EF 55% (TEE 08/2011)  . Paroxysmal a-fib     a. chronic amiodarone and coumadin;  b. 05/2013 s/p DCCV.    History   Social History  . Marital Status:  Married    Spouse Name: N/A    Number of Children: 1  . Years of Education: N/A   Occupational History  . retired    Social History Main Topics  . Smoking status: Never Smoker   . Smokeless tobacco: Never Used  . Alcohol Use: No  . Drug Use: No  . Sexual Activity: Not on file   Other Topics Concern  . Not on file   Social History Narrative   He is retired from Landscape [employer].   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  Father died in his 7's.  No CAD.    Past Surgical History  Procedure Laterality Date  . Transurethral resection of prostate    . Coronary angioplasty with stent placement    . Tee with cardioversion  7/08  . Cardioversion  8/08  . Esophagogastroduodenoscopy    . Tee without cardioversion  09/14/2011    Procedure: TRANSESOPHAGEAL ECHOCARDIOGRAM (TEE);  Surgeon: Josue Hector, MD;  Location: [facility] ENDOSCOPY;  Service: Cardiovascular;  Laterality: N/A;  . Cardioversion  09/14/2011    Procedure: CARDIOVERSION;  Surgeon: Josue Hector, MD;  Location: [facility] ENDOSCOPY;  Service: Cardiovascular;  Laterality: N/A;    Family History  Problem Relation Age of Onset  . Hypertension Other   . Heart disease  Brother     CAD male 1st degree relative    No Known Allergies  Current Outpatient Prescriptions on File Prior to Visit  Medication Sig Dispense Refill  . clonazePAM (KLONOPIN) 1 MG tablet Take 1 mg by mouth at bedtime.        [name] DIGESTIVE ENZYMES PO Take 1 tablet by mouth every evening.       . fish oil-omega-3 fatty acids 1000 MG capsule Take 3 g by mouth daily.      . hydroxypropyl methylcellulose (ISOPTO TEARS) 2.5 % ophthalmic solution 1 drop.      [name] levothyroxine (SYNTHROID, LEVOTHROID) 25 MCG tablet Take 25 mcg by mouth daily.       . Multiple Vitamin (MULTIVITAMIN) capsule Take 1 capsule by mouth daily.      . nitroGLYCERIN (NITROSTAT) 0.4 MG SL tablet Place 0.4 mg under the tongue every 5 (five) minutes as needed for chest pain.       . pantoprazole (PROTONIX) 40 MG  tablet Take 40 mg by mouth daily.       . pravastatin (PRAVACHOL) 40 MG tablet Take 40 mg by mouth every evening.      [name] PRESCRIPTION MEDICATION Place 1 drop into both eyes daily as needed (dry eyes).      . traZODone (DESYREL) 50 MG tablet Take 1 to 2 pills at night as needed for sleep      . warfarin (COUMADIN) 5 MG tablet Take 1 tablet (5 mg total) by mouth every evening.  90 tablet  3  . amiodarone (PACERONE) 200 MG tablet Take 1 tablet (200 mg total) by mouth daily.       No current facility-administered medications on file prior to visit.    BP 140/80  Pulse 57  Temp(Src) 97.7 F (36.5 C) (Oral)  Resp 20  Ht 5\' 11"  (1.803 m)  Wt 194 lb (87.998 kg)  BMI 27.07 kg/m2  SpO2 98%         Review of Systems  Constitutional: Positive for activity change and fatigue. Negative for fever, chills and appetite change.  HENT: Negative for congestion, dental problem, ear pain, hearing loss, sore throat, tinnitus, trouble swallowing and voice change.   Eyes: Negative for pain, discharge and visual disturbance.  Respiratory: Negative for cough, chest tightness, wheezing and stridor.   Cardiovascular: Negative for chest pain, palpitations and leg swelling.  Gastrointestinal: Negative for nausea, vomiting, abdominal pain, diarrhea, constipation, blood in stool and abdominal distention.  Genitourinary: Negative for urgency, hematuria, flank pain, discharge, difficulty urinating and genital sores.  Musculoskeletal: Negative for arthralgias, back pain, gait problem, joint swelling, myalgias and neck stiffness.  Skin: Negative for rash.  Neurological: Positive for weakness. Negative for dizziness, syncope, speech difficulty, numbness and headaches.  Hematological: Negative for adenopathy. Does not bruise/bleed easily.  Psychiatric/Behavioral: Negative for behavioral problems and dysphoric mood. The patient is not nervous/anxious.        Objective:   Physical Exam  Constitutional: He is  oriented to person, place, and time. He appears well-developed.  HENT:  Head: Normocephalic.  Right Ear: External ear normal.  Left Ear: External ear normal.  Eyes: Conjunctivae and EOM are normal.  Neck: Normal range of motion.  Cardiovascular: Normal rate and normal heart sounds.   Pulmonary/Chest: Breath sounds normal.  Abdominal: Bowel sounds are normal.  Genitourinary: Guaiac positive stool.  External hemorrhoidal type Small internal hemorrhoids noted on exam  Stool hematest positive  Musculoskeletal: Normal range of motion. He exhibits no  edema and no tenderness.  Neurological: He is alert and oriented to person, place, and time.  Psychiatric: He has a normal mood and affect. His behavior is normal.          Assessment & Plan:   Iron deficiency anemia.  Probably secondary to hemorrhoidal bleeding.  The patient does have a GI consultation scheduled.  Continue supplemental iron therapy.  Check CBC today  Chronic Coumadin anticoagulation Coronary artery disease Chronic fatigue.  Unclear how important a factor.  Anemia is contributing  Recheck 4 weeks GI followup as scheduled Increase iron to 3 times a day if tolerated

## 2014-02-04 NOTE — Progress Notes (Signed)
Pre visit review using our clinic review tool, if applicable. No additional management support is needed unless otherwise documented below in the visit note. 

## 2014-02-04 NOTE — Patient Instructions (Signed)
Iron supplements 3 times daily  Call or return to clinic prn if these symptoms worsen or fail to improve as anticipated.  Return in one month for followup  GI followup as scheduled

## 2014-02-11 ENCOUNTER — Telehealth: Payer: Self-pay | Admitting: Internal Medicine

## 2014-02-11 NOTE — Telephone Encounter (Signed)
Kelly from [facility] is calling in regards to a referral for pt. She received a referral for the pt however the [facility] has been with [facility] less that 6 months, states pt will have to be seen by [facility]. States if pt is seen with them the records would have to be sent and the dr would have to review and it would take longer for pt to get in. Pt is needed an appt soon because of hemoglobin very low.

## 2014-02-12 DIAGNOSIS — K625 Hemorrhage of anus and rectum: Secondary | ICD-10-CM | POA: Diagnosis not present

## 2014-02-12 DIAGNOSIS — R634 Abnormal weight loss: Secondary | ICD-10-CM | POA: Diagnosis not present

## 2014-02-12 DIAGNOSIS — D509 Iron deficiency anemia, unspecified: Secondary | ICD-10-CM | POA: Diagnosis not present

## 2014-02-16 ENCOUNTER — Ambulatory Visit: Payer: Medicare Other | Admitting: Gastroenterology

## 2014-02-17 ENCOUNTER — Other Ambulatory Visit: Payer: Self-pay | Admitting: Gastroenterology

## 2014-02-17 ENCOUNTER — Ambulatory Visit: Payer: Medicare Other | Admitting: Gastroenterology

## 2014-02-17 DIAGNOSIS — D509 Iron deficiency anemia, unspecified: Secondary | ICD-10-CM | POA: Diagnosis not present

## 2014-02-17 DIAGNOSIS — K2960 Other gastritis without bleeding: Secondary | ICD-10-CM | POA: Diagnosis not present

## 2014-02-17 DIAGNOSIS — D131 Benign neoplasm of stomach: Secondary | ICD-10-CM | POA: Diagnosis not present

## 2014-02-25 ENCOUNTER — Ambulatory Visit (INDEPENDENT_AMBULATORY_CARE_PROVIDER_SITE_OTHER): Payer: Medicare Other | Admitting: Pharmacist

## 2014-02-25 DIAGNOSIS — Z7901 Long term (current) use of anticoagulants: Secondary | ICD-10-CM

## 2014-02-25 DIAGNOSIS — I4891 Unspecified atrial fibrillation: Secondary | ICD-10-CM

## 2014-02-25 DIAGNOSIS — Z5181 Encounter for therapeutic drug level monitoring: Secondary | ICD-10-CM

## 2014-02-25 DIAGNOSIS — I482 Chronic atrial fibrillation, unspecified: Secondary | ICD-10-CM

## 2014-02-25 LAB — POCT INR: INR: 2

## 2014-03-04 ENCOUNTER — Ambulatory Visit (INDEPENDENT_AMBULATORY_CARE_PROVIDER_SITE_OTHER): Payer: Medicare Other | Admitting: Internal Medicine

## 2014-03-04 ENCOUNTER — Encounter: Payer: Self-pay | Admitting: Internal Medicine

## 2014-03-04 VITALS — BP 136/80 | HR 53 | Temp 98.0°F | Resp 20 | Ht 71.0 in | Wt 194.0 lb

## 2014-03-04 DIAGNOSIS — R5381 Other malaise: Secondary | ICD-10-CM

## 2014-03-04 DIAGNOSIS — I10 Essential (primary) hypertension: Secondary | ICD-10-CM | POA: Diagnosis not present

## 2014-03-04 DIAGNOSIS — I4891 Unspecified atrial fibrillation: Secondary | ICD-10-CM

## 2014-03-04 DIAGNOSIS — I480 Paroxysmal atrial fibrillation: Secondary | ICD-10-CM

## 2014-03-04 DIAGNOSIS — D509 Iron deficiency anemia, unspecified: Secondary | ICD-10-CM | POA: Diagnosis not present

## 2014-03-04 DIAGNOSIS — I2510 Atherosclerotic heart disease of native coronary artery without angina pectoris: Secondary | ICD-10-CM | POA: Diagnosis not present

## 2014-03-04 DIAGNOSIS — R5383 Other fatigue: Principal | ICD-10-CM

## 2014-03-04 LAB — CBC WITH DIFFERENTIAL/PLATELET
Basophils Absolute: 0 10*3/uL (ref 0.0–0.1)
Basophils Relative: 0.5 % (ref 0.0–3.0)
EOS ABS: 0.1 10*3/uL (ref 0.0–0.7)
Eosinophils Relative: 1.6 % (ref 0.0–5.0)
HEMATOCRIT: 35.5 % — AB (ref 39.0–52.0)
HEMOGLOBIN: 11.6 g/dL — AB (ref 13.0–17.0)
LYMPHS ABS: 1.3 10*3/uL (ref 0.7–4.0)
Lymphocytes Relative: 24.2 % (ref 12.0–46.0)
MCHC: 32.8 g/dL (ref 30.0–36.0)
MCV: 87.1 fl (ref 78.0–100.0)
MONOS PCT: 9.7 % (ref 3.0–12.0)
Monocytes Absolute: 0.5 10*3/uL (ref 0.1–1.0)
NEUTROS ABS: 3.4 10*3/uL (ref 1.4–7.7)
Neutrophils Relative %: 64 % (ref 43.0–77.0)
Platelets: 172 10*3/uL (ref 150.0–400.0)
RBC: 4.07 Mil/uL — ABNORMAL LOW (ref 4.22–5.81)
RDW: 26.5 % — ABNORMAL HIGH (ref 11.5–15.5)
WBC: 5.4 10*3/uL (ref 4.0–10.5)

## 2014-03-04 NOTE — Progress Notes (Signed)
Subjective:    Patient ID: [name], male    DOB: 12/12/1926, 80 y.o.   MRN: [id number]  HPI 80 year old patient who is seen today in followup.  He has a history of iron deficiency anemia and since his last visit here.  He has had upper endoscopy that did reveal a benign friable gastric adenoma.  He is on iron therapy twice daily, which he seems to tolerate well.  He states the past 3 or 4 days.  He has felt much better He remains on chronic Coumadin anticoagulation due to atrial fibrillation.  Past Medical History  Diagnosis Date  . HYPOTHYROIDISM 05/20/2007  . HYPERLIPIDEMIA 10/20/2008  . DEPRESSION 12/17/2007  . MITRAL REGURGITATION 10/20/2008  . HYPERTENSION 04/10/2007  . CORONARY ARTERY DISEASE     a. s/p stent OM 2002. b. s/p BMS 2006 RCA  c. s/p cath 2007.  [name] GERD 04/10/2007  . DIVERTICULOSIS, COLON 04/10/2007  . BENIGN PROSTATIC HYPERTROPHY 04/10/2007  . GYNECOMASTIA, UNILATERAL 03/23/2010  . SLEEP APNEA 05/20/2007  . WEAKNESS 11/09/2009  . Hematoma of abdominal wall   . Clotting disorder     anticoag. therapy  . Mallory - Weiss tear     > 5 years ago  . Barrett's esophagus   . Popliteal artery embolism, right   . History of echocardiogram     a. EF 55% (TEE 08/2011)  . Paroxysmal a-fib     a. chronic amiodarone and coumadin;  b. 05/2013 s/p DCCV.    History   Social History  . Marital Status: Married    Spouse Name: N/A    Number of Children: 1  . Years of Education: N/A   Occupational History  . retired    Social History Main Topics  . Smoking status: Never Smoker   . Smokeless tobacco: Never Used  . Alcohol Use: No  . Drug Use: No  . Sexual Activity: Not on file   Other Topics Concern  . Not on file   Social History Narrative   He is retired from Landscape [employer].   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  Father died in his 43's.  No CAD.    Past Surgical History  Procedure Laterality Date  . Transurethral resection of prostate    . Coronary angioplasty with  stent placement    . Tee with cardioversion  7/08  . Cardioversion  8/08  . Esophagogastroduodenoscopy    . Tee without cardioversion  09/14/2011    Procedure: TRANSESOPHAGEAL ECHOCARDIOGRAM (TEE);  Surgeon: Josue Hector, MD;  Location: [facility] ENDOSCOPY;  Service: Cardiovascular;  Laterality: N/A;  . Cardioversion  09/14/2011    Procedure: CARDIOVERSION;  Surgeon: Josue Hector, MD;  Location: [facility] ENDOSCOPY;  Service: Cardiovascular;  Laterality: N/A;    Family History  Problem Relation Age of Onset  . Hypertension Other   . Heart disease Brother     CAD male 1st degree relative    No Known Allergies  Current Outpatient Prescriptions on File Prior to Visit  Medication Sig Dispense Refill  . clonazePAM (KLONOPIN) 1 MG tablet Take 1 mg by mouth at bedtime.        [name] DIGESTIVE ENZYMES PO Take 1 tablet by mouth every evening.       . fish oil-omega-3 fatty acids 1000 MG capsule Take 3 g by mouth daily.      . hydroxypropyl methylcellulose (ISOPTO TEARS) 2.5 % ophthalmic solution 1 drop.      [name] levothyroxine (SYNTHROID, LEVOTHROID) 25  MCG tablet Take 25 mcg by mouth daily.       . Multiple Vitamin (MULTIVITAMIN) capsule Take 1 capsule by mouth daily.      . nitroGLYCERIN (NITROSTAT) 0.4 MG SL tablet Place 0.4 mg under the tongue every 5 (five) minutes as needed for chest pain.       . pantoprazole (PROTONIX) 40 MG tablet Take 40 mg by mouth daily.       . pravastatin (PRAVACHOL) 40 MG tablet Take 40 mg by mouth every evening.      [name] PRESCRIPTION MEDICATION Place 1 drop into both eyes daily as needed (dry eyes).      . traZODone (DESYREL) 50 MG tablet Take 1 to 2 pills at night as needed for sleep      . warfarin (COUMADIN) 5 MG tablet Take 1 tablet (5 mg total) by mouth every evening.  90 tablet  3  . amiodarone (PACERONE) 200 MG tablet Take 1 tablet (200 mg total) by mouth daily.       No current facility-administered medications on file prior to visit.    BP 136/80  Pulse 53   Temp(Src) 98 F (36.7 C) (Oral)  Resp 20  Ht 5\' 11"  (1.803 m)  Wt 194 lb (87.998 kg)  BMI 27.07 kg/m2  SpO2 98%      Review of Systems  Constitutional: Positive for fatigue. Negative for fever, chills and appetite change.  HENT: Negative for congestion, dental problem, ear pain, hearing loss, sore throat, tinnitus, trouble swallowing and voice change.   Eyes: Negative for pain, discharge and visual disturbance.  Respiratory: Negative for cough, chest tightness, wheezing and stridor.   Cardiovascular: Negative for chest pain, palpitations and leg swelling.  Gastrointestinal: Negative for nausea, vomiting, abdominal pain, diarrhea, constipation, blood in stool and abdominal distention.  Genitourinary: Negative for urgency, hematuria, flank pain, discharge, difficulty urinating and genital sores.  Musculoskeletal: Negative for arthralgias, back pain, gait problem, joint swelling, myalgias and neck stiffness.  Skin: Negative for rash.  Neurological: Positive for weakness. Negative for dizziness, syncope, speech difficulty, numbness and headaches.  Hematological: Negative for adenopathy. Does not bruise/bleed easily.  Psychiatric/Behavioral: Negative for behavioral problems and dysphoric mood. The patient is not nervous/anxious.        Objective:   Physical Exam  Constitutional: He is oriented to person, place, and time. He appears well-developed.  HENT:  Head: Normocephalic.  Right Ear: External ear normal.  Left Ear: External ear normal.  Eyes: Conjunctivae and EOM are normal.  Neck: Normal range of motion.  Cardiovascular: Normal rate and normal heart sounds.   Pulmonary/Chest: Breath sounds normal.  Abdominal: Bowel sounds are normal.  Musculoskeletal: Normal range of motion. He exhibits no edema and no tenderness.  Neurological: He is alert and oriented to person, place, and time.  Psychiatric: He has a normal mood and affect. His behavior is normal.            Assessment & Plan:   On deficiency anemia.  We'll check a followup CBC continue iron therapy Weakness improved Atrial fibrillation.  Continue chronic anticoagulation  Recheck 4 weeks

## 2014-03-04 NOTE — Progress Notes (Signed)
Pre visit review using our clinic review tool, if applicable. No additional management support is needed unless otherwise documented below in the visit note. 

## 2014-03-04 NOTE — Patient Instructions (Signed)
Continue iron supplements twice daily  Return in one month for follow-up

## 2014-03-11 ENCOUNTER — Encounter: Payer: Self-pay | Admitting: Internal Medicine

## 2014-03-19 ENCOUNTER — Ambulatory Visit (INDEPENDENT_AMBULATORY_CARE_PROVIDER_SITE_OTHER): Payer: Medicare Other | Admitting: *Deleted

## 2014-03-19 ENCOUNTER — Telehealth: Payer: Self-pay | Admitting: Cardiology

## 2014-03-19 VITALS — BP 110/70 | HR 118 | Ht 72.0 in | Wt 192.6 lb

## 2014-03-19 DIAGNOSIS — I4891 Unspecified atrial fibrillation: Secondary | ICD-10-CM | POA: Diagnosis not present

## 2014-03-19 MED ORDER — METOPROLOL TARTRATE 25 MG PO TABS: 12.5000 mg | ORAL_TABLET | Freq: Two times a day (BID) | ORAL | Status: DC

## 2014-03-19 NOTE — Telephone Encounter (Signed)
I spoke with patient and he will come in for an EKG.

## 2014-03-19 NOTE — Telephone Encounter (Signed)
Bring in for an EKG as discussed.  Dr. Lemmie Evens

## 2014-03-19 NOTE — Telephone Encounter (Signed)
Spoke with patient.  He is complaining of a fast heartbeat.  Patient woke up this morning feeling "washed out" and had an episode of presyncope when he got up to the bathroom.  He had to "fall across the bed."  He took his bp and noticed that his heart rate was elevated-124 and 116.  His bp was 109/74 and 117/98.  Since that time he is still feeling washed out and has had several "loose bowels."  He thinks the bowel issue is coming from iron he started recently.  He denise chest pain, but does admit to minor shortness of breath.  He doesn't feel palpitations; he is only aware of his heart beat due to monitor readings. He has taken all meds this morning.  I will defer to Dr Debara Pickett for further instructions.

## 2014-03-19 NOTE — Progress Notes (Signed)
Dr Debara Pickett was consulted on the EKG and confirmed Afib.  Dr Debara Pickett gave a voice order to start Metoprolol 12.5mg  twice a day and follow up with Dr Percival Spanish next week.  I reviewed information with patient.  He was agreeable and appreciative of the care. I advised patient to contact our office immediately if symptoms worsened or did not improve.

## 2014-03-19 NOTE — Patient Instructions (Addendum)
Start Metoprolol 25mg  1/2 tablet twice a day.  Your appointment is 03/25/14 at 9:45 with Dr Percival Spanish at the [facility] [place] office.

## 2014-03-20 ENCOUNTER — Encounter (HOSPITAL_COMMUNITY): Payer: Self-pay | Admitting: Emergency Medicine

## 2014-03-20 ENCOUNTER — Telehealth: Payer: Self-pay | Admitting: Cardiology

## 2014-03-20 ENCOUNTER — Emergency Department (HOSPITAL_COMMUNITY): Payer: Medicare Other

## 2014-03-20 ENCOUNTER — Inpatient Hospital Stay (HOSPITAL_COMMUNITY)
Admission: EM | Admit: 2014-03-20 | Discharge: 2014-03-24 | DRG: 309 | Disposition: A | Discharge status: Home / Self Care | Payer: Medicare Other | Attending: Cardiology | Admitting: Cardiology

## 2014-03-20 DIAGNOSIS — E785 Hyperlipidemia, unspecified: Secondary | ICD-10-CM | POA: Diagnosis present

## 2014-03-20 DIAGNOSIS — Z9861 Coronary angioplasty status: Secondary | ICD-10-CM | POA: Diagnosis not present

## 2014-03-20 DIAGNOSIS — I10 Essential (primary) hypertension: Secondary | ICD-10-CM | POA: Diagnosis not present

## 2014-03-20 DIAGNOSIS — Z79899 Other long term (current) drug therapy: Secondary | ICD-10-CM

## 2014-03-20 DIAGNOSIS — R5381 Other malaise: Secondary | ICD-10-CM | POA: Diagnosis not present

## 2014-03-20 DIAGNOSIS — I480 Paroxysmal atrial fibrillation: Secondary | ICD-10-CM

## 2014-03-20 DIAGNOSIS — E039 Hypothyroidism, unspecified: Secondary | ICD-10-CM | POA: Diagnosis present

## 2014-03-20 DIAGNOSIS — I209 Angina pectoris, unspecified: Secondary | ICD-10-CM | POA: Diagnosis not present

## 2014-03-20 DIAGNOSIS — R0602 Shortness of breath: Secondary | ICD-10-CM | POA: Diagnosis not present

## 2014-03-20 DIAGNOSIS — R404 Transient alteration of awareness: Secondary | ICD-10-CM | POA: Diagnosis not present

## 2014-03-20 DIAGNOSIS — R079 Chest pain, unspecified: Secondary | ICD-10-CM | POA: Diagnosis present

## 2014-03-20 DIAGNOSIS — I498 Other specified cardiac arrhythmias: Secondary | ICD-10-CM | POA: Diagnosis not present

## 2014-03-20 DIAGNOSIS — G4733 Obstructive sleep apnea (adult) (pediatric): Secondary | ICD-10-CM | POA: Diagnosis present

## 2014-03-20 DIAGNOSIS — K5730 Diverticulosis of large intestine without perforation or abscess without bleeding: Secondary | ICD-10-CM | POA: Diagnosis present

## 2014-03-20 DIAGNOSIS — I4819 Other persistent atrial fibrillation: Secondary | ICD-10-CM

## 2014-03-20 DIAGNOSIS — N400 Enlarged prostate without lower urinary tract symptoms: Secondary | ICD-10-CM | POA: Diagnosis present

## 2014-03-20 DIAGNOSIS — Z7901 Long term (current) use of anticoagulants: Secondary | ICD-10-CM

## 2014-03-20 DIAGNOSIS — I340 Nonrheumatic mitral (valve) insufficiency: Secondary | ICD-10-CM | POA: Diagnosis present

## 2014-03-20 DIAGNOSIS — Z8249 Family history of ischemic heart disease and other diseases of the circulatory system: Secondary | ICD-10-CM

## 2014-03-20 DIAGNOSIS — N179 Acute kidney failure, unspecified: Secondary | ICD-10-CM | POA: Diagnosis present

## 2014-03-20 DIAGNOSIS — K219 Gastro-esophageal reflux disease without esophagitis: Secondary | ICD-10-CM | POA: Diagnosis present

## 2014-03-20 DIAGNOSIS — I059 Rheumatic mitral valve disease, unspecified: Secondary | ICD-10-CM | POA: Diagnosis present

## 2014-03-20 DIAGNOSIS — R5383 Other fatigue: Secondary | ICD-10-CM

## 2014-03-20 DIAGNOSIS — I2510 Atherosclerotic heart disease of native coronary artery without angina pectoris: Secondary | ICD-10-CM | POA: Diagnosis present

## 2014-03-20 DIAGNOSIS — I4891 Unspecified atrial fibrillation: Secondary | ICD-10-CM | POA: Diagnosis not present

## 2014-03-20 LAB — COMPREHENSIVE METABOLIC PANEL
ALT: 14 U/L (ref 0–53)
ANION GAP: 11 (ref 5–15)
AST: 19 U/L (ref 0–37)
Albumin: 3.1 g/dL — ABNORMAL LOW (ref 3.5–5.2)
Alkaline Phosphatase: 63 U/L (ref 39–117)
BUN: 20 mg/dL (ref 6–23)
CALCIUM: 8.5 mg/dL (ref 8.4–10.5)
CO2: 22 meq/L (ref 19–32)
CREATININE: 1.27 mg/dL (ref 0.50–1.35)
Chloride: 107 mEq/L (ref 96–112)
GFR calc Af Amer: 58 mL/min — ABNORMAL LOW (ref >90)
GFR, EST NON AFRICAN AMERICAN: 50 mL/min — AB (ref >90)
GLUCOSE: 122 mg/dL — AB (ref 70–99)
Potassium: 4.3 mEq/L (ref 3.7–5.3)
SODIUM: 140 meq/L (ref 137–147)
Total Bilirubin: 0.4 mg/dL (ref 0.3–1.2)
Total Protein: 5.9 g/dL — ABNORMAL LOW (ref 6.0–8.3)

## 2014-03-20 LAB — PROTIME-INR
INR: 2.23 — AB (ref 0.00–1.49)
Prothrombin Time: 24.7 seconds — ABNORMAL HIGH (ref 11.6–15.2)

## 2014-03-20 LAB — CBC WITH DIFFERENTIAL/PLATELET
BASOS ABS: 0 10*3/uL (ref 0.0–0.1)
Basophils Relative: 0 % (ref 0–1)
Eosinophils Absolute: 0 10*3/uL (ref 0.0–0.7)
Eosinophils Relative: 1 % (ref 0–5)
HCT: 38.2 % — ABNORMAL LOW (ref 39.0–52.0)
Hemoglobin: 12.7 g/dL — ABNORMAL LOW (ref 13.0–17.0)
LYMPHS PCT: 21 % (ref 12–46)
Lymphs Abs: 1 10*3/uL (ref 0.7–4.0)
MCH: 30 pg (ref 26.0–34.0)
MCHC: 33.2 g/dL (ref 30.0–36.0)
MCV: 90.1 fL (ref 78.0–100.0)
MONOS PCT: 12 % (ref 3–12)
Monocytes Absolute: 0.6 10*3/uL (ref 0.1–1.0)
Neutro Abs: 3.1 10*3/uL (ref 1.7–7.7)
Neutrophils Relative %: 66 % (ref 43–77)
PLATELETS: 148 10*3/uL — AB (ref 150–400)
RBC: 4.24 MIL/uL (ref 4.22–5.81)
RDW: 23.2 % — ABNORMAL HIGH (ref 11.5–15.5)
WBC: 4.7 10*3/uL (ref 4.0–10.5)

## 2014-03-20 LAB — I-STAT TROPONIN, ED: TROPONIN I, POC: 0 ng/mL (ref 0.00–0.08)

## 2014-03-20 LAB — PRO B NATRIURETIC PEPTIDE: PRO B NATRI PEPTIDE: 2049 pg/mL — AB (ref 0–450)

## 2014-03-20 LAB — TROPONIN I: Troponin I: <0.3 ng/mL (ref <0.30)

## 2014-03-20 LAB — MRSA PCR SCREENING: MRSA by PCR: NEGATIVE

## 2014-03-20 MED ORDER — ASPIRIN EC 81 MG PO TBEC
81.0000 mg | DELAYED_RELEASE_TABLET | Freq: Every day | ORAL | Status: DC
  Administered 2014-03-21 – 2014-03-24 (×4): 81 mg via ORAL
  Filled 2014-03-20 (×4): qty 1

## 2014-03-20 MED ORDER — SODIUM CHLORIDE 0.9 % IJ SOLN: 3.0000 mL | INTRAMUSCULAR | Status: DC | PRN

## 2014-03-20 MED ORDER — ASPIRIN 81 MG PO CHEW
324.0000 mg | CHEWABLE_TABLET | ORAL | Status: AC
  Filled 2014-03-20: qty 4

## 2014-03-20 MED ORDER — ONDANSETRON HCL 4 MG/2ML IJ SOLN: 4.0000 mg | Freq: Four times a day (QID) | INTRAMUSCULAR | Status: DC | PRN

## 2014-03-20 MED ORDER — ACETAMINOPHEN 325 MG PO TABS: 650.0000 mg | ORAL_TABLET | ORAL | Status: DC | PRN

## 2014-03-20 MED ORDER — FUROSEMIDE 10 MG/ML IJ SOLN
40.0000 mg | Freq: Once | INTRAMUSCULAR | Status: AC
  Administered 2014-03-20: 40 mg via INTRAVENOUS
  Filled 2014-03-20: qty 4

## 2014-03-20 MED ORDER — ASPIRIN 300 MG RE SUPP
300.0000 mg | RECTAL | Status: AC
  Filled 2014-03-20: qty 1

## 2014-03-20 MED ORDER — SODIUM CHLORIDE 0.9 % IJ SOLN
3.0000 mL | Freq: Two times a day (BID) | INTRAMUSCULAR | Status: DC
  Administered 2014-03-21 – 2014-03-24 (×6): 3 mL via INTRAVENOUS

## 2014-03-20 MED ORDER — NITROGLYCERIN 0.4 MG SL SUBL: 0.4000 mg | SUBLINGUAL_TABLET | SUBLINGUAL | Status: DC | PRN

## 2014-03-20 MED ORDER — SODIUM CHLORIDE 0.9 % IV SOLN
250.0000 mL | INTRAVENOUS | Status: DC | PRN
  Administered 2014-03-23: 250 mL via INTRAVENOUS

## 2014-03-20 NOTE — ED Notes (Signed)
Patient transported to X-ray 

## 2014-03-20 NOTE — ED Notes (Signed)
Pt. States that he is feeling better than he did before but not 100%. Denies chest pain.

## 2014-03-20 NOTE — H&P (Signed)
Primary Physician: Primary Cardiologist:  Hochrein   HPI: Asked to see patient re afib with RVR Patient is an 80 yo with HTN, CAD (s/p PCI/stent x 4), PAF, hypothyroid.   Patient called in to office yesterday  Complained of fast heart beat  Felt washed out, dizzy  Denies palpitations  Had to fall across bed after getting up.  Has had severa loose bowel movements.  Mild SOB   Came in for EKG  Told afib  Dr Debara Pickett recomm metoprolol 12.5 bisd This AM called office  Said going to ER  Felt worse since starting metoprolol  Intermitt L arm pain for several weeks.   Worse yesterday and today (L arm pain) Does not usually get L arm pain.  Has also intermitt L chest pain  Sharp  UGI and colonoscopy last month  Unremarkable.      Echo n 2012:  LVEF 55 to 60^ with mod LVH   His last catheter in 2007 showed a 40% tubular lesion in the ostium of the LAD, 50-60% mid LAD, 70% diagonal, 30% circumflex, 40% OM, RCA dominant. The stent in the RCA had 30% mid and the distal stent had 50-60% in-stent restenosis. PDA had 40% stenosis he had moderate mitral regurgitation and preserved LV function.     Past Medical History  Diagnosis Date  . HYPOTHYROIDISM 05/20/2007  . HYPERLIPIDEMIA 10/20/2008  . DEPRESSION 12/17/2007  . MITRAL REGURGITATION 10/20/2008  . HYPERTENSION 04/10/2007  . CORONARY ARTERY DISEASE     a. s/p stent OM 2002. b. s/p BMS 2006 RCA  c. s/p cath 2007.  [name] GERD 04/10/2007  . DIVERTICULOSIS, COLON 04/10/2007  . BENIGN PROSTATIC HYPERTROPHY 04/10/2007  . GYNECOMASTIA, UNILATERAL 03/23/2010  . SLEEP APNEA 05/20/2007  . WEAKNESS 11/09/2009  . Hematoma of abdominal wall   . Clotting disorder     anticoag. therapy  . Mallory - Weiss tear     > 5 years ago  . Barrett's esophagus   . Popliteal artery embolism, right   . History of echocardiogram     a. EF 55% (TEE 08/2011)  . Paroxysmal a-fib     a. chronic amiodarone and coumadin;  b. 05/2013 s/p DCCV.     (Not in a hospital  admission)     Infusions:   No Known Allergies  History   Social History  . Marital Status: Married    Spouse Name: N/A    Number of Children: 1  . Years of Education: N/A   Occupational History  . retired    Social History Main Topics  . Smoking status: Never Smoker   . Smokeless tobacco: Never Used  . Alcohol Use: No  . Drug Use: No  . Sexual Activity: Not on file   Other Topics Concern  . Not on file   Social History Narrative   He is retired from [employer].   Mother lived into her 43's.  Father died in his 24's.  No CAD.    Family History  Problem Relation Age of Onset  . Hypertension Other   . Heart disease Brother     CAD male 1st degree relative    REVIEW OF SYSTEMS:  All systems reviewed  Negative to the above problem except as noted above.    PHYSICAL EXAM: Filed Vitals:   03/20/14 1245  BP: 113/76  Pulse: 56  Temp:   Resp: 18    No intake or output data in the 24 hours ending 03/20/14 1317  General:  Well appearing. No respiratory difficulty HEENT: normal Neck: supple. no JVD. Carotids 2+ bilat; no bruits. No lymphadenopathy or thryomegaly appreciated. Cor: PMI nondisplaced. Irregular rate & rhythm. No rubs, gallops or murmurs. Lungs: clear Abdomen: soft, nontender, nondistended. No hepatosplenomegaly. No bruits or masses. Good bowel sounds. Extremities: no cyanosis, clubbing, rash, edema Neuro: alert & oriented x 3, cranial nerves grossly intact. moves all 4 extremities w/o difficulty. Affect pleasant.  ECG: Afib 94 bpm    Results for orders placed during the hospital encounter of 03/20/14 (from the past 24 hour(s))  CBC WITH DIFFERENTIAL     Status: Abnormal   Collection Time    03/20/14 11:11 AM      Result Value Ref Range   WBC 4.7  4.0 - 10.5 K/uL   RBC 4.24  4.22 - 5.81 MIL/uL   Hemoglobin 12.7 (*) 13.0 - 17.0 g/dL   HCT 38.2 (*) 39.0 - 52.0 %   MCV 90.1  78.0 - 100.0 fL   MCH 30.0  26.0 - 34.0 pg   MCHC 33.2  30.0 -  36.0 g/dL   RDW 23.2 (*) 11.5 - 15.5 %   Platelets 148 (*) 150 - 400 K/uL   Neutrophils Relative % 66  43 - 77 %   Lymphocytes Relative 21  12 - 46 %   Monocytes Relative 12  3 - 12 %   Eosinophils Relative 1  0 - 5 %   Basophils Relative 0  0 - 1 %   Neutro Abs 3.1  1.7 - 7.7 K/uL   Lymphs Abs 1.0  0.7 - 4.0 K/uL   Monocytes Absolute 0.6  0.1 - 1.0 K/uL   Eosinophils Absolute 0.0  0.0 - 0.7 K/uL   Basophils Absolute 0.0  0.0 - 0.1 K/uL   RBC Morphology BURR CELLS     WBC Morphology TOXIC GRANULATION    PRO B NATRIURETIC PEPTIDE     Status: Abnormal   Collection Time    03/20/14 11:11 AM      Result Value Ref Range   Pro B Natriuretic peptide (BNP) 2049.0 (*) 0 - 450 pg/mL  TROPONIN I     Status: None   Collection Time    03/20/14 11:11 AM      Result Value Ref Range   Troponin I <0.30  <0.30 ng/mL  COMPREHENSIVE METABOLIC PANEL     Status: Abnormal   Collection Time    03/20/14 11:11 AM      Result Value Ref Range   Sodium 140  137 - 147 mEq/L   Potassium 4.3  3.7 - 5.3 mEq/L   Chloride 107  96 - 112 mEq/L   CO2 22  19 - 32 mEq/L   Glucose, Bld 122 (*) 70 - 99 mg/dL   BUN 20  6 - 23 mg/dL   Creatinine, Ser 1.27  0.50 - 1.35 mg/dL   Calcium 8.5  8.4 - 10.5 mg/dL   Total Protein 5.9 (*) 6.0 - 8.3 g/dL   Albumin 3.1 (*) 3.5 - 5.2 g/dL   AST 19  0 - 37 U/L   ALT 14  0 - 53 U/L   Alkaline Phosphatase 63  39 - 117 U/L   Total Bilirubin 0.4  0.3 - 1.2 mg/dL   GFR calc non Af Amer 50 (*) >90 mL/min   GFR calc Af Amer 58 (*) >90 mL/min   Anion gap 11  5 - 15  PROTIME-INR  Status: Abnormal   Collection Time    03/20/14 11:11 AM      Result Value Ref Range   Prothrombin Time 24.7 (*) 11.6 - 15.2 seconds   INR 2.23 (*) 0.00 - 1.49  I-STAT TROPOININ, ED     Status: None   Collection Time    03/20/14 11:37 AM      Result Value Ref Range   Troponin i, poc 0.00  0.00 - 0.08 ng/mL   Comment 3            Dg Chest 2 View  03/20/2014   CLINICAL DATA:  Weakness, shortness of  breath, history hypertension, coronary artery disease  EXAM: CHEST  2 VIEW  COMPARISON:  06/09/2013  FINDINGS: Enlargement of cardiac silhouette.  Tortuous aorta.  Pulmonary vascularity mediastinal contours otherwise normal.  Mild chronic peribronchial thickening and LEFT basilar scarring.  No acute infiltrate, pleural effusion or pneumothorax.  Scattered mild endplate spur formation thoracic spine.  Faint probable RIGHT nipple shadow, also seen on 11/19/2009.  IMPRESSION: Enlargement of cardiac silhouette.  Minimal chronic bronchitic changes and LEFT basilar scarring.  No acute abnormalities.   Electronically Signed   By: Lavonia Dana M.D.   On: 03/20/2014 11:48     ASSESSMENT:  Patient is an 80 yo who presents with weakness, L arm, L chest pain. In atrial fibrillation  (Hx of PAF)  Rates controlled   L arm pain in similtar to what he had prior to coronary interventions. He has chronic fatigue which on review of chart is not new but he says is worse.  I am concerned that patient is having angina and would recomm L heart cath to define anatomy Unfortunately INR is 2.2 Plan:  Admit  Cycle enzymes.   Hold coumadin  Transioin to heparin. Follow heart rates and treat as needed   Plan for L heart cath on Tue to redefine anatomy Would give 1 dose lasix and follow response  Exam does not sugg signif volume overload but BNP is increased and he may feel better.  2.  CAD  As above  3.  Afib  Rates controlld    4.  Hypothytoid  Check TSH.

## 2014-03-20 NOTE — ED Provider Notes (Signed)
CSN: [id number]     Arrival date & time 03/20/14  1053 History   First MD Initiated Contact with Patient 03/20/14 1059     Chief Complaint  Patient presents with  . Weakness     (Consider location/radiation/quality/duration/timing/severity/associated sxs/prior Treatment) Patient is a 80 y.o. male presenting with weakness. The history is provided by the patient and medical records.  Weakness Associated symptoms include fatigue.   This is an 80 year old male with past medical history significant for hypertension, hyperlipidemia, coronary artery disease s/p stenting x4, paroxysmal A. fib anticoagulated on Coumadin, hypothyroidism, presenting to the ED for weakness. Patient states over the past couple days he has been having some generalized weakness and fatigue. States yesterday he developed some palpitations and increased weakness so was seen by PCP with EKG performed.  Was found to be in AFIB with RVR, EKG was faxed to patient's cardiology office and reviewed by Dr. Debara Pickett.  Patient was started on beta-blocker  (lopressor 12.5mg  BID).  States he took one dose and feels his heart rate did slow but has been feeling increasingly weak since then.  Denies chest pain, states some SOB and lightheadedness.  States he's been having intermittent left arm pain over the past several weeks.  Did have episode of left arm pain her this morning, however pain-free at present. Patient denies any recent cough, fever, chills.  Patient was given aspirin by EMS prior to arrival.  Patient does not use also recently started on iron supplementation for her chronic anemia. He denies any melena, hematochezia, or hematemesis. He had upper endoscopy and colonoscopy last month by Dr. Cristina Gong with [facility]-- both of which were unremarkable.  BP soft on arrival.  Past Medical History  Diagnosis Date  . HYPOTHYROIDISM 05/20/2007  . HYPERLIPIDEMIA 10/20/2008  . DEPRESSION 12/17/2007  . MITRAL REGURGITATION 10/20/2008  . HYPERTENSION  04/10/2007  . CORONARY ARTERY DISEASE     a. s/p stent OM 2002. b. s/p BMS 2006 RCA  c. s/p cath 2007.  [name] GERD 04/10/2007  . DIVERTICULOSIS, COLON 04/10/2007  . BENIGN PROSTATIC HYPERTROPHY 04/10/2007  . GYNECOMASTIA, UNILATERAL 03/23/2010  . SLEEP APNEA 05/20/2007  . WEAKNESS 11/09/2009  . Hematoma of abdominal wall   . Clotting disorder     anticoag. therapy  . Mallory - Weiss tear     > 5 years ago  . Barrett's esophagus   . Popliteal artery embolism, right   . History of echocardiogram     a. EF 55% (TEE 08/2011)  . Paroxysmal a-fib     a. chronic amiodarone and coumadin;  b. 05/2013 s/p DCCV.   Past Surgical History  Procedure Laterality Date  . Transurethral resection of prostate    . Coronary angioplasty with stent placement    . Tee with cardioversion  7/08  . Cardioversion  8/08  . Esophagogastroduodenoscopy    . Tee without cardioversion  09/14/2011    Procedure: TRANSESOPHAGEAL ECHOCARDIOGRAM (TEE);  Surgeon: Josue Hector, MD;  Location: [facility] ENDOSCOPY;  Service: Cardiovascular;  Laterality: N/A;  . Cardioversion  09/14/2011    Procedure: CARDIOVERSION;  Surgeon: Josue Hector, MD;  Location: [facility] ENDOSCOPY;  Service: Cardiovascular;  Laterality: N/A;   Family History  Problem Relation Age of Onset  . Hypertension Other   . Heart disease Brother     CAD male 1st degree relative   History  Substance Use Topics  . Smoking status: Never Smoker   . Smokeless tobacco: Never Used  . Alcohol Use: No  Review of Systems  Constitutional: Positive for fatigue.  All other systems reviewed and are negative.     Allergies  Review of patient's allergies indicates no known allergies.  Home Medications   Prior to Admission medications   Medication Sig Start Date End Date Taking? Authorizing Provider  amiodarone (PACERONE) 200 MG tablet Take 1 tablet (200 mg total) by mouth daily. 11/03/11 02/03/14  Minus Breeding, MD  clonazePAM (KLONOPIN) 1 MG tablet Take 1 mg by mouth at  bedtime.      Historical Provider, MD  DIGESTIVE ENZYMES PO Take 1 tablet by mouth every evening.     Historical Provider, MD  ferrous sulfate 325 (65 FE) MG tablet Take 325 mg by mouth 2 (two) times daily with a meal.    Historical Provider, MD  fish oil-omega-3 fatty acids 1000 MG capsule Take 3 g by mouth daily.    Historical Provider, MD  hydroxypropyl methylcellulose (ISOPTO TEARS) 2.5 % ophthalmic solution 1 drop.    Historical Provider, MD  levothyroxine (SYNTHROID, LEVOTHROID) 25 MCG tablet Take 25 mcg by mouth daily.     Historical Provider, MD  metoprolol tartrate (LOPRESSOR) 25 MG tablet Take 0.5 tablets (12.5 mg total) by mouth 2 (two) times daily. 03/19/14   [name], MD  Multiple Vitamin (MULTIVITAMIN) capsule Take 1 capsule by mouth daily.    Historical Provider, MD  nitroGLYCERIN (NITROSTAT) 0.4 MG SL tablet Place 0.4 mg under the tongue every 5 (five) minutes as needed for chest pain.     Historical Provider, MD  pantoprazole (PROTONIX) 40 MG tablet Take 40 mg by mouth daily.     Historical Provider, MD  pravastatin (PRAVACHOL) 40 MG tablet Take 40 mg by mouth every evening. 03/07/11   Imogene Burn, PA-C  PRESCRIPTION MEDICATION Place 1 drop into both eyes daily as needed (dry eyes).    Historical Provider, MD  traZODone (DESYREL) 50 MG tablet Take 1 to 2 pills at night as needed for sleep 10/10/13   Chesley Mires, MD  warfarin (COUMADIN) 5 MG tablet Take 1 tablet (5 mg total) by mouth every evening. 07/31/13   Minus Breeding, MD   BP 96/58  Pulse 75  Temp(Src) 97.7 F (36.5 C) (Oral)  Resp 22  Ht 5\' 11"  (1.803 m)  Wt 190 lb (86.183 kg)  BMI 26.51 kg/m2  SpO2 100%  Physical Exam  Nursing note and vitals reviewed. Constitutional: He is oriented to person, place, and time. He appears well-developed and well-nourished.  Appears fatigued  HENT:  Head: Normocephalic and atraumatic.  Mouth/Throat: Oropharynx is clear and moist.  Eyes: Conjunctivae and EOM are normal.  Pupils are equal, round, and reactive to light.  Neck: Normal range of motion.  Cardiovascular: Normal rate, regular rhythm and normal heart sounds.   Pulmonary/Chest: Effort normal and breath sounds normal.  Abdominal: Soft. Bowel sounds are normal.  Musculoskeletal: Normal range of motion.  Neurological: He is alert and oriented to person, place, and time.  Skin: Skin is warm and dry.  Psychiatric: He has a normal mood and affect.    ED Course  Procedures (including critical care time) Labs Review Labs Reviewed  CBC WITH DIFFERENTIAL - Abnormal; Notable for the following:    Hemoglobin 12.7 (*)    HCT 38.2 (*)    RDW 23.2 (*)    Platelets 148 (*)    All other components within normal limits  PRO B NATRIURETIC PEPTIDE - Abnormal; Notable for the following:  Pro B Natriuretic peptide (BNP) 2049.0 (*)    All other components within normal limits  COMPREHENSIVE METABOLIC PANEL - Abnormal; Notable for the following:    Glucose, Bld 122 (*)    Total Protein 5.9 (*)    Albumin 3.1 (*)    GFR calc non Af Amer 50 (*)    GFR calc Af Amer 58 (*)    All other components within normal limits  PROTIME-INR - Abnormal; Notable for the following:    Prothrombin Time 24.7 (*)    INR 2.23 (*)    All other components within normal limits  TROPONIN I  I-STAT TROPOININ, ED    Imaging Review Dg Chest 2 View  03/20/2014   CLINICAL DATA:  Weakness, shortness of breath, history hypertension, coronary artery disease  EXAM: CHEST  2 VIEW  COMPARISON:  06/09/2013  FINDINGS: Enlargement of cardiac silhouette.  Tortuous aorta.  Pulmonary vascularity mediastinal contours otherwise normal.  Mild chronic peribronchial thickening and LEFT basilar scarring.  No acute infiltrate, pleural effusion or pneumothorax.  Scattered mild endplate spur formation thoracic spine.  Faint probable RIGHT nipple shadow, also seen on 11/19/2009.  IMPRESSION: Enlargement of cardiac silhouette.  Minimal chronic bronchitic  changes and LEFT basilar scarring.  No acute abnormalities.   Electronically Signed   By: Lavonia Dana M.D.   On: 03/20/2014 11:48     EKG Interpretation None      MDM   Final diagnoses:  Other fatigue  Paroxysmal atrial fibrillation   80 year old male with weakness. He was seen by PCP yesterday, had EKG performed which was faxed to cardiology office. He was found to be in A. fib with RVR and started on Lopressor 12.5 mg BID. After taking medications, he noted control of his palpitations but increased weakness and fatigue making it difficult for him to even walk around his home. On arrival, blood pressure is soft and lower than his baseline which may be due to the Lopressor. He is afebrile and nontoxic appearing. He denies any chest pain at this time.  EKG AFIB, rate controlled at 94.  Lab work was obtained-- mildly elevated BNP ~2000.  CXR without pulmonary vascular congestion or pulmonary edema.  Coumadin is therapeutic.  Case discussed with cardiology, has evaluated in the ED and will admit for further management.  Larene Pickett, PA-C 03/20/14 1511

## 2014-03-20 NOTE — ED Notes (Signed)
Pt with increasing general weakness over last couple of days.  Pt was seen by PCP and told he was in "uncontrolled A-fib" and placed on beta blocker, pt reports he has been feeling worse since on new med.  Pt has had intermittent left arm pain, no sob.  Intermittent diaphoresis.  Pt has 20g in left hand from ems, 324mg  asa pta

## 2014-03-20 NOTE — Telephone Encounter (Signed)
Pt. Called and stated he was going to the Hospital and just wanted [place] to know, he stated he was feeling very weak and didn't feel like he was going to make it.

## 2014-03-21 DIAGNOSIS — E039 Hypothyroidism, unspecified: Secondary | ICD-10-CM | POA: Diagnosis not present

## 2014-03-21 DIAGNOSIS — Z7901 Long term (current) use of anticoagulants: Secondary | ICD-10-CM | POA: Diagnosis not present

## 2014-03-21 DIAGNOSIS — K219 Gastro-esophageal reflux disease without esophagitis: Secondary | ICD-10-CM | POA: Diagnosis not present

## 2014-03-21 DIAGNOSIS — I209 Angina pectoris, unspecified: Secondary | ICD-10-CM | POA: Diagnosis not present

## 2014-03-21 DIAGNOSIS — Z79899 Other long term (current) drug therapy: Secondary | ICD-10-CM | POA: Diagnosis not present

## 2014-03-21 DIAGNOSIS — Z9861 Coronary angioplasty status: Secondary | ICD-10-CM | POA: Diagnosis not present

## 2014-03-21 DIAGNOSIS — Z8249 Family history of ischemic heart disease and other diseases of the circulatory system: Secondary | ICD-10-CM | POA: Diagnosis not present

## 2014-03-21 DIAGNOSIS — I10 Essential (primary) hypertension: Secondary | ICD-10-CM | POA: Diagnosis not present

## 2014-03-21 DIAGNOSIS — G4733 Obstructive sleep apnea (adult) (pediatric): Secondary | ICD-10-CM

## 2014-03-21 DIAGNOSIS — R079 Chest pain, unspecified: Secondary | ICD-10-CM | POA: Diagnosis not present

## 2014-03-21 DIAGNOSIS — R5381 Other malaise: Secondary | ICD-10-CM | POA: Diagnosis not present

## 2014-03-21 DIAGNOSIS — N179 Acute kidney failure, unspecified: Secondary | ICD-10-CM | POA: Diagnosis not present

## 2014-03-21 DIAGNOSIS — I4891 Unspecified atrial fibrillation: Secondary | ICD-10-CM | POA: Diagnosis not present

## 2014-03-21 DIAGNOSIS — N400 Enlarged prostate without lower urinary tract symptoms: Secondary | ICD-10-CM | POA: Diagnosis present

## 2014-03-21 DIAGNOSIS — K5730 Diverticulosis of large intestine without perforation or abscess without bleeding: Secondary | ICD-10-CM | POA: Diagnosis present

## 2014-03-21 DIAGNOSIS — I2510 Atherosclerotic heart disease of native coronary artery without angina pectoris: Secondary | ICD-10-CM | POA: Diagnosis not present

## 2014-03-21 DIAGNOSIS — I059 Rheumatic mitral valve disease, unspecified: Secondary | ICD-10-CM | POA: Diagnosis not present

## 2014-03-21 DIAGNOSIS — I498 Other specified cardiac arrhythmias: Secondary | ICD-10-CM | POA: Diagnosis not present

## 2014-03-21 DIAGNOSIS — E785 Hyperlipidemia, unspecified: Secondary | ICD-10-CM | POA: Diagnosis present

## 2014-03-21 LAB — BASIC METABOLIC PANEL
Anion gap: 12 (ref 5–15)
BUN: 20 mg/dL (ref 6–23)
CALCIUM: 8.9 mg/dL (ref 8.4–10.5)
CO2: 28 mEq/L (ref 19–32)
Chloride: 102 mEq/L (ref 96–112)
Creatinine, Ser: 1.5 mg/dL — ABNORMAL HIGH (ref 0.50–1.35)
GFR calc non Af Amer: 41 mL/min — ABNORMAL LOW (ref >90)
GFR, EST AFRICAN AMERICAN: 48 mL/min — AB (ref >90)
Glucose, Bld: 111 mg/dL — ABNORMAL HIGH (ref 70–99)
Potassium: 4.1 mEq/L (ref 3.7–5.3)
Sodium: 142 mEq/L (ref 137–147)

## 2014-03-21 LAB — TROPONIN I
Troponin I: <0.3 ng/mL (ref <0.30)
Troponin I: <0.3 ng/mL (ref <0.30)

## 2014-03-21 LAB — PROTIME-INR
INR: 2.17 — AB (ref 0.00–1.49)
Prothrombin Time: 24.2 seconds — ABNORMAL HIGH (ref 11.6–15.2)

## 2014-03-21 MED ORDER — AMIODARONE HCL 200 MG PO TABS
200.0000 mg | ORAL_TABLET | Freq: Every day | ORAL | Status: DC
  Administered 2014-03-21 – 2014-03-24 (×4): 200 mg via ORAL
  Filled 2014-03-21 (×4): qty 1

## 2014-03-21 MED ORDER — TRAZODONE HCL 50 MG PO TABS
50.0000 mg | ORAL_TABLET | Freq: Every evening | ORAL | Status: DC | PRN
  Administered 2014-03-21 – 2014-03-23 (×3): 50 mg via ORAL
  Filled 2014-03-21 (×3): qty 2

## 2014-03-21 MED ORDER — FERROUS SULFATE 325 (65 FE) MG PO TABS
325.0000 mg | ORAL_TABLET | Freq: Two times a day (BID) | ORAL | Status: DC
  Administered 2014-03-22 – 2014-03-24 (×4): 325 mg via ORAL
  Filled 2014-03-21 (×8): qty 1

## 2014-03-21 MED ORDER — METOPROLOL TARTRATE 12.5 MG HALF TABLET
12.5000 mg | ORAL_TABLET | Freq: Two times a day (BID) | ORAL | Status: DC
  Filled 2014-03-21 (×4): qty 1

## 2014-03-21 MED ORDER — HYPROMELLOSE (GONIOSCOPIC) 2.5 % OP SOLN
1.0000 [drp] | Freq: Three times a day (TID) | OPHTHALMIC | Status: DC | PRN
  Filled 2014-03-21: qty 15

## 2014-03-21 MED ORDER — LEVOTHYROXINE SODIUM 25 MCG PO TABS
25.0000 ug | ORAL_TABLET | Freq: Every day | ORAL | Status: DC
  Administered 2014-03-22 – 2014-03-24 (×3): 25 ug via ORAL
  Filled 2014-03-21 (×6): qty 1

## 2014-03-21 MED ORDER — PANTOPRAZOLE SODIUM 40 MG PO TBEC
40.0000 mg | DELAYED_RELEASE_TABLET | Freq: Two times a day (BID) | ORAL | Status: DC
  Administered 2014-03-21 – 2014-03-24 (×6): 40 mg via ORAL
  Filled 2014-03-21 (×6): qty 1

## 2014-03-21 MED ORDER — REGADENOSON 0.4 MG/5ML IV SOLN
0.4000 mg | Freq: Once | INTRAVENOUS | Status: AC
  Administered 2014-03-22: 0.4 mg via INTRAVENOUS
  Filled 2014-03-21: qty 5

## 2014-03-21 MED ORDER — SIMVASTATIN 10 MG PO TABS
10.0000 mg | ORAL_TABLET | Freq: Every day | ORAL | Status: DC
  Administered 2014-03-21 – 2014-03-23 (×3): 10 mg via ORAL
  Filled 2014-03-21 (×4): qty 1

## 2014-03-21 MED ORDER — CLONAZEPAM 0.5 MG PO TABS
1.0000 mg | ORAL_TABLET | Freq: Every day | ORAL | Status: DC
Start: 2014-03-21 — End: 2014-03-24
  Administered 2014-03-21 – 2014-03-23 (×3): 1 mg via ORAL
  Filled 2014-03-21 (×3): qty 2

## 2014-03-21 NOTE — Progress Notes (Signed)
SUBJECTIVE: The patient is doing reasonably today.  He denies arm pain.  His primary concern is with fatigue which has been a chronic issue.  At this time, he denies chest pain, shortness of breath, or any new concerns.  [name] aspirin  324 mg Oral NOW   Or  . aspirin  300 mg Rectal NOW  . aspirin EC  81 mg Oral Daily  . [START ON 03/22/2014] regadenoson  0.4 mg Intravenous Once  . sodium chloride  3 mL Intravenous Q12H      OBJECTIVE: Physical Exam: Filed Vitals:   03/20/14 2300 03/21/14 0300 03/21/14 0802 03/21/14 0803  BP: 119/72 124/75  110/72  Pulse: 106 102 61 102  Temp: 98 F (36.7 C) 97.3 F (36.3 C)  98 F (36.7 C)  TempSrc: Oral Oral  Other (Comment)  Resp: 18 28 10 7   Height:      Weight:      SpO2: 96% 91% 97% 97%    Intake/Output Summary (Last 24 hours) at 03/21/14 1044 Last data filed at 03/21/14 0300  Gross per 24 hour  Intake    220 ml  Output   2250 ml  Net  -2030 ml    Telemetry reveals rate controlled afib  GEN- The patient is elderly appearing, alert and oriented x 3 today.   Head- normocephalic, atraumatic Eyes-  Sclera clear, conjunctiva pink Ears- hearing intact Oropharynx- clear Neck- supple,  Lungs- Clear to ausculation bilaterally, normal work of breathing Heart- irregular rate and rhythm, 2/6 SEM GI- soft, NT, ND, + BS Extremities- no clubbing, cyanosis, or edema Skin- no rash or lesion Psych- euthymic mood, full affect Neuro- strength and sensation are intact  LABS: Basic Metabolic Panel:  Recent Labs  03/20/14 1111 03/21/14 0046  NA 140 142  K 4.3 4.1  CL 107 102  CO2 22 28  GLUCOSE 122* 111*  BUN 20 20  CREATININE 1.27 1.50*  CALCIUM 8.5 8.9   Liver Function Tests:  Recent Labs  03/20/14 1111  AST 19  ALT 14  ALKPHOS 63  BILITOT 0.4  PROT 5.9*  ALBUMIN 3.1*   No results found for this basename: LIPASE, AMYLASE,  in the last 72 hours CBC:  Recent Labs  03/20/14 1111  WBC 4.7  NEUTROABS 3.1  HGB 12.7*    HCT 38.2*  MCV 90.1  PLT 148*   Cardiac Enzymes:  Recent Labs  03/20/14 1922 03/21/14 0046 03/21/14 0645  TROPONINI <0.30 <0.30 <0.30   RADIOLOGY: Dg Chest 2 View  03/20/2014   CLINICAL DATA:  Weakness, shortness of breath, history hypertension, coronary artery disease  EXAM: CHEST  2 VIEW  COMPARISON:  06/09/2013  FINDINGS: Enlargement of cardiac silhouette.  Tortuous aorta.  Pulmonary vascularity mediastinal contours otherwise normal.  Mild chronic peribronchial thickening and LEFT basilar scarring.  No acute infiltrate, pleural effusion or pneumothorax.  Scattered mild endplate spur formation thoracic spine.  Faint probable RIGHT nipple shadow, also seen on 11/19/2009.  IMPRESSION: Enlargement of cardiac silhouette.  Minimal chronic bronchitic changes and LEFT basilar scarring.  No acute abnormalities.   Electronically Signed   By: Lavonia Dana M.D.   On: 03/20/2014 11:48    ASSESSMENT AND PLAN:   1. Fatigue/ weakness Unclear of the etiology Chronic issues Ischemia seems less likely afib seems more likely Will check TFTs  2. Arm pain- atypical for ischemia Given advanced age, I am reluctant to proceed direction to cath.  Will order lexiscan for tomorrow.  IF  low risk, would treat medically  3. CAD Continue beta blocker Per son, he has not tolerate medical therapy well in the past due to low BP myoview as above Has ruled out for MI  4. afib Coumadin is on hold but INRs have been therapeutic for over 4 weeks.  If myoview is low risk, would proceed with cardioversion (hopefully over the weekend).  Resume coumadin if low risk myoview tomorrow Would need heparin bridging if INR < 2.  5. OSA CPAP  Transfer to telemetry today  Thompson Grayer, MD 03/21/2014 10:44 AM

## 2014-03-21 NOTE — Progress Notes (Signed)
Patient came to the floor around 1400, no c/o pain or dizziness, v/s stable. Patient reuse metoprolol, pt. Stated he took med on Thursday the medication make him weak. Card PA notified, no new order given. Will continue to monitor.

## 2014-03-21 NOTE — ED Provider Notes (Signed)
Medical screening examination/treatment/procedure(s) were conducted as a shared visit with non-physician practitioner(s) and myself.  I personally evaluated the patient during the encounter.   EKG Interpretation None      I performed an examination on the patient including cardiac, pulmonary, and gi systems which were unremarkable.  Pt admitted to cardiology with generalized weakness and hypotension in setting of increased beta blockade.   Debby Freiberg, MD 03/21/14 [phone]

## 2014-03-21 NOTE — Progress Notes (Signed)
UR completed 

## 2014-03-22 ENCOUNTER — Inpatient Hospital Stay (HOSPITAL_COMMUNITY): Payer: Medicare Other

## 2014-03-22 DIAGNOSIS — R079 Chest pain, unspecified: Secondary | ICD-10-CM

## 2014-03-22 LAB — HEPARIN LEVEL (UNFRACTIONATED): Heparin Unfractionated: <0.1 IU/mL — ABNORMAL LOW (ref 0.30–0.70)

## 2014-03-22 LAB — PROTIME-INR
INR: 1.89 — ABNORMAL HIGH (ref 0.00–1.49)
Prothrombin Time: 21.7 seconds — ABNORMAL HIGH (ref 11.6–15.2)

## 2014-03-22 LAB — TSH: TSH: 5.27 u[IU]/mL — ABNORMAL HIGH (ref 0.350–4.500)

## 2014-03-22 MED ORDER — TECHNETIUM TC 99M SESTAMIBI - CARDIOLITE
30.0000 | Freq: Once | INTRAVENOUS | Status: AC | PRN
  Administered 2014-03-22: 11:00:00 30 via INTRAVENOUS

## 2014-03-22 MED ORDER — WARFARIN - PHARMACIST DOSING INPATIENT: Freq: Every day | Status: DC

## 2014-03-22 MED ORDER — WARFARIN SODIUM 5 MG PO TABS
5.0000 mg | ORAL_TABLET | Freq: Once | ORAL | Status: AC
  Administered 2014-03-22: 5 mg via ORAL
  Filled 2014-03-22: qty 1

## 2014-03-22 MED ORDER — DILTIAZEM HCL 30 MG PO TABS
30.0000 mg | ORAL_TABLET | Freq: Four times a day (QID) | ORAL | Status: DC
  Administered 2014-03-22 (×2): 30 mg via ORAL
  Filled 2014-03-22 (×5): qty 1

## 2014-03-22 MED ORDER — TECHNETIUM TC 99M SESTAMIBI GENERIC - CARDIOLITE
10.0000 | Freq: Once | INTRAVENOUS | Status: AC | PRN
  Administered 2014-03-22: 10 via INTRAVENOUS

## 2014-03-22 MED ORDER — HEPARIN (PORCINE) IN NACL 100-0.45 UNIT/ML-% IJ SOLN
1250.0000 [IU]/h | INTRAMUSCULAR | Status: DC
  Administered 2014-03-22: 800 [IU]/h via INTRAVENOUS
  Administered 2014-03-22: 1100 [IU]/h via INTRAVENOUS
  Administered 2014-03-23: 1300 [IU]/h via INTRAVENOUS
  Administered 2014-03-24: 1250 [IU]/h via INTRAVENOUS
  Administered 2014-03-24: 1300 [IU]/h via INTRAVENOUS
  Filled 2014-03-22 (×5): qty 250

## 2014-03-22 NOTE — Progress Notes (Signed)
Subjective: He reports feeling "washed out"  Objective: Vital signs in last 24 hours: Temp:  [97.3 F (36.3 C)-98.4 F (36.9 C)] 97.3 F (36.3 C) (09/06 0626) Pulse Rate:  [72-102] 81 (09/06 0937) Resp:  [18-24] 20 (09/06 0626) BP: (95-133)/(70-91) 118/82 mmHg (09/06 0937) SpO2:  [95 %-99 %] 99 % (09/06 0626) Weight:  [186 lb 1.1 oz (84.4 kg)] 186 lb 1.1 oz (84.4 kg) (09/06 0626) Last BM Date: 03/21/14  Intake/Output from previous day: 09/05 0701 - 09/06 0700 In: 600 [P.O.:600] Out: 600 [Urine:600] Intake/Output this shift:    Medications Current Facility-Administered Medications  Medication Dose Route Frequency Provider Last Rate Last Dose  . 0.9 %  sodium chloride infusion  250 mL Intravenous PRN Fay Records, MD      . acetaminophen (TYLENOL) tablet 650 mg  650 mg Oral Q4H PRN Fay Records, MD      . amiodarone (PACERONE) tablet 200 mg  200 mg Oral Daily Thompson Grayer, MD   200 mg at 03/21/14 1442  . aspirin EC tablet 81 mg  81 mg Oral Daily Fay Records, MD   81 mg at 03/21/14 1045  . clonazePAM (KLONOPIN) tablet 1 mg  1 mg Oral QHS Thompson Grayer, MD   1 mg at 03/21/14 2248  . ferrous sulfate tablet 325 mg  325 mg Oral BID WC Thompson Grayer, MD   325 mg at 03/22/14 3790  . hydroxypropyl methylcellulose / hypromellose (ISOPTO TEARS / GONIOVISC) 2.5 % ophthalmic solution 1 drop  1 drop Both Eyes TID PRN Thompson Grayer, MD      . levothyroxine (SYNTHROID, LEVOTHROID) tablet 25 mcg  25 mcg Oral QAC breakfast Thompson Grayer, MD   25 mcg at 03/22/14 2409  . metoprolol tartrate (LOPRESSOR) tablet 12.5 mg  12.5 mg Oral BID Thompson Grayer, MD      . nitroGLYCERIN (NITROSTAT) SL tablet 0.4 mg  0.4 mg Sublingual Q5 Min x 3 PRN Fay Records, MD      . ondansetron [facility]) injection 4 mg  4 mg Intravenous Q6H PRN Fay Records, MD      . pantoprazole (PROTONIX) EC tablet 40 mg  40 mg Oral BID WC Thompson Grayer, MD   40 mg at 03/22/14 0656  . simvastatin (ZOCOR) tablet 10 mg  10 mg Oral q1800  Thompson Grayer, MD   10 mg at 03/21/14 1754  . sodium chloride 0.9 % injection 3 mL  3 mL Intravenous Q12H Fay Records, MD   3 mL at 03/21/14 2249  . sodium chloride 0.9 % injection 3 mL  3 mL Intravenous PRN Fay Records, MD      . technetium sestamibi (CARDIOLITE) injection 30 milli Curie  30 milli Curie Intravenous Once PRN Medication Radiologist, MD      . traZODone (DESYREL) tablet 50-100 mg  50-100 mg Oral QHS PRN Thompson Grayer, MD   50 mg at 03/21/14 2249    PE: General appearance: alert, cooperative and no distress Lungs: clear to auscultation bilaterally Heart: irregularly irregular rhythm and No MRG Extremities: No LEE Pulses: 2+ and symmetric Skin: Warm and dry Neurologic: Grossly normal  Lab Results:   Recent Labs  03/20/14 1111  WBC 4.7  HGB 12.7*  HCT 38.2*  PLT 148*   BMET  Recent Labs  03/20/14 1111 03/21/14 0046  NA 140 142  K 4.3 4.1  CL 107 102  CO2 22 28  GLUCOSE 122* 111*  BUN 20  20  CREATININE 1.27 1.50*  CALCIUM 8.5 8.9   PT/INR  Recent Labs  03/20/14 1111 03/21/14 0046 03/22/14 0344  LABPROT 24.7* 24.2* 21.7*  INR 2.23* 2.17* 1.89*   Cardiac Panel (last 3 results)  Recent Labs  03/20/14 1922 03/21/14 0046 03/21/14 0645  TROPONINI <0.30 <0.30 <0.30      Assessment/Plan   Active Problems:   Atrial fibrillation   Chest pain  1. Fatigue/ weakness  Unclear of the etiology  Chronic issues  Ischemia seems less likely  afib seems more likely  LFTs WNL TSH mildly elevated. Better than last Nov.  He is hypothyroid.  I do not think this explains his fatigue.  Normal WBCs May need echo if EF from [place] is low.  No murmur on exam. 2. Arm pain- atypical for ischemia  lexiscan completed today.  Results to follow. If low risk, would treat medically  3. CAD  Continue beta blocker  Per son, he has not tolerate medical therapy well in the past due to low BP  myoview as above  Has ruled out for MI  4. afib  Coumadin is on hold  but INRs have been therapeutic for over 4 weeks. If myoview is low risk, would proceed with cardioversion (hopefully over the weekend). Resume coumadin if low risk myoview tomorrow  INR 1.89 start IV heparin 5. OSA  CPAP 6.  AKI  SCr increased to 1.50.  BUN stable at 20.  Continue  to monitor.   LOS: 2 days    HAGER, BRYAN PA-C 03/22/2014 9:43 AM  10:46AM............ patient is off the floor at this time. Nuclear scan data is pending for today. He has had some diuresis since his admission. Initial BNP was elevated. There has been a rise in creatinine. This may be related to diuresis. The patient is on a very small dose of thyroid replacement. TSH is mildly elevated. I will increase the dose. When we know the results of the nuclear study today, we will decide if catheterization is needed. If not, Coumadin will be resumed. Decision will then be made as to whether or not he can be cardioverted tomorrow. I will make him n.p.o. for tomorrow.

## 2014-03-22 NOTE — Progress Notes (Signed)
ANTICOAGULATION CONSULT NOTE - Follow Up Consult  Pharmacy Consult for Heparin and Coumadin Indication: atrial fibrillation  No Known Allergies  Patient Measurements: Height: 6' (182.9 cm) Weight: 186 lb 1.1 oz (84.4 kg) (B SCALE) IBW/kg (Calculated) : 77.6 Heparin Dosing Weight: 84.4kg  Vital Signs: Temp: 97.8 F (36.6 C) (09/06 1400) Temp src: Oral (09/06 1400) BP: 103/65 mmHg (09/06 1521) Pulse Rate: 93 (09/06 1400)  Labs:  Recent Labs  03/20/14 1111 03/20/14 1922 03/21/14 0046 03/21/14 0645 03/22/14 0344 03/22/14 1818  HGB 12.7*  --   --   --   --   --   HCT 38.2*  --   --   --   --   --   PLT 148*  --   --   --   --   --   LABPROT 24.7*  --  24.2*  --  21.7*  --   INR 2.23*  --  2.17*  --  1.89*  --   HEPARINUNFRC  --   --   --   --   --  <0.10*  CREATININE 1.27  --  1.50*  --   --   --   TROPONINI <0.30 <0.30 <0.30 <0.30  --   --     Estimated Creatinine Clearance: 40.2 ml/min (by C-G formula based on Cr of 1.5).   Medications:  Heparin 800 units/hr  Assessment: 86yom on Coumadin PTA for Afib. Coumadin was placed on hold for possible procedure and heparin was started 9/6 AM once INR trended < 2. Patient has low risk myoview today and Cardiology has now consulted pharmacy to restart Coumadin tonight and patient will have cardioversion tomorrow. INR has trended down to subtherapeutic levels (1.89) since Coumadin has been on hold - will give increased dose tonight and follow-up AM INR. Heparin level (<0.1) is subtherapeutic - will increase rate and check follow-up level. - H/H and Plts low - No significant bleeding reported - No problems with line/infusion per RN - PTA Coumadin regimen: 2.5mg  daily except 5mg  Mon/Thur/Sat  Goal of Therapy:  INR 2-3 Heparin level 0.3-0.7 units/ml Monitor platelets by anticoagulation protocol: Yes   Plan:  1. Increase heparin drip to 1100 units/hr (11 ml/hr) 2. Check heparin level 8 hours after rate increase  3. Coumadin  5mg  PO x 1 today  4. Daily CBC, heparin level and CBC  Earleen Newport [phone] 03/22/2014,7:09 PM

## 2014-03-22 NOTE — Progress Notes (Signed)
Patient alert and oriented, no c/o pain, or dizziness. BP is soft, pt c/o of fatigue, patient went to nuclear med for stress test. Will continue to monitor patient.

## 2014-03-22 NOTE — Progress Notes (Addendum)
ANTICOAGULATION CONSULT NOTE - Initial Consult  Pharmacy Consult for Heparin (bridge while off coumadin) Indication: atrial fibrillation  H/o PAF- on chronic coumadin prior to admit  No Known Allergies  Patient Measurements: Height: 6' (182.9 cm) Weight: 186 lb 1.1 oz (84.4 kg) (B SCALE) IBW/kg (Calculated) : 77.6 Heparin Dosing Weight: 84.4 kg  Vital Signs: Temp: 97.3 F (36.3 C) (09/06 0626) Temp src: Oral (09/06 0626) BP: 105/70 mmHg (09/06 0958) Pulse Rate: 100 (09/06 0958)  Labs:  Recent Labs  03/20/14 1111 03/20/14 1922 03/21/14 0046 03/21/14 0645 03/22/14 0344  HGB 12.7*  --   --   --   --   HCT 38.2*  --   --   --   --   PLT 148*  --   --   --   --   LABPROT 24.7*  --  24.2*  --  21.7*  INR 2.23*  --  2.17*  --  1.89*  CREATININE 1.27  --  1.50*  --   --   TROPONINI <0.30 <0.30 <0.30 <0.30  --     Estimated Creatinine Clearance: 40.2 ml/min (by C-G formula based on Cr of 1.5).   Medical History: Past Medical History  Diagnosis Date  . HYPOTHYROIDISM 05/20/2007  . HYPERLIPIDEMIA 10/20/2008  . DEPRESSION 12/17/2007  . MITRAL REGURGITATION 10/20/2008  . HYPERTENSION 04/10/2007  . CORONARY ARTERY DISEASE     a. s/p stent OM 2002. b. s/p BMS 2006 RCA  c. s/p cath 2007.  [name] GERD 04/10/2007  . DIVERTICULOSIS, COLON 04/10/2007  . BENIGN PROSTATIC HYPERTROPHY 04/10/2007  . GYNECOMASTIA, UNILATERAL 03/23/2010  . SLEEP APNEA 05/20/2007  . WEAKNESS 11/09/2009  . Hematoma of abdominal wall   . Clotting disorder     anticoag. therapy  . Mallory - Weiss tear     > 5 years ago  . Barrett's esophagus   . Popliteal artery embolism, right   . History of echocardiogram     a. EF 55% (TEE 08/2011)  . Paroxysmal a-fib     a. chronic amiodarone and coumadin;  b. 05/2013 s/p DCCV.    Medications:  Prescriptions prior to admission  Medication Sig Dispense Refill  . amiodarone (PACERONE) 200 MG tablet Take 200 mg by mouth daily.      . clonazePAM (KLONOPIN) 1 MG tablet Take 1  mg by mouth at bedtime.        [name] DIGESTIVE ENZYMES PO Take 1 tablet by mouth every evening.       . ferrous sulfate 325 (65 FE) MG tablet Take 325 mg by mouth 2 (two) times daily with a meal.      . fish oil-omega-3 fatty acids 1000 MG capsule Take 1 g by mouth 2 (two) times daily.       . hydroxypropyl methylcellulose (ISOPTO TEARS) 2.5 % ophthalmic solution Place 1 drop into both eyes 3 (three) times daily as needed for dry eyes.       [name] levothyroxine (SYNTHROID, LEVOTHROID) 25 MCG tablet Take 25 mcg by mouth daily.       . metoprolol tartrate (LOPRESSOR) 25 MG tablet Take 0.5 tablets (12.5 mg total) by mouth 2 (two) times daily.  30 tablet  6  . Multiple Vitamin (MULTIVITAMIN) capsule Take 1 capsule by mouth daily.      . nitroGLYCERIN (NITROSTAT) 0.4 MG SL tablet Place 0.4 mg under the tongue every 5 (five) minutes as needed for chest pain.       . pantoprazole (PROTONIX)  40 MG tablet Take 40 mg by mouth 2 (two) times daily.       . pravastatin (PRAVACHOL) 40 MG tablet Take 40 mg by mouth every evening.      . traZODone (DESYREL) 50 MG tablet Take 50-100 mg by mouth at bedtime as needed for sleep.      [name] warfarin (COUMADIN) 5 MG tablet Take 2.5-5 mg by mouth daily. 5 mg tue, thur, sat and 2.5 mg all other days        Assessment: 80 y.o male admitted 03/20/14 with complaint of  weakness, L arm, L chest pain. Admitted with afib w/RVR. History of HTN, CAD (s/p PCI/stent x 4), PAF and hypothyroid. He was on coumadin prior to admission for h/o PAF. Coumadin held. Last dose taken PTA on 03/19/14.  INR = 1.89 this AM.  Pharmacy consulted to start IV heparin for Afib now that INR is <2.  CBC within normal limits on 03/20/14. No bleeding noted.  PTA coumadin dose was 5 mg qTTSat and 2.5 mg  QMWFSun- last taken 03/19/14. Also on amiodarone PTA.  Goal of Therapy:  Heparin level 0.3-0.7 units/ml Monitor platelets by anticoagulation protocol: Yes   Plan:  Start IV heparin (no bolus) at rate of 800  units/hr Heparin level in 6 hours Daily heparin level and CBC  Nicole Cella, RPh Clinical Pharmacist Pager: [phone] 03/22/2014,10:06 AM

## 2014-03-22 NOTE — Progress Notes (Signed)
Patient Name: [name]      SUBJECTIVE: admitted with AF RVR with symptoms of fatigue and chest pain worrisome for angina>>cath anticipated but changed to Lexi given age  R/o for MI  Echo n 2012: LVEF 55 to 60^ with mod LVH  His last catheter in 2007 showed a 40% tubular lesion in the ostium of the LAD, 50-60% mid LAD, 70% diagonal, 30% circumflex, 40% OM, RCA dominant. The stent in the RCA had 30% mid and the distal stent had 50-60% in-stent restenosis. PDA had 40% stenosis he had moderate mitral regurgitation and preserved LV function  withoug sob or cp this am   Past Medical History  Diagnosis Date  . HYPOTHYROIDISM 05/20/2007  . HYPERLIPIDEMIA 10/20/2008  . DEPRESSION 12/17/2007  . MITRAL REGURGITATION 10/20/2008  . HYPERTENSION 04/10/2007  . CORONARY ARTERY DISEASE     a. s/p stent OM 2002. b. s/p BMS 2006 RCA  c. s/p cath 2007.  [name] GERD 04/10/2007  . DIVERTICULOSIS, COLON 04/10/2007  . BENIGN PROSTATIC HYPERTROPHY 04/10/2007  . GYNECOMASTIA, UNILATERAL 03/23/2010  . SLEEP APNEA 05/20/2007  . WEAKNESS 11/09/2009  . Hematoma of abdominal wall   . Clotting disorder     anticoag. therapy  . Mallory - Weiss tear     > 5 years ago  . Barrett's esophagus   . Popliteal artery embolism, right   . History of echocardiogram     a. EF 55% (TEE 08/2011)  . Paroxysmal a-fib     a. chronic amiodarone and coumadin;  b. 05/2013 s/p DCCV.    Scheduled Meds:  Scheduled Meds: . amiodarone  200 mg Oral Daily  . aspirin EC  81 mg Oral Daily  . clonazePAM  1 mg Oral QHS  . ferrous sulfate  325 mg Oral BID WC  . levothyroxine  25 mcg Oral QAC breakfast  . metoprolol tartrate  12.5 mg Oral BID  . pantoprazole  40 mg Oral BID WC  . simvastatin  10 mg Oral q1800  . sodium chloride  3 mL Intravenous Q12H   Continuous Infusions:  sodium chloride, acetaminophen, hydroxypropyl methylcellulose / hypromellose, nitroGLYCERIN, ondansetron (ZOFRAN) IV, sodium chloride, technetium sestamibi,  traZODone    PHYSICAL EXAM Filed Vitals:   03/21/14 2121 03/22/14 0008 03/22/14 0126 03/22/14 0626  BP: 131/82  105/78 95/82  Pulse: 89 88 72 73  Temp: 97.9 F (36.6 C)  97.6 F (36.4 C) 97.3 F (36.3 C)  TempSrc: Oral  Oral Oral  Resp: 20 18 20 20   Height:      Weight:    186 lb 1.1 oz (84.4 kg)  SpO2: 99%  99% 99%    Well developed and nourished in no acute distress HENT normal Neck supple with JVP-flat Carotids brisk and full without bruits Clear Irregularly irregular rate and rhythm with controlled ventricular response, no murmurs or gallops Abd-soft with active BS without hepatomegaly No Clubbing cyanosis edema Skin-warm and dry A & Oriented  Grossly normal sensory and motor function   TELEMETRY: Reviewed telemetry pt in afib CVR   Intake/Output Summary (Last 24 hours) at 03/22/14 0909 Last data filed at 03/22/14 0851  Gross per 24 hour  Intake    420 ml  Output    400 ml  Net     20 ml    LABS: Basic Metabolic Panel:  Recent Labs Lab 03/20/14 1111 03/21/14 0046  NA 140 142  K 4.3 4.1  CL 107 102  CO2 22 28  GLUCOSE 122* 111*  BUN 20 20  CREATININE 1.27 1.50*  CALCIUM 8.5 8.9   Cardiac Enzymes:  Recent Labs  03/20/14 1922 03/21/14 0046 03/21/14 0645  TROPONINI <0.30 <0.30 <0.30   CBC:  Recent Labs Lab 03/20/14 1111  WBC 4.7  NEUTROABS 3.1  HGB 12.7*  HCT 38.2*  MCV 90.1  PLT 148*   PROTIME:  Recent Labs  03/20/14 1111 03/21/14 0046 03/22/14 0344  LABPROT 24.7* 24.2* 21.7*  INR 2.23* 2.17* 1.89*   Liver Function Tests:  Recent Labs  03/20/14 1111  AST 19  ALT 14  ALKPHOS 63  BILITOT 0.4  PROT 5.9*  ALBUMIN 3.1*   No results found for this basename: LIPASE, AMYLASE,  in the last 72 hours BNP: BNP (last 3 results)  Recent Labs  06/09/13 1305 03/20/14 1111  PROBNP 856.7* 2049.0*   D-Dimer: No results found for this basename: DDIMER,  in the last 72 hours Hemoglobin A1C: No results found for this  basename: HGBA1C,  in the last 72 hours Fasting Lipid Panel: No results found for this basename: CHOL, HDL, LDLCALC, TRIG, CHOLHDL, LDLDIRECT,  in the last 72 hours Thyroid Function Tests:  Recent Labs  03/22/14 0344  TSH 5.270*   Anemia Panel: No results found for this basename: VITAMINB12, FOLATE, FERRITIN, TIBC, IRON, RETICCTPCT,  in the last 72 hours  ASSESSMENT AND PLAN:  Active Problems:   Atrial fibrillation   Chest pain  INR subtherapeutic,  Heparin initiated  If Myoviwe low risk>> resume coumadin and anticipate DCCV on Monday ; if highrisk hold coumadin and plan cath tues   Will make NPO once we see myoview result  Signed, Virl Axe MD  03/22/2014

## 2014-03-22 NOTE — Progress Notes (Signed)
Low risk myoview Will resume coumadin (currently on IV heparin) Plan for cardioversion tomorrow am  Npo x meds after midnight tonight

## 2014-03-23 ENCOUNTER — Encounter (HOSPITAL_COMMUNITY): Payer: Self-pay | Admitting: Anesthesiology

## 2014-03-23 ENCOUNTER — Encounter (HOSPITAL_COMMUNITY)
Admission: EM | Disposition: A | Discharge status: Home / Self Care | Payer: Self-pay | Source: Home / Self Care | Attending: Cardiology

## 2014-03-23 ENCOUNTER — Encounter (HOSPITAL_COMMUNITY): Payer: Medicare Other | Admitting: Anesthesiology

## 2014-03-23 ENCOUNTER — Inpatient Hospital Stay (HOSPITAL_COMMUNITY): Payer: Medicare Other | Admitting: Anesthesiology

## 2014-03-23 DIAGNOSIS — E039 Hypothyroidism, unspecified: Secondary | ICD-10-CM

## 2014-03-23 HISTORY — PX: CARDIOVERSION: SHX1299

## 2014-03-23 LAB — CBC
HCT: 38.1 % — ABNORMAL LOW (ref 39.0–52.0)
Hemoglobin: 12.4 g/dL — ABNORMAL LOW (ref 13.0–17.0)
MCH: 29.5 pg (ref 26.0–34.0)
MCHC: 32.5 g/dL (ref 30.0–36.0)
MCV: 90.5 fL (ref 78.0–100.0)
PLATELETS: 153 10*3/uL (ref 150–400)
RBC: 4.21 MIL/uL — ABNORMAL LOW (ref 4.22–5.81)
RDW: 22.5 % — AB (ref 11.5–15.5)
WBC: 6.1 10*3/uL (ref 4.0–10.5)

## 2014-03-23 LAB — PROTIME-INR
INR: 1.93 — AB (ref 0.00–1.49)
PROTHROMBIN TIME: 22.1 s — AB (ref 11.6–15.2)

## 2014-03-23 LAB — HEPARIN LEVEL (UNFRACTIONATED)
HEPARIN UNFRACTIONATED: 0.13 [IU]/mL — AB (ref 0.30–0.70)
Heparin Unfractionated: 0.57 IU/mL (ref 0.30–0.70)

## 2014-03-23 SURGERY — CARDIOVERSION
Anesthesia: Monitor Anesthesia Care

## 2014-03-23 MED ORDER — WARFARIN SODIUM 5 MG PO TABS
5.0000 mg | ORAL_TABLET | Freq: Once | ORAL | Status: AC
  Administered 2014-03-23: 5 mg via ORAL
  Filled 2014-03-23: qty 1

## 2014-03-23 MED ORDER — PROPOFOL 10 MG/ML IV BOLUS
INTRAVENOUS | Status: DC | PRN
  Administered 2014-03-23: 80 mg via INTRAVENOUS

## 2014-03-23 MED ORDER — LIDOCAINE HCL (CARDIAC) 20 MG/ML IV SOLN
INTRAVENOUS | Status: DC | PRN
  Administered 2014-03-23: 40 mg via INTRAVENOUS

## 2014-03-23 MED ORDER — DILTIAZEM HCL 30 MG PO TABS
30.0000 mg | ORAL_TABLET | Freq: Four times a day (QID) | ORAL | Status: DC
  Administered 2014-03-23 – 2014-03-24 (×5): 30 mg via ORAL
  Filled 2014-03-23 (×9): qty 1

## 2014-03-23 NOTE — Procedures (Signed)
Procedure: Electrical Cardioversion Indications:  Atrial Fibrillation  Procedure Details:  Consent: Risks of procedure as well as the alternatives and risks of each were explained to the (patient/caregiver).  Consent for procedure obtained.  Time Out: Verified patient identification, verified procedure, site/side was marked, verified correct patient position, special equipment/implants available, medications/allergies/relevent history reviewed, required imaging and test results available.  Performed  Patient placed on cardiac monitor, pulse oximetry, supplemental oxygen as necessary.  Sedation given: Propofol 80 mg IV, Dr. Ermalene Postin Pacer pads placed anterior and posterior chest.  Cardioverted 2 time(s).  Cardioverted synchronized biphasic at 120J unsuccessfully, second attempt at 200 J successful  Evaluation: Findings: Post procedure EKG shows: NSR Complications: None Patient did tolerate procedure well.  Time Spent Directly with the Patient:  30 minutes   Rocky Rishel 03/23/2014, 12:08 PM

## 2014-03-23 NOTE — Transfer of Care (Signed)
Immediate Anesthesia Transfer of Care Note  Patient: [name]  Procedure(s) Performed: Procedure(s): CARDIOVERSION (N/A)  Patient Location: Nursing Unit  Anesthesia Type:MAC  Level of Consciousness: awake, alert  and oriented  Airway & Oxygen Therapy: Patient Spontanous Breathing and Patient connected to nasal cannula oxygen  Post-op Assessment: Report given to PACU RN  Post vital signs: Reviewed and stable  Complications: No apparent anesthesia complications

## 2014-03-23 NOTE — Progress Notes (Signed)
Time out performed with anesthesia and Dr. Sallyanne Kuster. Will monitor patient throughout procedure.

## 2014-03-23 NOTE — Progress Notes (Signed)
ANTICOAGULATION CONSULT NOTE - Follow Up Consult  Pharmacy Consult for Heparin and Coumadin Indication: atrial fibrillation  No Known Allergies  Patient Measurements: Height: 6' (182.9 cm) Weight: 186 lb 1.1 oz (84.4 kg) (B SCALE) IBW/kg (Calculated) : 77.6 Heparin Dosing Weight: 84.4kg  Vital Signs: Temp: 98 F (36.7 C) (09/07 0156) Temp src: Oral (09/07 0156) BP: 135/88 mmHg (09/07 0156) Pulse Rate: 82 (09/07 0156)  Labs:  Recent Labs  03/20/14 1111 03/20/14 1922 03/21/14 0046 03/21/14 0645 03/22/14 0344 03/22/14 1818 03/23/14 0230  HGB 12.7*  --   --   --   --   --  12.4*  HCT 38.2*  --   --   --   --   --  38.1*  PLT 148*  --   --   --   --   --  153  LABPROT 24.7*  --  24.2*  --  21.7*  --  22.1*  INR 2.23*  --  2.17*  --  1.89*  --  1.93*  HEPARINUNFRC  --   --   --   --   --  <0.10* 0.13*  CREATININE 1.27  --  1.50*  --   --   --   --   TROPONINI <0.30 <0.30 <0.30 <0.30  --   --   --     Estimated Creatinine Clearance: 40.2 ml/min (by C-G formula based on Cr of 1.5).  Assessment: 80 yo male with Afib for anticoagulation   Goal of Therapy:  INR 2-3 Heparin level 0.3-0.7 units/ml Monitor platelets by anticoagulation protocol: Yes   Plan:  Increase Heparin 1300 units/hr Check heparin level in 8 hours.  Coumadin 5 mg today  Caryl Pina 03/23/2014,3:52 AM

## 2014-03-23 NOTE — Progress Notes (Signed)
Patient sedated at this time, however patient able to answer questions. Patient oriented x4. Wife at bedside. Vitals stable. Patient appears to be in SB/NSR, will confirm with EKG. Patient has no complaints at this time. Will continue to monitor.

## 2014-03-23 NOTE — Anesthesia Postprocedure Evaluation (Signed)
  Anesthesia Post-op Note  Patient: [name]  Procedure(s) Performed: Procedure(s): CARDIOVERSION (N/A)  Patient Location: Nursing Unit  Anesthesia Type:MAC  Level of Consciousness: awake, alert  and oriented  Airway and Oxygen Therapy: Patient Spontanous Breathing and Patient connected to nasal cannula oxygen  Post-op Pain: none  Post-op Assessment: Post-op Vital signs reviewed, Patient's Cardiovascular Status Stable, Respiratory Function Stable, Patent Airway, No signs of Nausea or vomiting, Adequate PO intake and Pain level controlled  Post-op Vital Signs: Reviewed and stable  Last Vitals:  Filed Vitals:   03/23/14 0900  BP: 123/85  Pulse: 88  Temp: 36.4 C  Resp: 18    Complications: No apparent anesthesia complications

## 2014-03-23 NOTE — Anesthesia Preprocedure Evaluation (Signed)
Anesthesia Evaluation  Patient identified by MRN, date of birth, ID band Patient awake    Reviewed: Allergy & Precautions, H&P , NPO status , Patient's Chart, lab work & pertinent test results, reviewed documented beta blocker date and time   Airway       Dental   Pulmonary sleep apnea ,          Cardiovascular hypertension, + CAD and + Peripheral Vascular Disease     Neuro/Psych PSYCHIATRIC DISORDERS Depression    GI/Hepatic GERD-  ,  Endo/Other  Hypothyroidism   Renal/GU      Musculoskeletal   Abdominal   Peds  Hematology   Anesthesia Other Findings   Reproductive/Obstetrics                           Anesthesia Physical Anesthesia Plan  ASA: III  Anesthesia Plan: MAC   Post-op Pain Management:    Induction: Intravenous  Airway Management Planned: Mask  Additional Equipment:   Intra-op Plan:   Post-operative Plan:   Informed Consent: I have reviewed the patients History and Physical, chart, labs and discussed the procedure including the risks, benefits and alternatives for the proposed anesthesia with the patient or authorized representative who has indicated his/her understanding and acceptance.   Dental advisory given  Plan Discussed with: CRNA, Anesthesiologist and Surgeon  Anesthesia Plan Comments:         Anesthesia Quick Evaluation

## 2014-03-23 NOTE — Care Management Note (Addendum)
  Page 1 of 1   03/24/2014     5:12:24 PM CARE MANAGEMENT NOTE 03/24/2014  Patient:  DELOS, KLICH   Account Number:  [id number]  Date Initiated:  03/23/2014  Documentation initiated by:  Elissa Hefty  Subjective/Objective Assessment:   adm w ch pain     Action/Plan:   CM to follow for disposition needs   Anticipated DC Date:  03/25/2014   Anticipated DC Plan:  HOME/SELF CARE         Choice offered to / List presented to:             Status of service:  Completed, signed off Medicare Important Message given?  YES (If response is "NO", the following Medicare IM given date fields will be blank) Date Medicare IM given:  03/23/2014 Medicare IM given by:  Elissa Hefty Date Additional Medicare IM given:  03/24/2014 Additional Medicare IM given by:  [place]  Discharge Disposition:  HOME/SELF CARE  Per UR Regulation:  Reviewed for med. necessity/level of care/duration of stay  If discussed at [place] of Stay Meetings, dates discussed:    Comments:  Syncere Kaminski RN, BSN, MSHL, CCM  Nurse - Case Manager,  ([phone]  03/24/2014 Cardioversion 03/23/14 Social:  From home with family Dispo Plan:  Home self care.

## 2014-03-23 NOTE — Progress Notes (Signed)
    SUBJECTIVE:  He denies any pain.  He has chronic SOB.     PHYSICAL EXAM Filed Vitals:   03/22/14 2055 03/22/14 2344 03/23/14 0156 03/23/14 0559  BP: 131/87  135/88 138/83  Pulse: 97 88 82 100  Temp: 98.2 F (36.8 C)  98 F (36.7 C) 97.7 F (36.5 C)  TempSrc: Oral  Oral Oral  Resp: 18 19 18 18   Height:      Weight:    184 lb 15.1 oz (83.89 kg)  SpO2: 95% 97% 100% 95%   General:  No distress Lungs:  Clear Heart:  Irregular Abdomen: Positive bowel sounds, no rebound no guarding Extremities:  No edema   LABS:  Results for orders placed during the hospital encounter of 03/20/14 (from the past 24 hour(s))  HEPARIN LEVEL (UNFRACTIONATED)     Status: Abnormal   Collection Time    03/22/14  6:18 PM      Result Value Ref Range   Heparin Unfractionated <0.10 (*) 0.30 - 0.70 IU/mL  HEPARIN LEVEL (UNFRACTIONATED)     Status: Abnormal   Collection Time    03/23/14  2:30 AM      Result Value Ref Range   Heparin Unfractionated 0.13 (*) 0.30 - 0.70 IU/mL  CBC     Status: Abnormal   Collection Time    03/23/14  2:30 AM      Result Value Ref Range   WBC 6.1  4.0 - 10.5 K/uL   RBC 4.21 (*) 4.22 - 5.81 MIL/uL   Hemoglobin 12.4 (*) 13.0 - 17.0 g/dL   HCT 38.1 (*) 39.0 - 52.0 %   MCV 90.5  78.0 - 100.0 fL   MCH 29.5  26.0 - 34.0 pg   MCHC 32.5  30.0 - 36.0 g/dL   RDW 22.5 (*) 11.5 - 15.5 %   Platelets 153  150 - 400 K/uL  PROTIME-INR     Status: Abnormal   Collection Time    03/23/14  2:30 AM      Result Value Ref Range   Prothrombin Time 22.1 (*) 11.6 - 15.2 seconds   INR 1.93 (*) 0.00 - 1.49    Intake/Output Summary (Last 24 hours) at 03/23/14 0831 Last data filed at 03/23/14 0400  Gross per 24 hour  Intake    353 ml  Output    150 ml  Net    203 ml    ASSESSMENT AND PLAN:  CHEST PAIN:  Ruled out and Lexiscan Myoview negative.   NO further work up.    ATRIAL FIB: His INR is subtherapeutic.  However, he did not have greater than 48 hours of an INR below two and  he has been on heparin.  OK to cardiovert.  However, I anesthesiology has not been arranged as far as I can tell.  We will need to do this in the AM and cardiovert tomorrow.    HYPOTHYROIDISM:  Continue current therapy.  TSH is very mildly elevated.  Defer to Nyoka Cowden, MD  Minus Breeding 03/23/2014 8:31 AM

## 2014-03-23 NOTE — Progress Notes (Signed)
Pt was scheduled for cardioversion as an "add on" today.  Unfortunately, he had a small amount of sprite at 9am.  I have spoken with CRNA who states ok to proceed with cardioversion at 1pm today.  Orders placed.  Patient is NPO.  He has been appropriately anticoagulated with coumadin and bridged with heparin.  OK to proceed.

## 2014-03-23 NOTE — Progress Notes (Signed)
PHARMACY NOTE  Pharmacy Consult :  80 y.o. male is currently on Coumadin with Heparin bridging for atrial fibrillation .   Dosing Wt :  84.4 kg  Hematology :  Recent Labs  03/21/14 0046 03/22/14 0344 03/22/14 1818 03/23/14 0230 03/23/14 1625  HGB  --   --   --  12.4*  --   HCT  --   --   --  38.1*  --   PLT  --   --   --  153  --   LABPROT 24.2* 21.7*  --  22.1*  --   INR 2.17* 1.89*  --  1.93*  --   HEPARINUNFRC  --   --  <0.10* 0.13* 0.57  CREATININE 1.50*  --   --   --   --     Current Medication[s] Include:  Scheduled:  Warfarin 5 mg po x 1 today, 03/23/2014   Infusion[s]: Infusions:  . heparin 1,300 Units/hr (03/23/14 0935)   Assessment :  Heparin level 0.57 units/ml.  No evidence of bleeding complications observed.  Goal :  Heparin level 0.3-0.7 units/ml.  Plan : 1. Heparin will be continued at 1300 units/hr.   The next Heparin Level with AM labs.  2. Coumadin as previously ordered. 3. Daily Heparin level, INR, CBC, and Monitor for bleeding complications.  Follow Platlet counts.  Al Bracewell, Craig Guess,  Pharm.D  03/23/2014  6:05 PM

## 2014-03-23 NOTE — Progress Notes (Signed)
Room set up for bedside cardioversion. Code cart at bedside. Anesthesia and Dr. Sallyanne Kuster at bedside as well.

## 2014-03-23 NOTE — Progress Notes (Signed)
Patient sleeping peacefully. Maintained on telemetry and is in A-fib on the monitor with heart rate of 84. Patient maintained NPO for cardio-version today. CPAP is on and patient is resting comfortably. Will continue to monitor.  Esperanza Heir, RN

## 2014-03-23 NOTE — Progress Notes (Signed)
Pt has refused cpap for tonight due to pain.  Rt will continue to monitor.

## 2014-03-24 ENCOUNTER — Encounter (HOSPITAL_COMMUNITY): Payer: Self-pay | Admitting: Internal Medicine

## 2014-03-24 LAB — CBC
HEMATOCRIT: 36.2 % — AB (ref 39.0–52.0)
Hemoglobin: 12 g/dL — ABNORMAL LOW (ref 13.0–17.0)
MCH: 29.9 pg (ref 26.0–34.0)
MCHC: 33.1 g/dL (ref 30.0–36.0)
MCV: 90 fL (ref 78.0–100.0)
Platelets: 145 10*3/uL — ABNORMAL LOW (ref 150–400)
RBC: 4.02 MIL/uL — AB (ref 4.22–5.81)
RDW: 23 % — ABNORMAL HIGH (ref 11.5–15.5)
WBC: 6.3 10*3/uL (ref 4.0–10.5)

## 2014-03-24 LAB — HEPARIN LEVEL (UNFRACTIONATED): Heparin Unfractionated: 0.71 IU/mL — ABNORMAL HIGH (ref 0.30–0.70)

## 2014-03-24 LAB — PROTIME-INR
INR: 2.22 — ABNORMAL HIGH (ref 0.00–1.49)
Prothrombin Time: 24.6 seconds — ABNORMAL HIGH (ref 11.6–15.2)

## 2014-03-24 MED ORDER — ASPIRIN 81 MG PO TBEC: 81.0000 mg | DELAYED_RELEASE_TABLET | Freq: Every day | ORAL | Status: DC

## 2014-03-24 MED ORDER — DILTIAZEM HCL ER COATED BEADS 120 MG PO CP24: 120.0000 mg | ORAL_CAPSULE | Freq: Every day | ORAL | Status: DC

## 2014-03-24 MED ORDER — DILTIAZEM HCL ER COATED BEADS 120 MG PO CP24
120.0000 mg | ORAL_CAPSULE | Freq: Every day | ORAL | Status: DC
  Filled 2014-03-24: qty 1

## 2014-03-24 MED ORDER — WARFARIN SODIUM 2.5 MG PO TABS
2.5000 mg | ORAL_TABLET | Freq: Once | ORAL | Status: DC
  Filled 2014-03-24: qty 1

## 2014-03-24 NOTE — Discharge Instructions (Signed)

## 2014-03-24 NOTE — Progress Notes (Signed)
Patient discharged home with wife. Instructions given for medication administration, Follow up appointments given

## 2014-03-24 NOTE — Progress Notes (Signed)
Patient Name: [name] Date of Encounter: 03/24/2014     Active Problems:   Atrial fibrillation   Chest pain    SUBJECTIVE  Started having RUQ pain this morning, no CP or SOB.   CURRENT MEDS . amiodarone  200 mg Oral Daily  . aspirin EC  81 mg Oral Daily  . clonazePAM  1 mg Oral QHS  . diltiazem  30 mg Oral 4 times per day  . ferrous sulfate  325 mg Oral BID WC  . levothyroxine  25 mcg Oral QAC breakfast  . pantoprazole  40 mg Oral BID WC  . simvastatin  10 mg Oral q1800  . sodium chloride  3 mL Intravenous Q12H  . Warfarin - Pharmacist Dosing Inpatient   Does not apply q1800    OBJECTIVE  Filed Vitals:   03/23/14 1227 03/23/14 1500 03/23/14 2029 03/24/14 0425  BP: 120/81 116/66 117/67 103/55  Pulse: 53 60 56 54  Temp:  97.4 F (36.3 C) 97.7 F (36.5 C) 97.4 F (36.3 C)  TempSrc:  Oral Oral Oral  Resp:  18 20 20   Height:      Weight:    189 lb (85.73 kg)  SpO2: 99% 99% 99% 94%    Intake/Output Summary (Last 24 hours) at 03/24/14 0938 Last data filed at 03/24/14 7939  Gross per 24 hour  Intake  818.9 ml  Output    600 ml  Net  218.9 ml   Filed Weights   03/22/14 0626 03/23/14 0559 03/24/14 0425  Weight: 186 lb 1.1 oz (84.4 kg) 184 lb 15.1 oz (83.89 kg) 189 lb (85.73 kg)    PHYSICAL EXAM  General: Pleasant, NAD. Neuro: Alert and oriented X 3. Moves all extremities spontaneously. Psych: Normal affect. HEENT:  Normal  Neck: Supple without bruits or JVD. Lungs:  Resp regular and unlabored, CTA. Heart: RRR no s3, s4, or murmurs. Abdomen: Soft, non-tender, non-distended, BS + x 4.  Extremities: No clubbing, cyanosis or edema. DP/PT/Radials 2+ and equal bilaterally.  Accessory Clinical Findings  CBC  Recent Labs  03/23/14 0230 03/24/14 0458  WBC 6.1 6.3  HGB 12.4* 12.0*  HCT 38.1* 36.2*  MCV 90.5 90.0  PLT 153 145*   Thyroid Function Tests  Recent Labs  03/22/14 0344  TSH 5.270*    TELE Sinus bradycardia with HR 40-50s, no  significant ventricular ectopy    ECG  Sinus brady with 50s     Radiology/Studies  Dg Chest 2 View  03/20/2014   CLINICAL DATA:  Weakness, shortness of breath, history hypertension, coronary artery disease  EXAM: CHEST  2 VIEW  COMPARISON:  06/09/2013  FINDINGS: Enlargement of cardiac silhouette.  Tortuous aorta.  Pulmonary vascularity mediastinal contours otherwise normal.  Mild chronic peribronchial thickening and LEFT basilar scarring.  No acute infiltrate, pleural effusion or pneumothorax.  Scattered mild endplate spur formation thoracic spine.  Faint probable RIGHT nipple shadow, also seen on 11/19/2009.  IMPRESSION: Enlargement of cardiac silhouette.  Minimal chronic bronchitic changes and LEFT basilar scarring.  No acute abnormalities.   Electronically Signed   By: Lavonia Dana M.D.   On: 03/20/2014 11:48   Nm Myocar Multi W/spect W/wall Motion / Ef  03/22/2014   CLINICAL DATA:  Chest pain  EXAM: MYOCARDIAL IMAGING WITH SPECT (REST AND PHARMACOLOGIC-STRESS)  GATED LEFT VENTRICULAR WALL MOTION STUDY  LEFT VENTRICULAR EJECTION FRACTION  TECHNIQUE: Standard myocardial SPECT imaging was performed after resting intravenous injection of 10 mCi Tc-70m sestamibi. Subsequently, intravenous  infusion of Lexiscan was performed under the supervision of the Cardiology staff. At peak effect of the drug, 30 mCi Tc-11m sestamibi was injected intravenously and standard myocardial SPECT imaging was performed. Quantitative gated imaging was also performed to evaluate left ventricular wall motion, and estimate left ventricular ejection fraction.  COMPARISON:  None.  FINDINGS: Perfusion: Allowing for some patchy attenuation artifact in the myocardium, there are no convincing perfusion defects and no evidence of stress-induced ischemia.  Wall Motion: Normal left ventricular wall motion. No left ventricular dilation.  Left Ventricular Ejection Fraction: 46 %  End diastolic volume 111 ml  End systolic volume 56 ml   IMPRESSION: 1. No reversible ischemia or infarction.  2. Normal left ventricular wall motion.  3. Left ventricular ejection fraction 46%  4. Low-risk stress test findings*.  *2012 Appropriate Use Criteria for Coronary Revascularization Focused Update: J Am Coll Cardiol. 5520;80(2):233-612. http://content.airportbarriers.com.aspx?articleid=1201161   Electronically Signed   By: Maryclare Bean M.D.   On: 03/22/2014 12:35    ASSESSMENT AND PLAN  1. PAF   - INR 2.22, s/p DC cardioversion yesterday by Dr. Sallyanne Kuster  - will convert diltiazem to 120mg  long acting on discharge  - possibly discharge today if no limited RUQ u/s for RUQ pain    2. CP  - negative serial troponin and lexiscan myoview negative EF 46%  3. CAD s/p PCI and Stent x 4   4. Hypothyroidism  - TSH mildly elevated  5. RUQ pain started this morning  - afebrile, WBC normal  - gallbladder vs tele monitor rubbing on the area, started during breakfast. Will check with Dr. Meda Coffee  Signed, Almyra Deforest PA-C Pager: [phone]   The patient was seen, examined and discussed with Almyra Deforest, PA-C and I agree with the above.   80 year old male admitted with a-fib with RVR, s/p DCCV yesterday, maintaining SR on Diltiazem. INR 2.2, he will be discharged today and followed up in our clinic. BP controlled.  [name] 03/24/2014

## 2014-03-24 NOTE — Progress Notes (Signed)
ANTICOAGULATION CONSULT NOTE - Follow Up Consult  Pharmacy Consult for Heparin and Coumadin Indication: atrial fibrillation  No Known Allergies  Patient Measurements: Height: 6' (182.9 cm) Weight: 189 lb (85.73 kg) IBW/kg (Calculated) : 77.6 Heparin Dosing Weight: 84 kg  Vital Signs: Temp: 97.4 F (36.3 C) (09/08 0425) Temp src: Oral (09/08 0425) BP: 103/55 mmHg (09/08 0425) Pulse Rate: 54 (09/08 0425)  Labs:  Recent Labs  03/22/14 0344  03/23/14 0230 03/23/14 1625 03/24/14 0458  HGB  --   --  12.4*  --  12.0*  HCT  --   --  38.1*  --  36.2*  PLT  --   --  153  --  145*  LABPROT 21.7*  --  22.1*  --  24.6*  INR 1.89*  --  1.93*  --  2.22*  HEPARINUNFRC  --   < > 0.13* 0.57 0.71*  < > = values in this interval not displayed.  Estimated Creatinine Clearance: 40.2 ml/min (by C-G formula based on Cr of 1.5).  Assessment: 80 y/o male who presented to the ED on 9/4 with increasing general weakness over the last couple days. He has a hx Afib on chronic warfarin and is s/p DCCV yesterday. INR is therapeutic at 2.22. Heparin level was slightly above goal this morning at 0.71. No bleeding noted, CBC is stable.  Goal of Therapy:  INR 2-3 Heparin level 0.3-0.7 units/ml Monitor platelets by anticoagulation protocol: Yes   Plan:  Decrease heparin to 1250 units/hr - can this be d/c'd since INR >2? Warfarin 2.5 mg PO tonight Heparin level, CBC, INR daily  [facility], Pharm.D., BCPS Clinical Pharmacist Pager: [phone] 03/24/2014 10:04 AM

## 2014-03-24 NOTE — Discharge Summary (Signed)
Discharge Summary   Patient ID: [name],  MRN: [id number], DOB/AGE: 19-May-1927 80 y.o.  Admit date: 03/20/2014 Discharge date: 03/24/2014  Primary Care Provider: Nyoka Cowden Primary Cardiologist: Dr. Percival Spanish  Discharge Diagnoses Principal Problem:   Atrial fibrillation Active Problems:   HYPOTHYROIDISM   HYPERLIPIDEMIA   MITRAL REGURGITATION   HYPERTENSION   CORONARY ARTERY DISEASE   GERD   Chest pain   Allergies No Known Allergies  Procedures  Cardioversion Procedure: Electrical Cardioversion  Indications: Atrial Fibrillation  Procedure Details:  Consent: Risks of procedure as well as the alternatives and risks of each were explained to the (patient/caregiver). Consent for procedure obtained.  Time Out: Verified patient identification, verified procedure, site/side was marked, verified correct patient position, special equipment/implants available, medications/allergies/relevent history reviewed, required imaging and test results available. Performed  Patient placed on cardiac monitor, pulse oximetry, supplemental oxygen as necessary.  Sedation given: Propofol 80 mg IV, Dr. Ermalene Postin  Pacer pads placed anterior and posterior chest.  Cardioverted 2 time(s).  Cardioverted synchronized biphasic at 120J unsuccessfully, second attempt at 200 J successful  Evaluation:  Findings: Post procedure EKG shows: NSR  Complications: None  Patient did tolerate procedure well.     Hospital Course  The patient is a pleasant 80 year old Caucasian male with past medical history of hypertension, hyperlipidemia, GERD, hypothyroidism, history of coronary artery disease status post multiple stents, and history of paroxysmal atrial fibrillation on chronic amiodarone and Coumadin therapy. He presented to [facility] ED on 03/20/2014 with recurrent atrial fibrillation with RVR. Of note, patient was previously seen the day before admission in the office and was started on 12.5 mg twice  a day of metoprolol. On arrival, he also complained of fatigue and intermittent left arm pain for several weeks.   After admission, given his fatigue and left arm pain concerning for angina, his Coumadin was stopped for possible left heart catheterization while bridging with heparin. Serial troponins overnight were negative. The patient was seen the following day, per Dr. Rayann Heman, he is reluctant for the patient to go through with cardiac catheterization at this time given his comorbidities. After discussing with the patient, he was planned for Lexiscan stress test. Patient underwent Lexiscan stress test in the morning of 03/22/2014 which showed EF 46%, no reversible ischemia or infarction, overall low risk finding. Given his normal stress test, he was restarted on Coumadin with heparin bridge. Patient was seen the morning of 03/23/2014, at which time it was noted he did have a subtherapeutic INR, however the duration was less than 48 hours, and he would be safe to cardiovert without TEE. He underwent cardioversion without TEE on 03/23/2014 and was successfully cardioverted after he shocked twice.  He was seen in the morning of 9/8, telemetry did reveal he has been bradycardic with the lowest dose of diltiazem with heart rate in the 40s to 50s. However the patient is asymptomatic and denies any chest discomfort dizziness, or presyncopal episodes. He does have some right upper quadrant abdominal pain, however no fever or elevated white blood cell count. Dr. Meda Coffee seen the patient, and the right upper quadrant pain it is felt to be mainly musculoskeletal. She is deemed stable for discharge from cardiology perspective. Patient to followup with his PCP regarding elevated TSH result.    Discharge Vitals Blood pressure 116/69, pulse 57, temperature 97.6 F (36.4 C), temperature source Oral, resp. rate 20, height 6' (1.829 m), weight 189 lb (85.73 kg), SpO2 98.00%.  Filed Weights   03/22/14 [phone]  03/23/14 0559  03/24/14 0425  Weight: 186 lb 1.1 oz (84.4 kg) 184 lb 15.1 oz (83.89 kg) 189 lb (85.73 kg)    Labs  CBC  Recent Labs  03/23/14 0230 03/24/14 0458  WBC 6.1 6.3  HGB 12.4* 12.0*  HCT 38.1* 36.2*  MCV 90.5 90.0  PLT 153 145*   Thyroid Function Tests  Recent Labs  03/22/14 0344  TSH 5.270*    Disposition  Pt is being discharged home today in good condition.  Follow-up Plans & Appointments      Follow-up Information   Follow up with [facility] On 04/01/2014. (check PT/INR. 2:45pm. New appt, ignore previous appt)    Contact information:   [address] [place] [address]       Follow up with Nyoka Cowden, MD. (Follow up with your PCP regarding elevated TSH 5.27)    Specialty:  Internal Medicine   Contact information:   [place] [address] [phone]       Follow up with Minus Breeding, MD On 03/31/2014. (8:45am. Previous appt cancelled, please use this new appt which has already been made)    Specialty:  Cardiology   Contact information:   [address] [place] [address] [phone]       Discharge Medications    Medication List    STOP taking these medications       metoprolol tartrate 25 MG tablet  Commonly known as:  LOPRESSOR      TAKE these medications       amiodarone 200 MG tablet  Commonly known as:  PACERONE  Take 200 mg by mouth daily.     aspirin 81 MG EC tablet  Take 1 tablet (81 mg total) by mouth daily.     clonazePAM 1 MG tablet  Commonly known as:  KLONOPIN  Take 1 mg by mouth at bedtime.     DIGESTIVE ENZYMES PO  Take 1 tablet by mouth every evening.     diltiazem 120 MG 24 hr capsule  Commonly known as:  CARDIZEM CD  Take 1 capsule (120 mg total) by mouth daily.     ferrous sulfate 325 (65 FE) MG tablet  Take 325 mg by mouth 2 (two) times daily with a meal.     fish oil-omega-3 fatty acids 1000 MG capsule  Take 1 g by mouth 2  (two) times daily.     hydroxypropyl methylcellulose / hypromellose 2.5 % ophthalmic solution  Commonly known as:  ISOPTO TEARS / GONIOVISC  Place 1 drop into both eyes 3 (three) times daily as needed for dry eyes.     levothyroxine 25 MCG tablet  Commonly known as:  SYNTHROID, LEVOTHROID  Take 25 mcg by mouth daily.     multivitamin capsule  Take 1 capsule by mouth daily.     nitroGLYCERIN 0.4 MG SL tablet  Commonly known as:  NITROSTAT  Place 0.4 mg under the tongue every 5 (five) minutes as needed for chest pain.     pantoprazole 40 MG tablet  Commonly known as:  PROTONIX  Take 40 mg by mouth 2 (two) times daily.     pravastatin 40 MG tablet  Commonly known as:  PRAVACHOL  Take 40 mg by mouth every evening.     traZODone 50 MG tablet  Commonly known as:  DESYREL  Take 50-100 mg by mouth at bedtime as needed for sleep.     warfarin 5  MG tablet  Commonly known as:  COUMADIN  Take 2.5-5 mg by mouth daily. 5 mg tue, thur, sat and 2.5 mg all other days        Outstanding Labs/Studies  Follow up lab PT/INR as previously scheduled on 9/23  Duration of Discharge Encounter   Greater than 30 minutes including physician time.  Hilbert Corrigan PA-C Pager: [phone] 03/24/2014, 11:57 AM

## 2014-03-24 NOTE — Discharge Summary (Signed)
[name] 03/24/2014

## 2014-03-25 ENCOUNTER — Ambulatory Visit: Payer: Medicare Other | Admitting: Cardiology

## 2014-03-31 ENCOUNTER — Encounter: Payer: Self-pay | Admitting: Cardiology

## 2014-03-31 ENCOUNTER — Ambulatory Visit (INDEPENDENT_AMBULATORY_CARE_PROVIDER_SITE_OTHER): Payer: Medicare Other | Admitting: Cardiology

## 2014-03-31 ENCOUNTER — Ambulatory Visit (INDEPENDENT_AMBULATORY_CARE_PROVIDER_SITE_OTHER): Payer: Medicare Other | Admitting: Pharmacist

## 2014-03-31 VITALS — BP 132/64 | HR 55 | Ht 72.0 in | Wt 195.5 lb

## 2014-03-31 DIAGNOSIS — I2510 Atherosclerotic heart disease of native coronary artery without angina pectoris: Secondary | ICD-10-CM

## 2014-03-31 DIAGNOSIS — I480 Paroxysmal atrial fibrillation: Secondary | ICD-10-CM

## 2014-03-31 DIAGNOSIS — R0609 Other forms of dyspnea: Secondary | ICD-10-CM

## 2014-03-31 DIAGNOSIS — Z7901 Long term (current) use of anticoagulants: Secondary | ICD-10-CM | POA: Diagnosis not present

## 2014-03-31 DIAGNOSIS — I4891 Unspecified atrial fibrillation: Secondary | ICD-10-CM | POA: Diagnosis not present

## 2014-03-31 DIAGNOSIS — Z5181 Encounter for therapeutic drug level monitoring: Secondary | ICD-10-CM

## 2014-03-31 DIAGNOSIS — R0600 Dyspnea, unspecified: Secondary | ICD-10-CM

## 2014-03-31 DIAGNOSIS — R0989 Other specified symptoms and signs involving the circulatory and respiratory systems: Secondary | ICD-10-CM | POA: Diagnosis not present

## 2014-03-31 LAB — POCT INR: INR: 2.3

## 2014-03-31 NOTE — Progress Notes (Signed)
HPI The patient presents for followup after recent hospitalization for atrial fibrillation. He required repeat cardioversion. He presented with some chest discomfort ruled out for myocardial infarction and no further testing was indicated. He had some increased dyspnea managed with rhythm control. However, his biggest complaint continues to be fatigue.  Since going home a few days ago he's had continued fatigue. However, he's not had any chest pressure, neck or arm discomfort. He's not had any palpitations. He tried to do a little walking yesterday.  No Known Allergies  Current Outpatient Prescriptions  Medication Sig Dispense Refill  . amiodarone (PACERONE) 200 MG tablet Take 200 mg by mouth daily.      [name] aspirin EC 81 MG EC tablet Take 1 tablet (81 mg total) by mouth daily.      . clonazePAM (KLONOPIN) 1 MG tablet Take 1 mg by mouth at bedtime.        [name] DIGESTIVE ENZYMES PO Take 1 tablet by mouth every evening.       . diltiazem (CARDIZEM CD) 120 MG 24 hr capsule Take 1 capsule (120 mg total) by mouth daily.  30 capsule  11  . ferrous sulfate 325 (65 FE) MG tablet Take 325 mg by mouth 2 (two) times daily with a meal.      . fish oil-omega-3 fatty acids 1000 MG capsule Take 1 g by mouth 2 (two) times daily.       . hydroxypropyl methylcellulose (ISOPTO TEARS) 2.5 % ophthalmic solution Place 1 drop into both eyes 3 (three) times daily as needed for dry eyes.       [name] levothyroxine (SYNTHROID, LEVOTHROID) 25 MCG tablet Take 25 mcg by mouth daily.       . Multiple Vitamin (MULTIVITAMIN) capsule Take 1 capsule by mouth daily.      . nitroGLYCERIN (NITROSTAT) 0.4 MG SL tablet Place 0.4 mg under the tongue every 5 (five) minutes as needed for chest pain.       . pantoprazole (PROTONIX) 40 MG tablet Take 40 mg by mouth 2 (two) times daily.       . pravastatin (PRAVACHOL) 40 MG tablet Take 40 mg by mouth every evening.      . traZODone (DESYREL) 50 MG tablet Take 50-100 mg by mouth at bedtime as needed  for sleep.      [name] warfarin (COUMADIN) 5 MG tablet Take 2.5-5 mg by mouth daily. 5 mg tue, thur, sat and 2.5 mg all other days       No current facility-administered medications for this visit.    Past Medical History  Diagnosis Date  . HYPOTHYROIDISM 05/20/2007  . HYPERLIPIDEMIA 10/20/2008  . DEPRESSION 12/17/2007  . MITRAL REGURGITATION 10/20/2008  . HYPERTENSION 04/10/2007  . CORONARY ARTERY DISEASE     a. s/p stent OM 2002. b. s/p BMS 2006 RCA  c. s/p cath 2007.  [name] GERD 04/10/2007  . DIVERTICULOSIS, COLON 04/10/2007  . BENIGN PROSTATIC HYPERTROPHY 04/10/2007  . GYNECOMASTIA, UNILATERAL 03/23/2010  . SLEEP APNEA 05/20/2007  . WEAKNESS 11/09/2009  . Hematoma of abdominal wall   . Clotting disorder     anticoag. therapy  . Mallory - Weiss tear     > 5 years ago  . Barrett's esophagus   . Popliteal artery embolism, right   . History of echocardiogram     a. EF 55% (TEE 08/2011)  . Paroxysmal a-fib     a. chronic amiodarone and coumadin;  b. 05/2013 s/p DCCV.    Past  Surgical History  Procedure Laterality Date  . Transurethral resection of prostate    . Coronary angioplasty with stent placement    . Tee with cardioversion  7/08  . Cardioversion  8/08  . Esophagogastroduodenoscopy    . Tee without cardioversion  09/14/2011    Procedure: TRANSESOPHAGEAL ECHOCARDIOGRAM (TEE);  Surgeon: Josue Hector, MD;  Location: [facility];  Service: Cardiovascular;  Laterality: N/A;  . Cardioversion  09/14/2011    Procedure: CARDIOVERSION;  Surgeon: Josue Hector, MD;  Location: [facility];  Service: Cardiovascular;  Laterality: N/A;  . Cardioversion N/A 03/23/2014    Procedure: CARDIOVERSION;  Surgeon: Thompson Grayer, MD;  Location: [facility];  Service: Cardiovascular;  Laterality: N/A;    ROS:  As stated in the HPI and negative for all other systems.  PHYSICAL EXAM BP 132/64  Pulse 55  Ht 6' (1.829 m)  Wt 195 lb 8 oz (88.678 kg)  BMI 26.51 kg/m2 GENERAL: No acute distress NECK:  No jugular  venous distention, waveform within normal limits, carotid upstroke brisk and symmetric, no bruits, no thyromegaly LYMPHATICS:  No cervical, inguinal adenopathy LUNGS:  Clear to auscultation bilaterally BACK:  No CVA tenderness CHEST:  Unremarkable HEART:  PMI not displaced or sustained,S1 and S2 within normal limits, no S3, no S4, no clicks, no rubs, apical and axillary late systolic murmur ABD:  Flat, positive bowel sounds normal in frequency in pitch, no bruits, no rebound, no guarding, no midline pulsatile mass, no hepatomegaly, no splenomegaly EXT:  2 plus pulses throughout, no edema, no cyanosis no clubbing   EKG: Sinus rhythm, rate 65, incomplete right bundle branch block,  no acute ST-T wave changes.  PACs.  03/31/2014  ASSESSMENT AND PLAN  ATRIAL FIBRILLATION - He is maintaining NSR after this most recent cardioversion.  He will stop his ASA.  Continue amiodarone  CORONARY ARTERY DISEASE -  The patient has no new sypmtoms.  No further cardiovascular testing is indicated.  We will continue with aggressive risk reduction and meds as listed.  HYPERTENSION -  The blood pressure is at target. No change in medications is indicated. We will continue with therapeutic lifestyle changes (TLC).  MITRAL REGURGITATION -  He has had only mild mitral regurgitation.  However, with continued dyspnea I will repeat an echo as he has not had one in about 3 years.   FATIGUE - He has had this as a chronic problem. His again in the hospital demonstrated no severe anemia and a mildly abnormal TSH. No change in therapy is suggested and followup with Nyoka Cowden, MD

## 2014-03-31 NOTE — Patient Instructions (Signed)
Your physician recommends that you schedule a follow-up appointment in:  4 months with Dr. Percival Spanish  Stop taking your ASA  We have ordered an Echo

## 2014-04-01 ENCOUNTER — Encounter: Payer: Self-pay | Admitting: Internal Medicine

## 2014-04-01 ENCOUNTER — Ambulatory Visit (INDEPENDENT_AMBULATORY_CARE_PROVIDER_SITE_OTHER): Payer: Medicare Other | Admitting: Internal Medicine

## 2014-04-01 VITALS — BP 122/76 | HR 54 | Temp 98.0°F | Resp 20 | Ht 72.0 in | Wt 196.0 lb

## 2014-04-01 DIAGNOSIS — I480 Paroxysmal atrial fibrillation: Secondary | ICD-10-CM

## 2014-04-01 DIAGNOSIS — I2510 Atherosclerotic heart disease of native coronary artery without angina pectoris: Secondary | ICD-10-CM | POA: Diagnosis not present

## 2014-04-01 DIAGNOSIS — R5383 Other fatigue: Principal | ICD-10-CM

## 2014-04-01 DIAGNOSIS — I4891 Unspecified atrial fibrillation: Secondary | ICD-10-CM | POA: Diagnosis not present

## 2014-04-01 DIAGNOSIS — I10 Essential (primary) hypertension: Secondary | ICD-10-CM

## 2014-04-01 DIAGNOSIS — R5381 Other malaise: Secondary | ICD-10-CM

## 2014-04-01 DIAGNOSIS — E785 Hyperlipidemia, unspecified: Secondary | ICD-10-CM

## 2014-04-01 DIAGNOSIS — E039 Hypothyroidism, unspecified: Secondary | ICD-10-CM | POA: Diagnosis not present

## 2014-04-01 DIAGNOSIS — Z23 Encounter for immunization: Secondary | ICD-10-CM | POA: Diagnosis not present

## 2014-04-01 NOTE — Patient Instructions (Signed)
Limit your sodium (Salt) intake    It is important that you exercise regularly, at least 20 minutes 3 to 4 times per week.  If you develop chest pain or shortness of breath seek  medical attention. 

## 2014-04-01 NOTE — Progress Notes (Signed)
Pre visit review using our clinic review tool, if applicable. No additional management support is needed unless otherwise documented below in the visit note. 

## 2014-04-01 NOTE — Progress Notes (Signed)
Subjective:    Patient ID: [name], male    DOB: [birth year], 80 y.o.   MRN: [id number]  HPI An 80 year old patient who is seen today in followup.  He was discharged the hospital about one week ago and required cardioversion for atrial fibrillation with rapid ventricular response.  He remains quite weak.  The symptoms that predated this hospital admission.  This has been a chronic problem.  Evaluation included a stress test revealed no ischemia and only very mildly impaired ejection fraction.  There is little activity.  He states he becomes weak and spends much of his day sleeping.  He has been treated for depression in the past  Hospital records reviewed  Past Medical History  Diagnosis Date  . HYPOTHYROIDISM 05/20/2007  . HYPERLIPIDEMIA 10/20/2008  . DEPRESSION 12/17/2007  . MITRAL REGURGITATION 10/20/2008  . HYPERTENSION 04/10/2007  . CORONARY ARTERY DISEASE     a. s/p stent OM 2002. b. s/p BMS 2006 RCA  c. s/p cath 2007.  [name] GERD 04/10/2007  . DIVERTICULOSIS, COLON 04/10/2007  . BENIGN PROSTATIC HYPERTROPHY 04/10/2007  . GYNECOMASTIA, UNILATERAL 03/23/2010  . SLEEP APNEA 05/20/2007  . WEAKNESS 11/09/2009  . Hematoma of abdominal wall   . Clotting disorder     anticoag. therapy  . Mallory - Weiss tear     > 5 years ago  . Barrett's esophagus   . Popliteal artery embolism, right   . History of echocardiogram     a. EF 55% (TEE 08/2011)  . Paroxysmal a-fib     a. chronic amiodarone and coumadin;  b. 05/2013 s/p DCCV.    History   Social History  . Marital Status: Married    Spouse Name: N/A    Number of Children: 1  . Years of Education: N/A   Occupational History  . retired    Social History Main Topics  . Smoking status: Never Smoker   . Smokeless tobacco: Never Used  . Alcohol Use: No  . Drug Use: No  . Sexual Activity: Not on file   Other Topics Concern  . Not on file   Social History Narrative   He is retired from Landscape [employer].   Mother lived into her 46's.   Father died in his 79's.  No CAD.    Past Surgical History  Procedure Laterality Date  . Transurethral resection of prostate    . Coronary angioplasty with stent placement    . Tee with cardioversion  7/08  . Cardioversion  8/08  . Esophagogastroduodenoscopy    . Tee without cardioversion  09/14/2011    Procedure: TRANSESOPHAGEAL ECHOCARDIOGRAM (TEE);  Surgeon: Josue Hector, MD;  Location: [place];  Service: Cardiovascular;  Laterality: N/A;  . Cardioversion  09/14/2011    Procedure: CARDIOVERSION;  Surgeon: Josue Hector, MD;  Location: [facility] ENDOSCOPY;  Service: Cardiovascular;  Laterality: N/A;  . Cardioversion N/A 03/23/2014    Procedure: CARDIOVERSION;  Surgeon: Thompson Grayer, MD;  Location: [facility] OR;  Service: Cardiovascular;  Laterality: N/A;    Family History  Problem Relation Age of Onset  . Hypertension Other   . Heart disease Brother     CAD male 1st degree relative    No Known Allergies  Current Outpatient Prescriptions on File Prior to Visit  Medication Sig Dispense Refill  . amiodarone (PACERONE) 200 MG tablet Take 200 mg by mouth daily.      [name] aspirin EC 81 MG EC tablet Take 1 tablet (81 mg total) by  mouth daily.      . clonazePAM (KLONOPIN) 1 MG tablet Take 1 mg by mouth at bedtime.        [name] DIGESTIVE ENZYMES PO Take 1 tablet by mouth every evening.       . diltiazem (CARDIZEM CD) 120 MG 24 hr capsule Take 1 capsule (120 mg total) by mouth daily.  30 capsule  11  . ferrous sulfate 325 (65 FE) MG tablet Take 325 mg by mouth 2 (two) times daily with a meal.      . fish oil-omega-3 fatty acids 1000 MG capsule Take 1 g by mouth 2 (two) times daily.       . hydroxypropyl methylcellulose (ISOPTO TEARS) 2.5 % ophthalmic solution Place 1 drop into both eyes 3 (three) times daily as needed for dry eyes.       [name] levothyroxine (SYNTHROID, LEVOTHROID) 25 MCG tablet Take 25 mcg by mouth daily.       . Multiple Vitamin (MULTIVITAMIN) capsule Take 1 capsule by mouth daily.      .  nitroGLYCERIN (NITROSTAT) 0.4 MG SL tablet Place 0.4 mg under the tongue every 5 (five) minutes as needed for chest pain.       . pantoprazole (PROTONIX) 40 MG tablet Take 40 mg by mouth 2 (two) times daily.       . pravastatin (PRAVACHOL) 40 MG tablet Take 40 mg by mouth every evening.      . traZODone (DESYREL) 50 MG tablet Take 50-100 mg by mouth at bedtime as needed for sleep.      [name] warfarin (COUMADIN) 5 MG tablet Take 2.5-5 mg by mouth daily. 5 mg tue, thur, sat and 2.5 mg all other days       No current facility-administered medications on file prior to visit.    BP 122/76  Pulse 54  Temp(Src) 98 F (36.7 C) (Oral)  Resp 20  Ht 6' (1.829 m)  Wt 196 lb (88.905 kg)  BMI 26.58 kg/m2  SpO2 98%      Review of Systems  Constitutional: Positive for fatigue. Negative for fever, chills and appetite change.  HENT: Negative for congestion, dental problem, ear pain, hearing loss, sore throat, tinnitus, trouble swallowing and voice change.   Eyes: Negative for pain, discharge and visual disturbance.  Respiratory: Negative for cough, chest tightness, wheezing and stridor.   Cardiovascular: Negative for chest pain, palpitations and leg swelling.  Gastrointestinal: Negative for nausea, vomiting, abdominal pain, diarrhea, constipation, blood in stool and abdominal distention.  Genitourinary: Negative for urgency, hematuria, flank pain, discharge, difficulty urinating and genital sores.  Musculoskeletal: Negative for arthralgias, back pain, gait problem, joint swelling, myalgias and neck stiffness.  Skin: Negative for rash.  Neurological: Positive for weakness. Negative for dizziness, syncope, speech difficulty, numbness and headaches.  Hematological: Negative for adenopathy. Does not bruise/bleed easily.  Psychiatric/Behavioral: Positive for sleep disturbance. Negative for behavioral problems and dysphoric mood. The patient is not nervous/anxious.        Objective:   Physical Exam    Constitutional: He is oriented to person, place, and time. He appears well-developed.  Alert Walks with a cane Frail Blood pressure well controlled  HENT:  Head: Normocephalic.  Right Ear: External ear normal.  Left Ear: External ear normal.  Eyes: Conjunctivae and EOM are normal.  Neck: Normal range of motion.  Cardiovascular: Normal rate, regular rhythm and normal heart sounds.   Pulmonary/Chest: Breath sounds normal. No respiratory distress. He has no rales.  Abdominal: Bowel  sounds are normal.  Musculoskeletal: Normal range of motion. He exhibits no edema and no tenderness.  Neurological: He is alert and oriented to person, place, and time.  Psychiatric: He has a normal mood and affect. His behavior is normal.          Assessment & Plan:   Paroxysmal atrial fibrillation.  Remains normal sinus rhythm Coronary artery disease, stable Chronic fatigue Hypothyroidism Dyslipidemia.  Continue statin therapy  Daily exercise regimen discussed Recheck 3 months No change in therapy

## 2014-04-08 ENCOUNTER — Encounter: Payer: Self-pay | Admitting: Pulmonary Disease

## 2014-04-08 ENCOUNTER — Ambulatory Visit (HOSPITAL_COMMUNITY)
Admission: RE | Admit: 2014-04-08 | Discharge: 2014-04-08 | Disposition: A | Discharge status: Home / Self Care | Payer: Medicare Other | Source: Ambulatory Visit | Attending: Cardiology | Admitting: Cardiology

## 2014-04-08 ENCOUNTER — Ambulatory Visit (INDEPENDENT_AMBULATORY_CARE_PROVIDER_SITE_OTHER): Payer: Medicare Other | Admitting: Pulmonary Disease

## 2014-04-08 VITALS — BP 128/82 | HR 57 | Temp 97.0°F | Ht 72.0 in | Wt 193.6 lb

## 2014-04-08 DIAGNOSIS — I4891 Unspecified atrial fibrillation: Secondary | ICD-10-CM | POA: Diagnosis not present

## 2014-04-08 DIAGNOSIS — I480 Paroxysmal atrial fibrillation: Secondary | ICD-10-CM

## 2014-04-08 DIAGNOSIS — I2510 Atherosclerotic heart disease of native coronary artery without angina pectoris: Secondary | ICD-10-CM | POA: Diagnosis not present

## 2014-04-08 DIAGNOSIS — I10 Essential (primary) hypertension: Secondary | ICD-10-CM | POA: Insufficient documentation

## 2014-04-08 DIAGNOSIS — R0609 Other forms of dyspnea: Secondary | ICD-10-CM | POA: Diagnosis not present

## 2014-04-08 DIAGNOSIS — IMO0002 Reserved for concepts with insufficient information to code with codable children: Secondary | ICD-10-CM

## 2014-04-08 DIAGNOSIS — I059 Rheumatic mitral valve disease, unspecified: Secondary | ICD-10-CM

## 2014-04-08 DIAGNOSIS — G4750 Parasomnia, unspecified: Secondary | ICD-10-CM

## 2014-04-08 DIAGNOSIS — R0602 Shortness of breath: Secondary | ICD-10-CM | POA: Insufficient documentation

## 2014-04-08 DIAGNOSIS — Z9989 Dependence on other enabling machines and devices: Principal | ICD-10-CM

## 2014-04-08 DIAGNOSIS — R0600 Dyspnea, unspecified: Secondary | ICD-10-CM

## 2014-04-08 DIAGNOSIS — G4733 Obstructive sleep apnea (adult) (pediatric): Secondary | ICD-10-CM | POA: Diagnosis not present

## 2014-04-08 DIAGNOSIS — E785 Hyperlipidemia, unspecified: Secondary | ICD-10-CM | POA: Diagnosis not present

## 2014-04-08 DIAGNOSIS — R0989 Other specified symptoms and signs involving the circulatory and respiratory systems: Secondary | ICD-10-CM

## 2014-04-08 NOTE — Assessment & Plan Note (Signed)
He is using CPAP and compliant with this.  Will get his download.  Advised him to d/w his DME about mask fit.

## 2014-04-08 NOTE — Progress Notes (Signed)
2D Echocardiogram Complete.  04/08/2014   Ravis Herne, RDCS

## 2014-04-08 NOTE — Patient Instructions (Signed)
Will get CPAP report and call with results Follow up in 6 months 

## 2014-04-08 NOTE — Progress Notes (Signed)
Chief Complaint  Patient presents with  . Follow-up    Pt reports recently having Cardioversion. Reports using CPAP x 4hrs per night. C/o mask leaking. Pt c/o increased fatigue since Cardioversion.    History of Present Illness: [name] is a 80 y.o. male with NREM parasomnia and OSA.  He had cardioversion recently.  Still feels weak.  He uses CPAP, but mask makes noise and comes off.  CPAP helps when he uses.  He uses klonopin at night >> no abnormal behaviors.  TESTS: PSG 12/08/05 >> AHI 0.8, SpO2 low 85%, UARS, Central events with sleep onset PSG 09/26/06 >> AHI 0.8, SpO2 low 89%, UARS, Central events with sleep onset ASV titration 01/07/07 PSG 11/14/12 >> AHI 3, SpO2 low 89%, PLMI 0.  Reduced sleep time, no R or S sleep PSG 12/12/12 >> AHI 8.2, SpO2 low 88%, Supine AHI 25.4. BiPAP 11/13/12 to 11/21/12 >> Used on 9 of 9 nights with average 7 hrs 6 min.  Median IPAP 12 cm H2O, 95 th percentile IPAP 16 cm H2O.  Median EPAP 7 cm H2O, 95 th percentile EPAP 7 cm H2O. CPAP 01/02/13 to 03/02/13 >> Used on 59 of 60 nights with average 6 hrs 47 min. Average AHI 0.8 with median PAP 14 cm H2O and 95 th percentile PAP 15 cm H2O.  PMHx, PSHx, Medications, Allergies, Fhx, Shx reviewed.  Physical Exam:  General - No distress ENT - No sinus tenderness, no oral exudate, no LAN Cardiac - s1s2 regular, no murmur Chest - No wheeze/rales/dullness Back - No focal tenderness Abd - Soft, non-tender Ext - No edema Neuro - Normal strength Skin - No rashes Psych - normal mood, and behavior  Assessment/Plan:  Chesley Mires, MD [facility] Pager:  [phone]

## 2014-04-08 NOTE — Assessment & Plan Note (Signed)
He has a NREM parasomnia controlled on klonopin that he gets from the [place].

## 2014-04-15 ENCOUNTER — Ambulatory Visit: Payer: Medicare Other | Admitting: Internal Medicine

## 2014-04-15 ENCOUNTER — Ambulatory Visit (HOSPITAL_COMMUNITY): Payer: Medicare Other

## 2014-04-29 ENCOUNTER — Ambulatory Visit (INDEPENDENT_AMBULATORY_CARE_PROVIDER_SITE_OTHER): Payer: Medicare Other | Admitting: Pharmacist Clinician (PhC)/ Clinical Pharmacy Specialist

## 2014-04-29 ENCOUNTER — Telehealth: Payer: Self-pay | Admitting: Pulmonary Disease

## 2014-04-29 DIAGNOSIS — I4891 Unspecified atrial fibrillation: Secondary | ICD-10-CM | POA: Diagnosis not present

## 2014-04-29 DIAGNOSIS — Z5181 Encounter for therapeutic drug level monitoring: Secondary | ICD-10-CM | POA: Diagnosis not present

## 2014-04-29 DIAGNOSIS — I480 Paroxysmal atrial fibrillation: Secondary | ICD-10-CM | POA: Diagnosis not present

## 2014-04-29 DIAGNOSIS — Z7901 Long term (current) use of anticoagulants: Secondary | ICD-10-CM

## 2014-04-29 LAB — POCT INR: INR: 2.4

## 2014-04-29 NOTE — Telephone Encounter (Signed)
Results have been explained to patient, pt expressed understanding. Nothing further needed.  

## 2014-04-29 NOTE — Telephone Encounter (Signed)
Auto CPAP 01/15/14 to 04/14/14 >> used on 78 of 90 nights with average 3 hrs and 33 min.  Average AHI is 0.5 with median CPAP 11 cm H2O and 95 th percentile CPAP 14 cm H20.   Will have my nurse inform pt that CPAP report shows good control of sleep apnea.  No change to current set up needed.

## 2014-06-05 ENCOUNTER — Ambulatory Visit (INDEPENDENT_AMBULATORY_CARE_PROVIDER_SITE_OTHER): Payer: Medicare Other | Admitting: Pharmacist Clinician (PhC)/ Clinical Pharmacy Specialist

## 2014-06-05 DIAGNOSIS — I4891 Unspecified atrial fibrillation: Secondary | ICD-10-CM | POA: Diagnosis not present

## 2014-06-05 DIAGNOSIS — I480 Paroxysmal atrial fibrillation: Secondary | ICD-10-CM | POA: Diagnosis not present

## 2014-06-05 DIAGNOSIS — Z5181 Encounter for therapeutic drug level monitoring: Secondary | ICD-10-CM

## 2014-06-05 DIAGNOSIS — Z7901 Long term (current) use of anticoagulants: Secondary | ICD-10-CM | POA: Diagnosis not present

## 2014-06-05 LAB — POCT INR: INR: 1.3

## 2014-06-12 DIAGNOSIS — S2020XA Contusion of thorax, unspecified, initial encounter: Secondary | ICD-10-CM | POA: Diagnosis not present

## 2014-06-17 ENCOUNTER — Encounter: Payer: Self-pay | Admitting: Internal Medicine

## 2014-06-17 ENCOUNTER — Ambulatory Visit (INDEPENDENT_AMBULATORY_CARE_PROVIDER_SITE_OTHER): Payer: Medicare Other | Admitting: Internal Medicine

## 2014-06-17 VITALS — BP 102/70 | HR 86 | Temp 97.9°F | Resp 20 | Ht 72.0 in | Wt 201.0 lb

## 2014-06-17 DIAGNOSIS — I482 Chronic atrial fibrillation, unspecified: Secondary | ICD-10-CM

## 2014-06-17 DIAGNOSIS — I10 Essential (primary) hypertension: Secondary | ICD-10-CM

## 2014-06-17 DIAGNOSIS — T148 Other injury of unspecified body region: Secondary | ICD-10-CM | POA: Diagnosis not present

## 2014-06-17 DIAGNOSIS — T148XXA Other injury of unspecified body region, initial encounter: Secondary | ICD-10-CM

## 2014-06-17 DIAGNOSIS — E039 Hypothyroidism, unspecified: Secondary | ICD-10-CM | POA: Diagnosis not present

## 2014-06-17 DIAGNOSIS — R0789 Other chest pain: Secondary | ICD-10-CM | POA: Diagnosis not present

## 2014-06-17 DIAGNOSIS — I2510 Atherosclerotic heart disease of native coronary artery without angina pectoris: Secondary | ICD-10-CM | POA: Diagnosis not present

## 2014-06-17 MED ORDER — HYDROCODONE-ACETAMINOPHEN 5-325 MG PO TABS: 1.0000 | ORAL_TABLET | Freq: Four times a day (QID) | ORAL | Status: DC | PRN

## 2014-06-17 NOTE — Patient Instructions (Signed)
You  may move around, but avoid painful motions and activities.  Apply heat  to the sore area for 15 to 20 minutes 3 or 4 times daily for the next two to 3 days.  Limit your sodium (Salt) intake  Return in 6 months for follow-up

## 2014-06-17 NOTE — Progress Notes (Signed)
Subjective:    Patient ID: [name], male    DOB: 1927-04-15, 80 y.o.   MRN: [id number]  HPI 80 year old patient whose chronic medical problems include hypothyroidism, essential hypertension and atrial fibrillation.  He was seen recently at the urgent care after a fall sustaining trauma to the back region.  He fell down steps, sustaining trauma to his upper back region.  Radiographs were taken without acute fracture.  Trauma occurred about 5 days ago, but he is still quite symptomatic.  He has been using tramadol for pain.  Past Medical History  Diagnosis Date  . HYPOTHYROIDISM 05/20/2007  . HYPERLIPIDEMIA 10/20/2008  . DEPRESSION 12/17/2007  . MITRAL REGURGITATION 10/20/2008  . HYPERTENSION 04/10/2007  . CORONARY ARTERY DISEASE     a. s/p stent OM 2002. b. s/p BMS 2006 RCA  c. s/p cath 2007.  [name] GERD 04/10/2007  . DIVERTICULOSIS, COLON 04/10/2007  . BENIGN PROSTATIC HYPERTROPHY 04/10/2007  . GYNECOMASTIA, UNILATERAL 03/23/2010  . SLEEP APNEA 05/20/2007  . WEAKNESS 11/09/2009  . Hematoma of abdominal wall   . Clotting disorder     anticoag. therapy  . Mallory - Weiss tear     > 5 years ago  . Barrett's esophagus   . Popliteal artery embolism, right   . History of echocardiogram     a. EF 55% (TEE 08/2011)  . Paroxysmal a-fib     a. chronic amiodarone and coumadin;  b. 05/2013 s/p DCCV.    History   Social History  . Marital Status: Married    Spouse Name: N/A    Number of Children: 1  . Years of Education: N/A   Occupational History  . retired    Social History Main Topics  . Smoking status: Never Smoker   . Smokeless tobacco: Never Used  . Alcohol Use: No  . Drug Use: No  . Sexual Activity: Not on file   Other Topics Concern  . Not on file   Social History Narrative   He is retired from Landscape [employer].   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  Father died in his 62's.  No CAD.    Past Surgical History  Procedure Laterality Date  . Transurethral resection of prostate    .  Coronary angioplasty with stent placement    . Tee with cardioversion  7/08  . Cardioversion  8/08  . Esophagogastroduodenoscopy    . Tee without cardioversion  09/14/2011    Procedure: TRANSESOPHAGEAL ECHOCARDIOGRAM (TEE);  Surgeon: Josue Hector, MD;  Location: [place];  Service: Cardiovascular;  Laterality: N/A;  . Cardioversion  09/14/2011    Procedure: CARDIOVERSION;  Surgeon: Josue Hector, MD;  Location: [facility] ENDOSCOPY;  Service: Cardiovascular;  Laterality: N/A;  . Cardioversion N/A 03/23/2014    Procedure: CARDIOVERSION;  Surgeon: Thompson Grayer, MD;  Location: [facility] OR;  Service: Cardiovascular;  Laterality: N/A;    Family History  Problem Relation Age of Onset  . Hypertension Other   . Heart disease Brother     CAD male 1st degree relative    No Known Allergies  Current Outpatient Prescriptions on File Prior to Visit  Medication Sig Dispense Refill  . amiodarone (PACERONE) 200 MG tablet Take 200 mg by mouth daily.    . clonazePAM (KLONOPIN) 1 MG tablet Take 1 mg by mouth at bedtime.      [name] DIGESTIVE ENZYMES PO Take 1 tablet by mouth every evening.     . diltiazem (CARDIZEM CD) 120 MG 24 hr capsule  Take 1 capsule (120 mg total) by mouth daily. 30 capsule 11  . ferrous sulfate 325 (65 FE) MG tablet Take 325 mg by mouth 2 (two) times daily with a meal.    . fish oil-omega-3 fatty acids 1000 MG capsule Take 1 g by mouth 2 (two) times daily.     . hydroxypropyl methylcellulose (ISOPTO TEARS) 2.5 % ophthalmic solution Place 1 drop into both eyes 3 (three) times daily as needed for dry eyes.     [name] levothyroxine (SYNTHROID, LEVOTHROID) 25 MCG tablet Take 25 mcg by mouth daily.     . Multiple Vitamin (MULTIVITAMIN) capsule Take 1 capsule by mouth daily.    . nitroGLYCERIN (NITROSTAT) 0.4 MG SL tablet Place 0.4 mg under the tongue every 5 (five) minutes as needed for chest pain.     . pantoprazole (PROTONIX) 40 MG tablet Take 40 mg by mouth 2 (two) times daily.     . pravastatin  (PRAVACHOL) 40 MG tablet Take 40 mg by mouth every evening.    . traZODone (DESYREL) 50 MG tablet Take 50-100 mg by mouth at bedtime as needed for sleep.    [name] warfarin (COUMADIN) 5 MG tablet Take 2.5-5 mg by mouth daily. 5 mg tue, thur, sat and 2.5 mg all other days     No current facility-administered medications on file prior to visit.    BP 102/70 mmHg  Pulse 86  Temp(Src) 97.9 F (36.6 C) (Oral)  Resp 20  Ht 6' (1.829 m)  Wt 201 lb (91.173 kg)  BMI 27.25 kg/m2  SpO2 97%      Review of Systems  Constitutional: Positive for fatigue. Negative for fever, chills and appetite change.  HENT: Negative for congestion, dental problem, ear pain, hearing loss, sore throat, tinnitus, trouble swallowing and voice change.   Eyes: Negative for pain, discharge and visual disturbance.  Respiratory: Negative for cough, chest tightness, wheezing and stridor.   Cardiovascular: Negative for chest pain, palpitations and leg swelling.  Gastrointestinal: Negative for nausea, vomiting, abdominal pain, diarrhea, constipation, blood in stool and abdominal distention.  Genitourinary: Negative for urgency, hematuria, flank pain, discharge, difficulty urinating and genital sores.  Musculoskeletal: Positive for back pain and gait problem. Negative for myalgias, joint swelling, arthralgias and neck stiffness.  Skin: Negative for rash.  Neurological: Positive for weakness. Negative for dizziness, syncope, speech difficulty, numbness and headaches.  Hematological: Negative for adenopathy. Does not bruise/bleed easily.  Psychiatric/Behavioral: Negative for behavioral problems and dysphoric mood. The patient is not nervous/anxious.        Objective:   Physical Exam  Constitutional: He is oriented to person, place, and time. He appears well-developed.  Blood pressure low normal  HENT:  Head: Normocephalic.  Right Ear: External ear normal.  Left Ear: External ear normal.  Eyes: Conjunctivae and EOM are  normal.  Neck: Normal range of motion.  Cardiovascular: Normal rate and normal heart sounds.   Controlled ventricular response  Pulmonary/Chest: Effort normal. He has rales.  Small abrasion over the right mid back area.  No ecchymoses  Abdominal: Bowel sounds are normal.  Musculoskeletal: Normal range of motion. He exhibits no edema or tenderness.  Neurological: He is alert and oriented to person, place, and time.  Psychiatric: He has a normal mood and affect. His behavior is normal.          Assessment and  Plan:   Upper back pain secondary to trauma Chronic atrial fibrillation.  Continue rate control medication as well as chronic  Coumadin anticoagulation Hypertension, well-controlled Weakness/unsteady gait

## 2014-06-17 NOTE — Progress Notes (Signed)
Pre visit review using our clinic review tool, if applicable. No additional management support is needed unless otherwise documented below in the visit note. 

## 2014-06-19 ENCOUNTER — Telehealth: Payer: Self-pay | Admitting: Cardiology

## 2014-06-19 ENCOUNTER — Emergency Department (HOSPITAL_COMMUNITY): Payer: Medicare Other

## 2014-06-19 ENCOUNTER — Inpatient Hospital Stay (HOSPITAL_COMMUNITY)
Admission: EM | Admit: 2014-06-19 | Discharge: 2014-06-23 | DRG: 310 | Disposition: A | Discharge status: Home / Self Care | Payer: Medicare Other | Attending: Cardiology | Admitting: Cardiology

## 2014-06-19 ENCOUNTER — Encounter (HOSPITAL_COMMUNITY): Payer: Self-pay | Admitting: *Deleted

## 2014-06-19 DIAGNOSIS — E785 Hyperlipidemia, unspecified: Secondary | ICD-10-CM | POA: Diagnosis present

## 2014-06-19 DIAGNOSIS — F329 Major depressive disorder, single episode, unspecified: Secondary | ICD-10-CM | POA: Diagnosis present

## 2014-06-19 DIAGNOSIS — I340 Nonrheumatic mitral (valve) insufficiency: Secondary | ICD-10-CM | POA: Diagnosis not present

## 2014-06-19 DIAGNOSIS — I4891 Unspecified atrial fibrillation: Secondary | ICD-10-CM | POA: Diagnosis not present

## 2014-06-19 DIAGNOSIS — Z955 Presence of coronary angioplasty implant and graft: Secondary | ICD-10-CM | POA: Diagnosis not present

## 2014-06-19 DIAGNOSIS — Z79899 Other long term (current) drug therapy: Secondary | ICD-10-CM

## 2014-06-19 DIAGNOSIS — R531 Weakness: Secondary | ICD-10-CM | POA: Diagnosis not present

## 2014-06-19 DIAGNOSIS — R404 Transient alteration of awareness: Secondary | ICD-10-CM | POA: Diagnosis not present

## 2014-06-19 DIAGNOSIS — I2510 Atherosclerotic heart disease of native coronary artery without angina pectoris: Secondary | ICD-10-CM | POA: Diagnosis not present

## 2014-06-19 DIAGNOSIS — Z7901 Long term (current) use of anticoagulants: Secondary | ICD-10-CM

## 2014-06-19 DIAGNOSIS — I480 Paroxysmal atrial fibrillation: Principal | ICD-10-CM | POA: Diagnosis present

## 2014-06-19 DIAGNOSIS — Z79891 Long term (current) use of opiate analgesic: Secondary | ICD-10-CM

## 2014-06-19 DIAGNOSIS — M549 Dorsalgia, unspecified: Secondary | ICD-10-CM | POA: Diagnosis not present

## 2014-06-19 DIAGNOSIS — R0602 Shortness of breath: Secondary | ICD-10-CM | POA: Diagnosis not present

## 2014-06-19 DIAGNOSIS — K219 Gastro-esophageal reflux disease without esophagitis: Secondary | ICD-10-CM | POA: Diagnosis present

## 2014-06-19 DIAGNOSIS — E039 Hypothyroidism, unspecified: Secondary | ICD-10-CM | POA: Diagnosis not present

## 2014-06-19 DIAGNOSIS — R001 Bradycardia, unspecified: Secondary | ICD-10-CM | POA: Diagnosis present

## 2014-06-19 DIAGNOSIS — I482 Chronic atrial fibrillation, unspecified: Secondary | ICD-10-CM

## 2014-06-19 DIAGNOSIS — I10 Essential (primary) hypertension: Secondary | ICD-10-CM | POA: Diagnosis present

## 2014-06-19 DIAGNOSIS — Z9861 Coronary angioplasty status: Secondary | ICD-10-CM

## 2014-06-19 HISTORY — DX: Nonrheumatic mitral (valve) insufficiency: I34.0

## 2014-06-19 HISTORY — DX: Long term (current) use of anticoagulants: Z79.01

## 2014-06-19 LAB — CBC
HEMATOCRIT: 36.5 % — AB (ref 39.0–52.0)
Hemoglobin: 12.3 g/dL — ABNORMAL LOW (ref 13.0–17.0)
MCH: 31.3 pg (ref 26.0–34.0)
MCHC: 33.7 g/dL (ref 30.0–36.0)
MCV: 92.9 fL (ref 78.0–100.0)
Platelets: 185 10*3/uL (ref 150–400)
RBC: 3.93 MIL/uL — ABNORMAL LOW (ref 4.22–5.81)
RDW: 14.5 % (ref 11.5–15.5)
WBC: 7 10*3/uL (ref 4.0–10.5)

## 2014-06-19 LAB — PROTIME-INR
INR: 2.68 — ABNORMAL HIGH (ref 0.00–1.49)
Prothrombin Time: 28.7 seconds — ABNORMAL HIGH (ref 11.6–15.2)

## 2014-06-19 MED ORDER — SODIUM CHLORIDE 0.9 % IV SOLN: 250.0000 mL | INTRAVENOUS | Status: DC | PRN

## 2014-06-19 MED ORDER — PANTOPRAZOLE SODIUM 40 MG PO TBEC
40.0000 mg | DELAYED_RELEASE_TABLET | Freq: Two times a day (BID) | ORAL | Status: DC
  Administered 2014-06-19 – 2014-06-23 (×7): 40 mg via ORAL
  Filled 2014-06-19 (×5): qty 1

## 2014-06-19 MED ORDER — DIGESTIVE ENZYMES PO TABS: ORAL_TABLET | Freq: Two times a day (BID) | ORAL | Status: DC

## 2014-06-19 MED ORDER — CLONAZEPAM 1 MG PO TABS
1.0000 mg | ORAL_TABLET | Freq: Every day | ORAL | Status: DC
  Administered 2014-06-19 – 2014-06-22 (×4): 1 mg via ORAL
  Filled 2014-06-19 (×3): qty 1

## 2014-06-19 MED ORDER — MULTIVITAMINS PO CAPS: 1.0000 | ORAL_CAPSULE | Freq: Every day | ORAL | Status: DC

## 2014-06-19 MED ORDER — ADULT MULTIVITAMIN W/MINERALS CH
1.0000 | ORAL_TABLET | Freq: Every day | ORAL | Status: DC
  Administered 2014-06-20 – 2014-06-23 (×3): 1 via ORAL
  Filled 2014-06-19 (×4): qty 1

## 2014-06-19 MED ORDER — LEVOTHYROXINE SODIUM 25 MCG PO TABS
25.0000 ug | ORAL_TABLET | Freq: Every day | ORAL | Status: DC
  Administered 2014-06-20 – 2014-06-23 (×4): 25 ug via ORAL
  Filled 2014-06-19 (×5): qty 1

## 2014-06-19 MED ORDER — WARFARIN - PHARMACIST DOSING INPATIENT: Freq: Every day | Status: DC

## 2014-06-19 MED ORDER — WARFARIN SODIUM 2.5 MG PO TABS
2.5000 mg | ORAL_TABLET | Freq: Once | ORAL | Status: AC
  Administered 2014-06-19: 2.5 mg via ORAL
  Filled 2014-06-19: qty 1

## 2014-06-19 MED ORDER — ALPRAZOLAM 0.25 MG PO TABS: 0.2500 mg | ORAL_TABLET | Freq: Two times a day (BID) | ORAL | Status: DC | PRN

## 2014-06-19 MED ORDER — FERROUS SULFATE 325 (65 FE) MG PO TABS
325.0000 mg | ORAL_TABLET | Freq: Two times a day (BID) | ORAL | Status: DC
  Administered 2014-06-19 – 2014-06-23 (×6): 325 mg via ORAL
  Filled 2014-06-19 (×12): qty 1

## 2014-06-19 MED ORDER — AMIODARONE HCL IN DEXTROSE 360-4.14 MG/200ML-% IV SOLN
30.0000 mg/h | INTRAVENOUS | Status: DC
  Administered 2014-06-20 – 2014-06-22 (×5): 30 mg/h via INTRAVENOUS
  Filled 2014-06-19 (×13): qty 200

## 2014-06-19 MED ORDER — NITROGLYCERIN 0.4 MG SL SUBL: 0.4000 mg | SUBLINGUAL_TABLET | SUBLINGUAL | Status: DC | PRN

## 2014-06-19 MED ORDER — ACETAMINOPHEN 325 MG PO TABS: 650.0000 mg | ORAL_TABLET | ORAL | Status: DC | PRN

## 2014-06-19 MED ORDER — AMIODARONE LOAD VIA INFUSION
150.0000 mg | Freq: Once | INTRAVENOUS | Status: AC
  Administered 2014-06-19: 150 mg via INTRAVENOUS
  Filled 2014-06-19: qty 83.34

## 2014-06-19 MED ORDER — PRAVASTATIN SODIUM 40 MG PO TABS
40.0000 mg | ORAL_TABLET | Freq: Every evening | ORAL | Status: DC
  Administered 2014-06-19 – 2014-06-22 (×4): 40 mg via ORAL
  Filled 2014-06-19 (×5): qty 1

## 2014-06-19 MED ORDER — ZOLPIDEM TARTRATE 5 MG PO TABS: 5.0000 mg | ORAL_TABLET | Freq: Every evening | ORAL | Status: DC | PRN

## 2014-06-19 MED ORDER — SODIUM CHLORIDE 0.9 % IJ SOLN: 3.0000 mL | INTRAMUSCULAR | Status: DC | PRN

## 2014-06-19 MED ORDER — HYPROMELLOSE (GONIOSCOPIC) 2.5 % OP SOLN: 1.0000 [drp] | Freq: Three times a day (TID) | OPHTHALMIC | Status: DC | PRN

## 2014-06-19 MED ORDER — SODIUM CHLORIDE 0.9 % IJ SOLN
3.0000 mL | Freq: Two times a day (BID) | INTRAMUSCULAR | Status: DC
  Administered 2014-06-22: 3 mL via INTRAVENOUS

## 2014-06-19 MED ORDER — AMIODARONE HCL IN DEXTROSE 360-4.14 MG/200ML-% IV SOLN
60.0000 mg/h | INTRAVENOUS | Status: AC
  Administered 2014-06-19 (×2): 60 mg/h via INTRAVENOUS
  Filled 2014-06-19: qty 200

## 2014-06-19 MED ORDER — ONDANSETRON HCL 4 MG/2ML IJ SOLN
4.0000 mg | Freq: Four times a day (QID) | INTRAMUSCULAR | Status: DC | PRN
  Administered 2014-06-21: 4 mg via INTRAVENOUS
  Filled 2014-06-19: qty 2

## 2014-06-19 MED ORDER — TRAZODONE HCL 50 MG PO TABS
50.0000 mg | ORAL_TABLET | Freq: Every evening | ORAL | Status: DC | PRN
  Administered 2014-06-19 – 2014-06-20 (×2): 50 mg via ORAL
  Filled 2014-06-19 (×4): qty 2

## 2014-06-19 MED ORDER — HYDROCODONE-ACETAMINOPHEN 5-325 MG PO TABS
1.0000 | ORAL_TABLET | Freq: Four times a day (QID) | ORAL | Status: DC | PRN
Start: 2014-06-19 — End: 2014-06-23
  Administered 2014-06-20: 1 via ORAL

## 2014-06-19 NOTE — Progress Notes (Addendum)
ANTICOAGULATION CONSULT NOTE - Initial Consult  Pharmacy Consult for coumadin Indication: atrial fibrillation  No Known Allergies  Patient Measurements:   Heparin Dosing Weight:   Vital Signs: Temp: 98.3 F (36.8 C) (12/04 1300) Temp Source: Oral (12/04 1300) BP: 127/90 mmHg (12/04 1454) Pulse Rate: 104 (12/04 1454)  Labs:  Recent Labs  06/19/14 1305  HGB 12.3*  HCT 36.5*  PLT 185    Estimated Creatinine Clearance: 39.5 mL/min (by C-G formula based on Cr of 1.5).   Medical History: Past Medical History  Diagnosis Date  . HYPOTHYROIDISM 05/20/2007  . HYPERLIPIDEMIA 10/20/2008  . DEPRESSION 12/17/2007  . MITRAL REGURGITATION 10/20/2008  . HYPERTENSION 04/10/2007  . CORONARY ARTERY DISEASE     a. s/p stent OM 2002. b. s/p BMS 2006 RCA  c. s/p cath 2007.  [name] GERD 04/10/2007  . DIVERTICULOSIS, COLON 04/10/2007  . BENIGN PROSTATIC HYPERTROPHY 04/10/2007  . GYNECOMASTIA, UNILATERAL 03/23/2010  . SLEEP APNEA 05/20/2007  . WEAKNESS 11/09/2009  . Hematoma of abdominal wall   . Clotting disorder     anticoag. therapy  . Mallory - Weiss tear     > 5 years ago  . Barrett's esophagus   . Popliteal artery embolism, right   . History of echocardiogram     a. EF 55% (TEE 08/2011)  . Paroxysmal a-fib     a. chronic amiodarone and coumadin;  b. 05/2013 s/p DCCV.  [name] Chronic anticoagulation     Coumadin    Medications:   (Not in a hospital admission) Scheduled:  . clonazePAM  1 mg Oral QHS  . Digestive Enzymes   Oral BID  . ferrous sulfate  325 mg Oral BID WC  . levothyroxine  25 mcg Oral Daily  . multivitamin  1 capsule Oral Daily  . pantoprazole  40 mg Oral BID  . pravastatin  40 mg Oral QPM    Assessment: 80 yo who presented with increase HR and afib related symptoms. He has been on coumadin outpt. His INR on admission is pending. Will cont coumadin here when INR is back.   Goal of Therapy:  INR 2-3 Monitor platelets by anticoagulation protocol: Yes   Plan:   F/u with  baseline INR and dose coumadin  Onnie Boer, PharmD Pager: [phone] 06/19/2014 5:24 PM  Addendum:  INR is therapeutic at 2.68.  Plan: 1. Warfarin 2.5mg  PO x 1 tonight (pts normal home dose for Friday) 2. Daily INR  Salome Arnt, PharmD, BCPS Pager # [phone] 06/19/2014 6:53 PM

## 2014-06-19 NOTE — ED Provider Notes (Signed)
CSN: [id number]     Arrival date & time 06/19/14  1245 History   First MD Initiated Contact with Patient 06/19/14 1324     Chief Complaint  Patient presents with  . Atrial Fibrillation     (Consider location/radiation/quality/duration/timing/severity/associated sxs/prior Treatment) Patient is a 80 y.o. male presenting with atrial fibrillation.  Atrial Fibrillation   Patient reports generalized weakness onset this morning and feels his heart is "out of rhythm" he denies shortness of breath no chest pain no treatment prior to coming here no other associated symptoms. Nothing makes symptoms better or worse. He called his cardiologist who advised him to come to the emergency department for further evaluation. No treatment prior to coming here. Patient has had multiple cardioversions this year Past Medical History  Diagnosis Date  . HYPOTHYROIDISM 05/20/2007  . HYPERLIPIDEMIA 10/20/2008  . DEPRESSION 12/17/2007  . MITRAL REGURGITATION 10/20/2008  . HYPERTENSION 04/10/2007  . CORONARY ARTERY DISEASE     a. s/p stent OM 2002. b. s/p BMS 2006 RCA  c. s/p cath 2007.  [name] GERD 04/10/2007  . DIVERTICULOSIS, COLON 04/10/2007  . BENIGN PROSTATIC HYPERTROPHY 04/10/2007  . GYNECOMASTIA, UNILATERAL 03/23/2010  . SLEEP APNEA 05/20/2007  . WEAKNESS 11/09/2009  . Hematoma of abdominal wall   . Clotting disorder     anticoag. therapy  . Mallory - Weiss tear     > 5 years ago  . Barrett's esophagus   . Popliteal artery embolism, right   . History of echocardiogram     a. EF 55% (TEE 08/2011)  . Paroxysmal a-fib     a. chronic amiodarone and coumadin;  b. 05/2013 s/p DCCV.   Past Surgical History  Procedure Laterality Date  . Transurethral resection of prostate    . Coronary angioplasty with stent placement    . Tee with cardioversion  7/08  . Cardioversion  8/08  . Esophagogastroduodenoscopy    . Tee without cardioversion  09/14/2011    Procedure: TRANSESOPHAGEAL ECHOCARDIOGRAM (TEE);  Surgeon: Josue Hector, MD;  Location: [place];  Service: Cardiovascular;  Laterality: N/A;  . Cardioversion  09/14/2011    Procedure: CARDIOVERSION;  Surgeon: Josue Hector, MD;  Location: [facility] ENDOSCOPY;  Service: Cardiovascular;  Laterality: N/A;  . Cardioversion N/A 03/23/2014    Procedure: CARDIOVERSION;  Surgeon: Thompson Grayer, MD;  Location: [facility] OR;  Service: Cardiovascular;  Laterality: N/A;   Family History  Problem Relation Age of Onset  . Hypertension Other   . Heart disease Brother     CAD male 1st degree relative   History  Substance Use Topics  . Smoking status: Never Smoker   . Smokeless tobacco: Never Used  . Alcohol Use: No    Review of Systems  HENT: Negative.   Respiratory: Negative.   Cardiovascular: Negative.   Gastrointestinal: Negative.   Musculoskeletal: Negative.   Skin: Negative.   Neurological: Positive for weakness.  Psychiatric/Behavioral: Negative.   All other systems reviewed and are negative.     Allergies  Review of patient's allergies indicates no known allergies.  Home Medications   Prior to Admission medications   Medication Sig Start Date End Date Taking? Authorizing Provider  amiodarone (PACERONE) 200 MG tablet Take 200 mg by mouth daily.    Historical Provider, MD  clonazePAM (KLONOPIN) 1 MG tablet Take 1 mg by mouth at bedtime.      Historical Provider, MD  DIGESTIVE ENZYMES PO Take 1 tablet by mouth every evening.     Historical Provider,  MD  diltiazem (CARDIZEM CD) 120 MG 24 hr capsule Take 1 capsule (120 mg total) by mouth daily. 03/24/14   Almyra Deforest, PA  ferrous sulfate 325 (65 FE) MG tablet Take 325 mg by mouth 2 (two) times daily with a meal.    Historical Provider, MD  fish oil-omega-3 fatty acids 1000 MG capsule Take 1 g by mouth 2 (two) times daily.     Historical Provider, MD  HYDROcodone-acetaminophen (NORCO/VICODIN) 5-325 MG per tablet Take 1 tablet by mouth every 6 (six) hours as needed for moderate pain. 06/17/14   Marletta Lor, MD   hydroxypropyl methylcellulose (ISOPTO TEARS) 2.5 % ophthalmic solution Place 1 drop into both eyes 3 (three) times daily as needed for dry eyes.     Historical Provider, MD  levothyroxine (SYNTHROID, LEVOTHROID) 25 MCG tablet Take 25 mcg by mouth daily.     Historical Provider, MD  Multiple Vitamin (MULTIVITAMIN) capsule Take 1 capsule by mouth daily.    Historical Provider, MD  nitroGLYCERIN (NITROSTAT) 0.4 MG SL tablet Place 0.4 mg under the tongue every 5 (five) minutes as needed for chest pain.     Historical Provider, MD  pantoprazole (PROTONIX) 40 MG tablet Take 40 mg by mouth 2 (two) times daily.     Historical Provider, MD  pravastatin (PRAVACHOL) 40 MG tablet Take 40 mg by mouth every evening. 03/07/11   Imogene Burn, PA-C  traMADol (ULTRAM) 50 MG tablet Take 50 mg by mouth every 6 (six) hours as needed.  06/12/14   Historical Provider, MD  traZODone (DESYREL) 50 MG tablet Take 50-100 mg by mouth at bedtime as needed for sleep.    Historical Provider, MD  warfarin (COUMADIN) 5 MG tablet Take 2.5-5 mg by mouth daily. 5 mg tue, thur, sat and 2.5 mg all other days    Historical Provider, MD   BP 118/74 mmHg  Pulse 101  Temp(Src) 98.3 F (36.8 C) (Oral)  Resp 12  SpO2 99% Physical Exam  Constitutional: He appears well-developed and well-nourished.  HENT:  Head: Normocephalic and atraumatic.  Eyes: Conjunctivae are normal. Pupils are equal, round, and reactive to light.  Neck: Neck supple. No tracheal deviation present. No thyromegaly present.  Cardiovascular: Normal rate.   No murmur heard. Irregularly irregular  Pulmonary/Chest: Effort normal and breath sounds normal.  Abdominal: Soft. Bowel sounds are normal. He exhibits no distension. There is no tenderness.  Musculoskeletal: Normal range of motion. He exhibits no edema or tenderness.  Neurological: He is alert. Coordination normal.  Skin: Skin is warm and dry. No rash noted.  Psychiatric: He has a normal mood and affect.   Nursing note and vitals reviewed.   ED Course  Procedures (including critical care time) Labs Review Labs Reviewed  CBC - Abnormal; Notable for the following:    RBC 3.93 (*)    Hemoglobin 12.3 (*)    HCT 36.5 (*)    All other components within normal limits  I-STAT CHEM 8, ED  I-STAT TROPOININ, ED    Imaging Review No results found.   EKG Interpretation None      Date: 06/19/2014  Rate: 90  Rhythm: atrial fibrillation  QRS Axis: left  Intervals: normal  ST/T Wave abnormalities: nonspecific T wave changes  Conduction Disutrbances:none  Narrative Interpretation:   Old EKG Reviewed: none available MUSE system down Results for orders placed or performed during the hospital encounter of 06/19/14  CBC  Result Value Ref Range   WBC 7.0 4.0 -  10.5 K/uL   RBC 3.93 (L) 4.22 - 5.81 MIL/uL   Hemoglobin 12.3 (L) 13.0 - 17.0 g/dL   HCT 36.5 (L) 39.0 - 52.0 %   MCV 92.9 78.0 - 100.0 fL   MCH 31.3 26.0 - 34.0 pg   MCHC 33.7 30.0 - 36.0 g/dL   RDW 14.5 11.5 - 15.5 %   Platelets 185 150 - 400 K/uL   No results found.  MDM  [facility] called to evaluate patient and will arrange for disposition Final diagnoses:  SOB (shortness of breath)   Diagnosis atrial fibrillation     Orlie Dakin, MD 06/19/14 1437

## 2014-06-19 NOTE — ED Notes (Addendum)
Woke up this morning with sob, dizziness and tachycardia. EMS: 12 lead ekg showed hr in 140-160's; EMS admin. 10 mg Cardizem IVP, and effective; HR range (90's to 110's). No change in rhythm - Afib. Pt. Alert and oriented. Pt. Did fall last Friday. No deformities or bruising. Sm. Area of abrasion to the rt. Upper post. Back. Pt. Denies any fevers or stressors. Pt. Is on amiodarone - no changes in medications.

## 2014-06-19 NOTE — H&P (Signed)
History and Physical   Patient ID: [name] MRN: [id number], DOB/AGE: [birth year] 80 y.o. Date of Encounter: 06/19/2014  Primary Physician: Nyoka Cowden, MD Primary Cardiologist: Dr. Percival Spanish  Chief Complaint:  Atrial fib, RVR  HPI: [name] is a 80 y.o. male with a history of CAD and PAF on Coumadin. He has been in his usual state of health except for a fall several days ago where he struck his back. He has been having problems with back pain but that is gradually getting better. He has no other complaints. He checks his blood pressure and heart rate daily. His heart rate runs in the 50s at times but he is asymptomatic with this.  Today he checked his blood pressure and heart rate and his blood pressure was okay, but his heart rate was 126. He took a shower and when he was trying to get a shower, had a presyncopal episode. He was also a little short of breath with exertion. He had no chest pain. He had no palpitations but he never gets those. The symptoms he had today reminded him of the ones he had prior to his cardioversion in September 2015. Currently in the ER, his heart rate is 110-120 and he is asymptomatic while at rest.   Past Medical History  Diagnosis Date  . HYPOTHYROIDISM 05/20/2007  . HYPERLIPIDEMIA 10/20/2008  . DEPRESSION 12/17/2007  . MITRAL REGURGITATION 10/20/2008  . HYPERTENSION 04/10/2007  . CORONARY ARTERY DISEASE     a. s/p stent OM 2002. b. s/p BMS 2006 RCA  c. s/p cath 2007.  [name] GERD 04/10/2007  . DIVERTICULOSIS, COLON 04/10/2007  . BENIGN PROSTATIC HYPERTROPHY 04/10/2007  . GYNECOMASTIA, UNILATERAL 03/23/2010  . SLEEP APNEA 05/20/2007  . WEAKNESS 11/09/2009  . Hematoma of abdominal wall   . Clotting disorder     anticoag. therapy  . Mallory - Weiss tear     > 5 years ago  . Caysie Minnifield's esophagus   . Popliteal artery embolism, right   . History of echocardiogram     a. EF 55% (TEE 08/2011)  . Paroxysmal a-fib     a. chronic amiodarone and  coumadin;  b. 05/2013 s/p DCCV.  [name] Chronic anticoagulation     Coumadin    Surgical History:  Past Surgical History  Procedure Laterality Date  . Transurethral resection of prostate    . Coronary angioplasty with stent placement    . Tee with cardioversion  7/08  . Cardioversion  8/08  . Esophagogastroduodenoscopy    . Tee without cardioversion  09/14/2011    Procedure: TRANSESOPHAGEAL ECHOCARDIOGRAM (TEE);  Surgeon: Kevin Hector, MD;  Location: [place];  Service: Cardiovascular;  Laterality: N/A;  . Cardioversion  09/14/2011    Procedure: CARDIOVERSION;  Surgeon: Kevin Hector, MD;  Location: [place];  Service: Cardiovascular;  Laterality: N/A;  . Cardioversion N/A 03/23/2014    Procedure: CARDIOVERSION;  Surgeon: Kevin Grayer, MD;  Location: [place];  Service: Cardiovascular;  Laterality: N/A;     I have reviewed the patient's current medications. Prior to Admission medications   Medication Sig Start Date End Date Taking? Authorizing Provider  amiodarone (PACERONE) 200 MG tablet Take 200 mg by mouth daily.   Yes Historical Provider, MD  clonazePAM (KLONOPIN) 1 MG tablet Take 1 mg by mouth at bedtime.     Yes Historical Provider, MD  DIGESTIVE ENZYMES PO Take 1 tablet by mouth 2 (two) times daily.    Yes Historical  Provider, MD  ferrous sulfate 325 (65 FE) MG tablet Take 325 mg by mouth 2 (two) times daily with a meal.   Yes Historical Provider, MD  fish oil-omega-3 fatty acids 1000 MG capsule Take 1 g by mouth 2 (two) times daily.    Yes Historical Provider, MD  HYDROcodone-acetaminophen (NORCO/VICODIN) 5-325 MG per tablet Take 1 tablet by mouth every 6 (six) hours as needed for moderate pain. 06/17/14  Yes Marletta Lor, MD  hydroxypropyl methylcellulose (ISOPTO TEARS) 2.5 % ophthalmic solution Place 1 drop into both eyes 3 (three) times daily as needed for dry eyes.    Yes Historical Provider, MD  levothyroxine (SYNTHROID, LEVOTHROID) 25 MCG tablet Take 25 mcg by mouth  daily.    Yes Historical Provider, MD  Multiple Vitamin (MULTIVITAMIN) capsule Take 1 capsule by mouth daily.   Yes Historical Provider, MD  nitroGLYCERIN (NITROSTAT) 0.4 MG SL tablet Place 0.4 mg under the tongue every 5 (five) minutes as needed for chest pain.    Yes Historical Provider, MD  pantoprazole (PROTONIX) 40 MG tablet Take 40 mg by mouth 2 (two) times daily.    Yes Historical Provider, MD  pravastatin (PRAVACHOL) 40 MG tablet Take 40 mg by mouth every evening. 03/07/11  Yes Imogene Burn, PA-C  traZODone (DESYREL) 50 MG tablet Take 50-100 mg by mouth at bedtime as needed for sleep.   Yes Historical Provider, MD  warfarin (COUMADIN) 5 MG tablet Take 2.5-5 mg by mouth daily. 5 mg tue, thur, sat and 2.5 mg all other days   Yes Historical Provider, MD  diltiazem (CARDIZEM CD) 120 MG 24 hr capsule Take 1 capsule (120 mg total) by mouth daily. Patient not taking: Reported on 06/19/2014 03/24/14   Kevin Deforest, PA   Allergies: No Known Allergies  History   Social History  . Marital Status: Married    Spouse Name: N/A    Number of Children: 1  . Years of Education: N/A   Occupational History  . retired    Social History Main Topics  . Smoking status: Never Smoker   . Smokeless tobacco: Never Used  . Alcohol Use: No  . Drug Use: No  . Sexual Activity: Not on file   Other Topics Concern  . Not on file   Social History Narrative   He is retired from Landscape [employer].   Mother lived into her 93's.  Father died in his 55's.  No CAD.    Family History  Problem Relation Age of Onset  . Hypertension Other   . Heart disease Brother     CAD male 1st degree relative   Family Status  Relation Status Death Age  . Other Alive     Review of Systems:   Full 14-point review of systems otherwise negative except as noted above.  Physical Exam: Blood pressure 127/90, pulse 104, temperature 98.3 F (36.8 C), temperature source Oral, resp. rate 18, SpO2 100 %. General: Well developed,  well nourished,male in no acute distress. Head: Normocephalic, atraumatic, sclera non-icteric, no xanthomas, nares are without discharge. Dentition:  Neck: No carotid bruits. JVD 8-9 centimeters. No thyromegally Lungs: Good expansion bilaterally. without wheezes or rhonchi. Rales bases Heart: IRRegular rate and rhythm with S1 S2.  No S3 or S4.  No murmur, no rubs, or gallops appreciated. Abdomen: Soft, non-tender, non-distended with normoactive bowel sounds. No hepatomegaly. No rebound/guarding. No obvious abdominal masses. Msk:  Strength and tone appear normal for age. No joint deformities or effusions, no  spine or costo-vertebral angle tenderness. Extremities: No clubbing or cyanosis. No edema.  Distal pedal pulses are 2+ in 4 extrem Neuro: Alert and oriented X 3. Moves all extremities spontaneously. No focal deficits noted. Psych:  Responds to questions appropriately with a normal affect. Skin: No rashes or lesions noted  Labs:   Lab Results  Component Value Date   WBC 7.0 06/19/2014   HGB 12.3* 06/19/2014   HCT 36.5* 06/19/2014   MCV 92.9 06/19/2014   PLT 185 06/19/2014   No results for input(s): INR in the last 72 hours. No results for input(s): NA, K, CL, CO2, BUN, CREATININE, CALCIUM, PROT, BILITOT, ALKPHOS, ALT, AST, GLUCOSE in the last 168 hours.  Invalid input(s): LABALBU No results for input(s): CKTOTAL, CKMB, TROPONINI in the last 72 hours. No results for input(s): TROPIPOC in the last 72 hours. Lab Results  Component Value Date   INR 1.3 06/05/2014   INR 2.4 04/29/2014   INR 2.3 03/31/2014   PROTIME 18.7 12/24/2008   PROTIME 21.6 12/17/2008   Radiology/Studies: Dg Chest 2 View 06/19/2014   CLINICAL DATA:  Initial evaluation for shortness of breath for 1 week, fell 1 week ago and shortness of breath since thin due to irregular heart rate, also a right-sided back pain, also dry cough  EXAM: CHEST  2 VIEW  COMPARISON:  03/20/2014  FINDINGS: Stable moderate cardiac  enlargement. Mild vascular congestion. Mild diffuse interstitial prominence. Tiny right effusion.  IMPRESSION: Findings suggest congestive heart failure with mild interstitial pulmonary edema   Electronically Signed   By: Skipper Cliche M.D.   On: 06/19/2014 15:33    Echo: 04/08/2014 Conclusions Left ventricle: The cavity size was normal. There was mild focal basal hypertrophy of the septum. Systolic function was normal. The estimated ejection fraction was in the range of 55% to 60%. Wall motion was normal; there were no regional wall motion abnormalities. Left ventricular diastolic function parameters were normal. - Aortic valve: There was mild regurgitation. - Mitral valve: There was mild to moderate regurgitation. - Left atrium: The atrium was moderately dilated. - Right ventricle: The cavity size was mildly dilated. Wall thickness was normal. - Right atrium: The atrium was mildly dilated. Impressions: - Compared to the prior study, there has been no significant interval change.  ECG: Atrial fibrillation, rapid ventricular response  ASSESSMENT AND PLAN:  Principal Problem:   Atrial fibrillation with rapid ventricular response - on amiodarone and compliant with medications, including Coumadin. Amiodarone level may be low, we'll draw one. In the meantime, use IV amiodarone load and infusion overnight. If he does not convert spontaneously, will need TEE as his INR was subtherapeutic on 11/20.   Active Problems:   Hypothyroidism - it has been 2 months since his last TSH check will check but it was normal last time.    Essential hypertension - continue his home blood pressure medications    Backache - continue when necessary meds, he had a fall recently and has been on pain control medications for this. X-rays at the time did not show a fracture.    Chronic anticoagulation - INR was subtherapeutic on 11/20, recheck pending. Will write for Coumadin per pharmacy. Patient  states he is compliant with his Coumadin dosing.   Jonetta Speak, PA-C 06/19/2014 4:51 PM Beeper [phone]

## 2014-06-19 NOTE — ED Notes (Signed)
MD at bedside. 

## 2014-06-19 NOTE — Telephone Encounter (Signed)
Spoke with pt, he woke this morning with his pulse 128. It usually runs 53-60. His bp was 129/85 but now is 103/72. He states he is extremely weak and SOB. This is the first time this has happened since his DCCV. He was advised to call EMS for assistance to the hosp. Patient voiced understanding

## 2014-06-19 NOTE — ED Notes (Signed)
Pt eating a [place] sandwich and drinking a sprite talking to his wife at bedside

## 2014-06-19 NOTE — Progress Notes (Signed)
PHARMACIST - PHYSICIAN ORDER COMMUNICATION  CONCERNING: P&T Medication Policy on Herbal Medications  DESCRIPTION:  This patient's order for: Digestive enzymes  has been noted.  This product(s) is classified as an "herbal" or natural product. Due to a lack of definitive safety studies or FDA approval, nonstandard manufacturing practices, plus the potential risk of unknown drug-drug interactions while on inpatient medications, the Pharmacy and Therapeutics Committee does not permit the use of "herbal" or natural products of this type within [place].   ACTION TAKEN: The pharmacy department is unable to verify this order at this time and your patient has been informed of this safety policy. Please reevaluate patient's clinical condition at discharge and address if the herbal or natural product(s) should be resumed at that time.  

## 2014-06-19 NOTE — ED Notes (Signed)
Pt undressed, in gown, on monitor, continuous pulse oximetry and blood pressure cuff; warm blankets given; family at bedside

## 2014-06-20 LAB — AMIODARONE LEVEL
Amiodarone Lvl: 2.4 ug/mL (ref 1.5–2.5)
N-Desethyl-Amiodarone: 0.9 ug/mL — ABNORMAL LOW (ref 1.5–2.5)

## 2014-06-20 LAB — COMPREHENSIVE METABOLIC PANEL
ALBUMIN: 2.9 g/dL — AB (ref 3.5–5.2)
ALT: 13 U/L (ref 0–53)
AST: 17 U/L (ref 0–37)
Alkaline Phosphatase: 68 U/L (ref 39–117)
Anion gap: 12 (ref 5–15)
BUN: 12 mg/dL (ref 6–23)
CALCIUM: 9 mg/dL (ref 8.4–10.5)
CO2: 26 meq/L (ref 19–32)
Chloride: 101 mEq/L (ref 96–112)
Creatinine, Ser: 1.27 mg/dL (ref 0.50–1.35)
GFR calc Af Amer: 58 mL/min — ABNORMAL LOW (ref >90)
GFR, EST NON AFRICAN AMERICAN: 50 mL/min — AB (ref >90)
Glucose, Bld: 98 mg/dL (ref 70–99)
Potassium: 4.2 mEq/L (ref 3.7–5.3)
SODIUM: 139 meq/L (ref 137–147)
TOTAL PROTEIN: 5.9 g/dL — AB (ref 6.0–8.3)
Total Bilirubin: 0.4 mg/dL (ref 0.3–1.2)

## 2014-06-20 LAB — TSH: TSH: 4.64 u[IU]/mL — ABNORMAL HIGH (ref 0.350–4.500)

## 2014-06-20 MED ORDER — WARFARIN SODIUM 5 MG PO TABS
5.0000 mg | ORAL_TABLET | Freq: Once | ORAL | Status: AC
  Administered 2014-06-20: 5 mg via ORAL
  Filled 2014-06-20: qty 1

## 2014-06-20 MED ORDER — METOPROLOL TARTRATE 25 MG PO TABS
25.0000 mg | ORAL_TABLET | Freq: Two times a day (BID) | ORAL | Status: DC
  Administered 2014-06-20 – 2014-06-21 (×3): 25 mg via ORAL
  Filled 2014-06-20 (×4): qty 1

## 2014-06-20 NOTE — Progress Notes (Signed)
ANTICOAGULATION CONSULT NOTE - Initial Consult  Pharmacy Consult for coumadin Indication: atrial fibrillation  No Known Allergies  Patient Measurements: Height: 5\' 11"  (180.3 cm) Weight: 193 lb 6.4 oz (87.726 kg) IBW/kg (Calculated) : 75.3 Heparin Dosing Weight:   Vital Signs: Temp: 98.5 F (36.9 C) (12/05 0338) Temp Source: Oral (12/05 0338) BP: 129/89 mmHg (12/05 0338) Pulse Rate: 105 (12/05 0338)  Labs:  Recent Labs  06/19/14 1305 06/19/14 1747 06/20/14 0514  HGB 12.3*  --   --   HCT 36.5*  --   --   PLT 185  --   --   LABPROT  --  28.7*  --   INR  --  2.68*  --   CREATININE  --   --  1.27    Estimated Creatinine Clearance: 45.3 mL/min (by C-G formula based on Cr of 1.27).    Assessment: 80 yo who presented with increase HR and afib related symptoms. He has been on coumadin. INR on admission is 2.68 which is therapeutic.  Per anticoagulation clinic note of 11/20, home dose is 5mg  on Mon, Thu and Sat and 2.5 mg other days.  INR not drawn today as ordered.  Goal of Therapy:  INR 2-3 Monitor platelets by anticoagulation protocol: Yes   Plan:   Continue warfarin at the same dose as at home.  5mg  x 1 dose today. INR in AM and daily.  Heide Guile, PharmD, BCPS Clinical Pharmacist Pager [phone]

## 2014-06-20 NOTE — Plan of Care (Signed)
Problem: Phase I Progression Outcomes Goal: Ventricular heart rate < 120/min Outcome: Completed/Met Date Met:  06/20/14 Goal: Anticoagulation Therapy per MD order Outcome: Completed/Met Date Met:  06/20/14 Goal: Heart rate or rhythm control medication Outcome: Completed/Met Date Met:  06/20/14 Goal: Pain controlled with appropriate interventions Outcome: Completed/Met Date Met:  06/20/14 Goal: Initial discharge plan identified Outcome: Completed/Met Date Met:  06/20/14 Goal: Hemodynamically stable Outcome: Completed/Met Date Met:  06/20/14

## 2014-06-20 NOTE — Progress Notes (Signed)
Subjective:  Still complains of mild shortness of breath and fatigue, no chest pain.  Objective:  Vital Signs in the last 24 hours: BP 129/89 mmHg  Pulse 105  Temp(Src) 98.5 F (36.9 C) (Oral)  Resp 18  Ht 5\' 11"  (1.803 m)  Wt 87.726 kg (193 lb 6.4 oz)  BMI 26.99 kg/m2  SpO2 91%  Physical Exam: Pleasant male in no acute distress, appearing younger than stated age Lungs:  Clear  Cardiac:  Irregular rhythm, normal S1 and S2, no S3 Abdomen:  Soft, nontender, no masses Extremities:  No edema present  Intake/Output from previous day: 12/04 0701 - 12/05 0700 In: -  Out: 600 [Urine:600] Weight Filed Weights   06/19/14 1854 06/20/14 0338  Weight: 88.1 kg (194 lb 3.6 oz) 87.726 kg (193 lb 6.4 oz)    Lab Results: Basic Metabolic Panel:  Recent Labs  06/20/14 0514  NA 139  K 4.2  CL 101  CO2 26  GLUCOSE 98  BUN 12  CREATININE 1.27    CBC:  Recent Labs  06/19/14 1305  WBC 7.0  HGB 12.3*  HCT 36.5*  MCV 92.9  PLT 185    BNP    Component Value Date/Time   PROBNP 2049.0* 03/20/2014 1111    PROTIME: Lab Results  Component Value Date   INR 2.68* 06/19/2014   INR 1.3 06/05/2014   INR 2.4 04/29/2014   PROTIME 18.7 12/24/2008   PROTIME 21.6 12/17/2008    Telemetry: Atrial fibrillation with somewhat rapid response  Assessment/Plan:  1.  Paroxysmal atrial fibrillation. 2.  Coronary artery disease with no symptoms. 3.  Long-term anticoagulation with subtherapeutic value on November 20 4.  Hypertension.  Recommendations:  Continue IV amiodarone over weekend.  Plan cardioversion with TEE guidance because of subtherapeutic INR less than 3 weeks ago.  Add beta blocker to help with rate control.      Kerry Hough  MD [facility]  06/20/2014, 8:04 AM

## 2014-06-21 LAB — PROTIME-INR
INR: 2.52 — AB (ref 0.00–1.49)
Prothrombin Time: 27.4 seconds — ABNORMAL HIGH (ref 11.6–15.2)

## 2014-06-21 MED ORDER — METOPROLOL TARTRATE 50 MG PO TABS
50.0000 mg | ORAL_TABLET | Freq: Two times a day (BID) | ORAL | Status: DC
  Administered 2014-06-21 – 2014-06-22 (×2): 50 mg via ORAL
  Filled 2014-06-21 (×5): qty 1

## 2014-06-21 MED ORDER — WARFARIN SODIUM 2.5 MG PO TABS
2.5000 mg | ORAL_TABLET | Freq: Once | ORAL | Status: AC
  Administered 2014-06-21: 2.5 mg via ORAL
  Filled 2014-06-21: qty 1

## 2014-06-21 NOTE — Progress Notes (Signed)
Subjective:  Still complains of mild shortness of breath and fatigue, no chest pain.  Objective:  Vital Signs in the last 24 hours: BP 135/93 mmHg  Pulse 110  Temp(Src) 97.1 F (36.2 C) (Oral)  Resp 18  Ht 5\' 11"  (1.803 m)  Wt 87.6 kg (193 lb 2 oz)  BMI 26.95 kg/m2  SpO2 93%  Physical Exam: Pleasant male in no acute distress, appearing younger than stated age Lungs:  Clear  Cardiac:  Irregular rhythm, normal S1 and S2, no S3 Abdomen:  Soft, nontender, no masses Extremities:  No edema present  Intake/Output from previous day: 12/05 0701 - 12/06 0700 In: 480 [P.O.:480] Out: 1575 [Urine:1575] Weight Filed Weights   06/19/14 1854 06/20/14 0338 06/21/14 0514  Weight: 88.1 kg (194 lb 3.6 oz) 87.726 kg (193 lb 6.4 oz) 87.6 kg (193 lb 2 oz)    Lab Results: Basic Metabolic Panel:  Recent Labs  06/20/14 0514  NA 139  K 4.2  CL 101  CO2 26  GLUCOSE 98  BUN 12  CREATININE 1.27    CBC:  Recent Labs  06/19/14 1305  WBC 7.0  HGB 12.3*  HCT 36.5*  MCV 92.9  PLT 185    BNP    Component Value Date/Time   PROBNP 2049.0* 03/20/2014 1111    PROTIME: Lab Results  Component Value Date   INR 2.52* 06/21/2014   INR 2.68* 06/19/2014   INR 1.3 06/05/2014   PROTIME 18.7 12/24/2008   PROTIME 21.6 12/17/2008    Telemetry: Atrial fibrillation with rate around 100 110.  Assessment/Plan:  1.  Paroxysmal atrial fibrillation. 2.  Coronary artery disease with no symptoms. 3.  Long-term anticoagulation with subtherapeutic value on November 20 4.  Hypertension.  Recommendations:  Continue IV amiodarone.  Keep nothing by mouth for possible TEE cardioversion in the morning.  Increase beta blocker to help with rate control.  Risks of cardioversion and TEE discussed with patient today.      Kerry Hough  MD [facility]  06/21/2014, 10:56 AM

## 2014-06-21 NOTE — Progress Notes (Signed)
ANTICOAGULATION CONSULT NOTE - Initial Consult  Pharmacy Consult for coumadin Indication: atrial fibrillation  No Known Allergies  Patient Measurements: Height: 5\' 11"  (180.3 cm) Weight: 193 lb 2 oz (87.6 kg) IBW/kg (Calculated) : 75.3 Heparin Dosing Weight:   Vital Signs: Temp: 97.1 F (36.2 C) (12/06 0514) Temp Source: Oral (12/06 0514) BP: 135/93 mmHg (12/06 0514) Pulse Rate: 110 (12/06 0514)  Labs:  Recent Labs  06/19/14 1305 06/19/14 1747 06/20/14 0514 06/21/14 0503  HGB 12.3*  --   --   --   HCT 36.5*  --   --   --   PLT 185  --   --   --   LABPROT  --  28.7*  --  27.4*  INR  --  2.68*  --  2.52*  CREATININE  --   --  1.27  --     Estimated Creatinine Clearance: 45.3 mL/min (by C-G formula based on Cr of 1.27).    Assessment: 80 yo who presented with increase HR and afib related symptoms. He has been on coumadin. INR on admission is 2.68 which is therapeutic.  Per anticoagulation clinic note of 11/20, home dose is 5mg  on Mon, Thu and Sat and 2.5 mg other days.  INR today is 2.52 which is still therapeutic.  Goal of Therapy:  INR 2-3 Monitor platelets by anticoagulation protocol: Yes   Plan:   Continue warfarin at the same dose as at home.  2.5mg  x 1 dose today. INR in AM and daily.  Heide Guile, PharmD, BCPS Clinical Pharmacist Pager [phone]

## 2014-06-21 NOTE — Plan of Care (Signed)
Problem: Phase II Progression Outcomes Goal: Ventricular heart rate < 100/min Outcome: Completed/Met Date Met:  06/21/14 Goal: Anticoagulation Therapy per MD order Outcome: Completed/Met Date Met:  06/21/14 Goal: Pain controlled Outcome: Completed/Met Date Met:  06/21/14 Goal: Progress activity as tolerated unless otherwise ordered Outcome: Completed/Met Date Met:  06/21/14 Goal: Discharge plan established Outcome: Completed/Met Date Met:  06/21/14 Goal: Tolerating diet Outcome: Completed/Met Date Met:  06/21/14

## 2014-06-21 NOTE — Plan of Care (Signed)
Problem: Phase II Progression Outcomes Goal: CV Risk Factors identified Outcome: Completed/Met Date Met:  06/21/14

## 2014-06-21 NOTE — Plan of Care (Signed)
Problem: Phase III Progression Outcomes Goal: Anticoagulation Therapy per MD order Outcome: Completed/Met Date Met:  06/21/14 Goal: Pain controlled on oral analgesia Outcome: Completed/Met Date Met:  06/21/14 Goal: Activity at appropriate level-compared to baseline (UP IN CHAIR FOR HEMODIALYSIS)  Outcome: Completed/Met Date Met:  06/21/14 Goal: Tolerating diet Outcome: Completed/Met Date Met:  06/21/14

## 2014-06-22 ENCOUNTER — Inpatient Hospital Stay (HOSPITAL_COMMUNITY): Payer: Medicare Other | Admitting: Anesthesiology

## 2014-06-22 ENCOUNTER — Encounter (HOSPITAL_COMMUNITY)
Admission: EM | Disposition: A | Discharge status: Home / Self Care | Payer: Self-pay | Source: Home / Self Care | Attending: Cardiology

## 2014-06-22 ENCOUNTER — Encounter (HOSPITAL_COMMUNITY): Payer: Self-pay | Admitting: Internal Medicine

## 2014-06-22 DIAGNOSIS — I340 Nonrheumatic mitral (valve) insufficiency: Secondary | ICD-10-CM

## 2014-06-22 HISTORY — PX: CARDIOVERSION: SHX1299

## 2014-06-22 HISTORY — PX: TEE WITHOUT CARDIOVERSION: SHX5443

## 2014-06-22 LAB — PROTIME-INR
INR: 2.56 — ABNORMAL HIGH (ref 0.00–1.49)
PROTHROMBIN TIME: 27.7 s — AB (ref 11.6–15.2)

## 2014-06-22 SURGERY — ECHOCARDIOGRAM, TRANSESOPHAGEAL
Anesthesia: General

## 2014-06-22 MED ORDER — LIDOCAINE VISCOUS 2 % MT SOLN
OROMUCOSAL | Status: AC
  Filled 2014-06-22: qty 15

## 2014-06-22 MED ORDER — AMIODARONE HCL 200 MG PO TABS
400.0000 mg | ORAL_TABLET | Freq: Every day | ORAL | Status: DC
  Filled 2014-06-22: qty 2

## 2014-06-22 MED ORDER — SODIUM CHLORIDE 0.9 % IJ SOLN: 3.0000 mL | Freq: Two times a day (BID) | INTRAMUSCULAR | Status: DC

## 2014-06-22 MED ORDER — LACTATED RINGERS IV SOLN
INTRAVENOUS | Status: DC
  Administered 2014-06-22: 1000 mL via INTRAVENOUS

## 2014-06-22 MED ORDER — LIDOCAINE VISCOUS 2 % MT SOLN
OROMUCOSAL | Status: DC | PRN
  Administered 2014-06-22: 1 via OROMUCOSAL

## 2014-06-22 MED ORDER — LACTATED RINGERS IV SOLN
INTRAVENOUS | Status: DC | PRN
  Administered 2014-06-22: 14:00:00 via INTRAVENOUS

## 2014-06-22 MED ORDER — WARFARIN SODIUM 5 MG PO TABS
5.0000 mg | ORAL_TABLET | ORAL | Status: DC
  Administered 2014-06-22: 5 mg via ORAL
  Filled 2014-06-22: qty 1

## 2014-06-22 MED ORDER — WARFARIN SODIUM 2.5 MG PO TABS
2.5000 mg | ORAL_TABLET | ORAL | Status: DC
  Filled 2014-06-22: qty 1

## 2014-06-22 MED ORDER — LIDOCAINE HCL (CARDIAC) 20 MG/ML IV SOLN
INTRAVENOUS | Status: DC | PRN
  Administered 2014-06-22: 40 mg via INTRAVENOUS

## 2014-06-22 MED ORDER — BUTAMBEN-TETRACAINE-BENZOCAINE 2-2-14 % EX AERO
INHALATION_SPRAY | CUTANEOUS | Status: DC | PRN
  Administered 2014-06-22: 2 via TOPICAL

## 2014-06-22 MED ORDER — SODIUM CHLORIDE 0.9 % IV SOLN: 250.0000 mL | INTRAVENOUS | Status: DC

## 2014-06-22 MED ORDER — SODIUM CHLORIDE 0.9 % IJ SOLN: 3.0000 mL | INTRAMUSCULAR | Status: DC | PRN

## 2014-06-22 MED ORDER — PROPOFOL INFUSION 10 MG/ML OPTIME
INTRAVENOUS | Status: DC | PRN
  Administered 2014-06-22: 75 ug/kg/min via INTRAVENOUS

## 2014-06-22 MED ORDER — SODIUM CHLORIDE 0.9 % IV SOLN: INTRAVENOUS | Status: DC

## 2014-06-22 NOTE — CV Procedure (Addendum)
TEE/CARDIOVERSION NOTE  TRANSESOPHAGEAL ECHOCARDIOGRAM (TEE):  Indictation: Atrial Fibrillation  Consent:   Informed consent was obtained prior to the procedure. The risks, benefits and alternatives for the procedure were discussed and the patient comprehended these risks.  Risks include, but are not limited to, cough, sore throat, vomiting, nausea, somnolence, esophageal and stomach trauma or perforation, bleeding, low blood pressure, aspiration, pneumonia, infection, trauma to the teeth and death.    Time Out: Verified patient identification, verified procedure, site/side was marked, verified correct patient position, special equipment/implants available, medications/allergies/relevent history reviewed, required imaging and test results available. Performed  Procedure:  After a procedural time-out, the patient was given Propofol per anesthesia for sedation.  The oropharynx was anesthetized 10 cc of topical 1% viscous lidocaine and 2 cetacaine sprays.  The transesophageal probe was inserted in the esophagus with a small amount of difficulty, but eventually easily passed and multiple views were obtained.  The patient was kept under observation until the patient left the procedure room.  The patient left the procedure room in stable condition.   Agitated microbubble saline contrast was not administered.  Complications:    Complications: None Patient did tolerate procedure well.  Findings:  1. LEFT VENTRICLE: The left ventricular wall thickness is mildly increased.  The left ventricular cavity is normal in size. Wall motion is normal.  LVEF is 55%.  2. RIGHT VENTRICLE:  The right ventricle is normal in structure and function without any thrombus or masses.    3. LEFT ATRIUM:  The left atrium is dilated in size without any thrombus or masses.  There is not spontaneous echo contrast ("smoke") in the left atrium consistent with a low flow state.  4. LEFT ATRIAL APPENDAGE:  The left  atrial appendage is free of any thrombus or masses. The appendage has single lobes. Pulse doppler indicates moderate flow in the appendage.  5. ATRIAL SEPTUM:  The atrial septum appears intact and is free of thrombus and/or masses.  There is no evidence for interatrial shunting by color doppler and saline microbubble.  6. RIGHT ATRIUM:  The right atrium is normal in size and function without any thrombus or masses.  7. MITRAL VALVE:  The mitral valve is normal in structure and function with Moderate regurgitation.  There were no vegetations or stenosis.  8. AORTIC VALVE:  The aortic valve is trileaflet, normal in structure and function with trivial regurgitation.  There were no vegetations or stenosis  9. TRICUSPID VALVE:  The tricuspid valve is normal in structure and function with Mild regurgitation.  There were no vegetations or stenosis  10.  PULMONIC VALVE:  The pulmonic valve is normal in structure and function with no regurgitation.  There were no vegetations or stenosis.   11. AORTIC ARCH, ASCENDING AND DESCENDING AORTA:  There was grade 1 Ron Parker et. Al, 1992) atherosclerosis of the proximal descending aorta.  12. PULMONARY VEINS: Anomalous pulmonary venous return was not noted.  13. PERICARDIUM: The pericardium appeared normal and non-thickened.  There is no pericardial effusion.  CARDIOVERSION:     Second Time Out: Verified patient identification, verified procedure, site/side was marked, verified correct patient position, special equipment/implants available, medications/allergies/relevent history reviewed, required imaging and test results available.  Performed  Procedure:  1. Patient placed on cardiac monitor, pulse oximetry, supplemental oxygen as necessary.  2. Sedation administered per anesthesia 3. Pacer pads placed anterior and posterior chest. 4. Cardioverted 1 time(s).  5. Cardioverted at 150J biphasic.  Complications:  Complications: None Patient  did tolerate  procedure well.  Impression:  1. No LAA thrombus 2. LVEF 55-60% 3. Moderate MR, trivial AI 4. Successful DCCV to NSR with a single 150J biphasic shock.  Recommendations:  1. Continue po amiodarone at higher dose.  2. Likely discharge home later today or tomorrow.  Time Spent Directly with the Patient:  30 minutes   [name], MD, [facility] Attending Cardiologist [facility]  06/22/2014, 2:43 PM

## 2014-06-22 NOTE — Progress Notes (Signed)
    Subjective:  Fatigued. Had shortness of breath last night but better now. No CP.   Objective:  Vital Signs in the last 24 hours: Temp:  [97.5 F (36.4 C)-98.4 F (36.9 C)] 97.5 F (36.4 C) (12/07 0500) Pulse Rate:  [94-96] 96 (12/07 0500) Resp:  [18-19] 18 (12/07 0500) BP: (105-142)/(77-94) 105/77 mmHg (12/07 0500) SpO2:  [96 %-97 %] 97 % (12/07 0500) Weight:  [193 lb 12.6 oz (87.9 kg)] 193 lb 12.6 oz (87.9 kg) (12/07 0700)  Intake/Output from previous day: 12/06 0701 - 12/07 0700 In: 560.4 [P.O.:240; I.V.:320.4] Out: -   Physical Exam: Pt is alert and oriented, elderly man in NAD HEENT: normal Neck: JVP - normal Lungs: CTA bilaterally CV: irregular without murmur or gallop Abd: soft, NT, Positive BS, no hepatomegaly Ext: no C/C/E, distal pulses intact and equal Skin: warm/dry no rash   Lab Results:  Recent Labs  06/19/14 1305  WBC 7.0  HGB 12.3*  PLT 185    Recent Labs  06/20/14 0514  NA 139  K 4.2  CL 101  CO2 26  GLUCOSE 98  BUN 12  CREATININE 1.27   No results for input(s): TROPONINI in the last 72 hours.  Invalid input(s): CK, MB  Tele: Atrial fibrillation, heart rate 80 bpm  Assessment/Plan:  1. Recurrent AF, paroxysmal, symptomatic 2. CAD, no symptoms 3. Chronic anticoagulation but INR 1.3 11/20, otherwise has been therapeutic 4. HTN, essential  TEE/Cardioversion. Heart rate better on IV amiodarone. He is failing outpatient therapy with amiodarone with recurrent episodes of atrial fibrillation. Unfortunately he is quite symptomatic when in AF. May consider increasing his amiodarone to 200 mg BID as I don't know of other good options for him. Amiodarone level was within therapeutic limits.  I have reviewed risks/indications of TEE/cardioversion and he understands. Family (wife and son) at bedside.   Sherren Mocha, M.D. 06/22/2014, 11:11 AM

## 2014-06-22 NOTE — Transfer of Care (Signed)
Immediate Anesthesia Transfer of Care Note  Patient: [name]  Procedure(s) Performed: Procedure(s): TRANSESOPHAGEAL ECHOCARDIOGRAM (TEE) (N/A) CARDIOVERSION (N/A)  Patient Location: Endoscopy Unit  Anesthesia Type:MAC  Level of Consciousness: awake, alert  and oriented  Airway & Oxygen Therapy: Patient Spontanous Breathing and Patient connected to nasal cannula oxygen  Post-op Assessment: Report given to PACU RN and Post -op Vital signs reviewed and stable  Post vital signs: Reviewed and stable  Complications: No apparent anesthesia complications

## 2014-06-22 NOTE — Progress Notes (Signed)
  Echocardiogram Echocardiogram Transesophageal has been performed.  [name] M 06/22/2014, 3:10 PM

## 2014-06-22 NOTE — H&P (Signed)
     INTERVAL PROCEDURE H&P  History and Physical Interval Note:  06/22/2014 1:39 PM  [name] has presented today for their planned procedure. The various methods of treatment have been discussed with the patient and family. After consideration of risks, benefits and other options for treatment, the patient has consented to the procedure.  The patients' outpatient history has been reviewed, patient examined, and no change in status from most recent office note within the past 30 days. I have reviewed the patients' chart and labs and will proceed as planned. Questions were answered to the patient's satisfaction.   [name], MD, [facility] Attending Cardiologist [facility]  [name] 06/22/2014, 1:39 PM

## 2014-06-22 NOTE — Anesthesia Postprocedure Evaluation (Signed)
  Anesthesia Post-op Note  Patient: [name]  Procedure(s) Performed: Procedure(s): TRANSESOPHAGEAL ECHOCARDIOGRAM (TEE) (N/A) CARDIOVERSION (N/A)  Patient Location: PACU and Endoscopy Unit  Anesthesia Type:MAC  Level of Consciousness: awake, alert  and oriented  Airway and Oxygen Therapy: Patient Spontanous Breathing and Patient connected to nasal cannula oxygen  Post-op Pain: none  Post-op Assessment: Post-op Vital signs reviewed, Patient's Cardiovascular Status Stable, Respiratory Function Stable, Patent Airway and No signs of Nausea or vomiting  Post-op Vital Signs: Reviewed and stable  Last Vitals:  Filed Vitals:   06/22/14 1309  BP: 142/90  Pulse:   Temp: 36.6 C  Resp: 13    Complications: No apparent anesthesia complications

## 2014-06-22 NOTE — Plan of Care (Signed)
Problem: Phase III Progression Outcomes Goal: Sinus rhythm established or heart rate < 100 at rest Outcome: Completed/Met Date Met:  06/22/14

## 2014-06-22 NOTE — Progress Notes (Signed)
ANTICOAGULATION CONSULT NOTE - Follow Up Consult  Pharmacy Consult for Coumadin Indication: atrial fibrillation  No Known Allergies  Patient Measurements: Height: 5\' 11"  (180.3 cm) Weight: 193 lb 12.6 oz (87.9 kg) IBW/kg (Calculated) : 75.3 Heparin Dosing Weight:   Vital Signs: Temp: 97.9 F (36.6 C) (12/07 1309) Temp Source: Oral (12/07 0500) BP: 142/90 mmHg (12/07 1309) Pulse Rate: 96 (12/07 0500)  Labs:  Recent Labs  06/19/14 1747 06/20/14 0514 06/21/14 0503 06/22/14 0332  LABPROT 28.7*  --  27.4* 27.7*  INR 2.68*  --  2.52* 2.56*  CREATININE  --  1.27  --   --     Estimated Creatinine Clearance: 45.3 mL/min (by C-G formula based on Cr of 1.27).  Assessment: 87yom continues on Coumadin for Afib. INR (2.56) remains therapeutic - will continue PTA Coumadin regimen. Noted plans for TEE/DCCV today. - No new CBC  - No significant bleeding reported - PTA Coumadin regimen: 2.5mg  daily except 5mg  M,Th,Sat  Goal of Therapy:  INR 2-3   Plan:  1. Continue PTA Coumadin regimen: 5mg  due today 2. Daily INR  Earleen Newport  [phone] 06/22/2014,2:19 PM

## 2014-06-22 NOTE — Anesthesia Preprocedure Evaluation (Signed)
Anesthesia Evaluation  Patient identified by MRN, date of birth, ID band Patient awake    Reviewed: Allergy & Precautions, H&P , NPO status , Patient's Chart, lab work & pertinent test results, reviewed documented beta blocker date and time   Airway        Dental   Pulmonary sleep apnea ,          Cardiovascular hypertension, Pt. on medications + dysrhythmias Atrial Fibrillation  EF 55% 03/2014   Neuro/Psych Depression    GI/Hepatic GERD-  Medicated,  Endo/Other  Hypothyroidism   Renal/GU      Musculoskeletal   Abdominal   Peds  Hematology   Anesthesia Other Findings   Reproductive/Obstetrics                             Anesthesia Physical Anesthesia Plan  ASA: III  Anesthesia Plan: General   Post-op Pain Management:    Induction: Intravenous  Airway Management Planned: Nasal Cannula  Additional Equipment:   Intra-op Plan:   Post-operative Plan:   Informed Consent: I have reviewed the patients History and Physical, chart, labs and discussed the procedure including the risks, benefits and alternatives for the proposed anesthesia with the patient or authorized representative who has indicated his/her understanding and acceptance.     Plan Discussed with:   Anesthesia Plan Comments:         Anesthesia Quick Evaluation

## 2014-06-22 NOTE — Progress Notes (Signed)
Medicare Important Message given? YES  Date Medicare IM given:  06/22/2014 Medicare IM given by: Lenox Ponds RN CCM Case Mgmt phone [phone]

## 2014-06-23 ENCOUNTER — Telehealth: Payer: Self-pay | Admitting: Cardiology

## 2014-06-23 ENCOUNTER — Encounter (HOSPITAL_COMMUNITY): Payer: Self-pay | Admitting: Internal Medicine

## 2014-06-23 LAB — PROTIME-INR
INR: 2.66 — ABNORMAL HIGH (ref 0.00–1.49)
PROTHROMBIN TIME: 28.6 s — AB (ref 11.6–15.2)

## 2014-06-23 LAB — T4, FREE: FREE T4: 1.42 ng/dL (ref 0.80–1.80)

## 2014-06-23 LAB — TSH: TSH: 5.16 u[IU]/mL — ABNORMAL HIGH (ref 0.350–4.500)

## 2014-06-23 LAB — T3: T3 TOTAL: 52.6 ng/dL — AB (ref 80.0–204.0)

## 2014-06-23 MED ORDER — METOPROLOL TARTRATE 25 MG PO TABS: 12.5000 mg | ORAL_TABLET | Freq: Two times a day (BID) | ORAL | Status: DC

## 2014-06-23 MED ORDER — AMIODARONE HCL 200 MG PO TABS: 200.0000 mg | ORAL_TABLET | Freq: Two times a day (BID) | ORAL | Status: DC

## 2014-06-23 MED ORDER — METOPROLOL TARTRATE 12.5 MG HALF TABLET
12.5000 mg | ORAL_TABLET | Freq: Two times a day (BID) | ORAL | Status: DC
  Administered 2014-06-23: 12.5 mg via ORAL
  Filled 2014-06-23 (×2): qty 1

## 2014-06-23 MED ORDER — AMIODARONE HCL 200 MG PO TABS
200.0000 mg | ORAL_TABLET | Freq: Two times a day (BID) | ORAL | Status: DC
  Administered 2014-06-23: 200 mg via ORAL
  Filled 2014-06-23 (×2): qty 1

## 2014-06-23 NOTE — Progress Notes (Signed)
Patient: [name] / Admit Date: 06/19/2014 / Date of Encounter: 06/23/2014, 8:03 AM   Subjective: Feels excellent. No CP or SOB.   Objective: Telemetry: sinus bradycardia upper 40s low 50s  Physical Exam: Blood pressure 113/83, pulse 50, temperature 98.2 F (36.8 C), temperature source Oral, resp. rate 17, height 5\' 11"  (1.803 m), weight 193 lb 6.4 oz (87.726 kg), SpO2 93 %. General: Well developed, well nourished WM, in no acute distress. Head: Normocephalic, atraumatic, sclera non-icteric, no xanthomas, nares are without discharge. Neck: Negative for carotid bruits. JVP not elevated. Lungs: Clear bilaterally to auscultation without wheezes, rales, or rhonchi. Breathing is unlabored. Heart: Reg rhythm slightly bradycardic S1 S2 without murmurs, rubs, or gallops.  Abdomen: Soft, non-tender, non-distended with normoactive bowel sounds. No rebound/guarding. Extremities: No clubbing or cyanosis. No edema. Distal pedal pulses are 2+ and equal bilaterally. Neuro: Alert and oriented X 3. Moves all extremities spontaneously. Psych:  Responds to questions appropriately with a normal affect.   Intake/Output Summary (Last 24 hours) at 06/23/14 0803 Last data filed at 06/22/14 1700  Gross per 24 hour  Intake    740 ml  Output      0 ml  Net    740 ml    Inpatient Medications:  . amiodarone  400 mg Oral Daily  . clonazePAM  1 mg Oral QHS  . ferrous sulfate  325 mg Oral BID WC  . levothyroxine  25 mcg Oral QAC breakfast  . metoprolol tartrate  50 mg Oral BID  . multivitamin with minerals  1 tablet Oral Daily  . pantoprazole  40 mg Oral BID  . pravastatin  40 mg Oral QPM  . sodium chloride  3 mL Intravenous Q12H  . sodium chloride  3 mL Intravenous Q12H  . warfarin  2.5 mg Oral Once per day on Sun Tue Wed Fri  . warfarin  5 mg Oral Once per day on Mon Thu Sat  . Warfarin - Pharmacist Dosing Inpatient   Does not apply q1800   Infusions:  . sodium chloride      Labs: No results  for input(s): NA, K, CL, CO2, GLUCOSE, BUN, CREATININE, CALCIUM, MG, PHOS in the last 72 hours. No results for input(s): AST, ALT, ALKPHOS, BILITOT, PROT, ALBUMIN in the last 72 hours. No results for input(s): WBC, NEUTROABS, HGB, HCT, MCV, PLT in the last 72 hours. No results for input(s): CKTOTAL, CKMB, TROPONINI in the last 72 hours. Invalid input(s): POCBNP No results for input(s): HGBA1C in the last 72 hours.   Radiology/Studies:  Dg Chest 2 View  06/19/2014   CLINICAL DATA:  Initial evaluation for shortness of breath for 1 week, fell 1 week ago and shortness of breath since thin due to irregular heart rate, also a right-sided back pain, also dry cough  EXAM: CHEST  2 VIEW  COMPARISON:  03/20/2014  FINDINGS: Stable moderate cardiac enlargement. Mild vascular congestion. Mild diffuse interstitial prominence. Tiny right effusion.  IMPRESSION: Findings suggest congestive heart failure with mild interstitial pulmonary edema   Electronically Signed   By: Skipper Cliche M.D.   On: 06/19/2014 15:33   TEE 06/22/14: EF 55-60%, mod MR, trivial AI   Assessment and Plan  1. Recurrent PAF 2. CAD s/p remote PCI 3. Hypothyroidism - TSH mildly elevated 4. HTN 5. Sinus bradycardia  Feeling great with restoration of NSR. Per d/w Dr. Burt Knack, will decrease metoprolol to 12.5mg  BID and change amiodarone to 200mg  BID - further dosing to be determined  in close office f/u within 7-10 days. Will send off repeat thyroid function this AM given amio use/abnormal TSH and I will f/u on the level. Pt requests I print his rx's to take to the [place]. Keep Coumadin clinic f/u as scheduled.  Signed, Melina Copa PA-C  Patient seen, examined. Available data reviewed. Agree with findings, assessment, and plan as outlined by Melina Copa, PA-C. The patient is feeling dramatically better this morning. Telemetry reveals sinus bradycardia with a heart rate of 50 bpm. His exam shows an elderly gentleman in no distress. He is standing  up at the sink. Lungs are clear. Heart is bradycardic and regular without murmur. There is no peripheral edema. Medication changes as outlined above. The patient will have close outpatient follow-up with Dr. Percival Spanish. We will follow-up on his thyroid studies when they are available.  Sherren Mocha, M.D. 06/23/2014 8:36 AM

## 2014-06-23 NOTE — Telephone Encounter (Signed)
Closed encounter °

## 2014-06-23 NOTE — Progress Notes (Signed)
06/23/2014 9:08 AM Nursing note Verbal order received ok to administer scheduled metoprolol and amiodarone as scheduled by Melina Copa PAC due to HR 51 BP 106/58.  Ajamu Maxon, Arville Lime

## 2014-06-23 NOTE — Progress Notes (Signed)
06/23/2014 10:15 AM Nursing note Discharge avs form, medications already taken today and those due this evening given and explained to patient and family member. rx given and reviewed. Follow up appointments and INR check reviewed along with when to call MD.  Iv d/c. Tele d/c. Questions and concerns addressed. D/c home per orders.  Charolette Bultman, Arville Lime

## 2014-06-23 NOTE — Discharge Summary (Signed)
Discharge Summary   Patient ID: [name] MRN: [id number], DOB/AGE: [id number] 78 y.o. Admit date: 06/19/2014 D/C date:     06/23/2014  Primary Care Provider: Nyoka Cowden, MD Primary Cardiologist: Hochrein  Primary Discharge Diagnoses:  1. Recurrent PAF - s/p TEE/DCCV, increase in amiodarone dose 2. Sinus bradycardia 3. CAD s/p remote PCI (stent to OM 2002, BMS 2006 to RCA, cath 2007) 4. Hypothyroidism - TSH mildly elevated 5. HTN 6. Mod MR by TEE 06/2014  Secondary Discharge Diagnoses:  . HYPERLIPIDEMIA 10/20/2008  . DEPRESSION 12/17/2007  . GERD 04/10/2007  . DIVERTICULOSIS, COLON 04/10/2007  . BENIGN PROSTATIC HYPERTROPHY 04/10/2007  . GYNECOMASTIA, UNILATERAL 03/23/2010  . SLEEP APNEA 05/20/2007  . Hematoma of abdominal wall   . Mallory - Weiss tear     > 5 years ago  . Barrett's esophagus   . Popliteal artery embolism, right   . History of echocardiogram     a. EF 55% (TEE 08/2011)  . Chronic anticoagulation     Coumadin    Hospital Course: [name] is an 80 y/o M with history of CAD s/p remote PCI as above, PAF on coumadin, hypothyroidism, HTN, HLD, MR who presented to [facility] 06/19/14 with complaints of elevated heart rate and near syncope similar to prior episodes of atrial fibrillation. He has otherwise been in his usual state of health except for a fall several days prior in which he struck his back. Per H/P, X-rays at the time did not show any fracture. He had had some back pain but this was gradually improving. On the day of admission he checked his vitals and HR was 126, BP was OK. While in the shower he had a presyncopal episode along with some DOE. His heart rate runs in the 50s at times (asymptomatic when doing so) so this was unusual for him. Symptoms reminded him of prior episodes of AF so he came to the ED where this was confirmed with rapid ventricular response. CXR: congestive heart failure with mild interstitial pulmonary edema. It was felt  that perhaps his amiodarone level was a little low so he was loaded with additional IV amiodarone. Consideration was given to IV Lasix per H/P but this was not ordered. Although diltiazem was on the patient's medication list, pharmacy indicated the patient was not taking this so it was not ordered on admission. Beta blocker was added to aid with additional heart rate control. He was observed on IV amiodarone over the weekend and on Monday 06/22/14 underwent TEE/DCCV to NSR. (TEE for INR of 1.3 in November which was therapeutic during this admission.) TEE: no LAA thrombus, EF 55-60%, moderate MR, trivial AI. Once converted to NSR, the patient felt markedly better. Clinically he has no evidence of CHF on exam today.  Overnight he was noted to be bradycardic in the upper 40s and HR is low 50's this AM. As such, we will reduce hospital dose of metoprolol from 50mg  BID to 12.5mg  BID upon discharge. (He was not previously on a beta blocker.) We will also plan to send him on out on amiodarone 200mg  BID with f/u in the office to determine if we can drop this dose back down. Of note, labs this admission revealed TSH 4.640. We have ordered TSH, free T4 and T3 level to be drawn prior to dc but he will not necessarily stay for these results. Dr. Burt Knack has seen and examined the patient today and feels he is stable for discharge.  I have sent a message to our [facility] office's scheduler requesting a follow-up appointment, and our office will call the patient with this information.   Discharge Vitals: Blood pressure 113/83, pulse 50, temperature 98.2 F (36.8 C), temperature source Oral, resp. rate 17, height 5\' 11"  (1.803 m), weight 193 lb 6.4 oz (87.726 kg), SpO2 93 %.  Labs: Lab Results  Component Value Date   WBC 7.0 06/19/2014   HGB 12.3* 06/19/2014   HCT 36.5* 06/19/2014   MCV 92.9 06/19/2014   PLT 185 06/19/2014    Recent Labs Lab 06/20/14 0514  NA 139  K 4.2  CL 101  CO2 26  BUN 12  CREATININE  1.27  CALCIUM 9.0  PROT 5.9*  BILITOT 0.4  ALKPHOS 68  ALT 13  AST 17  GLUCOSE 98    Lab Results  Component Value Date   CHOL 130 10/07/2008   HDL 61.10 10/07/2008   LDLCALC 63 10/07/2008   TRIG 32.0 10/07/2008    Diagnostic Studies/Procedures   Dg Chest 2 View  06/19/2014   CLINICAL DATA:  Initial evaluation for shortness of breath for 1 week, fell 1 week ago and shortness of breath since thin due to irregular heart rate, also a right-sided back pain, also dry cough  EXAM: CHEST  2 VIEW  COMPARISON:  03/20/2014  FINDINGS: Stable moderate cardiac enlargement. Mild vascular congestion. Mild diffuse interstitial prominence. Tiny right effusion.  IMPRESSION: Findings suggest congestive heart failure with mild interstitial pulmonary edema   Electronically Signed   By: Skipper Cliche M.D.   On: 06/19/2014 15:33   TEE/DCCV, see report  Discharge Medications   Current Discharge Medication List    START taking these medications   Details  metoprolol tartrate (LOPRESSOR) 25 MG tablet Take 0.5 tablets (12.5 mg total) by mouth 2 (two) times daily. Qty: 60 tablet, Refills: 2      CONTINUE these medications which have CHANGED   Details  amiodarone (PACERONE) 200 MG tablet Take 1 tablet (200 mg total) by mouth 2 (two) times daily. Qty: 60 tablet, Refills: 2      CONTINUE these medications which have NOT CHANGED   Details  clonazePAM (KLONOPIN) 1 MG tablet Take 1 mg by mouth at bedtime.      DIGESTIVE ENZYMES PO Take 1 tablet by mouth 2 (two) times daily.     ferrous sulfate 325 (65 FE) MG tablet Take 325 mg by mouth 2 (two) times daily with a meal.    fish oil-omega-3 fatty acids 1000 MG capsule Take 1 g by mouth 2 (two) times daily.     HYDROcodone-acetaminophen (NORCO/VICODIN) 5-325 MG per tablet Take 1 tablet by mouth every 6 (six) hours as needed for moderate pain.     hydroxypropyl methylcellulose (ISOPTO TEARS) 2.5 % ophthalmic solution Place 1 drop into both eyes 3  (three) times daily as needed for dry eyes.     levothyroxine (SYNTHROID, LEVOTHROID) 25 MCG tablet Take 25 mcg by mouth daily.     Multiple Vitamin (MULTIVITAMIN) capsule Take 1 capsule by mouth daily.    nitroGLYCERIN (NITROSTAT) 0.4 MG SL tablet Place 0.4 mg under the tongue every 5 (five) minutes as needed for chest pain.     pantoprazole (PROTONIX) 40 MG tablet Take 40 mg by mouth 2 (two) times daily.     pravastatin (PRAVACHOL) 40 MG tablet Take 40 mg by mouth every evening.    traZODone (DESYREL) 50 MG tablet Take 50-100 mg by mouth at bedtime  as needed for sleep.    warfarin (COUMADIN) 5 MG tablet Take 2.5-5 mg by mouth daily. 1/2 tablet everyday (2.5mg ) except 1 tablet (5mg ) on Mondays, Thursdays and Saturdays      STOP taking these medications     diltiazem (CARDIZEM CD) 120 MG 24 hr capsule - not taking PTA         Disposition   The patient will be discharged in stable condition to home. Discharge Instructions    Diet - low sodium heart healthy    Complete by:  As directed      Increase activity slowly    Complete by:  As directed           Follow-up Information    Follow up with Minus Breeding, MD.   Specialty:  Cardiology   Why:  Office will call you for your followup appointment. Call office if you have not heard back in 3 days.   Contact information:   [address] [place] [address] [phone]       Follow up with Labs in the Hospital.   Why:  We will send off repeat thyroid function before you leave the hospital. If the levels need adjusting, we will let you know.      Follow up with [facility].   Why:  06/26/14 at 9:40am as previously scheduled        Duration of Discharge Encounter: Greater than 30 minutes including physician and PA time.  Signed, Melina Copa PA-C 06/23/2014, 8:51 AM

## 2014-06-24 ENCOUNTER — Telehealth: Payer: Self-pay | Admitting: Physician Assistant

## 2014-06-24 NOTE — Telephone Encounter (Signed)
Spoke to Mr. Goon this afternoon. He reports being tired however, he feels better.  I confirmed his appointments with them.  He did not have any questions regarding medications.  He did not have any questions regarding his medications  Calden Dorsey PAC

## 2014-06-25 ENCOUNTER — Telehealth: Payer: Self-pay | Admitting: Physician Assistant

## 2014-06-25 NOTE — Telephone Encounter (Signed)
This patient was discharged on 12/8, admitted for recurrent PAF. His TSH was abnormal so I had them drawn repeat labs before he left so they were pending when he was discharged. TSH was still abnormal at 5.1, free T4 was normal but T3 was low.   Arbie Cookey, please call the patient to inform him of these labs. He needs to touch base with his PCP (or whoever manages his hypothyroidism) to determine if they want him to go up on his levothyroxine. Thanks.  Cora Brierley PA-C

## 2014-06-26 ENCOUNTER — Encounter: Payer: Self-pay | Admitting: Cardiology

## 2014-06-26 ENCOUNTER — Ambulatory Visit (INDEPENDENT_AMBULATORY_CARE_PROVIDER_SITE_OTHER): Payer: Medicare Other | Admitting: Cardiology

## 2014-06-26 ENCOUNTER — Telehealth: Payer: Self-pay | Admitting: Cardiology

## 2014-06-26 ENCOUNTER — Telehealth: Payer: Self-pay | Admitting: Internal Medicine

## 2014-06-26 ENCOUNTER — Ambulatory Visit (INDEPENDENT_AMBULATORY_CARE_PROVIDER_SITE_OTHER): Payer: Medicare Other | Admitting: Pharmacist Clinician (PhC)/ Clinical Pharmacy Specialist

## 2014-06-26 VITALS — BP 160/90 | HR 65 | Ht 71.0 in | Wt 195.0 lb

## 2014-06-26 DIAGNOSIS — I2510 Atherosclerotic heart disease of native coronary artery without angina pectoris: Secondary | ICD-10-CM

## 2014-06-26 DIAGNOSIS — Z5181 Encounter for therapeutic drug level monitoring: Secondary | ICD-10-CM

## 2014-06-26 DIAGNOSIS — I482 Chronic atrial fibrillation, unspecified: Secondary | ICD-10-CM

## 2014-06-26 DIAGNOSIS — R5383 Other fatigue: Secondary | ICD-10-CM | POA: Insufficient documentation

## 2014-06-26 DIAGNOSIS — E038 Other specified hypothyroidism: Secondary | ICD-10-CM

## 2014-06-26 DIAGNOSIS — I480 Paroxysmal atrial fibrillation: Secondary | ICD-10-CM

## 2014-06-26 DIAGNOSIS — Z7901 Long term (current) use of anticoagulants: Secondary | ICD-10-CM

## 2014-06-26 DIAGNOSIS — I4891 Unspecified atrial fibrillation: Secondary | ICD-10-CM

## 2014-06-26 LAB — POCT INR: INR: 3.6

## 2014-06-26 MED ORDER — AMIODARONE HCL 200 MG PO TABS: 200.0000 mg | ORAL_TABLET | ORAL | Status: DC

## 2014-06-26 MED ORDER — METOPROLOL TARTRATE 25 MG PO TABS: 12.5000 mg | ORAL_TABLET | Freq: Every day | ORAL | Status: DC

## 2014-06-26 NOTE — Telephone Encounter (Signed)
Pt received a call from cardiologist to see dr Raliegh Ip for follow up on meds. Pt also needs [facility] follow up from [facility]. Pt was discharge on 06-23-14. Butch Penny where can I create 30 min slot?

## 2014-06-26 NOTE — Assessment & Plan Note (Signed)
TSH 5- free T4 WNL

## 2014-06-26 NOTE — Telephone Encounter (Signed)
[name], please schedule pt to see another provider next week due to discussing medications, should not wait till Dr. Raliegh Ip is back and  there are no openings.

## 2014-06-26 NOTE — Patient Instructions (Signed)
Your physician has recommended you make the following change in your medication:  1) DECREASE Amiodarone to 200mg  each morning 2) DECREASE Metoprolol to 12.5mg  each night  Take all other medications as prescribed  Your physician recommends that you schedule a follow-up appointment in: 4-6 weeks with Dr.Hochrein

## 2014-06-26 NOTE — Telephone Encounter (Signed)
Pt's wife called in stating the a doctor from [place] put the pt back on Metoprolol which does not mix well with the Amiodarone. He is feeling very weak. Please call and advise  Thanks

## 2014-06-26 NOTE — Assessment & Plan Note (Signed)
S/P TEE CV 06/22/14- Amiodarone re loaded and dose increased to 200 mg BID

## 2014-06-26 NOTE — Assessment & Plan Note (Signed)
Coumadin clinic  

## 2014-06-26 NOTE — Assessment & Plan Note (Signed)
Pt feels this is from Metoprolol

## 2014-06-26 NOTE — Telephone Encounter (Addendum)
The last message was sent to me in "review reports" so I could only reply directly to the sender. I sent [name] this message:    Sent: 06/26/2014 11:23 AM      To: [name], CMA    Subject: Weakness                           This message was sent as a "Review Reports" so I can't find a button that actually replies to you in the chart. Please send me a msg back to let me know you received this.    Please get information on what blood pressure and HR are running. If we don't have this info, it's difficult to know if he's weak from the meds, because he's back in AF or because he's bradycardic. If he has no way to obtain this information, he should be seen in clinic today or by urgent care. Thanks!    Dayna    She sent me this message back:  Hi Dayna,         So pt did not know what he had done with his readings, but he did take while I was on the phone with him, BP 148/88 HR 66. I advised pt that you stated to either come in today to be seen in our office or go to Urgent Care. Pt wants to be seen here, so I had pt added onto Flex schedule at 2:30 with Kerin Ransom dx Weakness and possibly back in A-fib.        Thank you    Arbie Cookey     I forwarded these messages to Kerin Ransom PA-C who will be seeing the patient today. Dayna Dunn PA-C

## 2014-06-26 NOTE — Assessment & Plan Note (Signed)
PCI '02, '06, and '07

## 2014-06-26 NOTE — Telephone Encounter (Signed)
Pt notified about lab results per Melina Copa, PA. Pt c/o weakness since d/c on Metoprolol 12.5 mg bid, while taking along w/Amiodarone. Pt states he has taken both of these before together and had the reaction. I advised to go to ED if feeling worse.

## 2014-06-26 NOTE — Progress Notes (Signed)
06/26/2014 [name]   October 22, 1926  [id number]  Primary Physician Nyoka Cowden, MD Primary Cardiologist: Dr Percival Spanish  HPI:  80 y/o M with history of CAD s/p remote PCI as above, PAF on coumadin, hypothyroidism, HTN, HLD, MR who presented to [facility] 06/19/14 with complaints of elevated heart rate and near syncope similar to prior episodes of atrial fibrillation. He says he had had "7 cardioversions". The last one in Sept. He has been on Amiodarone "for years". He underwent TEE CV (INR had been low once in previous 4 weeks). He was given IV Amiodarone and his dose was increased to 200 mg BID. Metoprolol was added but cut back at discharge secondary to bradycardia. The pt is seen in the office today as an add on with complaints of fatigue last 24 hrs. He feels its from the addition of Metoprolol. He thinks he has been on this previously with similar intolerance.    Current Outpatient Prescriptions  Medication Sig Dispense Refill  . amiodarone (PACERONE) 200 MG tablet Take 1 tablet (200 mg total) by mouth every morning.    . clonazePAM (KLONOPIN) 1 MG tablet Take 1 mg by mouth at bedtime.      [name] DIGESTIVE ENZYMES PO Take 1 tablet by mouth 2 (two) times daily.     . ferrous sulfate 325 (65 FE) MG tablet Take 325 mg by mouth 2 (two) times daily with a meal.    . fish oil-omega-3 fatty acids 1000 MG capsule Take 1 g by mouth 2 (two) times daily.     [name] HYDROcodone-acetaminophen (NORCO/VICODIN) 5-325 MG per tablet Take 1 tablet by mouth every 6 (six) hours as needed for moderate pain. 30 tablet 0  . hydroxypropyl methylcellulose (ISOPTO TEARS) 2.5 % ophthalmic solution Place 1 drop into both eyes 3 (three) times daily as needed for dry eyes.     [name] levothyroxine (SYNTHROID, LEVOTHROID) 25 MCG tablet Take 25 mcg by mouth daily.     . metoprolol tartrate (LOPRESSOR) 25 MG tablet Take 0.5 tablets (12.5 mg total) by mouth at bedtime.    . Multiple Vitamin (MULTIVITAMIN) capsule  Take 1 capsule by mouth daily.    . nitroGLYCERIN (NITROSTAT) 0.4 MG SL tablet Place 0.4 mg under the tongue every 5 (five) minutes as needed for chest pain.     . pantoprazole (PROTONIX) 40 MG tablet Take 40 mg by mouth 2 (two) times daily.     . pravastatin (PRAVACHOL) 40 MG tablet Take 40 mg by mouth every evening.    . traZODone (DESYREL) 50 MG tablet Take 50-100 mg by mouth at bedtime as needed for sleep.    [name] warfarin (COUMADIN) 5 MG tablet Take 2.5-5 mg by mouth daily. 1/2 tablet everyday (2.5mg ) except 1 tablet (5mg ) on Mondays, Thursdays and Saturdays     No current facility-administered medications for this visit.    No Known Allergies  History   Social History  . Marital Status: Married    Spouse Name: N/A    Number of Children: 1  . Years of Education: N/A   Occupational History  . retired    Social History Main Topics  . Smoking status: Never Smoker   . Smokeless tobacco: Never Used  . Alcohol Use: No  . Drug Use: No  . Sexual Activity: Not on file   Other Topics Concern  . Not on file   Social History Narrative   He is retired from [employer].   Mother lived  into her 90's.  Father died in his 57's.  No CAD.     Review of Systems: General: negative for chills, fever, night sweats or weight changes.  Cardiovascular: negative for chest pain, dyspnea on exertion, edema, orthopnea, palpitations, paroxysmal nocturnal dyspnea or shortness of breath Dermatological: negative for rash Respiratory: negative for cough or wheezing Urologic: negative for hematuria Abdominal: negative for nausea, vomiting, diarrhea, bright red blood per rectum, melena, or hematemesis Neurologic: negative for visual changes, syncope, or dizziness All other systems reviewed and are otherwise negative except as noted above.    Blood pressure 160/90, pulse 65, height 5\' 11"  (1.803 m), weight 195 lb (88.451 kg).  General appearance: alert, cooperative and no distress Lungs: clear to  auscultation bilaterally Heart: regular rate and rhythm Extremities: no edema  EKG NSR 65  ASSESSMENT AND PLAN:   Fatigue Pt feels this is from Metoprolol  Chronic anticoagulation Coumadin clinic  Paroxysmal a-fib S/P TEE CV 06/22/14- Amiodarone re loaded and dose increased to 200 mg BID  Hypothyroidism TSH 5- free T4 WNL  CAD (coronary artery disease) PCI '02, '06, and '07   PLAN  I suggested we decrease his Amiodarone to 200 mg daily in am, and decrease his Metoprolol to 12.5 mg Q HS. He should follow up with Dr Percival Spanish in a few weeks.   [facility] KPA-C 06/26/2014 3:37 PM

## 2014-06-26 NOTE — Telephone Encounter (Signed)
Pt notified about lab results per Melina Copa, PA. Pt c/o weakness since d/c on Metoprolol 12.5 mg bid, while taking along w/Amiodarone. Pt states he has taken both of these together before and had the same reaction. I advised pt to go Ed if feeling worse. I will d/w Melina Copa, PA about how pt is feeling. I did also advise pt to call PCP about thyroid levels and that he will probably need adjustment w/levothyroxine. Will fax results to PCP. Pt agreeable to Plan of Care.

## 2014-06-29 ENCOUNTER — Telehealth: Payer: Self-pay | Admitting: *Deleted

## 2014-06-29 NOTE — Telephone Encounter (Signed)
Returned call to patient. He states he is feeling fatigue and feeling like metoprolol is "wiping him out". He says he is sleeping 14 hours daily. He states he DID NOT take metoprolol tartrate 12.5mg  last nite. He saw [place], [place] on Friday and his amiodarone and metoprolol were both decreased. RN advised patient to take medications as prescribed, to monitor BP and HR, and record this vital signs and take with him to OV on Friday 12/18 with D. Dunn, PA. Patient was informed he is on the lowest effective dose of these medications for him, as prescribed by cardiology providers, and that if he can tolerate the SE of the metoprolol to take the medication, but to call back should his symptoms worsen. Advised him that a record of his home BP and HR readings could guide the cardiology provider in making med changes, should they be warranted at his [place] on 12/18. Patient voiced understanding of plan.

## 2014-06-29 NOTE — Telephone Encounter (Signed)
Pt has been sch with 12/17 115pm

## 2014-06-29 NOTE — Telephone Encounter (Signed)
Spoke to pt, told him Dr. Sherren Mocha reviewed Thyroid labs and said they were normal and to continue current dosage of Levothyroxine. Also do not need to keep appointment with Dr. Sherren Mocha due to followed up with Cardiology. Told pt I will cancel appointment and just need to call if any problems. Pt verbalized understanding. Kathryne Hitch in scheduling and told her to cancel appt for pt this Thurs with Dr. Sherren Mocha.

## 2014-07-02 ENCOUNTER — Ambulatory Visit: Payer: Medicare Other | Admitting: Family Medicine

## 2014-07-02 ENCOUNTER — Encounter: Payer: Self-pay | Admitting: Physician Assistant

## 2014-07-02 DIAGNOSIS — D649 Anemia, unspecified: Secondary | ICD-10-CM | POA: Insufficient documentation

## 2014-07-02 NOTE — Progress Notes (Addendum)
[place], [place], [place]  [address] Phone: [phone] Fax:  [phone]  Date:  07/03/2014   Patient ID:  [name], [name] 08-Nov-1926, MRN [id number]   PCP:  Nyoka Cowden, MD  Cardiologist: Percival Spanish   History of Present Illness: [name] is a 80 y.o. male with history of CAD s/p remote PCI (stent to OM 2002, BMS 2006 to RCA, cath 2007), PAF on coumadin, hypothyroidism, HTN, HLD, mild-mod MR (TEE 06/2014) who presents for follow-up. He was admitted 12/4-12/8 with elevated heart rate and near syncope similar to prior episodes of atrial fibrillation. He had otherwise been in his usual state of health except for a fall several days prior in which he struck his back. Per H/P, X-rays at the time did not show any fracture. In the ED he was found to be in AF-RVR. He was treated with IV amiodarone which was later transitioned back to oral form at a higher dose at discharge. BB was also added for additional rate control.  On 06/22/14 underwent TEE/DCCV to NSR. (TEE for INR of 1.3 in November.) TEE: no LAA thrombus, EF 55-60%, moderate MR, trivial AI. Once converted to NSR, the patient felt markedly better. TSH was 5.160, T3 low at 52.6, free T4 wnl and he was asked to discuss with PCP. He was seen in clinic last week for fatigue at which time Kerin Ransom PA-C dropped amiodarone back down to 200mg  daily and decreased his Lopressor from 12.5mg  BID to 12.5mg  nightly. Per patient report he subsequently spoke to someone at Dr. Rosezella [place] office and they took him off this completely. I cannot find any phone notes documenting this. His son says he historically does poorly on metoprolol. Now that he is off of it, he is gradually feeling better. No CP, SOB, near syncope or syncope.  Recent Labs: 03/20/2014: Pro B Natriuretic peptide (BNP) 2049.0* 06/19/2014: Hemoglobin 12.3* 06/20/2014: ALT 13; Creatinine 1.27; Potassium 4.2 06/23/2014: TSH 5.160*  Wt Readings from Last 3  Encounters:  07/03/14 192 lb 9.6 oz (87.363 kg)  06/26/14 195 lb (88.451 kg)  06/23/14 193 lb 6.4 oz (87.726 kg)     Past Medical History  Diagnosis Date  . HYPOTHYROIDISM 05/20/2007  . HYPERLIPIDEMIA 10/20/2008  . DEPRESSION 12/17/2007  . HYPERTENSION 04/10/2007  . CORONARY ARTERY DISEASE     a. s/p stent OM 2002. b. s/p BMS 2006 RCA  c. s/p cath 2007.  [name] GERD 04/10/2007  . DIVERTICULOSIS, COLON 04/10/2007  . BENIGN PROSTATIC HYPERTROPHY 04/10/2007  . GYNECOMASTIA, UNILATERAL 03/23/2010  . SLEEP APNEA 05/20/2007  . Hematoma of abdominal wall   . Mallory - Weiss tear     > 5 years ago  . Barrett's esophagus   . Popliteal artery embolism, right   . History of echocardiogram     a. EF 55% (TEE 08/2011)  . Paroxysmal a-fib     a. chronic amiodarone and coumadin;  b. 05/2013 s/p DCCV. c. 06/2014 s/p TEE/DCCV.  [name] Chronic anticoagulation     Coumadin  . Mitral regurgitation     a. Mild-mod by TEE 06/2014.  [name] Anemia     Current Outpatient Prescriptions  Medication Sig Dispense Refill  . amiodarone (PACERONE) 200 MG tablet Take 1 tablet (200 mg total) by mouth every morning.    . clonazePAM (KLONOPIN) 1 MG tablet Take 1 mg by mouth at bedtime.      [name] DIGESTIVE ENZYMES PO Take 1 tablet by mouth 2 (two)  times daily.     . ferrous sulfate 325 (65 FE) MG tablet Take 325 mg by mouth 2 (two) times daily with a meal.    . fish oil-omega-3 fatty acids 1000 MG capsule Take 1 g by mouth 2 (two) times daily.     [name] HYDROcodone-acetaminophen (NORCO/VICODIN) 5-325 MG per tablet Take 1 tablet by mouth every 6 (six) hours as needed for moderate pain. 30 tablet 0  . hydroxypropyl methylcellulose (ISOPTO TEARS) 2.5 % ophthalmic solution Place 1 drop into both eyes 3 (three) times daily as needed for dry eyes.     [name] levothyroxine (SYNTHROID, LEVOTHROID) 25 MCG tablet Take 25 mcg by mouth daily.     . Multiple Vitamin (MULTIVITAMIN) capsule Take 1 capsule by mouth daily.    . nitroGLYCERIN (NITROSTAT) 0.4 MG SL  tablet Place 0.4 mg under the tongue every 5 (five) minutes as needed for chest pain.     . pantoprazole (PROTONIX) 40 MG tablet Take 40 mg by mouth 2 (two) times daily.     . pravastatin (PRAVACHOL) 40 MG tablet Take 40 mg by mouth every evening.    . traZODone (DESYREL) 50 MG tablet Take 50-100 mg by mouth at bedtime as needed for sleep.    [name] warfarin (COUMADIN) 5 MG tablet Take 2.5-5 mg by mouth daily. 1/2 tablet everyday (2.5mg ) except 1 tablet (5mg ) on Mondays, Thursdays and Saturdays     No current facility-administered medications for this visit.    Allergies:   Metoprolol tartrate   Social History:  The patient  reports that he has never smoked. He has never used smokeless tobacco. He reports that he does not drink alcohol or use illicit drugs.   Family History:  The patient's family history includes Heart attack in his mother; Heart disease in his brother; Hypertension in his other. There is no history of Stroke.   ROS:  Please see the history of present illness.    All other systems reviewed and negative.   PHYSICAL EXAM: VS:  BP 130/80 mmHg  Pulse 59  Ht 5\' 11"  (1.803 m)  Wt 192 lb 9.6 oz (87.363 kg)  BMI 26.87 kg/m2 Well nourished, well developed WM in no acute distress HEENT: normal Neck: no JVD Cardiac:  normal S1, S2; RRR; no murmur Lungs:  clear to auscultation bilaterally, no wheezing, rhonchi or rales Abd: soft, nontender, no hepatomegaly Ext: no edema Skin: warm and dry Neuro:  moves all extremities spontaneously, no focal abnormalities noted, no significant change from prior  EKG:  Sinus bradycardia 59bpm, nonspecific T wave abnormality (TWI in V2), QTc 569    ASSESSMENT AND PLAN:  1. Paroxysmal atrial fibrillation with recent fatigue -Feeling better off metoprolol. He is maintaining NSR. In the future, if we need to add other forms of rate control, we might consider diltiazem instead. He will continue current dose of amiodarone for now. Further monitoring of  organ systems while on amiodarone will be at discretion of primary cardiologist. If he has further AF, would consider referral to EP as options are limited.  He will have his INR checked today. 2. CAD s/p remote PCI - No anginal symptoms. I have made no med changes today. 3. Mild-mod MR by TEE 06/2014 - will follow clinically. 4. Anemia, chronic, mild - f/u PCP. 5. Hypothyroidism -  I have asked him to f/u with his PCP for recently abnormal thyroid function in the hospital as this may also be playing a role in his recently acute  on chronic fatigue.  Dispo: F/u as scheduled with Dr. Percival Spanish in January.  Signed, Melina Copa, PA-C  07/03/2014 10:43 AM

## 2014-07-03 ENCOUNTER — Ambulatory Visit (INDEPENDENT_AMBULATORY_CARE_PROVIDER_SITE_OTHER): Payer: Medicare Other

## 2014-07-03 ENCOUNTER — Ambulatory Visit (INDEPENDENT_AMBULATORY_CARE_PROVIDER_SITE_OTHER): Payer: Medicare Other | Admitting: Physician Assistant

## 2014-07-03 ENCOUNTER — Encounter: Payer: Self-pay | Admitting: Physician Assistant

## 2014-07-03 ENCOUNTER — Telehealth: Payer: Self-pay | Admitting: *Deleted

## 2014-07-03 VITALS — BP 130/80 | HR 59 | Ht 71.0 in | Wt 192.6 lb

## 2014-07-03 DIAGNOSIS — E039 Hypothyroidism, unspecified: Secondary | ICD-10-CM | POA: Diagnosis not present

## 2014-07-03 DIAGNOSIS — R5383 Other fatigue: Secondary | ICD-10-CM

## 2014-07-03 DIAGNOSIS — I480 Paroxysmal atrial fibrillation: Secondary | ICD-10-CM | POA: Diagnosis not present

## 2014-07-03 DIAGNOSIS — Z7901 Long term (current) use of anticoagulants: Secondary | ICD-10-CM

## 2014-07-03 DIAGNOSIS — I4891 Unspecified atrial fibrillation: Secondary | ICD-10-CM | POA: Diagnosis not present

## 2014-07-03 DIAGNOSIS — I340 Nonrheumatic mitral (valve) insufficiency: Secondary | ICD-10-CM

## 2014-07-03 DIAGNOSIS — I2510 Atherosclerotic heart disease of native coronary artery without angina pectoris: Secondary | ICD-10-CM

## 2014-07-03 DIAGNOSIS — Z5181 Encounter for therapeutic drug level monitoring: Secondary | ICD-10-CM

## 2014-07-03 DIAGNOSIS — D649 Anemia, unspecified: Secondary | ICD-10-CM

## 2014-07-03 LAB — POCT INR: INR: 2.9

## 2014-07-03 NOTE — Telephone Encounter (Signed)
Pt and son [name] presented to office stating pt was at Cardiology again today for follow up and the PA said he needs to have his thyroid medication adjusted due to T3 is low and that is why he is not feeling good. Kyra Leyland and pt I will address labs again with Dr. Raliegh Ip on Monday when he is back and give pt a call on Monday. Pt and [name] verbalized understanding.

## 2014-07-03 NOTE — Patient Instructions (Signed)
Your physician recommends that you continue on your current medications as directed. Please refer to the Current Medication list given to you today.     

## 2014-07-06 MED ORDER — LEVOTHYROXINE SODIUM 50 MCG PO TABS: 50.0000 ug | ORAL_TABLET | Freq: Every day | ORAL | Status: DC

## 2014-07-06 NOTE — Telephone Encounter (Signed)
Discussed lab results with Dr. Raliegh Ip will increase to 50 mcg one tablet daily.

## 2014-07-06 NOTE — Telephone Encounter (Signed)
Spoke to pt, told him discussed thyroid labs with Dr. Raliegh Ip, will increase levothyroxine to 50 mcg daily, Rx sent to pharmacy. Pt verbalized understanding.

## 2014-07-09 ENCOUNTER — Other Ambulatory Visit: Payer: Self-pay | Admitting: *Deleted

## 2014-07-09 MED ORDER — LEVOTHYROXINE SODIUM 50 MCG PO TABS: 50.0000 ug | ORAL_TABLET | Freq: Every day | ORAL | Status: DC

## 2014-07-24 ENCOUNTER — Encounter: Payer: Self-pay | Admitting: Cardiology

## 2014-07-24 ENCOUNTER — Ambulatory Visit (INDEPENDENT_AMBULATORY_CARE_PROVIDER_SITE_OTHER): Payer: Medicare Other | Admitting: Pharmacist Clinician (PhC)/ Clinical Pharmacy Specialist

## 2014-07-24 ENCOUNTER — Ambulatory Visit (INDEPENDENT_AMBULATORY_CARE_PROVIDER_SITE_OTHER): Payer: Medicare Other | Admitting: Cardiology

## 2014-07-24 VITALS — BP 155/78 | HR 59 | Ht 71.0 in | Wt 196.8 lb

## 2014-07-24 DIAGNOSIS — Z5181 Encounter for therapeutic drug level monitoring: Secondary | ICD-10-CM

## 2014-07-24 DIAGNOSIS — R0989 Other specified symptoms and signs involving the circulatory and respiratory systems: Secondary | ICD-10-CM | POA: Diagnosis not present

## 2014-07-24 DIAGNOSIS — I4891 Unspecified atrial fibrillation: Secondary | ICD-10-CM | POA: Diagnosis not present

## 2014-07-24 DIAGNOSIS — Z7901 Long term (current) use of anticoagulants: Secondary | ICD-10-CM

## 2014-07-24 LAB — POCT INR: INR: 1.8

## 2014-07-24 NOTE — Progress Notes (Signed)
HPI The patient presents for followup after recent hospitalization for atrial fibrillation.   This is one of multiple admissions. He was placed on IV amiodarone as quickly by mouth amiodarone.He underwent TEE guided cardioversion. Of note he did have some moderate mitral regurgitation at that time. He has continued to feel very fatigued. His amiodarone was reduced to 200 mg daily. Lopressor was reduced and subsequently discontinued.    Since last being seen he has done relatively well.  He is continuing to have fatigue but he's not having any overt symptoms and certainly hasn't had any symptoms suggestive of recurrent fibrillation.  Allergies  Allergen Reactions  . Metoprolol Tartrate Other (See Comments)    Anxiety, heart rate goes to low, feels like in a fog.    Current Outpatient Prescriptions  Medication Sig Dispense Refill  . amiodarone (PACERONE) 200 MG tablet Take 1 tablet (200 mg total) by mouth every morning.    . clonazePAM (KLONOPIN) 1 MG tablet Take 1 mg by mouth at bedtime.      [name] DIGESTIVE ENZYMES PO Take 1 tablet by mouth 2 (two) times daily.     . ferrous sulfate 325 (65 FE) MG tablet Take 325 mg by mouth 2 (two) times daily with a meal.    . fish oil-omega-3 fatty acids 1000 MG capsule Take 1 g by mouth 2 (two) times daily.     . hydroxypropyl methylcellulose (ISOPTO TEARS) 2.5 % ophthalmic solution Place 1 drop into both eyes 3 (three) times daily as needed for dry eyes.     [name] levothyroxine (SYNTHROID, LEVOTHROID) 50 MCG tablet Take 1 tablet (50 mcg total) by mouth daily. 90 tablet 3  . Multiple Vitamin (MULTIVITAMIN) capsule Take 1 capsule by mouth daily.    . nitroGLYCERIN (NITROSTAT) 0.4 MG SL tablet Place 0.4 mg under the tongue every 5 (five) minutes as needed for chest pain.     . pantoprazole (PROTONIX) 40 MG tablet Take 40 mg by mouth 2 (two) times daily.     . pravastatin (PRAVACHOL) 40 MG tablet Take 40 mg by mouth every evening.    . traZODone (DESYREL) 50 MG  tablet Take 50-100 mg by mouth at bedtime as needed for sleep.    [name] warfarin (COUMADIN) 5 MG tablet Take 2.5-5 mg by mouth daily. 1/2 tablet everyday (2.5mg ) except 1 tablet (5mg ) on Mondays, Thursdays and Saturdays     No current facility-administered medications for this visit.    Past Medical History  Diagnosis Date  . HYPOTHYROIDISM 05/20/2007  . HYPERLIPIDEMIA 10/20/2008  . DEPRESSION 12/17/2007  . HYPERTENSION 04/10/2007  . CORONARY ARTERY DISEASE     a. s/p stent OM 2002. b. s/p BMS 2006 RCA  c. s/p cath 2007.  [name] GERD 04/10/2007  . DIVERTICULOSIS, COLON 04/10/2007  . BENIGN PROSTATIC HYPERTROPHY 04/10/2007  . GYNECOMASTIA, UNILATERAL 03/23/2010  . SLEEP APNEA 05/20/2007  . Hematoma of abdominal wall   . Mallory - Weiss tear     > 5 years ago  . Barrett's esophagus   . Popliteal artery embolism, right   . History of echocardiogram     a. EF 55% (TEE 08/2011)  . Paroxysmal a-fib     a. chronic amiodarone and coumadin;  b. 05/2013 s/p DCCV. c. 06/2014 s/p TEE/DCCV.  [name] Chronic anticoagulation     Coumadin  . Mitral regurgitation     a. Mild-mod by TEE 06/2014.  [name] Anemia     Past Surgical History  Procedure Laterality Date  .  Transurethral resection of prostate    . Coronary angioplasty with stent placement    . Tee with cardioversion  7/08  . Cardioversion  8/08  . Esophagogastroduodenoscopy    . Tee without cardioversion  09/14/2011    Procedure: TRANSESOPHAGEAL ECHOCARDIOGRAM (TEE);  Surgeon: Josue Hector, MD;  Location: [place];  Service: Cardiovascular;  Laterality: N/A;  . Cardioversion  09/14/2011    Procedure: CARDIOVERSION;  Surgeon: Josue Hector, MD;  Location: [place];  Service: Cardiovascular;  Laterality: N/A;  . Cardioversion N/A 03/23/2014    Procedure: CARDIOVERSION;  Surgeon: Thompson Grayer, MD;  Location: [facility];  Service: Cardiovascular;  Laterality: N/A;  . Tee without cardioversion N/A 06/22/2014    Procedure: TRANSESOPHAGEAL ECHOCARDIOGRAM (TEE);   Surgeon: [name], MD;  [facility] ENDOSCOPY;  Service: Cardiovascular;  Laterality: N/A;  . Cardioversion N/A 06/22/2014    Procedure: CARDIOVERSION;  Surgeon: [name], MD;  Location: [facility] ENDOSCOPY;  Service: Cardiovascular;  Laterality: N/A;    ROS:  As stated in the HPI and negative for all other systems.  PHYSICAL EXAM BP 155/78 mmHg  Pulse 59  Ht 5\' 11"  (1.803 m)  Wt 196 lb 12.8 oz (89.268 kg)  BMI 27.46 kg/m2 GENERAL: No acute distress NECK:  No jugular venous distention, waveform within normal limits, carotid upstroke brisk and symmetric, no bruits, no thyromegaly LYMPHATICS:  No cervical, inguinal adenopathy LUNGS:  Clear to auscultation bilaterally BACK:  No CVA tenderness CHEST:  Unremarkable HEART:  PMI not displaced or sustained,S1 and S2 within normal limits, no S3, no S4, no clicks, no rubs, apical and axillary late systolic murmur ABD:  Flat, positive bowel sounds normal in frequency in pitch, no bruits, no rebound, no guarding, no midline pulsatile mass, no hepatomegaly, no splenomegaly EXT:  2 plus pulses throughout, no edema, no cyanosis no clubbing   ASSESSMENT AND PLAN  ATRIAL FIBRILLATION - He is maintaining NSR after this most recent cardioversion.  Continue amiodarone  BETA BLOCKER INTOLERANCE - This will be added to his list of allergies.  CORONARY ARTERY DISEASE -  The patient has no new sypmtoms.  No further cardiovascular testing is indicated.  We will continue with aggressive risk reduction and meds as listed.  HYPERTENSION -  The blood pressure is at target. No change in medications is indicated. We will continue with therapeutic lifestyle changes (TLC).    MITRAL REGURGITATION -  This was moderate on the last TEE and we will follow this clinically.  FATIGUE - Unchanged

## 2014-07-24 NOTE — Patient Instructions (Signed)
Your physician recommends that you schedule a follow-up appointment in: 3 months with Dr. Hochrein  

## 2014-08-14 ENCOUNTER — Ambulatory Visit (INDEPENDENT_AMBULATORY_CARE_PROVIDER_SITE_OTHER): Payer: Medicare Other | Admitting: Pharmacist Clinician (PhC)/ Clinical Pharmacy Specialist

## 2014-08-14 DIAGNOSIS — Z5181 Encounter for therapeutic drug level monitoring: Secondary | ICD-10-CM | POA: Diagnosis not present

## 2014-08-14 DIAGNOSIS — Z7901 Long term (current) use of anticoagulants: Secondary | ICD-10-CM

## 2014-08-14 DIAGNOSIS — I4891 Unspecified atrial fibrillation: Secondary | ICD-10-CM

## 2014-08-14 LAB — POCT INR: INR: 1.8

## 2014-08-28 ENCOUNTER — Ambulatory Visit (INDEPENDENT_AMBULATORY_CARE_PROVIDER_SITE_OTHER): Payer: Medicare Other | Admitting: Pharmacist Clinician (PhC)/ Clinical Pharmacy Specialist

## 2014-08-28 DIAGNOSIS — I4891 Unspecified atrial fibrillation: Secondary | ICD-10-CM | POA: Diagnosis not present

## 2014-08-28 DIAGNOSIS — Z7901 Long term (current) use of anticoagulants: Secondary | ICD-10-CM

## 2014-08-28 DIAGNOSIS — Z5181 Encounter for therapeutic drug level monitoring: Secondary | ICD-10-CM | POA: Diagnosis not present

## 2014-08-28 LAB — POCT INR: INR: 2

## 2014-09-18 ENCOUNTER — Telehealth: Payer: Self-pay | Admitting: Cardiology

## 2014-09-21 NOTE — Telephone Encounter (Signed)
Close encounter 

## 2014-09-23 ENCOUNTER — Ambulatory Visit (INDEPENDENT_AMBULATORY_CARE_PROVIDER_SITE_OTHER): Payer: Medicare Other | Admitting: Pharmacist Clinician (PhC)/ Clinical Pharmacy Specialist

## 2014-09-23 DIAGNOSIS — I4891 Unspecified atrial fibrillation: Secondary | ICD-10-CM | POA: Diagnosis not present

## 2014-09-23 DIAGNOSIS — Z5181 Encounter for therapeutic drug level monitoring: Secondary | ICD-10-CM | POA: Diagnosis not present

## 2014-09-23 DIAGNOSIS — Z7901 Long term (current) use of anticoagulants: Secondary | ICD-10-CM

## 2014-09-23 LAB — POCT INR: INR: 2.2

## 2014-09-24 ENCOUNTER — Other Ambulatory Visit: Payer: Self-pay | Admitting: Pharmacist Clinician (PhC)/ Clinical Pharmacy Specialist

## 2014-09-24 MED ORDER — WARFARIN SODIUM 5 MG PO TABS: ORAL_TABLET | ORAL | Status: DC

## 2014-10-21 ENCOUNTER — Ambulatory Visit (INDEPENDENT_AMBULATORY_CARE_PROVIDER_SITE_OTHER): Payer: Medicare Other | Admitting: Pharmacist Clinician (PhC)/ Clinical Pharmacy Specialist

## 2014-10-21 DIAGNOSIS — I4891 Unspecified atrial fibrillation: Secondary | ICD-10-CM | POA: Diagnosis not present

## 2014-10-21 DIAGNOSIS — Z7901 Long term (current) use of anticoagulants: Secondary | ICD-10-CM | POA: Diagnosis not present

## 2014-10-21 DIAGNOSIS — Z5181 Encounter for therapeutic drug level monitoring: Secondary | ICD-10-CM | POA: Diagnosis not present

## 2014-10-21 LAB — POCT INR: INR: 1.9

## 2014-11-11 ENCOUNTER — Ambulatory Visit (INDEPENDENT_AMBULATORY_CARE_PROVIDER_SITE_OTHER): Payer: Medicare Other | Admitting: Pharmacist Clinician (PhC)/ Clinical Pharmacy Specialist

## 2014-11-11 DIAGNOSIS — I4891 Unspecified atrial fibrillation: Secondary | ICD-10-CM

## 2014-11-11 DIAGNOSIS — Z5181 Encounter for therapeutic drug level monitoring: Secondary | ICD-10-CM | POA: Diagnosis not present

## 2014-11-11 DIAGNOSIS — Z7901 Long term (current) use of anticoagulants: Secondary | ICD-10-CM | POA: Diagnosis not present

## 2014-11-11 LAB — POCT INR: INR: 2.5

## 2014-11-12 DIAGNOSIS — N401 Enlarged prostate with lower urinary tract symptoms: Secondary | ICD-10-CM | POA: Diagnosis not present

## 2014-11-12 DIAGNOSIS — R3912 Poor urinary stream: Secondary | ICD-10-CM | POA: Diagnosis not present

## 2014-12-02 ENCOUNTER — Ambulatory Visit (INDEPENDENT_AMBULATORY_CARE_PROVIDER_SITE_OTHER): Payer: Medicare Other | Admitting: Pharmacist Clinician (PhC)/ Clinical Pharmacy Specialist

## 2014-12-02 ENCOUNTER — Ambulatory Visit (INDEPENDENT_AMBULATORY_CARE_PROVIDER_SITE_OTHER): Payer: Medicare Other | Admitting: Cardiology

## 2014-12-02 ENCOUNTER — Encounter: Payer: Self-pay | Admitting: Cardiology

## 2014-12-02 VITALS — BP 110/76 | HR 57 | Ht 71.0 in | Wt 190.0 lb

## 2014-12-02 DIAGNOSIS — I2510 Atherosclerotic heart disease of native coronary artery without angina pectoris: Secondary | ICD-10-CM | POA: Diagnosis not present

## 2014-12-02 DIAGNOSIS — Z7901 Long term (current) use of anticoagulants: Secondary | ICD-10-CM

## 2014-12-02 DIAGNOSIS — I4891 Unspecified atrial fibrillation: Secondary | ICD-10-CM | POA: Diagnosis not present

## 2014-12-02 DIAGNOSIS — Z5181 Encounter for therapeutic drug level monitoring: Secondary | ICD-10-CM

## 2014-12-02 DIAGNOSIS — Z79899 Other long term (current) drug therapy: Secondary | ICD-10-CM

## 2014-12-02 DIAGNOSIS — I2583 Coronary atherosclerosis due to lipid rich plaque: Principal | ICD-10-CM

## 2014-12-02 DIAGNOSIS — R531 Weakness: Secondary | ICD-10-CM | POA: Diagnosis not present

## 2014-12-02 LAB — TSH: TSH: 4.363 u[IU]/mL (ref 0.350–4.500)

## 2014-12-02 LAB — HEPATIC FUNCTION PANEL
ALBUMIN: 4 g/dL (ref 3.5–5.2)
ALT: 18 U/L (ref 0–53)
AST: 21 U/L (ref 0–37)
Alkaline Phosphatase: 72 U/L (ref 39–117)
BILIRUBIN TOTAL: 0.8 mg/dL (ref 0.2–1.2)
Bilirubin, Direct: 0.2 mg/dL (ref 0.0–0.3)
Indirect Bilirubin: 0.6 mg/dL (ref 0.2–1.2)
Total Protein: 6.7 g/dL (ref 6.0–8.3)

## 2014-12-02 LAB — CBC
HCT: 40.9 % (ref 39.0–52.0)
Hemoglobin: 13.6 g/dL (ref 13.0–17.0)
MCH: 32.1 pg (ref 26.0–34.0)
MCHC: 33.3 g/dL (ref 30.0–36.0)
MCV: 96.5 fL (ref 78.0–100.0)
MPV: 10.6 fL (ref 8.6–12.4)
Platelets: 159 10*3/uL (ref 150–400)
RBC: 4.24 MIL/uL (ref 4.22–5.81)
RDW: 15.4 % (ref 11.5–15.5)
WBC: 5.7 10*3/uL (ref 4.0–10.5)

## 2014-12-02 LAB — POCT INR: INR: 2.2

## 2014-12-02 NOTE — Patient Instructions (Signed)
Your physician recommends that you schedule a follow-up appointment in: 6 months with Dr. Percival Spanish  We are ordering some lab work for you to get done

## 2014-12-02 NOTE — Progress Notes (Signed)
HPI The patient presents for followup after recent hospitalization for atrial fibrillation.   This is one of multiple admissions. He is status post TEE guided cardioversion and amio most recently. Of note he did have some moderate mitral regurgitation at that time.   Since he was last seen he continues to have fatigue but no overt new complaints. He walks the track at this hospital. The patient denies any new symptoms such as chest discomfort, neck or arm discomfort. There has been no new shortness of breath, PND or orthopnea. There have been no reported palpitations, presyncope or syncope.  Allergies  Allergen Reactions  . Metoprolol Tartrate Other (See Comments)    Anxiety, heart rate goes to low, feels like in a fog.    Current Outpatient Prescriptions  Medication Sig Dispense Refill  . amiodarone (PACERONE) 200 MG tablet Take 1 tablet (200 mg total) by mouth every morning.    . clonazePAM (KLONOPIN) 1 MG tablet Take 1 mg by mouth at bedtime.      [name] DIGESTIVE ENZYMES PO Take 1 tablet by mouth 2 (two) times daily.     . ferrous sulfate 325 (65 FE) MG tablet Take 325 mg by mouth 2 (two) times daily with a meal.    . fish oil-omega-3 fatty acids 1000 MG capsule Take 1 g by mouth 2 (two) times daily.     . hydroxypropyl methylcellulose (ISOPTO TEARS) 2.5 % ophthalmic solution Place 1 drop into both eyes 3 (three) times daily as needed for dry eyes.     [name] levothyroxine (SYNTHROID, LEVOTHROID) 50 MCG tablet Take 1 tablet (50 mcg total) by mouth daily. 90 tablet 3  . Multiple Vitamin (MULTIVITAMIN) capsule Take 1 capsule by mouth daily.    . nitroGLYCERIN (NITROSTAT) 0.4 MG SL tablet Place 0.4 mg under the tongue every 5 (five) minutes as needed for chest pain.     . pantoprazole (PROTONIX) 40 MG tablet Take 40 mg by mouth 2 (two) times daily.     . pravastatin (PRAVACHOL) 40 MG tablet Take 40 mg by mouth every evening.    . traZODone (DESYREL) 50 MG tablet Take 50-100 mg by mouth at bedtime as  needed for sleep.    [name] warfarin (COUMADIN) 5 MG tablet Take 1 tablet by mouth daily or as directed by coumadin clinic 90 tablet 1   No current facility-administered medications for this visit.    Past Medical History  Diagnosis Date  . HYPOTHYROIDISM 05/20/2007  . HYPERLIPIDEMIA 10/20/2008  . DEPRESSION 12/17/2007  . HYPERTENSION 04/10/2007  . CORONARY ARTERY DISEASE     a. s/p stent OM 2002. b. s/p BMS 2006 RCA  c. s/p cath 2007.  [name] GERD 04/10/2007  . DIVERTICULOSIS, COLON 04/10/2007  . BENIGN PROSTATIC HYPERTROPHY 04/10/2007  . GYNECOMASTIA, UNILATERAL 03/23/2010  . SLEEP APNEA 05/20/2007  . Hematoma of abdominal wall   . Mallory - Weiss tear     > 5 years ago  . Barrett's esophagus   . Popliteal artery embolism, right   . History of echocardiogram     a. EF 55% (TEE 08/2011)  . Paroxysmal a-fib     a. chronic amiodarone and coumadin;  b. 05/2013 s/p DCCV. c. 06/2014 s/p TEE/DCCV.  [name] Chronic anticoagulation     Coumadin  . Mitral regurgitation     a. Mild-mod by TEE 06/2014.  [name] Anemia     Past Surgical History  Procedure Laterality Date  . Transurethral resection of prostate    .  Coronary angioplasty with stent placement    . Tee with cardioversion  7/08  . Cardioversion  8/08  . Esophagogastroduodenoscopy    . Tee without cardioversion  09/14/2011    Procedure: TRANSESOPHAGEAL ECHOCARDIOGRAM (TEE);  Surgeon: Josue Hector, MD;  Location: [place];  Service: Cardiovascular;  Laterality: N/A;  . Cardioversion  09/14/2011    Procedure: CARDIOVERSION;  Surgeon: Josue Hector, MD;  Location: [place];  Service: Cardiovascular;  Laterality: N/A;  . Cardioversion N/A 03/23/2014    Procedure: CARDIOVERSION;  Surgeon: Thompson Grayer, MD;  Location: [place];  Service: Cardiovascular;  Laterality: N/A;  . Tee without cardioversion N/A 06/22/2014    Procedure: TRANSESOPHAGEAL ECHOCARDIOGRAM (TEE);  Surgeon: [name], MD;  Location: [facility] ENDOSCOPY;  Service: Cardiovascular;   Laterality: N/A;  . Cardioversion N/A 06/22/2014    Procedure: CARDIOVERSION;  Surgeon: [name], MD;  Location: [facility] ENDOSCOPY;  Service: Cardiovascular;  Laterality: N/A;    ROS:  As stated in the HPI and negative for all other systems.  PHYSICAL EXAM BP 110/76 mmHg  Pulse 57  Ht 5\' 11"  (1.803 m)  Wt 190 lb (86.183 kg)  BMI 26.51 kg/m2 GENERAL: No acute distress NECK:  No jugular venous distention, waveform within normal limits, carotid upstroke brisk and symmetric, no bruits, no thyromegaly LYMPHATICS:  No cervical, inguinal adenopathy LUNGS:  Clear to auscultation bilaterally BACK:  No CVA tenderness CHEST:  Unremarkable HEART:  PMI not displaced or sustained,S1 and S2 within normal limits, no S3, no S4, no clicks, no rubs, apical and axillary late systolic murmur ABD:  Flat, positive bowel sounds normal in frequency in pitch, no bruits, no rebound, no guarding, no midline pulsatile mass, no hepatomegaly, no splenomegaly EXT:  2 plus pulses throughout, no edema, no cyanosis no clubbing  EKG:  Sinus rhythm, rate 57, left axis deviation, incomplete right bundle branch block, no acute ST-T wave changes.  12/02/2014   ASSESSMENT AND PLAN  ATRIAL FIBRILLATION - He is maintaining NSR after this most recent cardioversion.  Continue amiodarone.  I will check labs including a CBC today.   BETA BLOCKER INTOLERANCE - This was added to his list of allergies.  CORONARY ARTERY DISEASE -  The patient has no new sypmtoms.  No further cardiovascular testing is indicated.  We will continue with aggressive risk reduction and meds as listed.  HYPERTENSION -  The blood pressure is at target. No change in medications is indicated. We will continue with therapeutic lifestyle changes (TLC).    MITRAL REGURGITATION -  This was moderate on the last TEE and we will follow this clinically.  FATIGUE - Unchanged

## 2014-12-15 DIAGNOSIS — L578 Other skin changes due to chronic exposure to nonionizing radiation: Secondary | ICD-10-CM | POA: Diagnosis not present

## 2014-12-15 DIAGNOSIS — D485 Neoplasm of uncertain behavior of skin: Secondary | ICD-10-CM | POA: Diagnosis not present

## 2014-12-15 DIAGNOSIS — L570 Actinic keratosis: Secondary | ICD-10-CM | POA: Diagnosis not present

## 2014-12-15 DIAGNOSIS — L720 Epidermal cyst: Secondary | ICD-10-CM | POA: Diagnosis not present

## 2014-12-16 ENCOUNTER — Encounter: Payer: Self-pay | Admitting: Internal Medicine

## 2014-12-16 ENCOUNTER — Ambulatory Visit (INDEPENDENT_AMBULATORY_CARE_PROVIDER_SITE_OTHER): Payer: Medicare Other | Admitting: Pulmonary Disease

## 2014-12-16 ENCOUNTER — Ambulatory Visit (INDEPENDENT_AMBULATORY_CARE_PROVIDER_SITE_OTHER): Payer: Medicare Other | Admitting: Internal Medicine

## 2014-12-16 ENCOUNTER — Encounter: Payer: Self-pay | Admitting: Pulmonary Disease

## 2014-12-16 VITALS — BP 126/82 | HR 56 | Ht 72.0 in | Wt 187.0 lb

## 2014-12-16 VITALS — BP 130/80 | HR 60 | Temp 98.2°F | Resp 20 | Ht 71.0 in | Wt 189.0 lb

## 2014-12-16 DIAGNOSIS — I480 Paroxysmal atrial fibrillation: Secondary | ICD-10-CM | POA: Diagnosis not present

## 2014-12-16 DIAGNOSIS — G4750 Parasomnia, unspecified: Secondary | ICD-10-CM

## 2014-12-16 DIAGNOSIS — I10 Essential (primary) hypertension: Secondary | ICD-10-CM | POA: Diagnosis not present

## 2014-12-16 DIAGNOSIS — I2510 Atherosclerotic heart disease of native coronary artery without angina pectoris: Secondary | ICD-10-CM

## 2014-12-16 DIAGNOSIS — E039 Hypothyroidism, unspecified: Secondary | ICD-10-CM | POA: Diagnosis not present

## 2014-12-16 DIAGNOSIS — G4733 Obstructive sleep apnea (adult) (pediatric): Secondary | ICD-10-CM

## 2014-12-16 DIAGNOSIS — Z9989 Dependence on other enabling machines and devices: Principal | ICD-10-CM

## 2014-12-16 NOTE — Progress Notes (Signed)
Chief Complaint  Patient presents with  . Follow-up    pt.  CPAP every night, having dry mouth in the middle of the night.    History of Present Illness: [name] is a 81 y.o. male with NREM parasomnia and OSA.  He uses CPAP.  No issues with mask fit.  He has been getting dry mouth and feels his pressure runs too high sometimes.  He uses klonopin at night >> no abnormal behaviors.  TESTS: PSG 12/08/05 >> AHI 0.8, SpO2 low 85%, UARS, Central events with sleep onset PSG 09/26/06 >> AHI 0.8, SpO2 low 89%, UARS, Central events with sleep onset ASV titration 01/07/07 PSG 11/14/12 >> AHI 3, SpO2 low 89%, PLMI 0.  Reduced sleep time, no R or S sleep PSG 12/12/12 >> AHI 8.2, SpO2 low 88%, Supine AHI 25.4. BiPAP 11/13/12 to 11/21/12 >> Used on 9 of 9 nights with average 7 hrs 6 min.  Median IPAP 12 cm H2O, 95 th percentile IPAP 16 cm H2O.  Median EPAP 7 cm H2O, 95 th percentile EPAP 7 cm H2O. CPAP 01/02/13 to 03/02/13 >> Used on 59 of 60 nights with average 6 hrs 47 min. Average AHI 0.8 with median PAP 14 cm H2O and 95 th percentile PAP 15 cm H2O. Auto CPAP 01/15/14 to 04/14/14 >> used on 78 of 90 nights with average 3 hrs and 33 min. Average AHI is 0.5 with median CPAP 11 cm H2O and 95 th percentile CPAP 14 cm H20.   PMHx >> Hypothyroidism, HLD, Depression, HTN, CAD, PAF, GERD, Barrett's esophagus, BPH  PSHx, Medications, Allergies, Fhx, Shx reviewed.  Physical Exam: Blood pressure 126/82, pulse 56, height 6' (1.829 m), weight 187 lb (84.823 kg), SpO2 98 %. Body mass index is 25.36 kg/(m^2).  General - No distress ENT - No sinus tenderness, no oral exudate, no LAN Cardiac - s1s2 regular, no murmur Chest - No wheeze/rales/dullness Back - No focal tenderness Abd - Soft, non-tender Ext - No edema Neuro - Normal strength Skin - No rashes Psych - normal mood, and behavior  Assessment/Plan:  Obstructive sleep apnea. Plan: - will decrease his auto CPAP range to 5 to 12 cm H2O and get  download  NREM parasomnia. Plan: - he gets his klonopin from the [place], [place] Pager:  [phone]

## 2014-12-16 NOTE — Progress Notes (Signed)
Pre visit review using our clinic review tool, if applicable. No additional management support is needed unless otherwise documented below in the visit note. 

## 2014-12-16 NOTE — Patient Instructions (Signed)
Will arrange for pressure setting change on CPAP Follow up in 1 year

## 2014-12-16 NOTE — Patient Instructions (Signed)
Limit your sodium (Salt) intake  Return in 4 months for follow-up  

## 2014-12-16 NOTE — Progress Notes (Signed)
Subjective:    Patient ID: [name], male    DOB: 23-Apr-1927, 81 y.o.   MRN: [id number]  HPI 81 year old patient who is seen today in follow-up.  He has been followed closely by cardiology and has required multiple cardioversions.  He remains on rhythm control medications as well as chronic anticoagulation History complaint today is fatigue which has been a chronic complaint.  He has noted the decrease in exercise capacity over the past few years No shortness of breath  Recent laboratory studies reviewed  Past Medical History  Diagnosis Date  . HYPOTHYROIDISM 05/20/2007  . HYPERLIPIDEMIA 10/20/2008  . DEPRESSION 12/17/2007  . HYPERTENSION 04/10/2007  . CORONARY ARTERY DISEASE     a. s/p stent OM 2002. b. s/p BMS 2006 RCA  c. s/p cath 2007.  [name] GERD 04/10/2007  . DIVERTICULOSIS, COLON 04/10/2007  . BENIGN PROSTATIC HYPERTROPHY 04/10/2007  . GYNECOMASTIA, UNILATERAL 03/23/2010  . SLEEP APNEA 05/20/2007  . Hematoma of abdominal wall   . Mallory - Weiss tear     > 5 years ago  . Barrett's esophagus   . Popliteal artery embolism, right   . History of echocardiogram     a. EF 55% (TEE 08/2011)  . Paroxysmal a-fib     a. chronic amiodarone and coumadin;  b. 05/2013 s/p DCCV. c. 06/2014 s/p TEE/DCCV.  [name] Chronic anticoagulation     Coumadin  . Mitral regurgitation     a. Mild-mod by TEE 06/2014.  [name] Anemia     History   Social History  . Marital Status: Married    Spouse Name: N/A  . Number of Children: 1  . Years of Education: N/A   Occupational History  . retired    Social History Main Topics  . Smoking status: Never Smoker   . Smokeless tobacco: Never Used  . Alcohol Use: No  . Drug Use: No  . Sexual Activity: Not on file   Other Topics Concern  . Not on file   Social History Narrative   He is retired from Landscape [employer].   Mother lived into her 32's.  Father died in his 74's.  No CAD.    Past Surgical History  Procedure Laterality Date  . Transurethral  resection of prostate    . Coronary angioplasty with stent placement    . Tee with cardioversion  7/08  . Cardioversion  8/08  . Esophagogastroduodenoscopy    . Tee without cardioversion  09/14/2011    Procedure: TRANSESOPHAGEAL ECHOCARDIOGRAM (TEE);  Surgeon: Josue Hector, MD;  Location: [place];  Service: Cardiovascular;  Laterality: N/A;  . Cardioversion  09/14/2011    Procedure: CARDIOVERSION;  Surgeon: Josue Hector, MD;  Location: [place];  Service: Cardiovascular;  Laterality: N/A;  . Cardioversion N/A 03/23/2014    Procedure: CARDIOVERSION;  Surgeon: Thompson Grayer, MD;  Location: [place];  Service: Cardiovascular;  Laterality: N/A;  . Tee without cardioversion N/A 06/22/2014    Procedure: TRANSESOPHAGEAL ECHOCARDIOGRAM (TEE);  Surgeon: [name], MD;  Location: [facility] ENDOSCOPY;  Service: Cardiovascular;  Laterality: N/A;  . Cardioversion N/A 06/22/2014    Procedure: CARDIOVERSION;  Surgeon: [name], MD;  Location: [facility] ENDOSCOPY;  Service: Cardiovascular;  Laterality: N/A;    Family History  Problem Relation Age of Onset  . Hypertension Other   . Heart disease Brother     CAD male 1st degree relative  . Heart attack Mother   . Stroke Neg Hx     Allergies  Allergen Reactions  . Metoprolol Tartrate Other (See Comments)    Anxiety, heart rate goes to low, feels like in a fog.    Current Outpatient Prescriptions on File Prior to Visit  Medication Sig Dispense Refill  . amiodarone (PACERONE) 200 MG tablet Take 1 tablet (200 mg total) by mouth every morning.    . clonazePAM (KLONOPIN) 1 MG tablet Take 1 mg by mouth at bedtime.      [name] DIGESTIVE ENZYMES PO Take 1 tablet by mouth 2 (two) times daily.     . ferrous sulfate 325 (65 FE) MG tablet Take 325 mg by mouth 2 (two) times daily with a meal.    . fish oil-omega-3 fatty acids 1000 MG capsule Take 1 g by mouth 2 (two) times daily.     . hydroxypropyl methylcellulose (ISOPTO TEARS) 2.5 % ophthalmic solution  Place 1 drop into both eyes 3 (three) times daily as needed for dry eyes.     [name] levothyroxine (SYNTHROID, LEVOTHROID) 50 MCG tablet Take 1 tablet (50 mcg total) by mouth daily. 90 tablet 3  . Multiple Vitamin (MULTIVITAMIN) capsule Take 1 capsule by mouth daily.    . nitroGLYCERIN (NITROSTAT) 0.4 MG SL tablet Place 0.4 mg under the tongue every 5 (five) minutes as needed for chest pain.     . pantoprazole (PROTONIX) 40 MG tablet Take 40 mg by mouth 2 (two) times daily.     . pravastatin (PRAVACHOL) 40 MG tablet Take 40 mg by mouth every evening.    . traZODone (DESYREL) 50 MG tablet Take 50-100 mg by mouth at bedtime as needed for sleep.    [name] warfarin (COUMADIN) 5 MG tablet Take 1 tablet by mouth daily or as directed by coumadin clinic 90 tablet 1   No current facility-administered medications on file prior to visit.    BP 160/90 mmHg  Pulse 60  Temp(Src) 98.2 F (36.8 C) (Oral)  Resp 20  Ht 5\' 11"  (1.803 m)  Wt 189 lb (85.73 kg)  BMI 26.37 kg/m2  SpO2 98%      Review of Systems  Constitutional: Negative for fever, chills, appetite change and fatigue.  HENT: Negative for congestion, dental problem, ear pain, hearing loss, sore throat, tinnitus, trouble swallowing and voice change.   Eyes: Negative for pain, discharge and visual disturbance.  Respiratory: Negative for cough, chest tightness, wheezing and stridor.   Cardiovascular: Negative for chest pain, palpitations and leg swelling.  Gastrointestinal: Negative for nausea, vomiting, abdominal pain, diarrhea, constipation, blood in stool and abdominal distention.  Genitourinary: Negative for urgency, hematuria, flank pain, discharge, difficulty urinating and genital sores.  Musculoskeletal: Negative for myalgias, back pain, joint swelling, arthralgias, gait problem and neck stiffness.  Skin: Negative for rash.  Neurological: Positive for weakness. Negative for dizziness, syncope, speech difficulty, numbness and headaches.    Hematological: Negative for adenopathy. Does not bruise/bleed easily.  Psychiatric/Behavioral: Negative for behavioral problems and dysphoric mood. The patient is not nervous/anxious.        Objective:   Physical Exam  Constitutional: He is oriented to person, place, and time. He appears well-developed.  Blood pressure 130/80 on repeat  HENT:  Head: Normocephalic.  Right Ear: External ear normal.  Left Ear: External ear normal.  Eyes: Conjunctivae and EOM are normal.  Neck: Normal range of motion.  Cardiovascular: Normal rate and normal heart sounds.   Pulmonary/Chest: Breath sounds normal.  Abdominal: Bowel sounds are normal.  Musculoskeletal: Normal range of motion. He exhibits no edema  or tenderness.  Neurological: He is alert and oriented to person, place, and time.  Psychiatric: He has a normal mood and affect. His behavior is normal.          Assessment & Plan:   Fatigue.  This has been a chronic concern and appears stable.  Multifactorial.  We'll continue efforts at exercise regimen Hypothyroidism History of depression  Recheck 6 months or as needed

## 2014-12-17 ENCOUNTER — Other Ambulatory Visit: Payer: Self-pay

## 2014-12-17 ENCOUNTER — Other Ambulatory Visit: Payer: Self-pay | Admitting: Oncology

## 2014-12-17 DIAGNOSIS — E538 Deficiency of other specified B group vitamins: Secondary | ICD-10-CM

## 2014-12-17 DIAGNOSIS — R5383 Other fatigue: Secondary | ICD-10-CM

## 2014-12-17 MED ORDER — SYRINGE (DISPOSABLE) 1 ML MISC: 1.0000 | Status: DC

## 2014-12-17 MED ORDER — CYANOCOBALAMIN 1000 MCG/ML IJ SOLN: 1000.0000 ug | INTRAMUSCULAR | Status: DC

## 2014-12-17 NOTE — Addendum Note (Signed)
Addended by: Prentiss Bells on: 12/17/2014 10:49 AM   Modules accepted: Orders

## 2014-12-18 ENCOUNTER — Telehealth: Payer: Self-pay | Admitting: Cardiology

## 2014-12-18 NOTE — Telephone Encounter (Signed)
Spoke to patient.   He states he had requested his oncologist prescribe something to help him w/ his low B12 levels. Dr. Jana Hakim had prescribed him injectable B12 shots, he wanted to know if OK to take. Notes that at appt the other day, Dr. Burnice Logan expressed doubts as to efficacy. Still wanted to try this out, did want to make sure dosing/administration was correct, any concerns for coumadin therapy. Will route to PharmD to advise.

## 2014-12-18 NOTE — Telephone Encounter (Signed)
Pt wants to know if he can take the B-12 shots?

## 2014-12-18 NOTE — Telephone Encounter (Signed)
B12 shots are fine with his current medications.

## 2014-12-18 NOTE — Telephone Encounter (Signed)
Advised pt on [name]'s recommendation - he verbalized understanding.

## 2014-12-30 ENCOUNTER — Ambulatory Visit (INDEPENDENT_AMBULATORY_CARE_PROVIDER_SITE_OTHER): Payer: Medicare Other | Admitting: Pharmacist Clinician (PhC)/ Clinical Pharmacy Specialist

## 2014-12-30 DIAGNOSIS — Z5181 Encounter for therapeutic drug level monitoring: Secondary | ICD-10-CM | POA: Diagnosis not present

## 2014-12-30 DIAGNOSIS — Z7901 Long term (current) use of anticoagulants: Secondary | ICD-10-CM | POA: Diagnosis not present

## 2014-12-30 DIAGNOSIS — I4891 Unspecified atrial fibrillation: Secondary | ICD-10-CM

## 2014-12-30 LAB — POCT INR: INR: 2.1

## 2015-01-08 ENCOUNTER — Telehealth: Payer: Self-pay | Admitting: Internal Medicine

## 2015-01-08 MED ORDER — CLONAZEPAM 1 MG PO TABS: 1.0000 mg | ORAL_TABLET | Freq: Every day | ORAL | Status: DC

## 2015-01-08 NOTE — Telephone Encounter (Signed)
Pt request refill of the following: clonazePAM (KLONOPIN) 1 MG tablet   Phamacy:  [facility] [place] on [address]

## 2015-01-08 NOTE — Telephone Encounter (Signed)
Spoke to pt's wife Estill Bamberg, told her Rx was called into pharmacy for pt. Estill Bamberg verbalized understanding and will let him know.

## 2015-01-29 ENCOUNTER — Other Ambulatory Visit: Payer: Self-pay | Admitting: Physician Assistant

## 2015-01-29 ENCOUNTER — Ambulatory Visit (INDEPENDENT_AMBULATORY_CARE_PROVIDER_SITE_OTHER): Payer: Medicare Other | Admitting: Pharmacist Clinician (PhC)/ Clinical Pharmacy Specialist

## 2015-01-29 DIAGNOSIS — I4891 Unspecified atrial fibrillation: Secondary | ICD-10-CM | POA: Diagnosis not present

## 2015-01-29 DIAGNOSIS — L080 Pyoderma: Secondary | ICD-10-CM | POA: Diagnosis not present

## 2015-01-29 DIAGNOSIS — Z5181 Encounter for therapeutic drug level monitoring: Secondary | ICD-10-CM | POA: Diagnosis not present

## 2015-01-29 DIAGNOSIS — Z7901 Long term (current) use of anticoagulants: Secondary | ICD-10-CM | POA: Diagnosis not present

## 2015-01-29 DIAGNOSIS — L728 Other follicular cysts of the skin and subcutaneous tissue: Secondary | ICD-10-CM | POA: Diagnosis not present

## 2015-01-29 LAB — POCT INR: INR: 2.6

## 2015-02-07 ENCOUNTER — Telehealth: Payer: Self-pay | Admitting: Pulmonary Disease

## 2015-02-07 NOTE — Telephone Encounter (Signed)
Auto CPAP 01/01/15 to 01/14/15 >> used on 13 of 14 nights with average 5 hrs and 22 min.  Average AHI is 1.7 with median CPAP 8 cm H2O and 95 th percentile CPAP 11 cm H20.   Will have my nurse inform pt that CPAP report looks good.  No change to current set up needed.

## 2015-02-09 NOTE — Telephone Encounter (Signed)
LM x 1 with wife to have patient return call regarding CPAP results.

## 2015-02-17 NOTE — Telephone Encounter (Signed)
Spoke with pt wife. Requests that we call back in about an hour to discuss results with the patient.  wcb

## 2015-02-18 NOTE — Telephone Encounter (Signed)
Spoke with pt and notified of results per Dr. Sood. Pt verbalized understanding and denied any questions.  

## 2015-02-22 DIAGNOSIS — L0232 Furuncle of buttock: Secondary | ICD-10-CM | POA: Diagnosis not present

## 2015-02-24 ENCOUNTER — Ambulatory Visit (INDEPENDENT_AMBULATORY_CARE_PROVIDER_SITE_OTHER): Payer: Medicare Other | Admitting: Pharmacist Clinician (PhC)/ Clinical Pharmacy Specialist

## 2015-02-24 DIAGNOSIS — I4891 Unspecified atrial fibrillation: Secondary | ICD-10-CM

## 2015-02-24 DIAGNOSIS — Z5181 Encounter for therapeutic drug level monitoring: Secondary | ICD-10-CM

## 2015-02-24 DIAGNOSIS — Z7901 Long term (current) use of anticoagulants: Secondary | ICD-10-CM

## 2015-02-24 LAB — POCT INR: INR: 2.1

## 2015-03-24 ENCOUNTER — Ambulatory Visit (INDEPENDENT_AMBULATORY_CARE_PROVIDER_SITE_OTHER): Payer: Medicare Other | Admitting: Pharmacist Clinician (PhC)/ Clinical Pharmacy Specialist

## 2015-03-24 DIAGNOSIS — I4891 Unspecified atrial fibrillation: Secondary | ICD-10-CM

## 2015-03-24 DIAGNOSIS — Z7901 Long term (current) use of anticoagulants: Secondary | ICD-10-CM | POA: Diagnosis not present

## 2015-03-24 DIAGNOSIS — Z5181 Encounter for therapeutic drug level monitoring: Secondary | ICD-10-CM | POA: Diagnosis not present

## 2015-03-24 LAB — POCT INR: INR: 1.9

## 2015-04-13 DIAGNOSIS — L720 Epidermal cyst: Secondary | ICD-10-CM | POA: Diagnosis not present

## 2015-04-21 ENCOUNTER — Ambulatory Visit (INDEPENDENT_AMBULATORY_CARE_PROVIDER_SITE_OTHER): Payer: Medicare Other | Admitting: Pharmacist Clinician (PhC)/ Clinical Pharmacy Specialist

## 2015-04-21 DIAGNOSIS — Z5181 Encounter for therapeutic drug level monitoring: Secondary | ICD-10-CM | POA: Diagnosis not present

## 2015-04-21 DIAGNOSIS — Z7901 Long term (current) use of anticoagulants: Secondary | ICD-10-CM

## 2015-04-21 DIAGNOSIS — I4891 Unspecified atrial fibrillation: Secondary | ICD-10-CM | POA: Diagnosis not present

## 2015-04-21 LAB — POCT INR: INR: 2.5

## 2015-04-25 DIAGNOSIS — R002 Palpitations: Secondary | ICD-10-CM | POA: Diagnosis not present

## 2015-04-26 ENCOUNTER — Telehealth: Payer: Self-pay | Admitting: Cardiology

## 2015-04-26 ENCOUNTER — Emergency Department (HOSPITAL_COMMUNITY): Payer: Medicare Other

## 2015-04-26 ENCOUNTER — Encounter (HOSPITAL_COMMUNITY): Payer: Self-pay | Admitting: *Deleted

## 2015-04-26 ENCOUNTER — Observation Stay (HOSPITAL_COMMUNITY)
Admission: EM | Admit: 2015-04-26 | Discharge: 2015-04-27 | Disposition: A | Discharge status: Home / Self Care | Payer: Medicare Other | Attending: Cardiology | Admitting: Cardiology

## 2015-04-26 DIAGNOSIS — I340 Nonrheumatic mitral (valve) insufficiency: Secondary | ICD-10-CM | POA: Diagnosis present

## 2015-04-26 DIAGNOSIS — E785 Hyperlipidemia, unspecified: Secondary | ICD-10-CM | POA: Diagnosis not present

## 2015-04-26 DIAGNOSIS — K219 Gastro-esophageal reflux disease without esophagitis: Secondary | ICD-10-CM | POA: Diagnosis present

## 2015-04-26 DIAGNOSIS — Z7901 Long term (current) use of anticoagulants: Secondary | ICD-10-CM | POA: Diagnosis not present

## 2015-04-26 DIAGNOSIS — G4733 Obstructive sleep apnea (adult) (pediatric): Secondary | ICD-10-CM | POA: Diagnosis not present

## 2015-04-26 DIAGNOSIS — I4891 Unspecified atrial fibrillation: Secondary | ICD-10-CM | POA: Diagnosis not present

## 2015-04-26 DIAGNOSIS — E039 Hypothyroidism, unspecified: Secondary | ICD-10-CM | POA: Diagnosis not present

## 2015-04-26 DIAGNOSIS — I499 Cardiac arrhythmia, unspecified: Secondary | ICD-10-CM | POA: Diagnosis not present

## 2015-04-26 DIAGNOSIS — Z8249 Family history of ischemic heart disease and other diseases of the circulatory system: Secondary | ICD-10-CM | POA: Diagnosis not present

## 2015-04-26 DIAGNOSIS — I2510 Atherosclerotic heart disease of native coronary artery without angina pectoris: Secondary | ICD-10-CM | POA: Diagnosis present

## 2015-04-26 DIAGNOSIS — I10 Essential (primary) hypertension: Secondary | ICD-10-CM | POA: Diagnosis not present

## 2015-04-26 DIAGNOSIS — I480 Paroxysmal atrial fibrillation: Principal | ICD-10-CM | POA: Diagnosis present

## 2015-04-26 DIAGNOSIS — R531 Weakness: Secondary | ICD-10-CM | POA: Diagnosis not present

## 2015-04-26 DIAGNOSIS — R42 Dizziness and giddiness: Secondary | ICD-10-CM | POA: Diagnosis not present

## 2015-04-26 DIAGNOSIS — R0602 Shortness of breath: Secondary | ICD-10-CM | POA: Diagnosis not present

## 2015-04-26 LAB — BASIC METABOLIC PANEL
Anion gap: 8 (ref 5–15)
BUN: 15 mg/dL (ref 6–20)
CHLORIDE: 105 mmol/L (ref 101–111)
CO2: 28 mmol/L (ref 22–32)
Calcium: 9.2 mg/dL (ref 8.9–10.3)
Creatinine, Ser: 1.19 mg/dL (ref 0.61–1.24)
GFR calc Af Amer: >60 mL/min (ref >60)
GFR calc non Af Amer: 54 mL/min — ABNORMAL LOW (ref >60)
GLUCOSE: 103 mg/dL — AB (ref 65–99)
POTASSIUM: 4.3 mmol/L (ref 3.5–5.1)
Sodium: 141 mmol/L (ref 135–145)

## 2015-04-26 LAB — TROPONIN I: Troponin I: <0.03 ng/mL (ref <0.031)

## 2015-04-26 LAB — CBC WITH DIFFERENTIAL/PLATELET
Basophils Absolute: 0 10*3/uL (ref 0.0–0.1)
Basophils Relative: 0 %
EOS PCT: 1 %
Eosinophils Absolute: 0.1 10*3/uL (ref 0.0–0.7)
HCT: 42.8 % (ref 39.0–52.0)
Hemoglobin: 14.2 g/dL (ref 13.0–17.0)
LYMPHS ABS: 1.1 10*3/uL (ref 0.7–4.0)
LYMPHS PCT: 20 %
MCH: 32.9 pg (ref 26.0–34.0)
MCHC: 33.2 g/dL (ref 30.0–36.0)
MCV: 99.1 fL (ref 78.0–100.0)
MONO ABS: 0.6 10*3/uL (ref 0.1–1.0)
Monocytes Relative: 10 %
Neutro Abs: 4 10*3/uL (ref 1.7–7.7)
Neutrophils Relative %: 69 %
PLATELETS: 177 10*3/uL (ref 150–400)
RBC: 4.32 MIL/uL (ref 4.22–5.81)
RDW: 13.7 % (ref 11.5–15.5)
WBC: 5.8 10*3/uL (ref 4.0–10.5)

## 2015-04-26 LAB — PROTIME-INR
INR: 2.4 — ABNORMAL HIGH (ref 0.00–1.49)
Prothrombin Time: 25.9 seconds — ABNORMAL HIGH (ref 11.6–15.2)

## 2015-04-26 LAB — MAGNESIUM: Magnesium: 1.9 mg/dL (ref 1.7–2.4)

## 2015-04-26 MED ORDER — AMIODARONE HCL 200 MG PO TABS
400.0000 mg | ORAL_TABLET | Freq: Two times a day (BID) | ORAL | Status: DC
  Administered 2015-04-27: 400 mg via ORAL
  Filled 2015-04-26 (×2): qty 2

## 2015-04-26 MED ORDER — NITROGLYCERIN 0.4 MG SL SUBL: 0.4000 mg | SUBLINGUAL_TABLET | SUBLINGUAL | Status: DC | PRN

## 2015-04-26 MED ORDER — ADULT MULTIVITAMIN W/MINERALS CH
1.0000 | ORAL_TABLET | Freq: Every day | ORAL | Status: DC
  Administered 2015-04-27: 1 via ORAL
  Filled 2015-04-26: qty 1

## 2015-04-26 MED ORDER — SODIUM CHLORIDE 0.9 % IJ SOLN
3.0000 mL | Freq: Two times a day (BID) | INTRAMUSCULAR | Status: DC
  Administered 2015-04-26 – 2015-04-27 (×2): 3 mL via INTRAVENOUS

## 2015-04-26 MED ORDER — ONDANSETRON HCL 4 MG/2ML IJ SOLN: 4.0000 mg | Freq: Four times a day (QID) | INTRAMUSCULAR | Status: DC | PRN

## 2015-04-26 MED ORDER — DIGESTIVE ENZYMES PO TABS: ORAL_TABLET | Freq: Two times a day (BID) | ORAL | Status: DC

## 2015-04-26 MED ORDER — FERROUS SULFATE 325 (65 FE) MG PO TABS
325.0000 mg | ORAL_TABLET | Freq: Two times a day (BID) | ORAL | Status: DC
  Administered 2015-04-27: 325 mg via ORAL
  Filled 2015-04-26: qty 1

## 2015-04-26 MED ORDER — LEVOTHYROXINE SODIUM 50 MCG PO TABS
50.0000 ug | ORAL_TABLET | Freq: Every day | ORAL | Status: DC
  Administered 2015-04-27: 50 ug via ORAL
  Filled 2015-04-26: qty 1

## 2015-04-26 MED ORDER — ZOLPIDEM TARTRATE 5 MG PO TABS: 5.0000 mg | ORAL_TABLET | Freq: Every evening | ORAL | Status: DC | PRN

## 2015-04-26 MED ORDER — CLONAZEPAM 1 MG PO TABS
1.0000 mg | ORAL_TABLET | Freq: Every day | ORAL | Status: DC
  Administered 2015-04-26: 1 mg via ORAL
  Filled 2015-04-26: qty 1

## 2015-04-26 MED ORDER — SODIUM CHLORIDE 0.9 % IJ SOLN: 3.0000 mL | INTRAMUSCULAR | Status: DC | PRN

## 2015-04-26 MED ORDER — SODIUM CHLORIDE 0.9 % IV SOLN: 250.0000 mL | INTRAVENOUS | Status: DC | PRN

## 2015-04-26 MED ORDER — PANTOPRAZOLE SODIUM 40 MG PO TBEC
40.0000 mg | DELAYED_RELEASE_TABLET | Freq: Two times a day (BID) | ORAL | Status: DC
  Administered 2015-04-26 – 2015-04-27 (×2): 40 mg via ORAL
  Filled 2015-04-26 (×2): qty 1

## 2015-04-26 MED ORDER — OMEGA-3-ACID ETHYL ESTERS 1 G PO CAPS
1.0000 g | ORAL_CAPSULE | Freq: Two times a day (BID) | ORAL | Status: DC
  Administered 2015-04-26 – 2015-04-27 (×2): 1 g via ORAL
  Filled 2015-04-26 (×2): qty 1

## 2015-04-26 MED ORDER — FINASTERIDE 5 MG PO TABS
5.0000 mg | ORAL_TABLET | Freq: Every day | ORAL | Status: DC
  Administered 2015-04-27: 5 mg via ORAL
  Filled 2015-04-26: qty 1

## 2015-04-26 MED ORDER — OMEGA-3 FATTY ACIDS 1000 MG PO CAPS: 1.0000 g | ORAL_CAPSULE | Freq: Two times a day (BID) | ORAL | Status: DC

## 2015-04-26 MED ORDER — PRAVASTATIN SODIUM 40 MG PO TABS: 40.0000 mg | ORAL_TABLET | Freq: Every evening | ORAL | Status: DC

## 2015-04-26 MED ORDER — HYPROMELLOSE (GONIOSCOPIC) 2.5 % OP SOLN: 1.0000 [drp] | Freq: Three times a day (TID) | OPHTHALMIC | Status: DC | PRN

## 2015-04-26 MED ORDER — ALPRAZOLAM 0.25 MG PO TABS: 0.2500 mg | ORAL_TABLET | Freq: Two times a day (BID) | ORAL | Status: DC | PRN

## 2015-04-26 MED ORDER — TRAZODONE HCL 50 MG PO TABS: 50.0000 mg | ORAL_TABLET | Freq: Every evening | ORAL | Status: DC | PRN

## 2015-04-26 MED ORDER — ACETAMINOPHEN 325 MG PO TABS: 650.0000 mg | ORAL_TABLET | ORAL | Status: DC | PRN

## 2015-04-26 MED ORDER — MULTIVITAMINS PO CAPS: 1.0000 | ORAL_CAPSULE | Freq: Every day | ORAL | Status: DC

## 2015-04-26 NOTE — ED Notes (Signed)
Rhonda with cards states pt can eat and will be NPO after midnight.

## 2015-04-26 NOTE — H&P (Signed)
History and Physical   Patient ID: [name] MRN: [id number], DOB/AGE: 1927-07-09 81 y.o. Date of Encounter: 04/26/2015  Primary Physician: Nyoka Cowden, MD Primary Cardiologist: Dr Percival Spanish  Chief Complaint:  Atrial fib  HPI: [name] is a 81 y.o. male with a history of CAD, HTN, HL, BNP, GERD and atrial fibrillation on amio and coumadin. He does not tolerate the fibrillation well, becoming symptomatic. He had a TEE/DCCV (EF 55% w/ mod MR) on 06/22/2014 when he went into afib after a fall. He was last seen in the office on 12/02/2014, doing well.   He has been in his usual state of health recently, with no illnesses or problems.   Starting 10/08, he noticed a little weakness. He took his BP and it was "out of whack". The symptoms continued yesterday and he felt weak. He went to the [place] office on [address] and was told his heart was out of rhythm. He was told to take and extra amiodarone last pm and 2 today. He did so but his heart did not change.   Last pm and this am he felt extremely weak, could barely walk across the room. He would also get SOB. Symptoms improved by rest.  He did not get chest pain with this, but has had some recently. His symptoms were not exertional and were relieved by SL NTG x 1.   INR was 1.9 on 03/24/2015, but has otherwise been therapeutic for 6 months.  Past Medical History  Diagnosis Date  . HYPOTHYROIDISM 05/20/2007  . HYPERLIPIDEMIA 10/20/2008  . DEPRESSION 12/17/2007  . HYPERTENSION 04/10/2007  . CORONARY ARTERY DISEASE     a. s/p stent OM 2002. b. s/p BMS 2006 RCA  c. s/p cath 2007.  [name] GERD 04/10/2007  . DIVERTICULOSIS, COLON 04/10/2007  . BENIGN PROSTATIC HYPERTROPHY 04/10/2007  . GYNECOMASTIA, UNILATERAL 03/23/2010  . SLEEP APNEA 05/20/2007  . Hematoma of abdominal wall   . Mallory - Weiss tear     > 5 years ago  . Barrett's esophagus   . Popliteal artery embolism, right ([place])   . History of echocardiogram     a. EF  55% (TEE 06/2014)  . Paroxysmal a-fib ([facility])     a. chronic amiodarone and coumadin;  b. 05/2013 s/p DCCV. c. 06/2014 s/p TEE/DCCV.  [name] Chronic anticoagulation     Coumadin  . Mitral regurgitation     a. Mild-mod by TEE 06/2014.  [name] Anemia     Surgical History:  Past Surgical History  Procedure Laterality Date  . Transurethral resection of prostate    . Coronary angioplasty with stent placement    . Tee with cardioversion  7/08  . Cardioversion  8/08  . Esophagogastroduodenoscopy    . Tee without cardioversion  09/14/2011    Procedure: TRANSESOPHAGEAL ECHOCARDIOGRAM (TEE);  Surgeon: Josue Hector, MD;  Location: [place];  Service: Cardiovascular;  Laterality: N/A;  . Cardioversion  09/14/2011    Procedure: CARDIOVERSION;  Surgeon: Josue Hector, MD;  Location: [place];  Service: Cardiovascular;  Laterality: N/A;  . Cardioversion N/A 03/23/2014    Procedure: CARDIOVERSION;  Surgeon: Thompson Grayer, MD;  Location: [place];  Service: Cardiovascular;  Laterality: N/A;  . Tee without cardioversion N/A 06/22/2014    Procedure: TRANSESOPHAGEAL ECHOCARDIOGRAM (TEE);  Surgeon: [name], MD;  Location: [facility] ENDOSCOPY;  Service: Cardiovascular;  Laterality: N/A;  . Cardioversion N/A 06/22/2014    Procedure: CARDIOVERSION;  Surgeon: Nadean Corwin.  Hilty, MD;  Location: [place] ENDOSCOPY;  Service: Cardiovascular;  Laterality: N/A;     I have reviewed the patient's current medications. Medication Sig  amiodarone (PACERONE) 200 MG tablet Take 1 tablet (200 mg total) by mouth every morning.  clonazePAM (KLONOPIN) 1 MG tablet Take 1 tablet (1 mg total) by mouth at bedtime.  cyanocobalamin (,VITAMIN B-12,) 1000 MCG/ML injection Patient taking differently: Inject 1,000 mcg into the muscle every 30 (thirty) days.   DIGESTIVE ENZYMES PO Take 1 tablet by mouth 2 (two) times daily.   ferrous sulfate 325 (65 FE) MG tablet Take 325 mg by mouth 2 (two) times daily with a meal.  finasteride (PROSCAR) 5 MG  tablet Take 5 mg by mouth daily.  fish oil-omega-3 fatty acids 1000 MG capsule Take 1 g by mouth 2 (two) times daily.   hydroxypropyl methylcellulose (ISOPTO TEARS) 2.5 % ophthalmic solution Place 1 drop into both eyes 3 (three) times daily as needed for dry eyes.   levothyroxine (SYNTHROID, LEVOTHROID) 50 MCG tablet Take 1 tablet (50 mcg total) by mouth daily.  Multiple Vitamin (MULTIVITAMIN) capsule Take 1 capsule by mouth daily.  pantoprazole (PROTONIX) 40 MG tablet Take 40 mg by mouth 2 (two) times daily.   pravastatin (PRAVACHOL) 40 MG tablet Take 40 mg by mouth every evening.  Syringe, Disposable, 1 ML MISC 1 each by Does not apply route as directed.  traZODone (DESYREL) 50 MG tablet Take 50-100 mg by mouth at bedtime as needed for sleep.  warfarin (COUMADIN) 5 MG tablet Take 1 tablet by mouth daily or as directed by coumadin clinic  nitroGLYCERIN (NITROSTAT) 0.4 MG SL tablet Place 0.4 mg under the tongue every 5 (five) minutes as needed for chest pain.    Scheduled Meds: Continuous Infusions: PRN Meds:.  Allergies:  Allergies  Allergen Reactions  . Metoprolol Tartrate Other (See Comments)    Anxiety, heart rate goes too low, feels like in a fog.    Social History   Social History  . Marital Status: Married    Spouse Name: N/A  . Number of Children: 1  . Years of Education: N/A   Occupational History  . Retired    Social History Main Topics  . Smoking status: Never Smoker   . Smokeless tobacco: Never Used  . Alcohol Use: No  . Drug Use: No  . Sexual Activity: Not on file   Other Topics Concern  . Not on file   Social History Narrative   He is retired from Landscape [employer].   Mother lived into her 40's.  Father died in his 47's.  No CAD.    Family History  Problem Relation Age of Onset  . Hypertension Other   . Heart disease Brother     CAD male 1st degree relative  . Heart attack Mother   . Stroke Neg Hx    Family Status  Relation Status Death Age  .  Mother Deceased   . Father Deceased     Review of Systems:   Full 14-point review of systems otherwise negative except as noted above.  Physical Exam: Blood pressure 136/90, pulse 110, resp. rate 11, height 6' (1.829 m), weight 186 lb (84.369 kg), SpO2 99 %. General: Well developed, well nourished,male in no acute distress. Head: Normocephalic, atraumatic, sclera non-icteric, no xanthomas, nares are without discharge. Dentition: moderate Neck: No carotid bruits. JVD not elevated. No thyromegally Lungs: Good expansion bilaterally. without wheezes or rhonchi.  Heart: IRRegular rate and rhythm with S1 S2.  No S3 or S4.  Soft murmur, no rubs, or gallops appreciated. Abdomen: Soft, non-tender, non-distended with normoactive bowel sounds. No hepatomegaly. No rebound/guarding. No obvious abdominal masses. Msk:  Strength and tone appear normal for age. No joint deformities or effusions, no spine or costo-vertebral angle tenderness. Extremities: No clubbing or cyanosis. No edema.  Distal pedal pulses are 2+ in 4 extrem Neuro: Alert and oriented X 3. Moves all extremities spontaneously. No focal deficits noted. Psych:  Responds to questions appropriately with a normal affect. Skin: No rashes or lesions noted  Labs:   Lab Results  Component Value Date   WBC 5.8 04/26/2015   HGB 14.2 04/26/2015   HCT 42.8 04/26/2015   MCV 99.1 04/26/2015   PLT 177 04/26/2015    Recent Labs  04/26/15 1136  INR 2.40*     Recent Labs Lab 04/26/15 1136  NA 141  K 4.3  CL 105  CO2 28  BUN 15  CREATININE 1.19  CALCIUM 9.2  GLUCOSE 103*    Recent Labs  04/26/15 1128  TROPONINI <0.03    Radiology/Studies: Dg Chest Port 1 View 04/26/2015   CLINICAL DATA:  Weakness and dizziness for the past 2 days, history of coronary artery disease with stent placement, hypertension, valvular heart disease  EXAM: PORTABLE CHEST 1 VIEW  COMPARISON:  PA and lateral chest x-ray of June 19, 2014  FINDINGS: The  lungs are well-expanded. There is no focal infiltrate. There is no pleural effusion. The cardiac silhouette remains enlarged. The pulmonary vascularity is normal. There is tortuosity of the descending thoracic aorta. The bony thorax is unremarkable.  IMPRESSION: Stable cardiomegaly. There is no evidence of CHF, pneumonia, nor other acute cardiopulmonary abnormality.   Electronically Signed   By: David  Martinique M.D.   On: 04/26/2015 12:00   Echo: 06/22/2014 - Left ventricle: There was mild concentric hypertrophy. Systolic function was normal. The estimated ejection fraction was in the range of 55% to 60%. Wall motion was normal; there were no regional wall motion abnormalities. No evidence of thrombus. - Aortic valve: No evidence of vegetation. - Left atrium: The atrium was dilated. No evidence of thrombus in the atrial cavity or appendage. No evidence of thrombus in the atrial cavity or appendage. - Right atrium: No evidence of thrombus in the atrial cavity or appendage. - Atrial septum: No defect or patent foramen ovale was identified. - Mitral valve: Mild to moderate regurgitation - Tricuspid valve: No evidence of vegetation. - Pulmonic valve: No evidence of vegetation. Impressions: - Successful cardioversion. No cardiac source of emboli was indentified.  ECG: 04/26/2015 Atrial fibrillation  RVR, rate 104 Incomplete RBBB and LAFB  April 2007      LAD 40% tubular lesion in the ostium, mid section 50-60% stenosis, diagonal 70%  ostial stenosis, circumflex 30% stenosis at the ostium and 40% stenosis  of the first obtuse marginal, right coronary artery is dominant. There  is a stent with 30% in the mid-right coronary artery. Distal stent had  50-60% in-stent restenosis. The PDA had a 40% stenosis.   ASSESSMENT AND PLAN:  Principal Problem:   Paroxysmal a-fib ([place]), Rapid Ventricular Response - He tolerates it very poorly. - Has been on amio since  2008, requires infrequent DCCV  - has tolerated the amio well - consider DCCV, consider calling anesthesia to do today.    Active Problems:   OSA (obstructive sleep apnea) - uses CPAP    Long term (current) use of anticoagulants - Coumadin - INR  1.9 on 03/24/2015, otherwise therapeutic x 6 mo or more  Augusto Garbe 04/26/2015 1:59 PM Beeper [phone]    History and all data above reviewed.  Patient examined.  I agree with the findings as above.  The patient has had an irregular rhythm since Saturday.  He feels his usual irregular rate and more fatigue than usual.  Denies any pain or syncope. The patient exam reveals JJH:[id number]  ,  Lungs: Clear  ,  Abd: Positive bowel sounds, no rebound no guarding, Ext No edema  .  All available labs, radiology testing, previous records reviewed. Agree with documented assessment and plan. Atrial fib:  Plan to DCCV in the AM.  He has been on anticoagulation.  Continue current amiodarone.  He is having occasional break through episodes.  However, he has not tolereated an increased dose in the past.    Minus Breeding  4:38 PM  04/26/2015

## 2015-04-26 NOTE — Progress Notes (Signed)
PHARMACIST - PHYSICIAN ORDER COMMUNICATION  CONCERNING: P&T Medication Policy on Herbal Medications  DESCRIPTION:  This patient's order for:  Co Enzyme Q10  has been noted.  This product(s) is classified as an "herbal" or natural product. Due to a lack of definitive safety studies or FDA approval, nonstandard manufacturing practices, plus the potential risk of unknown drug-drug interactions while on inpatient medications, the Pharmacy and Therapeutics Committee does not permit the use of "herbal" or natural products of this type within [facility].   ACTION TAKEN: The pharmacy department is unable to verify this order at this time and your patient has been informed of this safety policy. Please reevaluate patient's clinical condition at discharge and address if the herbal or natural product(s) should be resumed at that time.   

## 2015-04-26 NOTE — ED Notes (Signed)
Pt states he began feeling weak on Saturday and was seen at [facility] rt weakness on Sunday. Pt states he was unable to make it to the ED yesterday as requested by [facility] rt "feeling too weak". Pt states that when he woke up this morning he was feeling "completely drained and so much weaker" that he called EMS. Pt has a hx of afib. Pt states he has been cardioverted 7x rt afib and every time states he felt like he does now, extremely weak.

## 2015-04-26 NOTE — Progress Notes (Signed)
ANTICOAGULATION CONSULT NOTE - Initial Consult  Pharmacy Consult for Coumadin Indication: atrial fibrillation  Allergies  Allergen Reactions  . Metoprolol Tartrate Other (See Comments)    Anxiety, heart rate goes too low, feels like in a fog.    Patient Measurements: Height: 6' (182.9 cm) Weight: 186 lb (84.369 kg) IBW/kg (Calculated) : 77.6  Vital Signs: BP: 140/99 mmHg (10/10 1600) Pulse Rate: 112 (10/10 1600)  Labs:  Recent Labs  04/26/15 1128 04/26/15 1136  HGB  --  14.2  HCT  --  42.8  PLT  --  177  LABPROT  --  25.9*  INR  --  2.40*  CREATININE  --  1.19  TROPONINI <0.03  --     Estimated Creatinine Clearance: 49.8 mL/min (by C-G formula based on Cr of 1.19).   Medical History: Past Medical History  Diagnosis Date  . HYPOTHYROIDISM 05/20/2007  . HYPERLIPIDEMIA 10/20/2008  . DEPRESSION 12/17/2007  . HYPERTENSION 04/10/2007  . CORONARY ARTERY DISEASE     a. s/p stent OM 2002. b. s/p BMS 2006 RCA  c. s/p cath 2007.  [name] GERD 04/10/2007  . DIVERTICULOSIS, COLON 04/10/2007  . BENIGN PROSTATIC HYPERTROPHY 04/10/2007  . GYNECOMASTIA, UNILATERAL 03/23/2010  . SLEEP APNEA 05/20/2007  . Hematoma of abdominal wall   . Mallory - Weiss tear     > 5 years ago  . Barrett's esophagus   . Popliteal artery embolism, right ([place])   . History of echocardiogram     a. EF 55% (TEE 06/2014)  . Paroxysmal a-fib (H[facility])     a. chronic amiodarone and coumadin;  b. 05/2013 s/p DCCV. c. 06/2014 s/p TEE/DCCV.  [name] Chronic anticoagulation     Coumadin  . Mitral regurgitation     a. Mild-mod by TEE 06/2014.  [name] Anemia    Assessment: 87yom on coumadin pta for afib, being admitted with afib RVR. Coumadin to continue. INR on admit is therapeutic at 2.4.  Per coumadin clinic notes, home dose is 2.5mg  daily except 5mg  on MWF. Last INR check there on 10/5 was 2.5.   Patient already took his dose today.  Goal of Therapy:  INR 2-3 Monitor platelets by anticoagulation protocol: Yes   Plan:   1) No further coumadin needed tonight 2) Daily INR  Deboraha Sprang 04/26/2015,4:17 PM

## 2015-04-26 NOTE — ED Notes (Signed)
Pt states he goes in and out of afib and usually lives at [facility]. Pt states every time he goes into afib he feels extremely weak. Pt is on coumadin and has been mechanically cardioverted with success in the past.

## 2015-04-26 NOTE — Telephone Encounter (Signed)
[name] called and stated this patient was seen in their walk in clinic yesterday and he stated he had taken the wrong medication and his pressure was up.  The clinic is requesting the patient be seen today.  Please let [name] at [facility] [phone]) the outcome after we have spoken with the patient.

## 2015-04-26 NOTE — ED Notes (Signed)
Cards at bedside

## 2015-04-26 NOTE — ED Provider Notes (Signed)
CSN: [id number]     Arrival date & time 04/26/15  1008 History   First MD Initiated Contact with Patient 04/26/15 1024     Chief Complaint  Patient presents with  . Atrial Fibrillation     (Consider location/radiation/quality/duration/timing/severity/associated sxs/prior Treatment) HPI  81 year old male presents with progressive weakness over the last 2-3 days. He always feels this way when he goes into A. fib. Patient does not tolerate A. fib well and has been shocked out of it 7 times. Denies any chest pain but does feel little short of breath. He feels significantly more fatigued than typical. Denies any new leg swelling. Went to urgent care yesterday and was told to double up on his amiodarone, did this today and yesterday with no relief. Last time he was cardioverted was in 12/15.   Past Medical History  Diagnosis Date  . HYPOTHYROIDISM 05/20/2007  . HYPERLIPIDEMIA 10/20/2008  . DEPRESSION 12/17/2007  . HYPERTENSION 04/10/2007  . CORONARY ARTERY DISEASE     a. s/p stent OM 2002. b. s/p BMS 2006 RCA  c. s/p cath 2007.  [name] GERD 04/10/2007  . DIVERTICULOSIS, COLON 04/10/2007  . BENIGN PROSTATIC HYPERTROPHY 04/10/2007  . GYNECOMASTIA, UNILATERAL 03/23/2010  . SLEEP APNEA 05/20/2007  . Hematoma of abdominal wall   . Mallory - Weiss tear     > 5 years ago  . Barrett's esophagus   . Popliteal artery embolism, right ([place])   . History of echocardiogram     a. EF 55% (TEE 08/2011)  . Paroxysmal a-fib (HCC)     a. chronic amiodarone and coumadin;  b. 05/2013 s/p DCCV. c. 06/2014 s/p TEE/DCCV.  [name] Chronic anticoagulation     Coumadin  . Mitral regurgitation     a. Mild-mod by TEE 06/2014.  [name] Anemia    Past Surgical History  Procedure Laterality Date  . Transurethral resection of prostate    . Coronary angioplasty with stent placement    . Tee with cardioversion  7/08  . Cardioversion  8/08  . Esophagogastroduodenoscopy    . Tee without cardioversion  09/14/2011    Procedure:  TRANSESOPHAGEAL ECHOCARDIOGRAM (TEE);  Surgeon: Josue Hector, MD;  Location: [place];  Service: Cardiovascular;  Laterality: N/A;  . Cardioversion  09/14/2011    Procedure: CARDIOVERSION;  Surgeon: Josue Hector, MD;  Location: [place];  Service: Cardiovascular;  Laterality: N/A;  . Cardioversion N/A 03/23/2014    Procedure: CARDIOVERSION;  Surgeon: Thompson Grayer, MD;  Location: [place];  Service: Cardiovascular;  Laterality: N/A;  . Tee without cardioversion N/A 06/22/2014    Procedure: TRANSESOPHAGEAL ECHOCARDIOGRAM (TEE);  Surgeon: [name], MD;  Location: [facility] ENDOSCOPY;  Service: Cardiovascular;  Laterality: N/A;  . Cardioversion N/A 06/22/2014    Procedure: CARDIOVERSION;  Surgeon: [name], MD;  Location: [facility] ENDOSCOPY;  Service: Cardiovascular;  Laterality: N/A;   Family History  Problem Relation Age of Onset  . Hypertension Other   . Heart disease Brother     CAD male 1st degree relative  . Heart attack Mother   . Stroke Neg Hx    Social History  Substance Use Topics  . Smoking status: Never Smoker   . Smokeless tobacco: Never Used  . Alcohol Use: No    Review of Systems  Constitutional: Positive for fatigue. Negative for fever.  Respiratory: Positive for shortness of breath.   Cardiovascular: Negative for chest pain and leg swelling.  Neurological: Positive for weakness.  All other systems reviewed and are  negative.     Allergies  Metoprolol tartrate  Home Medications   Prior to Admission medications   Medication Sig Start Date End Date Taking? Authorizing Provider  amiodarone (PACERONE) 200 MG tablet Take 1 tablet (200 mg total) by mouth every morning. 06/26/14   Erlene Quan, PA-C  clonazePAM (KLONOPIN) 1 MG tablet Take 1 tablet (1 mg total) by mouth at bedtime. 01/08/15   Marletta Lor, MD  cyanocobalamin (,VITAMIN B-12,) 1000 MCG/ML injection Inject 1 mL (1,000 mcg total) into the muscle as directed. 12/17/14   Chauncey Cruel, MD   DIGESTIVE ENZYMES PO Take 1 tablet by mouth 2 (two) times daily.     Historical Provider, MD  ferrous sulfate 325 (65 FE) MG tablet Take 325 mg by mouth 2 (two) times daily with a meal.    Historical Provider, MD  finasteride (PROSCAR) 5 MG tablet Take 5 mg by mouth daily. 12/22/14   Historical Provider, MD  fish oil-omega-3 fatty acids 1000 MG capsule Take 1 g by mouth 2 (two) times daily.     Historical Provider, MD  hydroxypropyl methylcellulose (ISOPTO TEARS) 2.5 % ophthalmic solution Place 1 drop into both eyes 3 (three) times daily as needed for dry eyes.     Historical Provider, MD  levothyroxine (SYNTHROID, LEVOTHROID) 50 MCG tablet Take 1 tablet (50 mcg total) by mouth daily. 07/09/14   Marletta Lor, MD  Multiple Vitamin (MULTIVITAMIN) capsule Take 1 capsule by mouth daily.    Historical Provider, MD  nitroGLYCERIN (NITROSTAT) 0.4 MG SL tablet Place 0.4 mg under the tongue every 5 (five) minutes as needed for chest pain.     Historical Provider, MD  pantoprazole (PROTONIX) 40 MG tablet Take 40 mg by mouth 2 (two) times daily.     Historical Provider, MD  pravastatin (PRAVACHOL) 40 MG tablet Take 40 mg by mouth every evening. 03/07/11   Imogene Burn, PA-C  Syringe, Disposable, 1 ML MISC 1 each by Does not apply route as directed. 12/17/14   Chauncey Cruel, MD  traZODone (DESYREL) 50 MG tablet Take 50-100 mg by mouth at bedtime as needed for sleep.    Historical Provider, MD  warfarin (COUMADIN) 5 MG tablet Take 1 tablet by mouth daily or as directed by coumadin clinic 09/24/14   Minus Breeding, MD   BP 120/83 mmHg  Pulse 96  Resp 14  Ht 6' (1.829 m)  Wt 186 lb (84.369 kg)  BMI 25.22 kg/m2  SpO2 97% Physical Exam  Constitutional: He is oriented to person, place, and time. He appears well-developed and well-nourished.  HENT:  Head: Normocephalic and atraumatic.  Right Ear: External ear normal.  Left Ear: External ear normal.  Nose: Nose normal.  Eyes: Right eye exhibits no  discharge. Left eye exhibits no discharge.  Neck: Neck supple.  Cardiovascular: Normal rate, normal heart sounds and intact distal pulses.  An irregularly irregular rhythm present.  HR ~100  Pulmonary/Chest: Effort normal and breath sounds normal.  Abdominal: Soft. There is no tenderness.  Musculoskeletal: He exhibits no edema.  Neurological: He is alert and oriented to person, place, and time.  Skin: Skin is warm and dry.  Nursing note and vitals reviewed.   ED Course  Procedures (including critical care time) Labs Review Labs Reviewed  BASIC METABOLIC PANEL - Abnormal; Notable for the following:    Glucose, Bld 103 (*)    GFR calc non Af Amer 54 (*)    All other components within  normal limits  PROTIME-INR - Abnormal; Notable for the following:    Prothrombin Time 25.9 (*)    INR 2.40 (*)    All other components within normal limits  CBC WITH DIFFERENTIAL/PLATELET  MAGNESIUM  TROPONIN I    Imaging Review Dg Chest Port 1 View  04/26/2015   CLINICAL DATA:  Weakness and dizziness for the past 2 days, history of coronary artery disease with stent placement, hypertension, valvular heart disease  EXAM: PORTABLE CHEST 1 VIEW  COMPARISON:  PA and lateral chest x-ray of June 19, 2014  FINDINGS: The lungs are well-expanded. There is no focal infiltrate. There is no pleural effusion. The cardiac silhouette remains enlarged. The pulmonary vascularity is normal. There is tortuosity of the descending thoracic aorta. The bony thorax is unremarkable.  IMPRESSION: Stable cardiomegaly. There is no evidence of CHF, pneumonia, nor other acute cardiopulmonary abnormality.   Electronically Signed   By: David  Martinique M.D.   On: 04/26/2015 12:00   I have personally reviewed and evaluated these images and lab results as part of my medical decision-making.   EKG Interpretation   Date/Time:  Monday April 26 2015 10:19:10 EDT Ventricular Rate:  104 PR Interval:    QRS Duration: 112 QT Interval:   363 QTC Calculation: 477 R Axis:   -42 Text Interpretation:  Atrial fibrillation Incomplete RBBB and LAFB  Borderline prolonged QT interval Confirmed by Koa Zoeller  MD, Melecio Cueto ([id number])  on 04/26/2015 10:34:56 AM      MDM   Final diagnoses:  Paroxysmal atrial fibrillation (HCC)    No focal signs or symptoms besides the recurrent afib. Patient states he always has to be shocked out of this. Mild tachycardia. I have consulted cardiology, currently awaiting their disposition. HDS at this time. Care transferred with cards dispo pending.    Sherwood Gambler, MD 04/26/15 [phone]

## 2015-04-26 NOTE — Telephone Encounter (Signed)
Called patient. He reports feeling poorly over weekend. "Heart out of rhythm", "weak as water". Sounds SOB on phone. Had recent error in dose of pacerone, could not tell me specific numbers, but he thinks he took too much medication yesterday. He feels like he cannot get up safely without concern for falling. Pt told me he considered coming in here for BP check/advice, he was also considering going to ED. I advised against coming into office - concern for patient safety. ED better option based on how poorly he reports feeling. He has taken BP readings and HR readings at home.  HR between 90-120, BP 102/67 and 112/86  Advised HR and BP look OK but cannot determine what else is going on - ED evaluation would be most appropriate for him.   Pt voiced understanding.  Informed Trish. Routed to Dr. Percival Spanish.

## 2015-04-27 ENCOUNTER — Encounter (HOSPITAL_COMMUNITY): Payer: Self-pay | Admitting: Anesthesiology

## 2015-04-27 ENCOUNTER — Encounter (HOSPITAL_COMMUNITY)
Admission: EM | Disposition: A | Discharge status: Home / Self Care | Payer: Self-pay | Source: Home / Self Care | Attending: Emergency Medicine

## 2015-04-27 ENCOUNTER — Observation Stay (HOSPITAL_COMMUNITY): Payer: Medicare Other | Admitting: Anesthesiology

## 2015-04-27 DIAGNOSIS — I2510 Atherosclerotic heart disease of native coronary artery without angina pectoris: Secondary | ICD-10-CM | POA: Diagnosis not present

## 2015-04-27 DIAGNOSIS — I340 Nonrheumatic mitral (valve) insufficiency: Secondary | ICD-10-CM | POA: Diagnosis not present

## 2015-04-27 DIAGNOSIS — I10 Essential (primary) hypertension: Secondary | ICD-10-CM | POA: Diagnosis not present

## 2015-04-27 DIAGNOSIS — I480 Paroxysmal atrial fibrillation: Secondary | ICD-10-CM | POA: Diagnosis not present

## 2015-04-27 DIAGNOSIS — I739 Peripheral vascular disease, unspecified: Secondary | ICD-10-CM | POA: Diagnosis not present

## 2015-04-27 DIAGNOSIS — I4891 Unspecified atrial fibrillation: Secondary | ICD-10-CM | POA: Diagnosis not present

## 2015-04-27 DIAGNOSIS — E785 Hyperlipidemia, unspecified: Secondary | ICD-10-CM | POA: Diagnosis not present

## 2015-04-27 DIAGNOSIS — E039 Hypothyroidism, unspecified: Secondary | ICD-10-CM | POA: Diagnosis not present

## 2015-04-27 HISTORY — PX: CARDIOVERSION: SHX1299

## 2015-04-27 LAB — BASIC METABOLIC PANEL
ANION GAP: 8 (ref 5–15)
BUN: 15 mg/dL (ref 6–20)
CALCIUM: 9.3 mg/dL (ref 8.9–10.3)
CHLORIDE: 106 mmol/L (ref 101–111)
CO2: 26 mmol/L (ref 22–32)
CREATININE: 1.11 mg/dL (ref 0.61–1.24)
GFR calc Af Amer: >60 mL/min (ref >60)
GFR calc non Af Amer: 59 mL/min — ABNORMAL LOW (ref >60)
GLUCOSE: 96 mg/dL (ref 65–99)
Potassium: 3.9 mmol/L (ref 3.5–5.1)
Sodium: 140 mmol/L (ref 135–145)

## 2015-04-27 LAB — PROTIME-INR
INR: 2.11 — ABNORMAL HIGH (ref 0.00–1.49)
PROTHROMBIN TIME: 23.5 s — AB (ref 11.6–15.2)

## 2015-04-27 LAB — OCCULT BLOOD X 1 CARD TO LAB, STOOL: Fecal Occult Bld: POSITIVE — AB

## 2015-04-27 SURGERY — CARDIOVERSION
Anesthesia: General

## 2015-04-27 MED ORDER — WARFARIN SODIUM 5 MG PO TABS: 5.0000 mg | ORAL_TABLET | ORAL | Status: DC

## 2015-04-27 MED ORDER — WARFARIN - PHARMACIST DOSING INPATIENT: Freq: Every day | Status: DC

## 2015-04-27 MED ORDER — PROPOFOL 10 MG/ML IV BOLUS
INTRAVENOUS | Status: DC | PRN
  Administered 2015-04-27: 80 mg via INTRAVENOUS

## 2015-04-27 MED ORDER — WARFARIN SODIUM 2.5 MG PO TABS: 2.5000 mg | ORAL_TABLET | ORAL | Status: DC

## 2015-04-27 MED ORDER — LIDOCAINE HCL (CARDIAC) 20 MG/ML IV SOLN
INTRAVENOUS | Status: DC | PRN
  Administered 2015-04-27: 20 mg via INTRAVENOUS

## 2015-04-27 NOTE — CV Procedure (Signed)
TEE:  Anesthesia Dr Glennon Mac Propofol and Lidocaine  ON Rx coumadin  DCC x1 150 J converted to NSR rate 58  No immediate neurologic sequelae  Jenkins Rouge

## 2015-04-27 NOTE — Anesthesia Postprocedure Evaluation (Signed)
  Anesthesia Post-op Note  Patient: [name]  Procedure(s) Performed: Procedure(s): CARDIOVERSION (N/A)  Patient Location: Endoscopy Unit  Anesthesia Type:General  Level of Consciousness: awake, alert  and oriented  Airway and Oxygen Therapy: Patient Spontanous Breathing and Patient connected to nasal cannula oxygen  Post-op Pain: none  Post-op Assessment: Post-op Vital signs reviewed, Patient's Cardiovascular Status Stable, Respiratory Function Stable, Patent Airway and No signs of Nausea or vomiting              Post-op Vital Signs: Reviewed and stable  Last Vitals:  Filed Vitals:   04/27/15 1301  BP:   Pulse: 60  Temp:   Resp: 16    Complications: No apparent anesthesia complications

## 2015-04-27 NOTE — Progress Notes (Signed)
Discharge teaching and instructions reviewed with pt and pts wife. VSS, pt will be discharging home via wife at 6pm.

## 2015-04-27 NOTE — Progress Notes (Signed)
ANTICOAGULATION CONSULT NOTE - Follow-up Consult  Pharmacy Consult for Coumadin Indication: atrial fibrillation  Allergies  Allergen Reactions  . Metoprolol Tartrate Other (See Comments)    Anxiety, heart rate goes too low, feels like in a fog.    Patient Measurements: Height: 6' (182.9 cm) Weight: 183 lb 8 oz (83.235 kg) IBW/kg (Calculated) : 77.6  Vital Signs: Temp: 97.8 F (36.6 C) (10/11 1342) Temp Source: Oral (10/11 1342) BP: 118/75 mmHg (10/11 1342) Pulse Rate: 59 (10/11 1342)  Labs:  Recent Labs  04/26/15 1128 04/26/15 1136 04/27/15 0450  HGB  --  14.2  --   HCT  --  42.8  --   PLT  --  177  --   LABPROT  --  25.9* 23.5*  INR  --  2.40* 2.11*  CREATININE  --  1.19 1.11  TROPONINI <0.03  --   --     Estimated Creatinine Clearance: 53.4 mL/min (by C-G formula based on Cr of 1.11).   Assessment: 87yom on coumadin pta for afib, being admitted with afib RVR. Coumadin to continue. INR remains therapeutic. CBC ok on admission. Noted amio dose increased this admission which can increase coumadin's affects. Per coumadin clinic notes, home dose is 2.5mg  daily except 5mg  on MWF.  Goal of Therapy:  INR 2-3 Monitor platelets by anticoagulation protocol: Yes   Plan:  1) Restart home dose:  Coumadin 2.5mg  daily except 5mg  MWF 2) Daily INR  Sherlon Handing, PharmD, BCPS Clinical pharmacist, pager [phone] 04/27/2015,2:24 PM

## 2015-04-27 NOTE — Interval H&P Note (Signed)
History and Physical Interval Note:  04/27/2015 9:28 AM  [name]  has presented today for surgery, with the diagnosis of a fib  The various methods of treatment have been discussed with the patient and family. After consideration of risks, benefits and other options for treatment, the patient has consented to  Procedure(s): CARDIOVERSION (N/A) as a surgical intervention .  The patient's history has been reviewed, patient examined, no change in status, stable for surgery.  I have reviewed the patient's chart and labs.  Questions were answered to the patient's satisfaction.     Jenkins Rouge

## 2015-04-27 NOTE — Anesthesia Preprocedure Evaluation (Addendum)
Anesthesia Evaluation  Patient identified by MRN, date of birth, ID band Patient awake    Reviewed: Allergy & Precautions, NPO status , Patient's Chart, lab work & pertinent test results  History of Anesthesia Complications Negative for: history of anesthetic complications  Airway Mallampati: II  TM Distance: >3 FB Neck ROM: Full    Dental  (+) Dental Advisory Given   Pulmonary sleep apnea and Continuous Positive Airway Pressure Ventilation ,    breath sounds clear to auscultation       Cardiovascular hypertension, Pt. on medications + angina (chest pain this am, Dr. Johnsie Cancel aware) + CAD and + Peripheral Vascular Disease  + dysrhythmias  Rhythm:Irregular Rate:Tachycardia  '15 ECHO: EF 55-60%, mod MR   Neuro/Psych    GI/Hepatic Neg liver ROS, GERD  Medicated and Controlled,  Endo/Other  Hypothyroidism   Renal/GU negative Renal ROS     Musculoskeletal   Abdominal   Peds  Hematology  (+) Blood dyscrasia (coumadin), ,   Anesthesia Other Findings   Reproductive/Obstetrics                          Anesthesia Physical Anesthesia Plan  ASA: III  Anesthesia Plan: General   Post-op Pain Management:    Induction: Intravenous  Airway Management Planned: Mask  Additional Equipment:   Intra-op Plan:   Post-operative Plan:   Informed Consent: I have reviewed the patients History and Physical, chart, labs and discussed the procedure including the risks, benefits and alternatives for the proposed anesthesia with the patient or authorized representative who has indicated his/her understanding and acceptance.   Dental advisory given  Plan Discussed with: Anesthesiologist, CRNA and Surgeon  Anesthesia Plan Comments:        Anesthesia Quick Evaluation

## 2015-04-27 NOTE — Progress Notes (Signed)
Patient Name: [name] Date of Encounter: 04/27/2015     Principal Problem:   Paroxysmal a-fib ([place]), Rapid Ventricular Response Active Problems:   OSA (obstructive sleep apnea)   Long term (current) use of anticoagulants - Coumadin   PAF (paroxysmal atrial fibrillation) (HCC)    SUBJECTIVE  Appears comfortable. No SOB. Mild CP last night. Awaiting DCCV. Hard of hearing  CURRENT MEDS . amiodarone  400 mg Oral BID  . clonazePAM  1 mg Oral QHS  . ferrous sulfate  325 mg Oral BID WC  . finasteride  5 mg Oral Daily  . levothyroxine  50 mcg Oral QAC breakfast  . multivitamin with minerals  1 tablet Oral Daily  . omega-3 acid ethyl esters  1 g Oral BID  . pantoprazole  40 mg Oral BID  . pravastatin  40 mg Oral QPM  . sodium chloride  3 mL Intravenous Q12H    OBJECTIVE  Filed Vitals:   04/26/15 1600 04/26/15 1653 04/26/15 2016 04/27/15 0500  BP: 140/99 143/103 141/93 123/75  Pulse: 112  62 80  Temp:  97.8 F (36.6 C) 98 F (36.7 C) 97.8 F (36.6 C)  TempSrc:  Oral Oral Oral  Resp: 13 20 20 18   Height:  6' (1.829 m)    Weight:  183 lb 12.8 oz (83.371 kg)  183 lb 8 oz (83.235 kg)  SpO2: 96% 98% 99% 98%    Intake/Output Summary (Last 24 hours) at 04/27/15 0906 Last data filed at 04/26/15 2200  Gross per 24 hour  Intake    120 ml  Output    200 ml  Net    -80 ml   Filed Weights   04/26/15 1018 04/26/15 1653 04/27/15 0500  Weight: 186 lb (84.369 kg) 183 lb 12.8 oz (83.371 kg) 183 lb 8 oz (83.235 kg)    PHYSICAL EXAM  General: Pleasant, NAD. HOH Neuro: Alert and oriented X 3. Moves all extremities spontaneously. Psych: Normal affect. HEENT:  Normal  Neck: Supple without bruits or JVD. Lungs:  Resp regular and unlabored, CTA. Heart: irreg irreg . no s3, s4, or soft sytolic murmur. Abdomen: Soft, non-tender, non-distended, BS + x 4.  Extremities: No clubbing, cyanosis or edema. DP/PT/Radials 2+ and equal bilaterally.  Accessory Clinical  Findings  CBC  Recent Labs  04/26/15 1136  WBC 5.8  NEUTROABS 4.0  HGB 14.2  HCT 42.8  MCV 99.1  PLT 655   Basic Metabolic Panel  Recent Labs  04/26/15 1136 04/27/15 0450  NA 141 140  K 4.3 3.9  CL 105 106  CO2 28 26  GLUCOSE 103* 96  BUN 15 15  CREATININE 1.19 1.11  CALCIUM 9.2 9.3  MG 1.9  --    Cardiac Enzymes  Recent Labs  04/26/15 1128  TROPONINI <0.03    TELE  afib with rate control HR currently in 90s  Radiology/Studies  Dg Chest Port 1 View  04/26/2015   CLINICAL DATA:  Weakness and dizziness for the past 2 days, history of coronary artery disease with stent placement, hypertension, valvular heart disease  EXAM: PORTABLE CHEST 1 VIEW  COMPARISON:  PA and lateral chest x-ray of June 19, 2014  FINDINGS: The lungs are well-expanded. There is no focal infiltrate. There is no pleural effusion. The cardiac silhouette remains enlarged. The pulmonary vascularity is normal. There is tortuosity of the descending thoracic aorta. The bony thorax is unremarkable.  IMPRESSION: Stable cardiomegaly. There is no evidence of CHF, pneumonia, nor  other acute cardiopulmonary abnormality.   Electronically Signed   By: David  Martinique M.D.   On: 04/26/2015 12:00    ASSESSMENT AND PLAN  [name] is a 81 y.o. male with a history of CAD, HTN, HL, BNP, GERD and atrial fibrillation on amio and coumadin ( last TEE/DCCV (EF 55% w/ mod MR) on 06/22/2014 when he went into afib after a fall) who was admitted to [facility] on 04/26/15 with recurrent symptomatic afib with RVR.   Paroxysmal a-fib, RVR- currently rate controlled on increased amio ( on 400mg  BID) At home he take 200mg  po qd. Has been on amio since 2008, requires infrequent DCCV. Has not tolerated increased amio in the past. Intolerant to BBs - He tolerates afib very poorly. DCCV @ 2PM today. Plan for discharge after observation assuming all goes well.  - CHADSVAC score of at least 3 (HTN 1, Age 61). Cont coumadin. INR 1.9 on  03/24/2015, otherwise therapeutic x 6 mo or more  OSA  - Continue CPAP   Signed, Eileen Stanford PA-C  Pager [phone]  History and all data above reviewed.  Patient examined.  I agree with the findings as above.  Feels relatively OK today.  No pain but weak.The patient exam reveals XID:[id number]  ,  Lungs: Clear  ,  Abd: Positive bowel sounds, no rebound no guarding, Ext No edema  .  All available labs, radiology testing, previous records reviewed. Agree with documented assessment and plan. Atrial fib:  DCCV today and possibly home.  Reduced amiodarone to 200 mg daily.  [name] has a CHA2DS2 - VASc score of 4 with a risk of stroke of 4%.  Jeneen Rinks Ryleigh Esqueda  10:25 AM  04/27/2015

## 2015-04-27 NOTE — Discharge Summary (Addendum)
Discharge Summary   Patient ID: [name] MRN: [id number], DOB/AGE: May 01, 1927 81 y.o. Admit date: 04/26/2015 D/C date:     04/27/2015  Primary Cardiologist: Dr. Percival Spanish  Principal Problem:   Paroxysmal a-fib ([place]), Rapid Ventricular Response Active Problems:   Hypothyroidism   HLD (hyperlipidemia)   Mitral regurgitation   CAD (coronary artery disease)   GERD   OSA (obstructive sleep apnea)   Long term (current) use of anticoagulants - Coumadin   Chronic anticoagulation    Admission Dates: 04/26/15-04/1115 Discharge Diagnosis: afib with RVR s/p successful DCCV  HPI: [name] is a 81 y.o. male with a history of HTN, HLD, BNP, GERD, CAD s/p stent OM 2002 and BMS 2006 RCAand PAF on amio and coumadin who was admitted to [facility] on 04/26/15 with recurrent symptomatic afib with RVR.  Per patient this has been his 8th DCCV. He had a TEE/DCCV (EF 55% w/ mod MR) on 06/22/2014 when he went into afib after a fall. He was last seen in the office on 12/02/2014, doing well. He was in his usual state of health until 04/24/15 when he noticed weakness. He took his BP and it was "out of whack". The symptoms continued and he continued to feel weak. He went to the [facility] outpatient office and was told his heart was out of rhythm. He was told to take and extra amiodarone that night and the following morning, which did not help. He felt so week he could barely was across the room and he presented to the ED.  He did not get chest pain with this, but has had some recently. His symptoms were not exertional and were relieved by SL NTG x 1. INR was 1.9 on 03/24/2015, but has otherwise been therapeutic for 6 months.   Hospital Course  Paroxysmal a-fib w/ RVR- amio was initially increase to 400mg  BID for rate control (at home he takes 200mg  po qd). He has not tolerated increased amio in the past so this was decreased back to 200mg  on the day of DCCV. Intolerant to BBs  - He tolerates afib very poorly.   INR 1.9 on 03/24/2015, otherwise therapeutic x 6 mo or more so DCCV was arranged. He underwent successful DCCV today and will be discharged after 4 hours of observation. - CHADSVAC score of at least 4 (HTN 1, Age 38, CAD 1). Cont coumadin.  OSA - continue CPAP   Addendum - patients FOBT came back positive ( not sure why it was ordered ). He says he has a long hx of bleeding hemorrhoids. H/H completely normal. No further w/up  The patient has had an uncomplicated hospital course and is recovering well.  He has been seen by Dr. Percival Spanish today and deemed ready for discharge home. All follow-up appointments have been scheduled.  Discharge medications are listed below.   Discharge Vitals: Blood pressure 118/75, pulse 59, temperature 97.8 F (36.6 C), temperature source Oral, resp. rate 12, height 6' (1.829 m), weight 183 lb 8 oz (83.235 kg), SpO2 98 %.  Labs: Lab Results  Component Value Date   WBC 5.8 04/26/2015   HGB 14.2 04/26/2015   HCT 42.8 04/26/2015   MCV 99.1 04/26/2015   PLT 177 04/26/2015     Recent Labs Lab 04/27/15 0450  NA 140  K 3.9  CL 106  CO2 26  BUN 15  CREATININE 1.11  CALCIUM 9.3  GLUCOSE 96    Recent Labs  04/26/15 1128  TROPONINI <0.03  Lab Results  Component Value Date   CHOL 130 10/07/2008   HDL 61.10 10/07/2008   LDLCALC 63 10/07/2008   TRIG 32.0 10/07/2008     Diagnostic Studies/Procedures   Dg Chest Port 1 View  04/26/2015   CLINICAL DATA:  Weakness and dizziness for the past 2 days, history of coronary artery disease with stent placement, hypertension, valvular heart disease  EXAM: PORTABLE CHEST 1 VIEW  COMPARISON:  PA and lateral chest x-ray of June 19, 2014  FINDINGS: The lungs are well-expanded. There is no focal infiltrate. There is no pleural effusion. The cardiac silhouette remains enlarged. The pulmonary vascularity is normal. There is tortuosity of the descending thoracic aorta. The bony thorax is unremarkable.  IMPRESSION:  Stable cardiomegaly. There is no evidence of CHF, pneumonia, nor other acute cardiopulmonary abnormality.   Electronically Signed   By: David  Martinique M.D.   On: 04/26/2015 12:00    Discharge Medications     Medication List    TAKE these medications        amiodarone 200 MG tablet  Commonly known as:  PACERONE  Take 1 tablet (200 mg total) by mouth every morning.     clonazePAM 1 MG tablet  Commonly known as:  KLONOPIN  Take 1 tablet (1 mg total) by mouth at bedtime.     cyanocobalamin 1000 MCG/ML injection  Commonly known as:  (VITAMIN B-12)  Inject 1 mL (1,000 mcg total) into the muscle as directed.     DIGESTIVE ENZYMES PO  Take 1 tablet by mouth 2 (two) times daily.     ferrous sulfate 325 (65 FE) MG tablet  Take 325 mg by mouth 2 (two) times daily with a meal.     finasteride 5 MG tablet  Commonly known as:  PROSCAR  Take 5 mg by mouth daily.     fish oil-omega-3 fatty acids 1000 MG capsule  Take 1 g by mouth 2 (two) times daily.     hydroxypropyl methylcellulose / hypromellose 2.5 % ophthalmic solution  Commonly known as:  ISOPTO TEARS / GONIOVISC  Place 1 drop into both eyes 3 (three) times daily as needed for dry eyes.     levothyroxine 50 MCG tablet  Commonly known as:  SYNTHROID, LEVOTHROID  Take 1 tablet (50 mcg total) by mouth daily.     multivitamin capsule  Take 1 capsule by mouth daily.     nitroGLYCERIN 0.4 MG SL tablet  Commonly known as:  NITROSTAT  Place 0.4 mg under the tongue every 5 (five) minutes as needed for chest pain.     pantoprazole 40 MG tablet  Commonly known as:  PROTONIX  Take 40 mg by mouth 2 (two) times daily.     pravastatin 40 MG tablet  Commonly known as:  PRAVACHOL  Take 40 mg by mouth every evening.     Syringe (Disposable) 1 ML Misc  1 each by Does not apply route as directed.     traZODone 50 MG tablet  Commonly known as:  DESYREL  Take 50-100 mg by mouth at bedtime as needed for sleep.     warfarin 5 MG tablet    Commonly known as:  COUMADIN  Take 1 tablet by mouth daily or as directed by coumadin clinic        Disposition   The patient will be discharged in stable condition to home.      Duration of Discharge Encounter: Greater than 30 minutes including physician and PA time.  SignedAngelena Form R PA-C 04/27/2015, 3:20 PM   Patient seen and examined.  Plan as discussed in my rounding note for today and outlined above. Minus Breeding  04/27/2015  3:26 PM

## 2015-04-27 NOTE — Transfer of Care (Signed)
Immediate Anesthesia Transfer of Care Note  Patient: [name]  Procedure(s) Performed: Procedure(s): CARDIOVERSION (N/A)  Patient Location: Endoscopy Unit  Anesthesia Type:General  Level of Consciousness: awake, alert  and oriented  Airway & Oxygen Therapy: Patient Spontanous Breathing and Patient connected to nasal cannula oxygen  Post-op Assessment: Report given to RN, Post -op Vital signs reviewed and stable and Patient moving all extremities X 4  Post vital signs: Reviewed and stable  Last Vitals:  Filed Vitals:   04/27/15 1301  BP:   Pulse: 60  Temp:   Resp: 16    Complications: No apparent anesthesia complications

## 2015-04-27 NOTE — Anesthesia Procedure Notes (Signed)
Procedure Name: MAC Date/Time: 04/27/2015 12:47 PM Performed by: Kyung Rudd Pre-anesthesia Checklist: Patient identified, Emergency Drugs available, Suction available, Patient being monitored and Timeout performed Patient Re-evaluated:Patient Re-evaluated prior to inductionOxygen Delivery Method: Ambu bag Preoxygenation: Pre-oxygenation with 100% oxygen Intubation Type: IV induction Ventilation: Mask ventilation without difficulty

## 2015-04-27 NOTE — Discharge Instructions (Signed)

## 2015-04-28 ENCOUNTER — Ambulatory Visit (INDEPENDENT_AMBULATORY_CARE_PROVIDER_SITE_OTHER): Payer: Medicare Other | Admitting: Internal Medicine

## 2015-04-28 ENCOUNTER — Encounter (HOSPITAL_COMMUNITY): Payer: Self-pay | Admitting: Cardiovascular Disease

## 2015-04-28 VITALS — BP 120/80 | HR 58 | Temp 97.9°F | Resp 20 | Ht 72.0 in | Wt 189.0 lb

## 2015-04-28 DIAGNOSIS — I2510 Atherosclerotic heart disease of native coronary artery without angina pectoris: Secondary | ICD-10-CM

## 2015-04-28 DIAGNOSIS — R5382 Chronic fatigue, unspecified: Secondary | ICD-10-CM | POA: Diagnosis not present

## 2015-04-28 DIAGNOSIS — I480 Paroxysmal atrial fibrillation: Secondary | ICD-10-CM

## 2015-04-28 DIAGNOSIS — I10 Essential (primary) hypertension: Secondary | ICD-10-CM

## 2015-04-28 NOTE — Progress Notes (Signed)
Pre visit review using our clinic review tool, if applicable. No additional management support is needed unless otherwise documented below in the visit note. 

## 2015-04-28 NOTE — Patient Instructions (Signed)
Limit your sodium (Salt) intake  Return in 6 months for follow-up  

## 2015-04-28 NOTE — Progress Notes (Signed)
CHADSVAC score of at least 4 (HTN 1, Age 81, CAD 1)

## 2015-04-28 NOTE — Progress Notes (Signed)
Subjective:    Patient ID: [name], male    DOB: 1927/07/04, 81 y.o.   MRN: [id number]  HPI  81 year old patient who is seen today for his six-month follow-up. He is followed closely by cardiology with coronary artery disease and atrial fibrillation.  He was recently discharged from the hospital yesterday evening after requiring DCCV for A. fib with uncontrolled ventricular response.  Since hospital discharge yesterday.  He has done fairly well.  He has essential hypertension.  Remains on Coumadin anticoagulation. He has a history of chronic fatigue and depression that seems fairly stable.  To be evaluated.  [place] for an eye exam  Past Medical History  Diagnosis Date  . HYPOTHYROIDISM   . HYPERLIPIDEMIA   . DEPRESSION   . HYPERTENSION   . CORONARY ARTERY DISEASE     a. s/p stent OM 2002. b. s/p BMS 2006 RCA  c. s/p cath 2007.  [name] GERD   . DIVERTICULOSIS, COLON   . BENIGN PROSTATIC HYPERTROPHY   . GYNECOMASTIA, UNILATERAL   . SLEEP APNEA   . Hematoma of abdominal wall   . Mallory - Weiss tear     a. > 5 years ago  . Barrett's esophagus   . Popliteal artery embolism, right ([place])   . History of echocardiogram     a. EF 55% (TEE 06/2014)  . Paroxysmal a-fib ([facility])     a. chronic amiodarone and coumadin;  b. 05/2013 s/p DCCV. c. 06/2014 s/p TEE/DCCV.  [name] Chronic anticoagulation     a. on Coumadin  . Mitral regurgitation     a. Mild-mod by TEE 06/2014.  [name] Anemia     Social History   Social History  . Marital Status: Married    Spouse Name: N/A  . Number of Children: 1  . Years of Education: N/A   Occupational History  . Retired    Social History Main Topics  . Smoking status: Never Smoker   . Smokeless tobacco: Never Used  . Alcohol Use: No  . Drug Use: No  . Sexual Activity: Not on file   Other Topics Concern  . Not on file   Social History Narrative   He is retired from [employer].   Mother lived into her 28's.  Father died in his  51's.  No CAD.    Past Surgical History  Procedure Laterality Date  . Transurethral resection of prostate    . Coronary angioplasty with stent placement    . Tee with cardioversion  7/08  . Cardioversion  8/08  . Esophagogastroduodenoscopy    . Tee without cardioversion  09/14/2011    Procedure: TRANSESOPHAGEAL ECHOCARDIOGRAM (TEE);  Surgeon: Josue Hector, MD;  Location: [place];  Service: Cardiovascular;  Laterality: N/A;  . Cardioversion  09/14/2011    Procedure: CARDIOVERSION;  Surgeon: Josue Hector, MD;  Location: [place];  Service: Cardiovascular;  Laterality: N/A;  . Cardioversion N/A 03/23/2014    Procedure: CARDIOVERSION;  Surgeon: Thompson Grayer, MD;  Location: [place];  Service: Cardiovascular;  Laterality: N/A;  . Tee without cardioversion N/A 06/22/2014    Procedure: TRANSESOPHAGEAL ECHOCARDIOGRAM (TEE);  Surgeon: [name], MD;  Location: [facility] ENDOSCOPY;  Service: Cardiovascular;  Laterality: N/A;  . Cardioversion N/A 06/22/2014    Procedure: CARDIOVERSION;  Surgeon: [name], MD;  Location: [place];  Service: Cardiovascular;  Laterality: N/A;  . Cardioversion N/A 04/27/2015    Procedure: CARDIOVERSION;  Surgeon: Josue Hector, MD;  Location: [facility] ENDOSCOPY;  Service: Cardiovascular;  Laterality: N/A;    Family History  Problem Relation Age of Onset  . Hypertension Other   . Heart disease Brother     CAD male 1st degree relative  . Heart attack Mother   . Stroke Neg Hx     Allergies  Allergen Reactions  . Metoprolol Tartrate Other (See Comments)    Anxiety, heart rate goes too low, feels like in a fog.    Current Outpatient Prescriptions on File Prior to Visit  Medication Sig Dispense Refill  . amiodarone (PACERONE) 200 MG tablet Take 1 tablet (200 mg total) by mouth every morning.    . clonazePAM (KLONOPIN) 1 MG tablet Take 1 tablet (1 mg total) by mouth at bedtime. 30 tablet 5  . cyanocobalamin (,VITAMIN B-12,) 1000 MCG/ML injection  Inject 1 mL (1,000 mcg total) into the muscle as directed. (Patient taking differently: Inject 1,000 mcg into the muscle every 30 (thirty) days. ) 30 mL 10  . DIGESTIVE ENZYMES PO Take 1 tablet by mouth 2 (two) times daily.     . ferrous sulfate 325 (65 FE) MG tablet Take 325 mg by mouth 2 (two) times daily with a meal.    . finasteride (PROSCAR) 5 MG tablet Take 5 mg by mouth daily.  11  . fish oil-omega-3 fatty acids 1000 MG capsule Take 1 g by mouth 2 (two) times daily.     . hydroxypropyl methylcellulose (ISOPTO TEARS) 2.5 % ophthalmic solution Place 1 drop into both eyes 3 (three) times daily as needed for dry eyes.     [name] levothyroxine (SYNTHROID, LEVOTHROID) 50 MCG tablet Take 1 tablet (50 mcg total) by mouth daily. 90 tablet 3  . Multiple Vitamin (MULTIVITAMIN) capsule Take 1 capsule by mouth daily.    . nitroGLYCERIN (NITROSTAT) 0.4 MG SL tablet Place 0.4 mg under the tongue every 5 (five) minutes as needed for chest pain.     . pantoprazole (PROTONIX) 40 MG tablet Take 40 mg by mouth 2 (two) times daily.     . pravastatin (PRAVACHOL) 40 MG tablet Take 40 mg by mouth every evening.    . Syringe, Disposable, 1 ML MISC 1 each by Does not apply route as directed. 60 each 3  . traZODone (DESYREL) 50 MG tablet Take 50-100 mg by mouth at bedtime as needed for sleep.    [name] warfarin (COUMADIN) 5 MG tablet Take 1 tablet by mouth daily or as directed by coumadin clinic 90 tablet 1   No current facility-administered medications on file prior to visit.    BP 120/80 mmHg  Pulse 58  Temp(Src) 97.9 F (36.6 C) (Oral)  Resp 20  Ht 6' (1.829 m)  Wt 189 lb (85.73 kg)  BMI 25.63 kg/m2  SpO2 98%     Review of Systems  Constitutional: Positive for fatigue. Negative for fever, chills and appetite change.  HENT: Negative for congestion, dental problem, ear pain, hearing loss, sore throat, tinnitus, trouble swallowing and voice change.   Eyes: Negative for pain, discharge and visual disturbance.    Respiratory: Negative for cough, chest tightness, wheezing and stridor.   Cardiovascular: Negative for chest pain, palpitations and leg swelling.  Gastrointestinal: Negative for nausea, vomiting, abdominal pain, diarrhea, constipation, blood in stool and abdominal distention.  Genitourinary: Negative for urgency, hematuria, flank pain, discharge, difficulty urinating and genital sores.  Musculoskeletal: Positive for gait problem. Negative for myalgias, back pain, joint swelling, arthralgias and neck stiffness.  Skin: Negative  for rash.  Neurological: Positive for weakness. Negative for dizziness, syncope, speech difficulty, numbness and headaches.  Hematological: Negative for adenopathy. Does not bruise/bleed easily.  Psychiatric/Behavioral: Negative for behavioral problems and dysphoric mood. The patient is not nervous/anxious.        Objective:   Physical Exam  Constitutional: He is oriented to person, place, and time. He appears well-developed.  Blood pressure 120/78  HENT:  Head: Normocephalic.  Right Ear: External ear normal.  Left Ear: External ear normal.  Eyes: Conjunctivae and EOM are normal.  Neck: Normal range of motion.  Cardiovascular: Normal rate, regular rhythm and normal heart sounds.   Pulmonary/Chest: Breath sounds normal.  Abdominal: Bowel sounds are normal.  Musculoskeletal: Normal range of motion. He exhibits no edema or tenderness.  Neurological: He is alert and oriented to person, place, and time.  Psychiatric: He has a normal mood and affect. His behavior is normal.          Assessment & Plan:   Paroxysmal atrial fibrillation status post recent DCCV Essential hypertension, stable Coronary artery disease, stable History depression, stable Hypothyroidism. Chronic Coumadin anticoagulation  No change in medical regimen Follow-up cardiology Recheck here in 6 months

## 2015-05-18 ENCOUNTER — Ambulatory Visit (INDEPENDENT_AMBULATORY_CARE_PROVIDER_SITE_OTHER): Payer: Medicare Other | Admitting: Cardiology

## 2015-05-18 ENCOUNTER — Encounter: Payer: Self-pay | Admitting: Cardiology

## 2015-05-18 ENCOUNTER — Ambulatory Visit (INDEPENDENT_AMBULATORY_CARE_PROVIDER_SITE_OTHER): Payer: Medicare Other | Admitting: Pharmacist Clinician (PhC)/ Clinical Pharmacy Specialist

## 2015-05-18 VITALS — BP 128/80 | HR 54 | Ht 71.0 in | Wt 190.2 lb

## 2015-05-18 DIAGNOSIS — I2510 Atherosclerotic heart disease of native coronary artery without angina pectoris: Secondary | ICD-10-CM

## 2015-05-18 DIAGNOSIS — I10 Essential (primary) hypertension: Secondary | ICD-10-CM | POA: Diagnosis not present

## 2015-05-18 DIAGNOSIS — Z5181 Encounter for therapeutic drug level monitoring: Secondary | ICD-10-CM

## 2015-05-18 DIAGNOSIS — I4891 Unspecified atrial fibrillation: Secondary | ICD-10-CM

## 2015-05-18 DIAGNOSIS — Z7901 Long term (current) use of anticoagulants: Secondary | ICD-10-CM

## 2015-05-18 LAB — POCT INR: INR: 2.9

## 2015-05-18 NOTE — Progress Notes (Signed)
HPI The patient presents for followup after recent hospitalization for atrial fibrillation.   He fell and injured his back and then subsequently had atrial fibrillation requiring cardioversion. He had a short course of increased amiodarone. This is easily cardioversion. He has gone for about a year at a time between bouts of persistent fibrillation.  He has been sensitive to multiple medications. He gets chest pain about once a week which is a stable pattern. He has chronic fatigue. He has chronic dyspnea. However, he has no increased episodes of these.  Allergies  Allergen Reactions  . Metoprolol Tartrate Other (See Comments)    Anxiety, heart rate goes too low, feels like in a fog.    Current Outpatient Prescriptions  Medication Sig Dispense Refill  . amiodarone (PACERONE) 200 MG tablet Take 1 tablet (200 mg total) by mouth every morning.    . clonazePAM (KLONOPIN) 1 MG tablet Take 1 tablet (1 mg total) by mouth at bedtime. 30 tablet 5  . cyanocobalamin (,VITAMIN B-12,) 1000 MCG/ML injection Inject 1 mL (1,000 mcg total) into the muscle as directed. (Patient taking differently: Inject 1,000 mcg into the muscle every 30 (thirty) days. ) 30 mL 10  . DIGESTIVE ENZYMES PO Take 1 tablet by mouth 2 (two) times daily.     . ferrous sulfate 325 (65 FE) MG tablet Take 325 mg by mouth 2 (two) times daily with a meal.    . finasteride (PROSCAR) 5 MG tablet Take 5 mg by mouth daily.  11  . fish oil-omega-3 fatty acids 1000 MG capsule Take 1 g by mouth 2 (two) times daily.     . hydroxypropyl methylcellulose (ISOPTO TEARS) 2.5 % ophthalmic solution Place 1 drop into both eyes 3 (three) times daily as needed for dry eyes.     [name] levothyroxine (SYNTHROID, LEVOTHROID) 50 MCG tablet Take 1 tablet (50 mcg total) by mouth daily. 90 tablet 3  . Multiple Vitamin (MULTIVITAMIN) capsule Take 1 capsule by mouth daily.    . nitroGLYCERIN (NITROSTAT) 0.4 MG SL tablet Place 0.4 mg under the tongue every 5 (five)  minutes as needed for chest pain.     . pantoprazole (PROTONIX) 40 MG tablet Take 40 mg by mouth 2 (two) times daily.     . pravastatin (PRAVACHOL) 40 MG tablet Take 40 mg by mouth every evening.    . Syringe, Disposable, 1 ML MISC 1 each by Does not apply route as directed. 60 each 3  . traZODone (DESYREL) 50 MG tablet Take 50-100 mg by mouth at bedtime as needed for sleep.    [name] warfarin (COUMADIN) 5 MG tablet Take 1 tablet by mouth daily or as directed by coumadin clinic 90 tablet 1   No current facility-administered medications for this visit.    Past Medical History  Diagnosis Date  . HYPOTHYROIDISM   . HYPERLIPIDEMIA   . DEPRESSION   . HYPERTENSION   . CORONARY ARTERY DISEASE     a. s/p stent OM 2002. b. s/p BMS 2006 RCA  c. s/p cath 2007.  [name] GERD   . DIVERTICULOSIS, COLON   . BENIGN PROSTATIC HYPERTROPHY   . GYNECOMASTIA, UNILATERAL   . SLEEP APNEA   . Hematoma of abdominal wall   . [place] - Weiss tear     a. > 5 years ago  . Barrett's esophagus   . Popliteal artery embolism, right ([place])   . History of echocardiogram     a. EF 55% (TEE 06/2014)  . Paroxysmal  a-fib ([place])     a. chronic amiodarone and coumadin;  b. 05/2013 s/p DCCV. c. 06/2014 s/p TEE/DCCV.  [name] Chronic anticoagulation     a. on Coumadin  . Mitral regurgitation     a. Mild-mod by TEE 06/2014.  [name] Anemia     Past Surgical History  Procedure Laterality Date  . Transurethral resection of prostate    . Coronary angioplasty with stent placement    . Tee with cardioversion  7/08  . Cardioversion  8/08  . Esophagogastroduodenoscopy    . Tee without cardioversion  09/14/2011    Procedure: TRANSESOPHAGEAL ECHOCARDIOGRAM (TEE);  Surgeon: Josue Hector, MD;  Location: [place];  Service: Cardiovascular;  Laterality: N/A;  . Cardioversion  09/14/2011    Procedure: CARDIOVERSION;  Surgeon: Josue Hector, MD;  Location: [place];  Service: Cardiovascular;  Laterality: N/A;  . Cardioversion N/A 03/23/2014      Procedure: CARDIOVERSION;  Surgeon: Thompson Grayer, MD;  Location: [place];  Service: Cardiovascular;  Laterality: N/A;  . Tee without cardioversion N/A 06/22/2014    Procedure: TRANSESOPHAGEAL ECHOCARDIOGRAM (TEE);  Surgeon: [name], MD;  Location: [place];  Service: Cardiovascular;  Laterality: N/A;  . Cardioversion N/A 06/22/2014    Procedure: CARDIOVERSION;  Surgeon: [name], MD;  Location: [place];  Service: Cardiovascular;  Laterality: N/A;  . Cardioversion N/A 04/27/2015    Procedure: CARDIOVERSION;  Surgeon: Josue Hector, MD;  Location: [facility] ENDOSCOPY;  Service: Cardiovascular;  Laterality: N/A;    ROS:  As stated in the HPI and negative for all other systems.  PHYSICAL EXAM BP 128/80 mmHg  Pulse 54  Ht 5\' 11"  (1.803 m)  Wt 190 lb 3.2 oz (86.274 kg)  BMI 26.54 kg/m2 GENERAL: No acute distress NECK:  No jugular venous distention, waveform within normal limits, carotid upstroke brisk and symmetric, no bruits, no thyromegaly LYMPHATICS:  No cervical, inguinal adenopathy LUNGS:  Clear to auscultation bilaterally BACK:  No CVA tenderness CHEST:  Unremarkable HEART:  PMI not displaced or sustained,S1 and S2 within normal limits, no S3, no S4, no clicks, no rubs, apical and axillary late systolic murmur ABD:  Flat, positive bowel sounds normal in frequency in pitch, no bruits, no rebound, no guarding, no midline pulsatile mass, no hepatomegaly, no splenomegaly EXT:  2 plus pulses throughout, no edema, no cyanosis no clubbing  EKG:  Sinus rhythm, rate 54, left axis deviation, incomplete right bundle branch block, no acute ST-T wave changes.  05/18/2015   ASSESSMENT AND PLAN  ATRIAL FIBRILLATION - He is maintaining NSR after this most recent cardioversion.  Continue amiodarone.    BETA BLOCKER INTOLERANCE - This was added to his list of allergies.  CORONARY ARTERY DISEASE -  The patient has no new sypmtoms.  No further cardiovascular testing is  indicated.  We will continue with aggressive risk reduction and meds as listed.  He has a stable infrequent anginal pattern.  HYPERTENSION -  The blood pressure is at target. No change in medications is indicated. We will continue with therapeutic lifestyle changes (TLC).    MITRAL REGURGITATION -  This was moderate on the last echo and we will follow this clinically.   FATIGUE - Unchanged  GUAIAC POSITIVE STOOL - He was not anemic. He has small hemorrhoids. This can be followed by his primary physician.

## 2015-05-18 NOTE — Patient Instructions (Signed)
Your physician wants you to follow-up in: 2 Months You will receive a reminder letter in the mail two months in advance. If you don't receive a letter, please call our office to schedule the follow-up appointment.  

## 2015-05-19 ENCOUNTER — Ambulatory Visit: Payer: Medicare Other | Admitting: Pharmacist Clinician (PhC)/ Clinical Pharmacy Specialist

## 2015-05-19 DIAGNOSIS — R3912 Poor urinary stream: Secondary | ICD-10-CM | POA: Diagnosis not present

## 2015-05-19 DIAGNOSIS — N401 Enlarged prostate with lower urinary tract symptoms: Secondary | ICD-10-CM | POA: Diagnosis not present

## 2015-06-16 ENCOUNTER — Ambulatory Visit (INDEPENDENT_AMBULATORY_CARE_PROVIDER_SITE_OTHER): Payer: Medicare Other | Admitting: Pharmacist Clinician (PhC)/ Clinical Pharmacy Specialist

## 2015-06-16 ENCOUNTER — Ambulatory Visit: Payer: Medicare Other | Admitting: Pharmacist Clinician (PhC)/ Clinical Pharmacy Specialist

## 2015-06-16 DIAGNOSIS — I4891 Unspecified atrial fibrillation: Secondary | ICD-10-CM

## 2015-06-16 DIAGNOSIS — Z7901 Long term (current) use of anticoagulants: Secondary | ICD-10-CM

## 2015-06-16 DIAGNOSIS — Z5181 Encounter for therapeutic drug level monitoring: Secondary | ICD-10-CM | POA: Diagnosis not present

## 2015-06-16 LAB — POCT INR: INR: 2.2

## 2015-06-16 MED ORDER — WARFARIN SODIUM 5 MG PO TABS: ORAL_TABLET | ORAL | Status: DC

## 2015-06-17 ENCOUNTER — Telehealth: Payer: Self-pay | Admitting: Pharmacist Clinician (PhC)/ Clinical Pharmacy Specialist

## 2015-06-17 NOTE — Telephone Encounter (Signed)
[facility].  They are asking to hold all blood thinners for 2 weeks prior.  We do not routinely hold warfarin for cataract procedures.   Letter created

## 2015-06-18 ENCOUNTER — Ambulatory Visit (INDEPENDENT_AMBULATORY_CARE_PROVIDER_SITE_OTHER): Payer: Medicare Other | Admitting: Family Medicine

## 2015-06-18 VITALS — BP 124/80 | HR 69 | Temp 98.1°F | Ht 72.0 in | Wt 195.2 lb

## 2015-06-18 DIAGNOSIS — J069 Acute upper respiratory infection, unspecified: Secondary | ICD-10-CM | POA: Diagnosis not present

## 2015-06-18 DIAGNOSIS — I2510 Atherosclerotic heart disease of native coronary artery without angina pectoris: Secondary | ICD-10-CM | POA: Diagnosis not present

## 2015-06-18 NOTE — Progress Notes (Signed)
Pre visit review using our clinic review tool, if applicable. No additional management support is needed unless otherwise documented below in the visit note. 

## 2015-06-18 NOTE — Patient Instructions (Signed)

## 2015-06-18 NOTE — Progress Notes (Signed)
HPI:  URI: -started: about 4 days ago - feeling better today -symptoms:nasal congestion, sore throat, cough -denies:fever, SOB, NVD, tooth pain, sinus pain, cp, orthpnea -has tried: nothing -sick contacts/travel/risks: denies flu exposure, tick exposure or or Ebola risks  ROS: See pertinent positives and negatives per HPI.  Past Medical History  Diagnosis Date  . HYPOTHYROIDISM   . HYPERLIPIDEMIA   . DEPRESSION   . HYPERTENSION   . CORONARY ARTERY DISEASE     a. s/p stent OM 2002. b. s/p BMS 2006 RCA  c. s/p cath 2007.  [name] GERD   . DIVERTICULOSIS, COLON   . BENIGN PROSTATIC HYPERTROPHY   . GYNECOMASTIA, UNILATERAL   . SLEEP APNEA   . Hematoma of abdominal wall   . Mallory - Weiss tear     a. > 5 years ago  . Barrett's esophagus   . Popliteal artery embolism, right ([place])   . History of echocardiogram     a. EF 55% (TEE 06/2014)  . Paroxysmal a-fib (HCC)     a. chronic amiodarone and coumadin;  b. 05/2013 s/p DCCV. c. 06/2014 s/p TEE/DCCV.  [name] Chronic anticoagulation     a. on Coumadin  . Mitral regurgitation     a. Mild-mod by TEE 06/2014.  [name] Anemia     Past Surgical History  Procedure Laterality Date  . Transurethral resection of prostate    . Coronary angioplasty with stent placement    . Tee with cardioversion  7/08  . Cardioversion  8/08  . Esophagogastroduodenoscopy    . Tee without cardioversion  09/14/2011    Procedure: TRANSESOPHAGEAL ECHOCARDIOGRAM (TEE);  Surgeon: Josue Hector, MD;  Location: [place];  Service: Cardiovascular;  Laterality: N/A;  . Cardioversion  09/14/2011    Procedure: CARDIOVERSION;  Surgeon: Josue Hector, MD;  Location: [place];  Service: Cardiovascular;  Laterality: N/A;  . Cardioversion N/A 03/23/2014    Procedure: CARDIOVERSION;  Surgeon: Thompson Grayer, MD;  Location: [place];  Service: Cardiovascular;  Laterality: N/A;  . Tee without cardioversion N/A 06/22/2014    Procedure: TRANSESOPHAGEAL ECHOCARDIOGRAM (TEE);  Surgeon:  [name], MD;  Location: [facility] ENDOSCOPY;  Service: Cardiovascular;  Laterality: N/A;  . Cardioversion N/A 06/22/2014    Procedure: CARDIOVERSION;  Surgeon: [name], MD;  Location: [place];  Service: Cardiovascular;  Laterality: N/A;  . Cardioversion N/A 04/27/2015    Procedure: CARDIOVERSION;  Surgeon: Josue Hector, MD;  Location: [facility] ENDOSCOPY;  Service: Cardiovascular;  Laterality: N/A;    Family History  Problem Relation Age of Onset  . Hypertension Other   . Heart disease Brother     CAD male 1st degree relative  . Heart attack Mother   . Stroke Neg Hx     Social History   Social History  . Marital Status: Married    Spouse Name: N/A  . Number of Children: 1  . Years of Education: N/A   Occupational History  . Retired    Social History Main Topics  . Smoking status: Never Smoker   . Smokeless tobacco: Never Used  . Alcohol Use: No  . Drug Use: No  . Sexual Activity: Not on file   Other Topics Concern  . Not on file   Social History Narrative   He is retired from Landscape [employer].   Mother lived into her 36's.  Father died in his 61's.  No CAD.     Current outpatient prescriptions:  .  amiodarone (PACERONE) 200  MG tablet, Take 1 tablet (200 mg total) by mouth every morning., Disp: , Rfl:  .  clonazePAM (KLONOPIN) 1 MG tablet, Take 1 tablet (1 mg total) by mouth at bedtime., Disp: 30 tablet, Rfl: 5 .  cyanocobalamin (,VITAMIN B-12,) 1000 MCG/ML injection, Inject 1 mL (1,000 mcg total) into the muscle as directed. (Patient taking differently: Inject 1,000 mcg into the muscle every 30 (thirty) days. ), Disp: 30 mL, Rfl: 10 .  DIGESTIVE ENZYMES PO, Take 1 tablet by mouth 2 (two) times daily. , Disp: , Rfl:  .  ferrous sulfate 325 (65 FE) MG tablet, Take 325 mg by mouth 2 (two) times daily with a meal., Disp: , Rfl:  .  finasteride (PROSCAR) 5 MG tablet, Take 5 mg by mouth daily., Disp: , Rfl: 11 .  fish oil-omega-3 fatty acids 1000 MG capsule, Take  1 g by mouth 2 (two) times daily. , Disp: , Rfl:  .  hydroxypropyl methylcellulose (ISOPTO TEARS) 2.5 % ophthalmic solution, Place 1 drop into both eyes 3 (three) times daily as needed for dry eyes. , Disp: , Rfl:  .  levothyroxine (SYNTHROID, LEVOTHROID) 50 MCG tablet, Take 1 tablet (50 mcg total) by mouth daily., Disp: 90 tablet, Rfl: 3 .  Multiple Vitamin (MULTIVITAMIN) capsule, Take 1 capsule by mouth daily., Disp: , Rfl:  .  nitroGLYCERIN (NITROSTAT) 0.4 MG SL tablet, Place 0.4 mg under the tongue every 5 (five) minutes as needed for chest pain. , Disp: , Rfl:  .  pantoprazole (PROTONIX) 40 MG tablet, Take 40 mg by mouth 2 (two) times daily. , Disp: , Rfl:  .  pravastatin (PRAVACHOL) 40 MG tablet, Take 40 mg by mouth every evening., Disp: , Rfl:  .  Syringe, Disposable, 1 ML MISC, 1 each by Does not apply route as directed., Disp: 60 each, Rfl: 3 .  traZODone (DESYREL) 50 MG tablet, Take 50-100 mg by mouth at bedtime as needed for sleep., Disp: , Rfl:  .  warfarin (COUMADIN) 5 MG tablet, Take 1 tablet by mouth daily or as directed by coumadin clinic, Disp: 90 tablet, Rfl: 1  EXAM:  Filed Vitals:   06/18/15 1339  BP: 124/80  Pulse: 69  Temp: 98.1 F (36.7 C)    Body mass index is 26.47 kg/(m^2).  GENERAL: vitals reviewed and listed above, alert, oriented, appears well hydrated and in no acute distress  HEENT: atraumatic, conjunttiva clear, no obvious abnormalities on inspection of external nose and ears, normal appearance of ear canals and TMs, clear nasal congestion, mild post oropharyngeal erythema with PND, no tonsillar edema or exudate, no sinus TTP  NECK: no obvious masses on inspection  LUNGS: clear to auscultation bilaterally, no wheezes, rales or rhonchi, good air movement  CV: HRRR, no peripheral edema  MS: moves all extremities without noticeable abnormality  PSYCH: pleasant and cooperative, no obvious depression or anxiety  ASSESSMENT AND PLAN:  Discussed the  following assessment and plan:  Acute upper respiratory infection  -given HPI and exam findings today, a serious infection or illness is unlikely. We discussed potential etiologies, with VURI being most likely, and advised supportive care and monitoring. We discussed treatment side effects, likely course, antibiotic misuse, transmission, and signs of developing a serious illness. -of course, we advised to return or notify a doctor immediately if symptoms worsen or persist or new concerns arise.    Patient Instructions  INSTRUCTIONS FOR UPPER RESPIRATORY INFECTION:  -plenty of rest and fluids  -nasal saline wash 2-3 times  daily (use prepackaged nasal saline or bottled/distilled water if making your own)   -can use AFRIN nasal spray for drainage and nasal congestion - but do NOT use longer then 3-4 days  -can use tylenol (in no history of liver disease) as directed for aches and sorethroat  -in the winter time, using a humidifier at night is helpful (please follow cleaning instructions)  -if you are taking a cough medication - use only as directed, may also try a teaspoon of honey to coat the throat and throat lozenges. If given a cough medication with codeine or hydrocodone or other narcotic please be advised that this contains a strong and  potentially addicting medication. Please follow instructions carefully, take as little as possible and only use AS NEEDED for severe cough. Discuss potential side effects with your pharmacy. Please do not drive or operate machinery while taking these types of medications. Please do not take other sedating medications, drugs or alcohol while taking this medication without discussing with your doctor.  -for sore throat, salt water gargles can help  -follow up if you have fevers, facial pain, tooth pain, difficulty breathing or are worsening or symptoms persist longer then expected  Upper Respiratory Infection, Adult An upper respiratory infection (URI)  is also known as the common cold. It is often caused by a type of germ (virus). Colds are easily spread (contagious). You can pass it to others by kissing, coughing, sneezing, or drinking out of the same glass. Usually, you get better in 1 to 3  weeks.  However, the cough can last for even longer. HOME CARE   Only take medicine as told by your doctor. Follow instructions provided above.  Drink enough water and fluids to keep your pee (urine) clear or pale yellow.  Get plenty of rest.  Return to work when your temperature is < 100 for 24 hours or as told by your doctor. You may use a face mask and wash your hands to stop your cold from spreading. GET HELP RIGHT AWAY IF:   After the first few days, you feel you are getting worse.  You have questions about your medicine.  You have chills, shortness of breath, or red spit (mucus).  You have pain in the face for more then 1-2 days, especially when you bend forward.  You have a fever, puffy (swollen) neck, pain when you swallow, or white spots in the back of your throat.  You have a bad headache, ear pain, sinus pain, or chest pain.  You have a high-pitched whistling sound when you breathe in and out (wheezing).  You cough up blood.  You have sore muscles or a stiff neck. MAKE SURE YOU:   Understand these instructions.  Will watch your condition.  Will get help right away if you are not doing well or get worse. Document Released: 12/20/2007 Document Revised: 09/25/2011 Document Reviewed: 10/08/2013 [facility] Patient Information 2015 [place], [place]. This information is not intended to replace advice given to you by your health care provider. Make sure you discuss any questions you have with your health care provider.      Colin Benton R.

## 2015-07-13 ENCOUNTER — Encounter: Payer: Medicare Other | Admitting: Pharmacist Clinician (PhC)/ Clinical Pharmacy Specialist

## 2015-07-14 ENCOUNTER — Other Ambulatory Visit (HOSPITAL_COMMUNITY): Payer: Self-pay

## 2015-07-14 ENCOUNTER — Ambulatory Visit (HOSPITAL_COMMUNITY)
Admission: RE | Admit: 2015-07-14 | Discharge: 2015-07-14 | Disposition: A | Discharge status: Home / Self Care | Payer: Medicare Other | Source: Ambulatory Visit | Attending: Nurse Practitioner | Admitting: Nurse Practitioner

## 2015-07-14 ENCOUNTER — Ambulatory Visit (INDEPENDENT_AMBULATORY_CARE_PROVIDER_SITE_OTHER): Payer: Medicare Other

## 2015-07-14 ENCOUNTER — Ambulatory Visit (INDEPENDENT_AMBULATORY_CARE_PROVIDER_SITE_OTHER): Payer: Medicare Other | Admitting: Pharmacist Clinician (PhC)/ Clinical Pharmacy Specialist

## 2015-07-14 VITALS — BP 128/79 | HR 109

## 2015-07-14 VITALS — BP 120/70 | HR 127 | Ht 71.0 in | Wt 191.4 lb

## 2015-07-14 DIAGNOSIS — Z5181 Encounter for therapeutic drug level monitoring: Secondary | ICD-10-CM | POA: Diagnosis not present

## 2015-07-14 DIAGNOSIS — I4819 Other persistent atrial fibrillation: Secondary | ICD-10-CM

## 2015-07-14 DIAGNOSIS — Z7901 Long term (current) use of anticoagulants: Secondary | ICD-10-CM

## 2015-07-14 DIAGNOSIS — I2510 Atherosclerotic heart disease of native coronary artery without angina pectoris: Secondary | ICD-10-CM

## 2015-07-14 DIAGNOSIS — I481 Persistent atrial fibrillation: Secondary | ICD-10-CM

## 2015-07-14 DIAGNOSIS — I4891 Unspecified atrial fibrillation: Secondary | ICD-10-CM | POA: Diagnosis not present

## 2015-07-14 DIAGNOSIS — I10 Essential (primary) hypertension: Secondary | ICD-10-CM | POA: Diagnosis not present

## 2015-07-14 LAB — BASIC METABOLIC PANEL
ANION GAP: 7 (ref 5–15)
BUN: 20 mg/dL (ref 6–20)
CALCIUM: 8.9 mg/dL (ref 8.9–10.3)
CO2: 25 mmol/L (ref 22–32)
Chloride: 109 mmol/L (ref 101–111)
Creatinine, Ser: 1.2 mg/dL (ref 0.61–1.24)
GFR calc Af Amer: >60 mL/min (ref >60)
GFR calc non Af Amer: 53 mL/min — ABNORMAL LOW (ref >60)
GLUCOSE: 106 mg/dL — AB (ref 65–99)
Potassium: 4.2 mmol/L (ref 3.5–5.1)
Sodium: 141 mmol/L (ref 135–145)

## 2015-07-14 LAB — CBC
HCT: 39.8 % (ref 39.0–52.0)
Hemoglobin: 13.1 g/dL (ref 13.0–17.0)
MCH: 32.5 pg (ref 26.0–34.0)
MCHC: 32.9 g/dL (ref 30.0–36.0)
MCV: 98.8 fL (ref 78.0–100.0)
PLATELETS: 240 10*3/uL (ref 150–400)
RBC: 4.03 MIL/uL — ABNORMAL LOW (ref 4.22–5.81)
RDW: 14.1 % (ref 11.5–15.5)
WBC: 6.2 10*3/uL (ref 4.0–10.5)

## 2015-07-14 LAB — POCT INR: INR: 3.8

## 2015-07-14 NOTE — Patient Instructions (Signed)
Take and another dose of Amiodarone 200 mg today   Appointment made in [facility] today at 3pm.    Keep scheduled 07/29/2015 Dr. Percival Spanish appointment

## 2015-07-14 NOTE — Patient Instructions (Signed)
Scheduled for Cardioversion on 07/16/15 @ 2:00pm   -Arrive at the [facility] entrance and go to admitting.  -Do not eat or drink anything after midnight the prior to your procedure.  -Take all of your medications with a sip of water prior to arrival.  - You will not be able to drive home after procedure, make sure you have a driver.

## 2015-07-14 NOTE — Progress Notes (Signed)
Pt in today for coumadin check and stated he is out of rhythm and wants and EKG.  Pt stated he has been SOB, dizzy, and weak since this started.  EKG performed, pt in AFib HR 109. Pt stated that at home HR been 115.   BP:128/79 Pulse Ox 97% at [facility] Pt has not missed any doses of medication and had cardioversion in Oct. This is first time being back in A. Fib since the procedure.  Pt EKG reviewed by Pa, R. Barrett: orders to take an extra dose of Amiodarone today and pt scheduled to see D. Carroll in [facility]. Fib clinic today at 3pm.  Pt agreed with orders and aware he is also to keep his appt with Dr. Percival Spanish on 1/12.  Pt provided with directions and garage code for [facility] appt.  Pt aware if gets worse before appt to go to the emergency room.

## 2015-07-14 NOTE — Telephone Encounter (Signed)
Pt in office today for a coumadin check and stated for the past week he things he has been out of rhythm. Pt has experienced increased SOB, weakness, and some dizziness. Pt stated that HR at home has been 115.   EKG This encounter was created in error - please disregard.

## 2015-07-15 ENCOUNTER — Encounter (HOSPITAL_COMMUNITY): Payer: Self-pay | Admitting: Nurse Practitioner

## 2015-07-15 ENCOUNTER — Other Ambulatory Visit (HOSPITAL_COMMUNITY): Payer: Self-pay | Admitting: Nurse Practitioner

## 2015-07-15 NOTE — Progress Notes (Signed)
Patient ID: [name], male   DOB: Sep 25, 1926, 81 y.o.   MRN: [id number]     Primary Care Physician: Nyoka Cowden, MD Referring Physician: [facility]   [name] is a 81 y.o. male with a h/o PAF that was in the coumadin clinic earlier today and mentioned that he had felt weak x 2 weeks and thought he was back in afib. Ekg confirmed that he had afib with RVR.He was referred to the afib clinic for further evaluation. He is on amiodarone and was told to take an extra dose today. His INR's have been therapeutic. INR today at 3.8 and he was instructed to hold coumadin x one night. He does not have any signs of heart failure, just does not have his usual energy in afib. He had a cardioversion in October and states 5 other cardioversion's over the years. The wife and pt feel that the stress of his recent cataract surgery, staying with his son and one fall due to unfamiliar surroundings in the night may had contributed to most recent episode afib. He does have an intolerance to BB's.  Today, he denies symptoms of palpitations, chest pain, shortness of breath, orthopnea, PND, lower extremity edema, dizziness, presyncope, syncope, or neurologic sequela. The patient is tolerating medications without difficulties and is otherwise without complaint today.   Past Medical History  Diagnosis Date  . HYPOTHYROIDISM   . HYPERLIPIDEMIA   . DEPRESSION   . HYPERTENSION   . CORONARY ARTERY DISEASE     a. s/p stent OM 2002. b. s/p BMS 2006 RCA  c. s/p cath 2007.  [name] GERD   . DIVERTICULOSIS, COLON   . BENIGN PROSTATIC HYPERTROPHY   . GYNECOMASTIA, UNILATERAL   . SLEEP APNEA   . Hematoma of abdominal wall   . Mallory - Weiss tear     a. > 5 years ago  . Barrett's esophagus   . Popliteal artery embolism, right ([place])   . History of echocardiogram     a. EF 55% (TEE 06/2014)  . Paroxysmal a-fib (HCC)     a. chronic amiodarone and coumadin;  b. 05/2013 s/p DCCV. c. 06/2014 s/p  TEE/DCCV.  [name] Chronic anticoagulation     a. on Coumadin  . Mitral regurgitation     a. Mild-mod by TEE 06/2014.  [name] Anemia    Past Surgical History  Procedure Laterality Date  . Transurethral resection of prostate    . Coronary angioplasty with stent placement    . Tee with cardioversion  7/08  . Cardioversion  8/08  . Esophagogastroduodenoscopy    . Tee without cardioversion  09/14/2011    Procedure: TRANSESOPHAGEAL ECHOCARDIOGRAM (TEE);  Surgeon: Josue Hector, MD;  Location: [place];  Service: Cardiovascular;  Laterality: N/A;  . Cardioversion  09/14/2011    Procedure: CARDIOVERSION;  Surgeon: Josue Hector, MD;  Location: [place];  Service: Cardiovascular;  Laterality: N/A;  . Cardioversion N/A 03/23/2014    Procedure: CARDIOVERSION;  Surgeon: Thompson Grayer, MD;  Location: [place];  Service: Cardiovascular;  Laterality: N/A;  . Tee without cardioversion N/A 06/22/2014    Procedure: TRANSESOPHAGEAL ECHOCARDIOGRAM (TEE);  Surgeon: [name], MD;  Location: [facility] ENDOSCOPY;  Service: Cardiovascular;  Laterality: N/A;  . Cardioversion N/A 06/22/2014    Procedure: CARDIOVERSION;  Surgeon: [name], MD;  Location: [place];  Service: Cardiovascular;  Laterality: N/A;  . Cardioversion N/A 04/27/2015    Procedure: CARDIOVERSION;  Surgeon: Josue Hector, MD;  Location: [facility] ENDOSCOPY;  Service: Cardiovascular;  Laterality: N/A;    Current Outpatient Prescriptions  Medication Sig Dispense Refill  . amiodarone (PACERONE) 200 MG tablet Take 1 tablet (200 mg total) by mouth every morning.    . clonazePAM (KLONOPIN) 1 MG tablet Take 1 tablet (1 mg total) by mouth at bedtime. 30 tablet 5  . cyanocobalamin (,VITAMIN B-12,) 1000 MCG/ML injection Inject 1 mL (1,000 mcg total) into the muscle as directed. (Patient taking differently: Inject 1,000 mcg into the muscle every 30 (thirty) days. ) 30 mL 10  . DIGESTIVE ENZYMES PO Take 1 tablet by mouth 2 (two) times daily.     . ferrous  sulfate 325 (65 FE) MG tablet Take 325 mg by mouth 2 (two) times daily with a meal.    . finasteride (PROSCAR) 5 MG tablet Take 5 mg by mouth daily.  11  . fish oil-omega-3 fatty acids 1000 MG capsule Take 1 g by mouth 2 (two) times daily.     . hydroxypropyl methylcellulose (ISOPTO TEARS) 2.5 % ophthalmic solution Place 1 drop into both eyes 3 (three) times daily as needed for dry eyes.     [name] levothyroxine (SYNTHROID, LEVOTHROID) 50 MCG tablet Take 1 tablet (50 mcg total) by mouth daily. 90 tablet 3  . Multiple Vitamin (MULTIVITAMIN) capsule Take 1 capsule by mouth daily.    . nitroGLYCERIN (NITROSTAT) 0.4 MG SL tablet Place 0.4 mg under the tongue every 5 (five) minutes as needed for chest pain.     . pantoprazole (PROTONIX) 40 MG tablet Take 40 mg by mouth 2 (two) times daily.     . pravastatin (PRAVACHOL) 40 MG tablet Take 40 mg by mouth every evening.    . Syringe, Disposable, 1 ML MISC 1 each by Does not apply route as directed. 60 each 3  . traZODone (DESYREL) 50 MG tablet Take 50-100 mg by mouth at bedtime as needed for sleep.    [name] warfarin (COUMADIN) 5 MG tablet Take 1 tablet by mouth daily or as directed by coumadin clinic 90 tablet 1   No current facility-administered medications for this encounter.    Allergies  Allergen Reactions  . Metoprolol Tartrate Other (See Comments)    Anxiety, heart rate goes too low, feels like in a fog.    Social History   Social History  . Marital Status: Married    Spouse Name: N/A  . Number of Children: 1  . Years of Education: N/A   Occupational History  . Retired    Social History Main Topics  . Smoking status: Never Smoker   . Smokeless tobacco: Never Used  . Alcohol Use: No  . Drug Use: No  . Sexual Activity: Not on file   Other Topics Concern  . Not on file   Social History Narrative   He is retired from Landscape architect.   Mother lived into her 16's.  Father died in his 42's.  No CAD.    Family History  Problem Relation  Age of Onset  . Hypertension Other   . Heart disease Brother     CAD male 1st degree relative  . Heart attack Mother   . Stroke Neg Hx     ROS- All systems are reviewed and negative except as per the HPI above  Physical Exam: Filed Vitals:   07/14/15 1518  BP: 120/70  Pulse: 127  Height: 5\' 11"  (1.803 m)  Weight: 191 lb 6.4 oz (86.818 kg)    GEN-  The patient is well appearing, alert and oriented x 3 today.   Head- normocephalic, atraumatic Eyes-  Sclera clear, conjunctiva pink Ears- hearing intact Oropharynx- clear Neck- supple, no JVP Lymph- no cervical lymphadenopathy Lungs- Clear to ausculation bilaterally, normal work of breathing Heart- Rapid irregular rate and rhythm, no murmurs, rubs or gallops, PMI not laterally displaced GI- soft, NT, ND, + BS Extremities- no clubbing, cyanosis, or edema MS- no significant deformity or atrophy Skin- no rash or lesion Psych- euthymic mood, full affect Neuro- strength and sensation are intact  EKG-Afib with RVR at 127 bpm, IRBBB, LAFB qrs int 106 ms, qtc 512ms, Epic records reviewed INR's reviewed 12/28- 3.8, 11/30-2.2, 11/1-2.9, 10/11, 2.11  Assessment and Plan: 1. Symptomatic persistent afib Pt in the past has not done well in afib long term Will schedule  for cardioversion, INR's therapeutic, will not require TEE Pt aware of risk/benefit, wishes to proceed with procedure Will ask pt to take an extra 200 mg amiodarone thurs pm, then resume 200 mg amiodarone daily after DCCV BB intolerance Bmet/cbc today  2. HTN Stable  Pt will f/u with Dr. Percival Spanish as scheduled 1/12.

## 2015-07-16 ENCOUNTER — Ambulatory Visit (HOSPITAL_COMMUNITY)
Admission: RE | Admit: 2015-07-16 | Discharge: 2015-07-16 | Disposition: A | Discharge status: Home / Self Care | Payer: Medicare Other | Source: Ambulatory Visit | Attending: Cardiology | Admitting: Cardiology

## 2015-07-16 ENCOUNTER — Encounter (HOSPITAL_COMMUNITY): Payer: Self-pay | Admitting: *Deleted

## 2015-07-16 ENCOUNTER — Ambulatory Visit (HOSPITAL_COMMUNITY): Payer: Medicare Other | Admitting: Anesthesiology

## 2015-07-16 ENCOUNTER — Encounter (HOSPITAL_COMMUNITY)
Admission: RE | Disposition: A | Discharge status: Home / Self Care | Payer: Self-pay | Source: Ambulatory Visit | Attending: Cardiology

## 2015-07-16 DIAGNOSIS — Z7901 Long term (current) use of anticoagulants: Secondary | ICD-10-CM | POA: Insufficient documentation

## 2015-07-16 DIAGNOSIS — Z8249 Family history of ischemic heart disease and other diseases of the circulatory system: Secondary | ICD-10-CM | POA: Diagnosis not present

## 2015-07-16 DIAGNOSIS — G4730 Sleep apnea, unspecified: Secondary | ICD-10-CM | POA: Diagnosis not present

## 2015-07-16 DIAGNOSIS — E785 Hyperlipidemia, unspecified: Secondary | ICD-10-CM | POA: Insufficient documentation

## 2015-07-16 DIAGNOSIS — I2510 Atherosclerotic heart disease of native coronary artery without angina pectoris: Secondary | ICD-10-CM | POA: Diagnosis not present

## 2015-07-16 DIAGNOSIS — I481 Persistent atrial fibrillation: Secondary | ICD-10-CM | POA: Insufficient documentation

## 2015-07-16 DIAGNOSIS — Z955 Presence of coronary angioplasty implant and graft: Secondary | ICD-10-CM | POA: Diagnosis not present

## 2015-07-16 DIAGNOSIS — I4891 Unspecified atrial fibrillation: Secondary | ICD-10-CM | POA: Diagnosis not present

## 2015-07-16 DIAGNOSIS — Z79899 Other long term (current) drug therapy: Secondary | ICD-10-CM | POA: Insufficient documentation

## 2015-07-16 DIAGNOSIS — E039 Hypothyroidism, unspecified: Secondary | ICD-10-CM | POA: Insufficient documentation

## 2015-07-16 DIAGNOSIS — I10 Essential (primary) hypertension: Secondary | ICD-10-CM | POA: Insufficient documentation

## 2015-07-16 HISTORY — PX: CARDIOVERSION: SHX1299

## 2015-07-16 SURGERY — CARDIOVERSION
Anesthesia: General

## 2015-07-16 MED ORDER — SODIUM CHLORIDE 0.9 % IV SOLN
INTRAVENOUS | Status: DC | PRN
  Administered 2015-07-16: 13:00:00 via INTRAVENOUS
  Administered 2015-07-16: 500 mL

## 2015-07-16 MED ORDER — PROPOFOL 10 MG/ML IV BOLUS
INTRAVENOUS | Status: DC | PRN
  Administered 2015-07-16: 60 mg via INTRAVENOUS

## 2015-07-16 MED ORDER — LIDOCAINE HCL (CARDIAC) 20 MG/ML IV SOLN
INTRAVENOUS | Status: DC | PRN
  Administered 2015-07-16: 20 mg via INTRAVENOUS

## 2015-07-16 NOTE — Interval H&P Note (Signed)
History and Physical Interval Note:  07/16/2015 12:35 PM  [name]  has presented today for surgery, with the diagnosis of afib  The various methods of treatment have been discussed with the patient and family. After consideration of risks, benefits and other options for treatment, the patient has consented to  Procedure(s): CARDIOVERSION (N/A) as a surgical intervention .  The patient's history has been reviewed, patient examined, no change in status, stable for surgery.  I have reviewed the patient's chart and labs.  Questions were answered to the patient's satisfaction.     [name]

## 2015-07-16 NOTE — Anesthesia Preprocedure Evaluation (Signed)
Anesthesia Evaluation  Patient identified by MRN, date of birth, ID band Patient awake    Reviewed: Allergy & Precautions, NPO status , Patient's Chart, lab work & pertinent test results  History of Anesthesia Complications Negative for: history of anesthetic complications  Airway Mallampati: II  TM Distance: >3 FB Neck ROM: Full    Dental  (+) Dental Advisory Given   Pulmonary sleep apnea and Continuous Positive Airway Pressure Ventilation ,    breath sounds clear to auscultation       Cardiovascular hypertension, Pt. on medications + angina (chest pain this am, Dr. Johnsie Cancel aware) + CAD and + Peripheral Vascular Disease  + dysrhythmias  Rhythm:Irregular Rate:Tachycardia  '15 ECHO: EF 55-60%, mod MR   Neuro/Psych    GI/Hepatic Neg liver ROS, GERD  Medicated and Controlled,  Endo/Other  Hypothyroidism   Renal/GU negative Renal ROS     Musculoskeletal   Abdominal   Peds  Hematology  (+) Blood dyscrasia (coumadin), ,   Anesthesia Other Findings   Reproductive/Obstetrics                             Anesthesia Physical Anesthesia Plan  ASA: III  Anesthesia Plan: General   Post-op Pain Management:    Induction:   Airway Management Planned: Natural Airway and Mask  Additional Equipment:   Intra-op Plan:   Post-operative Plan:   Informed Consent: I have reviewed the patients History and Physical, chart, labs and discussed the procedure including the risks, benefits and alternatives for the proposed anesthesia with the patient or authorized representative who has indicated his/her understanding and acceptance.     Plan Discussed with: CRNA and Surgeon  Anesthesia Plan Comments:         Anesthesia Quick Evaluation

## 2015-07-16 NOTE — Transfer of Care (Signed)
Immediate Anesthesia Transfer of Care Note  Patient: [name]  Procedure(s) Performed: Procedure(s): CARDIOVERSION (N/A)  Patient Location: PACU and Endoscopy Unit  Anesthesia Type:MAC  Level of Consciousness: awake and alert   Airway & Oxygen Therapy: Patient Spontanous Breathing and Patient connected to nasal cannula oxygen  Post-op Assessment: Report given to RN and Post -op Vital signs reviewed and stable  Post vital signs: Reviewed and stable  Last Vitals:  Filed Vitals:   07/16/15 1409 07/16/15 1410  BP: 121/75   Pulse: 71 71  Temp:    Resp: 14 14    Complications: No apparent anesthesia complications

## 2015-07-16 NOTE — H&P (View-Only) (Signed)
Patient ID: [name], male   DOB: October 13, 1926, 81 y.o.   MRN: [id number]     Primary Care Physician: Nyoka Cowden, MD Referring Physician: [facility]   [name] is a 81 y.o. male with a h/o PAF that was in the coumadin clinic earlier today and mentioned that he had felt weak x 2 weeks and thought he was back in afib. Ekg confirmed that he had afib with RVR.He was referred to the afib clinic for further evaluation. He is on amiodarone and was told to take an extra dose today. His INR's have been therapeutic. INR today at 3.8 and he was instructed to hold coumadin x one night. He does not have any signs of heart failure, just does not have his usual energy in afib. He had a cardioversion in October and states 5 other cardioversion's over the years. The wife and pt feel that the stress of his recent cataract surgery, staying with his son and one fall due to unfamiliar surroundings in the night may had contributed to most recent episode afib. He does have an intolerance to BB's.  Today, he denies symptoms of palpitations, chest pain, shortness of breath, orthopnea, PND, lower extremity edema, dizziness, presyncope, syncope, or neurologic sequela. The patient is tolerating medications without difficulties and is otherwise without complaint today.   Past Medical History  Diagnosis Date  . HYPOTHYROIDISM   . HYPERLIPIDEMIA   . DEPRESSION   . HYPERTENSION   . CORONARY ARTERY DISEASE     a. s/p stent OM 2002. b. s/p BMS 2006 RCA  c. s/p cath 2007.  [name] GERD   . DIVERTICULOSIS, COLON   . BENIGN PROSTATIC HYPERTROPHY   . GYNECOMASTIA, UNILATERAL   . SLEEP APNEA   . Hematoma of abdominal wall   . Mallory - Weiss tear     a. > 5 years ago  . Barrett's esophagus   . Popliteal artery embolism, right ([place])   . History of echocardiogram     a. EF 55% (TEE 06/2014)  . Paroxysmal a-fib ([facility])     a. chronic amiodarone and coumadin;  b. 05/2013 s/p DCCV. c. 06/2014 s/p  TEE/DCCV.  [name] Chronic anticoagulation     a. on Coumadin  . Mitral regurgitation     a. Mild-mod by TEE 06/2014.  [name] Anemia    Past Surgical History  Procedure Laterality Date  . Transurethral resection of prostate    . Coronary angioplasty with stent placement    . Tee with cardioversion  7/08  . Cardioversion  8/08  . Esophagogastroduodenoscopy    . Tee without cardioversion  09/14/2011    Procedure: TRANSESOPHAGEAL ECHOCARDIOGRAM (TEE);  Surgeon: Josue Hector, MD;  Location: [place];  Service: Cardiovascular;  Laterality: N/A;  . Cardioversion  09/14/2011    Procedure: CARDIOVERSION;  Surgeon: Josue Hector, MD;  Location: [place];  Service: Cardiovascular;  Laterality: N/A;  . Cardioversion N/A 03/23/2014    Procedure: CARDIOVERSION;  Surgeon: Thompson Grayer, MD;  Location: [place];  Service: Cardiovascular;  Laterality: N/A;  . Tee without cardioversion N/A 06/22/2014    Procedure: TRANSESOPHAGEAL ECHOCARDIOGRAM (TEE);  Surgeon: [name], MD;  Location: [facility] ENDOSCOPY;  Service: Cardiovascular;  Laterality: N/A;  . Cardioversion N/A 06/22/2014    Procedure: CARDIOVERSION;  Surgeon: [name], MD;  Location: [place];  Service: Cardiovascular;  Laterality: N/A;  . Cardioversion N/A 04/27/2015    Procedure: CARDIOVERSION;  Surgeon: Josue Hector, MD;  Location: [facility] ENDOSCOPY;  Service: Cardiovascular;  Laterality: N/A;    Current Outpatient Prescriptions  Medication Sig Dispense Refill  . amiodarone (PACERONE) 200 MG tablet Take 1 tablet (200 mg total) by mouth every morning.    . clonazePAM (KLONOPIN) 1 MG tablet Take 1 tablet (1 mg total) by mouth at bedtime. 30 tablet 5  . cyanocobalamin (,VITAMIN B-12,) 1000 MCG/ML injection Inject 1 mL (1,000 mcg total) into the muscle as directed. (Patient taking differently: Inject 1,000 mcg into the muscle every 30 (thirty) days. ) 30 mL 10  . DIGESTIVE ENZYMES PO Take 1 tablet by mouth 2 (two) times daily.     . ferrous  sulfate 325 (65 FE) MG tablet Take 325 mg by mouth 2 (two) times daily with a meal.    . finasteride (PROSCAR) 5 MG tablet Take 5 mg by mouth daily.  11  . fish oil-omega-3 fatty acids 1000 MG capsule Take 1 g by mouth 2 (two) times daily.     . hydroxypropyl methylcellulose (ISOPTO TEARS) 2.5 % ophthalmic solution Place 1 drop into both eyes 3 (three) times daily as needed for dry eyes.     [name] levothyroxine (SYNTHROID, LEVOTHROID) 50 MCG tablet Take 1 tablet (50 mcg total) by mouth daily. 90 tablet 3  . Multiple Vitamin (MULTIVITAMIN) capsule Take 1 capsule by mouth daily.    . nitroGLYCERIN (NITROSTAT) 0.4 MG SL tablet Place 0.4 mg under the tongue every 5 (five) minutes as needed for chest pain.     . pantoprazole (PROTONIX) 40 MG tablet Take 40 mg by mouth 2 (two) times daily.     . pravastatin (PRAVACHOL) 40 MG tablet Take 40 mg by mouth every evening.    . Syringe, Disposable, 1 ML MISC 1 each by Does not apply route as directed. 60 each 3  . traZODone (DESYREL) 50 MG tablet Take 50-100 mg by mouth at bedtime as needed for sleep.    [name] warfarin (COUMADIN) 5 MG tablet Take 1 tablet by mouth daily or as directed by coumadin clinic 90 tablet 1   No current facility-administered medications for this encounter.    Allergies  Allergen Reactions  . Metoprolol Tartrate Other (See Comments)    Anxiety, heart rate goes too low, feels like in a fog.    Social History   Social History  . Marital Status: Married    Spouse Name: N/A  . Number of Children: 1  . Years of Education: N/A   Occupational History  . Retired    Social History Main Topics  . Smoking status: Never Smoker   . Smokeless tobacco: Never Used  . Alcohol Use: No  . Drug Use: No  . Sexual Activity: Not on file   Other Topics Concern  . Not on file   Social History Narrative   He is retired from Landscape [employer].   Mother lived into her 58's.  Father died in his 3's.  No CAD.    Family History  Problem Relation  Age of Onset  . Hypertension Other   . Heart disease Brother     CAD male 1st degree relative  . Heart attack Mother   . Stroke Neg Hx     ROS- All systems are reviewed and negative except as per the HPI above  Physical Exam: Filed Vitals:   07/14/15 1518  BP: 120/70  Pulse: 127  Height: 5\' 11"  (1.803 m)  Weight: 191 lb 6.4 oz (86.818 kg)    GEN-  The patient is well appearing, alert and oriented x 3 today.   Head- normocephalic, atraumatic Eyes-  Sclera clear, conjunctiva pink Ears- hearing intact Oropharynx- clear Neck- supple, no JVP Lymph- no cervical lymphadenopathy Lungs- Clear to ausculation bilaterally, normal work of breathing Heart- Rapid irregular rate and rhythm, no murmurs, rubs or gallops, PMI not laterally displaced GI- soft, NT, ND, + BS Extremities- no clubbing, cyanosis, or edema MS- no significant deformity or atrophy Skin- no rash or lesion Psych- euthymic mood, full affect Neuro- strength and sensation are intact  EKG-Afib with RVR at 127 bpm, IRBBB, LAFB qrs int 106 ms, qtc 552ms, Epic records reviewed INR's reviewed 12/28- 3.8, 11/30-2.2, 11/1-2.9, 10/11, 2.11  Assessment and Plan: 1. Symptomatic persistent afib Pt in the past has not done well in afib long term Will schedule  for cardioversion, INR's therapeutic, will not require TEE Pt aware of risk/benefit, wishes to proceed with procedure Will ask pt to take an extra 200 mg amiodarone thurs pm, then resume 200 mg amiodarone daily after DCCV BB intolerance Bmet/cbc today  2. HTN Stable  Pt will f/u with Dr. Percival Spanish as scheduled 1/12.

## 2015-07-16 NOTE — Anesthesia Procedure Notes (Signed)
Procedure Name: MAC Date/Time: 07/16/2015 1:59 PM Performed by: Eligha Bridegroom Pre-anesthesia Checklist: Patient identified, Emergency Drugs available, Suction available, Patient being monitored and Timeout performed Patient Re-evaluated:Patient Re-evaluated prior to inductionOxygen Delivery Method: Nasal cannula Preoxygenation: Pre-oxygenation with 100% oxygen Intubation Type: IV induction

## 2015-07-16 NOTE — Anesthesia Postprocedure Evaluation (Signed)
Anesthesia Post Note  Patient: [name]  Procedure(s) Performed: Procedure(s) (LRB): CARDIOVERSION (N/A)  Patient location during evaluation: PACU Anesthesia Type: General Level of consciousness: awake and alert Pain management: pain level controlled Vital Signs Assessment: post-procedure vital signs reviewed and stable Respiratory status: spontaneous breathing, nonlabored ventilation, respiratory function stable and patient connected to nasal cannula oxygen Cardiovascular status: blood pressure returned to baseline and stable Postop Assessment: no signs of nausea or vomiting Anesthetic complications: no    Last Vitals:  Filed Vitals:   07/16/15 1420 07/16/15 1430  BP: 136/86 153/64  Pulse: 67 65  Temp:    Resp: 13 16    Last Pain: There were no vitals filed for this visit.               Corneilus Heggie,JAMES TERRILL

## 2015-07-16 NOTE — CV Procedure (Signed)
   Cardioversion Note  JAHKYE APA [id number] Apr 08, 1927  Procedure: DC Cardioversion Indications: atrial fibrillation  Procedure Details Consent: Obtained Time Out: Verified patient identification, verified procedure, site/side was marked, verified correct patient position, special equipment/implants available, Radiology Safety Procedures followed,  medications/allergies/relevent history reviewed, required imaging and test results available.  Performed  The patient has been on adequate anticoagulation.  The patient received IV 20 mg of iv Lidocain and 60 mg of iv propofol for sedation.  Synchronous cardioversion was performed at 120 joules.  The cardioversion was successful.   Complications: No apparent complications Patient did tolerate procedure well.   [name], MD, [facility] 07/16/2015, 2:28 PM

## 2015-07-16 NOTE — Discharge Instructions (Signed)
Electrical Cardioversion, Care After °Refer to this sheet in the next few weeks. These instructions provide you with information on caring for yourself after your procedure. Your health care provider may also give you more specific instructions. Your treatment has been planned according to current medical practices, but problems sometimes occur. Call your health care provider if you have any problems or questions after your procedure. °WHAT TO EXPECT AFTER THE PROCEDURE °After your procedure, it is typical to have the following sensations: °· Some redness on the skin where the shocks were delivered. If this is tender, a sunburn lotion or hydrocortisone cream may help. °· Possible return of an abnormal heart rhythm within hours or days after the procedure. °HOME CARE INSTRUCTIONS °· Take medicines only as directed by your health care provider. Be sure you understand how and when to take your medicine. °· Learn how to feel your pulse and check it often. °· Limit your activity for 48 hours after the procedure or as directed by your health care provider. °· Avoid or minimize caffeine and other stimulants as directed by your health care provider. °SEEK MEDICAL CARE IF: °· You feel like your heart is beating too fast or your pulse is not regular. °· You have any questions about your medicines. °· You have bleeding that will not stop. °SEEK IMMEDIATE MEDICAL CARE IF: °· You are dizzy or feel faint. °· It is hard to breathe or you feel short of breath. °· There is a change in discomfort in your chest. °· Your speech is slurred or you have trouble moving an arm or leg on one side of your body. °· You get a serious muscle cramp that does not go away. °· Your fingers or toes turn cold or blue. °  °This information is not intended to replace advice given to you by your health care provider. Make sure you discuss any questions you have with your health care provider. °  °Document Released: 04/23/2013 Document Revised: 07/24/2014  Document Reviewed: 04/23/2013 °Elsevier Interactive Patient Education ©2016 [employer] ° °

## 2015-07-20 ENCOUNTER — Encounter (HOSPITAL_COMMUNITY): Payer: Self-pay | Admitting: Cardiology

## 2015-07-21 ENCOUNTER — Emergency Department (HOSPITAL_COMMUNITY)
Admission: EM | Admit: 2015-07-21 | Discharge: 2015-07-21 | Disposition: A | Discharge status: Home / Self Care | Payer: Medicare Other | Attending: Emergency Medicine | Admitting: Emergency Medicine

## 2015-07-21 ENCOUNTER — Encounter (HOSPITAL_COMMUNITY): Payer: Self-pay | Admitting: *Deleted

## 2015-07-21 ENCOUNTER — Ambulatory Visit (INDEPENDENT_AMBULATORY_CARE_PROVIDER_SITE_OTHER): Payer: Medicare Other | Admitting: Physician Assistant

## 2015-07-21 ENCOUNTER — Emergency Department (HOSPITAL_COMMUNITY): Payer: Medicare Other

## 2015-07-21 ENCOUNTER — Telehealth: Payer: Self-pay

## 2015-07-21 ENCOUNTER — Encounter: Payer: Self-pay | Admitting: Physician Assistant

## 2015-07-21 VITALS — BP 110/72 | HR 129 | Ht 71.0 in | Wt 195.0 lb

## 2015-07-21 DIAGNOSIS — R531 Weakness: Secondary | ICD-10-CM | POA: Diagnosis present

## 2015-07-21 DIAGNOSIS — I517 Cardiomegaly: Secondary | ICD-10-CM | POA: Diagnosis not present

## 2015-07-21 DIAGNOSIS — Z87828 Personal history of other (healed) physical injury and trauma: Secondary | ICD-10-CM | POA: Diagnosis not present

## 2015-07-21 DIAGNOSIS — Z8669 Personal history of other diseases of the nervous system and sense organs: Secondary | ICD-10-CM | POA: Diagnosis not present

## 2015-07-21 DIAGNOSIS — I480 Paroxysmal atrial fibrillation: Secondary | ICD-10-CM | POA: Diagnosis not present

## 2015-07-21 DIAGNOSIS — F329 Major depressive disorder, single episode, unspecified: Secondary | ICD-10-CM | POA: Diagnosis not present

## 2015-07-21 DIAGNOSIS — Z79899 Other long term (current) drug therapy: Secondary | ICD-10-CM | POA: Diagnosis not present

## 2015-07-21 DIAGNOSIS — D649 Anemia, unspecified: Secondary | ICD-10-CM | POA: Insufficient documentation

## 2015-07-21 DIAGNOSIS — Z86718 Personal history of other venous thrombosis and embolism: Secondary | ICD-10-CM | POA: Insufficient documentation

## 2015-07-21 DIAGNOSIS — N400 Enlarged prostate without lower urinary tract symptoms: Secondary | ICD-10-CM | POA: Diagnosis not present

## 2015-07-21 DIAGNOSIS — Z9861 Coronary angioplasty status: Secondary | ICD-10-CM | POA: Insufficient documentation

## 2015-07-21 DIAGNOSIS — E039 Hypothyroidism, unspecified: Secondary | ICD-10-CM | POA: Insufficient documentation

## 2015-07-21 DIAGNOSIS — Z7901 Long term (current) use of anticoagulants: Secondary | ICD-10-CM | POA: Insufficient documentation

## 2015-07-21 DIAGNOSIS — K219 Gastro-esophageal reflux disease without esophagitis: Secondary | ICD-10-CM | POA: Insufficient documentation

## 2015-07-21 DIAGNOSIS — E785 Hyperlipidemia, unspecified: Secondary | ICD-10-CM | POA: Diagnosis not present

## 2015-07-21 DIAGNOSIS — I10 Essential (primary) hypertension: Secondary | ICD-10-CM | POA: Diagnosis not present

## 2015-07-21 DIAGNOSIS — I2510 Atherosclerotic heart disease of native coronary artery without angina pectoris: Secondary | ICD-10-CM | POA: Diagnosis not present

## 2015-07-21 LAB — CBC
HCT: 41.8 % (ref 39.0–52.0)
HEMOGLOBIN: 13.7 g/dL (ref 13.0–17.0)
MCH: 32.2 pg (ref 26.0–34.0)
MCHC: 32.8 g/dL (ref 30.0–36.0)
MCV: 98.4 fL (ref 78.0–100.0)
Platelets: 203 10*3/uL (ref 150–400)
RBC: 4.25 MIL/uL (ref 4.22–5.81)
RDW: 14.3 % (ref 11.5–15.5)
WBC: 7 10*3/uL (ref 4.0–10.5)

## 2015-07-21 LAB — BASIC METABOLIC PANEL
ANION GAP: 9 (ref 5–15)
BUN: 16 mg/dL (ref 6–20)
CALCIUM: 9.5 mg/dL (ref 8.9–10.3)
CO2: 25 mmol/L (ref 22–32)
Chloride: 108 mmol/L (ref 101–111)
Creatinine, Ser: 1.17 mg/dL (ref 0.61–1.24)
GFR calc Af Amer: >60 mL/min (ref >60)
GFR, EST NON AFRICAN AMERICAN: 55 mL/min — AB (ref >60)
GLUCOSE: 103 mg/dL — AB (ref 65–99)
Potassium: 4.1 mmol/L (ref 3.5–5.1)
SODIUM: 142 mmol/L (ref 135–145)

## 2015-07-21 LAB — I-STAT TROPONIN, ED: TROPONIN I, POC: 0.01 ng/mL (ref 0.00–0.08)

## 2015-07-21 LAB — PROTIME-INR
INR: 2.37 — AB (ref 0.00–1.49)
PROTHROMBIN TIME: 25.6 s — AB (ref 11.6–15.2)

## 2015-07-21 MED ORDER — PROPOFOL 10 MG/ML IV BOLUS: INTRAVENOUS | Status: AC | PRN

## 2015-07-21 MED ORDER — AMIODARONE HCL 200 MG PO TABS: 200.0000 mg | ORAL_TABLET | Freq: Two times a day (BID) | ORAL | Status: DC

## 2015-07-21 MED ORDER — PROPOFOL 10 MG/ML IV BOLUS
100.0000 mg | Freq: Once | INTRAVENOUS | Status: DC
  Filled 2015-07-21: qty 20

## 2015-07-21 NOTE — ED Notes (Signed)
Pt is in stable condition upon d/c and has returned to baseline since receiving procedural sedation. Pt is escorted from ED via wheelchair.

## 2015-07-21 NOTE — ED Provider Notes (Signed)
CSN: [id number]     Arrival date & time 07/21/15  1547 History   First MD Initiated Contact with Patient 07/21/15 1701     Chief Complaint  Patient presents with  . Tachycardia  . Weakness    Patient is a 82 y.o. male presenting with weakness. The history is provided by the patient and the spouse.  Weakness This is a new problem. The current episode started 6 to 12 hours ago. The problem occurs constantly. The problem has been gradually worsening. Associated symptoms include shortness of breath. Pertinent negatives include no chest pain, no abdominal pain and no headaches. Nothing aggravates the symptoms. Nothing relieves the symptoms.   Pt presents with fatigue.  He reports he is in atrial fibrillation.  He reports he thinks it started today He reports he feels SOB. No CP No focal weakness No syncope No pain complaints  Past Medical History  Diagnosis Date  . HYPOTHYROIDISM   . HYPERLIPIDEMIA   . DEPRESSION   . HYPERTENSION   . CORONARY ARTERY DISEASE     a. s/p stent OM 2002. b. s/p BMS 2006 RCA  c. s/p cath 2007.  [name] GERD   . DIVERTICULOSIS, COLON   . BENIGN PROSTATIC HYPERTROPHY   . GYNECOMASTIA, UNILATERAL   . SLEEP APNEA   . Hematoma of abdominal wall   . Mallory - Weiss tear     a. > 5 years ago  . Barrett's esophagus   . Popliteal artery embolism, right ([place])   . History of echocardiogram     a. EF 55% (TEE 06/2014)  . Paroxysmal a-fib ([facility])     a. chronic amiodarone and coumadin;  b. 05/2013 s/p DCCV. c. 06/2014 s/p TEE/DCCV.  [name] Chronic anticoagulation     a. on Coumadin  . Mitral regurgitation     a. Mild-mod by TEE 06/2014.  [name] Anemia    Past Surgical History  Procedure Laterality Date  . Transurethral resection of prostate    . Coronary angioplasty with stent placement    . Tee with cardioversion  7/08  . Cardioversion  8/08  . Esophagogastroduodenoscopy    . Tee without cardioversion  09/14/2011    Procedure: TRANSESOPHAGEAL ECHOCARDIOGRAM (TEE);   Surgeon: Josue Hector, MD;  Location: [place];  Service: Cardiovascular;  Laterality: N/A;  . Cardioversion  09/14/2011    Procedure: CARDIOVERSION;  Surgeon: Josue Hector, MD;  Location: [place];  Service: Cardiovascular;  Laterality: N/A;  . Cardioversion N/A 03/23/2014    Procedure: CARDIOVERSION;  Surgeon: Thompson Grayer, MD;  Location: [place];  Service: Cardiovascular;  Laterality: N/A;  . Tee without cardioversion N/A 06/22/2014    Procedure: TRANSESOPHAGEAL ECHOCARDIOGRAM (TEE);  Surgeon: [name], MD;  Location: [place];  Service: Cardiovascular;  Laterality: N/A;  . Cardioversion N/A 06/22/2014    Procedure: CARDIOVERSION;  Surgeon: [name], MD;  Location: [facility] ENDOSCOPY;  Service: Cardiovascular;  Laterality: N/A;  . Cardioversion N/A 04/27/2015    Procedure: CARDIOVERSION;  Surgeon: Josue Hector, MD;  Location: [place];  Service: Cardiovascular;  Laterality: N/A;  . Cardioversion N/A 07/16/2015    Procedure: CARDIOVERSION;  Surgeon: [name], MD;  Location: [facility] ENDOSCOPY;  Service: Cardiovascular;  Laterality: N/A;   Family History  Problem Relation Age of Onset  . Hypertension Other   . Heart disease Brother     CAD male 1st degree relative  . Heart attack Mother   . Stroke Neg Hx    Social  History  Substance Use Topics  . Smoking status: Never Smoker   . Smokeless tobacco: Never Used  . Alcohol Use: No    Review of Systems  Constitutional: Positive for fatigue. Negative for fever.  Respiratory: Positive for shortness of breath.   Cardiovascular: Negative for chest pain.  Gastrointestinal: Negative for abdominal pain and blood in stool.  Neurological: Positive for weakness. Negative for headaches.  All other systems reviewed and are negative.     Allergies  Metoprolol tartrate  Home Medications   Prior to Admission medications   Medication Sig Start Date End Date Taking? Authorizing Provider  amiodarone (PACERONE) 200  MG tablet Take 1 tablet (200 mg total) by mouth 2 (two) times daily. 07/21/15   Rhonda G Barrett, PA-C  clonazePAM (KLONOPIN) 1 MG tablet Take 1 tablet (1 mg total) by mouth at bedtime. 01/08/15   Marletta Lor, MD  cyanocobalamin (,VITAMIN B-12,) 1000 MCG/ML injection Inject 1 mL (1,000 mcg total) into the muscle as directed. Patient taking differently: Inject 1,000 mcg into the muscle every 30 (thirty) days.  12/17/14   Chauncey Cruel, MD  DIGESTIVE ENZYMES PO Take 1 tablet by mouth 2 (two) times daily.     Historical Provider, MD  ferrous sulfate 325 (65 FE) MG tablet Take 325 mg by mouth 2 (two) times daily with a meal.    Historical Provider, MD  finasteride (PROSCAR) 5 MG tablet Take 5 mg by mouth daily. 12/22/14   Historical Provider, MD  fish oil-omega-3 fatty acids 1000 MG capsule Take 1 g by mouth 2 (two) times daily.     Historical Provider, MD  hydroxypropyl methylcellulose (ISOPTO TEARS) 2.5 % ophthalmic solution Place 1 drop into both eyes 3 (three) times daily as needed for dry eyes.     Historical Provider, MD  levothyroxine (SYNTHROID, LEVOTHROID) 50 MCG tablet Take 1 tablet (50 mcg total) by mouth daily. 07/09/14   Marletta Lor, MD  Multiple Vitamin (MULTIVITAMIN) capsule Take 1 capsule by mouth daily.    Historical Provider, MD  nitroGLYCERIN (NITROSTAT) 0.4 MG SL tablet Place 0.4 mg under the tongue every 5 (five) minutes as needed for chest pain.     Historical Provider, MD  pantoprazole (PROTONIX) 40 MG tablet Take 40 mg by mouth 2 (two) times daily.     Historical Provider, MD  pravastatin (PRAVACHOL) 40 MG tablet Take 40 mg by mouth every evening. 03/07/11   Imogene Burn, PA-C  Syringe, Disposable, 1 ML MISC 1 each by Does not apply route as directed. 12/17/14   Chauncey Cruel, MD  traZODone (DESYREL) 50 MG tablet Take 50-100 mg by mouth at bedtime as needed for sleep.    Historical Provider, MD  warfarin (COUMADIN) 5 MG tablet Take 1 tablet by mouth daily or as  directed by coumadin clinic 06/16/15   Minus Breeding, MD   BP 126/84 mmHg  Pulse 120  Temp(Src) 97.2 F (36.2 C) (Oral)  SpO2 98% Physical Exam CONSTITUTIONAL: Well developed/well nourished HEAD: Normocephalic/atraumatic EYES: EOMI ENMT: Mucous membranes moist NECK: supple no meningeal signs SPINE/BACK:entire spine nontender CV: irregular,  murmur noted, tachycardic LUNGS: Lungs are clear to auscultation bilaterally, no apparent distress ABDOMEN: soft, nontender, no rebound or guarding, bowel sounds noted throughout abdomen GU:no cva tenderness NEURO: Pt is awake/alert/appropriate, moves all extremitiesx4.  No facial droop.   EXTREMITIES: pulses normal/equal, full ROM SKIN: warm, color normal PSYCH: no abnormalities of mood noted, alert and oriented to situation  ED Course  .  Cardioversion Date/Time: 07/21/2015 6:05 PM Performed by: Ripley Fraise Authorized by: Ripley Fraise Consent: Verbal consent obtained. Written consent obtained. Risks and benefits: risks, benefits and alternatives were discussed Consent given by: patient Patient identity confirmed: verbally with patient and provided demographic data Time out: Immediately prior to procedure a "time out" was called to verify the correct patient, procedure, equipment, support staff and site/side marked as required. Patient sedated: yes Cardioversion basis: elective Indications: failure of anti-arrhythmic medications Pre-procedure rhythm: atrial fibrillation Patient position: patient was placed in a supine position Chest area: chest area exposed Electrodes: pads Electrodes placed: anterior-posterior Number of attempts: 1 Attempt 1 mode: synchronous Attempt 1 shock (in Joules): 120 Attempt 1 outcome: conversion to normal sinus rhythm Post-procedure rhythm: normal sinus rhythm Complications: no complications Patient tolerance: Patient tolerated the procedure well with no immediate complications    Procedural  sedation Performed by: Sharyon Cable Consent: Verbal consent obtained. Risks and benefits: risks, benefits and alternatives were discussed Required items: required devices, and special equipment available Patient identity confirmed: arm band and provided demographic data Time out: Immediately prior to procedure a "time out" was called to verify the correct patient, procedure, equipment, support staff and site/side marked as required. Sedation type: deep sedation NPO time confirmed and considered Sedatives: PROPOFOL Physician Time at Bedside: 18 Vitals: Vital signs were monitored during sedation. Cardiac Monitor, pulse oximeter Patient tolerance: Patient tolerated the procedure well with no immediate complications. Comments: Pt with uneventful recovery. Returned to pre-procedural sedation baseline    Labs Review Labs Reviewed  BASIC METABOLIC PANEL - Abnormal; Notable for the following:    Glucose, Bld 103 (*)    GFR calc non Af Amer 55 (*)    All other components within normal limits  PROTIME-INR - Abnormal; Notable for the following:    Prothrombin Time 25.6 (*)    INR 2.37 (*)    All other components within normal limits  CBC  I-STAT TROPOININ, ED    Imaging Review Dg Chest 2 View  07/21/2015  CLINICAL DATA:  Cardiac arrhythmia with dizziness EXAM: CHEST  2 VIEW COMPARISON:  April 26, 2015 FINDINGS: There is slight interstitial edema. No airspace consolidation. Heart is enlarged with pulmonary vascularity within normal limits. No adenopathy. Aorta is mildly tortuous. There is degenerative change in the thoracic spine. IMPRESSION: Stable cardiomegaly with trace interstitial edema. No airspace consolidation. The pulmonary vascularity is within normal limits. Electronically Signed   By: Lowella Grip III M.D.   On: 07/21/2015 16:52   I have personally reviewed and evaluated these images and lab results as part of my medical decision-making.   EKG  Interpretation   Date/Time:  Wednesday July 21 2015 15:57:30 EST Ventricular Rate:  120 PR Interval:    QRS Duration: 104 QT Interval:  356 QTC Calculation: 503 R Axis:   -37 Text Interpretation:  Atrial fibrillation with rapid ventricular response  Left axis deviation Incomplete right bundle branch block Nonspecific ST  and T wave abnormality Abnormal ECG Confirmed by Christy Gentles  MD, Elenore Rota  [phone]) on 07/21/2015 5:04:36 PM     5:25 PM Pt with h/o afib He has had multiple cardioversions before Last cardioversion was on 07/16/15 - they used 60mg  propofol and 120J and he did well INR therapeutic today and over past month and he denies missing doses NPO since 9am He was sent by cardiology for cardioversion Will perform today in the ED We discussed risk/benefit and pt would like to have cardioversion 5:38 PM I spoke to  dr Irish Lack with cardiology We reviewed No other recommendations and pt should be appropriate for cardioversion (120J/60mg  propofol) and if tolerates well he can f/u in afib clinic 6:31 PM Pt tolerated well He is back to baseline 7:17 PM Pt awake/alert He is taking PO No signs of stroke He feels well for d/c home    EKG Interpretation  Date/Time:  Wednesday July 21 2015 18:12:26 EST Ventricular Rate:  74 PR Interval:  201 QRS Duration: 116 QT Interval:  442 QTC Calculation: 490 R Axis:   -10 Text Interpretation:  Sinus rhythm Incomplete right bundle branch block Minimal ST elevation, inferior leads no longer in atrial fibrillation Confirmed by Christy Gentles  MD, Elenore Rota ([id number]) on 07/21/2015 6:57:10 PM       MDM   Final diagnoses:  Paroxysmal atrial fibrillation ([place])    Nursing notes including past medical history and social history reviewed and considered in documentation xrays/imaging reviewed by myself and considered during evaluation Labs/vital reviewed myself and considered during evaluation Previous records reviewed and  considered     Ripley Fraise, MD 07/21/15 [phone]

## 2015-07-21 NOTE — Progress Notes (Signed)
Cardiology Office Note   Date:  07/21/2015   ID:  Kevin, [name] Jun 10, 1927, MRN [id number]  PCP:  Nyoka Cowden, MD  Cardiologist:  Dr Mardene Celeste, PA-C   Chief complaint: Atrial fibrillation, rapid ventricular response  History of Present Illness: [name] is a 82 y.o. male with a history of Atrial fibrillation on amiodarone and Coumadin, hypothyroidism, hyperlipidemia, hypertension, CAD, GERD with Wm Sahagun's esophagus.  07/14/2015 he came in for Coumadin check and was noted to be in rapid atrial fibrillation. He was referred to the A. fib clinic and seen by Roderic Palau, NP.   12/30, he had cardioversion and was put into sinus rhythm.  [name] presents for evaluation of atrial fibrillation.  He was doing fine until yesterday. Yesterday, he felt a little weak and noticed his heart rate was up a little bit. Today, since he got out of bed, he noticed that he felt even weaker. He took his blood pressure and noted that his heart rate was very high.   He had eaten breakfast, but never felt like eating any lunch. When he did not get any better after taking his morning medications, he came to the office and was seen as a walk-in.  He denies chest pain or shortness of breath. He feels weak and has significant dyspnea on exertion. He has not noted any lower extremity edema, orthopnea or PND.   Past Medical History  Diagnosis Date  . HYPOTHYROIDISM   . HYPERLIPIDEMIA   . DEPRESSION   . HYPERTENSION   . CORONARY ARTERY DISEASE     a. s/p stent OM 2002. b. s/p BMS 2006 RCA  c. s/p cath 2007.  [name] GERD   . DIVERTICULOSIS, COLON   . BENIGN PROSTATIC HYPERTROPHY   . GYNECOMASTIA, UNILATERAL   . SLEEP APNEA   . Hematoma of abdominal wall   . Mallory - Weiss tear     a. > 5 years ago  . Tyesha Joffe's esophagus   . Popliteal artery embolism, right ([place])   . History of echocardiogram     a. EF 55% (TEE 06/2014)  . Paroxysmal a-fib (HCC)     a.  chronic amiodarone and coumadin;  b. 05/2013 s/p DCCV. c. 06/2014 s/p TEE/DCCV.  [name] Chronic anticoagulation     a. on Coumadin  . Mitral regurgitation     a. Mild-mod by TEE 06/2014.  [name] Anemia     Past Surgical History  Procedure Laterality Date  . Transurethral resection of prostate    . Coronary angioplasty with stent placement    . Tee with cardioversion  7/08  . Cardioversion  8/08  . Esophagogastroduodenoscopy    . Tee without cardioversion  09/14/2011    Procedure: TRANSESOPHAGEAL ECHOCARDIOGRAM (TEE);  Surgeon: Josue Hector, MD;  Location: [place];  Service: Cardiovascular;  Laterality: N/A;  . Cardioversion  09/14/2011    Procedure: CARDIOVERSION;  Surgeon: Josue Hector, MD;  Location: [place];  Service: Cardiovascular;  Laterality: N/A;  . Cardioversion N/A 03/23/2014    Procedure: CARDIOVERSION;  Surgeon: Thompson Grayer, MD;  Location: [place];  Service: Cardiovascular;  Laterality: N/A;  . Tee without cardioversion N/A 06/22/2014    Procedure: TRANSESOPHAGEAL ECHOCARDIOGRAM (TEE);  Surgeon: [name], MD;  Location: [place];  Service: Cardiovascular;  Laterality: N/A;  . Cardioversion N/A 06/22/2014    Procedure: CARDIOVERSION;  Surgeon: [name], MD;  Location: [place];  Service: Cardiovascular;  Laterality: N/A;  .  Cardioversion N/A 04/27/2015    Procedure: CARDIOVERSION;  Surgeon: Josue Hector, MD;  Location: [facility] ENDOSCOPY;  Service: Cardiovascular;  Laterality: N/A;  . Cardioversion N/A 07/16/2015    Procedure: CARDIOVERSION;  Surgeon: [name], MD;  Location: [facility] ENDOSCOPY;  Service: Cardiovascular;  Laterality: N/A;    Current Outpatient Prescriptions  Medication Sig Dispense Refill  . amiodarone (PACERONE) 200 MG tablet Take 1 tablet (200 mg total) by mouth every morning.    . clonazePAM (KLONOPIN) 1 MG tablet Take 1 tablet (1 mg total) by mouth at bedtime. 30 tablet 5  . cyanocobalamin (,VITAMIN B-12,) 1000 MCG/ML injection  Inject 1 mL (1,000 mcg total) into the muscle as directed. (Patient taking differently: Inject 1,000 mcg into the muscle every 30 (thirty) days. ) 30 mL 10  . DIGESTIVE ENZYMES PO Take 1 tablet by mouth 2 (two) times daily.     . ferrous sulfate 325 (65 FE) MG tablet Take 325 mg by mouth 2 (two) times daily with a meal.    . finasteride (PROSCAR) 5 MG tablet Take 5 mg by mouth daily.  11  . fish oil-omega-3 fatty acids 1000 MG capsule Take 1 g by mouth 2 (two) times daily.     . hydroxypropyl methylcellulose (ISOPTO TEARS) 2.5 % ophthalmic solution Place 1 drop into both eyes 3 (three) times daily as needed for dry eyes.     [name] levothyroxine (SYNTHROID, LEVOTHROID) 50 MCG tablet Take 1 tablet (50 mcg total) by mouth daily. 90 tablet 3  . Multiple Vitamin (MULTIVITAMIN) capsule Take 1 capsule by mouth daily.    . nitroGLYCERIN (NITROSTAT) 0.4 MG SL tablet Place 0.4 mg under the tongue every 5 (five) minutes as needed for chest pain.     . pantoprazole (PROTONIX) 40 MG tablet Take 40 mg by mouth 2 (two) times daily.     . pravastatin (PRAVACHOL) 40 MG tablet Take 40 mg by mouth every evening.    . Syringe, Disposable, 1 ML MISC 1 each by Does not apply route as directed. 60 each 3  . traZODone (DESYREL) 50 MG tablet Take 50-100 mg by mouth at bedtime as needed for sleep.    [name] warfarin (COUMADIN) 5 MG tablet Take 1 tablet by mouth daily or as directed by coumadin clinic 90 tablet 1   No current facility-administered medications for this visit.    Allergies:   Metoprolol tartrate    Social History:  The patient  reports that he has never smoked. He has never used smokeless tobacco. He reports that he does not drink alcohol or use illicit drugs.   Family History:  The patient's family history includes Heart attack in his mother; Heart disease in his brother; Hypertension in his other. There is no history of Stroke.    ROS:  Please see the history of present illness. All other systems are reviewed  and negative.    PHYSICAL EXAM: VS:  BP 110/72 mmHg  Pulse 129  Ht 5\' 11"  (1.803 m)  Wt 195 lb (88.451 kg)  BMI 27.21 kg/m2 , BMI Body mass index is 27.21 kg/(m^2). GEN: Well nourished, well developed, male in no acute distress HEENT: normal for age  Neck: no JVD, no carotid bruit, no masses CardiRapid and irregular; no murmur, no rubs, or gallops Respiratory:  clear to auscultation bilaterally, normal work of breathing GI: soft, nontender, nondistended, + BS MS: no deformity or atrophy; no edema; distal pulses are 2+ in all 4 extremities  Skin: warm  and dry, no rash Neuro:  Strength and sensation are intact Psych: euthymic mood, full affect   EKG:  EKG is ordered today. The ekg ordered today demonatrial fibrillation with rapid ventricular response, heart rate 129, incomplete right bundle Kevin block  Recent Labs: 12/02/2014: ALT 18; TSH 4.363 04/26/2015: Magnesium 1.9 07/14/2015: BUN 20; Creatinine, Ser 1.20; Hemoglobin 13.1; Platelets 240; Potassium 4.2; Sodium 141    Lipid Panel    Component Value Date/Time   CHOL 130 10/07/2008 0927   TRIG 32.0 10/07/2008 0927   HDL 61.10 10/07/2008 0927   CHOLHDL 2 10/07/2008 0927   VLDL 6.4 10/07/2008 0927   LDLCALC 63 10/07/2008 0927     Wt Readings from Last 3 Encounters:  07/21/15 195 lb (88.451 kg)  07/16/15 191 lb (86.637 kg)  07/14/15 191 lb 6.4 oz (86.818 kg)     Other studies Reviewed: Additional studies/ records that were reviewed today include office notes and other records.  ASSESSMENT AND PLAN:  1.  Atrial fibrillation, rapid ventricular response: He feels terrible when in atrial fibrillation. His heart rate is elevated and his blood pressure is not high enough to allow [place] to give very much the way of medications to bring it down. He is compliant with his medications and his Coumadin.  He has not eaten since breakfast. I contacted the cath lab, but they are not able to get a cardioversion today or tomorrow. I  spoke with the ER physician who states they would be able to cardiovert him for rate control does not work. I discussed this with the patient and his wife and he will go to the emergency room for treatment and possibly cardioversion.  2. Chronic anticoagulation with Coumadin: This patients CHA2DS2-VASc Score and unadjusted Ischemic Stroke Rate (% per year) is equal to 4.8 % stroke rate/year from a score of 4 Above score calculated as 1 point each if present [CHF, HTN, DM, Vascular=MI/PAD/Aortic Plaque, Age if 65-74, or Male], 2 points each if present [Age > 75, or Stroke/TIA/TE]   Current medicines are reviewed at length with the patient today.  The patient does not have concerns regarding medicines.  The following changes have been made:  Increase amiodarone to 200 mg twice a day   Labs/ tests ordered today include: ECG  Disposition:   FU with Dr. Percival Spanish  Signed, Lenoard Aden  07/21/2015 3:01 PM    [facility] [address], [place], [place]  [address] Phone: [phone]; Fax: [phone]

## 2015-07-21 NOTE — Sedation Documentation (Signed)
Pt has converted to NSR after 1 shock at 120 J.

## 2015-07-21 NOTE — Patient Instructions (Signed)
Medication Instructions:  Your physician recommends that you continue on your current medications as directed. Please refer to the Current Medication list given to you today.   Labwork: None orderd  Testing/Procedures: None ordered  Follow-Up: Please head to [facility] ED   We will call you with a follow up appointment with our AFIB clinic   Any Other Special Instructions Will Be Listed Below (If Applicable).     If you need a refill on your cardiac medications before your next appointment, please call your pharmacy.

## 2015-07-21 NOTE — ED Notes (Signed)
Pt states he went to his PCP today because he knew his heart was out of rhythm, states he had a cardioversion last Friday. His PCP did an EKG and instructed the pt to come to the ED because of fast heart rate. Pt c/o feeling weak.

## 2015-07-21 NOTE — Telephone Encounter (Signed)
Lmom. Pt has been scheduled for a f/u with the Afib clinic for 1/11 @ 10:30am. Per Armanda Heritage Provided the Afib tel # for them to get the appropriate directions or if pt needs to reschedule

## 2015-07-21 NOTE — Discharge Instructions (Signed)
Atrial Fibrillation Atrial fibrillation is a type of heartbeat that is irregular or fast (rapid). If you have this condition, your heart keeps quivering in a weird (chaotic) way. This condition can make it so your heart cannot pump blood normally. Having this condition gives a person more risk for stroke, heart failure, and other heart problems. There are different types of atrial fibrillation. Talk with your doctor to learn about the type that you have. HOME CARE  Take over-the-counter and prescription medicines only as told by your doctor.  If your doctor prescribed a blood-thinning medicine, take it exactly as told. Taking too much of it can cause bleeding. If you do not take enough of it, you will not have the protection that you need against stroke and other problems.  Do not use any tobacco products. These include cigarettes, chewing tobacco, and e-cigarettes. If you need help quitting, ask your doctor.  If you have apnea (obstructive sleep apnea), manage it as told by your doctor.  Do not drink alcohol.  Do not drink beverages that have caffeine. These include coffee, soda, and tea.  Maintain a healthy weight. Do not use diet pills unless your doctor says they are safe for you. Diet pills may make heart problems worse.  Follow diet instructions as told by your doctor.  Exercise regularly as told by your doctor.  Keep all follow-up visits as told by your doctor. This is important. GET HELP IF:  You notice a change in the speed, rhythm, or strength of your heartbeat.  You are taking a blood-thinning medicine and you notice more bruising.  You get tired more easily when you move or exercise. GET HELP RIGHT AWAY IF:  You have pain in your chest or your belly (abdomen).  You have sweating or weakness.  You feel sick to your stomach (nauseous).  You notice blood in your throw up (vomit), poop (stool), or pee (urine).  You are short of breath.  You suddenly have swollen feet  and ankles.  You feel dizzy.  Your suddenly get weak or numb in your face, arms, or legs, especially if it happens on one side of your body.  You have trouble talking, trouble understanding, or both.  Your face or your eyelid droops on one side. These symptoms may be an emergency. Do not wait to see if the symptoms will go away. Get medical help right away. Call your local emergency services (911 in the [place] Do not drive yourself to the hospital.   This information is not intended to replace advice given to you by your health care provider. Make sure you discuss any questions you have with your health care provider.   Document Released: 04/11/2008 Document Revised: 03/24/2015 Document Reviewed: 10/28/2014 Elsevier Interactive Patient Education 2016 [employer].  Hospital doctor cardioversion is the delivery of a jolt of electricity to change the rhythm of the heart. Sticky patches or metal paddles are placed on the chest to deliver the electricity from a device. This is done to restore a normal rhythm. A rhythm that is too fast or not regular keeps the heart from pumping well. Electrical cardioversion is done in an emergency if:   There is low or no blood pressure as a result of the heart rhythm.   Normal rhythm must be restored as fast as possible to protect the brain and heart from further damage.   It may save a life. Cardioversion may be done for heart rhythms that are not immediately life  threatening, such as atrial fibrillation or flutter, in which:   The heart is beating too fast or is not regular.   Medicine to change the rhythm has not worked.   It is safe to wait in order to allow time for preparation.  Symptoms of the abnormal rhythm are bothersome.  The risk of stroke and other serious problems can be reduced. LET [facility] CARE PROVIDER KNOW ABOUT:   Any allergies you have.  All medicines you are taking, including vitamins, herbs, eye  drops, creams, and over-the-counter medicines.  Previous problems you or members of your family have had with the use of anesthetics.   Any blood disorders you have.   Previous surgeries you have had.   Medical conditions you have. RISKS AND COMPLICATIONS  Generally, this is a safe procedure. However, problems can occur and include:   Breathing problems related to the anesthetic used.  A blood clot that breaks free and travels to other parts of your body. This could cause a stroke or other problems. The risk of this is lowered by use of blood-thinning medicine (anticoagulant) prior to the procedure.  Cardiac arrest (rare). BEFORE THE PROCEDURE   You may have tests to detect blood clots in your heart and to evaluate heart function.  You may start taking anticoagulants so your blood does not clot as easily.   Medicines may be given to help stabilize your heart rate and rhythm. PROCEDURE  You will be given medicine through an IV tube to reduce discomfort and make you sleepy (sedative).   An electrical shock will be delivered. AFTER THE PROCEDURE Your heart rhythm will be watched to make sure it does not change.    This information is not intended to replace advice given to you by your health care provider. Make sure you discuss any questions you have with your health care provider.   Document Released: 06/23/2002 Document Revised: 07/24/2014 Document Reviewed: 01/15/2013 Elsevier Interactive Patient Education [employer].

## 2015-07-28 ENCOUNTER — Inpatient Hospital Stay (HOSPITAL_COMMUNITY): Admit: 2015-07-28 | Payer: Medicare Other | Admitting: Nurse Practitioner

## 2015-07-29 ENCOUNTER — Ambulatory Visit (INDEPENDENT_AMBULATORY_CARE_PROVIDER_SITE_OTHER): Payer: Medicare Other | Admitting: Cardiology

## 2015-07-29 ENCOUNTER — Encounter: Payer: Self-pay | Admitting: Cardiology

## 2015-07-29 ENCOUNTER — Ambulatory Visit (INDEPENDENT_AMBULATORY_CARE_PROVIDER_SITE_OTHER): Payer: Medicare Other | Admitting: Pharmacist Clinician (PhC)/ Clinical Pharmacy Specialist

## 2015-07-29 VITALS — BP 128/76 | HR 60 | Ht 72.0 in | Wt 187.7 lb

## 2015-07-29 DIAGNOSIS — Z5181 Encounter for therapeutic drug level monitoring: Secondary | ICD-10-CM | POA: Diagnosis not present

## 2015-07-29 DIAGNOSIS — I480 Paroxysmal atrial fibrillation: Secondary | ICD-10-CM

## 2015-07-29 DIAGNOSIS — I4891 Unspecified atrial fibrillation: Secondary | ICD-10-CM | POA: Diagnosis not present

## 2015-07-29 DIAGNOSIS — Z7901 Long term (current) use of anticoagulants: Secondary | ICD-10-CM

## 2015-07-29 LAB — POCT INR: INR: 3.6

## 2015-07-29 MED ORDER — AMLODIPINE BESYLATE 5 MG PO TABS: 5.0000 mg | ORAL_TABLET | Freq: Every day | ORAL | Status: DC

## 2015-07-29 NOTE — Patient Instructions (Signed)
Medication Instructions:  Your physician has recommended you make the following change in your medication:  1) START Norvasc 5 mg by mouth ONCE daily   Labwork: Your physician recommends that you return for lab work in: 2 weeks - Amiodarone level The lab can be found on the FIRST FLOOR of out building in [address]   Testing/Procedures: none  Follow-Up: Your physician recommends that you schedule a follow-up appointment in: 1 month with Dr. Percival Spanish   Any Other Special Instructions Will Be Listed Below (If Applicable).     If you need a refill on your cardiac medications before your next appointment, please call your pharmacy.

## 2015-07-29 NOTE — Progress Notes (Signed)
HPI The patient presents for followup after recent visits for atrial fibrillation. Since I last saw him he's been in the emergency room twice and has required cardioversion.  He has had even increasing weakness since that time as this has been significant complaint.  He has chronic fatigue. He has chronic dyspnea. However, he has no increased episodes of these.  He's not describing PND or orthopnea. He's not having presyncope or syncope. He gets around slowly with a walker.  Allergies  Allergen Reactions  . Metoprolol Tartrate Other (See Comments)    Anxiety, heart rate goes too low, feels like in a fog.    Current Outpatient Prescriptions  Medication Sig Dispense Refill  . amiodarone (PACERONE) 200 MG tablet Take 1 tablet (200 mg total) by mouth 2 (two) times daily. 60 tablet 11  . clonazePAM (KLONOPIN) 1 MG tablet Take 1 tablet (1 mg total) by mouth at bedtime. 30 tablet 5  . cyanocobalamin (,VITAMIN B-12,) 1000 MCG/ML injection Inject 1 mL (1,000 mcg total) into the muscle as directed. 30 mL 10  . DIGESTIVE ENZYMES PO Take 1 tablet by mouth 2 (two) times daily.     . ferrous sulfate 325 (65 FE) MG tablet Take 325 mg by mouth 2 (two) times daily with a meal.    . finasteride (PROSCAR) 5 MG tablet Take 5 mg by mouth daily.  11  . fish oil-omega-3 fatty acids 1000 MG capsule Take 1 g by mouth 2 (two) times daily.     . hydroxypropyl methylcellulose (ISOPTO TEARS) 2.5 % ophthalmic solution Place 1 drop into both eyes 3 (three) times daily as needed for dry eyes.     [name] levothyroxine (SYNTHROID, LEVOTHROID) 50 MCG tablet Take 50 mcg by mouth See admin instructions. On Tuesday thursdays and saturdays patient stats he takes a full tablet then take a half along with it on these days. 1 whole tablet rest of days    . Multiple Vitamin (MULTIVITAMIN) capsule Take 1 capsule by mouth daily.    . nitroGLYCERIN (NITROSTAT) 0.4 MG SL tablet Place 0.4 mg under the tongue every 5 (five) minutes as needed for  chest pain.     . pantoprazole (PROTONIX) 40 MG tablet Take 40 mg by mouth 2 (two) times daily.     . pravastatin (PRAVACHOL) 40 MG tablet Take 40 mg by mouth every evening.    . Syringe, Disposable, 1 ML MISC 1 each by Does not apply route as directed. 60 each 3  . traZODone (DESYREL) 50 MG tablet Take by mouth at bedtime as needed for sleep.     [name] warfarin (COUMADIN) 5 MG tablet Take 2.5-5 mg by mouth daily. Take 5mg  on Monday Wednesday and fridays. Then take half of the 5mg  rest of days     No current facility-administered medications for this visit.    Past Medical History  Diagnosis Date  . HYPOTHYROIDISM   . HYPERLIPIDEMIA   . DEPRESSION   . HYPERTENSION   . CORONARY ARTERY DISEASE     a. s/p stent OM 2002. b. s/p BMS 2006 RCA  c. s/p cath 2007.  [name] GERD   . DIVERTICULOSIS, COLON   . BENIGN PROSTATIC HYPERTROPHY   . GYNECOMASTIA, UNILATERAL   . SLEEP APNEA   . Hematoma of abdominal wall   . Mallory - Weiss tear     a. > 5 years ago  . Barrett's esophagus   . Popliteal artery embolism, right ([place])   . History of echocardiogram  a. EF 55% (TEE 06/2014)  . Paroxysmal a-fib (HCC)     a. chronic amiodarone and coumadin;  b. 05/2013 s/p DCCV. c. 06/2014 s/p TEE/DCCV.  [name] Chronic anticoagulation     a. on Coumadin  . Mitral regurgitation     a. Mild-mod by TEE 06/2014.  [name] Anemia     Past Surgical History  Procedure Laterality Date  . Transurethral resection of prostate    . Coronary angioplasty with stent placement    . Tee with cardioversion  7/08  . Cardioversion  8/08  . Esophagogastroduodenoscopy    . Tee without cardioversion  09/14/2011    Procedure: TRANSESOPHAGEAL ECHOCARDIOGRAM (TEE);  Surgeon: Josue Hector, MD;  Location: [place];  Service: Cardiovascular;  Laterality: N/A;  . Cardioversion  09/14/2011    Procedure: CARDIOVERSION;  Surgeon: Josue Hector, MD;  Location: [place];  Service: Cardiovascular;  Laterality: N/A;  . Cardioversion N/A  03/23/2014    Procedure: CARDIOVERSION;  Surgeon: Thompson Grayer, MD;  Location: [place];  Service: Cardiovascular;  Laterality: N/A;  . Tee without cardioversion N/A 06/22/2014    Procedure: TRANSESOPHAGEAL ECHOCARDIOGRAM (TEE);  Surgeon: [name], MD;  Location: [place];  Service: Cardiovascular;  Laterality: N/A;  . Cardioversion N/A 06/22/2014    Procedure: CARDIOVERSION;  Surgeon: [name], MD;  Location: [facility] ENDOSCOPY;  Service: Cardiovascular;  Laterality: N/A;  . Cardioversion N/A 04/27/2015    Procedure: CARDIOVERSION;  Surgeon: Josue Hector, MD;  Location: [place];  Service: Cardiovascular;  Laterality: N/A;  . Cardioversion N/A 07/16/2015    Procedure: CARDIOVERSION;  Surgeon: [name], MD;  Location: [facility] ENDOSCOPY;  Service: Cardiovascular;  Laterality: N/A;    ROS:  As stated in the HPI and negative for all other systems.  PHYSICAL EXAM BP 128/76 mmHg  Pulse 60  Ht 6' (1.829 m)  Wt 187 lb 11.2 oz (85.14 kg)  BMI 25.45 kg/m2 GENERAL: No acute distress NECK:  No jugular venous distention, waveform within normal limits, carotid upstroke brisk and symmetric, no bruits, no thyromegaly LYMPHATICS:  No cervical, inguinal adenopathy LUNGS:  Clear to auscultation bilaterally BACK:  No CVA tenderness CHEST:  Unremarkable HEART:  PMI not displaced or sustained,S1 and S2 within normal limits, no S3, no S4, no clicks, no rubs, apical and axillary late systolic murmur ABD:  Flat, positive bowel sounds normal in frequency in pitch, no bruits, no rebound, no guarding, no midline pulsatile mass, no hepatomegaly, no splenomegaly EXT:  2 plus pulses throughout, no edema, no cyanosis no clubbing  EKG:  Sinus rhythm, rate 60, left axis deviation, incomplete right bundle branch block, no acute ST-T wave changes.  07/29/2015   ASSESSMENT AND PLAN  ATRIAL FIBRILLATION - He is maintaining NSR after this most recent cardioversion. For now he'll continue the amiodarone  twice a day. In about 2 weeks on going to get a level and try to judge whether he needs more of a loading based on this.  BETA BLOCKER INTOLERANCE - This was added to his list of allergies.  CORONARY ARTERY DISEASE -  The patient has no new sypmtoms.  No further cardiovascular testing is indicated.  We will continue with aggressive risk reduction and meds as listed.  He has a stable infrequent anginal pattern.  HYPERTENSION -  He's been taking 10 mg of amlodipine when he thinks his blood pressure is up. His son is concerned because long-term he hasn't tolerated this. We agree to try 5 mg daily  and he will continue to follow his blood pressure at home.  MITRAL REGURGITATION -  This was moderate on the last echo and we will follow this clinically.   FATIGUE - Unchanged

## 2015-08-09 ENCOUNTER — Ambulatory Visit (INDEPENDENT_AMBULATORY_CARE_PROVIDER_SITE_OTHER): Payer: Medicare Other | Admitting: Pharmacist Clinician (PhC)/ Clinical Pharmacy Specialist

## 2015-08-09 DIAGNOSIS — Z7901 Long term (current) use of anticoagulants: Secondary | ICD-10-CM | POA: Diagnosis not present

## 2015-08-09 DIAGNOSIS — I4891 Unspecified atrial fibrillation: Secondary | ICD-10-CM

## 2015-08-09 DIAGNOSIS — I480 Paroxysmal atrial fibrillation: Secondary | ICD-10-CM | POA: Diagnosis not present

## 2015-08-09 DIAGNOSIS — Z5181 Encounter for therapeutic drug level monitoring: Secondary | ICD-10-CM

## 2015-08-09 LAB — POCT INR: INR: 2.5

## 2015-08-11 LAB — AMIODARONE LEVEL
AMIODARONE LVL: 1.6 ug/mL (ref 1.5–2.5)
DESETHYLAMIODARONE: 1.2 ug/mL — AB (ref 1.5–2.5)

## 2015-08-15 NOTE — Progress Notes (Signed)
Continue 200 mg bid x 2 weeks then 200 mg daily after this.

## 2015-08-24 ENCOUNTER — Telehealth: Payer: Self-pay | Admitting: *Deleted

## 2015-08-24 ENCOUNTER — Encounter: Payer: Self-pay | Admitting: Cardiology

## 2015-08-24 ENCOUNTER — Encounter: Payer: Self-pay | Admitting: *Deleted

## 2015-08-24 ENCOUNTER — Ambulatory Visit (INDEPENDENT_AMBULATORY_CARE_PROVIDER_SITE_OTHER): Payer: Medicare Other | Admitting: Cardiology

## 2015-08-24 ENCOUNTER — Ambulatory Visit (INDEPENDENT_AMBULATORY_CARE_PROVIDER_SITE_OTHER): Payer: Medicare Other | Admitting: Pharmacist Clinician (PhC)/ Clinical Pharmacy Specialist

## 2015-08-24 VITALS — BP 140/76 | HR 64 | Ht 71.0 in | Wt 190.0 lb

## 2015-08-24 DIAGNOSIS — I4891 Unspecified atrial fibrillation: Secondary | ICD-10-CM | POA: Diagnosis not present

## 2015-08-24 DIAGNOSIS — Z7901 Long term (current) use of anticoagulants: Secondary | ICD-10-CM | POA: Diagnosis not present

## 2015-08-24 DIAGNOSIS — I480 Paroxysmal atrial fibrillation: Secondary | ICD-10-CM

## 2015-08-24 DIAGNOSIS — Z5181 Encounter for therapeutic drug level monitoring: Secondary | ICD-10-CM

## 2015-08-24 LAB — POCT INR: INR: 2.3

## 2015-08-24 MED ORDER — AMIODARONE HCL 200 MG PO TABS: 200.0000 mg | ORAL_TABLET | Freq: Every day | ORAL | Status: DC

## 2015-08-24 NOTE — Telephone Encounter (Signed)
Faxed surgical clearance letter along with copy of today's office note for patient to have cataract surgery through the [facility].

## 2015-08-24 NOTE — Patient Instructions (Signed)
Your physician has recommended you make the following change in your medication:   Starting on February 14th  Decrease the amiodarone from 2 times daily to once DAILY.  Your physician recommends that you schedule a follow-up appointment in: 6 weeks with Dr Percival Spanish.

## 2015-08-24 NOTE — Progress Notes (Signed)
HPI The patient presents for followup after recent visits for atrial fibrillation.  I did send him for an amiodarone level. This demonstrated a level that was slightly below therapeutic. He was instructed to continue his amiodarone twice a day for now. He denies any new palpitations, presyncope or syncope. He's had no new shortness of breath, chest pressure, neck or arm discomfort. I did review some outside labs. His TSH was mildly elevated. Of note he's going to have cataract surgery. He still gets around slowly with his walker.  Allergies  Allergen Reactions  . Metoprolol Tartrate Other (See Comments)    Anxiety, heart rate goes too low, feels like in a fog.    Current Outpatient Prescriptions  Medication Sig Dispense Refill  . amiodarone (PACERONE) 200 MG tablet Take 1 tablet (200 mg total) by mouth 2 (two) times daily. 60 tablet 11  . amLODipine (NORVASC) 5 MG tablet Take 1 tablet (5 mg total) by mouth daily. 30 tablet 3  . clonazePAM (KLONOPIN) 1 MG tablet Take 1 tablet (1 mg total) by mouth at bedtime. 30 tablet 5  . cyanocobalamin (,VITAMIN B-12,) 1000 MCG/ML injection Inject 1 mL (1,000 mcg total) into the muscle as directed. 30 mL 10  . DIGESTIVE ENZYMES PO Take 1 tablet by mouth 2 (two) times daily.     . ferrous sulfate 325 (65 FE) MG tablet Take 325 mg by mouth 2 (two) times daily with a meal.    . finasteride (PROSCAR) 5 MG tablet Take 5 mg by mouth daily.  11  . fish oil-omega-3 fatty acids 1000 MG capsule Take 1 g by mouth 2 (two) times daily.     . hydroxypropyl methylcellulose (ISOPTO TEARS) 2.5 % ophthalmic solution Place 1 drop into both eyes 3 (three) times daily as needed for dry eyes.     [name] levothyroxine (SYNTHROID, LEVOTHROID) 50 MCG tablet Take 50 mcg by mouth See admin instructions. On Tuesday thursdays and saturdays patient stats he takes a full tablet then take a half along with it on these days. 1 whole tablet rest of days    . Multiple Vitamin (MULTIVITAMIN)  capsule Take 1 capsule by mouth daily.    . nitroGLYCERIN (NITROSTAT) 0.4 MG SL tablet Place 0.4 mg under the tongue every 5 (five) minutes as needed for chest pain.     . pantoprazole (PROTONIX) 40 MG tablet Take 40 mg by mouth 2 (two) times daily.     . pravastatin (PRAVACHOL) 40 MG tablet Take 40 mg by mouth every evening.    . Syringe, Disposable, 1 ML MISC 1 each by Does not apply route as directed. 60 each 3  . traZODone (DESYREL) 50 MG tablet Take by mouth at bedtime as needed for sleep.     [name] warfarin (COUMADIN) 5 MG tablet Take 2.5-5 mg by mouth daily. Take 5mg  on Monday Wednesday and fridays. Then take half of the 5mg  rest of days     No current facility-administered medications for this visit.    Past Medical History  Diagnosis Date  . HYPOTHYROIDISM   . HYPERLIPIDEMIA   . DEPRESSION   . HYPERTENSION   . CORONARY ARTERY DISEASE     a. s/p stent OM 2002. b. s/p BMS 2006 RCA  c. s/p cath 2007.  [name] GERD   . DIVERTICULOSIS, COLON   . BENIGN PROSTATIC HYPERTROPHY   . GYNECOMASTIA, UNILATERAL   . SLEEP APNEA   . Hematoma of abdominal wall   . Mallory - Mariel Kansky  tear     a. > 5 years ago  . Barrett's esophagus   . Popliteal artery embolism, right ([place])   . History of echocardiogram     a. EF 55% (TEE 06/2014)  . Paroxysmal a-fib (HCC)     a. chronic amiodarone and coumadin;  b. 05/2013 s/p DCCV. c. 06/2014 s/p TEE/DCCV.  [name] Chronic anticoagulation     a. on Coumadin  . Mitral regurgitation     a. Mild-mod by TEE 06/2014.  [name] Anemia     Past Surgical History  Procedure Laterality Date  . Transurethral resection of prostate    . Coronary angioplasty with stent placement    . Tee with cardioversion  7/08  . Cardioversion  8/08  . Esophagogastroduodenoscopy    . Tee without cardioversion  09/14/2011    Procedure: TRANSESOPHAGEAL ECHOCARDIOGRAM (TEE);  Surgeon: Josue Hector, MD;  Location: [place];  Service: Cardiovascular;  Laterality: N/A;  . Cardioversion  09/14/2011     Procedure: CARDIOVERSION;  Surgeon: Josue Hector, MD;  Location: [place];  Service: Cardiovascular;  Laterality: N/A;  . Cardioversion N/A 03/23/2014    Procedure: CARDIOVERSION;  Surgeon: Thompson Grayer, MD;  Location: [place];  Service: Cardiovascular;  Laterality: N/A;  . Tee without cardioversion N/A 06/22/2014    Procedure: TRANSESOPHAGEAL ECHOCARDIOGRAM (TEE);  Surgeon: [name], MD;  Location: [place];  Service: Cardiovascular;  Laterality: N/A;  . Cardioversion N/A 06/22/2014    Procedure: CARDIOVERSION;  Surgeon: [name], MD;  Location: [facility] ENDOSCOPY;  Service: Cardiovascular;  Laterality: N/A;  . Cardioversion N/A 04/27/2015    Procedure: CARDIOVERSION;  Surgeon: Josue Hector, MD;  Location: [place];  Service: Cardiovascular;  Laterality: N/A;  . Cardioversion N/A 07/16/2015    Procedure: CARDIOVERSION;  Surgeon: [name], MD;  Location: [facility] ENDOSCOPY;  Service: Cardiovascular;  Laterality: N/A;    ROS:  As stated in the HPI and negative for all other systems.  PHYSICAL EXAM BP 140/76 mmHg  Pulse 64  Ht 5\' 11"  (1.803 m)  Wt 190 lb (86.183 kg)  BMI 26.51 kg/m2 GENERAL: No acute distress NECK:  No jugular venous distention, waveform within normal limits, carotid upstroke brisk and symmetric, no bruits, no thyromegaly LYMPHATICS:  No cervical, inguinal adenopathy LUNGS:  Clear to auscultation bilaterally BACK:  No CVA tenderness CHEST:  Unremarkable HEART:  PMI not displaced or sustained,S1 and S2 within normal limits, no S3, no S4, no clicks, no rubs, apical and axillary late systolic murmur ABD:  Flat, positive bowel sounds normal in frequency in pitch, no bruits, no rebound, no guarding, no midline pulsatile mass, no hepatomegaly, no splenomegaly EXT:  2 plus pulses throughout, no edema, no cyanosis no clubbing    ASSESSMENT AND PLAN  ATRIAL FIBRILLATION - His amiodarone level was slightly subtherapeutic.  He will continue the bid  amio for another two weeks.  He can then reduce to once daily.    BETA BLOCKER INTOLERANCE - This was added to his list of allergies.  CORONARY ARTERY DISEASE -  The patient has no new sypmtoms.  No further cardiovascular testing is indicated.  We will continue with aggressive risk reduction and meds as listed.    HYPERTENSION -  The blood pressure is at target. No change in medications is indicated. We will continue with therapeutic lifestyle changes (TLC).  MITRAL REGURGITATION -  This was moderate on the last echo and we will follow this clinically.   FATIGUE - Unchanged  PREOP - He has no cardiovascular contraindications to planned cataract surgery. Certainly advanced age and overall condition would put him at some risk for any procedure and he will proceed per his ophthalmologist.

## 2015-09-02 ENCOUNTER — Ambulatory Visit: Payer: Medicare Other | Admitting: Cardiology

## 2015-09-03 ENCOUNTER — Ambulatory Visit: Payer: Medicare Other | Admitting: Cardiology

## 2015-09-03 ENCOUNTER — Encounter: Payer: Medicare Other | Admitting: Pharmacist Clinician (PhC)/ Clinical Pharmacy Specialist

## 2015-09-14 ENCOUNTER — Ambulatory Visit (INDEPENDENT_AMBULATORY_CARE_PROVIDER_SITE_OTHER): Payer: Medicare Other | Admitting: Pharmacist Clinician (PhC)/ Clinical Pharmacy Specialist

## 2015-09-14 DIAGNOSIS — Z7901 Long term (current) use of anticoagulants: Secondary | ICD-10-CM

## 2015-09-14 DIAGNOSIS — I4891 Unspecified atrial fibrillation: Secondary | ICD-10-CM

## 2015-09-14 DIAGNOSIS — Z5181 Encounter for therapeutic drug level monitoring: Secondary | ICD-10-CM

## 2015-09-14 LAB — POCT INR: INR: 1.9

## 2015-10-05 ENCOUNTER — Encounter: Payer: Self-pay | Admitting: Cardiology

## 2015-10-05 ENCOUNTER — Ambulatory Visit (INDEPENDENT_AMBULATORY_CARE_PROVIDER_SITE_OTHER): Payer: Medicare Other | Admitting: Cardiology

## 2015-10-05 ENCOUNTER — Ambulatory Visit (INDEPENDENT_AMBULATORY_CARE_PROVIDER_SITE_OTHER): Payer: Medicare Other | Admitting: Pharmacist Clinician (PhC)/ Clinical Pharmacy Specialist

## 2015-10-05 VITALS — BP 128/70 | HR 68 | Ht 70.0 in | Wt 190.0 lb

## 2015-10-05 DIAGNOSIS — Z7901 Long term (current) use of anticoagulants: Secondary | ICD-10-CM

## 2015-10-05 DIAGNOSIS — I4891 Unspecified atrial fibrillation: Secondary | ICD-10-CM | POA: Diagnosis not present

## 2015-10-05 DIAGNOSIS — Z79899 Other long term (current) drug therapy: Secondary | ICD-10-CM | POA: Diagnosis not present

## 2015-10-05 DIAGNOSIS — L739 Follicular disorder, unspecified: Secondary | ICD-10-CM | POA: Diagnosis not present

## 2015-10-05 DIAGNOSIS — L308 Other specified dermatitis: Secondary | ICD-10-CM | POA: Diagnosis not present

## 2015-10-05 DIAGNOSIS — L812 Freckles: Secondary | ICD-10-CM | POA: Diagnosis not present

## 2015-10-05 DIAGNOSIS — L821 Other seborrheic keratosis: Secondary | ICD-10-CM | POA: Diagnosis not present

## 2015-10-05 DIAGNOSIS — Z5181 Encounter for therapeutic drug level monitoring: Secondary | ICD-10-CM

## 2015-10-05 LAB — CBC
HCT: 40.4 % (ref 39.0–52.0)
Hemoglobin: 13.2 g/dL (ref 13.0–17.0)
MCH: 31.9 pg (ref 26.0–34.0)
MCHC: 32.7 g/dL (ref 30.0–36.0)
MCV: 97.6 fL (ref 78.0–100.0)
MPV: 10.9 fL (ref 8.6–12.4)
PLATELETS: 170 10*3/uL (ref 150–400)
RBC: 4.14 MIL/uL — AB (ref 4.22–5.81)
RDW: 14.6 % (ref 11.5–15.5)
WBC: 5.6 10*3/uL (ref 4.0–10.5)

## 2015-10-05 LAB — HEPATIC FUNCTION PANEL
ALT: 24 U/L (ref 9–46)
AST: 30 U/L (ref 10–35)
Albumin: 3.9 g/dL (ref 3.6–5.1)
Alkaline Phosphatase: 73 U/L (ref 40–115)
BILIRUBIN DIRECT: 0.1 mg/dL (ref <=0.2)
BILIRUBIN INDIRECT: 0.4 mg/dL (ref 0.2–1.2)
BILIRUBIN TOTAL: 0.5 mg/dL (ref 0.2–1.2)
Total Protein: 6.4 g/dL (ref 6.1–8.1)

## 2015-10-05 LAB — TSH: TSH: 5.91 m[IU]/L — AB (ref 0.40–4.50)

## 2015-10-05 LAB — POCT INR: INR: 2

## 2015-10-05 NOTE — Patient Instructions (Signed)
Your physician recommends that you return for lab work - CBC, TSH, Liver Function   Your physician wants you to follow-up in: 6 months with Dr. Percival Spanish. You will receive a reminder letter in the mail two months in advance. If you don't receive a letter, please call our office to schedule the follow-up appointment.

## 2015-10-05 NOTE — Progress Notes (Signed)
HPI The patient presents for followup after recent visits for atrial fibrillation. Since I last saw him he has had no acute complaints. He continues to have chronic fatigue. He denies any shortness of breath, PND or orthopnea. He has no palpitations, presyncope or syncope. He's had no weight gain or edema. He has to take a lot of naps.  He's having no problems blood thinner. No evidence of bleeding other than some mild nosebleeds.    Allergies  Allergen Reactions  . Metoprolol Tartrate Other (See Comments)    Anxiety, heart rate goes too low, feels like in a fog.    Current Outpatient Prescriptions  Medication Sig Dispense Refill  . amiodarone (PACERONE) 200 MG tablet Take 1 tablet (200 mg total) by mouth daily. Starting on February 14 th 60 tablet 11  . amLODipine (NORVASC) 5 MG tablet Take 1 tablet (5 mg total) by mouth daily. 30 tablet 3  . clonazePAM (KLONOPIN) 1 MG tablet Take 1 tablet (1 mg total) by mouth at bedtime. 30 tablet 5  . cyanocobalamin (,VITAMIN B-12,) 1000 MCG/ML injection Inject 1 mL (1,000 mcg total) into the muscle as directed. 30 mL 10  . DIGESTIVE ENZYMES PO Take 1 tablet by mouth 2 (two) times daily.     . ferrous sulfate 325 (65 FE) MG tablet Take 325 mg by mouth 2 (two) times daily with a meal.    . finasteride (PROSCAR) 5 MG tablet Take 5 mg by mouth daily.  11  . fish oil-omega-3 fatty acids 1000 MG capsule Take 1 g by mouth 2 (two) times daily.     . hydroxypropyl methylcellulose (ISOPTO TEARS) 2.5 % ophthalmic solution Place 1 drop into both eyes 3 (three) times daily as needed for dry eyes.     [name] levothyroxine (SYNTHROID, LEVOTHROID) 50 MCG tablet Take 50 mcg by mouth See admin instructions. On Tuesday thursdays and saturdays patient stats he takes a full tablet then take a half along with it on these days. 1 whole tablet rest of days    . Multiple Vitamin (MULTIVITAMIN) capsule Take 1 capsule by mouth daily.    . nitroGLYCERIN (NITROSTAT) 0.4 MG SL tablet  Place 0.4 mg under the tongue every 5 (five) minutes as needed for chest pain.     . pantoprazole (PROTONIX) 40 MG tablet Take 40 mg by mouth 2 (two) times daily.     . pravastatin (PRAVACHOL) 40 MG tablet Take 40 mg by mouth every evening.    . traZODone (DESYREL) 50 MG tablet Take by mouth at bedtime as needed for sleep.     [name] warfarin (COUMADIN) 5 MG tablet Take 2.5-5 mg by mouth daily. Take 5mg  on Monday Wednesday and fridays. Then take half of the 5mg  rest of days     No current facility-administered medications for this visit.    Past Medical History  Diagnosis Date  . HYPOTHYROIDISM   . HYPERLIPIDEMIA   . DEPRESSION   . HYPERTENSION   . CORONARY ARTERY DISEASE     a. s/p stent OM 2002. b. s/p BMS 2006 RCA  c. s/p cath 2007.  [name] GERD   . DIVERTICULOSIS, COLON   . BENIGN PROSTATIC HYPERTROPHY   . GYNECOMASTIA, UNILATERAL   . SLEEP APNEA   . Hematoma of abdominal wall   . Mallory - Weiss tear     a. > 5 years ago  . Barrett's esophagus   . Popliteal artery embolism, right ([place])   . History of echocardiogram  a. EF 55% (TEE 06/2014)  . Paroxysmal a-fib (HCC)     a. chronic amiodarone and coumadin;  b. 05/2013 s/p DCCV. c. 06/2014 s/p TEE/DCCV.  [name] Chronic anticoagulation     a. on Coumadin  . Mitral regurgitation     a. Mild-mod by TEE 06/2014.  [name] Anemia     Past Surgical History  Procedure Laterality Date  . Transurethral resection of prostate    . Coronary angioplasty with stent placement    . Tee with cardioversion  7/08  . Cardioversion  8/08  . Esophagogastroduodenoscopy    . Tee without cardioversion  09/14/2011    Procedure: TRANSESOPHAGEAL ECHOCARDIOGRAM (TEE);  Surgeon: Josue Hector, MD;  Location: [place];  Service: Cardiovascular;  Laterality: N/A;  . Cardioversion  09/14/2011    Procedure: CARDIOVERSION;  Surgeon: Josue Hector, MD;  Location: [place];  Service: Cardiovascular;  Laterality: N/A;  . Cardioversion N/A 03/23/2014    Procedure:  CARDIOVERSION;  Surgeon: Thompson Grayer, MD;  Location: [place];  Service: Cardiovascular;  Laterality: N/A;  . Tee without cardioversion N/A 06/22/2014    Procedure: TRANSESOPHAGEAL ECHOCARDIOGRAM (TEE);  Surgeon: [name], MD;  Location: [place];  Service: Cardiovascular;  Laterality: N/A;  . Cardioversion N/A 06/22/2014    Procedure: CARDIOVERSION;  Surgeon: [name], MD;  Location: [facility] ENDOSCOPY;  Service: Cardiovascular;  Laterality: N/A;  . Cardioversion N/A 04/27/2015    Procedure: CARDIOVERSION;  Surgeon: Josue Hector, MD;  Location: [place];  Service: Cardiovascular;  Laterality: N/A;  . Cardioversion N/A 07/16/2015    Procedure: CARDIOVERSION;  Surgeon: [name], MD;  Location: [facility] ENDOSCOPY;  Service: Cardiovascular;  Laterality: N/A;    ROS:  As stated in the HPI and negative for all other systems.  PHYSICAL EXAM BP 128/70 mmHg  Pulse 68  Ht 5\' 10"  (1.778 m)  Wt 190 lb (86.183 kg)  BMI 27.26 kg/m2 GENERAL: No acute distress NECK:  No jugular venous distention, waveform within normal limits, carotid upstroke brisk and symmetric, no bruits, no thyromegaly LUNGS:  Clear to auscultation bilaterally BACK:  No CVA tenderness CHEST:  Unremarkable HEART:  PMI not displaced or sustained,S1 and S2 within normal limits, no S3, no S4, no clicks, no rubs, apical and axillary late systolic murmur ABD:  Flat, positive bowel sounds normal in frequency in pitch, no bruits, no rebound, no guarding, no midline pulsatile mass, no hepatomegaly, no splenomegaly EXT:  2 plus pulses throughout, no edema, no cyanosis no clubbing   ASSESSMENT AND PLAN  ATRIAL FIBRILLATION - He has had No recurrent dysrhythmia. I will check TSH and liver profile. He's tolerating anticoagulation.  BETA BLOCKER INTOLERANCE - This was added to his list of allergies.  CORONARY ARTERY DISEASE -  The patient has no new sypmtoms.  No further cardiovascular testing is indicated.  We will  continue with aggressive risk reduction and meds as listed.    HYPERTENSION -  The blood pressure is at target. No change in medications is indicated. We will continue with therapeutic lifestyle changes (TLC).  MITRAL REGURGITATION -  This was moderate on the last echo and we will follow this clinically.   FATIGUE - Unchanged

## 2015-10-12 ENCOUNTER — Telehealth: Payer: Self-pay | Admitting: *Deleted

## 2015-10-12 DIAGNOSIS — R5382 Chronic fatigue, unspecified: Secondary | ICD-10-CM

## 2015-10-12 DIAGNOSIS — E039 Hypothyroidism, unspecified: Secondary | ICD-10-CM | POA: Diagnosis not present

## 2015-10-12 NOTE — Telephone Encounter (Signed)
-----   Message from Minus Breeding, MD sent at 10/10/2015  6:46 PM EDT ----- TSH is slightly increased.  Check T3 and T4

## 2015-10-12 NOTE — Telephone Encounter (Signed)
Spoke with pt about his blood work, T3 and T4 was ordered, pt will come by at office to pick up lab slip to get check.

## 2015-10-13 LAB — T4, FREE: Free T4: 1.6 ng/dL (ref 0.8–1.8)

## 2015-10-13 LAB — T3, FREE: T3 FREE: 2.3 pg/mL (ref 2.3–4.2)

## 2015-10-27 ENCOUNTER — Ambulatory Visit: Payer: Medicare Other | Admitting: Internal Medicine

## 2015-11-02 ENCOUNTER — Ambulatory Visit: Payer: Medicare Other | Admitting: Internal Medicine

## 2015-11-02 ENCOUNTER — Ambulatory Visit (INDEPENDENT_AMBULATORY_CARE_PROVIDER_SITE_OTHER): Payer: Medicare Other | Admitting: Internal Medicine

## 2015-11-02 ENCOUNTER — Encounter: Payer: Self-pay | Admitting: Internal Medicine

## 2015-11-02 ENCOUNTER — Ambulatory Visit (INDEPENDENT_AMBULATORY_CARE_PROVIDER_SITE_OTHER): Payer: Medicare Other | Admitting: Pharmacist Clinician (PhC)/ Clinical Pharmacy Specialist

## 2015-11-02 VITALS — BP 156/90 | HR 56 | Temp 98.0°F | Resp 20 | Ht 70.0 in | Wt 191.0 lb

## 2015-11-02 DIAGNOSIS — I4891 Unspecified atrial fibrillation: Secondary | ICD-10-CM | POA: Diagnosis not present

## 2015-11-02 DIAGNOSIS — I10 Essential (primary) hypertension: Secondary | ICD-10-CM

## 2015-11-02 DIAGNOSIS — E039 Hypothyroidism, unspecified: Secondary | ICD-10-CM

## 2015-11-02 DIAGNOSIS — I480 Paroxysmal atrial fibrillation: Secondary | ICD-10-CM | POA: Diagnosis not present

## 2015-11-02 DIAGNOSIS — Z7901 Long term (current) use of anticoagulants: Secondary | ICD-10-CM | POA: Diagnosis not present

## 2015-11-02 DIAGNOSIS — Z5181 Encounter for therapeutic drug level monitoring: Secondary | ICD-10-CM | POA: Diagnosis not present

## 2015-11-02 LAB — POCT INR: INR: 2.7

## 2015-11-02 NOTE — Progress Notes (Signed)
Subjective:    Patient ID: [name], male    DOB: 03/22/1927, 82 y.o.   MRN: [id number]  HPI  82 year old patient who is seen today for his biannual follow-up.  He is followed closely by cardiology. He has a history of paroxysmal atrial fibrillation and remains on amiodarone.  He has had  Several episodes of PAF over the past several months.  Remains on chronic anticoagulation Complaints today include weakness and fatigue which have been chronic.  He states that he takes more frequent naps.  He does require a walker now for ambulation Recent lab reviewed.  This included a TSH  Which was mildly elevated but free T3 and free T4 normal.  CBC reviewed.  No anemia. He has essential hypertension No new concerns or complaints  Past Medical History  Diagnosis Date  . HYPOTHYROIDISM   . HYPERLIPIDEMIA   . DEPRESSION   . HYPERTENSION   . CORONARY ARTERY DISEASE     a. s/p stent OM 2002. b. s/p BMS 2006 RCA  c. s/p cath 2007.  [name] GERD   . DIVERTICULOSIS, COLON   . BENIGN PROSTATIC HYPERTROPHY   . GYNECOMASTIA, UNILATERAL   . SLEEP APNEA     CPAP  . Hematoma of abdominal wall   . Mallory - Weiss tear     a. > 5 years ago  . Barrett's esophagus   . Popliteal artery embolism, right ([place])   . History of echocardiogram     a. EF 55% (TEE 06/2014)  . Paroxysmal a-fib (HCC)     a. chronic amiodarone and coumadin;  b. 05/2013 s/p DCCV. c. 06/2014 s/p TEE/DCCV.  [name] Chronic anticoagulation     a. on Coumadin  . Mitral regurgitation     a. Mild-mod by TEE 06/2014.  [name] Anemia      Social History   Social History  . Marital Status: Married    Spouse Name: N/A  . Number of Children: 1  . Years of Education: N/A   Occupational History  . Retired    Social History Main Topics  . Smoking status: Never Smoker   . Smokeless tobacco: Never Used  . Alcohol Use: No  . Drug Use: No  . Sexual Activity: Not on file   Other Topics Concern  . Not on file   Social History Narrative   He  is retired from [employer]scape [employer].   Mother lived into her 37's.  Father died in his 60's.  No CAD.    Past Surgical History  Procedure Laterality Date  . Transurethral resection of prostate    . Coronary angioplasty with stent placement    . Tee with cardioversion  7/08  . Cardioversion  8/08  . Esophagogastroduodenoscopy    . Tee without cardioversion  09/14/2011    Procedure: TRANSESOPHAGEAL ECHOCARDIOGRAM (TEE);  Surgeon: Josue Hector, MD;  Location: [place];  Service: Cardiovascular;  Laterality: N/A;  . Cardioversion  09/14/2011    Procedure: CARDIOVERSION;  Surgeon: Josue Hector, MD;  Location: [place];  Service: Cardiovascular;  Laterality: N/A;  . Cardioversion N/A 03/23/2014    Procedure: CARDIOVERSION;  Surgeon: Thompson Grayer, MD;  Location: [place];  Service: Cardiovascular;  Laterality: N/A;  . Tee without cardioversion N/A 06/22/2014    Procedure: TRANSESOPHAGEAL ECHOCARDIOGRAM (TEE);  Surgeon: [name], MD;  Location: [facility] ENDOSCOPY;  Service: Cardiovascular;  Laterality: N/A;  . Cardioversion N/A 06/22/2014    Procedure: CARDIOVERSION;  Surgeon: [name], MD;  Location: [place] ENDOSCOPY;  Service: Cardiovascular;  Laterality: N/A;  . Cardioversion N/A 04/27/2015    Procedure: CARDIOVERSION;  Surgeon: Josue Hector, MD;  Location: [place];  Service: Cardiovascular;  Laterality: N/A;  . Cardioversion N/A 07/16/2015    Procedure: CARDIOVERSION;  Surgeon: [name], MD;  Location: [facility] ENDOSCOPY;  Service: Cardiovascular;  Laterality: N/A;    Family History  Problem Relation Age of Onset  . Hypertension Other   . Heart disease Brother     CAD male 1st degree relative  . Heart attack Mother   . Stroke Neg Hx     Allergies  Allergen Reactions  . Metoprolol Tartrate Other (See Comments)    Anxiety, heart rate goes too low, feels like in a fog.    Current Outpatient Prescriptions on File Prior to Visit  Medication Sig Dispense Refill  .  amiodarone (PACERONE) 200 MG tablet Take 1 tablet (200 mg total) by mouth daily. Starting on February 14 th 60 tablet 11  . amLODipine (NORVASC) 5 MG tablet Take 1 tablet (5 mg total) by mouth daily. 30 tablet 3  . clonazePAM (KLONOPIN) 1 MG tablet Take 1 tablet (1 mg total) by mouth at bedtime. 30 tablet 5  . cyanocobalamin (,VITAMIN B-12,) 1000 MCG/ML injection Inject 1 mL (1,000 mcg total) into the muscle as directed. 30 mL 10  . DIGESTIVE ENZYMES PO Take 1 tablet by mouth 2 (two) times daily.     . ferrous sulfate 325 (65 FE) MG tablet Take 325 mg by mouth 2 (two) times daily with a meal.    . finasteride (PROSCAR) 5 MG tablet Take 5 mg by mouth daily.  11  . fish oil-omega-3 fatty acids 1000 MG capsule Take 1 g by mouth 2 (two) times daily.     . hydroxypropyl methylcellulose (ISOPTO TEARS) 2.5 % ophthalmic solution Place 1 drop into both eyes 3 (three) times daily as needed for dry eyes.     [name] levothyroxine (SYNTHROID, LEVOTHROID) 50 MCG tablet Take 50 mcg by mouth See admin instructions. On Tuesday thursdays and saturdays patient stats he takes a full tablet then take a half along with it on these days. 1 whole tablet rest of days    . Multiple Vitamin (MULTIVITAMIN) capsule Take 1 capsule by mouth daily.    . nitroGLYCERIN (NITROSTAT) 0.4 MG SL tablet Place 0.4 mg under the tongue every 5 (five) minutes as needed for chest pain.     . pantoprazole (PROTONIX) 40 MG tablet Take 40 mg by mouth 2 (two) times daily.     . pravastatin (PRAVACHOL) 40 MG tablet Take 40 mg by mouth every evening.    . traZODone (DESYREL) 50 MG tablet Take by mouth at bedtime as needed for sleep.     [name] warfarin (COUMADIN) 5 MG tablet Take 2.5-5 mg by mouth daily. Take 5mg  on Monday Wednesday and fridays. Then take half of the 5mg  rest of days     No current facility-administered medications on file prior to visit.    BP 156/90 mmHg  Pulse 56  Temp(Src) 98 F (36.7 C) (Oral)  Resp 20  Ht 5\' 10"  (1.778 m)  Wt  191 lb (86.637 kg)  BMI 27.41 kg/m2  SpO2 98%     Review of Systems  Constitutional: Positive for fatigue. Negative for fever, chills and appetite change.  HENT: Negative for congestion, dental problem, ear pain, hearing loss, sore throat, tinnitus, trouble swallowing and voice change.   Eyes: Negative  for pain, discharge and visual disturbance.  Respiratory: Negative for cough, chest tightness, wheezing and stridor.   Cardiovascular: Negative for chest pain, palpitations and leg swelling.  Gastrointestinal: Negative for nausea, vomiting, abdominal pain, diarrhea, constipation, blood in stool and abdominal distention.  Genitourinary: Negative for urgency, hematuria, flank pain, discharge, difficulty urinating and genital sores.  Musculoskeletal: Positive for gait problem. Negative for myalgias, back pain, joint swelling, arthralgias and neck stiffness.  Skin: Negative for rash.  Neurological: Positive for weakness. Negative for dizziness, syncope, speech difficulty, numbness and headaches.  Hematological: Negative for adenopathy. Does not bruise/bleed easily.  Psychiatric/Behavioral: Negative for behavioral problems and dysphoric mood. The patient is not nervous/anxious.        Objective:   Physical Exam  Constitutional: He is oriented to person, place, and time. He appears well-developed.  Walks with a cane Repeat blood pressure 130/70  HENT:  Head: Normocephalic.  Right Ear: External ear normal.  Left Ear: External ear normal.  Eyes: Conjunctivae and EOM are normal.  Neck: Normal range of motion.  Cardiovascular: Normal rate, regular rhythm and normal heart sounds.   Pulmonary/Chest: Breath sounds normal.  Abdominal: Bowel sounds are normal.  Musculoskeletal: Normal range of motion. He exhibits no edema or tenderness.  Neurological: He is alert and oriented to person, place, and time.  Psychiatric: He has a normal mood and affect. His behavior is normal.            Assessment & Plan:   Paroxysmal atrial fibrillation Chronic Coumadin anticoagulation Essential hypertension, stable Chronic fatigue History depression Hypothyroidism  No change in therapy Patient will attempt to  be more active with more frequent walks Follow-up cardiology Return here 6 months or as needed

## 2015-11-02 NOTE — Progress Notes (Signed)
Pre visit review using our clinic review tool, if applicable. No additional management support is needed unless otherwise documented below in the visit note. 

## 2015-11-02 NOTE — Patient Instructions (Signed)
Limit your sodium (Salt) intake    It is important that you exercise regularly (walk with walker) , at least 20 minutes 3 to 4 times per week.  If you develop chest pain or shortness of breath seek  medical attention.  Return in 6 months for follow-up

## 2015-11-07 DIAGNOSIS — M5489 Other dorsalgia: Secondary | ICD-10-CM | POA: Diagnosis not present

## 2015-11-08 ENCOUNTER — Ambulatory Visit
Admission: RE | Admit: 2015-11-08 | Discharge: 2015-11-08 | Disposition: A | Discharge status: Home / Self Care | Payer: Medicare Other | Source: Ambulatory Visit | Attending: Internal Medicine | Admitting: Internal Medicine

## 2015-11-08 ENCOUNTER — Ambulatory Visit (INDEPENDENT_AMBULATORY_CARE_PROVIDER_SITE_OTHER): Payer: Medicare Other | Admitting: Internal Medicine

## 2015-11-08 ENCOUNTER — Encounter: Payer: Self-pay | Admitting: Internal Medicine

## 2015-11-08 DIAGNOSIS — D500 Iron deficiency anemia secondary to blood loss (chronic): Secondary | ICD-10-CM | POA: Diagnosis not present

## 2015-11-08 DIAGNOSIS — R109 Unspecified abdominal pain: Secondary | ICD-10-CM

## 2015-11-08 DIAGNOSIS — D509 Iron deficiency anemia, unspecified: Secondary | ICD-10-CM

## 2015-11-08 DIAGNOSIS — I480 Paroxysmal atrial fibrillation: Secondary | ICD-10-CM

## 2015-11-08 DIAGNOSIS — N281 Cyst of kidney, acquired: Secondary | ICD-10-CM | POA: Diagnosis not present

## 2015-11-08 DIAGNOSIS — Z7901 Long term (current) use of anticoagulants: Secondary | ICD-10-CM

## 2015-11-08 MED ORDER — HYDROCODONE-ACETAMINOPHEN 5-325 MG PO TABS: 1.0000 | ORAL_TABLET | Freq: Four times a day (QID) | ORAL | Status: DC | PRN

## 2015-11-08 NOTE — Progress Notes (Signed)
Subjective:    Patient ID: [name], male    DOB: 04-09-1927, 82 y.o.   MRN: [id number]  HPI  .  82 year old patient who has a history of atrial fibrillation and has been on chronic Coumadin anticoagulation.  INR 6 days ago was 2.7 over the past several days she's had increasing right flank pain.  Pain is aggravated by movement.  He does have a prior history of kidney stones.  He also has a prior history of hemorrhage into the left rectus musculature  Past Medical History  Diagnosis Date  . HYPOTHYROIDISM   . HYPERLIPIDEMIA   . DEPRESSION   . HYPERTENSION   . CORONARY ARTERY DISEASE     a. s/p stent OM 2002. b. s/p BMS 2006 RCA  c. s/p cath 2007.  [name] GERD   . DIVERTICULOSIS, COLON   . BENIGN PROSTATIC HYPERTROPHY   . GYNECOMASTIA, UNILATERAL   . SLEEP APNEA     CPAP  . Hematoma of abdominal wall   . Mallory - Weiss tear     a. > 5 years ago  . Barrett's esophagus   . Popliteal artery embolism, right ([place])   . History of echocardiogram     a. EF 55% (TEE 06/2014)  . Paroxysmal a-fib (HCC)     a. chronic amiodarone and coumadin;  b. 05/2013 s/p DCCV. c. 06/2014 s/p TEE/DCCV.  [name] Chronic anticoagulation     a. on Coumadin  . Mitral regurgitation     a. Mild-mod by TEE 06/2014.  [name] Anemia      Social History   Social History  . Marital Status: Married    Spouse Name: N/A  . Number of Children: 1  . Years of Education: N/A   Occupational History  . Retired    Social History Main Topics  . Smoking status: Never Smoker   . Smokeless tobacco: Never Used  . Alcohol Use: No  . Drug Use: No  . Sexual Activity: Not on file   Other Topics Concern  . Not on file   Social History Narrative   He is retired from Landscape [employer].   Mother lived into her 88's.  Father died in his 58's.  No CAD.    Past Surgical History  Procedure Laterality Date  . Transurethral resection of prostate    . Coronary angioplasty with stent placement    . Tee with cardioversion  7/08    . Cardioversion  8/08  . Esophagogastroduodenoscopy    . Tee without cardioversion  09/14/2011    Procedure: TRANSESOPHAGEAL ECHOCARDIOGRAM (TEE);  Surgeon: Josue Hector, MD;  Location: [place];  Service: Cardiovascular;  Laterality: N/A;  . Cardioversion  09/14/2011    Procedure: CARDIOVERSION;  Surgeon: Josue Hector, MD;  Location: [place];  Service: Cardiovascular;  Laterality: N/A;  . Cardioversion N/A 03/23/2014    Procedure: CARDIOVERSION;  Surgeon: Thompson Grayer, MD;  Location: [place];  Service: Cardiovascular;  Laterality: N/A;  . Tee without cardioversion N/A 06/22/2014    Procedure: TRANSESOPHAGEAL ECHOCARDIOGRAM (TEE);  Surgeon: [name], MD;  Location: [facility] ENDOSCOPY;  Service: Cardiovascular;  Laterality: N/A;  . Cardioversion N/A 06/22/2014    Procedure: CARDIOVERSION;  Surgeon: [name], MD;  Location: [facility] ENDOSCOPY;  Service: Cardiovascular;  Laterality: N/A;  . Cardioversion N/A 04/27/2015    Procedure: CARDIOVERSION;  Surgeon: Josue Hector, MD;  Location: [place];  Service: Cardiovascular;  Laterality: N/A;  . Cardioversion N/A 07/16/2015  Procedure: CARDIOVERSION;  Surgeon: [name], MD;  Location: [facility] ENDOSCOPY;  Service: Cardiovascular;  Laterality: N/A;    Family History  Problem Relation Age of Onset  . Hypertension Other   . Heart disease Brother     CAD male 1st degree relative  . Heart attack Mother   . Stroke Neg Hx     Allergies  Allergen Reactions  . Metoprolol Tartrate Other (See Comments)    Anxiety, heart rate goes too low, feels like in a fog.    Current Outpatient Prescriptions on File Prior to Visit  Medication Sig Dispense Refill  . amiodarone (PACERONE) 200 MG tablet Take 1 tablet (200 mg total) by mouth daily. Starting on February 14 th 60 tablet 11  . amLODipine (NORVASC) 5 MG tablet Take 1 tablet (5 mg total) by mouth daily. 30 tablet 3  . clonazePAM (KLONOPIN) 1 MG tablet Take 1 tablet (1 mg total) by  mouth at bedtime. 30 tablet 5  . cyanocobalamin (,VITAMIN B-12,) 1000 MCG/ML injection Inject 1 mL (1,000 mcg total) into the muscle as directed. 30 mL 10  . DIGESTIVE ENZYMES PO Take 1 tablet by mouth 2 (two) times daily.     . ferrous sulfate 325 (65 FE) MG tablet Take 325 mg by mouth 2 (two) times daily with a meal.    . finasteride (PROSCAR) 5 MG tablet Take 5 mg by mouth daily.  11  . fish oil-omega-3 fatty acids 1000 MG capsule Take 1 g by mouth 2 (two) times daily.     . hydroxypropyl methylcellulose (ISOPTO TEARS) 2.5 % ophthalmic solution Place 1 drop into both eyes 3 (three) times daily as needed for dry eyes.     [name] levothyroxine (SYNTHROID, LEVOTHROID) 50 MCG tablet Take 50 mcg by mouth See admin instructions. On Tuesday thursdays and saturdays patient stats he takes a full tablet then take a half along with it on these days. 1 whole tablet rest of days    . Multiple Vitamin (MULTIVITAMIN) capsule Take 1 capsule by mouth daily.    . nitroGLYCERIN (NITROSTAT) 0.4 MG SL tablet Place 0.4 mg under the tongue every 5 (five) minutes as needed for chest pain.     . pantoprazole (PROTONIX) 40 MG tablet Take 40 mg by mouth 2 (two) times daily.     . pravastatin (PRAVACHOL) 40 MG tablet Take 40 mg by mouth every evening.    . traZODone (DESYREL) 50 MG tablet Take by mouth at bedtime as needed for sleep.     [name] warfarin (COUMADIN) 5 MG tablet Take 2.5-5 mg by mouth daily. Take 5mg  on Monday Wednesday and fridays. Then take half of the 5mg  rest of days     No current facility-administered medications on file prior to visit.    BP 158/90 mmHg  Pulse 57  Temp(Src) 97.8 F (36.6 C) (Oral)  Resp 20  Ht 5\' 10"  (1.778 m)  Wt 192 lb (87.091 kg)  BMI 27.55 kg/m2  SpO2 98%     Review of Systems  Constitutional: Negative for fever, chills, appetite change and fatigue.  HENT: Negative for congestion, dental problem, ear pain, hearing loss, sore throat, tinnitus, trouble swallowing and voice  change.   Eyes: Negative for pain, discharge and visual disturbance.  Respiratory: Negative for cough, chest tightness, wheezing and stridor.   Cardiovascular: Negative for chest pain, palpitations and leg swelling.  Gastrointestinal: Negative for nausea, vomiting, abdominal pain, diarrhea, constipation, blood in stool and abdominal distention.  Genitourinary:  Positive for flank pain. Negative for urgency, hematuria, discharge, difficulty urinating and genital sores.  Musculoskeletal: Negative for myalgias, back pain, joint swelling, arthralgias, gait problem and neck stiffness.  Skin: Negative for rash.  Neurological: Negative for dizziness, syncope, speech difficulty, weakness, numbness and headaches.  Hematological: Negative for adenopathy. Does not bruise/bleed easily.  Psychiatric/Behavioral: Negative for behavioral problems and dysphoric mood. The patient is not nervous/anxious.        Objective:   Physical Exam  Constitutional: He appears well-developed and well-nourished.  Quite uncomfortable Requires assistance with transferring from a sitting position to the examining table  Pulmonary/Chest:  Mild tenderness in the right flank area.  No ecchymoses  Does not seem tenderness over the greater trochanter or ischium  Patient had difficulty laying supine but  range of motion of the right hip appeared to be intact  Abdominal: Soft. Bowel sounds are normal.          Assessment & Plan:   .  Right flank pain, unclear etiology.  Rule out retroperitoneal hemorrhage.  Will check a CBC and noncontrast abdominal/ pelvic CT scan .  New prescription for Vicodin , chronic atrial fibrillation , chronic anticoagulation  .  Will hold at this time until imaging studies complete

## 2015-11-08 NOTE — Patient Instructions (Signed)
.    Hold Coumadin until further notice  take pain medications as directed .  CT scan as scheduled

## 2015-11-08 NOTE — Progress Notes (Signed)
Pre visit review using our clinic review tool, if applicable. No additional management support is needed unless otherwise documented below in the visit note. 

## 2015-11-09 LAB — CBC WITH DIFFERENTIAL/PLATELET
BASOS ABS: 0 10*3/uL (ref 0.0–0.1)
Basophils Relative: 0.3 % (ref 0.0–3.0)
EOS ABS: 0.1 10*3/uL (ref 0.0–0.7)
Eosinophils Relative: 0.8 % (ref 0.0–5.0)
HEMATOCRIT: 40.1 % (ref 39.0–52.0)
HEMOGLOBIN: 13.5 g/dL (ref 13.0–17.0)
LYMPHS PCT: 16.4 % (ref 12.0–46.0)
Lymphs Abs: 1.1 10*3/uL (ref 0.7–4.0)
MCHC: 33.6 g/dL (ref 30.0–36.0)
MCV: 97.6 fl (ref 78.0–100.0)
MONO ABS: 0.6 10*3/uL (ref 0.1–1.0)
Monocytes Relative: 9.4 % (ref 3.0–12.0)
NEUTROS ABS: 4.9 10*3/uL (ref 1.4–7.7)
Neutrophils Relative %: 73.1 % (ref 43.0–77.0)
PLATELETS: 168 10*3/uL (ref 150.0–400.0)
RBC: 4.11 Mil/uL — ABNORMAL LOW (ref 4.22–5.81)
RDW: 15 % (ref 11.5–15.5)
WBC: 6.7 10*3/uL (ref 4.0–10.5)

## 2015-11-17 ENCOUNTER — Telehealth: Payer: Self-pay | Admitting: Cardiology

## 2015-11-17 NOTE — Telephone Encounter (Signed)
[name] called in requesting the office note from 2/7 that advised the pt to change his dosage for his Amiodarone from 200mg  BID to 200mg  po once daily(pt gets medications filled at the [place]). Please f/u with [name] . This can be faxed to the office at [phone].

## 2015-11-17 NOTE — Telephone Encounter (Signed)
Called [facility] and confirmed correct fax number. Office note faxed to dr Lamont Snowball.

## 2015-11-22 ENCOUNTER — Ambulatory Visit (HOSPITAL_COMMUNITY)
Admission: RE | Admit: 2015-11-22 | Discharge: 2015-11-22 | Disposition: A | Discharge status: Home / Self Care | Payer: Medicare Other | Source: Ambulatory Visit | Attending: Nurse Practitioner | Admitting: Nurse Practitioner

## 2015-11-22 ENCOUNTER — Encounter (HOSPITAL_COMMUNITY): Payer: Self-pay | Admitting: Nurse Practitioner

## 2015-11-22 VITALS — BP 118/60 | HR 117 | Ht 70.0 in | Wt 182.2 lb

## 2015-11-22 DIAGNOSIS — E039 Hypothyroidism, unspecified: Secondary | ICD-10-CM | POA: Diagnosis not present

## 2015-11-22 DIAGNOSIS — K5790 Diverticulosis of intestine, part unspecified, without perforation or abscess without bleeding: Secondary | ICD-10-CM | POA: Insufficient documentation

## 2015-11-22 DIAGNOSIS — K2270 Barrett's esophagus without dysplasia: Secondary | ICD-10-CM | POA: Insufficient documentation

## 2015-11-22 DIAGNOSIS — K219 Gastro-esophageal reflux disease without esophagitis: Secondary | ICD-10-CM | POA: Insufficient documentation

## 2015-11-22 DIAGNOSIS — I10 Essential (primary) hypertension: Secondary | ICD-10-CM | POA: Insufficient documentation

## 2015-11-22 DIAGNOSIS — Z7901 Long term (current) use of anticoagulants: Secondary | ICD-10-CM | POA: Insufficient documentation

## 2015-11-22 DIAGNOSIS — E785 Hyperlipidemia, unspecified: Secondary | ICD-10-CM | POA: Diagnosis not present

## 2015-11-22 DIAGNOSIS — I480 Paroxysmal atrial fibrillation: Secondary | ICD-10-CM | POA: Insufficient documentation

## 2015-11-22 DIAGNOSIS — G4730 Sleep apnea, unspecified: Secondary | ICD-10-CM | POA: Insufficient documentation

## 2015-11-22 DIAGNOSIS — I340 Nonrheumatic mitral (valve) insufficiency: Secondary | ICD-10-CM | POA: Diagnosis not present

## 2015-11-22 DIAGNOSIS — N400 Enlarged prostate without lower urinary tract symptoms: Secondary | ICD-10-CM | POA: Insufficient documentation

## 2015-11-22 DIAGNOSIS — F329 Major depressive disorder, single episode, unspecified: Secondary | ICD-10-CM | POA: Insufficient documentation

## 2015-11-22 DIAGNOSIS — Z0189 Encounter for other specified special examinations: Secondary | ICD-10-CM | POA: Diagnosis present

## 2015-11-22 DIAGNOSIS — I2510 Atherosclerotic heart disease of native coronary artery without angina pectoris: Secondary | ICD-10-CM | POA: Diagnosis not present

## 2015-11-22 DIAGNOSIS — D649 Anemia, unspecified: Secondary | ICD-10-CM | POA: Diagnosis not present

## 2015-11-22 LAB — COMPREHENSIVE METABOLIC PANEL
ALT: 25 U/L (ref 17–63)
ANION GAP: 9 (ref 5–15)
AST: 33 U/L (ref 15–41)
Albumin: 3.6 g/dL (ref 3.5–5.0)
Alkaline Phosphatase: 73 U/L (ref 38–126)
BILIRUBIN TOTAL: 0.7 mg/dL (ref 0.3–1.2)
BUN: 16 mg/dL (ref 6–20)
CO2: 24 mmol/L (ref 22–32)
Calcium: 9.3 mg/dL (ref 8.9–10.3)
Chloride: 107 mmol/L (ref 101–111)
Creatinine, Ser: 1.25 mg/dL — ABNORMAL HIGH (ref 0.61–1.24)
GFR calc Af Amer: 58 mL/min — ABNORMAL LOW (ref >60)
GFR, EST NON AFRICAN AMERICAN: 50 mL/min — AB (ref >60)
Glucose, Bld: 105 mg/dL — ABNORMAL HIGH (ref 65–99)
POTASSIUM: 4.3 mmol/L (ref 3.5–5.1)
Sodium: 140 mmol/L (ref 135–145)
TOTAL PROTEIN: 6.6 g/dL (ref 6.5–8.1)

## 2015-11-22 LAB — CBC
HEMATOCRIT: 42.2 % (ref 39.0–52.0)
Hemoglobin: 14 g/dL (ref 13.0–17.0)
MCH: 33.1 pg (ref 26.0–34.0)
MCHC: 33.2 g/dL (ref 30.0–36.0)
MCV: 99.8 fL (ref 78.0–100.0)
Platelets: 204 10*3/uL (ref 150–400)
RBC: 4.23 MIL/uL (ref 4.22–5.81)
RDW: 14.6 % (ref 11.5–15.5)
WBC: 6.7 10*3/uL (ref 4.0–10.5)

## 2015-11-22 LAB — TSH: TSH: 4.049 u[IU]/mL (ref 0.350–4.500)

## 2015-11-22 LAB — PROTIME-INR
INR: 2.39 — ABNORMAL HIGH (ref 0.00–1.49)
PROTHROMBIN TIME: 25.8 s — AB (ref 11.6–15.2)

## 2015-11-22 MED ORDER — AMIODARONE HCL 200 MG PO TABS: 200.0000 mg | ORAL_TABLET | Freq: Two times a day (BID) | ORAL | Status: DC

## 2015-11-22 NOTE — Patient Instructions (Signed)
Your physician has recommended you make the following change in your medication:  1)Increase Amiodarone to 200mg  twice a day

## 2015-11-22 NOTE — Progress Notes (Signed)
Patient ID: [name], male   DOB: 02-Oct-1926, 82 y.o.   MRN: [id number]     Primary Care Physician: Kevin Cowden, MD Referring Physician: Areatha [name] triage Cardiologist: Dr. Laretta [name] [name] is a 82 y.o. male with a h/o PAF, that went into afib with rvr this am. He does not feel well in afib with weakness. He usually requires cardioversion to get back in SR. He generally runs a soft BP. His last INR was therapeutic was 2 weeks ago, but before that last one was March 21 st,which was therapeutic, but the INR in 2/16 was low at 1.9. Explained to pt would need 3-4 weeks of therapeutic INR's, and he is not excited about waiting to have DCCV., but can have done earlier with TEE. Since he has not even been in afib x 12 hours, will increase amiodrone to 200 mg bid and bring back on Wednesday. INR today. No known trigger.  Today, he denies symptoms of palpitations, chest pain, shortness of breath, orthopnea, PND, lower extremity edema, dizziness, presyncope, syncope, or neurologic sequela. The patient is tolerating medications without difficulties and is otherwise without complaint today.   Past Medical History  Diagnosis Date  . HYPOTHYROIDISM   . HYPERLIPIDEMIA   . DEPRESSION   . HYPERTENSION   . CORONARY ARTERY DISEASE     a. s/p stent OM 2002. b. s/p BMS 2006 RCA  c. s/p cath 2007.  [name] GERD   . DIVERTICULOSIS, COLON   . BENIGN PROSTATIC HYPERTROPHY   . GYNECOMASTIA, UNILATERAL   . SLEEP APNEA     CPAP  . Hematoma of abdominal wall   . Mallory - Weiss tear     a. > 5 years ago  . Barrett's esophagus   . Popliteal artery embolism, right ([place])   . History of echocardiogram     a. EF 55% (TEE 06/2014)  . Paroxysmal a-fib (HCC)     a. chronic amiodarone and coumadin;  b. 05/2013 s/p DCCV. c. 06/2014 s/p TEE/DCCV.  [name] Chronic anticoagulation     a. on Coumadin  . Mitral regurgitation     a. Mild-mod by TEE 06/2014.  [name] Anemia    Past Surgical History  Procedure  Laterality Date  . Transurethral resection of prostate    . Coronary angioplasty with stent placement    . Tee with cardioversion  7/08  . Cardioversion  8/08  . Esophagogastroduodenoscopy    . Tee without cardioversion  09/14/2011    Procedure: TRANSESOPHAGEAL ECHOCARDIOGRAM (TEE);  Surgeon: Josue Hector, MD;  Location: [place];  Service: Cardiovascular;  Laterality: N/A;  . Cardioversion  09/14/2011    Procedure: CARDIOVERSION;  Surgeon: Josue Hector, MD;  Location: [place];  Service: Cardiovascular;  Laterality: N/A;  . Cardioversion N/A 03/23/2014    Procedure: CARDIOVERSION;  Surgeon: Thompson Grayer, MD;  Location: [place];  Service: Cardiovascular;  Laterality: N/A;  . Tee without cardioversion N/A 06/22/2014    Procedure: TRANSESOPHAGEAL ECHOCARDIOGRAM (TEE);  Surgeon: [name], MD;  Location: [place];  Service: Cardiovascular;  Laterality: N/A;  . Cardioversion N/A 06/22/2014    Procedure: CARDIOVERSION;  Surgeon: [name], MD;  Location: [facility] ENDOSCOPY;  Service: Cardiovascular;  Laterality: N/A;  . Cardioversion N/A 04/27/2015    Procedure: CARDIOVERSION;  Surgeon: Josue Hector, MD;  Location: [place];  Service: Cardiovascular;  Laterality: N/A;  . Cardioversion N/A 07/16/2015    Procedure: CARDIOVERSION;  Surgeon: [name], MD;  Location: [facility] ENDOSCOPY;  Service: Cardiovascular;  Laterality: N/A;    Current Outpatient Prescriptions  Medication Sig Dispense Refill  . amiodarone (PACERONE) 200 MG tablet Take 1 tablet (200 mg total) by mouth 2 (two) times daily. Starting on February 14 th 60 tablet 11  . amLODipine (NORVASC) 5 MG tablet Take 1 tablet (5 mg total) by mouth daily. 30 tablet 3  . clonazePAM (KLONOPIN) 1 MG tablet Take 1 tablet (1 mg total) by mouth at bedtime. 30 tablet 5  . DIGESTIVE ENZYMES PO Take 1 tablet by mouth 2 (two) times daily.     . ferrous sulfate 325 (65 FE) MG tablet Take 325 mg by mouth 2 (two) times daily with a  meal.    . finasteride (PROSCAR) 5 MG tablet Take 5 mg by mouth daily.  11  . fish oil-omega-3 fatty acids 1000 MG capsule Take 1 g by mouth 2 (two) times daily.     [name] levothyroxine (SYNTHROID, LEVOTHROID) 50 MCG tablet Take 50 mcg by mouth See admin instructions. On Tuesday thursdays and saturdays patient stats he takes a full tablet then take a half along with it on these days. 1 whole tablet rest of days    . Multiple Vitamin (MULTIVITAMIN) capsule Take 1 capsule by mouth daily.    . pantoprazole (PROTONIX) 40 MG tablet Take 40 mg by mouth 2 (two) times daily.     . pravastatin (PRAVACHOL) 40 MG tablet Take 40 mg by mouth every evening.    . traZODone (DESYREL) 50 MG tablet Take by mouth at bedtime as needed for sleep.     [name] warfarin (COUMADIN) 5 MG tablet Take 2.5-5 mg by mouth daily. Take 5mg  on Monday Wednesday and fridays. Then take half of the 5mg  rest of days    . hydroxypropyl methylcellulose (ISOPTO TEARS) 2.5 % ophthalmic solution Place 1 drop into both eyes 3 (three) times daily as needed for dry eyes.     . nitroGLYCERIN (NITROSTAT) 0.4 MG SL tablet Place 0.4 mg under the tongue every 5 (five) minutes as needed for chest pain.      No current facility-administered medications for this encounter.    Allergies  Allergen Reactions  . Metoprolol Tartrate Other (See Comments)    Anxiety, heart rate goes too low, feels like in a fog.    Social History   Social History  . Marital Status: Married    Spouse Name: N/A  . Number of Children: 1  . Years of Education: N/A   Occupational History  . Retired    Social History Main Topics  . Smoking status: Never Smoker   . Smokeless tobacco: Never Used  . Alcohol Use: No  . Drug Use: No  . Sexual Activity: Not on file   Other Topics Concern  . Not on file   Social History Narrative   He is retired from Landscape [employer].   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  Father died in his 25's.  No CAD.    Family History  Problem Relation  Age of Onset  . Hypertension Other   . Heart disease Brother     CAD male 1st degree relative  . Heart attack Mother   . Stroke Neg Hx     ROS- All systems are reviewed and negative except as per the HPI above  Physical Exam: Filed Vitals:   11/22/15 1446  BP: 118/60  Pulse: 117  Height: 5\' 10"  (1.778 m)  Weight: 182 lb 3.2 oz (  82.645 kg)    GEN- The patient is well appearing, alert and oriented x 3 today.   Head- normocephalic, atraumatic Eyes-  Sclera clear, conjunctiva pink Ears- hearing intact Oropharynx- clear Neck- supple, no JVP Lymph- no cervical lymphadenopathy Lungs- Clear to ausculation bilaterally, normal work of breathing Heart- irregular rate and rhythm, no murmurs, rubs or gallops, PMI not laterally displaced GI- soft, NT, ND, + BS Extremities- no clubbing, cyanosis, or edema MS- no significant deformity or atrophy Skin- no rash or lesion Psych- euthymic mood, full affect Neuro- strength and sensation are intact  EKG-afib at 117 bpm, qrs int 108 ms, qtc 507 ms Epic records reviewed  Assessment and Plan: 1. PAF Increase amiodarone to 200 mg bid INR, cmet,TSH today  F/u Wednesday to see if persisits in afib and discuss if pt wants to pursue with DCCV with TEE/ cardioversion or wait until INR has been therapeutic x 3 weeks.

## 2015-11-24 ENCOUNTER — Ambulatory Visit (HOSPITAL_COMMUNITY)
Admission: RE | Admit: 2015-11-24 | Discharge: 2015-11-24 | Disposition: A | Discharge status: Home / Self Care | Payer: Medicare Other | Source: Ambulatory Visit | Attending: Nurse Practitioner | Admitting: Nurse Practitioner

## 2015-11-24 ENCOUNTER — Encounter (HOSPITAL_COMMUNITY): Payer: Self-pay | Admitting: Nurse Practitioner

## 2015-11-24 DIAGNOSIS — I480 Paroxysmal atrial fibrillation: Secondary | ICD-10-CM | POA: Insufficient documentation

## 2015-11-24 DIAGNOSIS — E785 Hyperlipidemia, unspecified: Secondary | ICD-10-CM | POA: Diagnosis not present

## 2015-11-24 DIAGNOSIS — Z0189 Encounter for other specified special examinations: Secondary | ICD-10-CM | POA: Insufficient documentation

## 2015-11-24 DIAGNOSIS — E039 Hypothyroidism, unspecified: Secondary | ICD-10-CM | POA: Diagnosis not present

## 2015-11-24 DIAGNOSIS — I481 Persistent atrial fibrillation: Secondary | ICD-10-CM

## 2015-11-24 DIAGNOSIS — K219 Gastro-esophageal reflux disease without esophagitis: Secondary | ICD-10-CM | POA: Insufficient documentation

## 2015-11-24 DIAGNOSIS — I2510 Atherosclerotic heart disease of native coronary artery without angina pectoris: Secondary | ICD-10-CM | POA: Diagnosis not present

## 2015-11-24 DIAGNOSIS — I10 Essential (primary) hypertension: Secondary | ICD-10-CM | POA: Diagnosis not present

## 2015-11-24 DIAGNOSIS — I743 Embolism and thrombosis of arteries of the lower extremities: Secondary | ICD-10-CM | POA: Diagnosis not present

## 2015-11-24 DIAGNOSIS — N400 Enlarged prostate without lower urinary tract symptoms: Secondary | ICD-10-CM | POA: Insufficient documentation

## 2015-11-24 DIAGNOSIS — K2270 Barrett's esophagus without dysplasia: Secondary | ICD-10-CM | POA: Insufficient documentation

## 2015-11-24 DIAGNOSIS — F329 Major depressive disorder, single episode, unspecified: Secondary | ICD-10-CM | POA: Diagnosis not present

## 2015-11-24 DIAGNOSIS — K5730 Diverticulosis of large intestine without perforation or abscess without bleeding: Secondary | ICD-10-CM | POA: Insufficient documentation

## 2015-11-24 DIAGNOSIS — I340 Nonrheumatic mitral (valve) insufficiency: Secondary | ICD-10-CM | POA: Diagnosis not present

## 2015-11-24 DIAGNOSIS — G4730 Sleep apnea, unspecified: Secondary | ICD-10-CM | POA: Insufficient documentation

## 2015-11-24 DIAGNOSIS — I4819 Other persistent atrial fibrillation: Secondary | ICD-10-CM

## 2015-11-24 DIAGNOSIS — Z7901 Long term (current) use of anticoagulants: Secondary | ICD-10-CM | POA: Insufficient documentation

## 2015-11-24 NOTE — Patient Instructions (Signed)
Cardioversion scheduled for Friday, May 12th  - Arrive at the [facility] and go to admitting at 930AM  -Do not eat or drink anything after midnight the night prior to your procedure.  - Take all your medication with a sip of water prior to arrival.  - You will not be able to drive home after your procedure.  Decrease Amiodarone back to 200mg  once a day after the cardioversion

## 2015-11-24 NOTE — Progress Notes (Signed)
Patient ID: [name], male   DOB: Feb 11, 1927, 82 y.o.   MRN: [id number]     Primary Care Physician: Nyoka Cowden, MD Referring Physician: Areatha Keas triage Cardiologist: Dr. Laretta Alstrom [name] is a 82 y.o. male with a h/o PAF, that went into afib with rvr this am, 5/8.[name] He does not feel well in afib with weakness. He usually requires cardioversion to get back in SR. He generally runs a soft BP. His last INR was therapeutic was 2 weeks ago, but before that last one was March 21 st,which was therapeutic, but the INR in 2/16 was low at 1.9. Explained to pt would need 3-4 weeks of therapeutic INR's before cardioversion, and he is not excited about waiting to have DCCV, but can have done earlier if consents to  TEE. Since he has not even been in afib x 12 hours, will increase amiodrone to 200 mg bid and bring back on Wednesday. INR today. No known trigger.  Returns to afib clinic, 5/10, remains in afib, despite increasing amiodarone to 200 mg bid and wishes to pursue cardioversion. Explained to pt 3-4 weeks of therapeutic INR's when on coumadin is protocol and he wants to pursue with TEE to hasten return to SR. He does not feel well in afib.  Today, he denies symptoms of palpitations, chest pain, shortness of breath, orthopnea, PND, lower extremity edema, dizziness, presyncope, syncope, or neurologic sequela. The patient is tolerating medications without difficulties and is otherwise without complaint today.   Past Medical History  Diagnosis Date  . HYPOTHYROIDISM   . HYPERLIPIDEMIA   . DEPRESSION   . HYPERTENSION   . CORONARY ARTERY DISEASE     a. s/p stent OM 2002. b. s/p BMS 2006 RCA  c. s/p cath 2007.  [name] GERD   . DIVERTICULOSIS, COLON   . BENIGN PROSTATIC HYPERTROPHY   . GYNECOMASTIA, UNILATERAL   . SLEEP APNEA     CPAP  . Hematoma of abdominal wall   . Mallory - Weiss tear     a. > 5 years ago  . Barrett's esophagus   . Popliteal artery embolism, right ( [place])   .  History of echocardiogram     a. EF 55% (TEE 06/2014)  . Paroxysmal a-fib (HCC)     a. chronic amiodarone and coumadin;  b. 05/2013 s/p DCCV. c. 06/2014 s/p TEE/DCCV.  [name] Chronic anticoagulation     a. on Coumadin  . Mitral regurgitation     a. Mild-mod by TEE 06/2014.  [name] Anemia    Past Surgical History  Procedure Laterality Date  . Transurethral resection of prostate    . Coronary angioplasty with stent placement    . Tee with cardioversion  7/08  . Cardioversion  8/08  . Esophagogastroduodenoscopy    . Tee without cardioversion  09/14/2011    Procedure: TRANSESOPHAGEAL ECHOCARDIOGRAM (TEE);  Surgeon: Josue Hector, MD;  Location: [place];  Service: Cardiovascular;  Laterality: N/A;  . Cardioversion  09/14/2011    Procedure: CARDIOVERSION;  Surgeon: Josue Hector, MD;  Location: [place];  Service: Cardiovascular;  Laterality: N/A;  . Cardioversion N/A 03/23/2014    Procedure: CARDIOVERSION;  Surgeon: Thompson Grayer, MD;  Location: [place];  Service: Cardiovascular;  Laterality: N/A;  . Tee without cardioversion N/A 06/22/2014    Procedure: TRANSESOPHAGEAL ECHOCARDIOGRAM (TEE);  Surgeon: [name], MD;  Location: [place];  Service: Cardiovascular;  Laterality: N/A;  . Cardioversion N/A 06/22/2014    Procedure: CARDIOVERSION;  Surgeon: [name], MD;  Location: [facility] ENDOSCOPY;  Service: Cardiovascular;  Laterality: N/A;  . Cardioversion N/A 04/27/2015    Procedure: CARDIOVERSION;  Surgeon: Josue Hector, MD;  Location: [place];  Service: Cardiovascular;  Laterality: N/A;  . Cardioversion N/A 07/16/2015    Procedure: CARDIOVERSION;  Surgeon: [name], MD;  Location: [facility] ENDOSCOPY;  Service: Cardiovascular;  Laterality: N/A;    Current Outpatient Prescriptions  Medication Sig Dispense Refill  . amiodarone (PACERONE) 200 MG tablet Take 1 tablet (200 mg total) by mouth 2 (two) times daily. Starting on February 14 th 60 tablet 11  . amLODipine (NORVASC) 5  MG tablet Take 1 tablet (5 mg total) by mouth daily. 30 tablet 3  . clonazePAM (KLONOPIN) 1 MG tablet Take 1 tablet (1 mg total) by mouth at bedtime. 30 tablet 5  . DIGESTIVE ENZYMES PO Take 1 tablet by mouth 2 (two) times daily.     . ferrous sulfate 325 (65 FE) MG tablet Take 325 mg by mouth 2 (two) times daily with a meal.    . finasteride (PROSCAR) 5 MG tablet Take 5 mg by mouth daily.  11  . fish oil-omega-3 fatty acids 1000 MG capsule Take 1 g by mouth 2 (two) times daily.     . hydroxypropyl methylcellulose (ISOPTO TEARS) 2.5 % ophthalmic solution Place 1 drop into both eyes 3 (three) times daily as needed for dry eyes.     [name] levothyroxine (SYNTHROID, LEVOTHROID) 50 MCG tablet Take 50 mcg by mouth See admin instructions. On Tuesday thursdays and saturdays patient stats he takes a full tablet then take a half along with it on these days. 1 whole tablet rest of days    . Multiple Vitamin (MULTIVITAMIN) capsule Take 1 capsule by mouth daily.    . nitroGLYCERIN (NITROSTAT) 0.4 MG SL tablet Place 0.4 mg under the tongue every 5 (five) minutes as needed for chest pain.     . pantoprazole (PROTONIX) 40 MG tablet Take 40 mg by mouth 2 (two) times daily.     . pravastatin (PRAVACHOL) 40 MG tablet Take 40 mg by mouth every evening.    . traZODone (DESYREL) 50 MG tablet Take by mouth at bedtime as needed for sleep.     [name] warfarin (COUMADIN) 5 MG tablet Take 2.5-5 mg by mouth daily. Take 5mg  on Monday Wednesday and fridays. Then take half of the 5mg  rest of days     No current facility-administered medications for this encounter.    Allergies  Allergen Reactions  . Metoprolol Tartrate Other (See Comments)    Anxiety, heart rate goes too low, feels like in a fog.    Social History   Social History  . Marital Status: Married    Spouse Name: N/A  . Number of Children: 1  . Years of Education: N/A   Occupational History  . Retired    Social History Main Topics  . Smoking status: Never  Smoker   . Smokeless tobacco: Never Used  . Alcohol Use: No  . Drug Use: No  . Sexual Activity: Not on file   Other Topics Concern  . Not on file   Social History Narrative   He is retired from Landscape architect.   Mother lived into her 59's.  Father died in his 71's.  No CAD.    Family History  Problem Relation Age of Onset  . Hypertension Other   . Heart disease Brother     CAD male  1st degree relative  . Heart attack Mother   . Stroke Neg Hx     ROS- All systems are reviewed and negative except as per the HPI above  Physical Exam: Filed Vitals:   11/24/15 1117  BP: 122/84  Pulse: 119  Height: 5\' 10"  (1.778 m)  Weight: 182 lb (82.555 kg)    GEN- The patient is well appearing, alert and oriented x 3 today.   Head- normocephalic, atraumatic Eyes-  Sclera clear, conjunctiva pink Ears- hearing intact Oropharynx- clear Neck- supple, no JVP Lymph- no cervical lymphadenopathy Lungs- Clear to ausculation bilaterally, normal work of breathing Heart- irregular rate and rhythm, no murmurs, rubs or gallops, PMI not laterally displaced GI- soft, NT, ND, + BS Extremities- no clubbing, cyanosis, or edema MS- no significant deformity or atrophy Skin- no rash or lesion Psych- euthymic mood, full affect Neuro- strength and sensation are intact  EKG-afib, rvr at  at 119 bpm, qrs int 106 ms, qtc 503 ms Epic records reviewed  Assessment and Plan:  1. Symptomatic PAF Coninue amiodarone  200 mg bid TEE cardioversion scheduled for Friday Risks vrs benefit of procedure discussed  with pt INR am of cardioversion Will decrease amiodarone to 200 mg a day starting Friday  F/u here in one week   Butch Penny C. Esiah Bazinet, [facility] [address] [place], [place] [address] [phone]

## 2015-11-26 ENCOUNTER — Encounter (HOSPITAL_COMMUNITY)
Admission: RE | Disposition: A | Discharge status: Home / Self Care | Payer: Self-pay | Source: Ambulatory Visit | Attending: Cardiovascular Disease

## 2015-11-26 ENCOUNTER — Encounter (HOSPITAL_COMMUNITY): Payer: Self-pay | Admitting: *Deleted

## 2015-11-26 ENCOUNTER — Ambulatory Visit (HOSPITAL_COMMUNITY)
Admission: RE | Admit: 2015-11-26 | Discharge: 2015-11-26 | Disposition: A | Discharge status: Home / Self Care | Payer: Medicare Other | Source: Ambulatory Visit | Attending: Cardiovascular Disease | Admitting: Cardiovascular Disease

## 2015-11-26 ENCOUNTER — Ambulatory Visit (HOSPITAL_COMMUNITY): Payer: Medicare Other | Admitting: Anesthesiology

## 2015-11-26 ENCOUNTER — Other Ambulatory Visit (HOSPITAL_COMMUNITY): Payer: Medicare Other

## 2015-11-26 DIAGNOSIS — I481 Persistent atrial fibrillation: Secondary | ICD-10-CM | POA: Diagnosis not present

## 2015-11-26 DIAGNOSIS — Z7901 Long term (current) use of anticoagulants: Secondary | ICD-10-CM | POA: Diagnosis not present

## 2015-11-26 DIAGNOSIS — I2510 Atherosclerotic heart disease of native coronary artery without angina pectoris: Secondary | ICD-10-CM | POA: Insufficient documentation

## 2015-11-26 DIAGNOSIS — E785 Hyperlipidemia, unspecified: Secondary | ICD-10-CM | POA: Insufficient documentation

## 2015-11-26 DIAGNOSIS — Z8249 Family history of ischemic heart disease and other diseases of the circulatory system: Secondary | ICD-10-CM | POA: Diagnosis not present

## 2015-11-26 DIAGNOSIS — E039 Hypothyroidism, unspecified: Secondary | ICD-10-CM | POA: Insufficient documentation

## 2015-11-26 DIAGNOSIS — I340 Nonrheumatic mitral (valve) insufficiency: Secondary | ICD-10-CM | POA: Diagnosis not present

## 2015-11-26 DIAGNOSIS — I4819 Other persistent atrial fibrillation: Secondary | ICD-10-CM

## 2015-11-26 DIAGNOSIS — I480 Paroxysmal atrial fibrillation: Secondary | ICD-10-CM | POA: Insufficient documentation

## 2015-11-26 DIAGNOSIS — I4891 Unspecified atrial fibrillation: Secondary | ICD-10-CM | POA: Diagnosis present

## 2015-11-26 HISTORY — PX: CARDIOVERSION: SHX1299

## 2015-11-26 LAB — PROTIME-INR
INR: 2.53 — AB (ref 0.00–1.49)
PROTHROMBIN TIME: 26.9 s — AB (ref 11.6–15.2)

## 2015-11-26 SURGERY — CARDIOVERSION
Anesthesia: Monitor Anesthesia Care

## 2015-11-26 MED ORDER — PROPOFOL 10 MG/ML IV BOLUS
INTRAVENOUS | Status: DC | PRN
  Administered 2015-11-26: 50 mg via INTRAVENOUS

## 2015-11-26 MED ORDER — FENTANYL CITRATE (PF) 100 MCG/2ML IJ SOLN: 25.0000 ug | INTRAMUSCULAR | Status: DC | PRN

## 2015-11-26 MED ORDER — ONDANSETRON HCL 4 MG/2ML IJ SOLN: 4.0000 mg | Freq: Once | INTRAMUSCULAR | Status: DC | PRN

## 2015-11-26 MED ORDER — LACTATED RINGERS IV SOLN
INTRAVENOUS | Status: DC
  Administered 2015-11-26: 09:00:00 via INTRAVENOUS

## 2015-11-26 MED ORDER — SODIUM CHLORIDE 0.9 % IV SOLN: INTRAVENOUS | Status: DC

## 2015-11-26 NOTE — Discharge Instructions (Signed)
Electrical Cardioversion, Care After °Refer to this sheet in the next few weeks. These instructions provide you with information on caring for yourself after your procedure. Your health care provider may also give you more specific instructions. Your treatment has been planned according to current medical practices, but problems sometimes occur. Call your health care provider if you have any problems or questions after your procedure. °WHAT TO EXPECT AFTER THE PROCEDURE °After your procedure, it is typical to have the following sensations: °· Some redness on the skin where the shocks were delivered. If this is tender, a sunburn lotion or hydrocortisone cream may help. °· Possible return of an abnormal heart rhythm within hours or days after the procedure. °HOME CARE INSTRUCTIONS °· Take medicines only as directed by your health care provider. Be sure you understand how and when to take your medicine. °· Learn how to feel your pulse and check it often. °· Limit your activity for 48 hours after the procedure or as directed by your health care provider. °· Avoid or minimize caffeine and other stimulants as directed by your health care provider. °SEEK MEDICAL CARE IF: °· You feel like your heart is beating too fast or your pulse is not regular. °· You have any questions about your medicines. °· You have bleeding that will not stop. °SEEK IMMEDIATE MEDICAL CARE IF: °· You are dizzy or feel faint. °· It is hard to breathe or you feel short of breath. °· There is a change in discomfort in your chest. °· Your speech is slurred or you have trouble moving an arm or leg on one side of your body. °· You get a serious muscle cramp that does not go away. °· Your fingers or toes turn cold or blue. °  °This information is not intended to replace advice given to you by your health care provider. Make sure you discuss any questions you have with your health care provider. °  °Document Released: 04/23/2013 Document Revised: 07/24/2014  Document Reviewed: 04/23/2013 °Elsevier Interactive Patient Education ©2016 [employer] ° °

## 2015-11-26 NOTE — H&P (View-Only) (Signed)
Patient ID: [name], male   DOB: 01-Oct-1926, 82 y.o.   MRN: [id number]     Primary Care Physician: Nyoka Cowden, MD Referring Physician: Areatha Keas triage Cardiologist: Dr. Laretta Alstrom [name] is a 82 y.o. male with a h/o PAF, that went into afib with rvr this am, 5/8.[name] He does not feel well in afib with weakness. He usually requires cardioversion to get back in SR. He generally runs a soft BP. His last INR was therapeutic was 2 weeks ago, but before that last one was March 21 st,which was therapeutic, but the INR in 2/16 was low at 1.9. Explained to pt would need 3-4 weeks of therapeutic INR's before cardioversion, and he is not excited about waiting to have DCCV, but can have done earlier if consents to  TEE. Since he has not even been in afib x 12 hours, will increase amiodrone to 200 mg bid and bring back on Wednesday. INR today. No known trigger.  Returns to afib clinic, 5/10, remains in afib, despite increasing amiodarone to 200 mg bid and wishes to pursue cardioversion. Explained to pt 3-4 weeks of therapeutic INR's when on coumadin is protocol and he wants to pursue with TEE to hasten return to SR. He does not feel well in afib.  Today, he denies symptoms of palpitations, chest pain, shortness of breath, orthopnea, PND, lower extremity edema, dizziness, presyncope, syncope, or neurologic sequela. The patient is tolerating medications without difficulties and is otherwise without complaint today.   Past Medical History  Diagnosis Date  . HYPOTHYROIDISM   . HYPERLIPIDEMIA   . DEPRESSION   . HYPERTENSION   . CORONARY ARTERY DISEASE     a. s/p stent OM 2002. b. s/p BMS 2006 RCA  c. s/p cath 2007.  [name] GERD   . DIVERTICULOSIS, COLON   . BENIGN PROSTATIC HYPERTROPHY   . GYNECOMASTIA, UNILATERAL   . SLEEP APNEA     CPAP  . Hematoma of abdominal wall   . Mallory - Weiss tear     a. > 5 years ago  . Barrett's esophagus   . Popliteal artery embolism, right ([place])   .  History of echocardiogram     a. EF 55% (TEE 06/2014)  . Paroxysmal a-fib (HCC)     a. chronic amiodarone and coumadin;  b. 05/2013 s/p DCCV. c. 06/2014 s/p TEE/DCCV.  [name] Chronic anticoagulation     a. on Coumadin  . Mitral regurgitation     a. Mild-mod by TEE 06/2014.  [name] Anemia    Past Surgical History  Procedure Laterality Date  . Transurethral resection of prostate    . Coronary angioplasty with stent placement    . Tee with cardioversion  7/08  . Cardioversion  8/08  . Esophagogastroduodenoscopy    . Tee without cardioversion  09/14/2011    Procedure: TRANSESOPHAGEAL ECHOCARDIOGRAM (TEE);  Surgeon: Josue Hector, MD;  Location: [place];  Service: Cardiovascular;  Laterality: N/A;  . Cardioversion  09/14/2011    Procedure: CARDIOVERSION;  Surgeon: Josue Hector, MD;  Location: [place];  Service: Cardiovascular;  Laterality: N/A;  . Cardioversion N/A 03/23/2014    Procedure: CARDIOVERSION;  Surgeon: Thompson Grayer, MD;  Location: [facility];  Service: Cardiovascular;  Laterality: N/A;  . Tee without cardioversion N/A 06/22/2014    Procedure: TRANSESOPHAGEAL ECHOCARDIOGRAM (TEE);  Surgeon: [name], MD;  Location: [place];  Service: Cardiovascular;  Laterality: N/A;  . Cardioversion N/A 06/22/2014    Procedure: CARDIOVERSION;  Surgeon: [name], MD;  Location: [facility] ENDOSCOPY;  Service: Cardiovascular;  Laterality: N/A;  . Cardioversion N/A 04/27/2015    Procedure: CARDIOVERSION;  Surgeon: Josue Hector, MD;  Location: [place];  Service: Cardiovascular;  Laterality: N/A;  . Cardioversion N/A 07/16/2015    Procedure: CARDIOVERSION;  Surgeon: [name], MD;  Location: [facility] ENDOSCOPY;  Service: Cardiovascular;  Laterality: N/A;    Current Outpatient Prescriptions  Medication Sig Dispense Refill  . amiodarone (PACERONE) 200 MG tablet Take 1 tablet (200 mg total) by mouth 2 (two) times daily. Starting on February 14 th 60 tablet 11  . amLODipine (NORVASC) 5  MG tablet Take 1 tablet (5 mg total) by mouth daily. 30 tablet 3  . clonazePAM (KLONOPIN) 1 MG tablet Take 1 tablet (1 mg total) by mouth at bedtime. 30 tablet 5  . DIGESTIVE ENZYMES PO Take 1 tablet by mouth 2 (two) times daily.     . ferrous sulfate 325 (65 FE) MG tablet Take 325 mg by mouth 2 (two) times daily with a meal.    . finasteride (PROSCAR) 5 MG tablet Take 5 mg by mouth daily.  11  . fish oil-omega-3 fatty acids 1000 MG capsule Take 1 g by mouth 2 (two) times daily.     . hydroxypropyl methylcellulose (ISOPTO TEARS) 2.5 % ophthalmic solution Place 1 drop into both eyes 3 (three) times daily as needed for dry eyes.     [name] levothyroxine (SYNTHROID, LEVOTHROID) 50 MCG tablet Take 50 mcg by mouth See admin instructions. On Tuesday thursdays and saturdays patient stats he takes a full tablet then take a half along with it on these days. 1 whole tablet rest of days    . Multiple Vitamin (MULTIVITAMIN) capsule Take 1 capsule by mouth daily.    . nitroGLYCERIN (NITROSTAT) 0.4 MG SL tablet Place 0.4 mg under the tongue every 5 (five) minutes as needed for chest pain.     . pantoprazole (PROTONIX) 40 MG tablet Take 40 mg by mouth 2 (two) times daily.     . pravastatin (PRAVACHOL) 40 MG tablet Take 40 mg by mouth every evening.    . traZODone (DESYREL) 50 MG tablet Take by mouth at bedtime as needed for sleep.     [name] warfarin (COUMADIN) 5 MG tablet Take 2.5-5 mg by mouth daily. Take 5mg  on Monday Wednesday and fridays. Then take half of the 5mg  rest of days     No current facility-administered medications for this encounter.    Allergies  Allergen Reactions  . Metoprolol Tartrate Other (See Comments)    Anxiety, heart rate goes too low, feels like in a fog.    Social History   Social History  . Marital Status: Married    Spouse Name: N/A  . Number of Children: 1  . Years of Education: N/A   Occupational History  . Retired    Social History Main Topics  . Smoking status: Never  Smoker   . Smokeless tobacco: Never Used  . Alcohol Use: No  . Drug Use: No  . Sexual Activity: Not on file   Other Topics Concern  . Not on file   Social History Narrative   He is retired from Landscape [employer].   Mother lived into her 21's.  Father died in his 59's.  No CAD.    Family History  Problem Relation Age of Onset  . Hypertension Other   . Heart disease Brother     CAD male  1st degree relative  . Heart attack Mother   . Stroke Neg Hx     ROS- All systems are reviewed and negative except as per the HPI above  Physical Exam: Filed Vitals:   11/24/15 1117  BP: 122/84  Pulse: 119  Height: 5\' 10"  (1.778 m)  Weight: 182 lb (82.555 kg)    GEN- The patient is well appearing, alert and oriented x 3 today.   Head- normocephalic, atraumatic Eyes-  Sclera clear, conjunctiva pink Ears- hearing intact Oropharynx- clear Neck- supple, no JVP Lymph- no cervical lymphadenopathy Lungs- Clear to ausculation bilaterally, normal work of breathing Heart- irregular rate and rhythm, no murmurs, rubs or gallops, PMI not laterally displaced GI- soft, NT, ND, + BS Extremities- no clubbing, cyanosis, or edema MS- no significant deformity or atrophy Skin- no rash or lesion Psych- euthymic mood, full affect Neuro- strength and sensation are intact  EKG-afib, rvr at  at 119 bpm, qrs int 106 ms, qtc 503 ms Epic records reviewed  Assessment and Plan:  1. Symptomatic PAF Coninue amiodarone  200 mg bid TEE cardioversion scheduled for Friday Risks vrs benefit of procedure discussed  with pt INR am of cardioversion Will decrease amiodarone to 200 mg a day starting Friday  F/u here in one week   Butch Penny C. Toneisha Savary, [facility] [address] [place], [place] [address] [phone]

## 2015-11-26 NOTE — Anesthesia Preprocedure Evaluation (Addendum)
Anesthesia Evaluation  Patient identified by MRN, date of birth, ID band Patient awake    Reviewed: Allergy & Precautions, NPO status , Patient's Chart, lab work & pertinent test results  History of Anesthesia Complications Negative for: history of anesthetic complications  Airway Mallampati: II  TM Distance: >3 FB Neck ROM: Full    Dental  (+) Dental Advisory Given, Teeth Intact   Pulmonary sleep apnea and Continuous Positive Airway Pressure Ventilation ,    breath sounds clear to auscultation       Cardiovascular hypertension, Pt. on medications + angina (chest pain this am, Dr. Johnsie Cancel aware) + CAD and + Peripheral Vascular Disease  + dysrhythmias  Rhythm:Irregular Rate:Tachycardia  '15 ECHO: EF 55-60%, mod MR   Neuro/Psych    GI/Hepatic Neg liver ROS, GERD  Medicated and Controlled,  Endo/Other  Hypothyroidism   Renal/GU negative Renal ROS     Musculoskeletal   Abdominal   Peds  Hematology  (+) Blood dyscrasia (coumadin), ,   Anesthesia Other Findings   Reproductive/Obstetrics                            Anesthesia Physical  Anesthesia Plan  ASA: III  Anesthesia Plan: MAC   Post-op Pain Management:    Induction: Intravenous  Airway Management Planned: Nasal Cannula  Additional Equipment:   Intra-op Plan:   Post-operative Plan:   Informed Consent: I have reviewed the patients History and Physical, chart, labs and discussed the procedure including the risks, benefits and alternatives for the proposed anesthesia with the patient or authorized representative who has indicated his/her understanding and acceptance.     Plan Discussed with: CRNA and Surgeon  Anesthesia Plan Comments:         Anesthesia Quick Evaluation

## 2015-11-26 NOTE — Op Note (Signed)
Procedure: Electrical Cardioversion Indications:  Atrial Fibrillation  Procedure Details:  Consent: Risks of procedure as well as the alternatives and risks of each were explained to the (patient/caregiver).  Consent for procedure obtained.  Time Out: Verified patient identification, verified procedure, site/side was marked, verified correct patient position, special equipment/implants available, medications/allergies/relevent history reviewed, required imaging and test results available.  Performed  Patient placed on cardiac monitor, pulse oximetry, supplemental oxygen as necessary.  Sedation given: propofol 50 mg IV, Dr. Jillyn Hidden Pacer pads placed anterior and posterior chest.  Cardioverted 1 time(s).  Cardioversion with synchronized biphasic 120J shock.  Evaluation: Findings: Post procedure EKG shows: NSR Complications: None Patient did tolerate procedure well.  Time Spent Directly with the Patient:  30 minutes   [name] 11/26/2015, 11:17 AM

## 2015-11-26 NOTE — Anesthesia Postprocedure Evaluation (Signed)
Anesthesia Post Note  Patient: [name]  Procedure(s) Performed: Procedure(s) (LRB): CARDIOVERSION (N/A)  Patient location during evaluation: PACU Anesthesia Type: MAC Level of consciousness: awake and alert Pain management: pain level controlled Vital Signs Assessment: post-procedure vital signs reviewed and stable Respiratory status: spontaneous breathing, nonlabored ventilation, respiratory function stable and patient connected to nasal cannula oxygen Cardiovascular status: stable and blood pressure returned to baseline Anesthetic complications: no    Last Vitals:  Filed Vitals:   11/26/15 1140 11/26/15 1150  BP: 135/74 124/75  Pulse: 54 55  Temp:    Resp: 18 13    Last Pain: There were no vitals filed for this visit.               Zenaida Deed

## 2015-11-26 NOTE — Transfer of Care (Signed)
Immediate Anesthesia Transfer of Care Note  Patient: [name]  Procedure(s) Performed: Procedure(s): CARDIOVERSION (N/A)  Patient Location: Endoscopy Unit  Anesthesia Type:MAC  Level of Consciousness: awake, alert  and oriented  Airway & Oxygen Therapy: Patient Spontanous Breathing and Patient connected to nasal cannula oxygen  Post-op Assessment: Report given to RN, Post -op Vital signs reviewed and stable and Patient moving all extremities X 4  Post vital signs: Reviewed and stable  Last Vitals:  Filed Vitals:   11/26/15 0913 11/26/15 1120  BP: 149/101 149/85  Pulse: 118 65  Temp: 36.5 C   Resp: 19 17    Last Pain: There were no vitals filed for this visit.       Complications: No apparent anesthesia complications

## 2015-11-26 NOTE — Anesthesia Procedure Notes (Signed)
Procedure Name: MAC Date/Time: 11/26/2015 11:10 AM Performed by: Kyung Rudd Pre-anesthesia Checklist: Patient identified, Emergency Drugs available, Suction available, Patient being monitored and Timeout performed Patient Re-evaluated:Patient Re-evaluated prior to inductionOxygen Delivery Method: Ambu bag Preoxygenation: Pre-oxygenation with 100% oxygen Intubation Type: IV induction

## 2015-11-26 NOTE — Interval H&P Note (Signed)
History and Physical Interval Note:  11/26/2015 8:42 AM  [name]  has presented today for surgery, with the diagnosis of AFIB  The various methods of treatment have been discussed with the patient and family. After consideration of risks, benefits and other options for treatment, the patient has consented to  Procedure(s): TRANSESOPHAGEAL ECHOCARDIOGRAM (TEE) (N/A) CARDIOVERSION (N/A) as a surgical intervention .  The patient's history has been reviewed, patient examined, no change in status, stable for surgery.  I have reviewed the patient's chart and labs.  Questions were answered to the patient's satisfaction.     Zavier Canela

## 2015-11-28 ENCOUNTER — Encounter (HOSPITAL_COMMUNITY): Payer: Self-pay | Admitting: Cardiovascular Disease

## 2015-11-30 ENCOUNTER — Encounter: Payer: Medicare Other | Admitting: Pharmacist Clinician (PhC)/ Clinical Pharmacy Specialist

## 2015-12-02 ENCOUNTER — Ambulatory Visit (HOSPITAL_COMMUNITY)
Admission: RE | Admit: 2015-12-02 | Discharge: 2015-12-02 | Disposition: A | Discharge status: Home / Self Care | Payer: Medicare Other | Source: Ambulatory Visit | Attending: Nurse Practitioner | Admitting: Nurse Practitioner

## 2015-12-02 ENCOUNTER — Encounter (HOSPITAL_COMMUNITY): Payer: Self-pay | Admitting: Nurse Practitioner

## 2015-12-02 VITALS — BP 138/80 | HR 54 | Ht 70.0 in | Wt 188.8 lb

## 2015-12-02 DIAGNOSIS — I340 Nonrheumatic mitral (valve) insufficiency: Secondary | ICD-10-CM | POA: Insufficient documentation

## 2015-12-02 DIAGNOSIS — I4510 Unspecified right bundle-branch block: Secondary | ICD-10-CM | POA: Diagnosis not present

## 2015-12-02 DIAGNOSIS — K2270 Barrett's esophagus without dysplasia: Secondary | ICD-10-CM | POA: Insufficient documentation

## 2015-12-02 DIAGNOSIS — I480 Paroxysmal atrial fibrillation: Secondary | ICD-10-CM | POA: Diagnosis not present

## 2015-12-02 DIAGNOSIS — Z0189 Encounter for other specified special examinations: Secondary | ICD-10-CM | POA: Insufficient documentation

## 2015-12-02 DIAGNOSIS — F329 Major depressive disorder, single episode, unspecified: Secondary | ICD-10-CM | POA: Diagnosis not present

## 2015-12-02 DIAGNOSIS — K219 Gastro-esophageal reflux disease without esophagitis: Secondary | ICD-10-CM | POA: Insufficient documentation

## 2015-12-02 DIAGNOSIS — N400 Enlarged prostate without lower urinary tract symptoms: Secondary | ICD-10-CM | POA: Diagnosis not present

## 2015-12-02 DIAGNOSIS — K5790 Diverticulosis of intestine, part unspecified, without perforation or abscess without bleeding: Secondary | ICD-10-CM | POA: Diagnosis not present

## 2015-12-02 DIAGNOSIS — I481 Persistent atrial fibrillation: Secondary | ICD-10-CM | POA: Insufficient documentation

## 2015-12-02 DIAGNOSIS — I10 Essential (primary) hypertension: Secondary | ICD-10-CM | POA: Diagnosis not present

## 2015-12-02 DIAGNOSIS — R001 Bradycardia, unspecified: Secondary | ICD-10-CM | POA: Diagnosis not present

## 2015-12-02 DIAGNOSIS — D649 Anemia, unspecified: Secondary | ICD-10-CM | POA: Insufficient documentation

## 2015-12-02 DIAGNOSIS — E785 Hyperlipidemia, unspecified: Secondary | ICD-10-CM | POA: Insufficient documentation

## 2015-12-02 DIAGNOSIS — E039 Hypothyroidism, unspecified: Secondary | ICD-10-CM | POA: Insufficient documentation

## 2015-12-02 DIAGNOSIS — Z7901 Long term (current) use of anticoagulants: Secondary | ICD-10-CM | POA: Diagnosis not present

## 2015-12-02 DIAGNOSIS — I2510 Atherosclerotic heart disease of native coronary artery without angina pectoris: Secondary | ICD-10-CM | POA: Insufficient documentation

## 2015-12-02 DIAGNOSIS — I4819 Other persistent atrial fibrillation: Secondary | ICD-10-CM

## 2015-12-02 MED ORDER — AMIODARONE HCL 200 MG PO TABS: 200.0000 mg | ORAL_TABLET | Freq: Every day | ORAL | Status: DC

## 2015-12-02 NOTE — Progress Notes (Signed)
Patient ID: [name], male   DOB: 02-16-1927, 82 y.o.   MRN: [id number]     Primary Care Physician: Kevin Cowden, MD Referring Physician: Dr. Kae [name] [name] is a 82 y.o. male with a h/o persistent afib that is in [facility] clinic today for f/u of successful cardioversion, 5/17. He has been maintaining SR. Overall feels weak, but this a chronic complaint. Has not noticed any irregular heart beat since cardioversion.He is back to 200 mg of amiodarone, bumped up to 400 mg a few days prior to cardioversion.  Today, he denies symptoms of palpitations, chest pain, shortness of breath, orthopnea, PND, lower extremity edema, dizziness, presyncope, syncope, or neurologic sequela. The patient is tolerating medications without difficulties and is otherwise without complaint today.   Past Medical History  Diagnosis Date  . HYPOTHYROIDISM   . HYPERLIPIDEMIA   . DEPRESSION   . HYPERTENSION   . CORONARY ARTERY DISEASE     a. s/p stent OM 2002. b. s/p BMS 2006 RCA  c. s/p cath 2007.  [name] GERD   . DIVERTICULOSIS, COLON   . BENIGN PROSTATIC HYPERTROPHY   . GYNECOMASTIA, UNILATERAL   . SLEEP APNEA     CPAP  . Hematoma of abdominal wall   . Mallory - Weiss tear     a. > 5 years ago  . Barrett's esophagus   . Popliteal artery embolism, right ([place])   . History of echocardiogram     a. EF 55% (TEE 06/2014)  . Paroxysmal a-fib (HCC)     a. chronic amiodarone and coumadin;  b. 05/2013 s/p DCCV. c. 06/2014 s/p TEE/DCCV.  [name] Chronic anticoagulation     a. on Coumadin  . Mitral regurgitation     a. Mild-mod by TEE 06/2014.  [name] Anemia    Past Surgical History  Procedure Laterality Date  . Transurethral resection of prostate    . Coronary angioplasty with stent placement    . Tee with cardioversion  7/08  . Cardioversion  8/08  . Esophagogastroduodenoscopy    . Tee without cardioversion  09/14/2011    Procedure: TRANSESOPHAGEAL ECHOCARDIOGRAM (TEE);  Surgeon: Josue Hector, MD;   Location: [place];  Service: Cardiovascular;  Laterality: N/A;  . Cardioversion  09/14/2011    Procedure: CARDIOVERSION;  Surgeon: Josue Hector, MD;  Location: [place];  Service: Cardiovascular;  Laterality: N/A;  . Cardioversion N/A 03/23/2014    Procedure: CARDIOVERSION;  Surgeon: Thompson Grayer, MD;  Location: [place];  Service: Cardiovascular;  Laterality: N/A;  . Tee without cardioversion N/A 06/22/2014    Procedure: TRANSESOPHAGEAL ECHOCARDIOGRAM (TEE);  Surgeon: [name], MD;  Location: [place];  Service: Cardiovascular;  Laterality: N/A;  . Cardioversion N/A 06/22/2014    Procedure: CARDIOVERSION;  Surgeon: [name], MD;  Location: [place];  Service: Cardiovascular;  Laterality: N/A;  . Cardioversion N/A 04/27/2015    Procedure: CARDIOVERSION;  Surgeon: Josue Hector, MD;  Location: [facility] ENDOSCOPY;  Service: Cardiovascular;  Laterality: N/A;  . Cardioversion N/A 07/16/2015    Procedure: CARDIOVERSION;  Surgeon: [name], MD;  Location: [place];  Service: Cardiovascular;  Laterality: N/A;  . Cardioversion N/A 11/26/2015    Procedure: CARDIOVERSION;  Surgeon: Sanda Klein, MD;  Location: [facility] ENDOSCOPY;  Service: Cardiovascular;  Laterality: N/A;    Current Outpatient Prescriptions  Medication Sig Dispense Refill  . amiodarone (PACERONE) 200 MG tablet Take 1 tablet (200 mg total) by mouth daily. Starting on February 14 th  60 tablet 11  . amLODipine (NORVASC) 5 MG tablet Take 1 tablet (5 mg total) by mouth daily. 30 tablet 3  . clonazePAM (KLONOPIN) 1 MG tablet Take 1 tablet (1 mg total) by mouth at bedtime. 30 tablet 5  . DIGESTIVE ENZYMES PO Take 1 tablet by mouth 2 (two) times daily.     . ferrous sulfate 325 (65 FE) MG tablet Take 325 mg by mouth 2 (two) times daily with a meal.    . finasteride (PROSCAR) 5 MG tablet Take 5 mg by mouth daily.  11  . fish oil-omega-3 fatty acids 1000 MG capsule Take 1 g by mouth 2 (two) times daily.     .  hydroxypropyl methylcellulose (ISOPTO TEARS) 2.5 % ophthalmic solution Place 1 drop into both eyes 3 (three) times daily as needed for dry eyes.     [name] levothyroxine (SYNTHROID, LEVOTHROID) 50 MCG tablet Take 50 mcg by mouth See admin instructions. On Tuesday thursdays and saturdays patient stats he takes a full tablet then take a half along with it on these days. 1 whole tablet rest of days    . Multiple Vitamin (MULTIVITAMIN) capsule Take 1 capsule by mouth daily.    . nitroGLYCERIN (NITROSTAT) 0.4 MG SL tablet Place 0.4 mg under the tongue every 5 (five) minutes as needed for chest pain.     . pantoprazole (PROTONIX) 40 MG tablet Take 40 mg by mouth 2 (two) times daily.     . pravastatin (PRAVACHOL) 40 MG tablet Take 40 mg by mouth every evening.    . traZODone (DESYREL) 50 MG tablet Take by mouth at bedtime as needed for sleep.     [name] warfarin (COUMADIN) 5 MG tablet Take 2.5-5 mg by mouth daily. Take 5mg  on Monday Wednesday and fridays. Then take half of the 5mg  rest of days     No current facility-administered medications for this encounter.    Allergies  Allergen Reactions  . Metoprolol Tartrate Other (See Comments)    Anxiety, heart rate goes too low, feels like in a fog.    Social History   Social History  . Marital Status: Married    Spouse Name: N/A  . Number of Children: 1  . Years of Education: N/A   Occupational History  . Retired    Social History Main Topics  . Smoking status: Never Smoker   . Smokeless tobacco: Never Used  . Alcohol Use: No  . Drug Use: No  . Sexual Activity: Not on file   Other Topics Concern  . Not on file   Social History Narrative   He is retired from Landscape [employer].   Mother lived into her 16's.  Father died in his 25's.  No CAD.    Family History  Problem Relation Age of Onset  . Hypertension Other   . Heart disease Brother     CAD male 1st degree relative  . Heart attack Mother   . Stroke Neg Hx     ROS- All systems are  reviewed and negative except as per the HPI above  Physical Exam: Filed Vitals:   12/02/15 1054  BP: 138/80  Pulse: 54  Height: 5\' 10"  (1.778 m)  Weight: 188 lb 12.8 oz (85.639 kg)    GEN- The patient is well appearing, alert and oriented x 3 today.   Head- normocephalic, atraumatic Eyes-  Sclera clear, conjunctiva pink Ears- hearing intact Oropharynx- clear Neck- supple, no JVP Lymph- no cervical lymphadenopathy Lungs- Clear to  ausculation bilaterally, normal work of breathing Heart- Regular rate and rhythm, no murmurs, rubs or gallops, PMI not laterally displaced GI- soft, NT, ND, + BS Extremities- no clubbing, cyanosis, or edema MS- no significant deformity or atrophy Skin- no rash or lesion Psych- euthymic mood, full affect Neuro- strength and sensation are intact  EKG-SB, IRBBB at 54 bpm, pr int 196 ms, Qrs int 114 ms, Qtc 464 ms Epic records reviewed  Assessment and Plan: 1. Afib S/p successful cardioversion Continue amiodarone at 200 mg a day Continue warfarin  F/u Dr. Percival Spanish as scheduled  afib clinic as scheduled

## 2015-12-14 ENCOUNTER — Ambulatory Visit (INDEPENDENT_AMBULATORY_CARE_PROVIDER_SITE_OTHER): Payer: Medicare Other | Admitting: Pharmacist

## 2015-12-14 ENCOUNTER — Other Ambulatory Visit: Payer: Self-pay | Admitting: Cardiology

## 2015-12-14 DIAGNOSIS — I4891 Unspecified atrial fibrillation: Secondary | ICD-10-CM

## 2015-12-14 DIAGNOSIS — Z7901 Long term (current) use of anticoagulants: Secondary | ICD-10-CM | POA: Diagnosis not present

## 2015-12-14 DIAGNOSIS — Z5181 Encounter for therapeutic drug level monitoring: Secondary | ICD-10-CM

## 2015-12-14 LAB — POCT INR: INR: 2.2

## 2016-01-11 ENCOUNTER — Ambulatory Visit (INDEPENDENT_AMBULATORY_CARE_PROVIDER_SITE_OTHER): Payer: Medicare Other | Admitting: Pharmacist

## 2016-01-11 DIAGNOSIS — Z5181 Encounter for therapeutic drug level monitoring: Secondary | ICD-10-CM

## 2016-01-11 DIAGNOSIS — Z7901 Long term (current) use of anticoagulants: Secondary | ICD-10-CM | POA: Diagnosis not present

## 2016-01-11 DIAGNOSIS — I4891 Unspecified atrial fibrillation: Secondary | ICD-10-CM | POA: Diagnosis not present

## 2016-01-11 LAB — POCT INR: INR: 2.8

## 2016-02-03 ENCOUNTER — Encounter: Payer: Self-pay | Admitting: Pulmonary Disease

## 2016-02-03 ENCOUNTER — Ambulatory Visit (INDEPENDENT_AMBULATORY_CARE_PROVIDER_SITE_OTHER): Payer: Medicare Other | Admitting: Pulmonary Disease

## 2016-02-03 VITALS — BP 104/78 | HR 57 | Ht 71.0 in | Wt 191.0 lb

## 2016-02-03 DIAGNOSIS — G4733 Obstructive sleep apnea (adult) (pediatric): Secondary | ICD-10-CM

## 2016-02-03 DIAGNOSIS — G4750 Parasomnia, unspecified: Secondary | ICD-10-CM

## 2016-02-03 DIAGNOSIS — Z9989 Dependence on other enabling machines and devices: Principal | ICD-10-CM

## 2016-02-03 NOTE — Patient Instructions (Signed)
Will get copy of CPAP report  Follow up in 1 year 

## 2016-02-03 NOTE — Progress Notes (Signed)
Current Outpatient Prescriptions on File Prior to Visit  Medication Sig  . amiodarone (PACERONE) 200 MG tablet Take 1 tablet (200 mg total) by mouth daily. Starting on February 14 th  . amLODipine (NORVASC) 5 MG tablet Take 1 tablet (5 mg total) by mouth daily.  . clonazePAM (KLONOPIN) 1 MG tablet Take 1 tablet (1 mg total) by mouth at bedtime.  [name] DIGESTIVE ENZYMES PO Take 1 tablet by mouth 2 (two) times daily.   . ferrous sulfate 325 (65 FE) MG tablet Take 325 mg by mouth 2 (two) times daily with a meal.  . finasteride (PROSCAR) 5 MG tablet Take 5 mg by mouth daily.  . fish oil-omega-3 fatty acids 1000 MG capsule Take 1 g by mouth 2 (two) times daily.   . hydroxypropyl methylcellulose (ISOPTO TEARS) 2.5 % ophthalmic solution Place 1 drop into both eyes 3 (three) times daily as needed for dry eyes.   [name] levothyroxine (SYNTHROID, LEVOTHROID) 50 MCG tablet Take 50 mcg by mouth See admin instructions. On Tuesday thursdays and saturdays patient stats he takes a full tablet then take a half along with it on these days. 1 whole tablet rest of days  . Multiple Vitamin (MULTIVITAMIN) capsule Take 1 capsule by mouth daily.  . nitroGLYCERIN (NITROSTAT) 0.4 MG SL tablet Place 0.4 mg under the tongue every 5 (five) minutes as needed for chest pain.   . pantoprazole (PROTONIX) 40 MG tablet Take 40 mg by mouth 2 (two) times daily.   . pravastatin (PRAVACHOL) 40 MG tablet Take 40 mg by mouth every evening.  . traZODone (DESYREL) 50 MG tablet Take by mouth at bedtime as needed for sleep.   [name] warfarin (COUMADIN) 5 MG tablet Take 5mg  on Monday Wednesday and fridays. Then take half of the 5mg  rest of days   No current facility-administered medications on file prior to visit.    Chief Complaint  Patient presents with  . Follow-up    Wears CPAP nightly. Denies problems with mask/pressure. Pt states that he has been having a lot of fatigue. Pt has had 12 Cardioversions and states with every one he has his fatigue  worsens. DME: [facility]    Sleep tests PSG 12/08/05 >> AHI 0.8, SpO2 low 85%, UARS, Central events with sleep onset PSG 09/26/06 >> AHI 0.8, SpO2 low 89%, UARS, Central events with sleep onset ASV titration 01/07/07 PSG 11/14/12 >> AHI 3, SpO2 low 89%, PLMI 0.  Reduced sleep time, no R or S sleep PSG 12/12/12 >> AHI 8.2, SpO2 low 88%, Supine AHI 25.4. Auto CPAP 01/15/14 to 04/14/14 >> used on 78 of 90 nights with average 3 hrs and 33 min. Average AHI is 0.5 with median CPAP 11 cm H2O and 95 th percentile CPAP 14 cm H20.  Pulmonary tests PFT 03/29/11 >> FEV1 3.05 (122%), FEV1% 79, TLC 5.80 (95%), DLCO 66%  Cardiac tests Echo >> EF 55 to 60%, mild AR, mod MR, mod LA dilation  Past medical history Hypothyroidism, HLD, Depression, HTN, CAD, PAF, GERD, Barrett's esophagus, BPH  Past surgical history, Family history, Social history, Allergies reviewed.  Vital signs BP 104/78 mmHg  Pulse 57  Ht 5\' 11"  (1.803 m)  Wt 191 lb (86.637 kg)  BMI 26.65 kg/m2  SpO2 98%  History of Present Illness: [name] is a 82 y.o. male with NREM parasomnia and OSA.  He has been doing well with CPAP and can't sleep w/o it.  No issues with mask fit.  He continues to  use klonopin >> this allows him to sleep through the night.  He had trouble with A fib during the Winter >> had to be cardioverted several times.  He has a hard time getting around, but feels comfortable with his aging process and how he is doing at this stage of his life.   Physical Exam:  General - No distress ENT - No sinus tenderness, no oral exudate, no LAN Cardiac - s1s2 regular, 2/6 SM Chest - No wheeze/rales/dullness Back - No focal tenderness Abd - Soft, non-tender Ext - No edema Neuro - Normal strength Skin - No rashes Psych - normal mood, and behavior  Assessment/Plan:  Obstructive sleep apnea. - continue auto CPAP range to 5 to 12 cm H2O - will get copy of his download and call him with results  NREM parasomnia. - stable  on clonazepam 1 mg qhs - he gets his prescription from the [place]   Patient Instructions  Will get copy of CPAP report  Follow up in 1 year     Chesley Mires, MD [place] Pager:  [phone] 02/03/2016, 10:06 AM

## 2016-02-15 ENCOUNTER — Ambulatory Visit (INDEPENDENT_AMBULATORY_CARE_PROVIDER_SITE_OTHER): Payer: Medicare Other | Admitting: Pharmacist

## 2016-02-15 DIAGNOSIS — Z5181 Encounter for therapeutic drug level monitoring: Secondary | ICD-10-CM | POA: Diagnosis not present

## 2016-02-15 DIAGNOSIS — Z7901 Long term (current) use of anticoagulants: Secondary | ICD-10-CM

## 2016-02-15 DIAGNOSIS — I4891 Unspecified atrial fibrillation: Secondary | ICD-10-CM | POA: Diagnosis not present

## 2016-02-15 LAB — POCT INR: INR: 2.5

## 2016-02-24 ENCOUNTER — Telehealth: Payer: Self-pay | Admitting: Pulmonary Disease

## 2016-02-24 NOTE — Telephone Encounter (Signed)
CPAP 09/23/15 to 11/21/15 >> used on 48 of 60 nights with average 4 hrs 20 minutes.  Average AHI < 5 with CPAP 12 cm H2O.   Will have my nurse inform pt that CPAP report shows good control of sleep apnea.

## 2016-02-25 NOTE — Telephone Encounter (Signed)
Results have been explained to patient, pt expressed understanding. Nothing further needed.  

## 2016-03-09 ENCOUNTER — Encounter: Payer: Self-pay | Admitting: Cardiology

## 2016-03-21 NOTE — Progress Notes (Signed)
HPI The patient presents for followup after recent visits for atrial fibrillation. Since I last saw him he did require cardioversion for atrial fib in May.  He has had no further palpitations.  The patient denies any new symptoms such as chest discomfort, neck or arm discomfort. There has been no new shortness of breath, PND or orthopnea. There have been no reported palpitations, presyncope or syncope.  He has balance issues and severe fatigue.    Allergies  Allergen Reactions  . Metoprolol Tartrate Other (See Comments)    Anxiety, heart rate goes too low, feels like in a fog.    Current Outpatient Prescriptions  Medication Sig Dispense Refill  . amiodarone (PACERONE) 200 MG tablet Take 1 tablet (200 mg total) by mouth daily. Starting on February 14 th 60 tablet 11  . amLODipine (NORVASC) 5 MG tablet Take 1 tablet (5 mg total) by mouth daily. 30 tablet 3  . clonazePAM (KLONOPIN) 1 MG tablet Take 1 tablet (1 mg total) by mouth at bedtime. 30 tablet 5  . DIGESTIVE ENZYMES PO Take 1 tablet by mouth 2 (two) times daily.     . ferrous sulfate 325 (65 FE) MG tablet Take 325 mg by mouth 2 (two) times daily with a meal.    . finasteride (PROSCAR) 5 MG tablet Take 5 mg by mouth daily.  11  . fish oil-omega-3 fatty acids 1000 MG capsule Take 1 g by mouth 2 (two) times daily.     . hydroxypropyl methylcellulose (ISOPTO TEARS) 2.5 % ophthalmic solution Place 1 drop into both eyes 3 (three) times daily as needed for dry eyes.     [name] levothyroxine (SYNTHROID, LEVOTHROID) 50 MCG tablet Take 50 mcg by mouth See admin instructions. On Tuesday thursdays and saturdays patient stats he takes a full tablet then take a half along with it on these days. 1 whole tablet rest of days    . Multiple Vitamin (MULTIVITAMIN) capsule Take 1 capsule by mouth daily.    . nitroGLYCERIN (NITROSTAT) 0.4 MG SL tablet Place 0.4 mg under the tongue every 5 (five) minutes as needed for chest pain.     . pantoprazole (PROTONIX) 40 MG  tablet Take 40 mg by mouth 2 (two) times daily.     . pravastatin (PRAVACHOL) 40 MG tablet Take 40 mg by mouth every evening.    . traZODone (DESYREL) 50 MG tablet Take by mouth at bedtime as needed for sleep.     [name] warfarin (COUMADIN) 5 MG tablet Take 5mg  on Monday Wednesday and fridays. Then take half of the 5mg  rest of days     No current facility-administered medications for this visit.     Past Medical History:  Diagnosis Date  . Anemia   . Barrett's esophagus   . BENIGN PROSTATIC HYPERTROPHY   . Chronic anticoagulation    a. on Coumadin  . CORONARY ARTERY DISEASE    a. s/p stent OM 2002. b. s/p BMS 2006 RCA  c. s/p cath 2007.  [name] DEPRESSION   . DIVERTICULOSIS, COLON   . GERD   . GYNECOMASTIA, UNILATERAL   . Hematoma of abdominal wall   . History of echocardiogram    a. EF 55% (TEE 06/2014)  . HYPERLIPIDEMIA   . HYPERTENSION   . HYPOTHYROIDISM   . Mallory - Weiss tear    a. > 5 years ago  . Mitral regurgitation    a. Mild-mod by TEE 06/2014.  [name] Paroxysmal a-fib ([place])    a.  chronic amiodarone and coumadin;  b. 05/2013 s/p DCCV. c. 06/2014 s/p TEE/DCCV.  [name] Popliteal artery embolism, right ([place])   . SLEEP APNEA    CPAP    Past Surgical History:  Procedure Laterality Date  . cardioversion  8/08  . CARDIOVERSION  09/14/2011   Procedure: CARDIOVERSION;  Surgeon: Josue Hector, MD;  Location: [place];  Service: Cardiovascular;  Laterality: N/A;  . CARDIOVERSION N/A 03/23/2014   Procedure: CARDIOVERSION;  Surgeon: Thompson Grayer, MD;  Location: [place];  Service: Cardiovascular;  Laterality: N/A;  . CARDIOVERSION N/A 06/22/2014   Procedure: CARDIOVERSION;  Surgeon: [name], MD;  Location: [facility] ENDOSCOPY;  Service: Cardiovascular;  Laterality: N/A;  . CARDIOVERSION N/A 04/27/2015   Procedure: CARDIOVERSION;  Surgeon: Josue Hector, MD;  Location: [facility] ENDOSCOPY;  Service: Cardiovascular;  Laterality: N/A;  . CARDIOVERSION N/A 07/16/2015   Procedure: CARDIOVERSION;   Surgeon: [name], MD;  Location: [place];  Service: Cardiovascular;  Laterality: N/A;  . CARDIOVERSION N/A 11/26/2015   Procedure: CARDIOVERSION;  Surgeon: Sanda Klein, MD;  Location: [facility] ENDOSCOPY;  Service: Cardiovascular;  Laterality: N/A;  . CORONARY ANGIOPLASTY WITH STENT PLACEMENT    . ESOPHAGOGASTRODUODENOSCOPY    . TEE WITH CARDIOVERSION  7/08  . TEE WITHOUT CARDIOVERSION  09/14/2011   Procedure: TRANSESOPHAGEAL ECHOCARDIOGRAM (TEE);  Surgeon: Josue Hector, MD;  Location: [facility] ENDOSCOPY;  Service: Cardiovascular;  Laterality: N/A;  . TEE WITHOUT CARDIOVERSION N/A 06/22/2014   Procedure: TRANSESOPHAGEAL ECHOCARDIOGRAM (TEE);  Surgeon: [name], MD;  Location: [facility] ENDOSCOPY;  Service: Cardiovascular;  Laterality: N/A;  . TRANSURETHRAL RESECTION OF PROSTATE      ROS:  As stated in the HPI and negative for all other systems.  PHYSICAL EXAM BP (!) 152/88   Pulse (!) 49   Ht 5\' 11"  (1.803 m)   Wt 190 lb (86.2 kg)   SpO2 91%   BMI 26.50 kg/m  GENERAL: No acute distress NECK:  No jugular venous distention, waveform within normal limits, carotid upstroke brisk and symmetric, no bruits, no thyromegaly LUNGS:  Clear to auscultation bilaterally BACK:  No CVA tenderness CHEST:  Unremarkable HEART:  PMI not displaced or sustained,S1 and S2 within normal limits, no S3, no S4, no clicks, no rubs, apical and axillary late systolic murmur ABD:  Flat, positive bowel sounds normal in frequency in pitch, no bruits, no rebound, no guarding, no midline pulsatile mass, no hepatomegaly, no splenomegaly EXT:  2 plus pulses throughout, no edema, no cyanosis no clubbing   ASSESSMENT AND PLAN  ATRIAL FIBRILLATION - He has had no recurrent dysrhythmia since the recent cardioversion. I reviewed liver enzymes and TSH from 8/03 from the [place].    BETA BLOCKER INTOLERANCE - This was added to his list of allergies.  CORONARY ARTERY DISEASE -  The patient has no new sypmtoms.  No  further cardiovascular testing is indicated.  We will continue with aggressive risk reduction and meds as listed.    HYPERTENSION -  The blood pressure is at target. No change in medications is indicated. We will continue with therapeutic lifestyle changes (TLC).  MITRAL REGURGITATION -  This was moderate on the last echo and we will follow this clinically.   FATIGUE - Unchanged

## 2016-03-23 ENCOUNTER — Ambulatory Visit (INDEPENDENT_AMBULATORY_CARE_PROVIDER_SITE_OTHER): Payer: Medicare Other | Admitting: Pharmacist Clinician (PhC)/ Clinical Pharmacy Specialist

## 2016-03-23 ENCOUNTER — Ambulatory Visit: Payer: Medicare Other | Admitting: Cardiology

## 2016-03-23 ENCOUNTER — Encounter: Payer: Self-pay | Admitting: Cardiology

## 2016-03-23 ENCOUNTER — Ambulatory Visit (INDEPENDENT_AMBULATORY_CARE_PROVIDER_SITE_OTHER): Payer: Medicare Other | Admitting: Cardiology

## 2016-03-23 VITALS — BP 152/88 | HR 49 | Ht 71.0 in | Wt 190.0 lb

## 2016-03-23 DIAGNOSIS — Z5181 Encounter for therapeutic drug level monitoring: Secondary | ICD-10-CM

## 2016-03-23 DIAGNOSIS — R5382 Chronic fatigue, unspecified: Secondary | ICD-10-CM

## 2016-03-23 DIAGNOSIS — I4891 Unspecified atrial fibrillation: Secondary | ICD-10-CM | POA: Diagnosis not present

## 2016-03-23 DIAGNOSIS — I480 Paroxysmal atrial fibrillation: Secondary | ICD-10-CM

## 2016-03-23 DIAGNOSIS — Z7901 Long term (current) use of anticoagulants: Secondary | ICD-10-CM

## 2016-03-23 LAB — POCT INR: INR: 2.6

## 2016-03-23 NOTE — Patient Instructions (Signed)
Your physician recommends that you schedule a follow-up appointment in: 3 Months  

## 2016-05-01 ENCOUNTER — Ambulatory Visit (INDEPENDENT_AMBULATORY_CARE_PROVIDER_SITE_OTHER): Payer: Medicare Other | Admitting: Pharmacist

## 2016-05-01 DIAGNOSIS — Z5181 Encounter for therapeutic drug level monitoring: Secondary | ICD-10-CM | POA: Diagnosis not present

## 2016-05-01 LAB — POCT INR: INR: 2.1

## 2016-05-12 ENCOUNTER — Ambulatory Visit (INDEPENDENT_AMBULATORY_CARE_PROVIDER_SITE_OTHER): Payer: Medicare Other

## 2016-05-12 DIAGNOSIS — Z23 Encounter for immunization: Secondary | ICD-10-CM | POA: Diagnosis not present

## 2016-05-16 ENCOUNTER — Telehealth: Payer: Self-pay | Admitting: Cardiology

## 2016-05-16 ENCOUNTER — Encounter (HOSPITAL_COMMUNITY): Payer: Self-pay | Admitting: Nurse Practitioner

## 2016-05-16 ENCOUNTER — Ambulatory Visit (HOSPITAL_COMMUNITY)
Admission: RE | Admit: 2016-05-16 | Discharge: 2016-05-16 | Disposition: A | Discharge status: Home / Self Care | Payer: Medicare Other | Source: Ambulatory Visit | Attending: Nurse Practitioner | Admitting: Nurse Practitioner

## 2016-05-16 VITALS — BP 136/82 | HR 120 | Ht 71.0 in | Wt 186.2 lb

## 2016-05-16 DIAGNOSIS — I480 Paroxysmal atrial fibrillation: Secondary | ICD-10-CM | POA: Insufficient documentation

## 2016-05-16 DIAGNOSIS — K2270 Barrett's esophagus without dysplasia: Secondary | ICD-10-CM | POA: Insufficient documentation

## 2016-05-16 DIAGNOSIS — I10 Essential (primary) hypertension: Secondary | ICD-10-CM | POA: Diagnosis not present

## 2016-05-16 DIAGNOSIS — F329 Major depressive disorder, single episode, unspecified: Secondary | ICD-10-CM | POA: Insufficient documentation

## 2016-05-16 DIAGNOSIS — N400 Enlarged prostate without lower urinary tract symptoms: Secondary | ICD-10-CM | POA: Insufficient documentation

## 2016-05-16 DIAGNOSIS — I2510 Atherosclerotic heart disease of native coronary artery without angina pectoris: Secondary | ICD-10-CM | POA: Diagnosis not present

## 2016-05-16 DIAGNOSIS — E785 Hyperlipidemia, unspecified: Secondary | ICD-10-CM | POA: Diagnosis not present

## 2016-05-16 DIAGNOSIS — K219 Gastro-esophageal reflux disease without esophagitis: Secondary | ICD-10-CM | POA: Diagnosis not present

## 2016-05-16 DIAGNOSIS — E039 Hypothyroidism, unspecified: Secondary | ICD-10-CM | POA: Insufficient documentation

## 2016-05-16 DIAGNOSIS — Z7901 Long term (current) use of anticoagulants: Secondary | ICD-10-CM | POA: Diagnosis not present

## 2016-05-16 LAB — COMPREHENSIVE METABOLIC PANEL
ALK PHOS: 79 U/L (ref 38–126)
ALT: 39 U/L (ref 17–63)
AST: 55 U/L — AB (ref 15–41)
Albumin: 3.6 g/dL (ref 3.5–5.0)
Anion gap: 6 (ref 5–15)
BILIRUBIN TOTAL: 0.5 mg/dL (ref 0.3–1.2)
BUN: 13 mg/dL (ref 6–20)
CALCIUM: 9.2 mg/dL (ref 8.9–10.3)
CHLORIDE: 107 mmol/L (ref 101–111)
CO2: 26 mmol/L (ref 22–32)
CREATININE: 1.23 mg/dL (ref 0.61–1.24)
GFR, EST AFRICAN AMERICAN: 59 mL/min — AB (ref >60)
GFR, EST NON AFRICAN AMERICAN: 51 mL/min — AB (ref >60)
Glucose, Bld: 99 mg/dL (ref 65–99)
Potassium: 4.3 mmol/L (ref 3.5–5.1)
Sodium: 139 mmol/L (ref 135–145)
TOTAL PROTEIN: 6.7 g/dL (ref 6.5–8.1)

## 2016-05-16 LAB — PROTIME-INR
INR: 1.89
Prothrombin Time: 22 seconds — ABNORMAL HIGH (ref 11.4–15.2)

## 2016-05-16 LAB — CBC
HEMATOCRIT: 40.7 % (ref 39.0–52.0)
Hemoglobin: 13.4 g/dL (ref 13.0–17.0)
MCH: 32.1 pg (ref 26.0–34.0)
MCHC: 32.9 g/dL (ref 30.0–36.0)
MCV: 97.6 fL (ref 78.0–100.0)
Platelets: 189 10*3/uL (ref 150–400)
RBC: 4.17 MIL/uL — AB (ref 4.22–5.81)
RDW: 14.6 % (ref 11.5–15.5)
WBC: 6.9 10*3/uL (ref 4.0–10.5)

## 2016-05-16 LAB — TSH: TSH: 3.762 u[IU]/mL (ref 0.350–4.500)

## 2016-05-16 NOTE — Telephone Encounter (Signed)
Received incoming call from patient's wife, ok per DPR. She stated her husband is back in A fib. He has been taking his HR this morning and its been 80, 113, and up to 118. Blood pressures have been 89/70, 95/65, and most currently 103/72. Patient has taken his medications this morning as prescribed. Patient reports some SOB and weakness. She says this is usually what happens a few times a year and they need to go to the A fib clinic to be seen. I questioned wife if patient needed to go to the Emergency room, she said this is not an emergency just what usually happens and we get an appt and have a cardioversion.  Called A Fib clinic and appt scheduled for this afternoon at 1:30pm.   Patient's wife notified of appt time and October parking deck code given. She verbalized understanding.

## 2016-05-16 NOTE — Telephone Encounter (Signed)
New message      Wife states patient's HR is 118.  No other symptoms except he is weak.  Please advise

## 2016-05-16 NOTE — Patient Instructions (Signed)
Take amiodarone 200mg  tonight  Cardioversion scheduled for Wednesday, October 31st  - Arrive at the [facility] and go to admitting at 11:30AM  -Do not eat or drink anything after midnight the night prior to your procedure.  - Take all your medication with a sip of water prior to arrival.  - You will not be able to drive home after your procedure.  Follow up with Dr. Percival Spanish as scheduled in December. Call if issues arise before then.

## 2016-05-17 ENCOUNTER — Ambulatory Visit (HOSPITAL_BASED_OUTPATIENT_CLINIC_OR_DEPARTMENT_OTHER): Payer: Medicare Other

## 2016-05-17 ENCOUNTER — Encounter (HOSPITAL_COMMUNITY)
Admission: RE | Disposition: A | Discharge status: Home / Self Care | Payer: Self-pay | Source: Ambulatory Visit | Attending: Cardiovascular Disease

## 2016-05-17 ENCOUNTER — Ambulatory Visit (HOSPITAL_COMMUNITY): Payer: Medicare Other | Admitting: Anesthesiology

## 2016-05-17 ENCOUNTER — Ambulatory Visit (HOSPITAL_COMMUNITY)
Admission: RE | Admit: 2016-05-17 | Discharge: 2016-05-17 | Disposition: A | Discharge status: Home / Self Care | Payer: Medicare Other | Source: Ambulatory Visit | Attending: Cardiovascular Disease | Admitting: Cardiovascular Disease

## 2016-05-17 ENCOUNTER — Encounter (HOSPITAL_COMMUNITY): Payer: Self-pay | Admitting: *Deleted

## 2016-05-17 DIAGNOSIS — I4891 Unspecified atrial fibrillation: Secondary | ICD-10-CM

## 2016-05-17 DIAGNOSIS — Z7901 Long term (current) use of anticoagulants: Secondary | ICD-10-CM | POA: Insufficient documentation

## 2016-05-17 DIAGNOSIS — E039 Hypothyroidism, unspecified: Secondary | ICD-10-CM | POA: Insufficient documentation

## 2016-05-17 DIAGNOSIS — Z955 Presence of coronary angioplasty implant and graft: Secondary | ICD-10-CM | POA: Diagnosis not present

## 2016-05-17 DIAGNOSIS — I2510 Atherosclerotic heart disease of native coronary artery without angina pectoris: Secondary | ICD-10-CM | POA: Insufficient documentation

## 2016-05-17 DIAGNOSIS — I481 Persistent atrial fibrillation: Secondary | ICD-10-CM | POA: Diagnosis not present

## 2016-05-17 DIAGNOSIS — I080 Rheumatic disorders of both mitral and aortic valves: Secondary | ICD-10-CM | POA: Insufficient documentation

## 2016-05-17 DIAGNOSIS — I480 Paroxysmal atrial fibrillation: Secondary | ICD-10-CM | POA: Insufficient documentation

## 2016-05-17 DIAGNOSIS — D649 Anemia, unspecified: Secondary | ICD-10-CM | POA: Diagnosis not present

## 2016-05-17 DIAGNOSIS — E785 Hyperlipidemia, unspecified: Secondary | ICD-10-CM | POA: Insufficient documentation

## 2016-05-17 DIAGNOSIS — K219 Gastro-esophageal reflux disease without esophagitis: Secondary | ICD-10-CM | POA: Diagnosis not present

## 2016-05-17 DIAGNOSIS — F329 Major depressive disorder, single episode, unspecified: Secondary | ICD-10-CM | POA: Diagnosis not present

## 2016-05-17 DIAGNOSIS — Z79899 Other long term (current) drug therapy: Secondary | ICD-10-CM | POA: Diagnosis not present

## 2016-05-17 DIAGNOSIS — G4730 Sleep apnea, unspecified: Secondary | ICD-10-CM | POA: Diagnosis not present

## 2016-05-17 DIAGNOSIS — I10 Essential (primary) hypertension: Secondary | ICD-10-CM | POA: Diagnosis not present

## 2016-05-17 DIAGNOSIS — I340 Nonrheumatic mitral (valve) insufficiency: Secondary | ICD-10-CM

## 2016-05-17 DIAGNOSIS — N400 Enlarged prostate without lower urinary tract symptoms: Secondary | ICD-10-CM | POA: Diagnosis not present

## 2016-05-17 HISTORY — PX: CARDIOVERSION: SHX1299

## 2016-05-17 HISTORY — PX: TEE WITHOUT CARDIOVERSION: SHX5443

## 2016-05-17 LAB — PROTIME-INR
INR: 2.11
Prothrombin Time: 24 seconds — ABNORMAL HIGH (ref 11.4–15.2)

## 2016-05-17 SURGERY — ECHOCARDIOGRAM, TRANSESOPHAGEAL
Anesthesia: Monitor Anesthesia Care

## 2016-05-17 MED ORDER — PROPOFOL 10 MG/ML IV BOLUS
INTRAVENOUS | Status: DC | PRN
  Administered 2016-05-17 (×8): 30 mg via INTRAVENOUS

## 2016-05-17 MED ORDER — LIDOCAINE HCL (CARDIAC) 20 MG/ML IV SOLN
INTRAVENOUS | Status: DC | PRN
  Administered 2016-05-17: 50 mg via INTRATRACHEAL

## 2016-05-17 MED ORDER — SODIUM CHLORIDE 0.9 % IV SOLN
INTRAVENOUS | Status: DC
  Administered 2016-05-17: 13:00:00 via INTRAVENOUS

## 2016-05-17 MED ORDER — BUTAMBEN-TETRACAINE-BENZOCAINE 2-2-14 % EX AERO
INHALATION_SPRAY | CUTANEOUS | Status: DC | PRN
  Administered 2016-05-17: 2 via TOPICAL

## 2016-05-17 NOTE — Anesthesia Preprocedure Evaluation (Signed)
Anesthesia Evaluation  Patient identified by MRN, date of birth, ID band Patient awake    Reviewed: Allergy & Precautions, NPO status , Patient's Chart, lab work & pertinent test results  Airway Mallampati: II  TM Distance: >3 FB Neck ROM: Full    Dental  (+) Teeth Intact, Dental Advisory Given   Pulmonary sleep apnea ,    breath sounds clear to auscultation       Cardiovascular hypertension, Pt. on medications + CAD and + Peripheral Vascular Disease  + dysrhythmias Atrial Fibrillation  Rhythm:Regular Rate:Normal     Neuro/Psych PSYCHIATRIC DISORDERS Depression negative neurological ROS     GI/Hepatic Neg liver ROS, GERD  Medicated,  Endo/Other  Hypothyroidism   Renal/GU negative Renal ROS  negative genitourinary   Musculoskeletal negative musculoskeletal ROS (+)   Abdominal   Peds negative pediatric ROS (+)  Hematology negative hematology ROS (+)   Anesthesia Other Findings   Reproductive/Obstetrics negative OB ROS                             Lab Results  Component Value Date   WBC 6.9 05/16/2016   HGB 13.4 05/16/2016   HCT 40.7 05/16/2016   MCV 97.6 05/16/2016   PLT 189 05/16/2016   Lab Results  Component Value Date   CREATININE 1.23 05/16/2016   BUN 13 05/16/2016   NA 139 05/16/2016   K 4.3 05/16/2016   CL 107 05/16/2016   CO2 26 05/16/2016   Lab Results  Component Value Date   INR 2.11 05/17/2016   INR 1.89 05/16/2016   INR 2.1 05/01/2016   PROTIME 18.7 12/24/2008   PROTIME 21.6 12/17/2008   04/2016 EKG: atrial fibrillation.   Anesthesia Physical Anesthesia Plan  ASA: III  Anesthesia Plan: MAC   Post-op Pain Management:    Induction: Intravenous  Airway Management Planned: Natural Airway  Additional Equipment:   Intra-op Plan:   Post-operative Plan:   Informed Consent: I have reviewed the patients History and Physical, chart, labs and discussed  the procedure including the risks, benefits and alternatives for the proposed anesthesia with the patient or authorized representative who has indicated his/her understanding and acceptance.     Plan Discussed with: CRNA  Anesthesia Plan Comments:         Anesthesia Quick Evaluation

## 2016-05-17 NOTE — Progress Notes (Signed)
Patient ID: [name], male   DOB: January 11, 1927, 82 y.o.   MRN: [id number]     Primary Care Physician: Nyoka Cowden, MD Referring Physician: Dr. Kae Heller [name] is a 82 y.o. male with a h/o persistent afib that is in [place] clinic today for f/u of successful cardioversion, 5/17. He has been maintaining SR. Overall feels weak, but this a chronic complaint. Has not noticed any irregular heart beat since cardioversion.He is back to 200 mg of amiodarone, bumped up to 400 mg a few days prior to cardioversion.  [name] asked to urgently be evaluated 10/31 for not feelinw ell since yesterday pm. He checked his HR and BP this am and discovered he was back in afib with RVR. He becomes very weak with Afib, and finds it difficult to walk any distance. He has been requiring around two cardioversion's a year. He is requesting a cardioversion this pm but first available is tomorrow. INR is subtherapeutic today, but otherwise monthly checks have been in range since last April and will require a TEE guided cardioversion.  Today, he denies symptoms of   chest pain, orthopnea, PND, lower extremity edema, dizziness, presyncope, syncope, or neurologic sequela. Positive for weakness, shortness of breath with exertion when in afib..The patient is tolerating medications without difficulties and is otherwise without complaint today.   Past Medical History:  Diagnosis Date  . Anemia   . Barrett's esophagus   . BENIGN PROSTATIC HYPERTROPHY   . Chronic anticoagulation    a. on Coumadin  . CORONARY ARTERY DISEASE    a. s/p stent OM 2002. b. s/p BMS 2006 RCA  c. s/p cath 2007.  [name] DEPRESSION   . DIVERTICULOSIS, COLON   . GERD   . GYNECOMASTIA, UNILATERAL   . Hematoma of abdominal wall   . History of echocardiogram    a. EF 55% (TEE 06/2014)  . HYPERLIPIDEMIA   . HYPERTENSION   . HYPOTHYROIDISM   . Mallory - Weiss tear    a. > 5 years ago  . Mitral regurgitation    a. Mild-mod by TEE  06/2014.  [name] Paroxysmal a-fib ([facility])    a. chronic amiodarone and coumadin;  b. 05/2013 s/p DCCV. c. 06/2014 s/p TEE/DCCV.  [name] Popliteal artery embolism, right ([place])   . SLEEP APNEA    CPAP   Past Surgical History:  Procedure Laterality Date  . cardioversion  8/08  . CARDIOVERSION  09/14/2011   Procedure: CARDIOVERSION;  Surgeon: Josue Hector, MD;  Location: [place];  Service: Cardiovascular;  Laterality: N/A;  . CARDIOVERSION N/A 03/23/2014   Procedure: CARDIOVERSION;  Surgeon: Thompson Grayer, MD;  Location: [place];  Service: Cardiovascular;  Laterality: N/A;  . CARDIOVERSION N/A 06/22/2014   Procedure: CARDIOVERSION;  Surgeon: [name], MD;  Location: [facility] ENDOSCOPY;  Service: Cardiovascular;  Laterality: N/A;  . CARDIOVERSION N/A 04/27/2015   Procedure: CARDIOVERSION;  Surgeon: Josue Hector, MD;  Location: [facility] ENDOSCOPY;  Service: Cardiovascular;  Laterality: N/A;  . CARDIOVERSION N/A 07/16/2015   Procedure: CARDIOVERSION;  Surgeon: [name], MD;  Location: [place];  Service: Cardiovascular;  Laterality: N/A;  . CARDIOVERSION N/A 11/26/2015   Procedure: CARDIOVERSION;  Surgeon: Sanda Klein, MD;  Location: [facility] ENDOSCOPY;  Service: Cardiovascular;  Laterality: N/A;  . CORONARY ANGIOPLASTY WITH STENT PLACEMENT    . ESOPHAGOGASTRODUODENOSCOPY    . TEE WITH CARDIOVERSION  7/08  . TEE WITHOUT CARDIOVERSION  09/14/2011   Procedure: TRANSESOPHAGEAL ECHOCARDIOGRAM (TEE);  Surgeon:  Josue Hector, MD;  Location: [place];  Service: Cardiovascular;  Laterality: N/A;  . TEE WITHOUT CARDIOVERSION N/A 06/22/2014   Procedure: TRANSESOPHAGEAL ECHOCARDIOGRAM (TEE);  Surgeon: [name], MD;  Location: [facility] ENDOSCOPY;  Service: Cardiovascular;  Laterality: N/A;  . TRANSURETHRAL RESECTION OF PROSTATE      Current Outpatient Prescriptions  Medication Sig Dispense Refill  . amiodarone (PACERONE) 200 MG tablet Take 1 tablet (200 mg total) by mouth daily. Starting on February  14 th 60 tablet 11  . amLODipine (NORVASC) 5 MG tablet Take 1 tablet (5 mg total) by mouth daily. 30 tablet 3  . clonazePAM (KLONOPIN) 1 MG tablet Take 1 tablet (1 mg total) by mouth at bedtime. 30 tablet 5  . DIGESTIVE ENZYMES PO Take 1 tablet by mouth 2 (two) times daily.     . ferrous sulfate 325 (65 FE) MG tablet Take 325 mg by mouth 2 (two) times daily with a meal.    . finasteride (PROSCAR) 5 MG tablet Take 5 mg by mouth daily.  11  . fish oil-omega-3 fatty acids 1000 MG capsule Take 1 g by mouth 2 (two) times daily.     . hydroxypropyl methylcellulose (ISOPTO TEARS) 2.5 % ophthalmic solution Place 1 drop into both eyes 3 (three) times daily as needed for dry eyes.     [name] levothyroxine (SYNTHROID, LEVOTHROID) 50 MCG tablet Take 50 mcg by mouth See admin instructions. On Tuesday thursdays and saturdays patient stats he takes a full tablet then take a half along with it on these days. 1 whole tablet rest of days    . Multiple Vitamin (MULTIVITAMIN) capsule Take 1 capsule by mouth daily.    . nitroGLYCERIN (NITROSTAT) 0.4 MG SL tablet Place 0.4 mg under the tongue every 5 (five) minutes as needed for chest pain.     . pantoprazole (PROTONIX) 40 MG tablet Take 40 mg by mouth 2 (two) times daily.     . pravastatin (PRAVACHOL) 40 MG tablet Take 40 mg by mouth every evening.    . traZODone (DESYREL) 50 MG tablet Take by mouth at bedtime as needed for sleep.     [name] warfarin (COUMADIN) 5 MG tablet Take 5mg  on Monday Wednesday and fridays. Then take half of the 5mg  rest of days     No current facility-administered medications for this encounter.     Allergies  Allergen Reactions  . Metoprolol Tartrate Other (See Comments)    Anxiety, heart rate goes too low, feels like in a fog.    Social History   Social History  . Marital status: Married    Spouse name: N/A  . Number of children: 1  . Years of education: N/A   Occupational History  . Retired    Social History Main Topics  . Smoking  status: Never Smoker  . Smokeless tobacco: Never Used  . Alcohol use No  . Drug use: No  . Sexual activity: Not on file   Other Topics Concern  . Not on file   Social History Narrative   He is retired from [employer].   Mother lived into her 54's.  Father died in his 44's.  No CAD.    Family History  Problem Relation Age of Onset  . Heart attack Mother   . Hypertension Other   . Heart disease Brother     CAD male 1st degree relative  . Stroke Neg Hx     ROS- All systems are reviewed and negative  except as per the HPI above  Physical Exam: Vitals:   05/16/16 1331  BP: 136/82  Pulse: (!) 120  Weight: 186 lb 3.2 oz (84.5 kg)  Height: 5\' 11"  (1.803 m)    GEN- The patient is weak appearing, alert and oriented x 3 today.   Head- normocephalic, atraumatic Eyes-  Sclera clear, conjunctiva pink Ears- hearing intact Oropharynx- clear Neck- supple, no JVP Lymph- no cervical lymphadenopathy Lungs- Clear to ausculation bilaterally, normal work of breathing Heart- Rapid, iregular rate and rhythm, no murmurs, rubs or gallops, PMI not laterally displaced GI- soft, NT, ND, + BS Extremities- no clubbing, cyanosis, or edema MS- no significant deformity or atrophy Skin- no rash or lesion Psych- euthymic mood, full affect Neuro- strength and sensation are intact  EKG-afib ,IRBBB,120 bpm, qrs int 106 ms, qtc 489 ms Epic records reviewed  Assessment and Plan: 1.Symptomatic Paroxysmal afib Will plan on TEE guided cardioversion 11/1 Take an extra 200 mg amiodarone this pm Increase coumadin to 5mg  tonight for INR of 1.89 (2.5 usual dose on Tuesday's) with INR in the AM before procedure Continue amiodarone at 200 mg a day after procedure  F/u with Dr. Percival Spanish 12/7 Afib clinic as needed   Butch Penny C. Alok Minshall, [facility] [address] [place], [place] [address] [phone]

## 2016-05-17 NOTE — Interval H&P Note (Signed)
History and Physical Interval Note:  05/17/2016 1:07 PM  [name]  has presented today for surgery, with the diagnosis of AFIB  The various methods of treatment have been discussed with the patient and family. After consideration of risks, benefits and other options for treatment, the patient has consented to  Procedure(s): TRANSESOPHAGEAL ECHOCARDIOGRAM (TEE) (N/A) CARDIOVERSION (N/A) as a surgical intervention .  The patient's history has been reviewed, patient examined, no change in status, stable for surgery.  I have reviewed the patient's chart and labs.  Questions were answered to the patient's satisfaction.     Mertie Moores

## 2016-05-17 NOTE — Progress Notes (Signed)
  Echocardiogram Echocardiogram Transesophageal has been performed.  [place], [place] 05/17/2016, 2:48 PM

## 2016-05-17 NOTE — Anesthesia Postprocedure Evaluation (Signed)
Anesthesia Post Note  Patient: [name]  Procedure(s) Performed: Procedure(s) (LRB): TRANSESOPHAGEAL ECHOCARDIOGRAM (TEE) (N/A) CARDIOVERSION (N/A)  Patient location during evaluation: PACU Anesthesia Type: MAC Level of consciousness: awake and alert Pain management: pain level controlled Vital Signs Assessment: post-procedure vital signs reviewed and stable Respiratory status: spontaneous breathing, nonlabored ventilation, respiratory function stable and patient connected to nasal cannula oxygen Cardiovascular status: stable and blood pressure returned to baseline Anesthetic complications: no    Last Vitals:  Vitals:   05/17/16 1408 05/17/16 1416  BP: 117/71 112/78  Pulse: (!) 57 (!) 59  Resp: 15 17  Temp:      Last Pain:  Vitals:   05/17/16 1357  TempSrc: Oral                 Effie Berkshire

## 2016-05-17 NOTE — H&P (View-Only) (Signed)
Patient ID: [name], male   DOB: Jul 06, 1927, 82 y.o.   MRN: [id number]     Primary Care Physician: Nyoka Cowden, MD Referring Physician: Dr. Kae Heller [name] is a 82 y.o. male with a h/o persistent afib that is in [facility] today for f/u of successful cardioversion, 5/17. He has been maintaining SR. Overall feels weak, but this a chronic complaint. Has not noticed any irregular heart beat since cardioversion.He is back to 200 mg of amiodarone, bumped up to 400 mg a few days prior to cardioversion.  [name] asked to urgently be evaluated 10/31 for not feelinw ell since yesterday pm. He checked his HR and BP this am and discovered he was back in afib with RVR. He becomes very weak with Afib, and finds it difficult to walk any distance. He has been requiring around two cardioversion's a year. He is requesting a cardioversion this pm but first available is tomorrow. INR is subtherapeutic today, but otherwise monthly checks have been in range since last April and will require a TEE guided cardioversion.  Today, he denies symptoms of   chest pain, orthopnea, PND, lower extremity edema, dizziness, presyncope, syncope, or neurologic sequela. Positive for weakness, shortness of breath with exertion when in afib..The patient is tolerating medications without difficulties and is otherwise without complaint today.   Past Medical History:  Diagnosis Date  . Anemia   . Barrett's esophagus   . BENIGN PROSTATIC HYPERTROPHY   . Chronic anticoagulation    a. on Coumadin  . CORONARY ARTERY DISEASE    a. s/p stent OM 2002. b. s/p BMS 2006 RCA  c. s/p cath 2007.  [name] DEPRESSION   . DIVERTICULOSIS, COLON   . GERD   . GYNECOMASTIA, UNILATERAL   . Hematoma of abdominal wall   . History of echocardiogram    a. EF 55% (TEE 06/2014)  . HYPERLIPIDEMIA   . HYPERTENSION   . HYPOTHYROIDISM   . Mallory - Weiss tear    a. > 5 years ago  . Mitral regurgitation    a. Mild-mod by TEE  06/2014.  [name] Paroxysmal a-fib (HCC)    a. chronic amiodarone and coumadin;  b. 05/2013 s/p DCCV. c. 06/2014 s/p TEE/DCCV.  [name] Popliteal artery embolism, right ([place])   . SLEEP APNEA    CPAP   Past Surgical History:  Procedure Laterality Date  . cardioversion  8/08  . CARDIOVERSION  09/14/2011   Procedure: CARDIOVERSION;  Surgeon: Josue Hector, MD;  Location: [place];  Service: Cardiovascular;  Laterality: N/A;  . CARDIOVERSION N/A 03/23/2014   Procedure: CARDIOVERSION;  Surgeon: Thompson Grayer, MD;  Location: [place];  Service: Cardiovascular;  Laterality: N/A;  . CARDIOVERSION N/A 06/22/2014   Procedure: CARDIOVERSION;  Surgeon: [name], MD;  Location: [facility] ENDOSCOPY;  Service: Cardiovascular;  Laterality: N/A;  . CARDIOVERSION N/A 04/27/2015   Procedure: CARDIOVERSION;  Surgeon: Josue Hector, MD;  Location: [facility] ENDOSCOPY;  Service: Cardiovascular;  Laterality: N/A;  . CARDIOVERSION N/A 07/16/2015   Procedure: CARDIOVERSION;  Surgeon: [name], MD;  Location: [place];  Service: Cardiovascular;  Laterality: N/A;  . CARDIOVERSION N/A 11/26/2015   Procedure: CARDIOVERSION;  Surgeon: Sanda Klein, MD;  Location: [facility] ENDOSCOPY;  Service: Cardiovascular;  Laterality: N/A;  . CORONARY ANGIOPLASTY WITH STENT PLACEMENT    . ESOPHAGOGASTRODUODENOSCOPY    . TEE WITH CARDIOVERSION  7/08  . TEE WITHOUT CARDIOVERSION  09/14/2011   Procedure: TRANSESOPHAGEAL ECHOCARDIOGRAM (TEE);  Surgeon:  Josue Hector, MD;  Location: [place];  Service: Cardiovascular;  Laterality: N/A;  . TEE WITHOUT CARDIOVERSION N/A 06/22/2014   Procedure: TRANSESOPHAGEAL ECHOCARDIOGRAM (TEE);  Surgeon: [name], MD;  Location: [facility] ENDOSCOPY;  Service: Cardiovascular;  Laterality: N/A;  . TRANSURETHRAL RESECTION OF PROSTATE      Current Outpatient Prescriptions  Medication Sig Dispense Refill  . amiodarone (PACERONE) 200 MG tablet Take 1 tablet (200 mg total) by mouth daily. Starting on February  14 th 60 tablet 11  . amLODipine (NORVASC) 5 MG tablet Take 1 tablet (5 mg total) by mouth daily. 30 tablet 3  . clonazePAM (KLONOPIN) 1 MG tablet Take 1 tablet (1 mg total) by mouth at bedtime. 30 tablet 5  . DIGESTIVE ENZYMES PO Take 1 tablet by mouth 2 (two) times daily.     . ferrous sulfate 325 (65 FE) MG tablet Take 325 mg by mouth 2 (two) times daily with a meal.    . finasteride (PROSCAR) 5 MG tablet Take 5 mg by mouth daily.  11  . fish oil-omega-3 fatty acids 1000 MG capsule Take 1 g by mouth 2 (two) times daily.     . hydroxypropyl methylcellulose (ISOPTO TEARS) 2.5 % ophthalmic solution Place 1 drop into both eyes 3 (three) times daily as needed for dry eyes.     [name] levothyroxine (SYNTHROID, LEVOTHROID) 50 MCG tablet Take 50 mcg by mouth See admin instructions. On Tuesday thursdays and saturdays patient stats he takes a full tablet then take a half along with it on these days. 1 whole tablet rest of days    . Multiple Vitamin (MULTIVITAMIN) capsule Take 1 capsule by mouth daily.    . nitroGLYCERIN (NITROSTAT) 0.4 MG SL tablet Place 0.4 mg under the tongue every 5 (five) minutes as needed for chest pain.     . pantoprazole (PROTONIX) 40 MG tablet Take 40 mg by mouth 2 (two) times daily.     . pravastatin (PRAVACHOL) 40 MG tablet Take 40 mg by mouth every evening.    . traZODone (DESYREL) 50 MG tablet Take by mouth at bedtime as needed for sleep.     [name] warfarin (COUMADIN) 5 MG tablet Take 5mg  on Monday Wednesday and fridays. Then take half of the 5mg  rest of days     No current facility-administered medications for this encounter.     Allergies  Allergen Reactions  . Metoprolol Tartrate Other (See Comments)    Anxiety, heart rate goes too low, feels like in a fog.    Social History   Social History  . Marital status: Married    Spouse name: N/A  . Number of children: 1  . Years of education: N/A   Occupational History  . Retired    Social History Main Topics  . Smoking  status: Never Smoker  . Smokeless tobacco: Never Used  . Alcohol use No  . Drug use: No  . Sexual activity: Not on file   Other Topics Concern  . Not on file   Social History Narrative   He is retired from [employer]scape [employer].   Mother lived into her 38's.  Father died in his 64's.  No CAD.    Family History  Problem Relation Age of Onset  . Heart attack Mother   . Hypertension Other   . Heart disease Brother     CAD male 1st degree relative  . Stroke Neg Hx     ROS- All systems are reviewed and negative  except as per the HPI above  Physical Exam: Vitals:   05/16/16 1331  BP: 136/82  Pulse: (!) 120  Weight: 186 lb 3.2 oz (84.5 kg)  Height: 5\' 11"  (1.803 m)    GEN- The patient is weak appearing, alert and oriented x 3 today.   Head- normocephalic, atraumatic Eyes-  Sclera clear, conjunctiva pink Ears- hearing intact Oropharynx- clear Neck- supple, no JVP Lymph- no cervical lymphadenopathy Lungs- Clear to ausculation bilaterally, normal work of breathing Heart- Rapid, iregular rate and rhythm, no murmurs, rubs or gallops, PMI not laterally displaced GI- soft, NT, ND, + BS Extremities- no clubbing, cyanosis, or edema MS- no significant deformity or atrophy Skin- no rash or lesion Psych- euthymic mood, full affect Neuro- strength and sensation are intact  EKG-afib ,IRBBB,120 bpm, qrs int 106 ms, qtc 489 ms Epic records reviewed  Assessment and Plan: 1.Symptomatic Paroxysmal afib Will plan on TEE guided cardioversion 11/1 Take an extra 200 mg amiodarone this pm Increase coumadin to 5mg  tonight for INR of 1.89 (2.5 usual dose on Tuesday's) with INR in the AM before procedure Continue amiodarone at 200 mg a day after procedure  F/u with Dr. Percival Spanish 12/7 Afib clinic as needed   Butch Penny C. Mariann Palo, [facility] [address] [place], [place] [address] [phone]

## 2016-05-17 NOTE — Progress Notes (Signed)
  Echocardiogram 2D Echocardiogram has been performed.  [name], [name] 05/17/2016, 2:01 PM

## 2016-05-17 NOTE — Transfer of Care (Signed)
Immediate Anesthesia Transfer of Care Note  Patient: [name]  Procedure(s) Performed: Procedure(s): TRANSESOPHAGEAL ECHOCARDIOGRAM (TEE) (N/A) CARDIOVERSION (N/A)  Patient Location: PACU  Anesthesia Type:MAC  Level of Consciousness: awake, alert  and oriented  Airway & Oxygen Therapy: Patient Spontanous Breathing and Patient connected to nasal cannula oxygen  Post-op Assessment: Report given to RN, Post -op Vital signs reviewed and stable and Patient moving all extremities X 4  Post vital signs: Reviewed and stable  Last Vitals:  Vitals:   05/17/16 1149  BP: 127/81  Pulse: (!) 115  Resp: 16    Last Pain:  Vitals:   05/17/16 1149  TempSrc: Oral         Complications: No apparent anesthesia complications

## 2016-05-17 NOTE — CV Procedure (Signed)
    Transesophageal Echocardiogram Note  HARSHAAN FRAKES [id number] May 07, 1927  Procedure: Transesophageal Echocardiogram Indications: atrial fib  Procedure Details Consent: Obtained Time Out: Verified patient identification, verified procedure, site/side was marked, verified correct patient position, special equipment/implants available, Radiology Safety Procedures followed,  medications/allergies/relevent history reviewed, required imaging and test results available.  Performed  Medications:  During this procedure the patient is administered a total of Propofol 200 mg by anesthesia for the TEE and the cardioversion.l    The patient's heart rate, blood pressure, and oxygen saturation are monitored continuously during the procedure.   Left Ventrical:  Normal LV function    Mitral Valve: moderate MR  Aortic Valve: mild AI   Tricuspid Valve: mild TR   Pulmonic Valve: mild PI   Left Atrium/ Left atrial appendage: no thrombi in the LAA   Atrial septum: not visualized   Aorta: not visualized    Complications: No apparent complications Patient did tolerate procedure well.      Cardioversion Note  ALIJIAH GOHR [id number] June 25, 1927  Procedure: DC Cardioversion Indications: atrial fib   Procedure Details Consent: Obtained Time Out: Verified patient identification, verified procedure, site/side was marked, verified correct patient position, special equipment/implants available, Radiology Safety Procedures followed,  medications/allergies/relevent history reviewed, required imaging and test results available.  Performed  The patient has been on adequate anticoagulation.  The patient received IV Propofol 200 mg for the TEE and cardioversion  for sedation ( also mentioned above ) .  Synchronous cardioversion was performed at 120  joules.  The cardioversion was successful     Complications: No apparent complications Patient did tolerate procedure well.   Thayer Headings, Brooke Bonito., MD, [facility] 05/17/2016, 1:48 PM

## 2016-05-25 DIAGNOSIS — R3912 Poor urinary stream: Secondary | ICD-10-CM | POA: Diagnosis not present

## 2016-06-13 ENCOUNTER — Ambulatory Visit (INDEPENDENT_AMBULATORY_CARE_PROVIDER_SITE_OTHER): Payer: Medicare Other | Admitting: Pharmacist Clinician (PhC)/ Clinical Pharmacy Specialist

## 2016-06-13 DIAGNOSIS — Z5181 Encounter for therapeutic drug level monitoring: Secondary | ICD-10-CM

## 2016-06-13 LAB — POCT INR: INR: 2.9

## 2016-06-21 NOTE — Progress Notes (Signed)
HPI The patient presents for followup after recent visits for atrial fibrillation. Since I last saw him he was back in atrial fib in late October.  He had another elective cardioversion.  Since then he has done OK.  He now has a new red scooter!   The patient denies any new symptoms such as chest discomfort, neck or arm discomfort. There has been no new shortness of breath, PND or orthopnea. There have been no reported palpitations, presyncope or syncope.  Allergies  Allergen Reactions  . Metoprolol Tartrate Other (See Comments)    Anxiety, heart rate goes too low, feels like in a fog.    Current Outpatient Prescriptions  Medication Sig Dispense Refill  . amiodarone (PACERONE) 200 MG tablet Take 1 tablet (200 mg total) by mouth daily. Starting on February 14 th 60 tablet 11  . amLODipine (NORVASC) 5 MG tablet Take 1 tablet (5 mg total) by mouth daily. 30 tablet 3  . clonazePAM (KLONOPIN) 1 MG tablet Take 1 tablet (1 mg total) by mouth at bedtime. 30 tablet 5  . DIGESTIVE ENZYMES PO Take 1 tablet by mouth 2 (two) times daily.     . ferrous sulfate 325 (65 FE) MG tablet Take 325 mg by mouth 2 (two) times daily with a meal.    . finasteride (PROSCAR) 5 MG tablet Take 5 mg by mouth daily.  11  . fish oil-omega-3 fatty acids 1000 MG capsule Take 1 g by mouth 2 (two) times daily.     . hydroxypropyl methylcellulose (ISOPTO TEARS) 2.5 % ophthalmic solution Place 1 drop into both eyes 3 (three) times daily as needed for dry eyes.     [name] levothyroxine (SYNTHROID, LEVOTHROID) 50 MCG tablet Take 50 mcg by mouth See admin instructions. On Tuesday thursdays and saturdays patient stats he takes a full tablet then take a half along with it on these days. 1 whole tablet rest of days    . Multiple Vitamin (MULTIVITAMIN) capsule Take 1 capsule by mouth daily.    . nitroGLYCERIN (NITROSTAT) 0.4 MG SL tablet Place 0.4 mg under the tongue every 5 (five) minutes as needed for chest pain.     . pantoprazole  (PROTONIX) 40 MG tablet Take 40 mg by mouth 2 (two) times daily.     . pravastatin (PRAVACHOL) 40 MG tablet Take 40 mg by mouth every evening.    . traZODone (DESYREL) 50 MG tablet Take by mouth at bedtime as needed for sleep.     [name] warfarin (COUMADIN) 5 MG tablet Take 5mg  on Monday Wednesday and fridays. Then take half of the 5mg  rest of days     No current facility-administered medications for this visit.     Past Medical History:  Diagnosis Date  . Anemia   . Barrett's esophagus   . BENIGN PROSTATIC HYPERTROPHY   . Chronic anticoagulation    a. on Coumadin  . CORONARY ARTERY DISEASE    a. s/p stent OM 2002. b. s/p BMS 2006 RCA  c. s/p cath 2007.  [name] DEPRESSION   . DIVERTICULOSIS, COLON   . GERD   . GYNECOMASTIA, UNILATERAL   . Hematoma of abdominal wall   . History of echocardiogram    a. EF 55% (TEE 06/2014)  . HYPERLIPIDEMIA   . HYPERTENSION   . HYPOTHYROIDISM   . Mallory - Weiss tear    a. > 5 years ago  . Mitral regurgitation    a. Mild-mod by TEE 06/2014.  [name] Paroxysmal a-fib (  [place])    a. chronic amiodarone and coumadin;  b. 05/2013 s/p DCCV. c. 06/2014 s/p TEE/DCCV.  [name] Popliteal artery embolism, right ([place])   . SLEEP APNEA    CPAP    Past Surgical History:  Procedure Laterality Date  . cardioversion  8/08  . CARDIOVERSION  09/14/2011   Procedure: CARDIOVERSION;  Surgeon: Josue Hector, MD;  Location: [facility];  Service: Cardiovascular;  Laterality: N/A;  . CARDIOVERSION N/A 03/23/2014   Procedure: CARDIOVERSION;  Surgeon: Thompson Grayer, MD;  Location: [place];  Service: Cardiovascular;  Laterality: N/A;  . CARDIOVERSION N/A 06/22/2014   Procedure: CARDIOVERSION;  Surgeon: [name], MD;  Location: [facility] ENDOSCOPY;  Service: Cardiovascular;  Laterality: N/A;  . CARDIOVERSION N/A 04/27/2015   Procedure: CARDIOVERSION;  Surgeon: Josue Hector, MD;  Location: [facility] ENDOSCOPY;  Service: Cardiovascular;  Laterality: N/A;  . CARDIOVERSION N/A 07/16/2015   Procedure:  CARDIOVERSION;  Surgeon: [name], MD;  Location: [place];  Service: Cardiovascular;  Laterality: N/A;  . CARDIOVERSION N/A 11/26/2015   Procedure: CARDIOVERSION;  Surgeon: Sanda Klein, MD;  Location: [facility] ENDOSCOPY;  Service: Cardiovascular;  Laterality: N/A;  . CARDIOVERSION N/A 05/17/2016   Procedure: CARDIOVERSION;  Surgeon: Thayer Headings, MD;  Location: [place];  Service: Cardiovascular;  Laterality: N/A;  . CORONARY ANGIOPLASTY WITH STENT PLACEMENT    . ESOPHAGOGASTRODUODENOSCOPY    . TEE WITH CARDIOVERSION  7/08  . TEE WITHOUT CARDIOVERSION  09/14/2011   Procedure: TRANSESOPHAGEAL ECHOCARDIOGRAM (TEE);  Surgeon: Josue Hector, MD;  Location: [facility] ENDOSCOPY;  Service: Cardiovascular;  Laterality: N/A;  . TEE WITHOUT CARDIOVERSION N/A 06/22/2014   Procedure: TRANSESOPHAGEAL ECHOCARDIOGRAM (TEE);  Surgeon: [name], MD;  Location: [facility] ENDOSCOPY;  Service: Cardiovascular;  Laterality: N/A;  . TEE WITHOUT CARDIOVERSION N/A 05/17/2016   Procedure: TRANSESOPHAGEAL ECHOCARDIOGRAM (TEE);  Surgeon: Thayer Headings, MD;  Location: [place];  Service: Cardiovascular;  Laterality: N/A;  . TRANSURETHRAL RESECTION OF PROSTATE      ROS:  As stated in the HPI and negative for all other systems.  PHYSICAL EXAM BP (!) 144/78   Pulse 62   Ht 5\' 10"  (1.778 m)   Wt 186 lb 9.6 oz (84.6 kg)   BMI 26.77 kg/m  GENERAL: No acute distress but frail NECK:  No jugular venous distention, waveform within normal limits, carotid upstroke brisk and symmetric, no bruits, no thyromegaly LUNGS:  Clear to auscultation bilaterally BACK:  No CVA tenderness CHEST:  Unremarkable HEART:  PMI not displaced or sustained,S1 and S2 within normal limits, no S3, no S4, no clicks, no rubs, apical and axillary late systolic murmur ABD:  Flat, positive bowel sounds normal in frequency in pitch, no bruits, no rebound, no guarding, no midline pulsatile mass, no hepatomegaly, no splenomegaly EXT:  2 plus  pulses throughout, trace edema, no cyanosis no clubbing  Lab Results  Component Value Date   TSH 3.762 05/16/2016   ALT 39 05/16/2016   AST 55 (H) 05/16/2016   ALKPHOS 79 05/16/2016   BILITOT 0.5 05/16/2016   PROT 6.7 05/16/2016   ALBUMIN 3.6 05/16/2016     ASSESSMENT AND PLAN  ATRIAL FIBRILLATION - Mr. WISIN BRANSTROM has a CHA2DS2 - VASc score of 5 with a risk of stroke of 6.7%.   No change in therapy is planned.  He requires cardioversion about 3 times per year which is a stable pattern.   He is up to date with labs as above.  CARDIOMYOPATHY - EF  is stable and slightly low.  He is euvolemic.   BETA BLOCKER INTOLERANCE - This was added to his list of allergies.  CORONARY ARTERY DISEASE -  The patient has no new sypmtoms.  No further cardiovascular testing is indicated.  We will continue with aggressive risk reduction and meds as listed.    HYPERTENSION -  The blood pressure is at target. No change in medications is indicated. We will continue with therapeutic lifestyle changes (TLC).  MITRAL REGURGITATION -  This was moderate on the recent TEE and we will follow this clinically.   FATIGUE - Unchanged

## 2016-06-22 ENCOUNTER — Encounter: Payer: Self-pay | Admitting: Cardiology

## 2016-06-22 ENCOUNTER — Ambulatory Visit (INDEPENDENT_AMBULATORY_CARE_PROVIDER_SITE_OTHER): Payer: Medicare Other | Admitting: Cardiology

## 2016-06-22 VITALS — BP 144/78 | HR 62 | Ht 70.0 in | Wt 186.6 lb

## 2016-06-22 DIAGNOSIS — R5382 Chronic fatigue, unspecified: Secondary | ICD-10-CM

## 2016-06-22 DIAGNOSIS — I480 Paroxysmal atrial fibrillation: Secondary | ICD-10-CM

## 2016-06-22 DIAGNOSIS — I2510 Atherosclerotic heart disease of native coronary artery without angina pectoris: Secondary | ICD-10-CM | POA: Diagnosis not present

## 2016-06-22 DIAGNOSIS — Z7901 Long term (current) use of anticoagulants: Secondary | ICD-10-CM | POA: Diagnosis not present

## 2016-06-22 NOTE — Patient Instructions (Signed)
Medication Instructions:  Continue current medication  Labwork: None Ordered  Testing/Procedures: None Ordered  Follow-Up: Your physician recommends that you schedule a follow-up appointment in: 3 Months with Rhonda Barrett.   Any Other Special Instructions Will Be Listed Below (If Applicable).   If you need a refill on your cardiac medications before your next appointment, please call your pharmacy.

## 2016-07-25 ENCOUNTER — Ambulatory Visit (INDEPENDENT_AMBULATORY_CARE_PROVIDER_SITE_OTHER): Payer: Medicare Other | Admitting: Pharmacist

## 2016-07-25 DIAGNOSIS — Z5181 Encounter for therapeutic drug level monitoring: Secondary | ICD-10-CM | POA: Diagnosis not present

## 2016-07-25 LAB — POCT INR: INR: 2.1

## 2016-08-01 ENCOUNTER — Encounter: Payer: Self-pay | Admitting: Family Medicine

## 2016-08-01 ENCOUNTER — Ambulatory Visit (INDEPENDENT_AMBULATORY_CARE_PROVIDER_SITE_OTHER): Payer: Medicare Other | Admitting: Family Medicine

## 2016-08-01 VITALS — BP 120/82 | HR 58 | Temp 97.8°F | Wt 172.2 lb

## 2016-08-01 DIAGNOSIS — M5431 Sciatica, right side: Secondary | ICD-10-CM

## 2016-08-01 MED ORDER — TRAMADOL HCL 50 MG PO TABS: 50.0000 mg | ORAL_TABLET | Freq: Three times a day (TID) | ORAL | 0 refills | Status: DC | PRN

## 2016-08-01 NOTE — Progress Notes (Signed)
History of Present Illness:    [name] is a 83 y.o. male who presents for evaluation of right hip and upper leg pain. The patient has had similar episodes of hip and leg pain in the past. Symptoms have been present for a few days and are unchanged.  Onset was related to / precipitated by no known injury. The pain is described as aching and stiffness and occurs all day. He rates his pain as moderate. Symptoms are exacerbated by standing and walking. Symptoms are improved by Tramadol. Patient denies saddle anesthesia, incontinence, numbness, tingling, or muscle weakness.   PMHx, SurgHx, SocialHx, Medications, and Allergies were reviewed in the Visit Navigator and updated as appropriate.   Past Medical History:   Past Medical History:  Diagnosis Date  . Anemia   . Barrett's esophagus   . BENIGN PROSTATIC HYPERTROPHY   . Chronic anticoagulation    a. on Coumadin  . CORONARY ARTERY DISEASE    a. s/p stent OM 2002. b. s/p BMS 2006 RCA  c. s/p cath 2007.  [name] DEPRESSION   . DIVERTICULOSIS, COLON   . GERD   . GYNECOMASTIA, UNILATERAL   . Hematoma of abdominal wall   . History of echocardiogram    a. EF 55% (TEE 06/2014)  . HYPERLIPIDEMIA   . HYPERTENSION   . HYPOTHYROIDISM   . Mallory - Weiss tear    a. > 5 years ago  . Mitral regurgitation    a. Mild-mod by TEE 06/2014.  [name] Paroxysmal a-fib (HCC)    a. chronic amiodarone and coumadin;  b. 05/2013 s/p DCCV. c. 06/2014 s/p TEE/DCCV.  [name] Popliteal artery embolism, right ([place])   . SLEEP APNEA    CPAP     Past Surgical History:   Past Surgical History:  Procedure Laterality Date  . cardioversion  8/08  . CARDIOVERSION  09/14/2011   Procedure: CARDIOVERSION;  Surgeon: Josue Hector, MD;  Location: [place];  Service: Cardiovascular;  Laterality: N/A;  . CARDIOVERSION N/A 03/23/2014   Procedure: CARDIOVERSION;  Surgeon: Thompson Grayer, MD;  Location: [place];  Service: Cardiovascular;  Laterality: N/A;  . CARDIOVERSION N/A  06/22/2014   Procedure: CARDIOVERSION;  Surgeon: [name], MD;  Location: [facility] ENDOSCOPY;  Service: Cardiovascular;  Laterality: N/A;  . CARDIOVERSION N/A 04/27/2015   Procedure: CARDIOVERSION;  Surgeon: Josue Hector, MD;  Location: [facility] ENDOSCOPY;  Service: Cardiovascular;  Laterality: N/A;  . CARDIOVERSION N/A 07/16/2015   Procedure: CARDIOVERSION;  Surgeon: [name], MD;  Location: [place];  Service: Cardiovascular;  Laterality: N/A;  . CARDIOVERSION N/A 11/26/2015   Procedure: CARDIOVERSION;  Surgeon: Sanda Klein, MD;  Location: [facility] ENDOSCOPY;  Service: Cardiovascular;  Laterality: N/A;  . CARDIOVERSION N/A 05/17/2016   Procedure: CARDIOVERSION;  Surgeon: Thayer Headings, MD;  Location: [place];  Service: Cardiovascular;  Laterality: N/A;  . CORONARY ANGIOPLASTY WITH STENT PLACEMENT    . ESOPHAGOGASTRODUODENOSCOPY    . TEE WITH CARDIOVERSION  7/08  . TEE WITHOUT CARDIOVERSION  09/14/2011   Procedure: TRANSESOPHAGEAL ECHOCARDIOGRAM (TEE);  Surgeon: Josue Hector, MD;  Location: [facility] ENDOSCOPY;  Service: Cardiovascular;  Laterality: N/A;  . TEE WITHOUT CARDIOVERSION N/A 06/22/2014   Procedure: TRANSESOPHAGEAL ECHOCARDIOGRAM (TEE);  Surgeon: [name], MD;  Location: [facility] ENDOSCOPY;  Service: Cardiovascular;  Laterality: N/A;  . TEE WITHOUT CARDIOVERSION N/A 05/17/2016   Procedure: TRANSESOPHAGEAL ECHOCARDIOGRAM (TEE);  Surgeon: Thayer Headings, MD;  Location: [place];  Service: Cardiovascular;  Laterality: N/A;  .  TRANSURETHRAL RESECTION OF PROSTATE       Allergies:   Allergies  Allergen Reactions  . Metoprolol Tartrate Other (See Comments)    Anxiety, heart rate goes too low, feels like in a fog.     Current Medications:   Prior to Admission medications   Medication Sig Start Date End Date Taking? Authorizing Provider  amiodarone (PACERONE) 200 MG tablet Take 1 tablet (200 mg total) by mouth daily. Starting on February 14 th 12/02/15  Yes Sherran Needs, NP  amLODipine (NORVASC) 5 MG tablet Take 1 tablet (5 mg total) by mouth daily. 07/29/15  Yes Minus Breeding, MD  clonazePAM (KLONOPIN) 1 MG tablet Take 1 tablet (1 mg total) by mouth at bedtime. 01/08/15  Yes Marletta Lor, MD  DIGESTIVE ENZYMES PO Take 1 tablet by mouth 2 (two) times daily.    Yes Historical Provider, MD  ferrous sulfate 325 (65 FE) MG tablet Take 325 mg by mouth 2 (two) times daily with a meal.   Yes Historical Provider, MD  finasteride (PROSCAR) 5 MG tablet Take 5 mg by mouth daily. 12/22/14  Yes Historical Provider, MD  fish oil-omega-3 fatty acids 1000 MG capsule Take 1 g by mouth 2 (two) times daily.    Yes Historical Provider, MD  hydroxypropyl methylcellulose (ISOPTO TEARS) 2.5 % ophthalmic solution Place 1 drop into both eyes 3 (three) times daily as needed for dry eyes.    Yes Historical Provider, MD  levothyroxine (SYNTHROID, LEVOTHROID) 50 MCG tablet Take 50 mcg by mouth See admin instructions. On Tuesday thursdays and saturdays patient stats he takes a full tablet then take a half along with it on these days. 1 whole tablet rest of days   Yes Historical Provider, MD  Multiple Vitamin (MULTIVITAMIN) capsule Take 1 capsule by mouth daily.   Yes Historical Provider, MD  nitroGLYCERIN (NITROSTAT) 0.4 MG SL tablet Place 0.4 mg under the tongue every 5 (five) minutes as needed for chest pain.    Yes Historical Provider, MD  pantoprazole (PROTONIX) 40 MG tablet Take 40 mg by mouth 2 (two) times daily.    Yes Historical Provider, MD  pravastatin (PRAVACHOL) 40 MG tablet Take 40 mg by mouth every evening. 03/07/11  Yes Imogene Burn, PA-C  traZODone (DESYREL) 50 MG tablet Take by mouth at bedtime as needed for sleep.    Yes Historical Provider, MD  warfarin (COUMADIN) 5 MG tablet Take 5mg  on Monday Wednesday and fridays. Then take half of the 5mg  rest of days   Yes Historical Provider, MD  traMADol (ULTRAM) 50 MG tablet Take 1 tablet (50 mg total) by mouth every 8  (eight) hours as needed. 08/01/16   Briscoe Deutscher, DO     Social History:   Social History  Substance Use Topics  . Smoking status: Never Smoker  . Smokeless tobacco: Never Used  . Alcohol use No     Review of Systems:   No unusual headaches, no dizziness. No prolonged cough. No dyspnea or chest pain on exertion. No abdominal pain, change in bowel habits, black or bloody stools.  No urinary tract symptoms. No edema.   Physical Exam:   Vitals:   08/01/16 1550  BP: 120/82  Pulse: (!) 58  Temp: 97.8 F (36.6 C)  TempSrc: Oral  SpO2: 98%  Weight: 172 lb 3.2 oz (78.1 kg)     Body mass index is 24.71 kg/m.  General: Alert, cooperative, no distress.  Heart:: RRR, no murmur, gallop or  rub. Lungs: Normal effort, clear to auscultation bilaterally, no wheezes, rhonchi, rales. MSK: Inspection and palpation: antalgic gait. Muscle tone and ROM exam: limited range of motion with pain. Straight leg raise: positive at right Neurological: normal DTRs Extremities: No lower extremity edema.  Skin: No rashes or lesions.   Assessment and Plan:    [place] was seen today for hip pain.  Diagnoses and all orders for this visit:  Right sided sciatica, likely secondary to OA Comments: Hx of the same. No red flags. See orders below. Precautions reviewed.  Orders: -     traMADol (ULTRAM) 50 MG tablet; Take 1 tablet (50 mg total) by mouth every 8 (eight) hours as needed. Previously tolerated.  - Tylenol [phone] mg TID scheduled. - Consider imaging, PT if not improving.   . Reviewed expectations re: course of current medical issues. . Discussed self-management of symptoms. . Outlined signs and symptoms indicating need for more acute intervention. . Patient verbalized understanding and all questions were answered. . See orders for this visit as documented in the electronic medical record. . Patient received an After Visit Summary.   Briscoe Deutscher, D.O.

## 2016-08-01 NOTE — Progress Notes (Signed)
Pre visit review using our clinic review tool, if applicable. No additional management support is needed unless otherwise documented below in the visit note. 

## 2016-09-05 ENCOUNTER — Ambulatory Visit (INDEPENDENT_AMBULATORY_CARE_PROVIDER_SITE_OTHER): Payer: Medicare Other | Admitting: Pharmacist Clinician (PhC)/ Clinical Pharmacy Specialist

## 2016-09-05 DIAGNOSIS — Z5181 Encounter for therapeutic drug level monitoring: Secondary | ICD-10-CM

## 2016-09-05 LAB — POCT INR: INR: 2.4

## 2016-09-15 ENCOUNTER — Telehealth: Payer: Self-pay | Admitting: Cardiology

## 2016-09-15 ENCOUNTER — Telehealth (HOSPITAL_COMMUNITY): Payer: Self-pay | Admitting: *Deleted

## 2016-09-15 NOTE — Telephone Encounter (Signed)
Spoke with pt wife, questions regarding amiodarone dosage answered.

## 2016-09-15 NOTE — Telephone Encounter (Signed)
New Message    Pt wife states that pt heart is out of rhythm again, she is going to call the [facility] to see if they can get him in today

## 2016-09-15 NOTE — Telephone Encounter (Signed)
Pt spouse cld reporting pt out of rhythm.  BP is 124/90 and HR is 116.  Pt is currently taking amiodarone 200 mg qd and no other meds for rate control.  Per Rushie Goltz, RN pt should increase amiodarone 200 mg to bid and come in Monday.  Pt wife understood instructions and made appt for Monday.

## 2016-09-16 ENCOUNTER — Other Ambulatory Visit: Payer: Self-pay | Admitting: Cardiology

## 2016-09-18 ENCOUNTER — Telehealth: Payer: Self-pay | Admitting: Cardiology

## 2016-09-18 ENCOUNTER — Ambulatory Visit (HOSPITAL_COMMUNITY)
Admission: RE | Admit: 2016-09-18 | Discharge: 2016-09-18 | Disposition: A | Discharge status: Home / Self Care | Payer: Medicare Other | Source: Ambulatory Visit | Attending: Nurse Practitioner | Admitting: Nurse Practitioner

## 2016-09-18 ENCOUNTER — Encounter (HOSPITAL_COMMUNITY): Payer: Self-pay | Admitting: Nurse Practitioner

## 2016-09-18 VITALS — BP 126/72 | HR 116 | Ht 70.0 in | Wt 184.8 lb

## 2016-09-18 DIAGNOSIS — I340 Nonrheumatic mitral (valve) insufficiency: Secondary | ICD-10-CM | POA: Diagnosis not present

## 2016-09-18 DIAGNOSIS — E039 Hypothyroidism, unspecified: Secondary | ICD-10-CM | POA: Diagnosis not present

## 2016-09-18 DIAGNOSIS — I481 Persistent atrial fibrillation: Secondary | ICD-10-CM

## 2016-09-18 DIAGNOSIS — F329 Major depressive disorder, single episode, unspecified: Secondary | ICD-10-CM | POA: Diagnosis not present

## 2016-09-18 DIAGNOSIS — G4730 Sleep apnea, unspecified: Secondary | ICD-10-CM | POA: Insufficient documentation

## 2016-09-18 DIAGNOSIS — Z8249 Family history of ischemic heart disease and other diseases of the circulatory system: Secondary | ICD-10-CM | POA: Diagnosis not present

## 2016-09-18 DIAGNOSIS — K2270 Barrett's esophagus without dysplasia: Secondary | ICD-10-CM | POA: Diagnosis not present

## 2016-09-18 DIAGNOSIS — D649 Anemia, unspecified: Secondary | ICD-10-CM | POA: Diagnosis not present

## 2016-09-18 DIAGNOSIS — I4819 Other persistent atrial fibrillation: Secondary | ICD-10-CM

## 2016-09-18 DIAGNOSIS — Z7901 Long term (current) use of anticoagulants: Secondary | ICD-10-CM | POA: Insufficient documentation

## 2016-09-18 DIAGNOSIS — I10 Essential (primary) hypertension: Secondary | ICD-10-CM | POA: Diagnosis not present

## 2016-09-18 DIAGNOSIS — E785 Hyperlipidemia, unspecified: Secondary | ICD-10-CM | POA: Diagnosis not present

## 2016-09-18 DIAGNOSIS — N400 Enlarged prostate without lower urinary tract symptoms: Secondary | ICD-10-CM | POA: Diagnosis not present

## 2016-09-18 DIAGNOSIS — Z95818 Presence of other cardiac implants and grafts: Secondary | ICD-10-CM | POA: Diagnosis not present

## 2016-09-18 DIAGNOSIS — I480 Paroxysmal atrial fibrillation: Secondary | ICD-10-CM | POA: Insufficient documentation

## 2016-09-18 DIAGNOSIS — I2510 Atherosclerotic heart disease of native coronary artery without angina pectoris: Secondary | ICD-10-CM | POA: Diagnosis not present

## 2016-09-18 DIAGNOSIS — I4510 Unspecified right bundle-branch block: Secondary | ICD-10-CM | POA: Diagnosis not present

## 2016-09-18 LAB — COMPREHENSIVE METABOLIC PANEL
ALK PHOS: 70 U/L (ref 38–126)
ALT: 25 U/L (ref 17–63)
ANION GAP: 6 (ref 5–15)
AST: 33 U/L (ref 15–41)
Albumin: 3.6 g/dL (ref 3.5–5.0)
BUN: 19 mg/dL (ref 6–20)
CALCIUM: 9.3 mg/dL (ref 8.9–10.3)
CO2: 27 mmol/L (ref 22–32)
Chloride: 106 mmol/L (ref 101–111)
Creatinine, Ser: 1.32 mg/dL — ABNORMAL HIGH (ref 0.61–1.24)
GFR calc non Af Amer: 47 mL/min — ABNORMAL LOW (ref >60)
GFR, EST AFRICAN AMERICAN: 54 mL/min — AB (ref >60)
Glucose, Bld: 100 mg/dL — ABNORMAL HIGH (ref 65–99)
Potassium: 4 mmol/L (ref 3.5–5.1)
SODIUM: 139 mmol/L (ref 135–145)
Total Bilirubin: 1 mg/dL (ref 0.3–1.2)
Total Protein: 6.6 g/dL (ref 6.5–8.1)

## 2016-09-18 LAB — CBC
HCT: 43.2 % (ref 39.0–52.0)
HEMOGLOBIN: 14.4 g/dL (ref 13.0–17.0)
MCH: 32.4 pg (ref 26.0–34.0)
MCHC: 33.3 g/dL (ref 30.0–36.0)
MCV: 97.3 fL (ref 78.0–100.0)
PLATELETS: 181 10*3/uL (ref 150–400)
RBC: 4.44 MIL/uL (ref 4.22–5.81)
RDW: 14.7 % (ref 11.5–15.5)
WBC: 6.5 10*3/uL (ref 4.0–10.5)

## 2016-09-18 LAB — TSH: TSH: 4.43 u[IU]/mL (ref 0.350–4.500)

## 2016-09-18 NOTE — Telephone Encounter (Signed)
It can be moved up.

## 2016-09-18 NOTE — Telephone Encounter (Signed)
New Message   Per pt wife wants to know if nurse thinks it's a good idea for pt to wait until Friday to have procedure done. Requesting call back

## 2016-09-18 NOTE — Progress Notes (Signed)
Patient ID: [name], male   DOB: 08/12/1926, 83 y.o.   MRN: [id number]     Primary Care Physician: KWIATKOWSKI,PETER FRANK, MD Referring Physician: Dr. Hochrein   [name] is a 83 y.o. male with a h/o persistent afib that is in afib clinic today for  Onset of afib on Friday. He is on amiodarone but still has occasional breakthrough afib. He required 4 cardioversion in the last 13 months. He states that he gets very weak in afib and finds it hard to do anything. He does not tolerate BB's due to low energy, anxiety and fog. He is on warfarin and last INR was therapeutic 2/20 at 2.4.He is very anxious to have cardioversion since he does not want to wait for the 4 weekly INR's,by guidelines, he will require TEE guided cardioversion. He will increase amioderone to 200 mg bid until cardioversion. States that his heart rate is slower at home, some rates seen in the 60's.  Today, he denies symptoms of  chest pain, orthopnea, PND, lower extremity edema, dizziness, presyncope, syncope, or neurologic sequela. Positive for weakness, shortness of breath with exertion when in afib..The patient is tolerating medications without difficulties and is otherwise without complaint today.   Past Medical History:  Diagnosis Date  . Anemia   . Barrett's esophagus   . BENIGN PROSTATIC HYPERTROPHY   . Chronic anticoagulation    a. on Coumadin  . CORONARY ARTERY DISEASE    a. s/p stent OM 2002. b. s/p BMS 2006 RCA  c. s/p cath 2007.  . DEPRESSION   . DIVERTICULOSIS, COLON   . GERD   . GYNECOMASTIA, UNILATERAL   . Hematoma of abdominal wall   . History of echocardiogram    a. EF 55% (TEE 06/2014)  . HYPERLIPIDEMIA   . HYPERTENSION   . HYPOTHYROIDISM   . Mallory - Weiss tear    a. > 5 years ago  . Mitral regurgitation    a. Mild-mod by TEE 06/2014.  . Paroxysmal a-fib (HCC)    a. chronic amiodarone and coumadin;  b. 05/2013 s/p DCCV. c. 06/2014 s/p TEE/DCCV.  . Popliteal artery embolism, right  (HCC)   . SLEEP APNEA    CPAP   Past Surgical History:  Procedure Laterality Date  . cardioversion  8/08  . CARDIOVERSION  09/14/2011   Procedure: CARDIOVERSION;  Surgeon: Peter C Nishan, MD;  Location: [facility] ENDOSCOPY;  Service: Cardiovascular;  Laterality: N/A;  . CARDIOVERSION N/A 03/23/2014   Procedure: CARDIOVERSION;  Surgeon: James Allred, MD;  Location: [facility] OR;  Service: Cardiovascular;  Laterality: N/A;  . CARDIOVERSION N/A 06/22/2014   Procedure: CARDIOVERSION;  Surgeon: Kenneth C. Hilty, MD;  Location: [facility] ENDOSCOPY;  Service: Cardiovascular;  Laterality: N/A;  . CARDIOVERSION N/A 04/27/2015   Procedure: CARDIOVERSION;  Surgeon: Peter C Nishan, MD;  Location: [facility] ENDOSCOPY;  Service: Cardiovascular;  Laterality: N/A;  . CARDIOVERSION N/A 07/16/2015   Procedure: CARDIOVERSION;  Surgeon: Katarina H Nelson, MD;  Location: [facility] ENDOSCOPY;  Service: Cardiovascular;  Laterality: N/A;  . CARDIOVERSION N/A 11/26/2015   Procedure: CARDIOVERSION;  Surgeon: Mihai Croitoru, MD;  Location: [facility] ENDOSCOPY;  Service: Cardiovascular;  Laterality: N/A;  . CARDIOVERSION N/A 05/17/2016   Procedure: CARDIOVERSION;  Surgeon: Philip J Nahser, MD;  Location: [facility] ENDOSCOPY;  Service: Cardiovascular;  Laterality: N/A;  . CORONARY ANGIOPLASTY WITH STENT PLACEMENT    . ESOPHAGOGASTRODUODENOSCOPY    . TEE WITH CARDIOVERSION  7/08  . TEE WITHOUT CARDIOVERSION  09/14/2011   Procedure: TRANSESOPHAGEAL   ECHOCARDIOGRAM (TEE);  Surgeon: Peter C Nishan, MD;  Location: [facility] ENDOSCOPY;  Service: Cardiovascular;  Laterality: N/A;  . TEE WITHOUT CARDIOVERSION N/A 06/22/2014   Procedure: TRANSESOPHAGEAL ECHOCARDIOGRAM (TEE);  Surgeon: Kenneth C. Hilty, MD;  Location: [facility] ENDOSCOPY;  Service: Cardiovascular;  Laterality: N/A;  . TEE WITHOUT CARDIOVERSION N/A 05/17/2016   Procedure: TRANSESOPHAGEAL ECHOCARDIOGRAM (TEE);  Surgeon: Philip J Nahser, MD;  Location: [facility] ENDOSCOPY;  Service: Cardiovascular;  Laterality: N/A;  . TRANSURETHRAL  RESECTION OF PROSTATE      Current Outpatient Prescriptions  Medication Sig Dispense Refill  . amiodarone (PACERONE) 200 MG tablet Take 1 tablet (200 mg total) by mouth daily. Starting on February 14 th 60 tablet 11  . amLODipine (NORVASC) 5 MG tablet Take 1 tablet (5 mg total) by mouth daily. 30 tablet 3  . clonazePAM (KLONOPIN) 1 MG tablet Take 1 tablet (1 mg total) by mouth at bedtime. 30 tablet 5  . DIGESTIVE ENZYMES PO Take 1 tablet by mouth 2 (two) times daily.     . ferrous sulfate 325 (65 FE) MG tablet Take 325 mg by mouth 2 (two) times daily with a meal.    . finasteride (PROSCAR) 5 MG tablet Take 5 mg by mouth daily.  11  . fish oil-omega-3 fatty acids 1000 MG capsule Take 1 g by mouth 2 (two) times daily.     . hydroxypropyl methylcellulose (ISOPTO TEARS) 2.5 % ophthalmic solution Place 1 drop into both eyes 3 (three) times daily as needed for dry eyes.     . levothyroxine (SYNTHROID, LEVOTHROID) 50 MCG tablet Take 50 mcg by mouth See admin instructions. On Tuesday thursdays and saturdays patient stats he takes a full tablet then take a half along with it on these days. 1 whole tablet rest of days    . Multiple Vitamin (MULTIVITAMIN) capsule Take 1 capsule by mouth daily.    . nitroGLYCERIN (NITROSTAT) 0.4 MG SL tablet Place 0.4 mg under the tongue every 5 (five) minutes as needed for chest pain.     . pantoprazole (PROTONIX) 40 MG tablet Take 40 mg by mouth 2 (two) times daily.     . pravastatin (PRAVACHOL) 40 MG tablet Take 40 mg by mouth every evening.    . traMADol (ULTRAM) 50 MG tablet Take 1 tablet (50 mg total) by mouth every 8 (eight) hours as needed. 30 tablet 0  . traZODone (DESYREL) 50 MG tablet Take by mouth at bedtime as needed for sleep.     . warfarin (COUMADIN) 5 MG tablet Take 5mg on Monday Wednesday and fridays. Then take half of the 5mg rest of days    . warfarin (COUMADIN) 5 MG tablet Take 1/2 to 1 tablet daily or as directed by coumadin clinic 90 tablet 0   No  current facility-administered medications for this encounter.     Allergies  Allergen Reactions  . Metoprolol Tartrate Other (See Comments)    Anxiety, heart rate goes too low, feels like in a fog.    Social History   Social History  . Marital status: Married    Spouse name: N/A  . Number of children: 1  . Years of education: N/A   Occupational History  . Retired    Social History Main Topics  . Smoking status: Never Smoker  . Smokeless tobacco: Never Used  . Alcohol use No  . Drug use: No  . Sexual activity: Not on file   Other Topics Concern  . Not on file     Social History Narrative   He is retired from wallpaper sales.   Mother lived into her 90's.  Father died in his 90's.  No CAD.    Family History  Problem Relation Age of Onset  . Heart attack Mother   . Hypertension Other   . Heart disease Brother     CAD male 1st degree relative  . Stroke Neg Hx     ROS- All systems are reviewed and negative except as per the HPI above  Physical Exam: Vitals:   09/18/16 1045  BP: 126/72  Pulse: (!) 116  Weight: 184 lb 12.8 oz (83.8 kg)  Height: 5' 10" (1.778 m)    GEN- The patient is weak appearing, alert and oriented x 3 today.   Head- normocephalic, atraumatic Eyes-  Sclera clear, conjunctiva pink Ears- hearing intact Oropharynx- clear Neck- supple, no JVP Lymph- no cervical lymphadenopathy Lungs- Clear to ausculation bilaterally, normal work of breathing Heart-  irregular rate and rhythm, no murmurs, rubs or gallops, PMI not laterally displaced GI- soft, NT, ND, + BS Extremities- no clubbing, cyanosis, or edema MS- no significant deformity or atrophy Skin- no rash or lesion Psych- euthymic mood, full affect Neuro- strength and sensation are intact  EKG-afib ,IRBBB,116  bpm, qrs int 108 ms, qtc 489 ms Epic records reviewed  Assessment and Plan: 1.Symptomatic Paroxysmal afib Will plan on TEE guided cardioversion 3/9 200 mg amiodarone bid until  cardioversion then return to one a day. INR day of cardioversion Continue warfarin Last INR 2.4 IF gets unsteady prior to cardioversion, told to proceed to ER.  F/u with Rhonda Barrett, PA as already scheduled 09/28/16 Afib clinic as needed   Kaleisha Bhargava C. Helix Lafontaine, ANP-C Afib Clinic [facility] [address] [place], [place] [address] [phone]  

## 2016-09-18 NOTE — Patient Instructions (Signed)
Your physician has recommended you make the following change in your medication:  1)Decrease Amiodarone to 200mg  once a day -->after cardioversion   Cardioversion scheduled for Friday, March 9th  - Arrive at the [facility] and go to admitting at 9:30AM  -Do not eat or drink anything after midnight the night prior to your procedure.  - Take all your medication with a sip of water prior to arrival.  - You will not be able to drive home after your procedure.

## 2016-09-19 NOTE — Telephone Encounter (Signed)
Spoke with [name] letting him know his Cardioversion is tomorrow and to be at the hospital at 9:00 am, pt voice understanding and thanks

## 2016-09-20 ENCOUNTER — Ambulatory Visit (HOSPITAL_COMMUNITY): Payer: Medicare Other

## 2016-09-20 ENCOUNTER — Ambulatory Visit (HOSPITAL_COMMUNITY)
Admission: RE | Admit: 2016-09-20 | Discharge: 2016-09-20 | Disposition: A | Discharge status: Home / Self Care | Payer: Medicare Other | Source: Ambulatory Visit | Attending: Internal Medicine | Admitting: Internal Medicine

## 2016-09-20 ENCOUNTER — Encounter (HOSPITAL_COMMUNITY)
Admission: RE | Disposition: A | Discharge status: Home / Self Care | Payer: Self-pay | Source: Ambulatory Visit | Attending: Internal Medicine

## 2016-09-20 ENCOUNTER — Ambulatory Visit (HOSPITAL_COMMUNITY): Payer: Medicare Other | Admitting: Certified Registered Nurse Anesthetist

## 2016-09-20 ENCOUNTER — Encounter (HOSPITAL_COMMUNITY): Payer: Self-pay | Admitting: *Deleted

## 2016-09-20 DIAGNOSIS — I4891 Unspecified atrial fibrillation: Secondary | ICD-10-CM | POA: Diagnosis not present

## 2016-09-20 DIAGNOSIS — Z888 Allergy status to other drugs, medicaments and biological substances status: Secondary | ICD-10-CM | POA: Insufficient documentation

## 2016-09-20 DIAGNOSIS — D649 Anemia, unspecified: Secondary | ICD-10-CM | POA: Insufficient documentation

## 2016-09-20 DIAGNOSIS — G4730 Sleep apnea, unspecified: Secondary | ICD-10-CM | POA: Insufficient documentation

## 2016-09-20 DIAGNOSIS — K5730 Diverticulosis of large intestine without perforation or abscess without bleeding: Secondary | ICD-10-CM | POA: Insufficient documentation

## 2016-09-20 DIAGNOSIS — N62 Hypertrophy of breast: Secondary | ICD-10-CM | POA: Insufficient documentation

## 2016-09-20 DIAGNOSIS — F329 Major depressive disorder, single episode, unspecified: Secondary | ICD-10-CM | POA: Insufficient documentation

## 2016-09-20 DIAGNOSIS — Z8249 Family history of ischemic heart disease and other diseases of the circulatory system: Secondary | ICD-10-CM | POA: Diagnosis not present

## 2016-09-20 DIAGNOSIS — K219 Gastro-esophageal reflux disease without esophagitis: Secondary | ICD-10-CM | POA: Insufficient documentation

## 2016-09-20 DIAGNOSIS — E039 Hypothyroidism, unspecified: Secondary | ICD-10-CM | POA: Insufficient documentation

## 2016-09-20 DIAGNOSIS — I340 Nonrheumatic mitral (valve) insufficiency: Secondary | ICD-10-CM

## 2016-09-20 DIAGNOSIS — I2510 Atherosclerotic heart disease of native coronary artery without angina pectoris: Secondary | ICD-10-CM | POA: Diagnosis not present

## 2016-09-20 DIAGNOSIS — I480 Paroxysmal atrial fibrillation: Secondary | ICD-10-CM | POA: Insufficient documentation

## 2016-09-20 DIAGNOSIS — Z86718 Personal history of other venous thrombosis and embolism: Secondary | ICD-10-CM | POA: Diagnosis not present

## 2016-09-20 DIAGNOSIS — E785 Hyperlipidemia, unspecified: Secondary | ICD-10-CM | POA: Insufficient documentation

## 2016-09-20 DIAGNOSIS — I10 Essential (primary) hypertension: Secondary | ICD-10-CM | POA: Diagnosis not present

## 2016-09-20 DIAGNOSIS — N400 Enlarged prostate without lower urinary tract symptoms: Secondary | ICD-10-CM | POA: Diagnosis not present

## 2016-09-20 DIAGNOSIS — I080 Rheumatic disorders of both mitral and aortic valves: Secondary | ICD-10-CM | POA: Diagnosis not present

## 2016-09-20 DIAGNOSIS — K2270 Barrett's esophagus without dysplasia: Secondary | ICD-10-CM | POA: Insufficient documentation

## 2016-09-20 DIAGNOSIS — Z955 Presence of coronary angioplasty implant and graft: Secondary | ICD-10-CM | POA: Insufficient documentation

## 2016-09-20 DIAGNOSIS — Z7901 Long term (current) use of anticoagulants: Secondary | ICD-10-CM | POA: Insufficient documentation

## 2016-09-20 HISTORY — PX: TEE WITHOUT CARDIOVERSION: SHX5443

## 2016-09-20 HISTORY — PX: CARDIOVERSION: SHX1299

## 2016-09-20 LAB — PROTIME-INR
INR: 2.66
Prothrombin Time: 28.9 seconds — ABNORMAL HIGH (ref 11.4–15.2)

## 2016-09-20 SURGERY — CARDIOVERSION
Anesthesia: Monitor Anesthesia Care

## 2016-09-20 MED ORDER — PROPOFOL 500 MG/50ML IV EMUL
INTRAVENOUS | Status: DC | PRN
  Administered 2016-09-20: 50 ug/kg/min via INTRAVENOUS

## 2016-09-20 MED ORDER — BUTAMBEN-TETRACAINE-BENZOCAINE 2-2-14 % EX AERO
INHALATION_SPRAY | CUTANEOUS | Status: DC | PRN
  Administered 2016-09-20: 2 via TOPICAL

## 2016-09-20 MED ORDER — SODIUM CHLORIDE 0.9 % IV SOLN
INTRAVENOUS | Status: DC
  Administered 2016-09-20: 10:00:00 via INTRAVENOUS

## 2016-09-20 MED ORDER — PROPOFOL 10 MG/ML IV BOLUS
INTRAVENOUS | Status: DC | PRN
  Administered 2016-09-20: 20 mg via INTRAVENOUS

## 2016-09-20 MED ORDER — LIDOCAINE HCL (CARDIAC) 20 MG/ML IV SOLN
INTRAVENOUS | Status: DC | PRN
  Administered 2016-09-20: 40 mg via INTRATRACHEAL

## 2016-09-20 NOTE — Anesthesia Preprocedure Evaluation (Addendum)
Anesthesia Evaluation  Patient identified by MRN, date of birth, ID band Patient awake    Reviewed: Allergy & Precautions, NPO status , Patient's Chart, lab work & pertinent test results  Airway Mallampati: I     Mouth opening: Limited Mouth Opening  Dental no notable dental hx.    Pulmonary    Pulmonary exam normal        Cardiovascular hypertension, Normal cardiovascular exam     Neuro/Psych    GI/Hepatic   Endo/Other    Renal/GU      Musculoskeletal   Abdominal Normal abdominal exam  (+)   Peds  Hematology   Anesthesia Other Findings   Reproductive/Obstetrics                            Anesthesia Physical Anesthesia Plan  ASA: II  Anesthesia Plan: General   Post-op Pain Management:    Induction: Intravenous  Airway Management Planned: Natural Airway, Simple Face Mask and Nasal Cannula  Additional Equipment:   Intra-op Plan:   Post-operative Plan:   Informed Consent: I have reviewed the patients History and Physical, chart, labs and discussed the procedure including the risks, benefits and alternatives for the proposed anesthesia with the patient or authorized representative who has indicated his/her understanding and acceptance.     Plan Discussed with: CRNA and Surgeon  Anesthesia Plan Comments:        Anesthesia Quick Evaluation

## 2016-09-20 NOTE — Interval H&P Note (Signed)
History and Physical Interval Note:  09/20/2016 10:05 AM  [name]  has presented today for surgery, with the diagnosis of afib  The various methods of treatment have been discussed with the patient and family. After consideration of risks, benefits and other options for treatment, the patient has consented to  Procedure(s): CARDIOVERSION (N/A) TRANSESOPHAGEAL ECHOCARDIOGRAM (TEE) (N/A) as a surgical intervention .  The patient's history has been reviewed, patient examined, no change in status, stable for surgery.  I have reviewed the patient's chart and labs.  Questions were answered to the patient's satisfaction.     Ena Dawley

## 2016-09-20 NOTE — H&P (View-Only) (Signed)
Patient ID: [name], male   DOB: 08-15-1926, 83 y.o.   MRN: [id number]     Primary Care Physician: Nyoka Cowden, MD Referring Physician: Dr. Kae Heller [name] is a 83 y.o. male with a h/o persistent afib that is in afib clinic today for  Onset of afib on Friday. He is on amiodarone but still has occasional breakthrough afib. He required 4 cardioversion in the last 13 months. He states that he gets very weak in afib and finds it hard to do anything. He does not tolerate BB's due to low energy, anxiety and fog. He is on warfarin and last INR was therapeutic 2/20 at 2.4.He is very anxious to have cardioversion since he does not want to wait for the 4 weekly INR's,by guidelines, he will require TEE guided cardioversion. He will increase amioderone to 200 mg bid until cardioversion. States that his heart rate is slower at home, some rates seen in the 60's.  Today, he denies symptoms of  chest pain, orthopnea, PND, lower extremity edema, dizziness, presyncope, syncope, or neurologic sequela. Positive for weakness, shortness of breath with exertion when in afib..The patient is tolerating medications without difficulties and is otherwise without complaint today.   Past Medical History:  Diagnosis Date  . Anemia   . Barrett's esophagus   . BENIGN PROSTATIC HYPERTROPHY   . Chronic anticoagulation    a. on Coumadin  . CORONARY ARTERY DISEASE    a. s/p stent OM 2002. b. s/p BMS 2006 RCA  c. s/p cath 2007.  [name] DEPRESSION   . DIVERTICULOSIS, COLON   . GERD   . GYNECOMASTIA, UNILATERAL   . Hematoma of abdominal wall   . History of echocardiogram    a. EF 55% (TEE 06/2014)  . HYPERLIPIDEMIA   . HYPERTENSION   . HYPOTHYROIDISM   . Mallory - Weiss tear    a. > 5 years ago  . Mitral regurgitation    a. Mild-mod by TEE 06/2014.  [name] Paroxysmal a-fib (HCC)    a. chronic amiodarone and coumadin;  b. 05/2013 s/p DCCV. c. 06/2014 s/p TEE/DCCV.  [name] Popliteal artery embolism, right  ([place])   . SLEEP APNEA    CPAP   Past Surgical History:  Procedure Laterality Date  . cardioversion  8/08  . CARDIOVERSION  09/14/2011   Procedure: CARDIOVERSION;  Surgeon: Josue Hector, MD;  Location: [place];  Service: Cardiovascular;  Laterality: N/A;  . CARDIOVERSION N/A 03/23/2014   Procedure: CARDIOVERSION;  Surgeon: Thompson Grayer, MD;  Location: [place];  Service: Cardiovascular;  Laterality: N/A;  . CARDIOVERSION N/A 06/22/2014   Procedure: CARDIOVERSION;  Surgeon: [name], MD;  Location: [facility] ENDOSCOPY;  Service: Cardiovascular;  Laterality: N/A;  . CARDIOVERSION N/A 04/27/2015   Procedure: CARDIOVERSION;  Surgeon: Josue Hector, MD;  Location: [facility] ENDOSCOPY;  Service: Cardiovascular;  Laterality: N/A;  . CARDIOVERSION N/A 07/16/2015   Procedure: CARDIOVERSION;  Surgeon: [name], MD;  Location: [place];  Service: Cardiovascular;  Laterality: N/A;  . CARDIOVERSION N/A 11/26/2015   Procedure: CARDIOVERSION;  Surgeon: Sanda Klein, MD;  Location: [facility] ENDOSCOPY;  Service: Cardiovascular;  Laterality: N/A;  . CARDIOVERSION N/A 05/17/2016   Procedure: CARDIOVERSION;  Surgeon: Thayer Headings, MD;  Location: [place];  Service: Cardiovascular;  Laterality: N/A;  . CORONARY ANGIOPLASTY WITH STENT PLACEMENT    . ESOPHAGOGASTRODUODENOSCOPY    . TEE WITH CARDIOVERSION  7/08  . TEE WITHOUT CARDIOVERSION  09/14/2011   Procedure: TRANSESOPHAGEAL  ECHOCARDIOGRAM (TEE);  Surgeon: Josue Hector, MD;  Location: [facility] ENDOSCOPY;  Service: Cardiovascular;  Laterality: N/A;  . TEE WITHOUT CARDIOVERSION N/A 06/22/2014   Procedure: TRANSESOPHAGEAL ECHOCARDIOGRAM (TEE);  Surgeon: [name], MD;  Location: [facility] ENDOSCOPY;  Service: Cardiovascular;  Laterality: N/A;  . TEE WITHOUT CARDIOVERSION N/A 05/17/2016   Procedure: TRANSESOPHAGEAL ECHOCARDIOGRAM (TEE);  Surgeon: Thayer Headings, MD;  Location: [place];  Service: Cardiovascular;  Laterality: N/A;  . TRANSURETHRAL  RESECTION OF PROSTATE      Current Outpatient Prescriptions  Medication Sig Dispense Refill  . amiodarone (PACERONE) 200 MG tablet Take 1 tablet (200 mg total) by mouth daily. Starting on February 14 th 60 tablet 11  . amLODipine (NORVASC) 5 MG tablet Take 1 tablet (5 mg total) by mouth daily. 30 tablet 3  . clonazePAM (KLONOPIN) 1 MG tablet Take 1 tablet (1 mg total) by mouth at bedtime. 30 tablet 5  . DIGESTIVE ENZYMES PO Take 1 tablet by mouth 2 (two) times daily.     . ferrous sulfate 325 (65 FE) MG tablet Take 325 mg by mouth 2 (two) times daily with a meal.    . finasteride (PROSCAR) 5 MG tablet Take 5 mg by mouth daily.  11  . fish oil-omega-3 fatty acids 1000 MG capsule Take 1 g by mouth 2 (two) times daily.     . hydroxypropyl methylcellulose (ISOPTO TEARS) 2.5 % ophthalmic solution Place 1 drop into both eyes 3 (three) times daily as needed for dry eyes.     [name] levothyroxine (SYNTHROID, LEVOTHROID) 50 MCG tablet Take 50 mcg by mouth See admin instructions. On Tuesday thursdays and saturdays patient stats he takes a full tablet then take a half along with it on these days. 1 whole tablet rest of days    . Multiple Vitamin (MULTIVITAMIN) capsule Take 1 capsule by mouth daily.    . nitroGLYCERIN (NITROSTAT) 0.4 MG SL tablet Place 0.4 mg under the tongue every 5 (five) minutes as needed for chest pain.     . pantoprazole (PROTONIX) 40 MG tablet Take 40 mg by mouth 2 (two) times daily.     . pravastatin (PRAVACHOL) 40 MG tablet Take 40 mg by mouth every evening.    . traMADol (ULTRAM) 50 MG tablet Take 1 tablet (50 mg total) by mouth every 8 (eight) hours as needed. 30 tablet 0  . traZODone (DESYREL) 50 MG tablet Take by mouth at bedtime as needed for sleep.     [name] warfarin (COUMADIN) 5 MG tablet Take 5mg  on Monday Wednesday and fridays. Then take half of the 5mg  rest of days    . warfarin (COUMADIN) 5 MG tablet Take 1/2 to 1 tablet daily or as directed by coumadin clinic 90 tablet 0   No  current facility-administered medications for this encounter.     Allergies  Allergen Reactions  . Metoprolol Tartrate Other (See Comments)    Anxiety, heart rate goes too low, feels like in a fog.    Social History   Social History  . Marital status: Married    Spouse name: N/A  . Number of children: 1  . Years of education: N/A   Occupational History  . Retired    Social History Main Topics  . Smoking status: Never Smoker  . Smokeless tobacco: Never Used  . Alcohol use No  . Drug use: No  . Sexual activity: Not on file   Other Topics Concern  . Not on file  Social History Narrative   He is retired from [employer].   Mother lived into her 51's.  Father died in his 52's.  No CAD.    Family History  Problem Relation Age of Onset  . Heart attack Mother   . Hypertension Other   . Heart disease Brother     CAD male 1st degree relative  . Stroke Neg Hx     ROS- All systems are reviewed and negative except as per the HPI above  Physical Exam: Vitals:   09/18/16 1045  BP: 126/72  Pulse: (!) 116  Weight: 184 lb 12.8 oz (83.8 kg)  Height: 5\' 10"  (1.778 m)    GEN- The patient is weak appearing, alert and oriented x 3 today.   Head- normocephalic, atraumatic Eyes-  Sclera clear, conjunctiva pink Ears- hearing intact Oropharynx- clear Neck- supple, no JVP Lymph- no cervical lymphadenopathy Lungs- Clear to ausculation bilaterally, normal work of breathing Heart-  irregular rate and rhythm, no murmurs, rubs or gallops, PMI not laterally displaced GI- soft, NT, ND, + BS Extremities- no clubbing, cyanosis, or edema MS- no significant deformity or atrophy Skin- no rash or lesion Psych- euthymic mood, full affect Neuro- strength and sensation are intact  EKG-afib ,IRBBB,116  bpm, qrs int 108 ms, qtc 489 ms Epic records reviewed  Assessment and Plan: 1.Symptomatic Paroxysmal afib Will plan on TEE guided cardioversion 3/9 200 mg amiodarone bid until  cardioversion then return to one a day. INR day of cardioversion Continue warfarin Last INR 2.4 IF gets unsteady prior to cardioversion, told to proceed to ER.  F/u with Rosaria Ferries, PA as already scheduled 09/28/16 Afib clinic as needed   Geroge Baseman. Inella Kuwahara, [facility] [address] [place], [place] [address] [phone]

## 2016-09-20 NOTE — Transfer of Care (Signed)
Immediate Anesthesia Transfer of Care Note  Patient: [name]  Procedure(s) Performed: Procedure(s): CARDIOVERSION (N/A) TRANSESOPHAGEAL ECHOCARDIOGRAM (TEE) (N/A)  Patient Location: PACU  Anesthesia Type:MAC  Level of Consciousness: awake  Airway & Oxygen Therapy: Patient Spontanous Breathing  Post-op Assessment: Report given to RN, Post -op Vital signs reviewed and stable and Patient moving all extremities X 4  Post vital signs: Reviewed and stable  Last Vitals:  Vitals:   09/20/16 0911  BP: (!) 146/96  Resp: 14  Temp: 36.6 C    Last Pain:  Vitals:   09/20/16 0911  TempSrc: Oral         Complications: No apparent anesthesia complications

## 2016-09-20 NOTE — CV Procedure (Signed)
     Transesophageal Echocardiogram Note  TATSUO MUSIAL [id number] June 15, 1927  Procedure: Transesophageal Echocardiogram Indications: atrial fibrillation.   Procedure Details Consent: Obtained Time Out: Verified patient identification, verified procedure, site/side was marked, verified correct patient position, special equipment/implants available, Radiology Safety Procedures followed,  medications/allergies/relevent history reviewed, required imaging and test results available.  Performed  Medications:  IV propofol administered by anesthesia staff  No intracardiac source of embolism was identified, for full TEE report see results section.   Complications: No apparent complications Patient did tolerate procedure well.  Ena Dawley, MD, [facility] 09/20/2016, 10:44 AM   Cardioversion Note  VAUGHN BEAUMIER [id number] 1926-11-30  Procedure: DC Cardioversion Indications: atrial fibrillation  Procedure Details Consent: Obtained Time Out: Verified patient identification, verified procedure, site/side was marked, verified correct patient position, special equipment/implants available, Radiology Safety Procedures followed,  medications/allergies/relevent history reviewed, required imaging and test results available.  Performed  The patient has been on adequate anticoagulation.  The patient received IV propofol administered by anesthesia staff for sedation.  Synchronous cardioversion was performed at 120 joules.  The cardioversion was successful.   Complications: No apparent complications Patient did tolerate procedure well.   Ena Dawley, MD, [facility] 09/20/2016, 10:44 AM

## 2016-09-20 NOTE — Discharge Instructions (Signed)
TEE ° °YOU HAD AN CARDIAC PROCEDURE TODAY: Refer to the procedure report and other information in the discharge instructions given to you for any specific questions about what was found during the examination. If this information does not answer your questions, please call [facility] office at [phone] to clarify.  ° °DIET: Your first meal following the procedure should be a light meal and then it is ok to progress to your normal diet. A half-sandwich or bowl of soup is an example of a good first meal. Heavy or fried foods are harder to digest and may make you feel nauseous or bloated. Drink plenty of fluids but you should avoid alcoholic beverages for 24 hours. If you had a esophageal dilation, please see attached instructions for diet.  ° °ACTIVITY: Your care partner should take you home directly after the procedure. You should plan to take it easy, moving slowly for the rest of the day. You can resume normal activity the day after the procedure however YOU SHOULD NOT DRIVE, use power tools, machinery or perform tasks that involve climbing or major physical exertion for 24 hours (because of the sedation medicines used during the test).  ° °SYMPTOMS TO REPORT IMMEDIATELY: °A cardiologist can be reached at any hour. Please call [phone] for any of the following symptoms:  °Vomiting of blood or coffee ground material  °New, significant abdominal pain  °New, significant chest pain or pain under the shoulder blades  °Painful or persistently difficult swallowing  °New shortness of breath  °Black, tarry-looking or red, bloody stools ° °FOLLOW UP:  °Please also call with any specific questions about appointments or follow up tests. ° ° °Electrical Cardioversion, Care After °This sheet gives you information about how to care for yourself after your procedure. Your health care provider may also give you more specific instructions. If you have problems or questions, contact your health care provider. °What can I  expect after the procedure? °After the procedure, it is common to have: °· Some redness on the skin where the shocks were given. °Follow these instructions at home: °· Do not drive for 24 hours if you were given a medicine to help you relax (sedative). °· Take over-the-counter and prescription medicines only as told by your health care provider. °· Ask your health care provider how to check your pulse. Check it often. °· Rest for 48 hours after the procedure or as told by your health care provider. °· Avoid or limit your caffeine use as told by your health care provider. °Contact a health care provider if: °· You feel like your heart is beating too quickly or your pulse is not regular. °· You have a serious muscle cramp that does not go away. °Get help right away if: °· You have discomfort in your chest. °· You are dizzy or you feel faint. °· You have trouble breathing or you are short of breath. °· Your speech is slurred. °· You have trouble moving an arm or leg on one side of your body. °· Your fingers or toes turn cold or blue. °This information is not intended to replace advice given to you by your health care provider. Make sure you discuss any questions you have with your health care provider. °Document Released: 04/23/2013 Document Revised: 02/04/2016 Document Reviewed: 01/07/2016 °Elsevier Interactive Patient Education © 2017 [employer] ° °

## 2016-09-21 ENCOUNTER — Encounter (HOSPITAL_COMMUNITY): Payer: Self-pay | Admitting: Cardiology

## 2016-09-21 NOTE — Anesthesia Postprocedure Evaluation (Addendum)
Anesthesia Post Note  Patient: [name]  Procedure(s) Performed: Procedure(s) (LRB): CARDIOVERSION (N/A) TRANSESOPHAGEAL ECHOCARDIOGRAM (TEE) (N/A)  Patient location during evaluation: PACU Anesthesia Type: MAC Level of consciousness: awake Pain management: pain level controlled Vital Signs Assessment: post-procedure vital signs reviewed and stable Respiratory status: spontaneous breathing Cardiovascular status: stable Postop Assessment: no signs of nausea or vomiting Anesthetic complications: no       Last Vitals:  Vitals:   09/20/16 1100 09/20/16 1110  BP: 101/66 135/67  Pulse: (!) 58 (!) 59  Resp: 16 19  Temp:      Last Pain:  Vitals:   09/20/16 1040  TempSrc: Oral                 Nykayla Marcelli JR,JOHN Janaysha Depaulo

## 2016-09-28 ENCOUNTER — Ambulatory Visit (INDEPENDENT_AMBULATORY_CARE_PROVIDER_SITE_OTHER): Payer: Medicare Other | Admitting: Pharmacist Clinician (PhC)/ Clinical Pharmacy Specialist

## 2016-09-28 ENCOUNTER — Ambulatory Visit (INDEPENDENT_AMBULATORY_CARE_PROVIDER_SITE_OTHER): Payer: Medicare Other | Admitting: Physician Assistant

## 2016-09-28 ENCOUNTER — Encounter: Payer: Self-pay | Admitting: Physician Assistant

## 2016-09-28 VITALS — BP 148/82 | HR 59 | Ht 68.0 in | Wt 185.2 lb

## 2016-09-28 DIAGNOSIS — L570 Actinic keratosis: Secondary | ICD-10-CM | POA: Diagnosis not present

## 2016-09-28 DIAGNOSIS — D225 Melanocytic nevi of trunk: Secondary | ICD-10-CM | POA: Diagnosis not present

## 2016-09-28 DIAGNOSIS — I481 Persistent atrial fibrillation: Secondary | ICD-10-CM | POA: Diagnosis not present

## 2016-09-28 DIAGNOSIS — I2510 Atherosclerotic heart disease of native coronary artery without angina pectoris: Secondary | ICD-10-CM | POA: Diagnosis not present

## 2016-09-28 DIAGNOSIS — L738 Other specified follicular disorders: Secondary | ICD-10-CM | POA: Diagnosis not present

## 2016-09-28 DIAGNOSIS — R400 Somnolence: Secondary | ICD-10-CM

## 2016-09-28 DIAGNOSIS — I10 Essential (primary) hypertension: Secondary | ICD-10-CM | POA: Diagnosis not present

## 2016-09-28 DIAGNOSIS — I4819 Other persistent atrial fibrillation: Secondary | ICD-10-CM

## 2016-09-28 DIAGNOSIS — L821 Other seborrheic keratosis: Secondary | ICD-10-CM | POA: Diagnosis not present

## 2016-09-28 DIAGNOSIS — Z5181 Encounter for therapeutic drug level monitoring: Secondary | ICD-10-CM

## 2016-09-28 DIAGNOSIS — E039 Hypothyroidism, unspecified: Secondary | ICD-10-CM | POA: Diagnosis not present

## 2016-09-28 DIAGNOSIS — L720 Epidermal cyst: Secondary | ICD-10-CM | POA: Diagnosis not present

## 2016-09-28 LAB — POCT INR: INR: 2.7

## 2016-09-28 LAB — TSH: TSH: 4.36 m[IU]/L (ref 0.40–4.50)

## 2016-09-28 NOTE — Patient Instructions (Signed)
Medication Instructions:  RECOMMEND TO DECREASE TRAZODONE IN HALF TO DECREASE DAYTIME SLEEPINESS  PLEASE CALL us FOR PHYSICAL THERAPY EVALUATION    If you need a refill on your cardiac medications before your next appointment, please call your pharmacy.  Labwork: TSH TODAY AT [facility] ON THE 1ST FLOOR   Follow-Up: Your physician wants you to follow-up in: [place]       Thank you for choosing [facility] at [facility]!!    RHONDA BARRETT, Revonda Humphrey, [place]

## 2016-09-28 NOTE — Progress Notes (Signed)
Cardiology Office Note   Date:  09/28/2016   ID:  [name], [name] [name] 11, 1928, MRN [id number]  PCP:  Kevin Cowden, MD  Cardiologist:  Dr. Percival [name] 06/22/2016 Kevin Palau, NP 09/18/2016 Kevin Ferries, PA-C 07/21/2015  Chief Complaint  Patient presents with  . Follow-up    59month;     History of Present Illness: [name] is a 83 y.o. male with a history of persistent atrial fib on amiodarone, s/p TEE/DCCV 09/30/2016 on warfarin, CHADS2VASC=4 (age x 2, HTN, CAD), BPH, Yosselin Zoeller's esophagus, OM stent 2002, RCA stent 2006, mod CAD at cath 2007 w/ med rx. EF nl w/ mod-severe MR at TEE 09/2016, HTN, HLD, GERD, hypothyroid.   [name] presents for post-hospital follow up  He has not recovered from this DCCV as well as from previous ones.   He has been weaker. He has been getting DOE more easily. His activity level is lower, he is not able to do as much with household chores. He gets tired with activity. He also gets sleepy during the day, , Has actually fallen asleep at the table during a meal. He is not noted to quit breathing at night   His MD at the [facility] is aware he is on amio, follows his thyroid closely. However, the last check was a couple of months ago.   No LE edema, no orthopnea, no PND.   He has taken the Clonazepam and Trazodone for a long time, no dose change.   He walks around the house some but his wife states that he is not doing this during much. He was offered a physical therapy evaluation but refused it, just doesn't feel like it. He seems listless and to have no energy at all.  He is not having any bleeding issues on Coumadin.   Past Medical History:  Diagnosis Date  . Anemia   . Venba Zenner's esophagus   . BENIGN PROSTATIC HYPERTROPHY   . Chronic anticoagulation    a. on Coumadin  . CORONARY ARTERY DISEASE    a. s/p stent OM 2002. b. s/p BMS 2006 RCA  c. s/p cath 2007.  [name] DEPRESSION   . DIVERTICULOSIS, COLON   . GERD   .  GYNECOMASTIA, UNILATERAL   . Hematoma of abdominal wall   . History of echocardiogram    a. EF 55% (TEE 06/2014)  . HYPERLIPIDEMIA   . HYPERTENSION   . HYPOTHYROIDISM   . Mallory - Weiss tear    a. > 5 years ago  . Mitral regurgitation    a. Mild-mod by TEE 06/2014.  [name] Paroxysmal a-fib (HCC)    a. chronic amiodarone and coumadin;  b. 05/2013 s/p DCCV. c. 06/2014 s/p TEE/DCCV.  [name] Popliteal artery embolism, right ([place])   . SLEEP APNEA    CPAP    Past Surgical History:  Procedure Laterality Date  . cardioversion  8/08  . CARDIOVERSION  09/14/2011   Procedure: CARDIOVERSION;  Surgeon: Josue Hector, MD;  Location: [facility] ENDOSCOPY;  Service: Cardiovascular;  Laterality: N/A;  . CARDIOVERSION N/A 03/23/2014   Procedure: CARDIOVERSION;  Surgeon: Thompson Grayer, MD;  Location: [place];  Service: Cardiovascular;  Laterality: N/A;  . CARDIOVERSION N/A 06/22/2014   Procedure: CARDIOVERSION;  Surgeon: [name], MD;  Location: [facility] ENDOSCOPY;  Service: Cardiovascular;  Laterality: N/A;  . CARDIOVERSION N/A 04/27/2015   Procedure: CARDIOVERSION;  Surgeon: Josue Hector, MD;  Location: [facility] ENDOSCOPY;  Service: Cardiovascular;  Laterality: N/A;  . CARDIOVERSION  N/A 07/16/2015   Procedure: CARDIOVERSION;  Surgeon: [name], MD;  Location: [place];  Service: Cardiovascular;  Laterality: N/A;  . CARDIOVERSION N/A 11/26/2015   Procedure: CARDIOVERSION;  Surgeon: Sanda Klein, MD;  Location: [facility] ENDOSCOPY;  Service: Cardiovascular;  Laterality: N/A;  . CARDIOVERSION N/A 05/17/2016   Procedure: CARDIOVERSION;  Surgeon: Thayer Headings, MD;  Location: [place];  Service: Cardiovascular;  Laterality: N/A;  . CARDIOVERSION N/A 09/20/2016   Procedure: CARDIOVERSION;  Surgeon: [name], MD;  Location: [place];  Service: Cardiovascular;  Laterality: N/A;  . CORONARY ANGIOPLASTY WITH STENT PLACEMENT    . ESOPHAGOGASTRODUODENOSCOPY    . TEE WITH CARDIOVERSION  7/08  . TEE WITHOUT  CARDIOVERSION  09/14/2011   Procedure: TRANSESOPHAGEAL ECHOCARDIOGRAM (TEE);  Surgeon: Josue Hector, MD;  Location: [facility] ENDOSCOPY;  Service: Cardiovascular;  Laterality: N/A;  . TEE WITHOUT CARDIOVERSION N/A 06/22/2014   Procedure: TRANSESOPHAGEAL ECHOCARDIOGRAM (TEE);  Surgeon: [name], MD;  Location: [facility] ENDOSCOPY;  Service: Cardiovascular;  Laterality: N/A;  . TEE WITHOUT CARDIOVERSION N/A 05/17/2016   Procedure: TRANSESOPHAGEAL ECHOCARDIOGRAM (TEE);  Surgeon: Thayer Headings, MD;  Location: [place];  Service: Cardiovascular;  Laterality: N/A;  . TEE WITHOUT CARDIOVERSION N/A 09/20/2016   Procedure: TRANSESOPHAGEAL ECHOCARDIOGRAM (TEE);  Surgeon: [name], MD;  Location: [facility] ENDOSCOPY;  Service: Cardiovascular;  Laterality: N/A;  . TRANSURETHRAL RESECTION OF PROSTATE      Medication Sig  . amiodarone (PACERONE) 200 MG tablet Take 1 tablet (200 mg total) by mouth daily. Starting on February 14 th  . amLODipine (NORVASC) 5 MG tablet Take 1 tablet (5 mg total) by mouth daily.  . clonazePAM (KLONOPIN) 1 MG tablet Take 1 tablet (1 mg total) by mouth at bedtime.  [name] DIGESTIVE ENZYMES PO Take 1 tablet by mouth 2 (two) times daily.   . ferrous sulfate 325 (65 FE) MG tablet Take 325 mg by mouth 2 (two) times daily with a meal.  . finasteride (PROSCAR) 5 MG tablet Take 5 mg by mouth daily.  . fish oil-omega-3 fatty acids 1000 MG capsule Take 1 g by mouth 2 (two) times daily.   . hydroxypropyl methylcellulose (ISOPTO TEARS) 2.5 % ophthalmic solution Place 1 drop into both eyes 3 (three) times daily as needed for dry eyes.   [name] levothyroxine (SYNTHROID, LEVOTHROID) 50 MCG tablet Take 50 mcg by mouth See admin instructions. On Tuesday thursdays and saturdays patient stats he takes a full tablet then take a half along with it on these days. 1 whole tablet rest of days  . Multiple Vitamin (MULTIVITAMIN) capsule Take 1 capsule by mouth daily.  . nitroGLYCERIN (NITROSTAT) 0.4 MG SL tablet  Place 0.4 mg under the tongue every 5 (five) minutes as needed for chest pain.   . pantoprazole (PROTONIX) 40 MG tablet Take 40 mg by mouth 2 (two) times daily.   . pravastatin (PRAVACHOL) 40 MG tablet Take 40 mg by mouth every evening.  . traMADol (ULTRAM) 50 MG tablet Take 1 tablet (50 mg total) by mouth every 8 (eight) hours as needed.  . traZODone (DESYREL) 50 MG tablet Take by mouth at bedtime as needed for sleep.   [name] warfarin (COUMADIN) 5 MG tablet Take 5mg  on Monday Wednesday and fridays. Then take half of the 5mg  rest of days  . warfarin (COUMADIN) 5 MG tablet Take 1/2 to 1 tablet daily or as directed by coumadin clinic   No current facility-administered medications for this visit.  Allergies:   Metoprolol tartrate    Social History:  The patient  reports that he has never smoked. He has never used smokeless tobacco. He reports that he does not drink alcohol or use drugs.   Family History:  The patient's family history includes Heart attack in his mother; Heart disease in his brother; Hypertension in his other.    ROS:  Please see the history of present illness. All other systems are reviewed and negative.    PHYSICAL EXAM: VS:  BP (!) 148/82   Pulse (!) 59   Ht 5\' 8"  (1.727 m)   Wt 185 lb 3.2 oz (84 kg)   BMI 28.16 kg/m  , BMI Body mass index is 28.16 kg/m. GEN: Well nourished, well developed, male in no acute distress  HEENT: normal for age  Neck: Minimal JVD, no carotid bruit, no masses Cardiac: RRR; soft murmur, no rubs, or gallops Respiratory:  clear to auscultation bilaterally, normal work of breathing GI: soft, nontender, nondistended, + BS MS: no deformity or atrophy; no edema; distal pulses are 2+ in all 4 extremities   Skin: warm and dry, no rash Neuro:  Strength and sensation are intact Psych: euthymic mood, full affect   EKG:  EKG is not ordered today.  TEE: 09/20/2016 - Left ventricle: Systolic function was normal. The estimated   ejection  fraction was in the range of 55% to 60%. No evidence of   thrombus. - Aortic valve: No evidence of vegetation. There was no stenosis.   There was mild regurgitation. - Mitral valve: There was moderate to severe regurgitation. - Left atrium: The atrium was dilated. No evidence of thrombus in   the atrial cavity or appendage. No evidence of thrombus in the   appendage. No evidence of thrombus in the atrial cavity or   appendage. - Right ventricle: Systolic function was normal. - Right atrium: The atrium was severely dilated. No evidence of   thrombus in the atrial cavity or appendage. - Atrial septum: No defect or patent foramen ovale was identified. - Tricuspid valve: No evidence of vegetation. - Pulmonic valve: No evidence of vegetation. - Pulmonary arteries: Systolic pressure was mildly increased. PA   peak pressure: 33 mm Hg (S). Impressions: - Successful cardioversion. No cardiac source of emboli was   indentified. There is systolic flow reversal in the right   pulmonary veins. Mitral regurgitation in moderate to severe.  CATH:  April 2007  LAD 40% tubular lesion in the ostium, mid section 50-60% stenosis  Diag 70% ostial stenosis  CFX 30% stenosis at the ostium and 40% stenosis of the first obtuse marginal  RCA is dominant, mRCA stent 30% Distal stent w/ 50-60% ISR  PDA had a 40% stenosis   Recent Labs: 09/18/2016: ALT 25; BUN 19; Creatinine, Ser 1.32; Hemoglobin 14.4; Platelets 181; Potassium 4.0; Sodium 139; TSH 4.430    Lipid Panel    Component Value Date/Time   CHOL 130 10/07/2008 0927   TRIG 32.0 10/07/2008 0927   HDL 61.10 10/07/2008 0927   CHOLHDL 2 10/07/2008 0927   VLDL 6.4 10/07/2008 0927   LDLCALC 63 10/07/2008 0927     Wt Readings from Last 3 Encounters:  09/28/16 185 lb 3.2 oz (84 kg)  09/20/16 184 lb (83.5 kg)  09/18/16 184 lb 12.8 oz (83.8 kg)     Other studies Reviewed: Additional studies/ records that were reviewed today include: Office notes,  hospital records and testing.  ASSESSMENT AND PLAN:  1.  PAF:  He has cut back on the amiodarone to 1 tablet daily as directed. His heart rate is in the 50s, but that is chronic for him. No med changes.  2. Chronic anticoagulation: Coumadin was checked today and was therapeutic at 2.7. Management per the Coumadin Clinic.  3. Somnolence and fatigue: He was not anemic at preprocedural labs for his cardioversion. He did well with the procedure and was not having any problems afterwards with significant hematuria or melena. His TSH is followed by the [place]. With the fluctuations in his amiodarone dose, I will check a TSH to make sure that it is stable.   Although he has been on the trazodone and Klonopin at bedtime for a long time, I am concerned that some of his daytime somnolence secondary to these medications. He is requested to cut the trazodone in half and see if he will tolerate this. If his daytime somnolence improves, but he is not sleeping as well, he should follow-up with his PCP for further management of insomnia.   He refuses physical therapy evaluation, but I feel that he is significantly weakened and he is encouraged to increase his activity. If he does not, I fear that his activity level will continue to decrease.  4. CAD: He is not having any ischemic symptoms. His blood pressure is a little elevated today, but I do not wish to cause him more fatigue so I will not change any medications for this. He is not on aspirin because of the Coumadin. He is on a statin. He is not on a beta blocker because of resting bradycardia. Continue current therapy.  5. Hypertension: In order to make sure that we do not confuse the issue regarding his fatigue and somnolence, I will not make any medication changes at this time. He is encouraged to follow this as an outpatient.   Current medicines are reviewed at length with the patient today.  The patient does not have concerns regarding medicines.  The  following changes have been made:  Decrease the trazodone  Labs/ tests ordered today include:   Orders Placed This Encounter  Procedures  . TSH     Disposition:   FU with Dr. Percival [name]  Signed, Lenoard Aden  09/28/2016 6:36 PM    [place] Phone: [phone]; Fax: [phone]  This note was written with the assistance of speech recognition software. Please excuse any transcriptional errors.

## 2016-10-01 ENCOUNTER — Encounter (HOSPITAL_COMMUNITY): Payer: Self-pay | Admitting: *Deleted

## 2016-10-01 ENCOUNTER — Emergency Department (HOSPITAL_COMMUNITY)
Admission: EM | Admit: 2016-10-01 | Discharge: 2016-10-01 | Disposition: A | Discharge status: Home / Self Care | Payer: Medicare Other | Attending: Emergency Medicine | Admitting: Emergency Medicine

## 2016-10-01 DIAGNOSIS — Z79899 Other long term (current) drug therapy: Secondary | ICD-10-CM | POA: Insufficient documentation

## 2016-10-01 DIAGNOSIS — I2510 Atherosclerotic heart disease of native coronary artery without angina pectoris: Secondary | ICD-10-CM | POA: Diagnosis not present

## 2016-10-01 DIAGNOSIS — T148XXA Other injury of unspecified body region, initial encounter: Secondary | ICD-10-CM

## 2016-10-01 DIAGNOSIS — I10 Essential (primary) hypertension: Secondary | ICD-10-CM | POA: Diagnosis not present

## 2016-10-01 DIAGNOSIS — Y939 Activity, unspecified: Secondary | ICD-10-CM | POA: Insufficient documentation

## 2016-10-01 DIAGNOSIS — Y929 Unspecified place or not applicable: Secondary | ICD-10-CM | POA: Diagnosis not present

## 2016-10-01 DIAGNOSIS — S50311A Abrasion of right elbow, initial encounter: Secondary | ICD-10-CM | POA: Insufficient documentation

## 2016-10-01 DIAGNOSIS — Z7901 Long term (current) use of anticoagulants: Secondary | ICD-10-CM | POA: Diagnosis not present

## 2016-10-01 DIAGNOSIS — S59901A Unspecified injury of right elbow, initial encounter: Secondary | ICD-10-CM | POA: Diagnosis present

## 2016-10-01 DIAGNOSIS — E039 Hypothyroidism, unspecified: Secondary | ICD-10-CM | POA: Insufficient documentation

## 2016-10-01 DIAGNOSIS — W228XXA Striking against or struck by other objects, initial encounter: Secondary | ICD-10-CM | POA: Insufficient documentation

## 2016-10-01 DIAGNOSIS — Y999 Unspecified external cause status: Secondary | ICD-10-CM | POA: Insufficient documentation

## 2016-10-01 NOTE — Discharge Instructions (Signed)
Keep area clean and dry.  Follow up with your primary care provider as needed.  Return to ER for new or worsening symptoms, any additional concerns.

## 2016-10-01 NOTE — ED Provider Notes (Signed)
[place] DEPT Provider Note   CSN: [id number] Arrival date & time: 10/01/16  1222  By signing my name below, I, Reola Mosher, attest that this documentation has been prepared under the direction and in the presence of [facility], PA-C.  Electronically Signed: Reola Mosher, ED Scribe. 10/01/16. 2:09 PM.  History   Chief Complaint Chief Complaint  Patient presents with  . Wound Check   The history is provided by the patient. No language interpreter was used.    HPI Comments: [name] is a 83 y.o. male with a h/o AFIB on chronic anticoagulation who presents to the Emergency Department complaining of a wound sustained to the right elbow which occurred just prior to arrival. Per pt, he was sitting in his recliner today when he struck his right elbow on a portion of the recliner and sustained a small wound to the area. Per pt, bleeding was initially uncontrolled but this resolved just prior to him checking into the ED. Bleeding is controlled. Pt is currently chronically anticoagulated on Coumadin therapy daily. There are no other associated symptoms or complaints at this time. There are no other modifying factors. He denies numbness, weakness, or any other associated symptoms. Tetanus is UTD.   Past Medical History:  Diagnosis Date  . Anemia   . Barrett's esophagus   . BENIGN PROSTATIC HYPERTROPHY   . Chronic anticoagulation    a. on Coumadin  . CORONARY ARTERY DISEASE    a. s/p stent OM 2002. b. s/p BMS 2006 RCA  c. s/p cath 2007.  [name] DEPRESSION   . DIVERTICULOSIS, COLON   . GERD   . GYNECOMASTIA, UNILATERAL   . Hematoma of abdominal wall   . History of echocardiogram    a. EF 55% (TEE 06/2014)  . HYPERLIPIDEMIA   . HYPERTENSION   . HYPOTHYROIDISM   . Mallory - Weiss tear    a. > 5 years ago  . Mitral regurgitation    a. Mild-mod by TEE 06/2014.  [name] Paroxysmal a-fib (HCC)    a. chronic amiodarone and coumadin;  b. 05/2013 s/p DCCV. c. 06/2014 s/p  TEE/DCCV.  [name] Popliteal artery embolism, right ([place])   . SLEEP APNEA    CPAP   Patient Active Problem List   Diagnosis Date Noted  . Atrial fibrillation with RVR ([place])   . Persistent atrial fibrillation ([place]) 11/26/2015  . Anemia   . Fatigue 06/26/2014  . Chronic anticoagulation   . Iron deficiency anemia 01/21/2014  . Encounter for therapeutic drug monitoring 09/10/2013  . Paroxysmal a-fib ([place]), Rapid Ventricular Response   . Insomnia 11/25/2012  . Parasomnia 11/04/2012  . Long term (current) use of anticoagulants - Coumadin 09/01/2011  . GYNECOMASTIA, UNILATERAL 03/23/2010  . WEAKNESS 11/09/2009  . Backache 09/16/2009  . HLD (hyperlipidemia) 10/20/2008  . Mitral regurgitation 10/20/2008  . DEPRESSION 12/17/2007  . Hypothyroidism 05/20/2007  . OSA (obstructive sleep apnea) 05/20/2007  . Essential hypertension 04/10/2007  . CAD (coronary artery disease) 04/10/2007  . GERD 04/10/2007  . DIVERTICULOSIS, COLON 04/10/2007  . BENIGN PROSTATIC HYPERTROPHY 04/10/2007   Past Surgical History:  Procedure Laterality Date  . cardioversion  8/08  . CARDIOVERSION  09/14/2011   Procedure: CARDIOVERSION;  Surgeon: Josue Hector, MD;  Location: [facility] ENDOSCOPY;  Service: Cardiovascular;  Laterality: N/A;  . CARDIOVERSION N/A 03/23/2014   Procedure: CARDIOVERSION;  Surgeon: Thompson Grayer, MD;  Location: [place];  Service: Cardiovascular;  Laterality: N/A;  . CARDIOVERSION N/A 06/22/2014   Procedure:  CARDIOVERSION;  Surgeon: [name], MD;  Location: [facility] ENDOSCOPY;  Service: Cardiovascular;  Laterality: N/A;  . CARDIOVERSION N/A 04/27/2015   Procedure: CARDIOVERSION;  Surgeon: Josue Hector, MD;  Location: [facility] ENDOSCOPY;  Service: Cardiovascular;  Laterality: N/A;  . CARDIOVERSION N/A 07/16/2015   Procedure: CARDIOVERSION;  Surgeon: [name], MD;  Location: [place];  Service: Cardiovascular;  Laterality: N/A;  . CARDIOVERSION N/A 11/26/2015   Procedure: CARDIOVERSION;  Surgeon:  Sanda Klein, MD;  Location: [facility] ENDOSCOPY;  Service: Cardiovascular;  Laterality: N/A;  . CARDIOVERSION N/A 05/17/2016   Procedure: CARDIOVERSION;  Surgeon: Thayer Headings, MD;  Location: [place];  Service: Cardiovascular;  Laterality: N/A;  . CARDIOVERSION N/A 09/20/2016   Procedure: CARDIOVERSION;  Surgeon: [name], MD;  Location: [employer];  Service: Cardiovascular;  Laterality: N/A;  . CORONARY ANGIOPLASTY WITH STENT PLACEMENT    . ESOPHAGOGASTRODUODENOSCOPY    . TEE WITH CARDIOVERSION  7/08  . TEE WITHOUT CARDIOVERSION  09/14/2011   Procedure: TRANSESOPHAGEAL ECHOCARDIOGRAM (TEE);  Surgeon: Josue Hector, MD;  Location: [facility] ENDOSCOPY;  Service: Cardiovascular;  Laterality: N/A;  . TEE WITHOUT CARDIOVERSION N/A 06/22/2014   Procedure: TRANSESOPHAGEAL ECHOCARDIOGRAM (TEE);  Surgeon: [name], MD;  Location: [facility] ENDOSCOPY;  Service: Cardiovascular;  Laterality: N/A;  . TEE WITHOUT CARDIOVERSION N/A 05/17/2016   Procedure: TRANSESOPHAGEAL ECHOCARDIOGRAM (TEE);  Surgeon: Thayer Headings, MD;  Location: [place];  Service: Cardiovascular;  Laterality: N/A;  . TEE WITHOUT CARDIOVERSION N/A 09/20/2016   Procedure: TRANSESOPHAGEAL ECHOCARDIOGRAM (TEE);  Surgeon: [name], MD;  Location: [place];  Service: Cardiovascular;  Laterality: N/A;  . TRANSURETHRAL RESECTION OF PROSTATE      Home Medications    Prior to Admission medications   Medication Sig Start Date End Date Taking? Authorizing Provider  amiodarone (PACERONE) 200 MG tablet Take 1 tablet (200 mg total) by mouth daily. Starting on February 14 th 12/02/15   Sherran Needs, NP  amLODipine (NORVASC) 5 MG tablet Take 1 tablet (5 mg total) by mouth daily. 07/29/15   Minus Breeding, MD  clonazePAM (KLONOPIN) 1 MG tablet Take 1 tablet (1 mg total) by mouth at bedtime. 01/08/15   Marletta Lor, MD  DIGESTIVE ENZYMES PO Take 1 tablet by mouth 2 (two) times daily.     Historical Provider, MD  ferrous  sulfate 325 (65 FE) MG tablet Take 325 mg by mouth 2 (two) times daily with a meal.    Historical Provider, MD  finasteride (PROSCAR) 5 MG tablet Take 5 mg by mouth daily. 12/22/14   Historical Provider, MD  fish oil-omega-3 fatty acids 1000 MG capsule Take 1 g by mouth 2 (two) times daily.     Historical Provider, MD  hydroxypropyl methylcellulose (ISOPTO TEARS) 2.5 % ophthalmic solution Place 1 drop into both eyes 3 (three) times daily as needed for dry eyes.     Historical Provider, MD  levothyroxine (SYNTHROID, LEVOTHROID) 50 MCG tablet Take 50 mcg by mouth See admin instructions. On Tuesday thursdays and saturdays patient stats he takes a full tablet then take a half along with it on these days. 1 whole tablet rest of days    Historical Provider, MD  Multiple Vitamin (MULTIVITAMIN) capsule Take 1 capsule by mouth daily.    Historical Provider, MD  nitroGLYCERIN (NITROSTAT) 0.4 MG SL tablet Place 0.4 mg under the tongue every 5 (five) minutes as needed for chest pain.     Historical Provider, MD  pantoprazole (PROTONIX) 40  MG tablet Take 40 mg by mouth 2 (two) times daily.     Historical Provider, MD  pravastatin (PRAVACHOL) 40 MG tablet Take 40 mg by mouth every evening. 03/07/11   Imogene Burn, PA-C  traMADol (ULTRAM) 50 MG tablet Take 1 tablet (50 mg total) by mouth every 8 (eight) hours as needed. 08/01/16   Briscoe Deutscher, DO  traZODone (DESYREL) 50 MG tablet Take by mouth at bedtime as needed for sleep.     Historical Provider, MD  warfarin (COUMADIN) 5 MG tablet Take 5mg  on Monday Wednesday and fridays. Then take half of the 5mg  rest of days    Historical Provider, MD   Family History Family History  Problem Relation Age of Onset  . Heart attack Mother   . Hypertension Other   . Heart disease Brother     CAD male 1st degree relative  . Stroke Neg Hx    Social History Social History  Substance Use Topics  . Smoking status: Never Smoker  . Smokeless tobacco: Never Used  . Alcohol  use No   Allergies   Metoprolol tartrate  Review of Systems Review of Systems  Skin: Positive for wound.  Neurological: Negative for weakness and numbness.   Physical Exam Updated Vital Signs BP (!) 141/73 (BP Location: Left Arm)   Pulse (!) 59   Temp 97.9 F (36.6 C)   Resp 16   Ht 5\' 8"  (1.727 m)   Wt 185 lb (83.9 kg)   SpO2 97%   BMI 28.13 kg/m   Physical Exam  Constitutional: He appears well-developed and well-nourished. No distress.  HENT:  Head: Normocephalic and atraumatic.  Eyes: Conjunctivae are normal.  Neck: Normal range of motion.  Cardiovascular: Normal rate.   Pulmonary/Chest: Effort normal.  Abdominal: He exhibits no distension.  Musculoskeletal: Normal range of motion.  1cm skin abrasion to the right elbow. Full ROM of the right elbow w/o pain. No TTP.   Neurological: He is alert.  Skin: No pallor.  Psychiatric: He has a normal mood and affect. His behavior is normal.  Nursing note and vitals reviewed.  ED Treatments / Results  DIAGNOSTIC STUDIES: Oxygen Saturation is 97% on RA, normal by my interpretation.   COORDINATION OF CARE: 1:58 PM-Discussed next steps with pt. Pt verbalized understanding and is agreeable with the plan.   Procedures Procedures   Medications Ordered in ED Medications - No data to display  Initial Impression / Assessment and Plan / ED Course  I have reviewed the triage vital signs and the nursing notes.     ARTHURO CANELO is a 83 y.o. male who presents to ED for skin abrasion to the right elbow which was thoroughly cleaned and irrigated in the ED today. No bony tenderness. Full range of motion. No need for x-ray of the elbow at this time. Bleeding now controlled and skin abrasion repaired with Steri-Strips. Follow up with PCP as needed.  Patient seen by and discussed with Dr. Lacinda Axon who agrees with treatment plan.    Final Clinical Impressions(s) / ED Diagnoses   Final diagnoses:  Skin abrasion   New  Prescriptions New Prescriptions   No medications on file   I personally performed the services described in this documentation, which was scribed in my presence. The recorded information has been reviewed and is accurate.     [facility], PA-C 10/01/16 [place], MD 10/02/16 [phone]

## 2016-10-26 ENCOUNTER — Ambulatory Visit (INDEPENDENT_AMBULATORY_CARE_PROVIDER_SITE_OTHER): Payer: Medicare Other | Admitting: Pharmacist Clinician (PhC)/ Clinical Pharmacy Specialist

## 2016-10-26 DIAGNOSIS — Z5181 Encounter for therapeutic drug level monitoring: Secondary | ICD-10-CM | POA: Diagnosis not present

## 2016-10-26 DIAGNOSIS — I2510 Atherosclerotic heart disease of native coronary artery without angina pectoris: Secondary | ICD-10-CM

## 2016-10-26 LAB — POCT INR: INR: 2.2

## 2016-11-01 ENCOUNTER — Telehealth: Payer: Self-pay | Admitting: Cardiology

## 2016-11-01 NOTE — Telephone Encounter (Signed)
New Message  Patient c/o Palpitations:  High priority if patient c/o lightheadedness and shortness of breath.  1. How long have you been having palpitations? This morninging  2. Are you currently experiencing lightheadedness and shortness of breath? Yes  3. Have you checked your BP and heart rate? (document readings) 100-127 hr  4. Are you experiencing any other symptoms? Very Weak

## 2016-11-01 NOTE — Telephone Encounter (Signed)
Spoke with patients wife and patient is back in Afib with heart rate 100-127 per blood pressure machine Patient is feeling weak and having some shortness of breath Discussed with Janan Ridge PA and will have patient increase Amiodarone to BID and see Afib clinic tomorrow at 11:00 Advised wife of recommendations and to go to ED if worse, verbalized understanding

## 2016-11-02 ENCOUNTER — Encounter (HOSPITAL_COMMUNITY): Payer: Self-pay | Admitting: Nurse Practitioner

## 2016-11-02 ENCOUNTER — Ambulatory Visit (HOSPITAL_COMMUNITY)
Admission: RE | Admit: 2016-11-02 | Discharge: 2016-11-02 | Disposition: A | Discharge status: Home / Self Care | Payer: Medicare Other | Source: Ambulatory Visit | Attending: Nurse Practitioner | Admitting: Nurse Practitioner

## 2016-11-02 VITALS — BP 122/84 | HR 110 | Ht 68.0 in | Wt 185.0 lb

## 2016-11-02 DIAGNOSIS — I480 Paroxysmal atrial fibrillation: Secondary | ICD-10-CM | POA: Insufficient documentation

## 2016-11-02 DIAGNOSIS — I2510 Atherosclerotic heart disease of native coronary artery without angina pectoris: Secondary | ICD-10-CM | POA: Insufficient documentation

## 2016-11-02 DIAGNOSIS — I481 Persistent atrial fibrillation: Secondary | ICD-10-CM | POA: Diagnosis not present

## 2016-11-02 DIAGNOSIS — E039 Hypothyroidism, unspecified: Secondary | ICD-10-CM | POA: Diagnosis not present

## 2016-11-02 DIAGNOSIS — I4819 Other persistent atrial fibrillation: Secondary | ICD-10-CM

## 2016-11-02 DIAGNOSIS — D649 Anemia, unspecified: Secondary | ICD-10-CM | POA: Diagnosis not present

## 2016-11-02 DIAGNOSIS — G4730 Sleep apnea, unspecified: Secondary | ICD-10-CM | POA: Insufficient documentation

## 2016-11-02 DIAGNOSIS — Z8249 Family history of ischemic heart disease and other diseases of the circulatory system: Secondary | ICD-10-CM | POA: Insufficient documentation

## 2016-11-02 DIAGNOSIS — I743 Embolism and thrombosis of arteries of the lower extremities: Secondary | ICD-10-CM | POA: Diagnosis not present

## 2016-11-02 DIAGNOSIS — I10 Essential (primary) hypertension: Secondary | ICD-10-CM | POA: Diagnosis not present

## 2016-11-02 DIAGNOSIS — K2270 Barrett's esophagus without dysplasia: Secondary | ICD-10-CM | POA: Insufficient documentation

## 2016-11-02 DIAGNOSIS — E785 Hyperlipidemia, unspecified: Secondary | ICD-10-CM | POA: Insufficient documentation

## 2016-11-02 DIAGNOSIS — F329 Major depressive disorder, single episode, unspecified: Secondary | ICD-10-CM | POA: Diagnosis not present

## 2016-11-02 DIAGNOSIS — Z7901 Long term (current) use of anticoagulants: Secondary | ICD-10-CM | POA: Insufficient documentation

## 2016-11-02 DIAGNOSIS — I4510 Unspecified right bundle-branch block: Secondary | ICD-10-CM | POA: Insufficient documentation

## 2016-11-02 DIAGNOSIS — K219 Gastro-esophageal reflux disease without esophagitis: Secondary | ICD-10-CM | POA: Insufficient documentation

## 2016-11-02 DIAGNOSIS — I340 Nonrheumatic mitral (valve) insufficiency: Secondary | ICD-10-CM | POA: Insufficient documentation

## 2016-11-02 DIAGNOSIS — N400 Enlarged prostate without lower urinary tract symptoms: Secondary | ICD-10-CM | POA: Insufficient documentation

## 2016-11-02 NOTE — Progress Notes (Signed)
Patient ID: GURKARAN RAHM, male   DOB: 1927-03-27, 83 y.o.   MRN: [id number]     Primary Care Physician: Nyoka Cowden, MD Referring Physician: Dr. Kae Heller BALTHAZAR DOOLY is a 83 y.o. male with a h/o persistent afib, on amiodarone, that is in afib clinic today for  onset of afib since yesterday am. He is on amiodarone but now has had 7 cardioversion's since September of 2015 with the last one in March . He is weak with ambulation but more so with afib. He is not on rate control due to drops in HR and BP, on BB's c/o low energy, anxiety and fog. He is on warfarin and last INR was therapeutic 4/12 at 2.2.  He did increase amioderone to 200 mg bid yesterday to help with rate control.   Today, he denies symptoms of  chest pain, orthopnea, PND, lower extremity edema, dizziness, presyncope, syncope, or neurologic sequela. Positive for weakness, shortness of breath with exertion when in afib..The patient is tolerating medications without difficulties and is otherwise without complaint today.   Past Medical History:  Diagnosis Date  . Anemia   . Barrett's esophagus   . BENIGN PROSTATIC HYPERTROPHY   . Chronic anticoagulation    a. on Coumadin  . CORONARY ARTERY DISEASE    a. s/p stent OM 2002. b. s/p BMS 2006 RCA  c. s/p cath 2007.  [name] DEPRESSION   . DIVERTICULOSIS, COLON   . GERD   . GYNECOMASTIA, UNILATERAL   . Hematoma of abdominal wall   . History of echocardiogram    a. EF 55% (TEE 06/2014)  . HYPERLIPIDEMIA   . HYPERTENSION   . HYPOTHYROIDISM   . Mallory - Weiss tear    a. > 5 years ago  . Mitral regurgitation    a. Mild-mod by TEE 06/2014.  [name] Paroxysmal A-fib (HCC)    a. chronic amiodarone and coumadin;  b. 05/2013 s/p DCCV. c. 06/2014 s/p TEE/DCCV.  [name] Popliteal artery embolism, right ([place])   . SLEEP APNEA    CPAP   Past Surgical History:  Procedure Laterality Date  . cardioversion  8/08  . CARDIOVERSION  09/14/2011   Procedure: CARDIOVERSION;  Surgeon: Josue Hector, MD;  Location: [place];  Service: Cardiovascular;  Laterality: N/A;  . CARDIOVERSION N/A 03/23/2014   Procedure: CARDIOVERSION;  Surgeon: Thompson Grayer, MD;  Location: [place];  Service: Cardiovascular;  Laterality: N/A;  . CARDIOVERSION N/A 06/22/2014   Procedure: CARDIOVERSION;  Surgeon: [name], MD;  Location: [facility] ENDOSCOPY;  Service: Cardiovascular;  Laterality: N/A;  . CARDIOVERSION N/A 04/27/2015   Procedure: CARDIOVERSION;  Surgeon: Josue Hector, MD;  Location: [facility] ENDOSCOPY;  Service: Cardiovascular;  Laterality: N/A;  . CARDIOVERSION N/A 07/16/2015   Procedure: CARDIOVERSION;  Surgeon: [name], MD;  Location: [place];  Service: Cardiovascular;  Laterality: N/A;  . CARDIOVERSION N/A 11/26/2015   Procedure: CARDIOVERSION;  Surgeon: Sanda Klein, MD;  Location: [facility] ENDOSCOPY;  Service: Cardiovascular;  Laterality: N/A;  . CARDIOVERSION N/A 05/17/2016   Procedure: CARDIOVERSION;  Surgeon: Thayer Headings, MD;  Location: [place];  Service: Cardiovascular;  Laterality: N/A;  . CARDIOVERSION N/A 09/20/2016   Procedure: CARDIOVERSION;  Surgeon: [name], MD;  Location: [place];  Service: Cardiovascular;  Laterality: N/A;  . CORONARY ANGIOPLASTY WITH STENT PLACEMENT    . ESOPHAGOGASTRODUODENOSCOPY    . TEE WITH CARDIOVERSION  7/08  . TEE WITHOUT CARDIOVERSION  09/14/2011   Procedure: TRANSESOPHAGEAL ECHOCARDIOGRAM (  TEE);  Surgeon: Josue Hector, MD;  Location: [facility] ENDOSCOPY;  Service: Cardiovascular;  Laterality: N/A;  . TEE WITHOUT CARDIOVERSION N/A 06/22/2014   Procedure: TRANSESOPHAGEAL ECHOCARDIOGRAM (TEE);  Surgeon: [name], MD;  Location: [facility] ENDOSCOPY;  Service: Cardiovascular;  Laterality: N/A;  . TEE WITHOUT CARDIOVERSION N/A 05/17/2016   Procedure: TRANSESOPHAGEAL ECHOCARDIOGRAM (TEE);  Surgeon: Thayer Headings, MD;  Location: [place];  Service: Cardiovascular;  Laterality: N/A;  . TEE WITHOUT CARDIOVERSION N/A 09/20/2016    Procedure: TRANSESOPHAGEAL ECHOCARDIOGRAM (TEE);  Surgeon: [name], MD;  Location: [place];  Service: Cardiovascular;  Laterality: N/A;  . TRANSURETHRAL RESECTION OF PROSTATE      Current Outpatient Prescriptions  Medication Sig Dispense Refill  . amiodarone (PACERONE) 200 MG tablet Take 1 tablet (200 mg total) by mouth daily. Starting on February 14 th 60 tablet 11  . amLODipine (NORVASC) 5 MG tablet Take 1 tablet (5 mg total) by mouth daily. 30 tablet 3  . clonazePAM (KLONOPIN) 1 MG tablet Take 1 tablet (1 mg total) by mouth at bedtime. 30 tablet 5  . DIGESTIVE ENZYMES PO Take 1 tablet by mouth 2 (two) times daily.     . ferrous sulfate 325 (65 FE) MG tablet Take 325 mg by mouth 2 (two) times daily with a meal.    . finasteride (PROSCAR) 5 MG tablet Take 5 mg by mouth daily.  11  . fish oil-omega-3 fatty acids 1000 MG capsule Take 1 g by mouth 2 (two) times daily.     . hydroxypropyl methylcellulose (ISOPTO TEARS) 2.5 % ophthalmic solution Place 1 drop into both eyes 3 (three) times daily as needed for dry eyes.     [name] levothyroxine (SYNTHROID, LEVOTHROID) 50 MCG tablet Take 50 mcg by mouth See admin instructions. On Tuesday thursdays and saturdays patient stats he takes a full tablet then take a half along with it on these days. 1 whole tablet rest of days    . Multiple Vitamin (MULTIVITAMIN) capsule Take 1 capsule by mouth daily.    . nitroGLYCERIN (NITROSTAT) 0.4 MG SL tablet Place 0.4 mg under the tongue every 5 (five) minutes as needed for chest pain.     . pantoprazole (PROTONIX) 40 MG tablet Take 40 mg by mouth 2 (two) times daily.     . pravastatin (PRAVACHOL) 40 MG tablet Take 40 mg by mouth every evening.    . traMADol (ULTRAM) 50 MG tablet Take 1 tablet (50 mg total) by mouth every 8 (eight) hours as needed. 30 tablet 0  . traZODone (DESYREL) 50 MG tablet Take by mouth at bedtime as needed for sleep.     [name] warfarin (COUMADIN) 5 MG tablet Take 5mg  on Monday Wednesday  and fridays. Then take half of the 5mg  rest of days     No current facility-administered medications for this encounter.     Allergies  Allergen Reactions  . Metoprolol Tartrate Other (See Comments)    Anxiety, heart rate goes too low, feels like in a fog.    Social History   Social History  . Marital status: Married    Spouse name: N/A  . Number of children: 1  . Years of education: N/A   Occupational History  . Retired    Social History Main Topics  . Smoking status: Never Smoker  . Smokeless tobacco: Never Used  . Alcohol use No  . Drug use: No  . Sexual activity: Not on file   Other Topics Concern  .  Not on file   Social History Narrative   He is retired from Landscape [employer].   Mother lived into her 46's.  Father died in his 70's.  No CAD.    Family History  Problem Relation Age of Onset  . Heart attack Mother   . Hypertension Other   . Heart disease Brother     CAD male 1st degree relative  . Stroke Neg Hx     ROS- All systems are reviewed and negative except as per the HPI above  Physical Exam: Vitals:   11/02/16 1059  BP: 122/84  Pulse: (!) 110  Weight: 185 lb (83.9 kg)  Height: 5\' 8"  (1.727 m)    GEN- The patient is weak appearing, alert and oriented x 3 today.   Head- normocephalic, atraumatic Eyes-  Sclera clear, conjunctiva pink Ears- hearing intact Oropharynx- clear Neck- supple, no JVP Lymph- no cervical lymphadenopathy Lungs- Clear to ausculation bilaterally, normal work of breathing Heart-  irregular rate and rhythm, no murmurs, rubs or gallops, PMI not laterally displaced GI- soft, NT, ND, + BS Extremities- no clubbing, cyanosis, or edema MS- no significant deformity or atrophy Skin- no rash or lesion Psych- euthymic mood, full affect Neuro- strength and sensation are intact  EKG-afib at 110  bpm, qrs int 104 ms, qtc 435 ms Epic records reviewed  Assessment and Plan: 1.Symptomatic Paroxysmal afib Pt appears to be failing  amiodarone with more frequent breakthrough afib episodes and more frequent cardioversion's, last one in March 2018 He states that he gets so weak in afib that he cannot function I plan to send him to Dr. Curt Bears to discuss any other options other than frequent  cardioversions Appointment made for Monday 4/23, at 3:30 pm Continue on amiodarone 200 mg bid until he sees Dr. Curt Bears  Afib clinic as needed To ER if condition significantly worsens   Butch Penny C. Laparis Durrett, [facility] [address] [place], [place] [address] [phone]

## 2016-11-06 ENCOUNTER — Ambulatory Visit (INDEPENDENT_AMBULATORY_CARE_PROVIDER_SITE_OTHER): Payer: Medicare Other | Admitting: Cardiology

## 2016-11-06 ENCOUNTER — Encounter: Payer: Self-pay | Admitting: *Deleted

## 2016-11-06 ENCOUNTER — Other Ambulatory Visit: Payer: Self-pay | Admitting: Cardiology

## 2016-11-06 ENCOUNTER — Encounter: Payer: Self-pay | Admitting: Cardiology

## 2016-11-06 VITALS — BP 134/90 | HR 96 | Ht 72.0 in | Wt 186.4 lb

## 2016-11-06 DIAGNOSIS — Z01812 Encounter for preprocedural laboratory examination: Secondary | ICD-10-CM | POA: Diagnosis not present

## 2016-11-06 DIAGNOSIS — I4819 Other persistent atrial fibrillation: Secondary | ICD-10-CM

## 2016-11-06 DIAGNOSIS — I481 Persistent atrial fibrillation: Secondary | ICD-10-CM

## 2016-11-06 NOTE — Patient Instructions (Signed)
Medication Instructions:    Your physician recommends that you continue on your current medications as directed. Please refer to the Current Medication list given to you today.  Labwork:  Pre procedure labs today: BMET & CBC w/ diff  Testing/Procedures: Your physician has recommended that you have a Cardioversion (DCCV). Electrical Cardioversion uses a jolt of electricity to your heart either through paddles or wired patches attached to your chest. This is a controlled, usually prescheduled, procedure. Defibrillation is done under light anesthesia in the hospital, and you usually go home the day of the procedure. This is done to get your heart back into a normal rhythm. You are not awake for the procedure. Please see the instruction sheet given to you today.  Follow-Up:  Your physician recommends that you schedule a follow-up appointment in: 2 weeks with Roderic Palau in the AFib clinic   Your physician recommends that you schedule a follow-up appointment in: 3 months with Dr. Curt Bears.  - If you need a refill on your cardiac medications before your next appointment, please call your pharmacy.   Thank you for choosing [facility]!!   Trinidad Curet, RN [phone]

## 2016-11-06 NOTE — Addendum Note (Signed)
Encounter addended by: Sherran Needs, NP on: 11/06/2016  4:21 PM<BR>    Actions taken: LOS modified

## 2016-11-06 NOTE — Progress Notes (Signed)
Electrophysiology Office Note   Date:  11/06/2016   ID:  [name] May 12, 1927, MRN [id number]  PCP:  Nyoka Cowden, MD  Cardiologist:  [place] Primary Electrophysiologist:  Will Meredith Leeds, MD    Chief Complaint  Patient presents with  . Advice Only    Persistent Afib/discuss ablation     History of Present Illness: [name] is a 83 y.o. male who is being seen today for the evaluation of atrial fibrillation at the request of Marletta Lor, MD. Presenting today for electrophysiology evaluation. History of persistent atrial fibrillation on amiodarone. He has had 7 cardioversion since September 2015, his last one in March 2018. He is in general weak with ambulation of more so with atrial fibrillation. He is not on rate control medication 2 drops in heart rate and blood pressure. When on beta blocker she complains of low energy, anxiety, and a foggy feeling. Amiodarone was recently increased to 200 mg twice a day for rate control.His main symptom today is of fatigue. He says that he has trouble getting out of the chair and moving around his house. He says that he has tried one other antiarrhythmic for his atrial fibrillation, but he cannot November the name. He does not remember needing to be admitted to the hospital for that other medication.    Today, he denies symptoms of palpitations, chest pain, shortness of breath, orthopnea, PND, lower extremity edema, claudication, dizziness, presyncope, syncope, bleeding, or neurologic sequela. The patient is tolerating medications without difficulties.    Past Medical History:  Diagnosis Date  . Anemia   . Barrett's esophagus   . BENIGN PROSTATIC HYPERTROPHY   . Chronic anticoagulation    a. on Coumadin  . CORONARY ARTERY DISEASE    a. s/p stent OM 2002. b. s/p BMS 2006 RCA  c. s/p cath 2007.  [name] DEPRESSION   . DIVERTICULOSIS, COLON   . GERD   . GYNECOMASTIA, UNILATERAL   . Hematoma of abdominal wall     . History of echocardiogram    a. EF 55% (TEE 06/2014)  . HYPERLIPIDEMIA   . HYPERTENSION   . HYPOTHYROIDISM   . Mallory - Weiss tear    a. > 5 years ago  . Mitral regurgitation    a. Mild-mod by TEE 06/2014.  [name] Paroxysmal A-fib (HCC)    a. chronic amiodarone and coumadin;  b. 05/2013 s/p DCCV. c. 06/2014 s/p TEE/DCCV.  [name] Popliteal artery embolism, right ([place])   . SLEEP APNEA    CPAP   Past Surgical History:  Procedure Laterality Date  . cardioversion  8/08  . CARDIOVERSION  09/14/2011   Procedure: CARDIOVERSION;  Surgeon: Josue Hector, MD;  Location: [place];  Service: Cardiovascular;  Laterality: N/A;  . CARDIOVERSION N/A 03/23/2014   Procedure: CARDIOVERSION;  Surgeon: Thompson Grayer, MD;  Location: [place];  Service: Cardiovascular;  Laterality: N/A;  . CARDIOVERSION N/A 06/22/2014   Procedure: CARDIOVERSION;  Surgeon: [name], MD;  Location: [facility] ENDOSCOPY;  Service: Cardiovascular;  Laterality: N/A;  . CARDIOVERSION N/A 04/27/2015   Procedure: CARDIOVERSION;  Surgeon: Josue Hector, MD;  Location: [facility] ENDOSCOPY;  Service: Cardiovascular;  Laterality: N/A;  . CARDIOVERSION N/A 07/16/2015   Procedure: CARDIOVERSION;  Surgeon: [name], MD;  Location: [place];  Service: Cardiovascular;  Laterality: N/A;  . CARDIOVERSION N/A 11/26/2015   Procedure: CARDIOVERSION;  Surgeon: Sanda Klein, MD;  Location: [facility] ENDOSCOPY;  Service: Cardiovascular;  Laterality: N/A;  . CARDIOVERSION  N/A 05/17/2016   Procedure: CARDIOVERSION;  Surgeon: Thayer Headings, MD;  Location: [place];  Service: Cardiovascular;  Laterality: N/A;  . CARDIOVERSION N/A 09/20/2016   Procedure: CARDIOVERSION;  Surgeon: [name], MD;  Location: [place];  Service: Cardiovascular;  Laterality: N/A;  . CORONARY ANGIOPLASTY WITH STENT PLACEMENT    . ESOPHAGOGASTRODUODENOSCOPY    . TEE WITH CARDIOVERSION  7/08  . TEE WITHOUT CARDIOVERSION  09/14/2011   Procedure: TRANSESOPHAGEAL  ECHOCARDIOGRAM (TEE);  Surgeon: Josue Hector, MD;  Location: [facility] ENDOSCOPY;  Service: Cardiovascular;  Laterality: N/A;  . TEE WITHOUT CARDIOVERSION N/A 06/22/2014   Procedure: TRANSESOPHAGEAL ECHOCARDIOGRAM (TEE);  Surgeon: [name], MD;  Location: [facility] ENDOSCOPY;  Service: Cardiovascular;  Laterality: N/A;  . TEE WITHOUT CARDIOVERSION N/A 05/17/2016   Procedure: TRANSESOPHAGEAL ECHOCARDIOGRAM (TEE);  Surgeon: Thayer Headings, MD;  Location: [place];  Service: Cardiovascular;  Laterality: N/A;  . TEE WITHOUT CARDIOVERSION N/A 09/20/2016   Procedure: TRANSESOPHAGEAL ECHOCARDIOGRAM (TEE);  Surgeon: [name], MD;  Location: [place];  Service: Cardiovascular;  Laterality: N/A;  . TRANSURETHRAL RESECTION OF PROSTATE       Current Outpatient Prescriptions  Medication Sig Dispense Refill  . amiodarone (PACERONE) 200 MG tablet Take 1 tablet (200 mg total) by mouth daily. Starting on February 14 th 60 tablet 11  . amLODipine (NORVASC) 5 MG tablet Take 1 tablet (5 mg total) by mouth daily. 30 tablet 3  . clonazePAM (KLONOPIN) 1 MG tablet Take 1 tablet (1 mg total) by mouth at bedtime. 30 tablet 5  . DIGESTIVE ENZYMES PO Take 1 tablet by mouth 2 (two) times daily.     . ferrous sulfate 325 (65 FE) MG tablet Take 325 mg by mouth 2 (two) times daily with a meal.    . finasteride (PROSCAR) 5 MG tablet Take 5 mg by mouth daily.  11  . fish oil-omega-3 fatty acids 1000 MG capsule Take 1 g by mouth 2 (two) times daily.     . hydroxypropyl methylcellulose (ISOPTO TEARS) 2.5 % ophthalmic solution Place 1 drop into both eyes 3 (three) times daily as needed for dry eyes.     [name] levothyroxine (SYNTHROID, LEVOTHROID) 50 MCG tablet Take 50 mcg by mouth See admin instructions. On Tuesday thursdays and saturdays patient stats he takes a full tablet then take a half along with it on these days. 1 whole tablet rest of days    . Multiple Vitamin (MULTIVITAMIN) capsule Take 1 capsule by mouth daily.      . nitroGLYCERIN (NITROSTAT) 0.4 MG SL tablet Place 0.4 mg under the tongue every 5 (five) minutes as needed for chest pain.     . pantoprazole (PROTONIX) 40 MG tablet Take 40 mg by mouth 2 (two) times daily.     . pravastatin (PRAVACHOL) 40 MG tablet Take 40 mg by mouth every evening.    . traMADol (ULTRAM) 50 MG tablet Take 1 tablet (50 mg total) by mouth every 8 (eight) hours as needed. 30 tablet 0  . traZODone (DESYREL) 50 MG tablet Take by mouth at bedtime as needed for sleep.     [name] warfarin (COUMADIN) 5 MG tablet Take 5mg  on Monday Wednesday and fridays. Then take half of the 5mg  rest of days     No current facility-administered medications for this visit.     Allergies:   Metoprolol tartrate   Social History:  The patient  reports that he has never smoked. He has never used smokeless  tobacco. He reports that he does not drink alcohol or use drugs.   Family History:  The patient's family history includes Heart attack in his mother; Heart disease in his brother; Hypertension in his other.    ROS:  Please see the history of present illness.   Otherwise, review of systems is positive for Chest pain, palpitations, shortness of breath with exertion, constipation, difficulty urinating, balance problems, easy bruising, muscle pain.   All other systems are reviewed and negative.    PHYSICAL EXAM: VS:  BP 134/90   Pulse 96   Ht 6' (1.829 m)   Wt 186 lb 6.4 oz (84.6 kg)   BMI 25.28 kg/m  , BMI Body mass index is 25.28 kg/m. GEN: Well nourished, well developed, in no acute distress  HEENT: normal  Neck: no JVD, carotid bruits, or masses Cardiac: iRRR; no murmurs, rubs, or gallops,no edema  Respiratory:  clear to auscultation bilaterally, normal work of breathing GI: soft, nontender, nondistended, + BS MS: no deformity or atrophy  Skin: warm and dry Neuro:  Strength and sensation are intact Psych: euthymic mood, full affect  EKG:  EKG is not ordered today. Personal review of the  ekg ordered 11/02/16 shows atrial fibrillation, incomplete right bundle-branch block  Recent Labs: 09/18/2016: ALT 25; BUN 19; Creatinine, Ser 1.32; Hemoglobin 14.4; Platelets 181; Potassium 4.0; Sodium 139 09/28/2016: TSH 4.36    Lipid Panel     Component Value Date/Time   CHOL 130 10/07/2008 0927   TRIG 32.0 10/07/2008 0927   HDL 61.10 10/07/2008 0927   CHOLHDL 2 10/07/2008 0927   VLDL 6.4 10/07/2008 0927   LDLCALC 63 10/07/2008 0927     Wt Readings from Last 3 Encounters:  11/06/16 186 lb 6.4 oz (84.6 kg)  11/02/16 185 lb (83.9 kg)  10/01/16 185 lb (83.9 kg)      Other studies Reviewed: Additional studies/ records that were reviewed today include: Past records - personally reviewed, TEE 09/20/16  Review of the above records today demonstrates:  - Left ventricle: Systolic function was normal. The estimated   ejection fraction was in the range of 55% to 60%. No evidence of   thrombus. - Aortic valve: No evidence of vegetation. There was no stenosis.   There was mild regurgitation. - Mitral valve: There was moderate to severe regurgitation. - Left atrium: The atrium was dilated. No evidence of thrombus in   the atrial cavity or appendage. No evidence of thrombus in the   appendage. No evidence of thrombus in the atrial cavity or   appendage. - Right ventricle: Systolic function was normal. - Right atrium: The atrium was severely dilated. No evidence of   thrombus in the atrial cavity or appendage. - Atrial septum: No defect or patent foramen ovale was identified. - Tricuspid valve: No evidence of vegetation. - Pulmonic valve: No evidence of vegetation. - Pulmonary arteries: Systolic pressure was mildly increased. PA   peak pressure: 33 mm Hg (S).   ASSESSMENT AND PLAN:  1.  Symptomatically persistent atrial fibrillation: On Coumadin. Has had frequent cardioversions last one in March 2018. On amiodarone 200 mg twice a day. Currently his symptoms are of shortness of breath  and fatigue. I discussed with him the possibility of switching medications to Tikosyn, as well as AV nodal ablation with pacemaker placement. We discussed the fact that he may not have much improvement if we ablate his AV node if he will still be in atrial fibrillation. He does not wish to  have a pacemaker placed at this time, nor does he wish to have medication changes were made to be admitted to the hospital. We will thus plan for cardioversion.  This patients CHA2DS2-VASc Score and unadjusted Ischemic Stroke Rate (% per year) is equal to 4.8 % stroke rate/year from a score of 4  Above score calculated as 1 point each if present [CHF, HTN, DM, Vascular=MI/PAD/Aortic Plaque, Age if 65-74, or Male] Above score calculated as 2 points each if present [Age > 75, or Stroke/TIA/TE]   2. Hypertension: Currently well controlled. No changes necessary.  3. Coronary artery disease: No current chest pain.  4. OSA: on CPAP  Current medicines are reviewed at length with the patient today.   The patient does not have concerns regarding his medicines.  The following changes were made today:  none  Labs/ tests ordered today include:  Orders Placed This Encounter  Procedures  . Basic Metabolic Panel (BMET)  . CBC w/Diff     Disposition:   FU with Will Camnitz 3 months  Signed, Will Meredith Leeds, MD  11/06/2016 4:01 PM     [place] [address] [phone] (office) [phone] (fax)

## 2016-11-07 LAB — CBC WITH DIFFERENTIAL/PLATELET
BASOS ABS: 0 10*3/uL (ref 0.0–0.2)
BASOS: 0 %
EOS (ABSOLUTE): 0.1 10*3/uL (ref 0.0–0.4)
Eos: 1 %
Hematocrit: 40.6 % (ref 37.5–51.0)
Hemoglobin: 13.9 g/dL (ref 13.0–17.7)
Immature Grans (Abs): 0 10*3/uL (ref 0.0–0.1)
Immature Granulocytes: 0 %
LYMPHS ABS: 1.5 10*3/uL (ref 0.7–3.1)
LYMPHS: 23 %
MCH: 33.1 pg — AB (ref 26.6–33.0)
MCHC: 34.2 g/dL (ref 31.5–35.7)
MCV: 97 fL (ref 79–97)
MONOS ABS: 0.6 10*3/uL (ref 0.1–0.9)
Monocytes: 9 %
NEUTROS ABS: 4.3 10*3/uL (ref 1.4–7.0)
Neutrophils: 67 %
Platelets: 202 10*3/uL (ref 150–379)
RBC: 4.2 x10E6/uL (ref 4.14–5.80)
RDW: 14.3 % (ref 12.3–15.4)
WBC: 6.4 10*3/uL (ref 3.4–10.8)

## 2016-11-07 LAB — BASIC METABOLIC PANEL
BUN / CREAT RATIO: 15 (ref 10–24)
BUN: 19 mg/dL (ref 8–27)
CALCIUM: 9.2 mg/dL (ref 8.6–10.2)
CHLORIDE: 102 mmol/L (ref 96–106)
CO2: 20 mmol/L (ref 18–29)
Creatinine, Ser: 1.26 mg/dL (ref 0.76–1.27)
GFR calc Af Amer: 59 mL/min/{1.73_m2} — ABNORMAL LOW (ref >59)
GFR calc non Af Amer: 51 mL/min/{1.73_m2} — ABNORMAL LOW (ref >59)
Glucose: 92 mg/dL (ref 65–99)
POTASSIUM: 4.4 mmol/L (ref 3.5–5.2)
SODIUM: 142 mmol/L (ref 134–144)

## 2016-11-09 ENCOUNTER — Ambulatory Visit (HOSPITAL_COMMUNITY)
Admission: RE | Admit: 2016-11-09 | Discharge: 2016-11-09 | Disposition: A | Discharge status: Home / Self Care | Payer: Medicare Other | Source: Ambulatory Visit | Attending: Cardiovascular Disease | Admitting: Cardiovascular Disease

## 2016-11-09 ENCOUNTER — Encounter (HOSPITAL_COMMUNITY): Payer: Self-pay | Admitting: Certified Registered Nurse Anesthetist

## 2016-11-09 ENCOUNTER — Encounter (HOSPITAL_COMMUNITY)
Admission: RE | Disposition: A | Discharge status: Home / Self Care | Payer: Self-pay | Source: Ambulatory Visit | Attending: Cardiovascular Disease

## 2016-11-09 ENCOUNTER — Ambulatory Visit (HOSPITAL_COMMUNITY): Payer: Medicare Other | Admitting: Certified Registered Nurse Anesthetist

## 2016-11-09 DIAGNOSIS — I4891 Unspecified atrial fibrillation: Secondary | ICD-10-CM | POA: Diagnosis present

## 2016-11-09 DIAGNOSIS — D649 Anemia, unspecified: Secondary | ICD-10-CM | POA: Insufficient documentation

## 2016-11-09 DIAGNOSIS — I10 Essential (primary) hypertension: Secondary | ICD-10-CM | POA: Insufficient documentation

## 2016-11-09 DIAGNOSIS — G4730 Sleep apnea, unspecified: Secondary | ICD-10-CM | POA: Diagnosis not present

## 2016-11-09 DIAGNOSIS — F329 Major depressive disorder, single episode, unspecified: Secondary | ICD-10-CM | POA: Diagnosis not present

## 2016-11-09 DIAGNOSIS — I340 Nonrheumatic mitral (valve) insufficiency: Secondary | ICD-10-CM | POA: Insufficient documentation

## 2016-11-09 DIAGNOSIS — I491 Atrial premature depolarization: Secondary | ICD-10-CM | POA: Diagnosis not present

## 2016-11-09 DIAGNOSIS — K5790 Diverticulosis of intestine, part unspecified, without perforation or abscess without bleeding: Secondary | ICD-10-CM | POA: Diagnosis not present

## 2016-11-09 DIAGNOSIS — I480 Paroxysmal atrial fibrillation: Secondary | ICD-10-CM | POA: Diagnosis not present

## 2016-11-09 DIAGNOSIS — Z7901 Long term (current) use of anticoagulants: Secondary | ICD-10-CM | POA: Diagnosis not present

## 2016-11-09 DIAGNOSIS — I739 Peripheral vascular disease, unspecified: Secondary | ICD-10-CM | POA: Diagnosis not present

## 2016-11-09 DIAGNOSIS — Z888 Allergy status to other drugs, medicaments and biological substances status: Secondary | ICD-10-CM | POA: Diagnosis not present

## 2016-11-09 DIAGNOSIS — I4510 Unspecified right bundle-branch block: Secondary | ICD-10-CM | POA: Insufficient documentation

## 2016-11-09 DIAGNOSIS — I2510 Atherosclerotic heart disease of native coronary artery without angina pectoris: Secondary | ICD-10-CM | POA: Insufficient documentation

## 2016-11-09 DIAGNOSIS — Z955 Presence of coronary angioplasty implant and graft: Secondary | ICD-10-CM | POA: Diagnosis not present

## 2016-11-09 DIAGNOSIS — K2270 Barrett's esophagus without dysplasia: Secondary | ICD-10-CM | POA: Insufficient documentation

## 2016-11-09 DIAGNOSIS — I481 Persistent atrial fibrillation: Secondary | ICD-10-CM | POA: Diagnosis not present

## 2016-11-09 DIAGNOSIS — Z8249 Family history of ischemic heart disease and other diseases of the circulatory system: Secondary | ICD-10-CM | POA: Diagnosis not present

## 2016-11-09 DIAGNOSIS — Z79899 Other long term (current) drug therapy: Secondary | ICD-10-CM | POA: Insufficient documentation

## 2016-11-09 DIAGNOSIS — N400 Enlarged prostate without lower urinary tract symptoms: Secondary | ICD-10-CM | POA: Diagnosis not present

## 2016-11-09 DIAGNOSIS — K219 Gastro-esophageal reflux disease without esophagitis: Secondary | ICD-10-CM | POA: Diagnosis not present

## 2016-11-09 DIAGNOSIS — E039 Hypothyroidism, unspecified: Secondary | ICD-10-CM | POA: Insufficient documentation

## 2016-11-09 DIAGNOSIS — E785 Hyperlipidemia, unspecified: Secondary | ICD-10-CM | POA: Insufficient documentation

## 2016-11-09 HISTORY — PX: CARDIOVERSION: SHX1299

## 2016-11-09 LAB — PROTIME-INR
INR: 2.19
Prothrombin Time: 24.7 seconds — ABNORMAL HIGH (ref 11.4–15.2)

## 2016-11-09 SURGERY — CARDIOVERSION
Anesthesia: General

## 2016-11-09 MED ORDER — SODIUM CHLORIDE 0.9 % IV SOLN
INTRAVENOUS | Status: DC
  Administered 2016-11-09: 500 mL via INTRAVENOUS

## 2016-11-09 MED ORDER — SODIUM CHLORIDE 0.9 % IV SOLN
INTRAVENOUS | Status: DC | PRN
  Administered 2016-11-09: 12:00:00 via INTRAVENOUS

## 2016-11-09 MED ORDER — PROPOFOL 10 MG/ML IV BOLUS
INTRAVENOUS | Status: DC | PRN
  Administered 2016-11-09: 60 mg via INTRAVENOUS

## 2016-11-09 NOTE — Transfer of Care (Signed)
Immediate Anesthesia Transfer of Care Note  Patient: [name]  Procedure(s) Performed: Procedure(s): CARDIOVERSION (N/A)  Patient Location: Endoscopy Unit  Anesthesia Type:General  Level of Consciousness: awake and alert   Airway & Oxygen Therapy: Patient Spontanous Breathing and Patient connected to nasal cannula oxygen  Post-op Assessment: Report given to RN and Post -op Vital signs reviewed and stable  Post vital signs: Reviewed and stable  Last Vitals:  Vitals:   11/09/16 1215  BP: (!) 135/104  Pulse: (!) 109  Resp: 16  Temp: 36.5 C    Last Pain:  Vitals:   11/09/16 1215  TempSrc: Oral         Complications: No apparent anesthesia complications

## 2016-11-09 NOTE — Anesthesia Postprocedure Evaluation (Addendum)
Anesthesia Post Note  Patient: [name]  Procedure(s) Performed: Procedure(s) (LRB): CARDIOVERSION (N/A)  Patient location during evaluation: Endoscopy Anesthesia Type: General Level of consciousness: awake and alert Pain management: pain level controlled Vital Signs Assessment: post-procedure vital signs reviewed and stable Respiratory status: spontaneous breathing, nonlabored ventilation, respiratory function stable and patient connected to nasal cannula oxygen Cardiovascular status: blood pressure returned to baseline and stable Postop Assessment: no signs of nausea or vomiting Anesthetic complications: no       Last Vitals:  Vitals:   11/09/16 1310 11/09/16 1317  BP:  126/77  Pulse: (!) 55 (!) 57  Resp: 12 15  Temp:      Last Pain:  Vitals:   11/09/16 1309  TempSrc: Oral                 [name]

## 2016-11-09 NOTE — Anesthesia Procedure Notes (Signed)
Procedure Name: MAC Date/Time: 11/09/2016 12:45 PM Performed by: [name] [place] T Pre-anesthesia Checklist: Patient identified, Emergency Drugs available, Suction available and Patient being monitored Patient Re-evaluated:Patient Re-evaluated prior to inductionOxygen Delivery Method: Simple face mask Preoxygenation: Pre-oxygenation with 100% oxygen Intubation Type: IV induction Placement Confirmation: positive ETCO2 and breath sounds checked- equal and bilateral Dental Injury: Teeth and Oropharynx as per pre-operative assessment

## 2016-11-09 NOTE — Discharge Instructions (Signed)
Monitored Anesthesia Care Anesthesia is a term that refers to techniques, procedures, and medicines that help a person stay safe and comfortable during a medical procedure. Monitored anesthesia care, or sedation, is one type of anesthesia. Your anesthesia specialist may recommend sedation if you will be having a procedure that does not require you to be unconscious, such as:  Cataract surgery.  A dental procedure.  A biopsy.  A colonoscopy. During the procedure, you may receive a medicine to help you relax (sedative). There are three levels of sedation:  Mild sedation. At this level, you may feel awake and relaxed. You will be able to follow directions.  Moderate sedation. At this level, you will be sleepy. You may not remember the procedure.  Deep sedation. At this level, you will be asleep. You will not remember the procedure. The more medicine you are given, the deeper your level of sedation will be. Depending on how you respond to the procedure, the anesthesia specialist may change your level of sedation or the type of anesthesia to fit your needs. An anesthesia specialist will monitor you closely during the procedure. Let your health care provider know about:  Any allergies you have.  All medicines you are taking, including vitamins, herbs, eye drops, creams, and over-the-counter medicines.  Any use of steroids (by mouth or as a cream).  Any problems you or family members have had with sedatives and anesthetic medicines.  Any blood disorders you have.  Any surgeries you have had.  Any medical conditions you have, such as sleep apnea.  Whether you are pregnant or may be pregnant.  Any use of cigarettes, alcohol, or street drugs. What are the risks? Generally, this is a safe procedure. However, problems may occur, including:  Getting too much medicine (oversedation).  Nausea.  Allergic reaction to medicines.  Trouble breathing. If this happens, a breathing tube may be  used to help with breathing. It will be removed when you are awake and breathing on your own.  Heart trouble.  Lung trouble. Before the procedure Staying hydrated  Follow instructions from your health care provider about hydration, which may include:  Up to 2 hours before the procedure - you may continue to drink clear liquids, such as water, clear fruit juice, black coffee, and plain tea. Eating and drinking restrictions  Follow instructions from your health care provider about eating and drinking, which may include:  8 hours before the procedure - stop eating heavy meals or foods such as meat, fried foods, or fatty foods.  6 hours before the procedure - stop eating light meals or foods, such as toast or cereal.  6 hours before the procedure - stop drinking milk or drinks that contain milk.  2 hours before the procedure - stop drinking clear liquids. Medicines  Ask your health care provider about:  Changing or stopping your regular medicines. This is especially important if you are taking diabetes medicines or blood thinners.  Taking medicines such as aspirin and ibuprofen. These medicines can thin your blood. Do not take these medicines before your procedure if your health care provider instructs you not to. Tests and exams  You will have a physical exam.  You may have blood tests done to show:  How well your kidneys and liver are working.  How well your blood can clot.  General instructions  Plan to have someone take you home from the hospital or clinic.  If you will be going home right after the procedure, plan to  have someone with you for 24 hours. What happens during the procedure?  Your blood pressure, heart rate, breathing, level of pain and overall condition will be monitored.  An IV tube will be inserted into one of your veins.  Your anesthesia specialist will give you medicines as needed to keep you comfortable during the procedure. This may mean changing the  level of sedation.  The procedure will be performed. After the procedure  Your blood pressure, heart rate, breathing rate, and blood oxygen level will be monitored until the medicines you were given have worn off.  Do not drive for 24 hours if you received a sedative.  You may:  Feel sleepy, clumsy, or nauseous.  Feel forgetful about what happened after the procedure.  Have a sore throat if you had a breathing tube during the procedure.  Vomit. This information is not intended to replace advice given to you by your health care provider. Make sure you discuss any questions you have with your health care provider. Document Released: 03/29/2005 Document Revised: 12/10/2015 Document Reviewed: 10/24/2015 Elsevier Interactive Patient Education  2017 [employer]. Hospital doctor cardioversion is the delivery of a jolt of electricity to restore a normal rhythm to the heart. A rhythm that is too fast or is not regular keeps the heart from pumping well. In this procedure, sticky patches or metal paddles are placed on the chest to deliver electricity to the heart from a device. This procedure may be done in an emergency if:  There is low or no blood pressure as a result of the heart rhythm.  Normal rhythm must be restored as fast as possible to protect the brain and heart from further damage.  It may save a life. This procedure may also be done for irregular or fast heart rhythms that are not immediately life-threatening. Tell a health care provider about:  Any allergies you have.  All medicines you are taking, including vitamins, herbs, eye drops, creams, and over-the-counter medicines.  Any problems you or family members have had with anesthetic medicines.  Any blood disorders you have.  Any surgeries you have had.  Any medical conditions you have.  Whether you are pregnant or may be pregnant. What are the risks? Generally, this is a safe procedure.  However, problems may occur, including:  Allergic reactions to medicines.  A blood clot that breaks free and travels to other parts of your body.  The possible return of an abnormal heart rhythm within hours or days after the procedure.  Your heart stopping (cardiac arrest). This is rare. What happens before the procedure? Medicines   Your health care provider may have you start taking:  Blood-thinning medicines (anticoagulants) so your blood does not clot as easily.  Medicines may be given to help stabilize your heart rate and rhythm.  Ask your health care provider about changing or stopping your regular medicines. This is especially important if you are taking diabetes medicines or blood thinners. General instructions   Plan to have someone take you home from the hospital or clinic.  If you will be going home right after the procedure, plan to have someone with you for 24 hours.  Follow instructions from your health care provider about eating or drinking restrictions. What happens during the procedure?  To lower your risk of infection:  Your health care team will wash or sanitize their hands.  Your skin will be washed with soap.  An IV tube will be inserted into one of your  veins.  You will be given a medicine to help you relax (sedative).  Sticky patches (electrodes) or metal paddles may be placed on your chest.  An electrical shock will be delivered. The procedure may vary among health care providers and hospitals. What happens after the procedure?  Your blood pressure, heart rate, breathing rate, and blood oxygen level will be monitored until the medicines you were given have worn off.  Do not drive for 24 hours if you were given a sedative.  Your heart rhythm will be watched to make sure it does not change. This information is not intended to replace advice given to you by your health care provider. Make sure you discuss any questions you have with your health  care provider. Document Released: 06/23/2002 Document Revised: 03/01/2016 Document Reviewed: 01/07/2016 Elsevier Interactive Patient Education  2017 [employer].

## 2016-11-09 NOTE — Anesthesia Preprocedure Evaluation (Addendum)
Anesthesia Evaluation  Patient identified by MRN, date of birth, ID band Patient awake    Reviewed: Allergy & Precautions, NPO status , Patient's Chart, lab work & pertinent test results  History of Anesthesia Complications Negative for: history of anesthetic complications  Airway Mallampati: II  TM Distance: >3 FB Neck ROM: Full    Dental  (+) Dental Advisory Given, Teeth Intact,    Pulmonary sleep apnea and Continuous Positive Airway Pressure Ventilation ,    breath sounds clear to auscultation       Cardiovascular hypertension, Pt. on medications + CAD, + Cardiac Stents and + Peripheral Vascular Disease  + dysrhythmias Atrial Fibrillation  Rhythm:Irregular     Neuro/Psych PSYCHIATRIC DISORDERS Depression    GI/Hepatic Neg liver ROS, GERD  ,  Endo/Other  Hypothyroidism   Renal/GU Renal InsufficiencyRenal disease     Musculoskeletal   Abdominal   Peds  Hematology   Anesthesia Other Findings   Reproductive/Obstetrics                            Anesthesia Physical Anesthesia Plan  ASA: III  Anesthesia Plan: General   Post-op Pain Management:    Induction: Intravenous  Airway Management Planned: Mask  Additional Equipment: None  Intra-op Plan:   Post-operative Plan: Extubation in OR  Informed Consent: I have reviewed the patients History and Physical, chart, labs and discussed the procedure including the risks, benefits and alternatives for the proposed anesthesia with the patient or authorized representative who has indicated his/her understanding and acceptance.   Dental advisory given  Plan Discussed with: CRNA and Anesthesiologist  Anesthesia Plan Comments:        Anesthesia Quick Evaluation

## 2016-11-09 NOTE — CV Procedure (Signed)
Electrical Cardioversion Procedure Note [name] [id number] 1926/12/16  Procedure: Electrical Cardioversion Indications:  Atrial Fibrillation  Procedure Details Consent: Risks of procedure as well as the alternatives and risks of each were explained to the (patient/caregiver).  Consent for procedure obtained. Time Out: Verified patient identification, verified procedure, site/side was marked, verified correct patient position, special equipment/implants available, medications/allergies/relevent history reviewed, required imaging and test results available.  Performed  Patient placed on cardiac monitor, pulse oximetry, supplemental oxygen as necessary.  Sedation given: Propofol 70 mg Pacer pads placed anterior and posterior chest.  Cardioverted 1 time(s).  Cardioverted at 150J.  Evaluation Findings: Post procedure EKG shows: sinus rhythm with frequent PACs Complications: None Patient did tolerate procedure well.   Kevin Latch, MD 11/09/2016, 12:53 PM

## 2016-11-10 ENCOUNTER — Encounter (HOSPITAL_COMMUNITY): Payer: Self-pay | Admitting: Cardiovascular Disease

## 2016-11-13 NOTE — H&P (Signed)
[name] is a 83 y.o. male who has presented today for surgery, with the diagnosis of atrial fibrillation. The various methods of treatment have been discussed with the patient and family. After consideration of risks, benefits and other options for treatment, the patient has consented to Procedure(s): Direct current cardioversion as a surgical intervention . The patient's history has been reviewed, patient examined, no change in status, stable for surgery. I have reviewed the patient's chart and labs. Questions were answered to the patient's satisfaction.   [name] C. Oval [name], MD, [facility]  11/13/2016

## 2016-11-15 ENCOUNTER — Encounter: Payer: Self-pay | Admitting: Cardiology

## 2016-11-15 ENCOUNTER — Ambulatory Visit (INDEPENDENT_AMBULATORY_CARE_PROVIDER_SITE_OTHER): Payer: Medicare Other | Admitting: Cardiology

## 2016-11-15 ENCOUNTER — Telehealth: Payer: Self-pay | Admitting: Cardiology

## 2016-11-15 VITALS — BP 112/70 | HR 113 | Ht 72.0 in | Wt 186.2 lb

## 2016-11-15 DIAGNOSIS — I481 Persistent atrial fibrillation: Secondary | ICD-10-CM

## 2016-11-15 DIAGNOSIS — I2510 Atherosclerotic heart disease of native coronary artery without angina pectoris: Secondary | ICD-10-CM

## 2016-11-15 DIAGNOSIS — I4819 Other persistent atrial fibrillation: Secondary | ICD-10-CM

## 2016-11-15 MED ORDER — DILTIAZEM HCL ER COATED BEADS 180 MG PO CP24: 180.0000 mg | ORAL_CAPSULE | Freq: Every day | ORAL | 3 refills | Status: AC

## 2016-11-15 NOTE — Progress Notes (Signed)
Electrophysiology Office Note   Date:  11/15/2016   ID:  [name], [name] [name], [name], MRN [id number]  PCP:  Nyoka Cowden, MD  Cardiologist:  [place]ala Primary Electrophysiologist:  Orvis Stann Meredith Leeds, MD    Chief Complaint  Patient presents with  . Atrial Fibrillation     History of Present Illness: [name] is a 83 y.o. male who is being seen today for the evaluation of atrial fibrillation at the request of Marletta Lor, MD. Presenting today for electrophysiology evaluation. History of persistent atrial fibrillation on amiodarone. He has had 7 cardioversion since September 2015, his last one in Kevin 2018. He is in general weak with ambulation of more so with atrial fibrillation. He is not on rate control medication 2 drops in heart rate and blood pressure. When on beta blocker she complains of low energy, anxiety, and a foggy feeling. Amiodarone was recently increased to 200 mg twice a day for rate control.His main symptom today is of fatigue. He says that he has trouble getting out of the chair and moving around his house. He says that he has tried one other antiarrhythmic for his atrial fibrillation, but he cannot November the name. He does not remember needing to be admitted to the hospital for that other medication.   Today, he denies symptoms of chest pain, orthopnea, PND, lower 70 edema, claudication, dizziness, or syncope. He had a cardioversion last week and says that he went back out of rhythm 2 days later. His main symptoms are mild palpitations and overwhelming sense of fatigue. His wife has to help him to get into the bed at night. His heart rates, as recorded at home, have been anywhere from 70-120. He does remain on amiodarone. Today, he denies symptoms of palpitations, chest pain, shortness of breath, orthopnea, PND, lower extremity edema, claudication, dizziness, presyncope, syncope, bleeding, or neurologic sequela. The patient is tolerating  medications without difficulties.    Past Medical History:  Diagnosis Date  . Anemia   . Barrett's esophagus   . BENIGN PROSTATIC HYPERTROPHY   . Chronic anticoagulation    a. on Coumadin  . CORONARY ARTERY DISEASE    a. s/p stent OM 2002. b. s/p BMS 2006 RCA  c. s/p cath 2007.  [name] DEPRESSION   . DIVERTICULOSIS, COLON   . GERD   . GYNECOMASTIA, UNILATERAL   . Hematoma of abdominal wall   . History of echocardiogram    a. EF 55% (TEE 06/2014)  . HYPERLIPIDEMIA   . HYPERTENSION   . HYPOTHYROIDISM   . Mallory - Weiss tear    a. > 5 years ago  . Mitral regurgitation    a. Mild-mod by TEE 06/2014.  [name] Paroxysmal A-fib (HCC)    a. chronic amiodarone and coumadin;  b. 05/2013 s/p DCCV. c. 06/2014 s/p TEE/DCCV.  [name] Popliteal artery embolism, right ([place])   . SLEEP APNEA    CPAP   Past Surgical History:  Procedure Laterality Date  . cardioversion  8/08  . CARDIOVERSION  09/14/2011   Procedure: CARDIOVERSION;  Surgeon: Josue Hector, MD;  Location: [facility] ENDOSCOPY;  Service: Cardiovascular;  Laterality: N/A;  . CARDIOVERSION N/A 03/23/2014   Procedure: CARDIOVERSION;  Surgeon: Thompson Grayer, MD;  Location: [place];  Service: Cardiovascular;  Laterality: N/A;  . CARDIOVERSION N/A 06/22/2014   Procedure: CARDIOVERSION;  Surgeon: [name], MD;  Location: [facility] ENDOSCOPY;  Service: Cardiovascular;  Laterality: N/A;  . CARDIOVERSION N/A 04/27/2015   Procedure: CARDIOVERSION;  Surgeon: Josue Hector, MD;  Location: [facility] ENDOSCOPY;  Service: Cardiovascular;  Laterality: N/A;  . CARDIOVERSION N/A 07/16/2015   Procedure: CARDIOVERSION;  Surgeon: [name], MD;  Location: [place];  Service: Cardiovascular;  Laterality: N/A;  . CARDIOVERSION N/A 11/26/2015   Procedure: CARDIOVERSION;  Surgeon: Sanda Klein, MD;  Location: [facility] ENDOSCOPY;  Service: Cardiovascular;  Laterality: N/A;  . CARDIOVERSION N/A 05/17/2016   Procedure: CARDIOVERSION;  Surgeon: Thayer Headings, MD;  Location: [place];  Service: Cardiovascular;  Laterality: N/A;  . CARDIOVERSION N/A 09/20/2016   Procedure: CARDIOVERSION;  Surgeon: [name], MD;  Location: [facility] ENDOSCOPY;  Service: Cardiovascular;  Laterality: N/A;  . CARDIOVERSION N/A 11/09/2016   Procedure: CARDIOVERSION;  Surgeon: [name], MD;  Location: [facility] ENDOSCOPY;  Service: Cardiovascular;  Laterality: N/A;  . CORONARY ANGIOPLASTY WITH STENT PLACEMENT    . ESOPHAGOGASTRODUODENOSCOPY    . TEE WITH CARDIOVERSION  7/08  . TEE WITHOUT CARDIOVERSION  09/14/2011   Procedure: TRANSESOPHAGEAL ECHOCARDIOGRAM (TEE);  Surgeon: Josue Hector, MD;  Location: [facility] ENDOSCOPY;  Service: Cardiovascular;  Laterality: N/A;  . TEE WITHOUT CARDIOVERSION N/A 06/22/2014   Procedure: TRANSESOPHAGEAL ECHOCARDIOGRAM (TEE);  Surgeon: [name], MD;  Location: [facility] ENDOSCOPY;  Service: Cardiovascular;  Laterality: N/A;  . TEE WITHOUT CARDIOVERSION N/A 05/17/2016   Procedure: TRANSESOPHAGEAL ECHOCARDIOGRAM (TEE);  Surgeon: Thayer Headings, MD;  Location: [place];  Service: Cardiovascular;  Laterality: N/A;  . TEE WITHOUT CARDIOVERSION N/A 09/20/2016   Procedure: TRANSESOPHAGEAL ECHOCARDIOGRAM (TEE);  Surgeon: [name], MD;  Location: [place];  Service: Cardiovascular;  Laterality: N/A;  . TRANSURETHRAL RESECTION OF PROSTATE       Current Outpatient Prescriptions  Medication Sig Dispense Refill  . amiodarone (PACERONE) 200 MG tablet Take 1 tablet (200 mg total) by mouth daily. Starting on February 14 th 60 tablet 11  . amLODipine (NORVASC) 5 MG tablet Take 1 tablet (5 mg total) by mouth daily. 30 tablet 3  . clonazePAM (KLONOPIN) 1 MG tablet Take 1 tablet (1 mg total) by mouth at bedtime. 30 tablet 5  . Coenzyme Q10 (CO Q-10 PO) Take 100 mg by mouth daily.    [name] DIGESTIVE ENZYMES PO Take 1 tablet by mouth 2 (two) times daily.     . ferrous sulfate 325 (65 FE) MG tablet Take 325 mg by mouth 2 (two) times daily with a meal.    .  finasteride (PROSCAR) 5 MG tablet Take 5 mg by mouth daily.  11  . fish oil-omega-3 fatty acids 1000 MG capsule Take 1 g by mouth 2 (two) times daily.     . hydroxypropyl methylcellulose (ISOPTO TEARS) 2.5 % ophthalmic solution Place 1 drop into both eyes 3 (three) times daily as needed for dry eyes.     [name] levothyroxine (SYNTHROID, LEVOTHROID) 50 MCG tablet Take 50 mcg by mouth See admin instructions. On Tuesday thursdays and saturdays patient stats he takes a full tablet then take a half along with it on these days. 1 whole tablet rest of days    . Multiple Vitamin (MULTIVITAMIN) capsule Take 1 capsule by mouth daily.    . nitroGLYCERIN (NITROSTAT) 0.4 MG SL tablet Place 0.4 mg under the tongue every 5 (five) minutes as needed for chest pain.     . pantoprazole (PROTONIX) 40 MG tablet Take 40 mg by mouth 2 (two) times daily.     . pravastatin (PRAVACHOL) 40 MG tablet Take 40 mg by mouth every evening.    [name]  traMADol (ULTRAM) 50 MG tablet Take 1 tablet (50 mg total) by mouth every 8 (eight) hours as needed. 30 tablet 0  . traZODone (DESYREL) 50 MG tablet Take by mouth at bedtime as needed for sleep.     [name] warfarin (COUMADIN) 5 MG tablet Take 5mg  on Monday Wednesday and fridays. Then take half of the 5mg  rest of days     No current facility-administered medications for this visit.     Allergies:   Metoprolol tartrate   Social History:  The patient  reports that he has never smoked. He has never used smokeless tobacco. He reports that he does not drink alcohol or use drugs.   Family History:  The patient's family history includes Heart attack in his mother; Heart disease in his brother; Hypertension in his other.    ROS:  Please see the history of present illness.   Otherwise, review of systems is positive for Fatigue, chest pain, palpitations, hearing loss, dyspnea on exertion.   All other systems are reviewed and negative.    PHYSICAL EXAM: VS:  BP 112/70   Pulse (!) 113   Ht 6' (1.829 m)    Wt 186 lb 3.2 oz (84.5 kg)   BMI 25.25 kg/m  , BMI Body mass index is 25.25 kg/m. GEN: Well nourished, well developed, in no acute distress  HEENT: normal  Neck: no JVD, carotid bruits, or masses Cardiac: iRRR; no murmurs, rubs, or gallops,no edema  Respiratory:  clear to auscultation bilaterally, normal work of breathing GI: soft, nontender, nondistended, + BS MS: no deformity or atrophy  Skin: warm and dry Neuro:  Strength and sensation are intact Psych: euthymic mood, full affect   EKG:  EKG is ordered today. Personal review of the ekg ordered shows atrial fibrillation, incomplete right bundle branch block  Recent Labs: 09/18/2016: ALT 25; Hemoglobin 14.4 09/28/2016: TSH 4.36 11/06/2016: BUN 19; Creatinine, Ser 1.26; Platelets 202; Potassium 4.4; Sodium 142    Lipid Panel     Component Value Date/Time   CHOL 130 10/07/2008 0927   TRIG 32.0 10/07/2008 0927   HDL 61.10 10/07/2008 0927   CHOLHDL 2 10/07/2008 0927   VLDL 6.4 10/07/2008 0927   LDLCALC 63 10/07/2008 0927     Wt Readings from Last 3 Encounters:  05/02/[name] 186 lb 3.2 oz (84.5 kg)  04/26/[name] 186 lb (84.4 kg)  04/23/[name] 186 lb 6.4 oz (84.6 kg)      Other studies Reviewed: Additional studies/ records that were reviewed today include: Past records - personally reviewed, TEE 3/7/[name]  Review of the above records today demonstrates:  - Left ventricle: Systolic function was normal. The estimated   ejection fraction was in the range of 55% to 60%. No evidence of   thrombus. - Aortic valve: No evidence of vegetation. There was no stenosis.   There was mild regurgitation. - Mitral valve: There was moderate to severe regurgitation. - Left atrium: The atrium was dilated. No evidence of thrombus in   the atrial cavity or appendage. No evidence of thrombus in the   appendage. No evidence of thrombus in the atrial cavity or   appendage. - Right ventricle: Systolic function was normal. - Right atrium: The atrium was  severely dilated. No evidence of   thrombus in the atrial cavity or appendage. - Atrial septum: No defect or patent foramen ovale was identified. - Tricuspid valve: No evidence of vegetation. - Pulmonic valve: No evidence of vegetation. - Pulmonary arteries: Systolic pressure was mildly increased.  PA   peak pressure: 33 mm Hg (S).   ASSESSMENT AND PLAN:  1.  Symptomatically persistent atrial fibrillation: Currently on Coumadin. He had a cardioversion and quickly went back into atrial fibrillation. He has been on amiodarone for a long period of time. According to his son, he has been admitted to the hospital for medications for atrial fibrillation but is since been switched to amiodarone. Unfortunately, it does not appear that the amiodarone is working to control his atrial fibrillation. It is likely that we Ieisha Gao not be able to maintain sinus rhythm. We did discuss the possibility of AV nodal ablation and pacemaker, but he is not on any rate controlling medications. We'll stop his amiodarone today and start him on diltiazem for a rate control strategy.  This patients CHA2DS2-VASc Score and unadjusted Ischemic Stroke Rate (% per year) is equal to 4.8 % stroke rate/year from a score of 4  Above score calculated as 1 point each if present [CHF, HTN, DM, Vascular=MI/PAD/Aortic Plaque, Age if 65-74, or Male] Above score calculated as 2 points each if present [Age > 75, or Stroke/TIA/TE]   2. Hypertension: well-controlled today. No changes necessary.  3. Coronary artery disease:  no current chest pain.  4. OSA: on CPAP  Current medicines are reviewed at length with the patient today.   The patient does not have concerns regarding his medicines.  The following changes were made today:   Stop amiodarone, start diltiazem  Labs/ tests ordered today include:  No orders of the defined types were placed in this encounter.    Disposition:   FU with Cortlin Marano 3 months  Signed, Cleavon Goldman Meredith Leeds, MD  11/15/2016 11:03 AM     [facility] [address] [place] [address] [phone] (office) [phone] (fax)

## 2016-11-15 NOTE — Patient Instructions (Signed)
Medication Instructions:   Your physician has recommended you make the following change in your medication:  1) STOP Amiodarone 2) START Diltiazem 180 mg once daily  Labwork:  None ordered  Testing/Procedures:  None ordered  Follow-Up:  Your physician recommends that you schedule a follow-up appointment in: 3 months with Dr. Curt Bears.  - If you need a refill on your cardiac medications before your next appointment, please call your pharmacy.   Any Other Special Instructions Will Be Listed Below (If Applicable). - Check heart rates at home and call the office if your heart rates consistently remain above 100.  Thank you for choosing [facility]!!   Trinidad Curet, RN [phone]

## 2016-11-15 NOTE — Telephone Encounter (Signed)
New message    Pt is calling to let Sherri know he takes 2.5 mg of amlodipine once daily.

## 2016-11-16 ENCOUNTER — Ambulatory Visit (HOSPITAL_COMMUNITY): Payer: Medicare Other | Admitting: Nurse Practitioner

## 2016-11-16 MED ORDER — AMLODIPINE BESYLATE 5 MG PO TABS: 2.5000 mg | ORAL_TABLET | Freq: Every day | ORAL | 3 refills | Status: DC

## 2016-11-16 NOTE — Telephone Encounter (Signed)
Chart updated to reflect correct medication dosing he is taking.

## 2016-12-05 ENCOUNTER — Ambulatory Visit (INDEPENDENT_AMBULATORY_CARE_PROVIDER_SITE_OTHER): Payer: Medicare Other | Admitting: Pharmacist Clinician (PhC)/ Clinical Pharmacy Specialist

## 2016-12-05 DIAGNOSIS — I2510 Atherosclerotic heart disease of native coronary artery without angina pectoris: Secondary | ICD-10-CM | POA: Diagnosis not present

## 2016-12-05 DIAGNOSIS — Z5181 Encounter for therapeutic drug level monitoring: Secondary | ICD-10-CM

## 2016-12-05 LAB — POCT INR: INR: 1.9

## 2016-12-16 ENCOUNTER — Other Ambulatory Visit: Payer: Self-pay | Admitting: Cardiology

## 2016-12-18 NOTE — Addendum Note (Signed)
Addendum  created 12/18/16 1426 by Oleta Mouse, MD   Sign clinical note

## 2016-12-22 NOTE — Addendum Note (Signed)
Addendum  created 12/22/16 1041 by Hasset Chaviano, MD   Sign clinical note    

## 2016-12-26 NOTE — Progress Notes (Signed)
HPI The patient presents for followup after recent visits for atrial fibrillation. Since I last saw him he was back in atrial fib and was seen in the atrial fib clinic.  He was started in bid amiodarone.  However, he has had persistent symptomatic atrial fib and was seen in consultation by Dr. Curt Bears.  The amiodarone was stopped and it was decided to try rate control with Cardizem.  They did briefly discuss AV ablation and a pacemaker. He's actually been doing relatively well since he started the Cardizem. He brings a blood pressure diary and his pressures have been well controlled. His heart rate is typically in the 90s and I'm not seeing any sustained above 110. He's tolerating the medications. He's not had any presyncope or syncope. He does have tiredness as he always has and he takes naps and falls asleep easily.  Allergies  Allergen Reactions  . Metoprolol Tartrate Other (See Comments)    Anxiety, heart rate goes too low, feels like in a fog.    Current Outpatient Prescriptions  Medication Sig Dispense Refill  . clonazePAM (KLONOPIN) 1 MG tablet Take 1 tablet (1 mg total) by mouth at bedtime. 30 tablet 5  . Coenzyme Q10 (CO Q-10 PO) Take 100 mg by mouth daily.    [name] DIGESTIVE ENZYMES PO Take 1 tablet by mouth 2 (two) times daily.     [name] diltiazem (CARDIZEM CD) 180 MG 24 hr capsule Take 1 capsule (180 mg total) by mouth daily. 30 capsule 3  . ferrous sulfate 325 (65 FE) MG tablet Take 325 mg by mouth 2 (two) times daily with a meal.    . finasteride (PROSCAR) 5 MG tablet Take 5 mg by mouth daily.  11  . fish oil-omega-3 fatty acids 1000 MG capsule Take 1 g by mouth 2 (two) times daily.     . hydroxypropyl methylcellulose (ISOPTO TEARS) 2.5 % ophthalmic solution Place 1 drop into both eyes 3 (three) times daily as needed for dry eyes.     [name] levothyroxine (SYNTHROID, LEVOTHROID) 50 MCG tablet Take 50 mcg by mouth See admin instructions. On Tuesday thursdays and saturdays patient stats he  takes a full tablet then take a half along with it on these days. 1 whole tablet rest of days    . Multiple Vitamin (MULTIVITAMIN) capsule Take 1 capsule by mouth daily.    . nitroGLYCERIN (NITROSTAT) 0.4 MG SL tablet Place 0.4 mg under the tongue every 5 (five) minutes as needed for chest pain.     . pantoprazole (PROTONIX) 40 MG tablet Take 40 mg by mouth 2 (two) times daily.     . pravastatin (PRAVACHOL) 40 MG tablet Take 40 mg by mouth every evening.    . traMADol (ULTRAM) 50 MG tablet Take 1 tablet (50 mg total) by mouth every 8 (eight) hours as needed. 30 tablet 0  . traZODone (DESYREL) 50 MG tablet Take by mouth at bedtime as needed for sleep.     [name] warfarin (COUMADIN) 5 MG tablet Take 5mg  on Monday Wednesday and fridays. Then take half of the 5mg  rest of days    . warfarin (COUMADIN) 5 MG tablet TAKE 1/2 TO 1 TABLET DAILY OR AS DIRECTED BY COUMADIN CLINIC 90 tablet 1   No current facility-administered medications for this visit.     Past Medical History:  Diagnosis Date  . Anemia   . Barrett's esophagus   . BENIGN PROSTATIC HYPERTROPHY   . Chronic anticoagulation  a. on Coumadin  . CORONARY ARTERY DISEASE    a. s/p stent OM 2002. b. s/p BMS 2006 RCA  c. s/p cath 2007.  [name] DEPRESSION   . DIVERTICULOSIS, COLON   . GERD   . GYNECOMASTIA, UNILATERAL   . Hematoma of abdominal wall   . History of echocardiogram    a. EF 55% (TEE 06/2014)  . HYPERLIPIDEMIA   . HYPERTENSION   . HYPOTHYROIDISM   . Mallory - Weiss tear    a. > 5 years ago  . Mitral regurgitation    a. Mild-mod by TEE 06/2014.  [name] Paroxysmal A-fib (HCC)    a. chronic amiodarone and coumadin;  b. 05/2013 s/p DCCV. c. 06/2014 s/p TEE/DCCV.  [name] Popliteal artery embolism, right ([place])   . SLEEP APNEA    CPAP    Past Surgical History:  Procedure Laterality Date  . cardioversion  8/08  . CARDIOVERSION  09/14/2011   Procedure: CARDIOVERSION;  Surgeon: Josue Hector, MD;  Location: [place];  Service:  Cardiovascular;  Laterality: N/A;  . CARDIOVERSION N/A 03/23/2014   Procedure: CARDIOVERSION;  Surgeon: Thompson Grayer, MD;  Location: [place];  Service: Cardiovascular;  Laterality: N/A;  . CARDIOVERSION N/A 06/22/2014   Procedure: CARDIOVERSION;  Surgeon: [name], MD;  Location: [facility] ENDOSCOPY;  Service: Cardiovascular;  Laterality: N/A;  . CARDIOVERSION N/A 04/27/2015   Procedure: CARDIOVERSION;  Surgeon: Josue Hector, MD;  Location: [facility] ENDOSCOPY;  Service: Cardiovascular;  Laterality: N/A;  . CARDIOVERSION N/A 07/16/2015   Procedure: CARDIOVERSION;  Surgeon: [name], MD;  Location: [place];  Service: Cardiovascular;  Laterality: N/A;  . CARDIOVERSION N/A 11/26/2015   Procedure: CARDIOVERSION;  Surgeon: Sanda Klein, MD;  Location[facility] ENDOSCOPY;  Service: Cardiovascular;  Laterality: N/A;  . CARDIOVERSION N/A 05/17/2016   Procedure: CARDIOVERSION;  Surgeon: Thayer Headings, MD;  Location: [place];  Service: Cardiovascular;  Laterality: N/A;  . CARDIOVERSION N/A 09/20/2016   Procedure: CARDIOVERSION;  Surgeon: [name], MD;  Location: [facility] ENDOSCOPY;  Service: Cardiovascular;  Laterality: N/A;  . CARDIOVERSION N/A 11/09/2016   Procedure: CARDIOVERSION;  Surgeon: [name], MD;  Location: [facility] ENDOSCOPY;  Service: Cardiovascular;  Laterality: N/A;  . CORONARY ANGIOPLASTY WITH STENT PLACEMENT    . ESOPHAGOGASTRODUODENOSCOPY    . TEE WITH CARDIOVERSION  7/08  . TEE WITHOUT CARDIOVERSION  09/14/2011   Procedure: TRANSESOPHAGEAL ECHOCARDIOGRAM (TEE);  Surgeon: Josue Hector, MD;  Location: [facility] ENDOSCOPY;  Service: Cardiovascular;  Laterality: N/A;  . TEE WITHOUT CARDIOVERSION N/A 06/22/2014   Procedure: TRANSESOPHAGEAL ECHOCARDIOGRAM (TEE);  Surgeon: [name], MD;  Location: [facility] ENDOSCOPY;  Service: Cardiovascular;  Laterality: N/A;  . TEE WITHOUT CARDIOVERSION N/A 05/17/2016   Procedure: TRANSESOPHAGEAL ECHOCARDIOGRAM (TEE);  Surgeon: Thayer Headings, MD;   Location: [place];  Service: Cardiovascular;  Laterality: N/A;  . TEE WITHOUT CARDIOVERSION N/A 09/20/2016   Procedure: TRANSESOPHAGEAL ECHOCARDIOGRAM (TEE);  Surgeon: [name], MD;  Location: [place];  Service: Cardiovascular;  Laterality: N/A;  . TRANSURETHRAL RESECTION OF PROSTATE      ROS:  Positive for constipation.  Otherwise as stated in the HPI and negative for all other systems.  PHYSICAL EXAM BP (!) 144/78   Pulse 89   Ht 6' (1.829 m)   Wt 186 lb (84.4 kg)   BMI 25.23 kg/m   NECK:  No jugular venous distention, waveform within normal limits, carotid upstroke brisk and symmetric, no bruits, no thyromegaly LUNGS:  Clear to auscultation bilaterally  CHEST:  Unremarkable HEART:  PMI not displaced or sustained,S1 and S2 within normal limits, no S3, no clicks, no rubs, 3/6 holosystolic murmur radiating into the apex and no diastolic murmurs, irregular ABD:  Flat, positive bowel sounds normal in frequency in pitch, no bruits, no rebound, no guarding, no midline pulsatile mass, no hepatomegaly, no splenomegaly EXT:  2 plus pulses throughout, mild ankle edema, no cyanosis no clubbing   Lab Results  Component Value Date   TSH 4.36 09/28/2016   ALT 25 09/18/2016   AST 33 09/18/2016   ALKPHOS 70 09/18/2016   BILITOT 1.0 09/18/2016   PROT 6.6 09/18/2016   ALBUMIN 3.6 09/18/2016     ASSESSMENT AND PLAN  ATRIAL FIBRILLATION - Mr. [name] has a CHA2DS2 - VASc score of 5 with a risk of stroke of 6.7%.   At this point he seems to have reasonable rate control. He's been very sensitive to medications so I'm not inclined to increase his Cardizem at this point. He will continue with anticoagulation.  CARDIOMYOPATHY - I will reassess an echocardiogram in a few months.  BETA BLOCKER INTOLERANCE - This was added to his list of allergies at a previous appt.  CORONARY ARTERY DISEASE -  The patient has no new sypmtoms.  No further cardiovascular testing is  indicated.  We will continue with aggressive risk reduction and meds as listed.    HYPERTENSION -  The blood pressure is well controlled.  He will continue the meds as listed.   MITRAL REGURGITATION -  This was moderate on the TEE in March.  I will follow up with an echo in a couple of months.   FATIGUE - Surprisingly he's feeling better although he is now in atrial fibrillation permanently.    Hospital, Atrial Fibrillation Clinic records, EP records reviewed for this appointment.

## 2016-12-28 ENCOUNTER — Encounter: Payer: Self-pay | Admitting: Cardiology

## 2016-12-28 ENCOUNTER — Ambulatory Visit (INDEPENDENT_AMBULATORY_CARE_PROVIDER_SITE_OTHER): Payer: Medicare Other | Admitting: Cardiology

## 2016-12-28 ENCOUNTER — Ambulatory Visit (INDEPENDENT_AMBULATORY_CARE_PROVIDER_SITE_OTHER): Payer: Medicare Other | Admitting: Pharmacist

## 2016-12-28 VITALS — BP 144/78 | HR 89 | Ht 72.0 in | Wt 186.0 lb

## 2016-12-28 DIAGNOSIS — Z5181 Encounter for therapeutic drug level monitoring: Secondary | ICD-10-CM

## 2016-12-28 DIAGNOSIS — I4821 Permanent atrial fibrillation: Secondary | ICD-10-CM | POA: Insufficient documentation

## 2016-12-28 DIAGNOSIS — I2510 Atherosclerotic heart disease of native coronary artery without angina pectoris: Secondary | ICD-10-CM

## 2016-12-28 DIAGNOSIS — I340 Nonrheumatic mitral (valve) insufficiency: Secondary | ICD-10-CM

## 2016-12-28 DIAGNOSIS — R5382 Chronic fatigue, unspecified: Secondary | ICD-10-CM

## 2016-12-28 DIAGNOSIS — I482 Chronic atrial fibrillation: Secondary | ICD-10-CM | POA: Diagnosis not present

## 2016-12-28 DIAGNOSIS — I10 Essential (primary) hypertension: Secondary | ICD-10-CM

## 2016-12-28 LAB — POCT INR: INR: 2.2

## 2016-12-28 NOTE — Patient Instructions (Signed)
Medication Instructions:  continue current medications  Labwork: None Ordered  Testing/Procedures: None Ordered  Follow-Up: Your physician recommends that you schedule a follow-up appointment in: 1 Month   Any Other Special Instructions Will Be Listed Below (If Applicable).   If you need a refill on your cardiac medications before your next appointment, please call your pharmacy.

## 2017-01-04 ENCOUNTER — Ambulatory Visit: Payer: Medicare Other | Admitting: Internal Medicine

## 2017-01-05 ENCOUNTER — Emergency Department (HOSPITAL_COMMUNITY): Payer: Medicare Other

## 2017-01-05 ENCOUNTER — Inpatient Hospital Stay (HOSPITAL_COMMUNITY)
Admission: EM | Admit: 2017-01-05 | Discharge: 2017-01-08 | DRG: 493 | Disposition: A | Discharge status: Skilled Nursing Facility | Payer: Medicare Other | Attending: Internal Medicine | Admitting: Internal Medicine

## 2017-01-05 ENCOUNTER — Encounter (HOSPITAL_COMMUNITY): Payer: Self-pay

## 2017-01-05 ENCOUNTER — Other Ambulatory Visit: Payer: Self-pay

## 2017-01-05 DIAGNOSIS — Z7901 Long term (current) use of anticoagulants: Secondary | ICD-10-CM

## 2017-01-05 DIAGNOSIS — Z419 Encounter for procedure for purposes other than remedying health state, unspecified: Secondary | ICD-10-CM

## 2017-01-05 DIAGNOSIS — Z955 Presence of coronary angioplasty implant and graft: Secondary | ICD-10-CM | POA: Diagnosis not present

## 2017-01-05 DIAGNOSIS — S82891A Other fracture of right lower leg, initial encounter for closed fracture: Secondary | ICD-10-CM | POA: Diagnosis present

## 2017-01-05 DIAGNOSIS — R262 Difficulty in walking, not elsewhere classified: Secondary | ICD-10-CM | POA: Diagnosis not present

## 2017-01-05 DIAGNOSIS — I480 Paroxysmal atrial fibrillation: Secondary | ICD-10-CM | POA: Diagnosis present

## 2017-01-05 DIAGNOSIS — Z8249 Family history of ischemic heart disease and other diseases of the circulatory system: Secondary | ICD-10-CM

## 2017-01-05 DIAGNOSIS — K219 Gastro-esophageal reflux disease without esophagitis: Secondary | ICD-10-CM | POA: Diagnosis present

## 2017-01-05 DIAGNOSIS — F329 Major depressive disorder, single episode, unspecified: Secondary | ICD-10-CM | POA: Diagnosis present

## 2017-01-05 DIAGNOSIS — S82402A Unspecified fracture of shaft of left fibula, initial encounter for closed fracture: Secondary | ICD-10-CM

## 2017-01-05 DIAGNOSIS — S82891B Other fracture of right lower leg, initial encounter for open fracture type I or II: Secondary | ICD-10-CM | POA: Diagnosis not present

## 2017-01-05 DIAGNOSIS — I10 Essential (primary) hypertension: Secondary | ICD-10-CM | POA: Diagnosis not present

## 2017-01-05 DIAGNOSIS — W010XXA Fall on same level from slipping, tripping and stumbling without subsequent striking against object, initial encounter: Secondary | ICD-10-CM | POA: Diagnosis present

## 2017-01-05 DIAGNOSIS — E039 Hypothyroidism, unspecified: Secondary | ICD-10-CM | POA: Diagnosis not present

## 2017-01-05 DIAGNOSIS — K5900 Constipation, unspecified: Secondary | ICD-10-CM | POA: Diagnosis present

## 2017-01-05 DIAGNOSIS — I2510 Atherosclerotic heart disease of native coronary artery without angina pectoris: Secondary | ICD-10-CM | POA: Diagnosis present

## 2017-01-05 DIAGNOSIS — Z888 Allergy status to other drugs, medicaments and biological substances status: Secondary | ICD-10-CM

## 2017-01-05 DIAGNOSIS — N400 Enlarged prostate without lower urinary tract symptoms: Secondary | ICD-10-CM | POA: Diagnosis present

## 2017-01-05 DIAGNOSIS — G4730 Sleep apnea, unspecified: Secondary | ICD-10-CM | POA: Diagnosis present

## 2017-01-05 DIAGNOSIS — R278 Other lack of coordination: Secondary | ICD-10-CM | POA: Diagnosis not present

## 2017-01-05 DIAGNOSIS — Z86718 Personal history of other venous thrombosis and embolism: Secondary | ICD-10-CM

## 2017-01-05 DIAGNOSIS — S82209A Unspecified fracture of shaft of unspecified tibia, initial encounter for closed fracture: Secondary | ICD-10-CM | POA: Diagnosis not present

## 2017-01-05 DIAGNOSIS — K2270 Barrett's esophagus without dysplasia: Secondary | ICD-10-CM | POA: Diagnosis present

## 2017-01-05 DIAGNOSIS — S82201S Unspecified fracture of shaft of right tibia, sequela: Secondary | ICD-10-CM | POA: Diagnosis not present

## 2017-01-05 DIAGNOSIS — I482 Chronic atrial fibrillation: Secondary | ICD-10-CM | POA: Diagnosis present

## 2017-01-05 DIAGNOSIS — I481 Persistent atrial fibrillation: Secondary | ICD-10-CM | POA: Diagnosis not present

## 2017-01-05 DIAGNOSIS — Z4789 Encounter for other orthopedic aftercare: Secondary | ICD-10-CM | POA: Diagnosis not present

## 2017-01-05 DIAGNOSIS — M6281 Muscle weakness (generalized): Secondary | ICD-10-CM | POA: Diagnosis not present

## 2017-01-05 DIAGNOSIS — Y92007 Garden or yard of unspecified non-institutional (private) residence as the place of occurrence of the external cause: Secondary | ICD-10-CM | POA: Diagnosis not present

## 2017-01-05 DIAGNOSIS — N62 Hypertrophy of breast: Secondary | ICD-10-CM | POA: Diagnosis present

## 2017-01-05 DIAGNOSIS — N179 Acute kidney failure, unspecified: Secondary | ICD-10-CM | POA: Diagnosis not present

## 2017-01-05 DIAGNOSIS — S9301XA Subluxation of right ankle joint, initial encounter: Secondary | ICD-10-CM | POA: Diagnosis not present

## 2017-01-05 DIAGNOSIS — S82841A Displaced bimalleolar fracture of right lower leg, initial encounter for closed fracture: Principal | ICD-10-CM

## 2017-01-05 DIAGNOSIS — S82841S Displaced bimalleolar fracture of right lower leg, sequela: Secondary | ICD-10-CM | POA: Diagnosis not present

## 2017-01-05 DIAGNOSIS — N183 Chronic kidney disease, stage 3 (moderate): Secondary | ICD-10-CM | POA: Diagnosis present

## 2017-01-05 DIAGNOSIS — E785 Hyperlipidemia, unspecified: Secondary | ICD-10-CM | POA: Diagnosis present

## 2017-01-05 DIAGNOSIS — S82201A Unspecified fracture of shaft of right tibia, initial encounter for closed fracture: Secondary | ICD-10-CM

## 2017-01-05 DIAGNOSIS — D649 Anemia, unspecified: Secondary | ICD-10-CM | POA: Diagnosis present

## 2017-01-05 DIAGNOSIS — S8990XA Unspecified injury of unspecified lower leg, initial encounter: Secondary | ICD-10-CM | POA: Diagnosis not present

## 2017-01-05 DIAGNOSIS — S82401S Unspecified fracture of shaft of right fibula, sequela: Secondary | ICD-10-CM | POA: Diagnosis not present

## 2017-01-05 DIAGNOSIS — S82401A Unspecified fracture of shaft of right fibula, initial encounter for closed fracture: Secondary | ICD-10-CM | POA: Diagnosis present

## 2017-01-05 DIAGNOSIS — S86191A Other injury of other muscle(s) and tendon(s) of posterior muscle group at lower leg level, right leg, initial encounter: Secondary | ICD-10-CM | POA: Diagnosis not present

## 2017-01-05 DIAGNOSIS — Z79899 Other long term (current) drug therapy: Secondary | ICD-10-CM

## 2017-01-05 DIAGNOSIS — S82841B Displaced bimalleolar fracture of right lower leg, initial encounter for open fracture type I or II: Secondary | ICD-10-CM | POA: Diagnosis not present

## 2017-01-05 DIAGNOSIS — I4891 Unspecified atrial fibrillation: Secondary | ICD-10-CM | POA: Diagnosis not present

## 2017-01-05 DIAGNOSIS — I129 Hypertensive chronic kidney disease with stage 1 through stage 4 chronic kidney disease, or unspecified chronic kidney disease: Secondary | ICD-10-CM | POA: Diagnosis present

## 2017-01-05 DIAGNOSIS — I4819 Other persistent atrial fibrillation: Secondary | ICD-10-CM | POA: Diagnosis present

## 2017-01-05 DIAGNOSIS — G8918 Other acute postprocedural pain: Secondary | ICD-10-CM | POA: Diagnosis not present

## 2017-01-05 DIAGNOSIS — F419 Anxiety disorder, unspecified: Secondary | ICD-10-CM | POA: Diagnosis present

## 2017-01-05 DIAGNOSIS — S82832A Other fracture of upper and lower end of left fibula, initial encounter for closed fracture: Secondary | ICD-10-CM | POA: Diagnosis not present

## 2017-01-05 DIAGNOSIS — R41841 Cognitive communication deficit: Secondary | ICD-10-CM | POA: Diagnosis not present

## 2017-01-05 DIAGNOSIS — S82392A Other fracture of lower end of left tibia, initial encounter for closed fracture: Secondary | ICD-10-CM | POA: Diagnosis not present

## 2017-01-05 DIAGNOSIS — R488 Other symbolic dysfunctions: Secondary | ICD-10-CM | POA: Diagnosis not present

## 2017-01-05 DIAGNOSIS — M25571 Pain in right ankle and joints of right foot: Secondary | ICD-10-CM | POA: Diagnosis not present

## 2017-01-05 DIAGNOSIS — S82202A Unspecified fracture of shaft of left tibia, initial encounter for closed fracture: Secondary | ICD-10-CM

## 2017-01-05 DIAGNOSIS — S82301A Unspecified fracture of lower end of right tibia, initial encounter for closed fracture: Secondary | ICD-10-CM | POA: Diagnosis not present

## 2017-01-05 LAB — CBC WITH DIFFERENTIAL/PLATELET
BASOS PCT: 1 %
Basophils Absolute: 0 10*3/uL (ref 0.0–0.1)
EOS ABS: 0.1 10*3/uL (ref 0.0–0.7)
Eosinophils Relative: 2 %
HCT: 37.6 % — ABNORMAL LOW (ref 39.0–52.0)
Hemoglobin: 12.5 g/dL — ABNORMAL LOW (ref 13.0–17.0)
LYMPHS ABS: 0.9 10*3/uL (ref 0.7–4.0)
Lymphocytes Relative: 14 %
MCH: 32 pg (ref 26.0–34.0)
MCHC: 33.2 g/dL (ref 30.0–36.0)
MCV: 96.2 fL (ref 78.0–100.0)
MONO ABS: 0.8 10*3/uL (ref 0.1–1.0)
MONOS PCT: 12 %
Neutro Abs: 4.6 10*3/uL (ref 1.7–7.7)
Neutrophils Relative %: 71 %
Platelets: 187 10*3/uL (ref 150–400)
RBC: 3.91 MIL/uL — ABNORMAL LOW (ref 4.22–5.81)
RDW: 14.2 % (ref 11.5–15.5)
WBC: 6.5 10*3/uL (ref 4.0–10.5)

## 2017-01-05 LAB — COMPREHENSIVE METABOLIC PANEL
ALBUMIN: 3.8 g/dL (ref 3.5–5.0)
ALK PHOS: 83 U/L (ref 38–126)
ALT: 20 U/L (ref 17–63)
ANION GAP: 7 (ref 5–15)
AST: 29 U/L (ref 15–41)
BILIRUBIN TOTAL: 0.5 mg/dL (ref 0.3–1.2)
BUN: 20 mg/dL (ref 6–20)
CALCIUM: 9 mg/dL (ref 8.9–10.3)
CO2: 25 mmol/L (ref 22–32)
Chloride: 106 mmol/L (ref 101–111)
Creatinine, Ser: 1.57 mg/dL — ABNORMAL HIGH (ref 0.61–1.24)
GFR calc non Af Amer: 38 mL/min — ABNORMAL LOW (ref >60)
GFR, EST AFRICAN AMERICAN: 44 mL/min — AB (ref >60)
GLUCOSE: 115 mg/dL — AB (ref 65–99)
POTASSIUM: 4.1 mmol/L (ref 3.5–5.1)
SODIUM: 138 mmol/L (ref 135–145)
TOTAL PROTEIN: 6.8 g/dL (ref 6.5–8.1)

## 2017-01-05 LAB — PROTIME-INR
INR: 2.17
Prothrombin Time: 24.5 seconds — ABNORMAL HIGH (ref 11.4–15.2)

## 2017-01-05 MED ORDER — BISACODYL 5 MG PO TBEC: 5.0000 mg | DELAYED_RELEASE_TABLET | Freq: Every day | ORAL | Status: DC | PRN

## 2017-01-05 MED ORDER — TRAZODONE HCL 50 MG PO TABS
50.0000 mg | ORAL_TABLET | Freq: Every evening | ORAL | Status: DC | PRN
  Filled 2017-01-05: qty 1

## 2017-01-05 MED ORDER — CLONAZEPAM 1 MG PO TABS
1.0000 mg | ORAL_TABLET | Freq: Every day | ORAL | Status: DC
  Administered 2017-01-05 – 2017-01-07 (×3): 1 mg via ORAL
  Filled 2017-01-05 (×3): qty 1

## 2017-01-05 MED ORDER — LEVOTHYROXINE SODIUM 75 MCG PO TABS: 75.0000 ug | ORAL_TABLET | ORAL | Status: DC

## 2017-01-05 MED ORDER — SODIUM CHLORIDE 0.9% FLUSH
3.0000 mL | Freq: Two times a day (BID) | INTRAVENOUS | Status: DC
  Administered 2017-01-07 – 2017-01-08 (×3): 3 mL via INTRAVENOUS

## 2017-01-05 MED ORDER — PANTOPRAZOLE SODIUM 40 MG PO TBEC
40.0000 mg | DELAYED_RELEASE_TABLET | Freq: Two times a day (BID) | ORAL | Status: DC
  Administered 2017-01-05 – 2017-01-08 (×5): 40 mg via ORAL
  Filled 2017-01-05 (×5): qty 1

## 2017-01-05 MED ORDER — HYDROCODONE-ACETAMINOPHEN 5-325 MG PO TABS
1.0000 | ORAL_TABLET | ORAL | Status: DC | PRN
  Administered 2017-01-05 – 2017-01-08 (×4): 2 via ORAL
  Filled 2017-01-05 (×5): qty 2

## 2017-01-05 MED ORDER — SODIUM CHLORIDE 0.9 % IV SOLN
Freq: Once | INTRAVENOUS | Status: AC
  Administered 2017-01-05: 20:00:00 via INTRAVENOUS

## 2017-01-05 MED ORDER — POLYETHYLENE GLYCOL 3350 17 G PO PACK: 17.0000 g | PACK | Freq: Every day | ORAL | Status: DC | PRN

## 2017-01-05 MED ORDER — MORPHINE SULFATE (PF) 4 MG/ML IV SOLN
4.0000 mg | Freq: Once | INTRAVENOUS | Status: AC
Start: 2017-01-05 — End: 2017-01-05
  Administered 2017-01-05: 4 mg via INTRAVENOUS
  Filled 2017-01-05: qty 1

## 2017-01-05 MED ORDER — LEVOTHYROXINE SODIUM 50 MCG PO TABS: 50.0000 ug | ORAL_TABLET | ORAL | Status: DC

## 2017-01-05 MED ORDER — PHYTONADIONE 5 MG PO TABS
10.0000 mg | ORAL_TABLET | Freq: Once | ORAL | Status: AC
  Administered 2017-01-05: 10 mg via ORAL
  Filled 2017-01-05: qty 2

## 2017-01-05 MED ORDER — DOCUSATE SODIUM 100 MG PO CAPS
100.0000 mg | ORAL_CAPSULE | Freq: Two times a day (BID) | ORAL | Status: DC
  Administered 2017-01-05 – 2017-01-08 (×4): 100 mg via ORAL
  Filled 2017-01-05 (×5): qty 1

## 2017-01-05 MED ORDER — HYPROMELLOSE (GONIOSCOPIC) 2.5 % OP SOLN
1.0000 [drp] | Freq: Three times a day (TID) | OPHTHALMIC | Status: DC | PRN
  Filled 2017-01-05: qty 15

## 2017-01-05 MED ORDER — LEVOTHYROXINE SODIUM 50 MCG PO TABS
50.0000 ug | ORAL_TABLET | ORAL | Status: DC
  Administered 2017-01-07 – 2017-01-08 (×2): 50 ug via ORAL
  Filled 2017-01-05: qty 1

## 2017-01-05 MED ORDER — FINASTERIDE 5 MG PO TABS
5.0000 mg | ORAL_TABLET | Freq: Every day | ORAL | Status: DC
  Administered 2017-01-07 – 2017-01-08 (×2): 5 mg via ORAL
  Filled 2017-01-05 (×2): qty 1

## 2017-01-05 MED ORDER — PRAVASTATIN SODIUM 40 MG PO TABS
40.0000 mg | ORAL_TABLET | Freq: Every evening | ORAL | Status: DC
  Filled 2017-01-05: qty 1

## 2017-01-05 MED ORDER — MORPHINE SULFATE (PF) 4 MG/ML IV SOLN
1.0000 mg | INTRAVENOUS | Status: DC | PRN
  Administered 2017-01-05 – 2017-01-07 (×3): 1 mg via INTRAVENOUS
  Filled 2017-01-05 (×3): qty 1

## 2017-01-05 MED ORDER — MORPHINE SULFATE (PF) 4 MG/ML IV SOLN
4.0000 mg | Freq: Once | INTRAVENOUS | Status: AC
  Administered 2017-01-05: 4 mg via INTRAVENOUS
  Filled 2017-01-05: qty 1

## 2017-01-05 MED ORDER — DILTIAZEM HCL ER COATED BEADS 180 MG PO CP24
180.0000 mg | ORAL_CAPSULE | Freq: Every day | ORAL | Status: DC
  Administered 2017-01-07: 180 mg via ORAL
  Filled 2017-01-05 (×2): qty 1

## 2017-01-05 NOTE — ED Triage Notes (Addendum)
Pt tripped and fell in his yard this morning and injured his R ankle. Now unable to move ankle and experiencing extreme pain. Pt also reports a separate incident this morning where he passed out. He states that it was d/t his afib. A&Ox4.

## 2017-01-05 NOTE — ED Provider Notes (Signed)
[facility] DEPT Provider Note   CSN: [id number] Arrival date & time: 01/05/17  1535     History   Chief Complaint Chief Complaint  Patient presents with  . Ankle Injury  . Loss of Consciousness    HPI [name] is a 83 y.o. male.  The history is provided by the patient. No language interpreter was used.  Ankle Injury  This is a new problem. The current episode started 3 to 5 hours ago. The problem occurs constantly. The problem has not changed since onset.Pertinent negatives include no chest pain and no headaches. Nothing aggravates the symptoms. Nothing relieves the symptoms. He has tried nothing for the symptoms. The treatment provided no relief.  Loss of Consciousness   Pertinent negatives include chest pain and headaches.  Pt denies passing out.  Pt reports e fell backwards on a hill.  Pt reports he had trouble getting himself up. Pt has a history of atrial fibrillation.  He is on coumadin   Past Medical History:  Diagnosis Date  . Anemia   . Barrett's esophagus   . BENIGN PROSTATIC HYPERTROPHY   . Chronic anticoagulation    a. on Coumadin  . CORONARY ARTERY DISEASE    a. s/p stent OM 2002. b. s/p BMS 2006 RCA  c. s/p cath 2007.  [name] DEPRESSION   . DIVERTICULOSIS, COLON   . GERD   . GYNECOMASTIA, UNILATERAL   . Hematoma of abdominal wall   . History of echocardiogram    a. EF 55% (TEE 06/2014)  . HYPERLIPIDEMIA   . HYPERTENSION   . HYPOTHYROIDISM   . Mallory - Weiss tear    a. > 5 years ago  . Mitral regurgitation    a. Mild-mod by TEE 06/2014.  [name] Paroxysmal A-fib (HCC)    a. chronic amiodarone and coumadin;  b. 05/2013 s/p DCCV. c. 06/2014 s/p TEE/DCCV.  [name] Popliteal artery embolism, right ([place])   . SLEEP APNEA    CPAP    Patient Active Problem List   Diagnosis Date Noted  . Permanent atrial fibrillation ([place]) 12/28/2016  . Atrial fibrillation with RVR ([place])   . Persistent atrial fibrillation ([place]) 11/26/2015  . Anemia   . Fatigue 06/26/2014  .  Chronic anticoagulation   . Iron deficiency anemia 01/21/2014  . Encounter for therapeutic drug monitoring 09/10/2013  . Paroxysmal a-fib ([place]), Rapid Ventricular Response   . Insomnia 11/25/2012  . Parasomnia 11/04/2012  . Long term (current) use of anticoagulants - Coumadin 09/01/2011  . GYNECOMASTIA, UNILATERAL 03/23/2010  . WEAKNESS 11/09/2009  . Backache 09/16/2009  . HLD (hyperlipidemia) 10/20/2008  . Mitral regurgitation 10/20/2008  . DEPRESSION 12/17/2007  . Hypothyroidism 05/20/2007  . OSA (obstructive sleep apnea) 05/20/2007  . Essential hypertension 04/10/2007  . CAD (coronary artery disease) 04/10/2007  . GERD 04/10/2007  . DIVERTICULOSIS, COLON 04/10/2007  . BENIGN PROSTATIC HYPERTROPHY 04/10/2007    Past Surgical History:  Procedure Laterality Date  . cardioversion  8/08  . CARDIOVERSION  09/14/2011   Procedure: CARDIOVERSION;  Surgeon: Josue Hector, MD;  Location: [facility] ENDOSCOPY;  Service: Cardiovascular;  Laterality: N/A;  . CARDIOVERSION N/A 03/23/2014   Procedure: CARDIOVERSION;  Surgeon: Thompson Grayer, MD;  Location: [place];  Service: Cardiovascular;  Laterality: N/A;  . CARDIOVERSION N/A 06/22/2014   Procedure: CARDIOVERSION;  Surgeon: [name], MD;  Location: [facility] ENDOSCOPY;  Service: Cardiovascular;  Laterality: N/A;  . CARDIOVERSION N/A 04/27/2015   Procedure: CARDIOVERSION;  Surgeon: Josue Hector, MD;  Location:  [place] ENDOSCOPY;  Service: Cardiovascular;  Laterality: N/A;  . CARDIOVERSION N/A 07/16/2015   Procedure: CARDIOVERSION;  Surgeon: [name], MD;  Location: [place];  Service: Cardiovascular;  Laterality: N/A;  . CARDIOVERSION N/A 11/26/2015   Procedure: CARDIOVERSION;  Surgeon: Sanda Klein, MD;  Location: [place] ENDOSCOPY;  Service: Cardiovascular;  Laterality: N/A;  . CARDIOVERSION N/A 05/17/2016   Procedure: CARDIOVERSION;  Surgeon: Thayer Headings, MD;  Location: [place];  Service: Cardiovascular;  Laterality: N/A;  .  CARDIOVERSION N/A 09/20/2016   Procedure: CARDIOVERSION;  Surgeon: [name], MD;  Location: [facility] ENDOSCOPY;  Service: Cardiovascular;  Laterality: N/A;  . CARDIOVERSION N/A 11/09/2016   Procedure: CARDIOVERSION;  Surgeon: [name], MD;  Location: [facility] ENDOSCOPY;  Service: Cardiovascular;  Laterality: N/A;  . CORONARY ANGIOPLASTY WITH STENT PLACEMENT    . ESOPHAGOGASTRODUODENOSCOPY    . TEE WITH CARDIOVERSION  7/08  . TEE WITHOUT CARDIOVERSION  09/14/2011   Procedure: TRANSESOPHAGEAL ECHOCARDIOGRAM (TEE);  Surgeon: Josue Hector, MD;  Location: [facility] ENDOSCOPY;  Service: Cardiovascular;  Laterality: N/A;  . TEE WITHOUT CARDIOVERSION N/A 06/22/2014   Procedure: TRANSESOPHAGEAL ECHOCARDIOGRAM (TEE);  Surgeon: [name], MD;  Location: [facility] ENDOSCOPY;  Service: Cardiovascular;  Laterality: N/A;  . TEE WITHOUT CARDIOVERSION N/A 05/17/2016   Procedure: TRANSESOPHAGEAL ECHOCARDIOGRAM (TEE);  Surgeon: Thayer Headings, MD;  Location: [place];  Service: Cardiovascular;  Laterality: N/A;  . TEE WITHOUT CARDIOVERSION N/A 09/20/2016   Procedure: TRANSESOPHAGEAL ECHOCARDIOGRAM (TEE);  Surgeon: [name], MD;  Location: [place];  Service: Cardiovascular;  Laterality: N/A;  . TRANSURETHRAL RESECTION OF PROSTATE         Home Medications    Prior to Admission medications   Medication Sig Start Date End Date Taking? Authorizing Provider  clonazePAM (KLONOPIN) 1 MG tablet Take 1 tablet (1 mg total) by mouth at bedtime. 01/08/15  Yes Marletta Lor, MD  Coenzyme Q10 (CO Q-10 PO) Take 100 mg by mouth daily.   Yes [provider]  DIGESTIVE ENZYMES PO Take 1 tablet by mouth 2 (two) times daily.    Yes [provider]  diltiazem (CARDIZEM CD) 180 MG 24 hr capsule Take 1 capsule (180 mg total) by mouth daily. 11/15/16  Yes Camnitz, Will Hassell Done, MD  finasteride (PROSCAR) 5 MG tablet Take 5 mg by mouth daily. 12/22/14  Yes [provider]  fish oil-omega-3  fatty acids 1000 MG capsule Take 1 g by mouth 2 (two) times daily.    Yes [provider]  hydroxypropyl methylcellulose (ISOPTO TEARS) 2.5 % ophthalmic solution Place 1 drop into both eyes 3 (three) times daily as needed for dry eyes.    Yes [provider]  levothyroxine (SYNTHROID, LEVOTHROID) 50 MCG tablet Take 50 mcg by mouth See admin instructions. On Tuesday thursdays and saturdays patient stats he takes a full tablet then take a half along with it on these days. 1 whole tablet rest of days   Yes [provider]  Multiple Vitamin (MULTIVITAMIN) capsule Take 1 capsule by mouth daily.   Yes [provider]  pantoprazole (PROTONIX) 40 MG tablet Take 40 mg by mouth 2 (two) times daily.    Yes [provider]  pravastatin (PRAVACHOL) 40 MG tablet Take 40 mg by mouth every evening. 03/07/11  Yes Imogene Burn, PA-C  traZODone (DESYREL) 50 MG tablet Take by mouth at bedtime as needed for sleep.    Yes [provider]  warfarin (COUMADIN) 5 MG tablet Take  5 mg by mouth daily at 6 PM. Take 5mg  by mouth daily on Monday and friday. Take 2.5mg  by mouth daily on Tuesday, Wednesday, Thursday, Saturday, and Sunday.   Yes [provider]  nitroGLYCERIN (NITROSTAT) 0.4 MG SL tablet Place 0.4 mg under the tongue every 5 (five) minutes as needed for chest pain.     [provider]  traMADol (ULTRAM) 50 MG tablet Take 1 tablet (50 mg total) by mouth every 8 (eight) hours as needed. Patient not taking: Reported on 01/05/2017 08/01/16   Briscoe Deutscher, DO  warfarin (COUMADIN) 5 MG tablet TAKE 1/2 TO 1 TABLET DAILY OR AS DIRECTED BY COUMADIN CLINIC Patient not taking: Reported on 01/05/2017 12/18/16   Minus Breeding, MD    Family History Family History  Problem Relation Age of Onset  . Heart attack Mother   . Hypertension Other   . Heart disease Brother        CAD male 1st degree relative  . Stroke Neg Hx     Social History Social  History  Substance Use Topics  . Smoking status: Never Smoker  . Smokeless tobacco: Never Used  . Alcohol use No     Allergies   Metoprolol tartrate   Review of Systems Review of Systems  Cardiovascular: Positive for syncope. Negative for chest pain.  Neurological: Negative for headaches.  All other systems reviewed and are negative.    Physical Exam Updated Vital Signs BP 121/80 (BP Location: Right Arm)   Pulse 99   Temp 97.9 F (36.6 C) (Oral)   Resp 16   SpO2 98%   Physical Exam  Constitutional: He is oriented to person, place, and time. He appears well-developed and well-nourished.  HENT:  Head: Normocephalic.  Eyes: EOM are normal.  Neck: Normal range of motion.  Pulmonary/Chest: Effort normal.  Abdominal: He exhibits no distension.  Musculoskeletal: He exhibits deformity.  Swollen bruised right ankle,  nv and ns intact  Neurological: He is alert and oriented to person, place, and time.  Skin: Skin is warm.  Psychiatric: He has a normal mood and affect.  Nursing note and vitals reviewed.    ED Treatments / Results  Labs (all labs ordered are listed, but only abnormal results are displayed) Labs Reviewed  CBC WITH DIFFERENTIAL/PLATELET  COMPREHENSIVE METABOLIC PANEL  PROTIME-INR    EKG  EKG Interpretation None       Radiology Dg Ankle Complete Right  Result Date: 01/05/2017 CLINICAL DATA:  Recent fall with ankle pain and swelling, initial encounter EXAM: RIGHT ANKLE - COMPLETE 3+ VIEW COMPARISON:  None. FINDINGS: Transverse fractures are noted through the lateral and medial malleolus with some lateral displacement of the distal fracture fragments and lateral displacement of the talus with respect to the distal tibia. No posterior malleolar fracture is seen. The talus and calcaneus appear within normal limits. IMPRESSION: Distal tibial and fibular fractures with lateral displacement of the distal fracture fragments as well as the talus with respect  to the more proximal tibia. Electronically Signed   By: Inez Catalina M.D.   On: 01/05/2017 16:05    Procedures Procedures (including critical care time)  Medications Ordered in ED Medications  morphine 4 MG/ML injection 4 mg (not administered)   Consult to Dr. Novella Olive, he reports pt will need surgery,  Ask that pt be admitted at [facility]   Hospitalist consulted.  No surgery tonight.   Initial Impression / Assessment and Plan / ED Course  I have reviewed the  triage vital signs and the nursing notes.  Pertinent labs & imaging results that were available during my care of the patient were reviewed by me and considered in my medical decision making (see chart for details).  Clinical Course as of Jan 06 1623  Fri Jan 05, 2017  1611 DG Ankle Complete Right [LS]    Clinical Course User Index [LS] Fransico Meadow, [place]      Final Clinical Impressions(s) / ED Diagnoses   Final diagnoses:  Tibia/fibula fracture, left, closed, initial encounter    New Prescriptions New Prescriptions   No medications on file  Pt placed in a posterior splint Iv pain medication. Dr. Denton Brick will admit   Fransico Meadow, PA-C 01/05/17 1804    Malvin Johns, MD 01/05/17 2255

## 2017-01-05 NOTE — ED Notes (Signed)
ED Provider at bedside. 

## 2017-01-05 NOTE — H&P (Signed)
History and Physical    [name] YNW:[id number] DOB: 1927-07-15 DOA: 01/05/2017  PCP: Marletta Lor, MD  Patient coming from: home  Chief Complaint: fall and ankle injury   HPI: [name] is a 83 y.o. male with medical history significant of chronic atrial fibrillation on Coumadin, CAD status post stent placement, hypertension, hyperlipidemia, hypothyroidism, sleep apnea on CPAP who presents after a fall. He states that he was in the yard doing some work. He was on his knees and tried to use his cane to get up when he felt dizzy. He fell backward and had sharp pains in his right ankle. He does not know if he lost consciousness or not, possibly blacked out for a couple of seconds. There were no witnesses to event. He eventually was able to crawl to his garage and called his son. Otherwise, he had been in good health. No recent illnesses, no chest pains or heart palpitations, no cough, shortness of breath, no nausea, vomiting, diarrhea, abdominal pain. He has been constipated.  ED Course: Right ankle x-ray completed which showed distal tibial and fibular fractures. Orthopedic surgery was consulted. They recommend patient transfer to [facility], monitor INR, and surgery this weekend.  Review of Systems: As per HPI otherwise 10 point review of systems negative.   Past Medical History:  Diagnosis Date  . Anemia   . Barrett's esophagus   . BENIGN PROSTATIC HYPERTROPHY   . Chronic anticoagulation    a. on Coumadin  . CORONARY ARTERY DISEASE    a. s/p stent OM 2002. b. s/p BMS 2006 RCA  c. s/p cath 2007.  [name] DEPRESSION   . DIVERTICULOSIS, COLON   . GERD   . GYNECOMASTIA, UNILATERAL   . Hematoma of abdominal wall   . History of echocardiogram    a. EF 55% (TEE 06/2014)  . HYPERLIPIDEMIA   . HYPERTENSION   . HYPOTHYROIDISM   . Mallory - Weiss tear    a. > 5 years ago  . Mitral regurgitation    a. Mild-mod by TEE 06/2014.  [name] Paroxysmal A-fib (HCC)    a. chronic  amiodarone and coumadin;  b. 05/2013 s/p DCCV. c. 06/2014 s/p TEE/DCCV.  [name] Popliteal artery embolism, right ([place])   . SLEEP APNEA    CPAP    Past Surgical History:  Procedure Laterality Date  . cardioversion  8/08  . CARDIOVERSION  09/14/2011   Procedure: CARDIOVERSION;  Surgeon: Josue Hector, MD;  Location: [place];  Service: Cardiovascular;  Laterality: N/A;  . CARDIOVERSION N/A 03/23/2014   Procedure: CARDIOVERSION;  Surgeon: Thompson Grayer, MD;  Location: [place];  Service: Cardiovascular;  Laterality: N/A;  . CARDIOVERSION N/A 06/22/2014   Procedure: CARDIOVERSION;  Surgeon: [name], MD;  Location: [facility] ENDOSCOPY;  Service: Cardiovascular;  Laterality: N/A;  . CARDIOVERSION N/A 04/27/2015   Procedure: CARDIOVERSION;  Surgeon: Josue Hector, MD;  Location: [facility] ENDOSCOPY;  Service: Cardiovascular;  Laterality: N/A;  . CARDIOVERSION N/A 07/16/2015   Procedure: CARDIOVERSION;  Surgeon: [name], MD;  Location: [facility] ENDOSCOPY;  Service: Cardiovascular;  Laterality: N/A;  . CARDIOVERSION N/A 11/26/2015   Procedure: CARDIOVERSION;  Surgeon: Sanda Klein, MD;  Location: [facility] ENDOSCOPY;  Service: Cardiovascular;  Laterality: N/A;  . CARDIOVERSION N/A 05/17/2016   Procedure: CARDIOVERSION;  Surgeon: Thayer Headings, MD;  Location: [place];  Service: Cardiovascular;  Laterality: N/A;  . CARDIOVERSION N/A 09/20/2016   Procedure: CARDIOVERSION;  Surgeon: [name], MD;  Location:  [facility];  Service: Cardiovascular;  Laterality: N/A;  . CARDIOVERSION N/A 11/09/2016   Procedure: CARDIOVERSION;  Surgeon: [name], MD;  Location: [place];  Service: Cardiovascular;  Laterality: N/A;  . CORONARY ANGIOPLASTY WITH STENT PLACEMENT    . ESOPHAGOGASTRODUODENOSCOPY    . TEE WITH CARDIOVERSION  7/08  . TEE WITHOUT CARDIOVERSION  09/14/2011   Procedure: TRANSESOPHAGEAL ECHOCARDIOGRAM (TEE);  Surgeon: Josue Hector, MD;  Location: [facility] ENDOSCOPY;  Service: Cardiovascular;   Laterality: N/A;  . TEE WITHOUT CARDIOVERSION N/A 06/22/2014   Procedure: TRANSESOPHAGEAL ECHOCARDIOGRAM (TEE);  Surgeon: [name], MD;  Location: [facility] ENDOSCOPY;  Service: Cardiovascular;  Laterality: N/A;  . TEE WITHOUT CARDIOVERSION N/A 05/17/2016   Procedure: TRANSESOPHAGEAL ECHOCARDIOGRAM (TEE);  Surgeon: Thayer Headings, MD;  Location: [place];  Service: Cardiovascular;  Laterality: N/A;  . TEE WITHOUT CARDIOVERSION N/A 09/20/2016   Procedure: TRANSESOPHAGEAL ECHOCARDIOGRAM (TEE);  Surgeon: [name], MD;  Location: [facility] ENDOSCOPY;  Service: Cardiovascular;  Laterality: N/A;  . TRANSURETHRAL RESECTION OF PROSTATE       reports that he has never smoked. He has never used smokeless tobacco. He reports that he does not drink alcohol or use drugs.  Allergies  Allergen Reactions  . Metoprolol Tartrate Other (See Comments)    Anxiety, heart rate goes too low, feels like in a fog.    Family History  Problem Relation Age of Onset  . Heart attack Mother   . Hypertension Other   . Heart disease Brother        CAD male 1st degree relative  . Stroke Neg Hx     Prior to Admission medications   Medication Sig Start Date End Date Taking? Authorizing Provider  clonazePAM (KLONOPIN) 1 MG tablet Take 1 tablet (1 mg total) by mouth at bedtime. 01/08/15  Yes Marletta Lor, MD  Coenzyme Q10 (CO Q-10 PO) Take 100 mg by mouth daily.   Yes [provider]  DIGESTIVE ENZYMES PO Take 1 tablet by mouth 2 (two) times daily.    Yes [provider]  diltiazem (CARDIZEM CD) 180 MG 24 hr capsule Take 1 capsule (180 mg total) by mouth daily. 11/15/16  Yes Camnitz, Will Hassell Done, MD  finasteride (PROSCAR) 5 MG tablet Take 5 mg by mouth daily. 12/22/14  Yes [provider]  fish oil-omega-3 fatty acids 1000 MG capsule Take 1 g by mouth 2 (two) times daily.    Yes [provider]  hydroxypropyl methylcellulose (ISOPTO TEARS) 2.5 % ophthalmic solution Place 1 drop  into both eyes 3 (three) times daily as needed for dry eyes.    Yes [provider]  levothyroxine (SYNTHROID, LEVOTHROID) 50 MCG tablet Take 50 mcg by mouth See admin instructions. On Tuesday thursdays and saturdays patient stats he takes a full tablet then take a half along with it on these days. 1 whole tablet rest of days   Yes [provider]  Multiple Vitamin (MULTIVITAMIN) capsule Take 1 capsule by mouth daily.   Yes [provider]  pantoprazole (PROTONIX) 40 MG tablet Take 40 mg by mouth 2 (two) times daily.    Yes [provider]  pravastatin (PRAVACHOL) 40 MG tablet Take 40 mg by mouth every evening. 03/07/11  Yes Imogene Burn, PA-C  traZODone (DESYREL) 50 MG tablet Take by mouth at bedtime as needed for sleep.    Yes [provider]  warfarin (COUMADIN) 5 MG tablet Take 5 mg by mouth daily at 6 PM. Take  5mg  by mouth daily on Monday and friday. Take 2.5mg  by mouth daily on Tuesday, Wednesday, Thursday, Saturday, and Sunday.   Yes [provider]  nitroGLYCERIN (NITROSTAT) 0.4 MG SL tablet Place 0.4 mg under the tongue every 5 (five) minutes as needed for chest pain.     [provider]  traMADol (ULTRAM) 50 MG tablet Take 1 tablet (50 mg total) by mouth every 8 (eight) hours as needed. Patient not taking: Reported on 01/05/2017 08/01/16   Briscoe Deutscher, DO  warfarin (COUMADIN) 5 MG tablet TAKE 1/2 TO 1 TABLET DAILY OR AS DIRECTED BY COUMADIN CLINIC Patient not taking: Reported on 01/05/2017 12/18/16   Minus Breeding, MD    Physical Exam: Vitals:   01/05/17 1528 01/05/17 1739  BP: 121/80 119/86  Pulse: 99 89  Resp: 16 14  Temp: 97.9 F (36.6 C)   TempSrc: Oral   SpO2: 98% 99%    Constitutional: NAD, calm, comfortable Eyes: PERRL, lids and conjunctivae normal ENMT: Mucous membranes are moist. Posterior pharynx clear of any exudate or lesions.Normal dentition.  Neck: normal, supple, no masses, no  thyromegaly Respiratory: clear to auscultation bilaterally, no wheezing, no crackles. Normal respiratory effort. No accessory muscle use.  Cardiovascular: Irregular rhythm, normal rate, no murmurs / rubs / gallops. No extremity edema. 2+ pedal pulses.  Abdomen: no tenderness, no masses palpated. No hepatosplenomegaly. Bowel sounds positive.  Musculoskeletal: no clubbing / cyanosis. +right ankle swollen and painful to palpation  Skin: no rashes, lesions, ulcers. No induration Neurologic: CN 2-12 grossly intact. Speech fluent, nonfocal exam  Psychiatric: Normal judgment and insight. Alert and oriented x 3. Normal mood.   Labs on Admission: I have personally reviewed following labs and imaging studies  CBC:  Recent Labs Lab 01/05/17 1637  WBC 6.5  NEUTROABS 4.6  HGB 12.5*  HCT 37.6*  MCV 96.2  PLT 144   Basic Metabolic Panel:  Recent Labs Lab 01/05/17 1637  NA 138  K 4.1  CL 106  CO2 25  GLUCOSE 115*  BUN 20  CREATININE 1.57*  CALCIUM 9.0   GFR: Estimated Creatinine Clearance: 36.4 mL/min (A) (by C-G formula based on SCr of 1.57 mg/dL (H)). Liver Function Tests:  Recent Labs Lab 01/05/17 1637  AST 29  ALT 20  ALKPHOS 83  BILITOT 0.5  PROT 6.8  ALBUMIN 3.8   No results for input(s): LIPASE, AMYLASE in the last 168 hours. No results for input(s): AMMONIA in the last 168 hours. Coagulation Profile:  Recent Labs Lab 01/05/17 1637  INR 2.17   Cardiac Enzymes: No results for input(s): CKTOTAL, CKMB, CKMBINDEX, TROPONINI in the last 168 hours. BNP (last 3 results) No results for input(s): PROBNP in the last 8760 hours. HbA1C: No results for input(s): HGBA1C in the last 72 hours. CBG: No results for input(s): GLUCAP in the last 168 hours. Lipid Profile: No results for input(s): CHOL, HDL, LDLCALC, TRIG, CHOLHDL, LDLDIRECT in the last 72 hours. Thyroid Function Tests: No results for input(s): TSH, T4TOTAL, FREET4, T3FREE, THYROIDAB in the last 72  hours. Anemia Panel: No results for input(s): VITAMINB12, FOLATE, FERRITIN, TIBC, IRON, RETICCTPCT in the last 72 hours. Urine analysis:    Component Value Date/Time   COLORURINE AMBER BIOCHEMICALS MAY BE AFFECTED BY COLOR (A) 11/10/2009 0122   APPEARANCEUR CLEAR 11/10/2009 0122   LABSPEC 1.024 11/10/2009 0122   PHURINE 6.5 11/10/2009 0122   GLUCOSEU NEGATIVE 11/10/2009 0122   HGBUR NEGATIVE 11/10/2009 0122   BILIRUBINUR SMALL (A) 11/10/2009 0122  KETONESUR 40 (A) 11/10/2009 0122   PROTEINUR 100 (A) 11/10/2009 0122   UROBILINOGEN 2.0 (H) 11/10/2009 0122   NITRITE NEGATIVE 11/10/2009 0122   LEUKOCYTESUR NEGATIVE 11/10/2009 0122   Sepsis Labs: !!!!!!!!!!!!!!!!!!!!!!!!!!!!!!!!!!!!!!!!!!!! @LABRCNTIP (procalcitonin:4,lacticidven:4) )No results found for this or any previous visit (from the past 240 hour(s)).   Radiological Exams on Admission: Dg Ankle Complete Right  Result Date: 01/05/2017 CLINICAL DATA:  Recent fall with ankle pain and swelling, initial encounter EXAM: RIGHT ANKLE - COMPLETE 3+ VIEW COMPARISON:  None. FINDINGS: Transverse fractures are noted through the lateral and medial malleolus with some lateral displacement of the distal fracture fragments and lateral displacement of the talus with respect to the distal tibia. No posterior malleolar fracture is seen. The talus and calcaneus appear within normal limits. IMPRESSION: Distal tibial and fibular fractures with lateral displacement of the distal fracture fragments as well as the talus with respect to the more proximal tibia. Electronically Signed   By: Inez Catalina M.D.   On: 01/05/2017 16:05    EKG: Independently reviewed. Atrial fibrillation without significant ST-T changes.   Assessment/Plan Active Problems:   Ankle fracture, right   Right distal tib/fib fracture with lateral displacement -Per ortho -Pain control -Lyndel Safe peri-op risk: 1.72% estimated risk probability for peri-op MI or cardiac arrest   Chronic A  Fib -CHADSVASC 5 -Hold coumadin pending surgical procedure -IV heparin until surgery, timing of surgery unclear  -Continue Cardizem. Had been on amiodarone in the past, failed cardioversion  -Tele  AKI on CKD stage 3 -Baseline Cr 1.2-1.3 -IVF   BPH -Continue Proscar  Hypothyroidism -Continue Synthroid  Hyperlipidemia -Continue Pravachol  GERD -Continue PPI  Anxiety -Continue Klonopin, trazodone  DVT prophylaxis: heparin IV, off coumadin for surgery Code Status: Full  Family Communication: wife at bedside Disposition Plan: pending surgical evaluation Consults called: orthopedic surgery  Admission status: inpatient    Severity of Illness: The appropriate patient status for this patient is INPATIENT. Inpatient status is judged to be reasonable and necessary in order to provide the required intensity of service to ensure the patient's safety. The patient's presenting symptoms, physical exam findings, and initial radiographic and laboratory data in the context of their chronic comorbidities is felt to place them at high risk for further clinical deterioration. Furthermore, it is not anticipated that the patient will be medically stable for discharge from the hospital within 2 midnights of admission. The following factors support the patient status of inpatient.   " The patient's presenting symptoms include right ankle pain. " The worrisome physical exam findings include right ankle pain. " The initial radiographic and laboratory data are worrisome because of right ankle fracture " The chronic co-morbidities include CAD, A Fib, on coumadin  * I certify that at the point of admission it is my clinical judgment that the patient will require inpatient hospital care spanning beyond 2 midnights from the point of admission due to high intensity of service, high risk for further deterioration and high frequency of surveillance required.*   Dessa Phi, DO [facility] [email or web address] Password [facility] 01/05/2017, 6:30 PM

## 2017-01-05 NOTE — Progress Notes (Signed)
ANTICOAGULATION CONSULT NOTE - Initial Consult  Pharmacy Consult for Heparin Indication: atrial fibrillation  Allergies  Allergen Reactions  . Metoprolol Tartrate Other (See Comments)    Anxiety, heart rate goes too low, feels like in a fog.    Patient Measurements:    Actual Weight: 84.4 kg Ideal body weight: 77.6 kg (171 lb 1.2 oz) Adjusted ideal body weight: 80.3 kg (177 lb 0.7 oz)  Heparin Dosing Weight: 84 kg  Vital Signs: Temp: 98.3 F (36.8 C) (06/22 2023) Temp Source: Oral (06/22 2023) BP: 141/89 (06/22 2023) Pulse Rate: 101 (06/22 2023)  Labs:  Recent Labs  01/05/17 1637  HGB 12.5*  HCT 37.6*  PLT 187  LABPROT 24.5*  INR 2.17  CREATININE 1.57*    Estimated Creatinine Clearance: 36.4 mL/min (A) (by C-G formula based on SCr of 1.57 mg/dL (H)).   Medical History: Past Medical History:  Diagnosis Date  . Anemia   . Barrett's esophagus   . BENIGN PROSTATIC HYPERTROPHY   . Chronic anticoagulation    a. on Coumadin  . CORONARY ARTERY DISEASE    a. s/p stent OM 2002. b. s/p BMS 2006 RCA  c. s/p cath 2007.  [name] DEPRESSION   . DIVERTICULOSIS, COLON   . GERD   . GYNECOMASTIA, UNILATERAL   . Hematoma of abdominal wall   . History of echocardiogram    a. EF 55% (TEE 06/2014)  . HYPERLIPIDEMIA   . HYPERTENSION   . HYPOTHYROIDISM   . Mallory - Weiss tear    a. > 5 years ago  . Mitral regurgitation    a. Mild-mod by TEE 06/2014.  [name] Paroxysmal A-fib (HCC)    a. chronic amiodarone and coumadin;  b. 05/2013 s/p DCCV. c. 06/2014 s/p TEE/DCCV.  [name] Popliteal artery embolism, right ([place])   . SLEEP APNEA    CPAP    Assessment: 35 YOM who presented on 6/22 s/p fall with distal tibial and fibular fractures. Ortho consulted and planning surgery. The patient was on warfarin PTA for hx Afib with admit INR 2.17 - held for surgery plans. Pharmacy consulted to bridge with Heparin - will plan to start when INR<2.  Goal of Therapy:  INR 2-3 Monitor platelets by  anticoagulation protocol: Yes   Plan:  1. Hold Heparin for now 2. Will check INR in the AM and plan to start Heparin drip when INR<2  Thank you for allowing pharmacy to be a part of this patient's care.  Alycia Rossetti, PharmD, BCPS Clinical Pharmacist Pager: [phone] 01/05/2017 8:59 PM

## 2017-01-06 ENCOUNTER — Inpatient Hospital Stay (HOSPITAL_COMMUNITY): Payer: Medicare Other | Admitting: Certified Registered Nurse Anesthetist

## 2017-01-06 ENCOUNTER — Inpatient Hospital Stay (HOSPITAL_COMMUNITY): Payer: Medicare Other

## 2017-01-06 ENCOUNTER — Encounter (HOSPITAL_COMMUNITY): Payer: Self-pay | Admitting: Certified Registered Nurse Anesthetist

## 2017-01-06 ENCOUNTER — Encounter (HOSPITAL_COMMUNITY)
Admission: EM | Disposition: A | Discharge status: Skilled Nursing Facility | Payer: Self-pay | Source: Home / Self Care | Attending: Internal Medicine

## 2017-01-06 DIAGNOSIS — S82891A Other fracture of right lower leg, initial encounter for closed fracture: Secondary | ICD-10-CM

## 2017-01-06 DIAGNOSIS — S82201A Unspecified fracture of shaft of right tibia, initial encounter for closed fracture: Secondary | ICD-10-CM

## 2017-01-06 DIAGNOSIS — S82841A Displaced bimalleolar fracture of right lower leg, initial encounter for closed fracture: Secondary | ICD-10-CM

## 2017-01-06 DIAGNOSIS — S82401A Unspecified fracture of shaft of right fibula, initial encounter for closed fracture: Secondary | ICD-10-CM

## 2017-01-06 HISTORY — PX: ORIF ANKLE FRACTURE: SHX5408

## 2017-01-06 LAB — PROTIME-INR
INR: 2.19
INR: 2.2
PROTHROMBIN TIME: 24.8 s — AB (ref 11.4–15.2)
Prothrombin Time: 24.7 seconds — ABNORMAL HIGH (ref 11.4–15.2)

## 2017-01-06 LAB — CBC
HEMATOCRIT: 36.3 % — AB (ref 39.0–52.0)
HEMOGLOBIN: 11.8 g/dL — AB (ref 13.0–17.0)
MCH: 31.6 pg (ref 26.0–34.0)
MCHC: 32.5 g/dL (ref 30.0–36.0)
MCV: 97.1 fL (ref 78.0–100.0)
Platelets: 175 10*3/uL (ref 150–400)
RBC: 3.74 MIL/uL — AB (ref 4.22–5.81)
RDW: 14 % (ref 11.5–15.5)
WBC: 7.4 10*3/uL (ref 4.0–10.5)

## 2017-01-06 LAB — BASIC METABOLIC PANEL
ANION GAP: 6 (ref 5–15)
BUN: 16 mg/dL (ref 6–20)
CALCIUM: 8.6 mg/dL — AB (ref 8.9–10.3)
CO2: 25 mmol/L (ref 22–32)
Chloride: 106 mmol/L (ref 101–111)
Creatinine, Ser: 1.23 mg/dL (ref 0.61–1.24)
GFR calc non Af Amer: 51 mL/min — ABNORMAL LOW (ref >60)
GFR, EST AFRICAN AMERICAN: 59 mL/min — AB (ref >60)
Glucose, Bld: 97 mg/dL (ref 65–99)
POTASSIUM: 4 mmol/L (ref 3.5–5.1)
Sodium: 137 mmol/L (ref 135–145)

## 2017-01-06 LAB — MRSA PCR SCREENING: MRSA by PCR: NEGATIVE

## 2017-01-06 SURGERY — OPEN REDUCTION INTERNAL FIXATION (ORIF) ANKLE FRACTURE
Anesthesia: General | Site: Leg Lower | Laterality: Right

## 2017-01-06 MED ORDER — PHENYLEPHRINE HCL 10 MG/ML IJ SOLN
INTRAMUSCULAR | Status: DC | PRN
  Administered 2017-01-06 (×4): 80 ug via INTRAVENOUS
  Administered 2017-01-06: 120 ug via INTRAVENOUS
  Administered 2017-01-06: 80 ug via INTRAVENOUS

## 2017-01-06 MED ORDER — WARFARIN SODIUM 5 MG PO TABS
5.0000 mg | ORAL_TABLET | Freq: Once | ORAL | Status: AC
  Administered 2017-01-06: 5 mg via ORAL
  Filled 2017-01-06: qty 1

## 2017-01-06 MED ORDER — CEFAZOLIN SODIUM-DEXTROSE 2-4 GM/100ML-% IV SOLN
2.0000 g | Freq: Once | INTRAVENOUS | Status: AC
  Administered 2017-01-06: 2 g via INTRAVENOUS
  Filled 2017-01-06: qty 100

## 2017-01-06 MED ORDER — PHENYLEPHRINE 40 MCG/ML (10ML) SYRINGE FOR IV PUSH (FOR BLOOD PRESSURE SUPPORT)
PREFILLED_SYRINGE | INTRAVENOUS | Status: AC
  Filled 2017-01-06: qty 10

## 2017-01-06 MED ORDER — 0.9 % SODIUM CHLORIDE (POUR BTL) OPTIME
TOPICAL | Status: DC | PRN
  Administered 2017-01-06: 1000 mL

## 2017-01-06 MED ORDER — FENTANYL CITRATE (PF) 100 MCG/2ML IJ SOLN
INTRAMUSCULAR | Status: AC
  Administered 2017-01-06: 100 ug via INTRAVENOUS
  Filled 2017-01-06: qty 2

## 2017-01-06 MED ORDER — PROPOFOL 10 MG/ML IV BOLUS
INTRAVENOUS | Status: DC | PRN
  Administered 2017-01-06: 100 mg via INTRAVENOUS

## 2017-01-06 MED ORDER — MIDAZOLAM HCL 2 MG/2ML IJ SOLN
INTRAMUSCULAR | Status: AC
  Filled 2017-01-06: qty 2

## 2017-01-06 MED ORDER — FENTANYL CITRATE (PF) 100 MCG/2ML IJ SOLN
100.0000 ug | Freq: Once | INTRAMUSCULAR | Status: AC
  Administered 2017-01-06: 100 ug via INTRAVENOUS

## 2017-01-06 MED ORDER — WARFARIN SODIUM 5 MG PO TABS
5.0000 mg | ORAL_TABLET | Freq: Once | ORAL | Status: AC
  Filled 2017-01-06: qty 1

## 2017-01-06 MED ORDER — FENTANYL CITRATE (PF) 100 MCG/2ML IJ SOLN
INTRAMUSCULAR | Status: DC | PRN
  Administered 2017-01-06: 25 ug via INTRAVENOUS

## 2017-01-06 MED ORDER — PRAVASTATIN SODIUM 40 MG PO TABS
40.0000 mg | ORAL_TABLET | Freq: Every evening | ORAL | Status: DC
  Administered 2017-01-06 – 2017-01-07 (×2): 40 mg via ORAL
  Filled 2017-01-06 (×2): qty 1

## 2017-01-06 MED ORDER — OXYCODONE HCL 5 MG PO TABS
5.0000 mg | ORAL_TABLET | ORAL | Status: DC | PRN
  Administered 2017-01-08: 5 mg via ORAL
  Filled 2017-01-06 (×2): qty 1

## 2017-01-06 MED ORDER — EPHEDRINE SULFATE 50 MG/ML IJ SOLN
INTRAMUSCULAR | Status: DC | PRN
  Administered 2017-01-06: 10 mg via INTRAVENOUS
  Administered 2017-01-06: 5 mg via INTRAVENOUS

## 2017-01-06 MED ORDER — LIDOCAINE HCL (CARDIAC) 20 MG/ML IV SOLN
INTRAVENOUS | Status: DC | PRN
  Administered 2017-01-06: 50 mg via INTRAVENOUS

## 2017-01-06 MED ORDER — WARFARIN - PHARMACIST DOSING INPATIENT: Freq: Every day | Status: DC

## 2017-01-06 MED ORDER — BUPIVACAINE-EPINEPHRINE (PF) 0.5% -1:200000 IJ SOLN
INTRAMUSCULAR | Status: DC | PRN
  Administered 2017-01-06: 30 mL via PERINEURAL
  Administered 2017-01-06: 15 mL via PERINEURAL

## 2017-01-06 MED ORDER — DEXAMETHASONE SODIUM PHOSPHATE 10 MG/ML IJ SOLN
INTRAMUSCULAR | Status: DC | PRN
  Administered 2017-01-06: 4 mg via INTRAVENOUS

## 2017-01-06 MED ORDER — DEXAMETHASONE SODIUM PHOSPHATE 10 MG/ML IJ SOLN
INTRAMUSCULAR | Status: AC
  Filled 2017-01-06: qty 1

## 2017-01-06 MED ORDER — ONDANSETRON HCL 4 MG/2ML IJ SOLN
INTRAMUSCULAR | Status: DC | PRN
  Administered 2017-01-06: 4 mg via INTRAVENOUS

## 2017-01-06 MED ORDER — LACTATED RINGERS IV SOLN
INTRAVENOUS | Status: DC
  Administered 2017-01-06 (×2): via INTRAVENOUS

## 2017-01-06 MED ORDER — FENTANYL CITRATE (PF) 100 MCG/2ML IJ SOLN: 25.0000 ug | INTRAMUSCULAR | Status: DC | PRN

## 2017-01-06 MED ORDER — FENTANYL CITRATE (PF) 250 MCG/5ML IJ SOLN
INTRAMUSCULAR | Status: AC
  Filled 2017-01-06: qty 5

## 2017-01-06 MED ORDER — ONDANSETRON HCL 4 MG/2ML IJ SOLN
INTRAMUSCULAR | Status: AC
  Filled 2017-01-06: qty 2

## 2017-01-06 SURGICAL SUPPLY — 70 items
BANDAGE ACE 4X5 VEL STRL LF (GAUZE/BANDAGES/DRESSINGS) IMPLANT
BANDAGE ACE 6X5 VEL STRL LF (GAUZE/BANDAGES/DRESSINGS) IMPLANT
BANDAGE ELASTIC 3 VELCRO ST LF (GAUZE/BANDAGES/DRESSINGS) ×3 IMPLANT
BANDAGE ELASTIC 6 VELCRO ST LF (GAUZE/BANDAGES/DRESSINGS) ×3 IMPLANT
BANDAGE ESMARK 6X9 LF (GAUZE/BANDAGES/DRESSINGS) IMPLANT
BIT DRILL 3.5X122MM AO FIT (BIT) ×3 IMPLANT
BIT DRILL CANN 2.7 (BIT) ×1
BIT DRILL CANN 2.7MM (BIT) ×1
BIT DRILL SRG 2.7XCANN AO CPLG (BIT) ×1 IMPLANT
BIT DRL SRG 2.7XCANN AO CPLNG (BIT) ×1
BNDG ESMARK 6X9 LF (GAUZE/BANDAGES/DRESSINGS)
COVER SURGICAL LIGHT HANDLE (MISCELLANEOUS) ×3 IMPLANT
CUFF TOURNIQUET SINGLE 34IN LL (TOURNIQUET CUFF) IMPLANT
CUFF TOURNIQUET SINGLE 44IN (TOURNIQUET CUFF) IMPLANT
DRAPE C-ARM 42X72 X-RAY (DRAPES) ×3 IMPLANT
DRAPE U-SHAPE 47X51 STRL (DRAPES) ×3 IMPLANT
DRILL 2.6X122MM WL AO SHAFT (BIT) ×3 IMPLANT
DURAPREP 26ML APPLICATOR (WOUND CARE) ×3 IMPLANT
ELECT REM PT RETURN 9FT ADLT (ELECTROSURGICAL) ×3
ELECTRODE REM PT RTRN 9FT ADLT (ELECTROSURGICAL) ×1 IMPLANT
GAUZE SPONGE 4X4 12PLY STRL (GAUZE/BANDAGES/DRESSINGS) ×3 IMPLANT
GAUZE XEROFORM 1X8 LF (GAUZE/BANDAGES/DRESSINGS) ×3 IMPLANT
GAUZE XEROFORM 5X9 LF (GAUZE/BANDAGES/DRESSINGS) ×3 IMPLANT
GLOVE BIO SURGEON STRL SZ8 (GLOVE) ×3 IMPLANT
GLOVE ORTHO TXT STRL SZ7.5 (GLOVE) ×3 IMPLANT
GOWN STRL REUS W/ TWL LRG LVL3 (GOWN DISPOSABLE) ×2 IMPLANT
GOWN STRL REUS W/ TWL XL LVL3 (GOWN DISPOSABLE) ×4 IMPLANT
GOWN STRL REUS W/TWL LRG LVL3 (GOWN DISPOSABLE) ×4
GOWN STRL REUS W/TWL XL LVL3 (GOWN DISPOSABLE) ×8
K-WIRE ORTHOPEDIC 1.4X150L (WIRE) ×6
KIT BASIN OR (CUSTOM PROCEDURE TRAY) ×3 IMPLANT
KIT ROOM TURNOVER OR (KITS) ×3 IMPLANT
KWIRE ORTHOPEDIC 1.4X150L (WIRE) ×2 IMPLANT
MANIFOLD NEPTUNE II (INSTRUMENTS) ×3 IMPLANT
NEEDLE HYPO 25GX1X1/2 BEV (NEEDLE) IMPLANT
NS IRRIG 1000ML POUR BTL (IV SOLUTION) ×3 IMPLANT
PACK ORTHO EXTREMITY (CUSTOM PROCEDURE TRAY) ×3 IMPLANT
PAD ABD 8X10 STRL (GAUZE/BANDAGES/DRESSINGS) ×3 IMPLANT
PAD ARMBOARD 7.5X6 YLW CONV (MISCELLANEOUS) ×6 IMPLANT
PAD CAST 3X4 CTTN HI CHSV (CAST SUPPLIES) ×1 IMPLANT
PAD CAST 4YDX4 CTTN HI CHSV (CAST SUPPLIES) ×1 IMPLANT
PADDING CAST COTTON 3X4 STRL (CAST SUPPLIES) ×2
PADDING CAST COTTON 4X4 STRL (CAST SUPPLIES) ×2
PADDING CAST COTTON 6X4 STRL (CAST SUPPLIES) ×3 IMPLANT
PLATE DISTAL FIBULA 3HOLE (Plate) ×3 IMPLANT
PREFILTER NEPTUNE (MISCELLANEOUS) ×3 IMPLANT
SCREW 44X4.0MM (Screw) ×3 IMPLANT
SCREW 50X4.0MM (Screw) ×3 IMPLANT
SCREW BONE 14MMX3.5MM (Screw) ×6 IMPLANT
SCREW BONE 3.5X16MM (Screw) ×3 IMPLANT
SCREW BONE ANKLE 3.5X14MM (Screw) ×3 IMPLANT
SCREW LOCKING 3.5X16MM (Screw) ×6 IMPLANT
SPLINT PLASTER CAST XFAST 5X30 (CAST SUPPLIES) ×1 IMPLANT
SPLINT PLASTER XFAST SET 5X30 (CAST SUPPLIES) ×2
SPONGE LAP 4X18 X RAY DECT (DISPOSABLE) ×6 IMPLANT
SUCTION FRAZIER HANDLE 10FR (MISCELLANEOUS) ×2
SUCTION TUBE FRAZIER 10FR DISP (MISCELLANEOUS) ×1 IMPLANT
SUT ETHILON 2 0 FS 18 (SUTURE) ×15 IMPLANT
SUT VIC AB 0 CT1 27 (SUTURE) ×4
SUT VIC AB 0 CT1 27XBRD ANBCTR (SUTURE) ×2 IMPLANT
SUT VIC AB 2-0 CT1 27 (SUTURE) ×4
SUT VIC AB 2-0 CT1 27XBRD (SUTURE) ×1 IMPLANT
SUT VIC AB 2-0 CT1 TAPERPNT 27 (SUTURE) ×1 IMPLANT
SYR CONTROL 10ML LL (SYRINGE) IMPLANT
TOWEL OR 17X24 6PK STRL BLUE (TOWEL DISPOSABLE) ×3 IMPLANT
TOWEL OR 17X26 10 PK STRL BLUE (TOWEL DISPOSABLE) ×3 IMPLANT
TUBE CONNECTING 12'X1/4 (SUCTIONS) ×1
TUBE CONNECTING 12X1/4 (SUCTIONS) ×2 IMPLANT
WATER STERILE IRR 1000ML POUR (IV SOLUTION) ×3 IMPLANT
WIRE K 1.6MM 144256 (MISCELLANEOUS) ×6 IMPLANT

## 2017-01-06 NOTE — Anesthesia Postprocedure Evaluation (Signed)
Anesthesia Post Note  Patient: [name]  Procedure(s) Performed: Procedure(s) (LRB): OPEN REDUCTION INTERNAL FIXATION (ORIF) ANKLE FRACTURE, RIGHT (Right)     Patient location during evaluation: PACU Anesthesia Type: General and Regional Level of consciousness: awake and alert Pain management: pain level controlled Vital Signs Assessment: post-procedure vital signs reviewed and stable Respiratory status: spontaneous breathing, nonlabored ventilation, respiratory function stable and patient connected to nasal cannula oxygen Cardiovascular status: blood pressure returned to baseline and stable Postop Assessment: no signs of nausea or vomiting Anesthetic complications: no    Last Vitals:  Vitals:   01/06/17 1247 01/06/17 1255  BP: 111/71   Pulse: 99   Resp: 10   Temp:  36.2 C    Last Pain:  Vitals:   01/06/17 1255  TempSrc:   PainSc: 0-No pain                 Leani Myron,W. EDMOND

## 2017-01-06 NOTE — Progress Notes (Addendum)
[facility] PROGRESS NOTE  [name] ZSW:[id number] DOB: Sep 08, 1926 DOA: 01/05/2017   PCP: Marletta Lor, MD     Assessment/Plan: Principal Problem:   Tibia/fibula fracture, right, closed, initial encounter   83 y.o. male with medical history significant of chronic atrial fibrillation on Coumadin, CAD status post stent placement, hypertension, hyperlipidemia, hypothyroidism, sleep apnea on CPAP who presents after a fall. Right ankle x-ray completed which showed distal tibial and fibular fractures. Orthopedic surgery was consulted  Assessment and plan Right distal tib/fib fracture with lateral displacement  Dr. Ninfa Linden to perform surgery 6/23,s/p OPEN REDUCTION INTERNAL FIXATION (ORIF)  -Pain control -Lyndel Safe peri-op risk: 1.72% estimated risk probability for peri-op MI or cardiac arrest   Chronic A Fib -CHADSVASC 5 -Hold coumadin pending surgical procedure.given vit k 10mg   6/22 -IV heparin  when INR less than 2.0. Resume Coumadin after surgery -Continue Cardizem. Had been on amiodarone in the past, failed cardioversion  Continue telemetry  AKI on CKD stage 3 -Baseline Cr 1.2-1.3 -IVF   BPH -Continue Proscar  Hypothyroidism -Continue Synthroid  Hyperlipidemia -Continue Pravachol  GERD -Continue PPI  Anxiety -Continue Klonopin, trazodone  Anemia Baseline hemoglobin around 12, follow-up postop CBC     DVT prophylaxsis Coumadin/heparin  Code Status:  Full code     Family Communication: Discussed in detail with the patient, all imaging results, lab results explained to the patient   Disposition Plan:   2-3 days      Consultants:  Orthopedics  Procedures:  None  Antibiotics: Anti-infectives    None         HPI/Subjective:  extreme pain when he tries to move his foot  Objective: Vitals:   01/05/17 1841 01/05/17 1914 01/05/17 2023 01/06/17 0511  BP: 134/81 (!) 127/92 (!) 141/89 117/68  Pulse: 94 (!) 105  (!) 101 78  Resp: 16 16 18 18   Temp:   98.3 F (36.8 C) 97.6 F (36.4 C)  TempSrc:   Oral Oral  SpO2: 97% 97% 100% 96%    Intake/Output Summary (Last 24 hours) at 01/06/17 0828 Last data filed at 01/06/17 3220  Gross per 24 hour  Intake                0 ml  Output              200 ml  Net             -200 ml    Exam:  Examination:  General exam: Appears calm and comfortable  Respiratory system: Clear to auscultation. Respiratory effort normal. Cardiovascular system: S1 & S2 heard, RRR. No JVD, murmurs, rubs, gallops or clicks. No pedal edema. Gastrointestinal system: Abdomen is nondistended, soft and nontender. No organomegaly or masses felt. Normal bowel sounds heard. Central nervous system: Alert and oriented. No focal neurological deficits. Extremities:  Deformity of the right ankle with tenderness along the lateral malleolus Skin: No rashes, lesions or ulcers Psychiatry: Judgement and insight appear normal. Mood & affect appropriate.     Data Reviewed: I have personally reviewed following labs and imaging studies  Micro Results Recent Results (from the past 240 hour(s))  MRSA PCR Screening     Status: None   Collection Time: 01/05/17 11:51 PM  Result Value Ref Range Status   MRSA by PCR NEGATIVE NEGATIVE Final    Comment:        The GeneXpert MRSA Assay (FDA approved for NASAL specimens only), is one component of  a comprehensive MRSA colonization surveillance program. It is not intended to diagnose MRSA infection nor to guide or monitor treatment for MRSA infections.     Radiology Reports Dg Ankle Complete Right  Result Date: 01/05/2017 CLINICAL DATA:  Recent fall with ankle pain and swelling, initial encounter EXAM: RIGHT ANKLE - COMPLETE 3+ VIEW COMPARISON:  None. FINDINGS: Transverse fractures are noted through the lateral and medial malleolus with some lateral displacement of the distal fracture fragments and lateral displacement of the talus with  respect to the distal tibia. No posterior malleolar fracture is seen. The talus and calcaneus appear within normal limits. IMPRESSION: Distal tibial and fibular fractures with lateral displacement of the distal fracture fragments as well as the talus with respect to the more proximal tibia. Electronically Signed   By: Inez Catalina M.D.   On: 01/05/2017 16:05     CBC  Recent Labs Lab 01/05/17 1637 01/06/17 0448  WBC 6.5 7.4  HGB 12.5* 11.8*  HCT 37.6* 36.3*  PLT 187 175  MCV 96.2 97.1  MCH 32.0 31.6  MCHC 33.2 32.5  RDW 14.2 14.0  LYMPHSABS 0.9  --   MONOABS 0.8  --   EOSABS 0.1  --   BASOSABS 0.0  --     Chemistries   Recent Labs Lab 01/05/17 1637 01/06/17 0448  NA 138 137  K 4.1 4.0  CL 106 106  CO2 25 25  GLUCOSE 115* 97  BUN 20 16  CREATININE 1.57* 1.23  CALCIUM 9.0 8.6*  AST 29  --   ALT 20  --   ALKPHOS 83  --   BILITOT 0.5  --    ------------------------------------------------------------------------------------------------------------------ estimated creatinine clearance is 46.4 mL/min (by C-G formula based on SCr of 1.23 mg/dL). ------------------------------------------------------------------------------------------------------------------ No results for input(s): HGBA1C in the last 72 hours. ------------------------------------------------------------------------------------------------------------------ No results for input(s): CHOL, HDL, LDLCALC, TRIG, CHOLHDL, LDLDIRECT in the last 72 hours. ------------------------------------------------------------------------------------------------------------------ No results for input(s): TSH, T4TOTAL, T3FREE, THYROIDAB in the last 72 hours.  Invalid input(s): FREET3 ------------------------------------------------------------------------------------------------------------------ No results for input(s): VITAMINB12, FOLATE, FERRITIN, TIBC, IRON, RETICCTPCT in the last 72 hours.  Coagulation  profile  Recent Labs Lab 01/05/17 1637 01/06/17 0448  INR 2.17 2.19    No results for input(s): DDIMER in the last 72 hours.  Cardiac Enzymes No results for input(s): CKMB, TROPONINI, MYOGLOBIN in the last 168 hours.  Invalid input(s): CK ------------------------------------------------------------------------------------------------------------------ Invalid input(s): POCBNP   CBG: No results for input(s): GLUCAP in the last 168 hours.     Studies: Dg Ankle Complete Right  Result Date: 01/05/2017 CLINICAL DATA:  Recent fall with ankle pain and swelling, initial encounter EXAM: RIGHT ANKLE - COMPLETE 3+ VIEW COMPARISON:  None. FINDINGS: Transverse fractures are noted through the lateral and medial malleolus with some lateral displacement of the distal fracture fragments and lateral displacement of the talus with respect to the distal tibia. No posterior malleolar fracture is seen. The talus and calcaneus appear within normal limits. IMPRESSION: Distal tibial and fibular fractures with lateral displacement of the distal fracture fragments as well as the talus with respect to the more proximal tibia. Electronically Signed   By: Inez Catalina M.D.   On: 01/05/2017 16:05      No results found for: HGBA1C Lab Results  Component Value Date   LDLCALC 63 10/07/2008   CREATININE 1.23 01/06/2017       Scheduled Meds: . clonazePAM  1 mg Oral QHS  . diltiazem  180 mg Oral Daily  .  docusate sodium  100 mg Oral BID  . finasteride  5 mg Oral Daily  . levothyroxine  75 mcg Oral Once per day on Tue Thu Sat   And  . [START ON 01/07/2017] levothyroxine  50 mcg Oral Once per day on Sun Mon Wed Fri  . pantoprazole  40 mg Oral BID  . pravastatin  40 mg Oral QPM  . sodium chloride flush  3 mL Intravenous Q12H   Continuous Infusions:   LOS: 1 day    Time spent: >30 MINS    Reyne Dumas  [facility] Pager [phone]. If 7PM-7AM, please contact night-coverage at  [email or web address], password [email or web address] 01/06/2017, 8:28 AM  LOS: 1 day

## 2017-01-06 NOTE — Consult Note (Signed)
Reason for Consult:  Right unstable ankle fracture Referring Physician: Levora Dredge, MD  [name] is an 83 y.o. male.  HPI:   83 yo male who injured his right ankle after an accidental mechanical fall yesterday while doing yard work.  Was seen at the ED and found to have an unstable right bimalleolar ankle fracture.  We was graciously admitted to the Medical Service.  He does report quite significant right ankle pain and he is currently in a splint.  Past Medical History:  Diagnosis Date  . Anemia   . Barrett's esophagus   . BENIGN PROSTATIC HYPERTROPHY   . Chronic anticoagulation    a. on Coumadin  . CORONARY ARTERY DISEASE    a. s/p stent OM 2002. b. s/p BMS 2006 RCA  c. s/p cath 2007.  [name] DEPRESSION   . DIVERTICULOSIS, COLON   . GERD   . GYNECOMASTIA, UNILATERAL   . Hematoma of abdominal wall   . History of echocardiogram    a. EF 55% (TEE 06/2014)  . HYPERLIPIDEMIA   . HYPERTENSION   . HYPOTHYROIDISM   . Mallory - Weiss tear    a. > 5 years ago  . Mitral regurgitation    a. Mild-mod by TEE 06/2014.  [name] Paroxysmal A-fib (HCC)    a. chronic amiodarone and coumadin;  b. 05/2013 s/p DCCV. c. 06/2014 s/p TEE/DCCV.  [name] Popliteal artery embolism, right ([place])   . SLEEP APNEA    CPAP    Past Surgical History:  Procedure Laterality Date  . cardioversion  8/08  . CARDIOVERSION  09/14/2011   Procedure: CARDIOVERSION;  Surgeon: Josue Hector, MD;  Location: [place];  Service: Cardiovascular;  Laterality: N/A;  . CARDIOVERSION N/A 03/23/2014   Procedure: CARDIOVERSION;  Surgeon: Thompson Grayer, MD;  Location: [place];  Service: Cardiovascular;  Laterality: N/A;  . CARDIOVERSION N/A 06/22/2014   Procedure: CARDIOVERSION;  Surgeon: [name], MD;  Location: [facility] ENDOSCOPY;  Service: Cardiovascular;  Laterality: N/A;  . CARDIOVERSION N/A 04/27/2015   Procedure: CARDIOVERSION;  Surgeon: Josue Hector, MD;  Location: [facility] ENDOSCOPY;  Service: Cardiovascular;  Laterality: N/A;  .  CARDIOVERSION N/A 07/16/2015   Procedure: CARDIOVERSION;  Surgeon: [name], MD;  Location: [place];  Service: Cardiovascular;  Laterality: N/A;  . CARDIOVERSION N/A 11/26/2015   Procedure: CARDIOVERSION;  Surgeon: Sanda Klein, MD;  Location: [facility] ENDOSCOPY;  Service: Cardiovascular;  Laterality: N/A;  . CARDIOVERSION N/A 05/17/2016   Procedure: CARDIOVERSION;  Surgeon: Thayer Headings, MD;  Location: [place];  Service: Cardiovascular;  Laterality: N/A;  . CARDIOVERSION N/A 09/20/2016   Procedure: CARDIOVERSION;  Surgeon: [name], MD;  Location: [facility] ENDOSCOPY;  Service: Cardiovascular;  Laterality: N/A;  . CARDIOVERSION N/A 11/09/2016   Procedure: CARDIOVERSION;  Surgeon: [name], MD;  Location: [facility] ENDOSCOPY;  Service: Cardiovascular;  Laterality: N/A;  . CORONARY ANGIOPLASTY WITH STENT PLACEMENT    . ESOPHAGOGASTRODUODENOSCOPY    . TEE WITH CARDIOVERSION  7/08  . TEE WITHOUT CARDIOVERSION  09/14/2011   Procedure: TRANSESOPHAGEAL ECHOCARDIOGRAM (TEE);  Surgeon: Josue Hector, MD;  Location: [facility] ENDOSCOPY;  Service: Cardiovascular;  Laterality: N/A;  . TEE WITHOUT CARDIOVERSION N/A 06/22/2014   Procedure: TRANSESOPHAGEAL ECHOCARDIOGRAM (TEE);  Surgeon: [name], MD;  Location: [facility] ENDOSCOPY;  Service: Cardiovascular;  Laterality: N/A;  . TEE WITHOUT CARDIOVERSION N/A 05/17/2016   Procedure: TRANSESOPHAGEAL ECHOCARDIOGRAM (TEE);  Surgeon: Thayer Headings, MD;  Location: [place];  Service: Cardiovascular;  Laterality: N/A;  .  TEE WITHOUT CARDIOVERSION N/A 09/20/2016   Procedure: TRANSESOPHAGEAL ECHOCARDIOGRAM (TEE);  Surgeon: [name], MD;  Location: [facility] ENDOSCOPY;  Service: Cardiovascular;  Laterality: N/A;  . TRANSURETHRAL RESECTION OF PROSTATE      Family History  Problem Relation Age of Onset  . Heart attack Mother   . Hypertension Other   . Heart disease Brother        CAD male 1st degree relative  . Stroke Neg Hx     Social History:   reports that he has never smoked. He has never used smokeless tobacco. He reports that he does not drink alcohol or use drugs.  Allergies:  Allergies  Allergen Reactions  . Metoprolol Tartrate Other (See Comments)    Anxiety, heart rate goes too low, feels like in a fog.    Medications: I have reviewed the patient's current medications.  Results for orders placed or performed during the hospital encounter of 01/05/17 (from the past 48 hour(s))  CBC with Differential/Platelet     Status: Abnormal   Collection Time: 01/05/17  4:37 PM  Result Value Ref Range   WBC 6.5 4.0 - 10.5 K/uL   RBC 3.91 (L) 4.22 - 5.81 MIL/uL   Hemoglobin 12.5 (L) 13.0 - 17.0 g/dL   HCT 37.6 (L) 39.0 - 52.0 %   MCV 96.2 78.0 - 100.0 fL   MCH 32.0 26.0 - 34.0 pg   MCHC 33.2 30.0 - 36.0 g/dL   RDW 14.2 11.5 - 15.5 %   Platelets 187 150 - 400 K/uL   Neutrophils Relative % 71 %   Neutro Abs 4.6 1.7 - 7.7 K/uL   Lymphocytes Relative 14 %   Lymphs Abs 0.9 0.7 - 4.0 K/uL   Monocytes Relative 12 %   Monocytes Absolute 0.8 0.1 - 1.0 K/uL   Eosinophils Relative 2 %   Eosinophils Absolute 0.1 0.0 - 0.7 K/uL   Basophils Relative 1 %   Basophils Absolute 0.0 0.0 - 0.1 K/uL  Comprehensive metabolic panel     Status: Abnormal   Collection Time: 01/05/17  4:37 PM  Result Value Ref Range   Sodium 138 135 - 145 mmol/L   Potassium 4.1 3.5 - 5.1 mmol/L   Chloride 106 101 - 111 mmol/L   CO2 25 22 - 32 mmol/L   Glucose, Bld 115 (H) 65 - 99 mg/dL   BUN 20 6 - 20 mg/dL   Creatinine, Ser 1.57 (H) 0.61 - 1.24 mg/dL   Calcium 9.0 8.9 - 10.3 mg/dL   Total Protein 6.8 6.5 - 8.1 g/dL   Albumin 3.8 3.5 - 5.0 g/dL   AST 29 15 - 41 U/L   ALT 20 17 - 63 U/L   Alkaline Phosphatase 83 38 - 126 U/L   Total Bilirubin 0.5 0.3 - 1.2 mg/dL   GFR calc non Af Amer 38 (L) >60 mL/min   GFR calc Af Amer 44 (L) >60 mL/min    Comment: (NOTE) The eGFR has been calculated using the CKD EPI equation. This calculation has not been validated  in all clinical situations. eGFR's persistently <60 mL/min signify possible Chronic Kidney Disease.    Anion gap 7 5 - 15  Protime-INR     Status: Abnormal   Collection Time: 01/05/17  4:37 PM  Result Value Ref Range   Prothrombin Time 24.5 (H) 11.4 - 15.2 seconds   INR 2.17   MRSA PCR Screening     Status: None   Collection Time: 01/05/17 11:51  PM  Result Value Ref Range   MRSA by PCR NEGATIVE NEGATIVE    Comment:        The GeneXpert MRSA Assay (FDA approved for NASAL specimens only), is one component of a comprehensive MRSA colonization surveillance program. It is not intended to diagnose MRSA infection nor to guide or monitor treatment for MRSA infections.   Basic metabolic panel     Status: Abnormal   Collection Time: 01/06/17  4:48 AM  Result Value Ref Range   Sodium 137 135 - 145 mmol/L   Potassium 4.0 3.5 - 5.1 mmol/L   Chloride 106 101 - 111 mmol/L   CO2 25 22 - 32 mmol/L   Glucose, Bld 97 65 - 99 mg/dL   BUN 16 6 - 20 mg/dL   Creatinine, Ser 1.23 0.61 - 1.24 mg/dL   Calcium 8.6 (L) 8.9 - 10.3 mg/dL   GFR calc non Af Amer 51 (L) >60 mL/min   GFR calc Af Amer 59 (L) >60 mL/min    Comment: (NOTE) The eGFR has been calculated using the CKD EPI equation. This calculation has not been validated in all clinical situations. eGFR's persistently <60 mL/min signify possible Chronic Kidney Disease.    Anion gap 6 5 - 15  CBC     Status: Abnormal   Collection Time: 01/06/17  4:48 AM  Result Value Ref Range   WBC 7.4 4.0 - 10.5 K/uL   RBC 3.74 (L) 4.22 - 5.81 MIL/uL   Hemoglobin 11.8 (L) 13.0 - 17.0 g/dL   HCT 36.3 (L) 39.0 - 52.0 %   MCV 97.1 78.0 - 100.0 fL   MCH 31.6 26.0 - 34.0 pg   MCHC 32.5 30.0 - 36.0 g/dL   RDW 14.0 11.5 - 15.5 %   Platelets 175 150 - 400 K/uL  Protime-INR     Status: Abnormal   Collection Time: 01/06/17  4:48 AM  Result Value Ref Range   Prothrombin Time 24.7 (H) 11.4 - 15.2 seconds   INR 2.19     Dg Ankle Complete Right  Result  Date: 01/05/2017 CLINICAL DATA:  Recent fall with ankle pain and swelling, initial encounter EXAM: RIGHT ANKLE - COMPLETE 3+ VIEW COMPARISON:  None. FINDINGS: Transverse fractures are noted through the lateral and medial malleolus with some lateral displacement of the distal fracture fragments and lateral displacement of the talus with respect to the distal tibia. No posterior malleolar fracture is seen. The talus and calcaneus appear within normal limits. IMPRESSION: Distal tibial and fibular fractures with lateral displacement of the distal fracture fragments as well as the talus with respect to the more proximal tibia. Electronically Signed   By: Inez Catalina M.D.   On: 01/05/2017 16:05    Review of Systems  All other systems reviewed and are negative.  Blood pressure 117/68, pulse 78, temperature 97.6 F (36.4 C), temperature source Oral, resp. rate 18, SpO2 96 %. Physical Exam  Constitutional: He is oriented to person, place, and time. He appears well-developed and well-nourished.  HENT:  Head: Normocephalic.  Neck: Normal range of motion.  Cardiovascular: Normal rate.   Respiratory: Effort normal.  GI: Soft.  Musculoskeletal:       Right ankle: He exhibits deformity. He exhibits normal range of motion and no ecchymosis. Tenderness. Lateral malleolus and medial malleolus tenderness found.  Neurological: He is alert and oriented to person, place, and time.  Skin: Skin is warm and dry.  Psychiatric: He has a normal mood and affect.  Assessment/Plan: Unstable right bimalleolar ankle fracture  1)  I spoke to the patient and his wife about his ankle injury and the recommendation for surgery.  A discussion of the risks and benefits of surgery was had.  The plan is to proceed to surgery this am for open reduction/internal fixation of the right ankle.  He is on coumadin and his INR is 2.  A repeat INR is pending this am, however, the surgery will be performed using a touraquet and there is  usually not a concern for excessive bleeding with this type of surgery.  Mcarthur Rossetti 01/06/2017, 6:59 AM

## 2017-01-06 NOTE — Anesthesia Procedure Notes (Signed)
Procedure Name: LMA Insertion Date/Time: 01/06/2017 10:38 AM Performed by: Rejeana Brock L Pre-anesthesia Checklist: Patient identified, Emergency Drugs available, Suction available and Patient being monitored Patient Re-evaluated:Patient Re-evaluated prior to inductionOxygen Delivery Method: Circle System Utilized Preoxygenation: Pre-oxygenation with 100% oxygen Intubation Type: IV induction Ventilation: Mask ventilation without difficulty LMA: LMA inserted LMA Size: 5.0 Number of attempts: 1 Airway Equipment and Method: Bite block Placement Confirmation: positive ETCO2 and breath sounds checked- equal and bilateral Tube secured with: Tape Dental Injury: Teeth and Oropharynx as per pre-operative assessment

## 2017-01-06 NOTE — Brief Op Note (Signed)
01/05/2017 - 01/06/2017  11:46 AM  PATIENT:  [name]  83 y.o. male  PRE-OPERATIVE DIAGNOSIS:  RIGHT ANKLE FRACTURE  POST-OPERATIVE DIAGNOSIS:  RIGHT ANKLE FRACTURE  PROCEDURE:  Procedure(s): OPEN REDUCTION INTERNAL FIXATION (ORIF) ANKLE FRACTURE, RIGHT (Right)  SURGEON:  Surgeon(s) and Role:    * Mcarthur Rossetti, MD - Primary  PHYSICIAN ASSISTANT: Benita Stabile, PA-C  ANESTHESIA:   regional and general  EBL:  Total I/O In: 600 [I.V.:600] Out: 20 [Blood:20]  COUNTS:  YES  TOURNIQUET:  * Missing tourniquet times found for documented tourniquets in log:  [id number] *  DICTATION: .Other Dictation: Dictation Number [id number]  PLAN OF CARE: Admit to inpatient   PATIENT DISPOSITION:  PACU - hemodynamically stable.   Delay start of Pharmacological VTE agent (>24hrs) due to surgical blood loss or risk of bleeding: no

## 2017-01-06 NOTE — Anesthesia Preprocedure Evaluation (Addendum)
Anesthesia Evaluation  Patient identified by MRN, date of birth, ID band Patient awake    Reviewed: Allergy & Precautions, H&P , NPO status , Patient's Chart, lab work & pertinent test results  Airway Mallampati: II  TM Distance: >3 FB Neck ROM: Full    Dental no notable dental hx. (+) Teeth Intact, Dental Advisory Given   Pulmonary neg pulmonary ROS, sleep apnea and Continuous Positive Airway Pressure Ventilation ,    Pulmonary exam normal breath sounds clear to auscultation       Cardiovascular hypertension, Pt. on medications + CAD and + Cardiac Stents  Atrial Fibrillation  Rhythm:Irregular Rate:Normal     Neuro/Psych Depression negative neurological ROS     GI/Hepatic Neg liver ROS, GERD  Medicated and Controlled,  Endo/Other  Hypothyroidism   Renal/GU negative Renal ROS  negative genitourinary   Musculoskeletal   Abdominal   Peds  Hematology negative hematology ROS (+) anemia ,   Anesthesia Other Findings   Reproductive/Obstetrics negative OB ROS                            Anesthesia Physical Anesthesia Plan  ASA: III  Anesthesia Plan: General   Post-op Pain Management:  Regional for Post-op pain   Induction: Intravenous  PONV Risk Score and Plan: 2 and Ondansetron, Dexamethasone and Propofol  Airway Management Planned: LMA  Additional Equipment:   Intra-op Plan:   Post-operative Plan: Extubation in OR  Informed Consent: I have reviewed the patients History and Physical, chart, labs and discussed the procedure including the risks, benefits and alternatives for the proposed anesthesia with the patient or authorized representative who has indicated his/her understanding and acceptance.   Dental advisory given  Plan Discussed with: CRNA  Anesthesia Plan Comments:        Anesthesia Quick Evaluation

## 2017-01-06 NOTE — Progress Notes (Addendum)
ANTICOAGULATION CONSULT NOTE - follow up  Pharmacy Consult to restart Warfarin ;  Start IV heparin drip if INR falls <2 Indication: h/o  atrial fibrillation  Allergies  Allergen Reactions  . Metoprolol Tartrate Other (See Comments)    Anxiety, heart rate goes too low, feels like in a fog.    Patient Measurements:  Actual Weight: 84.4 kg Ideal body weight: 77.6 kg (171 lb 1.2 oz) Adjusted ideal body weight: 80.3 kg (177 lb 0.7 oz)  Heparin Dosing Weight: 84 kg  Vital Signs: Temp: 97.6 F (36.4 C) (06/23 0511) Temp Source: Oral (06/23 0511) BP: 113/75 (06/23 1020) Pulse Rate: 90 (06/23 1020)  Labs:  Recent Labs  01/05/17 1637 01/06/17 0448  HGB 12.5* 11.8*  HCT 37.6* 36.3*  PLT 187 175  LABPROT 24.5* 24.7*  INR 2.17 2.19  CREATININE 1.57* 1.23    Estimated Creatinine Clearance: 46.4 mL/min (by C-G formula based on SCr of 1.23 mg/dL).   Medical History: Past Medical History:  Diagnosis Date  . Anemia   . Barrett's esophagus   . BENIGN PROSTATIC HYPERTROPHY   . Chronic anticoagulation    a. on Coumadin  . CORONARY ARTERY DISEASE    a. s/p stent OM 2002. b. s/p BMS 2006 RCA  c. s/p cath 2007.  [name] DEPRESSION   . DIVERTICULOSIS, COLON   . GERD   . GYNECOMASTIA, UNILATERAL   . Hematoma of abdominal wall   . History of echocardiogram    a. EF 55% (TEE 06/2014)  . HYPERLIPIDEMIA   . HYPERTENSION   . HYPOTHYROIDISM   . Mallory - Weiss tear    a. > 5 years ago  . Mitral regurgitation    a. Mild-mod by TEE 06/2014.  [name] Paroxysmal A-fib (HCC)    a. chronic amiodarone and coumadin;  b. 05/2013 s/p DCCV. c. 06/2014 s/p TEE/DCCV.  [name] Popliteal artery embolism, right ([place])   . SLEEP APNEA    CPAP    Assessment: 49 YOM who presented on 6/22 s/p fall with distal tibial and fibular fractures. Ortho consulted and planning surgery. The patient was on warfarin PTA for hx Afib with admit INR 2.17 - warfarin held for surgery plans. Pharmacy consulted to bridge with Heparin  to start when INR  INR<2.   INR today = 2.19, therapeutic. Watch for INR drop  s/p Vitamin K po 10mg  x1 given 5/22 23:43PM Now s/p ORIF right ankle fracture. -Ortho okay to restart Warfarin for h/o afib.    F/u INR in AM If INR  drops <2, start IV heparin bridge H/H low/stable, pltc wnl 175 No bleeding reported  Pta warf dose:  2.5 mg daily except 5mg  qMF, last taken pta 6/21   Goal of Therapy:  INR 2-3 Monitor platelets by anticoagulation protocol: Yes   Plan:  Give Warfarin 5mg  po x1  F/u INR in AM , if drops <2, start IV heparin bridge per pharmacy consult  Thank you for allowing pharmacy to be a part of this patient's care. Nicole Cella, RPh Clinical Pharmacist Pager: [phone] 01/06/2017 12:18 PM

## 2017-01-06 NOTE — Progress Notes (Signed)
Patient refused CPAP for hours of sleep. States he has one at home, but just doesn't want to wear one at this time. Explained to patient that at anytime during his stay he changed his mind to just have them call RT and we would set him up on CPAP. No issues and patient agreed he would call if he changed his mind.

## 2017-01-06 NOTE — Anesthesia Procedure Notes (Addendum)
Anesthesia Regional Block: Popliteal block   Pre-Anesthetic Checklist: ,, timeout performed, Correct Patient, Correct Site, Correct Laterality, Correct Procedure, Correct Position, site marked, Risks and benefits discussed, pre-op evaluation,  At surgeon's request and post-op pain management  Laterality: Right  Prep: Maximum Sterile Barrier Precautions used, chloraprep       Needles:  Injection technique: Single-shot  Needle Type: Echogenic Stimulator Needle     Needle Length: 9cm  Needle Gauge: 21     Additional Needles:   Procedures: ultrasound guided,,,,,,,,  Narrative:  Start time: 01/06/2017 9:32 AM End time: 01/06/2017 9:42 AM Injection made incrementally with aspirations every 5 mL. Anesthesiologist: Roderic Palau  Additional Notes: 2% Lidocaine skin wheel. Adductor canal block with 15cc of 0.5% Bupivicaine w/ 1:200k epi

## 2017-01-06 NOTE — Transfer of Care (Signed)
Immediate Anesthesia Transfer of Care Note  Patient: ALANO BLASCO  Procedure(s) Performed: Procedure(s): OPEN REDUCTION INTERNAL FIXATION (ORIF) ANKLE FRACTURE, RIGHT (Right)  Patient Location: PACU  Anesthesia Type:GA combined with regional for post-op pain  Level of Consciousness: awake  Airway & Oxygen Therapy: Patient Spontanous Breathing and Patient connected to nasal cannula oxygen  Post-op Assessment: Report given to RN and Post -op Vital signs reviewed and stable  Post vital signs: Reviewed and stable  Last Vitals:  Vitals:   01/06/17 1020 01/06/17 1218  BP: 113/75   Pulse: 90   Resp: 12   Temp:  (P) 36.1 C    Last Pain:  Vitals:   01/06/17 1218  TempSrc:   PainSc: (P) 0-No pain         Complications: No apparent anesthesia complications

## 2017-01-07 LAB — COMPREHENSIVE METABOLIC PANEL
ALBUMIN: 3.1 g/dL — AB (ref 3.5–5.0)
ALT: 20 U/L (ref 17–63)
AST: 33 U/L (ref 15–41)
Alkaline Phosphatase: 66 U/L (ref 38–126)
Anion gap: 6 (ref 5–15)
BILIRUBIN TOTAL: 0.9 mg/dL (ref 0.3–1.2)
BUN: 13 mg/dL (ref 6–20)
CALCIUM: 8.6 mg/dL — AB (ref 8.9–10.3)
CO2: 23 mmol/L (ref 22–32)
CREATININE: 1.07 mg/dL (ref 0.61–1.24)
Chloride: 106 mmol/L (ref 101–111)
GFR calc Af Amer: >60 mL/min (ref >60)
GFR calc non Af Amer: >60 mL/min (ref >60)
GLUCOSE: 148 mg/dL — AB (ref 65–99)
Potassium: 4.1 mmol/L (ref 3.5–5.1)
SODIUM: 135 mmol/L (ref 135–145)
TOTAL PROTEIN: 6 g/dL — AB (ref 6.5–8.1)

## 2017-01-07 LAB — CBC
HEMATOCRIT: 36.5 % — AB (ref 39.0–52.0)
HEMOGLOBIN: 11.8 g/dL — AB (ref 13.0–17.0)
MCH: 31.6 pg (ref 26.0–34.0)
MCHC: 32.3 g/dL (ref 30.0–36.0)
MCV: 97.6 fL (ref 78.0–100.0)
Platelets: 174 10*3/uL (ref 150–400)
RBC: 3.74 MIL/uL — AB (ref 4.22–5.81)
RDW: 14.3 % (ref 11.5–15.5)
WBC: 12.2 10*3/uL — ABNORMAL HIGH (ref 4.0–10.5)

## 2017-01-07 LAB — PROTIME-INR
INR: 2.1
PROTHROMBIN TIME: 23.9 s — AB (ref 11.4–15.2)

## 2017-01-07 MED ORDER — HYDROCODONE-ACETAMINOPHEN 5-325 MG PO TABS: 1.0000 | ORAL_TABLET | ORAL | 0 refills | Status: AC | PRN

## 2017-01-07 MED ORDER — WARFARIN SODIUM 5 MG PO TABS
2.5000 mg | ORAL_TABLET | Freq: Once | ORAL | Status: AC
  Administered 2017-01-07: 2.5 mg via ORAL
  Filled 2017-01-07: qty 1

## 2017-01-07 NOTE — Evaluation (Signed)
Physical Therapy Evaluation Patient Details Name: [name] MRN: [id number] DOB: 1926-07-26 Today's Date: 01/07/2017   History of Present Illness  83 y.o.malewith medical history significant of chronic atrial fibrillation on Coumadin, CAD status post stent placement, hypertension, sleep apnea on CPAP who presents after a fall. He states that he was in the yard doing some work. He was on his knees and tried to use his cane to get up when he felt dizzy. He fell backward and had sharp pains in his right ankle. He does not know if he lost consciousness or not. s/p ORIF 6/23    Clinical Impression  Pt admitted with above diagnosis. Pt currently with functional limitations due to the deficits listed below (see PT Problem List). Patient limited by pain and endurance. He has steps to enter home and will need post-acute therapies prior to discharge home.  Pt will benefit from skilled PT to increase their independence and safety with mobility to allow discharge to the venue listed below.       Follow Up Recommendations SNF;Supervision/Assistance - 24 hour    Equipment Recommendations  Rolling walker with 5" wheels    Recommendations for Other Services       Precautions / Restrictions Precautions Precautions: Fall Restrictions Weight Bearing Restrictions: Yes RLE Weight Bearing: Non weight bearing (per ortho MD note 6/24)      Mobility  Bed Mobility Overal bed mobility: Needs Assistance Bed Mobility: Supine to Sit;Sit to Supine     Supine to sit: Min guard Sit to supine: Min guard   General bed mobility comments: Pt requires no physical A. Min gaurd for safety  Transfers Overall transfer level: Needs assistance Equipment used: Rolling walker (2 wheeled) Transfers: Sit to/from Stand Sit to Stand: Mod assist;+2 safety/equipment         General transfer comment: Mod A +2 to increase adherance to WB status; second person to assist with NWB  Ambulation/Gait Ambulation/Gait  assistance: Mod assist;+2 physical assistance;+2 safety/equipment Ambulation Distance (Feet): 4 Feet (x2) Assistive device: Rolling walker (2 wheeled) Gait Pattern/deviations: Step-to pattern     General Gait Details: required assist and constant cues for NWB RLE (at most was TDWB)  Science writer    Modified Rankin (Stroke Patients Only)       Balance Overall balance assessment: Needs assistance Sitting-balance support: No upper extremity supported;Feet supported Sitting balance-Leahy Scale: Good     Standing balance support: Bilateral upper extremity supported;During functional activity Standing balance-Leahy Scale: Poor Standing balance comment: Reliant on UE support                             Pertinent Vitals/Pain Pain Assessment: Faces Faces Pain Scale: Hurts whole lot Pain Location: rt ankle Pain Descriptors / Indicators: Constant;Grimacing;Operative site guarding;Pressure Pain Intervention(s): Limited activity within patient's tolerance;Monitored during session;Repositioned;Patient requesting pain meds-RN notified (elevation)    Home Living Family/patient expects to be discharged to:: Private residence Living Arrangements: Spouse/significant other Available Help at Discharge: Family;Available 24 hours/day Type of Home: House Home Access: Level entry     Home Layout: One level Home Equipment: Walker - 4 wheels;Shower seat;Grab bars - tub/shower;Hand held shower head;Electric scooter Additional Comments: states scooter can fit through house except for into bathroom and kitchen    Prior Function Level of Independence: Independent with assistive device(s)         Comments: does not  use scooter inside; mostly uses to drive around neighborhood or go to [place] Hand: Right    Extremity/Trunk Assessment   Upper Extremity Assessment Upper Extremity Assessment: Overall WFL for tasks  assessed    Lower Extremity Assessment Lower Extremity Assessment: RLE deficits/detail RLE Deficits / Details: swollen with discolored at base of toes; can lift leg off bed 2 inches, intermittently held off floor in NWB RLE: Unable to fully assess due to pain RLE Sensation: decreased light touch (still numb from surgery)    Cervical / Trunk Assessment Cervical / Trunk Assessment: Normal  Communication   Communication: HOH  Cognition Arousal/Alertness: Awake/alert Behavior During Therapy: Restless (due to pain) Overall Cognitive Status: Within Functional Limits for tasks assessed                                        General Comments General comments (skin integrity, edema, etc.): Wife present    Exercises     Assessment/Plan    PT Assessment Patient needs continued PT services  PT Problem List Decreased strength;Decreased activity tolerance;Decreased balance;Decreased mobility;Decreased knowledge of use of DME;Decreased knowledge of precautions;Pain       PT Treatment Interventions DME instruction;Gait training;Stair training;Functional mobility training;Therapeutic activities;Therapeutic exercise;Balance training;Patient/family education    PT Goals (Current goals can be found in the Care Plan section)  Acute Rehab PT Goals Patient Stated Goal: Stop pain PT Goal Formulation: With patient Time For Goal Achievement: 01/31/2017 Potential to Achieve Goals: Good    Frequency Min 3X/week   Barriers to discharge Inaccessible home environment;Decreased caregiver support      Co-evaluation PT/OT/SLP Co-Evaluation/Treatment: Yes Reason for Co-Treatment: For patient/therapist safety;To address functional/ADL transfers PT goals addressed during session: Mobility/safety with mobility;Balance;Proper use of DME OT goals addressed during session: ADL's and self-care       AM-PAC PT "6 Clicks" Daily Activity  Outcome Measure Difficulty turning over in bed  (including adjusting bedclothes, sheets and blankets)?: A Little Difficulty moving from lying on back to sitting on the side of the bed? : A Little Difficulty sitting down on and standing up from a chair with arms (e.g., wheelchair, bedside commode, etc,.)?: A Lot Help needed moving to and from a bed to chair (including a wheelchair)?: A Lot Help needed walking in hospital room?: Total Help needed climbing 3-5 steps with a railing? : Total 6 Click Score: 12    End of Session Equipment Utilized During Treatment: Gait belt Activity Tolerance: Patient limited by pain;Patient limited by fatigue Patient left: in bed;with call bell/phone within reach;with bed alarm set;with family/visitor present Nurse Communication: Mobility status PT Visit Diagnosis: Unsteadiness on feet (R26.81);Muscle weakness (generalized) (M62.81);Pain Pain - Right/Left: Right Pain - part of body: Leg    Time: 1324-1405 PT Time Calculation (min) (ACUTE ONLY): 41 min   Charges:   PT Evaluation $PT Eval Low Complexity: 1 Procedure PT Treatments $Therapeutic Activity: 8-22 mins   PT G Codes:          [employer], PT 01/07/2017, 5:20 PM

## 2017-01-07 NOTE — Evaluation (Signed)
Occupational Therapy Evaluation Patient Details Name: [name] MRN: [id number] DOB: [birth year] Today's Date: 01/07/2017    History of Present Illness 83 y.o.malewith medical history significant of chronic atrial fibrillation on Coumadin, CAD status post stent placement, hypertension, sleep apnea on CPAP who presents after a fall. He states that he was in the yard doing some work. He was on his knees and tried to use his cane to get up when he felt dizzy. He fell backward and had sharp pains in his right ankle. He does not know if he lost consciousness or not. s/p ORIF 6/23   Clinical Impression   PTA, pt was living with his wife and was independent. Currently, pt requires Mod A for LB ADLs and Mod A +2 for functional mobility. Pt would benefit from acute OT to increase his safety and independence with ADLs and functional mobility. Recommend dc to SNF for further OT to optimize his safety, independence, and adherence to NWB status during ADLs and functional mobility prior to transitioning home.     Follow Up Recommendations  SNF;Supervision/Assistance - 24 hour    Equipment Recommendations  Other (comment) (Defer to next venue)    Recommendations for Other Services PT consult     Precautions / Restrictions Precautions Precautions: Fall Restrictions Weight Bearing Restrictions: Yes RLE Weight Bearing: Non weight bearing (per ortho MD note 6/24)      Mobility Bed Mobility Overal bed mobility: Needs Assistance Bed Mobility: Supine to Sit;Sit to Supine     Supine to sit: Min guard Sit to supine: Min guard   General bed mobility comments: Pt requires no physical A. Min gaurd for safety  Transfers Overall transfer level: Needs assistance Equipment used: Rolling walker (2 wheeled) Transfers: Sit to/from Stand Sit to Stand: Mod assist;+2 safety/equipment         General transfer comment: Mod A +2 to increase adherance to WB status    Balance Overall balance  assessment: Needs assistance Sitting-balance support: No upper extremity supported;Feet supported Sitting balance-Leahy Scale: Good     Standing balance support: Bilateral upper extremity supported;During functional activity Standing balance-Leahy Scale: Poor Standing balance comment: Reliant on UE support                           ADL either performed or assessed with clinical judgement   ADL Overall ADL's : Needs assistance/impaired Eating/Feeding: Set up;Sitting   Grooming: Set up;Sitting   Upper Body Bathing: Set up;Sitting;Supervision/ safety   Lower Body Bathing: Sit to/from stand;Moderate assistance Lower Body Bathing Details (indicate cue type and reason): Mod A for standing balance to adhere to WB status Upper Body Dressing : Set up;Sitting;Supervision/safety   Lower Body Dressing: Sit to/from stand;Moderate assistance Lower Body Dressing Details (indicate cue type and reason): mod A for standing balance. Pt able to don underwear and bring pant legs over feet with Min A Toilet Transfer: +2 for safety/equipment;Ambulation;BSC;RW;Moderate assistance           Functional mobility during ADLs: +2 for physical assistance;+2 for safety/equipment;Rolling walker;Cueing for safety;Moderate assistance General ADL Comments: Pt with decreased functional performance. Pt requires Max A for standing balance to adhere to WB status     Vision         Perception     Praxis      Pertinent Vitals/Pain Pain Assessment: Faces Faces Pain Scale: Hurts whole lot Pain Location: rt ankle Pain Descriptors / Indicators: Constant;Grimacing;Operative site guarding;Pressure Pain Intervention(s): Monitored  during session;Limited activity within patient's tolerance;Repositioned;Patient requesting pain meds-RN notified     Hand Dominance Right   Extremity/Trunk Assessment Upper Extremity Assessment Upper Extremity Assessment: Overall WFL for tasks assessed   Lower Extremity  Assessment Lower Extremity Assessment: Defer to PT evaluation RLE Deficits / Details: swollen with discolored at base of toes; can lift leg off bed 2 inches, intermittently held off floor in NWB RLE: Unable to fully assess due to pain RLE Sensation:  (still numb from surgery)   Cervical / Trunk Assessment Cervical / Trunk Assessment: Normal   Communication Communication Communication: HOH (has bil hearing aids)   Cognition Arousal/Alertness: Awake/alert Behavior During Therapy: Restless (due to pain) Overall Cognitive Status: Within Functional Limits for tasks assessed                                     General Comments  Wife present throughout session    Exercises     Shoulder Instructions      Home Living Family/patient expects to be discharged to:: Private residence Living Arrangements: Spouse/significant other Available Help at Discharge: Family;Available 24 hours/day Type of Home: House Home Access: Level entry     Home Layout: One level     Bathroom Shower/Tub: Occupational psychologist: Standard Bathroom Accessibility: No (not w/c)   Home Equipment: Walker - 4 wheels;Shower seat;Grab bars - tub/shower;Hand held shower head;Electric scooter   Additional Comments: states scooter can fit through house except for into bathroom and kitchen      Prior Functioning/Environment Level of Independence: Independent with assistive device(s)        Comments: does not use scooter inside; mostly uses to drive around neighborhood or go to [place]        OT Problem List: Decreased strength;Decreased range of motion;Decreased activity tolerance;Impaired balance (sitting and/or standing);Decreased safety awareness;Decreased knowledge of use of DME or AE;Decreased knowledge of precautions;Pain      OT Treatment/Interventions: Self-care/ADL training;Therapeutic exercise;Energy conservation;DME and/or AE instruction;Therapeutic activities;Patient/family  education    OT Goals(Current goals can be found in the care plan section) Acute Rehab OT Goals Patient Stated Goal: Stop pain OT Goal Formulation: With patient Time For Goal Achievement: 01/21/17 Potential to Achieve Goals: Good ADL Goals Pt Will Perform Lower Body Bathing: with min assist;sit to/from stand Pt Will Perform Lower Body Dressing: with min assist;sit to/from stand;with adaptive equipment Pt Will Transfer to Toilet: with min assist;ambulating;bedside commode Pt Will Perform Toileting - Clothing Manipulation and hygiene: with min assist;sit to/from stand  OT Frequency: Min 2X/week   Barriers to D/C:            Co-evaluation PT/OT/SLP Co-Evaluation/Treatment: Yes Reason for Co-Treatment: For patient/therapist safety PT goals addressed during session: Mobility/safety with mobility;Balance;Proper use of DME OT goals addressed during session: ADL's and self-care      AM-PAC PT "6 Clicks" Daily Activity     Outcome Measure Help from another person eating meals?: None Help from another person taking care of personal grooming?: A Little Help from another person toileting, which includes using toliet, bedpan, or urinal?: A Lot Help from another person bathing (including washing, rinsing, drying)?: A Lot Help from another person to put on and taking off regular upper body clothing?: None Help from another person to put on and taking off regular lower body clothing?: A Lot 6 Click Score: 17   End of Session Equipment Utilized During Treatment: Gait  belt;Rolling walker;Oxygen (3L) Nurse Communication: Mobility status;Patient requests pain meds  Activity Tolerance: Patient tolerated treatment well;Patient limited by fatigue;Patient limited by pain Patient left: in bed;with call bell/phone within reach;with family/visitor present  OT Visit Diagnosis: Unsteadiness on feet (R26.81);Other abnormalities of gait and mobility (R26.89);Muscle weakness (generalized)  (M62.81);Pain Pain - Right/Left: Right Pain - part of body: Leg;Ankle and joints of foot                Time: 1324-1405 OT Time Calculation (min): 41 min Charges:  OT General Charges $OT Visit: 1 Procedure OT Evaluation $OT Eval Low Complexity: 1 Procedure G-Codes:     [place], OTR/L Acute Rehab Pager: [phone] Office: [place] 01/07/2017, 4:54 PM

## 2017-01-07 NOTE — Progress Notes (Signed)
Subjective: 1 Day Post-Op Procedure(s) (LRB): OPEN REDUCTION INTERNAL FIXATION (ORIF) ANKLE FRACTURE, RIGHT (Right) Patient reports pain as mild.  Complaining of numbness and unable to move toes right foot.    Objective: Vital signs in last 24 hours: Temp:  [97 F (36.1 C)-98.2 F (36.8 C)] 98.1 F (36.7 C) (06/24 0416) Pulse Rate:  [34-126] 102 (06/24 0416) Resp:  [5-38] 12 (06/23 1323) BP: (108-138)/(70-92) 108/70 (06/24 0416) SpO2:  [89 %-100 %] 98 % (06/24 0416)  Intake/Output from previous day: 06/23 0701 - 06/24 0700 In: 1205 [P.O.:480; I.V.:725] Out: 370 [Urine:350; Blood:20] Intake/Output this shift: No intake/output data recorded.   Recent Labs  01/05/17 1637 01/06/17 0448 01/07/17 0309  HGB 12.5* 11.8* 11.8*    Recent Labs  01/06/17 0448 01/07/17 0309  WBC 7.4 12.2*  RBC 3.74* 3.74*  HCT 36.3* 36.5*  PLT 175 174    Recent Labs  01/06/17 0448 01/07/17 0309  NA 137 135  K 4.0 4.1  CL 106 106  CO2 25 23  BUN 16 13  CREATININE 1.23 1.07  GLUCOSE 97 148*  CALCIUM 8.6* 8.6*    Recent Labs  01/06/17 2011 01/07/17 0309  INR 2.20 2.10   Right lower extremity: Compartment soft Right dorsal hind foot with sensation. Mid foot to toes subjective loss of sensation. Toes are warm, dry and nailbeds well profused. Ecchymosis over drsal right foot. Calf supple and non tender. Splint fits well , clean and dry.    Assessment/Plan: 1 Day Post-Op Procedure(s) (LRB): OPEN REDUCTION INTERNAL FIXATION (ORIF) ANKLE FRACTURE, RIGHT (Right) Up with therapy non weight bearing right lower extremity. Elevation right lower leg.   [name] 01/07/2017, 9:14 AM

## 2017-01-07 NOTE — Progress Notes (Signed)
Patient refuses CPAP. RT will continue to monitor as needed.

## 2017-01-07 NOTE — Progress Notes (Signed)
ANTICOAGULATION CONSULT NOTE - follow up   Pharmacy Consult for warfarin  Indication: atrial fibrillation  Allergies  Allergen Reactions  . Metoprolol Tartrate Other (See Comments)    Anxiety, heart rate goes too low, feels like in a fog.    Patient Measurements:  Actual Weight: 84.4 kg Ideal body weight: 77.6 kg (171 lb 1.2 oz) Adjusted ideal body weight: 80.3 kg (177 lb 0.7 oz)  Heparin Dosing Weight: 84 kg  Vital Signs: Temp: 98.1 F (36.7 C) (06/24 0416) Temp Source: Oral (06/24 0416) BP: 108/70 (06/24 0416) Pulse Rate: 102 (06/24 0416)  Labs:  Recent Labs  01/05/17 1637 01/06/17 0448 01/06/17 2011 01/07/17 0309  HGB 12.5* 11.8*  --  11.8*  HCT 37.6* 36.3*  --  36.5*  PLT 187 175  --  174  LABPROT 24.5* 24.7* 24.8* 23.9*  INR 2.17 2.19 2.20 2.10  CREATININE 1.57* 1.23  --  1.07    Estimated Creatinine Clearance: 53.4 mL/min (by C-G formula based on SCr of 1.07 mg/dL).   Assessment: 7 YOM who presented on 6/22 s/p fall with distal tibial and fibular fractures. Ortho consulted and performed ORIF right ankle fracture on 6/23. Pharmacy consulted to resume warfarin for Afib.   INR has not trended down much despite holding dose and vit K 10 mg PO on 6/22. INR remains therapeutic at 2.1 today. No bleeding noted, CBC is stable.  Pta warf dose:  2.5 mg daily except 5mg  qMF, last taken pta 6/21  Goal of Therapy:  INR 2-3 Monitor platelets by anticoagulation protocol: Yes   Plan:  Warfarin 2.5 mg PO tonight Daily INR Monitor for s/sx of bleeding If INR drops <2, will start IV heparin bridge per pharmacy consult  Thank you for allowing pharmacy to be a part of this patient's care.  Renold Genta, PharmD, BCPS Clinical Pharmacist Phone for today - [place] - [phone] 01/07/2017 9:33 AM

## 2017-01-07 NOTE — Progress Notes (Signed)
Patient ID: [name], male   DOB: 1927/05/20, 83 y.o.   MRN: [id number]                                                                PROGRESS NOTE                                                                                                                                                                                                             Patient Demographics:    [name], is a 83 y.o. male, DOB - 01/03/1927, BTD:[id number]  Admit date - 01/05/2017   Admitting Physician Shon Millet, DO  Outpatient Primary MD for the patient is Marletta Lor, MD  LOS - 2  Outpatient Specialists:     Chief Complaint  Patient presents with  . Ankle Injury  . Loss of Consciousness       Brief Narrative    83 y.o.malewith medical history significant of chronic atrial fibrillation on Coumadin, CAD status post stent placement, hypertension, hyperlipidemia, hypothyroidism, sleep apnea on CPAP who presents after a fall. Right ankle x-ray completed which showed distal tibial and fibular fractures. Orthopedic surgery was consulted   Subjective:    [name] today has been afebrile overnite.  Right foot/ ankle hurts a little.  Requesting pain medication.   No headache, No chest pain, No abdominal pain - No Nausea, No new weakness tingling or numbness, No Cough - SOB.   Assessment  & Plan :    Principal Problem:   Tibia/fibula fracture, right, closed, initial encounter Active Problems:   Bimalleolar ankle fracture, right, closed, initial encounter   Right distal tib/fib fracture with lateral displacement s/p OPEN REDUCTION INTERNAL FIXATION (ORIF)  6/23 (Dr. Ninfa Linden) -Pain control Appreciate orthopedic input Social work for possible SNF for rehab   Chronic A Fib -CHADSVASC 5 coumadin pharmacy to dose, cbc , INR in am  Leukocytosis secondary to stress from surgery Repeat cbc in am  AKI on CKD stage 3 -Baseline Cr 1.2-1.3 Stable Check cmp in  am  BPH Continue Proscar  Hypothyroidism Continue Synthroid  Hyperlipidemia Continue Pravachol  GERD Continue PPI  Anxiety Continue Klonopin, trazodone  Anemia Baseline hemoglobin around 12, follow-up postop CBC     DVT prophylaxsis Coumadin/heparin  Code Status:  Full code  Family Communication: Discussed in detail with the patient, all imaging results, lab results explained to the patient   Disposition Plan:   1-2 days      Consultants:  Orthopedics  Procedures:  None  Antibiotics:    Anti-infectives    None         Lab Results  Component Value Date   PLT 174 01/07/2017    Antibiotics  :  Ancef 6/23  Anti-infectives    Start     Dose/Rate Route Frequency Ordered Stop   01/06/17 1000  ceFAZolin (ANCEF) IVPB 2g/100 mL premix     2 g 200 mL/hr over 30 Minutes Intravenous  Once 01/06/17 0952 01/06/17 1039        Objective:   Vitals:   01/06/17 2040 01/07/17 0017 01/07/17 0416 01/07/17 1006  BP: 136/85 118/80 108/70 115/80  Pulse: (!) 110 77 (!) 102   Resp:      Temp: 97.7 F (36.5 C) 98.2 F (36.8 C) 98.1 F (36.7 C)   TempSrc: Oral Oral Oral   SpO2: 94% 94% 98%     Wt Readings from Last 3 Encounters:  12/28/16 84.4 kg (186 lb)  11/15/16 84.5 kg (186 lb 3.2 oz)  11/09/16 84.4 kg (186 lb)     Intake/Output Summary (Last 24 hours) at 01/07/17 1306 Last data filed at 01/07/17 0900  Gross per 24 hour  Intake              840 ml  Output              350 ml  Net              490 ml     Physical Exam  Awake Alert, Oriented X 3, No new F.N deficits, Normal affect [place].AT,PERRAL Supple Neck,No JVD, No cervical lymphadenopathy appriciated.  Symmetrical Chest wall movement, Good air movement bilaterally, CTAB Irr, irr, s1, s2, ,No Gallops,Rubs or new Murmurs, No Parasternal Heave +ve B.Sounds, Abd Soft, No tenderness, No organomegaly appriciated, No rebound - guarding or rigidity. No Cyanosis, Clubbing  or edema, No new Rash or bruise      Data Review:    CBC  Recent Labs Lab 01/05/17 1637 01/06/17 0448 01/07/17 0309  WBC 6.5 7.4 12.2*  HGB 12.5* 11.8* 11.8*  HCT 37.6* 36.3* 36.5*  PLT 187 175 174  MCV 96.2 97.1 97.6  MCH 32.0 31.6 31.6  MCHC 33.2 32.5 32.3  RDW 14.2 14.0 14.3  LYMPHSABS 0.9  --   --   MONOABS 0.8  --   --   EOSABS 0.1  --   --   BASOSABS 0.0  --   --     Chemistries   Recent Labs Lab 01/05/17 1637 01/06/17 0448 01/07/17 0309  NA 138 137 135  K 4.1 4.0 4.1  CL 106 106 106  CO2 25 25 23   GLUCOSE 115* 97 148*  BUN 20 16 13   CREATININE 1.57* 1.23 1.07  CALCIUM 9.0 8.6* 8.6*  AST 29  --  33  ALT 20  --  20  ALKPHOS 83  --  66  BILITOT 0.5  --  0.9   ------------------------------------------------------------------------------------------------------------------ No results for input(s): CHOL, HDL, LDLCALC, TRIG, CHOLHDL, LDLDIRECT in the last 72 hours.  No results found for: HGBA1C ------------------------------------------------------------------------------------------------------------------ No results for input(s): TSH, T4TOTAL, T3FREE, THYROIDAB in the last 72 hours.  Invalid input(s): FREET3 ------------------------------------------------------------------------------------------------------------------ No results for input(s): VITAMINB12, FOLATE, FERRITIN, TIBC, IRON, RETICCTPCT in the last  72 hours.  Coagulation profile  Recent Labs Lab 01/05/17 1637 01/06/17 0448 01/06/17 2011 01/07/17 0309  INR 2.17 2.19 2.20 2.10    No results for input(s): DDIMER in the last 72 hours.  Cardiac Enzymes No results for input(s): CKMB, TROPONINI, MYOGLOBIN in the last 168 hours.  Invalid input(s): CK ------------------------------------------------------------------------------------------------------------------ No results found for: BNP  Inpatient Medications  Scheduled Meds: . clonazePAM  1 mg Oral QHS  . diltiazem  180 mg  Oral Daily  . docusate sodium  100 mg Oral BID  . finasteride  5 mg Oral Daily  . levothyroxine  75 mcg Oral Once per day on Tue Thu Sat   And  . levothyroxine  50 mcg Oral Once per day on Sun Mon Wed Fri  . pantoprazole  40 mg Oral BID  . pravastatin  40 mg Oral QPM  . sodium chloride flush  3 mL Intravenous Q12H  . warfarin  2.5 mg Oral ONCE-1800  . warfarin  5 mg Oral ONCE-1800  . Warfarin - Pharmacist Dosing Inpatient   Does not apply q1800   Continuous Infusions: . lactated ringers 10 mL/hr at 01/06/17 0900   PRN Meds:.bisacodyl, HYDROcodone-acetaminophen, hydroxypropyl methylcellulose / hypromellose, morphine injection, oxyCODONE, polyethylene glycol, traZODone  Micro Results Recent Results (from the past 240 hour(s))  MRSA PCR Screening     Status: None   Collection Time: 01/05/17 11:51 PM  Result Value Ref Range Status   MRSA by PCR NEGATIVE NEGATIVE Final    Comment:        The GeneXpert MRSA Assay (FDA approved for NASAL specimens only), is one component of a comprehensive MRSA colonization surveillance program. It is not intended to diagnose MRSA infection nor to guide or monitor treatment for MRSA infections.     Radiology Reports Dg Ankle Complete Right  Result Date: 01/06/2017 CLINICAL DATA:  ORIF right ankle fractures EXAM: DG C-ARM 61-120 MIN; RIGHT ANKLE - COMPLETE 3+ VIEW COMPARISON:  01/05/2017 right ankle radiographs FINDINGS: Fluoroscopy time 0 minutes 38 seconds. Multiple nondiagnostic spot fluoroscopic intraoperative right ankle radiographs demonstrate postsurgical changes from transfixation of the right lateral malleolus fracture with lateral surgical plate and multiple interlocking screws and transfixation of the medial malleolus fracture with 2 screws. IMPRESSION: Intraoperative fluoroscopic guidance for ORIF bimalleolar right ankle fractures. Electronically Signed   By: Ilona Sorrel M.D.   On: 01/06/2017 11:57   Dg Ankle Complete Right  Result  Date: 01/05/2017 CLINICAL DATA:  Recent fall with ankle pain and swelling, initial encounter EXAM: RIGHT ANKLE - COMPLETE 3+ VIEW COMPARISON:  None. FINDINGS: Transverse fractures are noted through the lateral and medial malleolus with some lateral displacement of the distal fracture fragments and lateral displacement of the talus with respect to the distal tibia. No posterior malleolar fracture is seen. The talus and calcaneus appear within normal limits. IMPRESSION: Distal tibial and fibular fractures with lateral displacement of the distal fracture fragments as well as the talus with respect to the more proximal tibia. Electronically Signed   By: Inez Catalina M.D.   On: 01/05/2017 16:05   Dg C-arm 1-60 Min  Result Date: 01/06/2017 CLINICAL DATA:  ORIF right ankle fractures EXAM: DG C-ARM 61-120 MIN; RIGHT ANKLE - COMPLETE 3+ VIEW COMPARISON:  01/05/2017 right ankle radiographs FINDINGS: Fluoroscopy time 0 minutes 38 seconds. Multiple nondiagnostic spot fluoroscopic intraoperative right ankle radiographs demonstrate postsurgical changes from transfixation of the right lateral malleolus fracture with lateral surgical plate and multiple interlocking screws and transfixation of the  medial malleolus fracture with 2 screws. IMPRESSION: Intraoperative fluoroscopic guidance for ORIF bimalleolar right ankle fractures. Electronically Signed   By: Ilona Sorrel M.D.   On: 01/06/2017 11:57    Time Spent in minutes  30   Jani Gravel M.D on 01/07/2017 at 1:06 PM  Between 7am to 7pm - Pager - [phone]  After 7pm go to [email or web address] - password [facility]  [facility] -  Office  [phone]

## 2017-01-08 ENCOUNTER — Encounter (HOSPITAL_COMMUNITY): Payer: Self-pay | Admitting: Orthopaedic Surgery

## 2017-01-08 DIAGNOSIS — R262 Difficulty in walking, not elsewhere classified: Secondary | ICD-10-CM | POA: Diagnosis not present

## 2017-01-08 DIAGNOSIS — S9301XA Subluxation of right ankle joint, initial encounter: Secondary | ICD-10-CM | POA: Diagnosis not present

## 2017-01-08 DIAGNOSIS — S82401A Unspecified fracture of shaft of right fibula, initial encounter for closed fracture: Secondary | ICD-10-CM | POA: Diagnosis not present

## 2017-01-08 DIAGNOSIS — R0989 Other specified symptoms and signs involving the circulatory and respiratory systems: Secondary | ICD-10-CM | POA: Diagnosis not present

## 2017-01-08 DIAGNOSIS — S82209A Unspecified fracture of shaft of unspecified tibia, initial encounter for closed fracture: Secondary | ICD-10-CM | POA: Diagnosis not present

## 2017-01-08 DIAGNOSIS — M6281 Muscle weakness (generalized): Secondary | ICD-10-CM | POA: Diagnosis not present

## 2017-01-08 DIAGNOSIS — I482 Chronic atrial fibrillation: Secondary | ICD-10-CM | POA: Diagnosis not present

## 2017-01-08 DIAGNOSIS — S82891B Other fracture of right lower leg, initial encounter for open fracture type I or II: Secondary | ICD-10-CM | POA: Diagnosis not present

## 2017-01-08 DIAGNOSIS — R278 Other lack of coordination: Secondary | ICD-10-CM | POA: Diagnosis not present

## 2017-01-08 DIAGNOSIS — S8990XA Unspecified injury of unspecified lower leg, initial encounter: Secondary | ICD-10-CM | POA: Diagnosis not present

## 2017-01-08 DIAGNOSIS — I481 Persistent atrial fibrillation: Secondary | ICD-10-CM | POA: Diagnosis not present

## 2017-01-08 DIAGNOSIS — Z4789 Encounter for other orthopedic aftercare: Secondary | ICD-10-CM | POA: Diagnosis not present

## 2017-01-08 DIAGNOSIS — S86191A Other injury of other muscle(s) and tendon(s) of posterior muscle group at lower leg level, right leg, initial encounter: Secondary | ICD-10-CM | POA: Diagnosis not present

## 2017-01-08 DIAGNOSIS — R2681 Unsteadiness on feet: Secondary | ICD-10-CM | POA: Diagnosis not present

## 2017-01-08 DIAGNOSIS — S82201A Unspecified fracture of shaft of right tibia, initial encounter for closed fracture: Secondary | ICD-10-CM | POA: Diagnosis not present

## 2017-01-08 DIAGNOSIS — R41841 Cognitive communication deficit: Secondary | ICD-10-CM | POA: Diagnosis not present

## 2017-01-08 DIAGNOSIS — S82201S Unspecified fracture of shaft of right tibia, sequela: Secondary | ICD-10-CM | POA: Diagnosis not present

## 2017-01-08 DIAGNOSIS — R488 Other symbolic dysfunctions: Secondary | ICD-10-CM | POA: Diagnosis not present

## 2017-01-08 DIAGNOSIS — M25571 Pain in right ankle and joints of right foot: Secondary | ICD-10-CM | POA: Diagnosis not present

## 2017-01-08 DIAGNOSIS — R05 Cough: Secondary | ICD-10-CM | POA: Diagnosis not present

## 2017-01-08 DIAGNOSIS — N179 Acute kidney failure, unspecified: Secondary | ICD-10-CM | POA: Diagnosis not present

## 2017-01-08 DIAGNOSIS — N390 Urinary tract infection, site not specified: Secondary | ICD-10-CM | POA: Diagnosis not present

## 2017-01-08 DIAGNOSIS — S82841A Displaced bimalleolar fracture of right lower leg, initial encounter for closed fracture: Secondary | ICD-10-CM | POA: Diagnosis not present

## 2017-01-08 DIAGNOSIS — S82401S Unspecified fracture of shaft of right fibula, sequela: Secondary | ICD-10-CM | POA: Diagnosis not present

## 2017-01-08 DIAGNOSIS — S82841S Displaced bimalleolar fracture of right lower leg, sequela: Secondary | ICD-10-CM | POA: Diagnosis not present

## 2017-01-08 LAB — COMPREHENSIVE METABOLIC PANEL
ALT: 20 U/L (ref 17–63)
AST: 33 U/L (ref 15–41)
Albumin: 3.1 g/dL — ABNORMAL LOW (ref 3.5–5.0)
Alkaline Phosphatase: 65 U/L (ref 38–126)
Anion gap: 7 (ref 5–15)
BILIRUBIN TOTAL: 0.7 mg/dL (ref 0.3–1.2)
BUN: 18 mg/dL (ref 6–20)
CHLORIDE: 105 mmol/L (ref 101–111)
CO2: 25 mmol/L (ref 22–32)
CREATININE: 1.2 mg/dL (ref 0.61–1.24)
Calcium: 8.6 mg/dL — ABNORMAL LOW (ref 8.9–10.3)
GFR, EST NON AFRICAN AMERICAN: 53 mL/min — AB (ref >60)
Glucose, Bld: 129 mg/dL — ABNORMAL HIGH (ref 65–99)
POTASSIUM: 4.3 mmol/L (ref 3.5–5.1)
Sodium: 137 mmol/L (ref 135–145)
TOTAL PROTEIN: 5.9 g/dL — AB (ref 6.5–8.1)

## 2017-01-08 LAB — CBC
HCT: 34.2 % — ABNORMAL LOW (ref 39.0–52.0)
Hemoglobin: 11.2 g/dL — ABNORMAL LOW (ref 13.0–17.0)
MCH: 32 pg (ref 26.0–34.0)
MCHC: 32.7 g/dL (ref 30.0–36.0)
MCV: 97.7 fL (ref 78.0–100.0)
PLATELETS: 147 10*3/uL — AB (ref 150–400)
RBC: 3.5 MIL/uL — ABNORMAL LOW (ref 4.22–5.81)
RDW: 14.3 % (ref 11.5–15.5)
WBC: 10.8 10*3/uL — ABNORMAL HIGH (ref 4.0–10.5)

## 2017-01-08 LAB — PROTIME-INR
INR: 1.44
Prothrombin Time: 17.7 seconds — ABNORMAL HIGH (ref 11.4–15.2)

## 2017-01-08 MED ORDER — DOCUSATE SODIUM 100 MG PO CAPS: 100.0000 mg | ORAL_CAPSULE | Freq: Two times a day (BID) | ORAL | 0 refills | Status: AC

## 2017-01-08 MED ORDER — CLONAZEPAM 1 MG PO TABS: 1.0000 mg | ORAL_TABLET | Freq: Every day | ORAL | 5 refills | Status: AC

## 2017-01-08 MED ORDER — ENOXAPARIN SODIUM 150 MG/ML ~~LOC~~ SOLN: 120.0000 mg | SUBCUTANEOUS | 0 refills | Status: DC

## 2017-01-08 MED ORDER — WARFARIN SODIUM 5 MG PO TABS: 5.0000 mg | ORAL_TABLET | Freq: Once | ORAL | Status: DC

## 2017-01-08 MED ORDER — POLYETHYLENE GLYCOL 3350 17 G PO PACK: 17.0000 g | PACK | Freq: Every day | ORAL | 0 refills | Status: AC | PRN

## 2017-01-08 MED ORDER — ENOXAPARIN SODIUM 40 MG/0.4ML ~~LOC~~ SOLN
40.0000 mg | Freq: Every day | SUBCUTANEOUS | Status: DC
  Administered 2017-01-08: 40 mg via SUBCUTANEOUS
  Filled 2017-01-08: qty 0.4

## 2017-01-08 MED ORDER — HEPARIN (PORCINE) IN NACL 100-0.45 UNIT/ML-% IJ SOLN
1200.0000 [IU]/h | INTRAMUSCULAR | Status: DC
  Filled 2017-01-08: qty 250

## 2017-01-08 NOTE — Discharge Instructions (Signed)
Strict non-weight bearing on right ankle. Ice and elevation for swelling as needed. Keep splint clean and dry.   Information on my medicine - Coumadin   (Warfarin)  This medication education was reviewed with me or my healthcare representative as part of my discharge preparation.  The pharmacist that spoke with me during my hospital stay was:  Saundra Shelling, [facility]  Why was Coumadin prescribed for you? Coumadin was prescribed for you because you have a blood clot or a medical condition that can cause an increased risk of forming blood clots. Blood clots can cause serious health problems by blocking the flow of blood to the heart, lung, or brain. Coumadin can prevent harmful blood clots from forming. As a reminder your indication for Coumadin is:   Stroke Prevention Because Of Atrial Fibrillation  What test will check on my response to Coumadin? While on Coumadin (warfarin) you will need to have an INR test regularly to ensure that your dose is keeping you in the desired range. The INR (international normalized ratio) number is calculated from the result of the laboratory test called prothrombin time (PT).  If an INR APPOINTMENT HAS NOT ALREADY BEEN MADE FOR YOU please schedule an appointment to have this lab work done by your health care provider within 7 days. Your INR goal is usually a number between:  2 to 3 or your provider may give you a more narrow range like 2-2.5.  Ask your health care provider during an office visit what your goal INR is.  What  do you need to  know  About  COUMADIN? Take Coumadin (warfarin) exactly as prescribed by your healthcare provider about the same time each day.  DO NOT stop taking without talking to the doctor who prescribed the medication.  Stopping without other blood clot prevention medication to take the place of Coumadin may increase your risk of developing a new clot or stroke.  Get refills before you run out.  What do you do if you miss a dose? If you  miss a dose, take it as soon as you remember on the same day then continue your regularly scheduled regimen the next day.  Do not take two doses of Coumadin at the same time.  Important Safety Information A possible side effect of Coumadin (Warfarin) is an increased risk of bleeding. You should call your healthcare provider right away if you experience any of the following: ? Bleeding from an injury or your nose that does not stop. ? Unusual colored urine (red or dark brown) or unusual colored stools (red or black). ? Unusual bruising for unknown reasons. ? A serious fall or if you hit your head (even if there is no bleeding).  Some foods or medicines interact with Coumadin (warfarin) and might alter your response to warfarin. To help avoid this: ? Eat a balanced diet, maintaining a consistent amount of Vitamin K. ? Notify your provider about major diet changes you plan to make. ? Avoid alcohol or limit your intake to 1 drink for women and 2 drinks for men per day. (1 drink is 5 oz. wine, 12 oz. beer, or 1.5 oz. liquor.)  Make sure that ANY health care provider who prescribes medication for you knows that you are taking Coumadin (warfarin).  Also make sure the healthcare provider who is monitoring your Coumadin knows when you have started a new medication including herbals and non-prescription products.  Coumadin (Warfarin)  Major Drug Interactions  Increased Warfarin Effect Decreased Warfarin  Effect  Alcohol (large quantities) Antibiotics (esp. Septra/Bactrim, Flagyl, Cipro) Amiodarone (Cordarone) Aspirin (ASA) Cimetidine (Tagamet) Megestrol (Megace) NSAIDs (ibuprofen, naproxen, etc.) Piroxicam (Feldene) Propafenone (Rythmol SR) Propranolol (Inderal) Isoniazid (INH) Posaconazole (Noxafil) Barbiturates (Phenobarbital) Carbamazepine (Tegretol) Chlordiazepoxide (Librium) Cholestyramine (Questran) Griseofulvin Oral Contraceptives Rifampin Sucralfate (Carafate) Vitamin K    Coumadin (Warfarin) Major Herbal Interactions  Increased Warfarin Effect Decreased Warfarin Effect  Garlic Ginseng Ginkgo biloba Coenzyme Q10 Green tea St. Johns wort    Coumadin (Warfarin) FOOD Interactions  Eat a consistent number of servings per week of foods HIGH in Vitamin K (1 serving =  cup)  Collards (cooked, or boiled & drained) Kale (cooked, or boiled & drained) Mustard greens (cooked, or boiled & drained) Parsley *serving size only =  cup Spinach (cooked, or boiled & drained) Swiss chard (cooked, or boiled & drained) Turnip greens (cooked, or boiled & drained)  Eat a consistent number of servings per week of foods MEDIUM-HIGH in Vitamin K (1 serving = 1 cup)  Asparagus (cooked, or boiled & drained) Broccoli (cooked, boiled & drained, or raw & chopped) Brussel sprouts (cooked, or boiled & drained) *serving size only =  cup Lettuce, raw (green leaf, endive, romaine) Spinach, raw Turnip greens, raw & chopped   These websites have more information on Coumadin (warfarin):  FailFactory.se; VeganReport.com.au;

## 2017-01-08 NOTE — Op Note (Signed)
NAME:  [name], [name]                      ACCOUNT NO.:  MEDICAL RECORD NO.:  [id number]  LOCATION:                                 FACILITY:  PHYSICIAN:  Lind Guest. Ninfa Linden, M.D.DATE OF BIRTH:  07/15/1927  DATE OF PROCEDURE:  01/06/2017 DATE OF DISCHARGE:                              OPERATIVE REPORT   PREOPERATIVE DIAGNOSIS:  Right unstable bimalleolar ankle fracture.  POSTOPERATIVE DIAGNOSIS:  Right unstable bimalleolar ankle fracture.  PROCEDURES PERFORMED:  Open reduction internal fixation of right bimalleolar fracture using plates and screws.  IMPLANTS:  Stryker VariAx lateral fibular anatomic distal locking plate with 3.5 mm locking and 3.5 mm nonlocking screws.  Two partially- threaded cancellous screws through the medial malleolus.  SURGEON:  Lind Guest. Ninfa Linden.  ASSISTANT:  Erskine Emery, PA-C.  ANESTHESIA: 1. Right lower extremity regional popliteal block. 2. General.  TOURNIQUET TIME:  Under 1 hour.  COMPLICATIONS:  None.  INDICATIONS:  [name] is an 83 year old gentleman, who was working in his yard yesterday when he sustained a mechanical fall injuring his right ankle.  He sustained a subluxation of the ankle joint with a bimalleolar ankle fracture.  He was admitted to the Medicine Service and placed appropriately in a splint.  I was asked to see him by another colleague in town to see if I could get him on the operating schedule and schedule this morning.  I talked to him and his wife about his fracture showed in the x-rays.  We talked about the recommendation for surgery given this unstable fracture.  He is someone who is on Coumadin with an INR of 2.0; however, we are not concerned about bleeding with injury such as this and with the tourniquet.  The risks and benefits of the surgery were explained to him in detail and he did wish to proceed.  PROCEDURE DESCRIPTION:  After informed consent was obtained, appropriate right ankle was marked.   He was brought to the operating room, where Anesthesia had already obtained a popliteal right lower extremity regional block in the holding area.  In the operating room, he was placed supine on the operating table.  General anesthesia was then obtained.  A nonsterile tourniquet was placed around his upper right thigh.  His right leg was prepped and draped with DuraPrep and sterile drapes.  Time-out was called to identify correct patient and correct right ankle.  We used an Esmarch to wrap out the ankle and tourniquet inflated to 300 mm of pressure.  I then made an incision directly over the fibula and carried this proximally and distally.  We dissected down to the ankle joint and found a comminuted lateral malleolus fracture. We were able to irrigate the soft tissue around this normal saline solution and reduce the fracture.  We then placed a bridging plate over the fracture to reduce it under direct visualization and direct fluoroscopy.  We placed locking screws distally and bicortical screws proximally.  We then went to the medial side of the ankle, made a medial incision over the medial malleolus.  We were able to reduce this fracture under direct visualization and fluoroscopy, placed 2 temporary guide  pins.  We then drilled for a 2 partially-threaded cancellous screws that we placed on the medial side to reduce the fracture and we removed the guide pins.  We then put the ankle mortise under stress under direct visualization and fluoroscopy to stress the ankle mortise and found that it did remain stable and intact.  We then irrigated both wounds with normal saline solution, closed the deep tissue with 0 Vicryl followed by 2-0 Vicryl to subcutaneous tissue, and interrupted 2-0 nylon on the skin on both incisions.  Xeroform and well-padded sterile dressing was applied.  We placed him in a well-padded plaster splint. The tourniquet was let down.  The toes pinked nicely.  He was  awakened, extubated, and taken to the recovery room in stable condition.  All final counts were correct.  There were no complications noted.  Of note, [name] [facility] assisted in the entirety of the case.  His assistance was crucial for facilitating all aspects of this case.     Lind Guest. Ninfa Linden, M.D.     CYB/MEDQ  D:  01/06/2017  T:  01/06/2017  Job:  [id number]

## 2017-01-08 NOTE — Clinical Social Work Placement (Signed)
   CLINICAL SOCIAL WORK PLACEMENT  NOTE  Date:  01/08/2017  Patient Details  Name: [name] MRN: [id number] Date of Birth: 1927-02-25  Clinical Social Work is seeking post-discharge placement for this patient at the [place] level of care (*CSW will initial, date and re-position this form in  chart as items are completed):  Yes   Patient/family provided with [facility] Work Department's list of facilities offering this level of care within the geographic area requested by the patient (or if unable, by the patient's family).  Yes   Patient/family informed of their freedom to choose among providers that offer the needed level of care, that participate in Medicare, Medicaid or managed care program needed by the patient, have an available bed and are willing to accept the patient.  Yes   Patient/family informed of [facility]'s ownership interest in [facility] and [facility], as well as of the fact that they are under no obligation to receive care at these facilities.  PASRR submitted to EDS on       PASRR number received on 01/08/17     Existing PASRR number confirmed on 01/08/17     FL2 transmitted to all facilities in geographic area requested by pt/family on 01/08/17     FL2 transmitted to all facilities within larger geographic area on 01/08/17     Patient informed that his/her managed care company has contracts with or will negotiate with certain facilities, including the following:        Yes   Patient/family informed of bed offers received.  Patient chooses bed at [facility]     Physician recommends and patient chooses bed at      Patient to be transferred to [facility] on 01/08/17.  Patient to be transferred to facility by [facility]     Patient family notified on 01/08/17 of transfer.  Name of family member notified:  son     PHYSICIAN Please prepare priority discharge summary, including medications, Please prepare  prescriptions, Please sign FL2     Additional Comment:    _______________________________________________ Normajean Baxter, LCSW 01/08/2017, 2:32 PM

## 2017-01-08 NOTE — Discharge Summary (Addendum)
Physician Discharge Summary  [name] MRN: [id number] DOB/AGE: 11-09-1926 83 y.o.  PCP: Marletta Lor, MD   Admit date: 01/05/2017 Discharge date: 01/08/2017  Discharge Diagnoses:    Principal Problem:   Tibia/fibula fracture, right, closed, initial encounter Active Problems:   Persistent atrial fibrillation ([place])   Bimalleolar ankle fracture, right, closed, initial encounter    Follow-up recommendations Follow-up with PCP in 3-5 days , including all  additional recommended appointments as below Follow-up CBC, CMP in 3-5 days Daily INR, adjust Coumadin dosing per pharmacy, DC Lovenox when INR greater than 2.0     Current Discharge Medication List    START taking these medications   Details  docusate sodium (COLACE) 100 MG capsule Take 1 capsule (100 mg total) by mouth 2 (two) times daily. Qty: 10 capsule, Refills: 0    enoxaparin (LOVENOX) 150 MG/ML injection Inject 0.8 mLs (120 mg total) into the skin daily. Qty: 5 Syringe, Refills: 0    HYDROcodone-acetaminophen (NORCO/VICODIN) 5-325 MG tablet Take 1-2 tablets by mouth every 4 (four) hours as needed for moderate pain. Qty: 60 tablet, Refills: 0    polyethylene glycol (MIRALAX / GLYCOLAX) packet Take 17 g by mouth daily as needed for mild constipation. Qty: 14 each, Refills: 0      CONTINUE these medications which have CHANGED   Details  clonazePAM (KLONOPIN) 1 MG tablet Take 1 tablet (1 mg total) by mouth at bedtime. Qty: 5 tablet, Refills: 5      CONTINUE these medications which have NOT CHANGED   Details  Coenzyme Q10 (CO Q-10 PO) Take 100 mg by mouth daily.    DIGESTIVE ENZYMES PO Take 1 tablet by mouth 2 (two) times daily.     diltiazem (CARDIZEM CD) 180 MG 24 hr capsule Take 1 capsule (180 mg total) by mouth daily. Qty: 30 capsule, Refills: 3    finasteride (PROSCAR) 5 MG tablet Take 5 mg by mouth daily. Refills: 11    fish oil-omega-3 fatty acids 1000 MG capsule Take 1 g by mouth 2 (two)  times daily.     hydroxypropyl methylcellulose (ISOPTO TEARS) 2.5 % ophthalmic solution Place 1 drop into both eyes 3 (three) times daily as needed for dry eyes.     levothyroxine (SYNTHROID, LEVOTHROID) 50 MCG tablet Take 50 mcg by mouth See admin instructions. On Tuesday thursdays and saturdays patient stats he takes a full tablet then take a half along with it on these days. 1 whole tablet rest of days    Multiple Vitamin (MULTIVITAMIN) capsule Take 1 capsule by mouth daily.    pantoprazole (PROTONIX) 40 MG tablet Take 40 mg by mouth 2 (two) times daily.     pravastatin (PRAVACHOL) 40 MG tablet Take 40 mg by mouth every evening.    traZODone (DESYREL) 50 MG tablet Take by mouth at bedtime as needed for sleep.     warfarin (COUMADIN) 5 MG tablet Take 5 mg by mouth daily at 6 PM. Take 53m by mouth daily on Monday and friday. Take 2.547mby mouth daily on Tuesday, Wednesday, Thursday, Saturday, and Sunday.    nitroGLYCERIN (NITROSTAT) 0.4 MG SL tablet Place 0.4 mg under the tongue every 5 (five) minutes as needed for chest pain.           Discharge Condition: *Stable   Discharge Instructions Get Medicines reviewed and adjusted: Please take all your medications with you for your next visit with your Primary MD  Please request your Primary MD to go  over all hospital tests and procedure/radiological results at the follow up, please ask your Primary MD to get all Hospital records sent to his/her office.  If you experience worsening of your admission symptoms, develop shortness of breath, life threatening emergency, suicidal or homicidal thoughts you must seek medical attention immediately by calling 911 or calling your MD immediately if symptoms less severe.  You must read complete instructions/literature along with all the possible adverse reactions/side effects for all the Medicines you take and that have been prescribed to you. Take any new Medicines after you have completely  understood and accpet all the possible adverse reactions/side effects.   Do not drive when taking Pain medications.   Do not take more than prescribed Pain, Sleep and Anxiety Medications  Special Instructions: If you have smoked or chewed Tobacco in the last 2 yrs please stop smoking, stop any regular Alcohol and or any Recreational drug use.  Wear Seat belts while driving.  Please note  You were cared for by a hospitalist during your hospital stay. Once you are discharged, your primary care physician will handle any further medical issues. Please note that NO REFILLS for any discharge medications will be authorized once you are discharged, as it is imperative that you return to your primary care physician (or establish a relationship with a primary care physician if you do not have one) for your aftercare needs so that they can reassess your need for medications and monitor your lab values.     Allergies  Allergen Reactions  . Metoprolol Tartrate Other (See Comments)    Anxiety, heart rate goes too low, feels like in a fog.      Disposition: SNF   Consults:  Orthopedics    Significant Diagnostic Studies:  Dg Ankle Complete Right  Result Date: 01/06/2017 CLINICAL DATA:  ORIF right ankle fractures EXAM: DG C-ARM 61-120 MIN; RIGHT ANKLE - COMPLETE 3+ VIEW COMPARISON:  01/05/2017 right ankle radiographs FINDINGS: Fluoroscopy time 0 minutes 38 seconds. Multiple nondiagnostic spot fluoroscopic intraoperative right ankle radiographs demonstrate postsurgical changes from transfixation of the right lateral malleolus fracture with lateral surgical plate and multiple interlocking screws and transfixation of the medial malleolus fracture with 2 screws. IMPRESSION: Intraoperative fluoroscopic guidance for ORIF bimalleolar right ankle fractures. Electronically Signed   By: Ilona Sorrel M.D.   On: 01/06/2017 11:57   Dg Ankle Complete Right  Result Date: 01/05/2017 CLINICAL DATA:  Recent  fall with ankle pain and swelling, initial encounter EXAM: RIGHT ANKLE - COMPLETE 3+ VIEW COMPARISON:  None. FINDINGS: Transverse fractures are noted through the lateral and medial malleolus with some lateral displacement of the distal fracture fragments and lateral displacement of the talus with respect to the distal tibia. No posterior malleolar fracture is seen. The talus and calcaneus appear within normal limits. IMPRESSION: Distal tibial and fibular fractures with lateral displacement of the distal fracture fragments as well as the talus with respect to the more proximal tibia. Electronically Signed   By: Inez Catalina M.D.   On: 01/05/2017 16:05   Dg C-arm 1-60 Min  Result Date: 01/06/2017 CLINICAL DATA:  ORIF right ankle fractures EXAM: DG C-ARM 61-120 MIN; RIGHT ANKLE - COMPLETE 3+ VIEW COMPARISON:  01/05/2017 right ankle radiographs FINDINGS: Fluoroscopy time 0 minutes 38 seconds. Multiple nondiagnostic spot fluoroscopic intraoperative right ankle radiographs demonstrate postsurgical changes from transfixation of the right lateral malleolus fracture with lateral surgical plate and multiple interlocking screws and transfixation of the medial malleolus fracture with 2 screws. IMPRESSION:  Intraoperative fluoroscopic guidance for ORIF bimalleolar right ankle fractures. Electronically Signed   By: Ilona Sorrel M.D.   On: 01/06/2017 11:57       There were no vitals filed for this visit.   Microbiology: Recent Results (from the past 240 hour(s))  MRSA PCR Screening     Status: None   Collection Time: 01/05/17 11:51 PM  Result Value Ref Range Status   MRSA by PCR NEGATIVE NEGATIVE Final    Comment:        The GeneXpert MRSA Assay (FDA approved for NASAL specimens only), is one component of a comprehensive MRSA colonization surveillance program. It is not intended to diagnose MRSA infection nor to guide or monitor treatment for MRSA infections.        Blood Culture    Component  Value Date/Time   SDES STOOL 11/20/2009 1425   SPECREQUEST NONE 11/20/2009 1425   CULT NO GROWTH 11/10/2009 0122   REPTSTATUS 11/21/2009 FINAL 11/20/2009 1425      Labs: Results for orders placed or performed during the hospital encounter of 01/05/17 (from the past 48 hour(s))  Protime-INR     Status: Abnormal   Collection Time: 01/06/17  8:11 PM  Result Value Ref Range   Prothrombin Time 24.8 (H) 11.4 - 15.2 seconds   INR 2.20   Protime-INR     Status: Abnormal   Collection Time: 01/07/17  3:09 AM  Result Value Ref Range   Prothrombin Time 23.9 (H) 11.4 - 15.2 seconds   INR 2.10   CBC     Status: Abnormal   Collection Time: 01/07/17  3:09 AM  Result Value Ref Range   WBC 12.2 (H) 4.0 - 10.5 K/uL   RBC 3.74 (L) 4.22 - 5.81 MIL/uL   Hemoglobin 11.8 (L) 13.0 - 17.0 g/dL   HCT 36.5 (L) 39.0 - 52.0 %   MCV 97.6 78.0 - 100.0 fL   MCH 31.6 26.0 - 34.0 pg   MCHC 32.3 30.0 - 36.0 g/dL   RDW 14.3 11.5 - 15.5 %   Platelets 174 150 - 400 K/uL  Comprehensive metabolic panel     Status: Abnormal   Collection Time: 01/07/17  3:09 AM  Result Value Ref Range   Sodium 135 135 - 145 mmol/L   Potassium 4.1 3.5 - 5.1 mmol/L   Chloride 106 101 - 111 mmol/L   CO2 23 22 - 32 mmol/L   Glucose, Bld 148 (H) 65 - 99 mg/dL   BUN 13 6 - 20 mg/dL   Creatinine, Ser 1.07 0.61 - 1.24 mg/dL   Calcium 8.6 (L) 8.9 - 10.3 mg/dL   Total Protein 6.0 (L) 6.5 - 8.1 g/dL   Albumin 3.1 (L) 3.5 - 5.0 g/dL   AST 33 15 - 41 U/L   ALT 20 17 - 63 U/L   Alkaline Phosphatase 66 38 - 126 U/L   Total Bilirubin 0.9 0.3 - 1.2 mg/dL   GFR calc non Af Amer >60 >60 mL/min   GFR calc Af Amer >60 >60 mL/min    Comment: (NOTE) The eGFR has been calculated using the CKD EPI equation. This calculation has not been validated in all clinical situations. eGFR's persistently <60 mL/min signify possible Chronic Kidney Disease.    Anion gap 6 5 - 15     Lipid Panel     Component Value Date/Time   CHOL 130 10/07/2008  0927   TRIG 32.0 10/07/2008 0927   HDL 61.10 10/07/2008 0927  CHOLHDL 2 10/07/2008 0927   VLDL 6.4 10/07/2008 0927   LDLCALC 63 10/07/2008 0927     No results found for: HGBA1C    83 y.o.malewith medical history significant of chronic atrial fibrillation on Coumadin, CAD status post stent placement, hypertension, hyperlipidemia, hypothyroidism, sleep apnea on CPAP who presents after a fall. Right ankle x-ray completed which showed distal tibial and fibular fractures. Orthopedic surgery was consulted  Assessment and plan Right distal tib/fib fracture with lateral displacement   seen by Dr. Ninfa Linden ,s/p OPEN REDUCTION INTERNAL FIXATION (ORIF)  6/23 -Pain control -Lyndel Safe peri-op risk: 1.72% estimated risk probability for peri-op MI or cardiac arrest  Will need SNF  Chronic A Fib -CHADSVASC 5 Held coumadin prior to surgical procedure.given vit k 38m  6/22 Coumadin reinitiated after surgery, INR subtherapeutic on the day of discharge, started on Lovenox injections to bridge until INR greater than 2.0 -Continue Cardizem. Had been on amiodarone in the past, failed cardioversion     AKI on CKD stage 3 -Baseline Cr 1.2-1.3 Currently 1.07  BPH -Continue Proscar  Hypothyroidism -Continue Synthroid  Hyperlipidemia -Continue Pravachol  GERD -Continue PPI  Anxiety -Continue Klonopin, trazodone  Anemia Baseline hemoglobin around 12, hemoglobin 11.8 after surgery    Discharge Exam:   Blood pressure 132/75, pulse 77, temperature 97.9 F (36.6 C), temperature source Oral, resp. rate 16, SpO2 97 %. Cardiovascular system: S1 & S2 heard, RRR. No JVD, murmurs, rubs, gallops or clicks. No pedal edema. Gastrointestinal system: Abdomen is nondistended, soft and nontender. No organomegaly or masses felt. Normal bowel sounds heard. Central nervous system: Alert and oriented. No focal neurological deficits. Extremities:  Deformity of the right ankle , currently in an Ace  wrap Skin: No rashes, lesions or ulcers Psychiatry: Judgement and insight appear normal. Mood & affect appropriate.      Follow-up Information    BMcarthur Rossetti MD. Schedule an appointment as soon as possible for a visit in 2 week(s).   Specialty:  Orthopedic Surgery Contact information: [address] [phone]        KMarletta Lor MD. Call.   Specialty:  Internal Medicine Why:  hospital follow-up in 3-5 days Contact information: [phone]           Signed: NReyne Dumas6/25/2018, 8:08 AM        Time spent >1 hour

## 2017-01-08 NOTE — Progress Notes (Signed)
Pt ready for d/c to SNF today per MD. Report was called to [place] at [facility], all questions answered. Pt belongings sent with him. Wife and son aware of plan. Pt transferred to facility via [place].   [place], Jerry Caras

## 2017-01-08 NOTE — Progress Notes (Signed)
Patient ID: [name], male   DOB: 08/13/1926, 83 y.o.   MRN: [id number] No acute changes.  Right ankle stable post-op and in splint.  Can be discharged from an ortho standpoint.

## 2017-01-08 NOTE — Progress Notes (Addendum)
ANTICOAGULATION CONSULT NOTE - follow up   Pharmacy Consult:  Heparin / Coumadin Indication: atrial fibrillation  Allergies  Allergen Reactions  . Metoprolol Tartrate Other (See Comments)    Anxiety, heart rate goes too low, feels like in a fog.    Patient Measurements:  Actual Weight: 84.4 kg Ideal body weight: 77.6 kg (171 lb 1.2 oz) Adjusted ideal body weight: 80.3 kg (177 lb 0.7 oz)  Heparin Dosing Weight: 84 kg  Vital Signs: Temp: 98 F (36.7 C) (06/25 0715) Temp Source: Oral (06/25 0715) BP: 94/60 (06/25 0922) Pulse Rate: 93 (06/25 0922)  Labs:  Recent Labs  01/06/17 0448 01/06/17 2011 01/07/17 0309 01/08/17 0820  HGB 11.8*  --  11.8* 11.2*  HCT 36.3*  --  36.5* 34.2*  PLT 175  --  174 147*  LABPROT 24.7* 24.8* 23.9* 17.7*  INR 2.19 2.20 2.10 1.44  CREATININE 1.23  --  1.07 1.20    Estimated Creatinine Clearance: 47.6 mL/min (by C-G formula based on SCr of 1.2 mg/dL).   Assessment: 6 YOM on Coumadin PTA for history of AFib presented on 01/05/17 s/p fall with distal tibial and fibular fractures.  Patient's INR was reversed on 01/05/17 for ORIF on 01/06/17.    Pharmacy consulted to manage Coumadin and initiate IV heparin when INR falls below 2.  INR decreased to 1.44 this AM.  Patient has mild anemia and thrombocytopenia; no bleeding reported.  Home Coumadin dose:  2.5 mg daily except 5mg  qMF   Goal of Therapy:  INR 2-3 Heparin level 0.3-0.7 units/ml Monitor platelets by anticoagulation protocol: Yes    Plan:  - Coumadin 5mg  PO today - Daily PT / INR - Spoke to Dr. Allyson Sabal, plan to discharge so Pharmacy does not need to start IV heparin.  Start Lovenox 40mg  SQ Q24H.    Dezyrae Kensinger D. Mina Marble, PharmD, BCPS Pager:  [phone] 01/08/2017, 10:29 AM

## 2017-01-08 NOTE — Social Work (Signed)
Clinical Social Worker facilitated patient discharge including contacting patient family and facility to confirm patient discharge plans.  Clinical information faxed to facility and family agreeable with plan.    CSW arranged ambulance transport via [facility] to [facility].   RN to call [phone] to give report prior to discharge.  Pt going to Room 602P in [facility].  Clinical Social Worker will sign off for now as social work intervention is no longer needed. Please consult [place] again if new need arises.  Elissa Hefty, LCSW Clinical Social Worker [phone]

## 2017-01-08 NOTE — Progress Notes (Signed)
Physical Therapy Treatment Patient Details Name: [name] MRN: [id number] DOB: June 29, 1927 Today's Date: 01/08/2017    History of Present Illness 83 y.o.malewith medical history significant of chronic atrial fibrillation on Coumadin, CAD status post stent placement, hypertension, sleep apnea on CPAP who presents after a fall. He states that he was in the yard doing some work. He was on his knees and tried to use his cane to get up when he felt dizzy. He fell backward and had sharp pains in his right ankle. He does not know if he lost consciousness or not. s/p ORIF 6/23    PT Comments    Today's skilled session focused on providing pt with HEP for general strengthening. Pt slowly progressing towards goals. He is expected to d/c to SNF this afternoon. Should continue to benefit from continued skilled PT to achieve unmet goals and increase functional independence.    Follow Up Recommendations  SNF;Supervision/Assistance - 24 hour     Equipment Recommendations  Rolling walker with 5" wheels    Recommendations for Other Services       Precautions / Restrictions Precautions Precautions: Fall Restrictions Weight Bearing Restrictions: Yes RLE Weight Bearing: Non weight bearing    Mobility  Bed Mobility Overal bed mobility: Needs Assistance Bed Mobility: Supine to Sit     Supine to sit: Min assist     General bed mobility comments: Pt required min A to progress R LE to EOB  Transfers Overall transfer level: Needs assistance Equipment used: Rolling walker (2 wheeled) Transfers: Sit to/from Stand Sit to Stand: Mod assist         General transfer comment: Mod A to adhear to WB status. Two attemptes to stand. Pt able to maintain WB status after cueing.  Ambulation/Gait Ambulation/Gait assistance: Mod assist Ambulation Distance (Feet): 6 Feet Assistive device: Rolling walker (2 wheeled) Gait Pattern/deviations:  (Hop to)     General Gait Details: Min a to steady  during gait. Able to maintain NWB on R LE.   Stairs            Wheelchair Mobility    Modified Rankin (Stroke Patients Only)       Balance Overall balance assessment: Needs assistance Sitting-balance support: No upper extremity supported;Feet supported Sitting balance-Leahy Scale: Good     Standing balance support: Bilateral upper extremity supported;During functional activity Standing balance-Leahy Scale: Poor Standing balance comment: Reliant on UE support                            Cognition Arousal/Alertness: Awake/alert Behavior During Therapy: WFL for tasks assessed/performed Overall Cognitive Status: Within Functional Limits for tasks assessed                                        Exercises Total Joint Exercises Ankle Circles/Pumps: AROM;Left;10 reps;Supine Gluteal Sets: AROM;Both;10 reps;Supine Short Arc Quad: AROM;Left;10 reps;Supine Heel Slides: AROM;Left;10 reps;Supine Hip ABduction/ADduction: AROM;Left;5 reps;Supine General Exercises - Upper Extremity Shoulder Flexion: AROM;Both;5 reps;Supine    General Comments General comments (skin integrity, edema, etc.): Wife present      Pertinent Vitals/Pain Pain Assessment: 0-10 Pain Score: 5  Pain Location: rt ankle Pain Descriptors / Indicators: Constant;Grimacing;Operative site guarding;Pressure Pain Intervention(s): Monitored during session;Repositioned    Home Living  Prior Function            PT Goals (current goals can now be found in the care plan section) Acute Rehab PT Goals Patient Stated Goal: Stop pain PT Goal Formulation: With patient Time For Goal Achievement: 02/11/2017 Potential to Achieve Goals: Good Progress towards PT goals: Progressing toward goals    Frequency    Min 3X/week      PT Plan Current plan remains appropriate    Co-evaluation              AM-PAC PT "6 Clicks" Daily Activity  Outcome  Measure  Difficulty turning over in bed (including adjusting bedclothes, sheets and blankets)?: A Little Difficulty moving from lying on back to sitting on the side of the bed? : A Little Difficulty sitting down on and standing up from a chair with arms (e.g., wheelchair, bedside commode, etc,.)?: A Lot Help needed moving to and from a bed to chair (including a wheelchair)?: A Lot Help needed walking in hospital room?: A Lot Help needed climbing 3-5 steps with a railing? : Total 6 Click Score: 13    End of Session Equipment Utilized During Treatment: Gait belt Activity Tolerance: Patient tolerated treatment well Patient left: with call bell/phone within reach;with family/visitor present;in chair Nurse Communication: Mobility status PT Visit Diagnosis: Unsteadiness on feet (R26.81);Muscle weakness (generalized) (M62.81);Pain Pain - Right/Left: Right Pain - part of body: Leg     Time: 1442-1511 PT Time Calculation (min) (ACUTE ONLY): 29 min  Charges:  $Gait Training: 8-22 mins $Therapeutic Exercise: 8-22 mins                    G Codes:       Benjiman Core, [place] Pager [phone] Acute Rehab    Allena Katz 01/08/2017, 3:20 PM

## 2017-01-08 NOTE — [place] FL2 (Signed)
[place] LEVEL OF CARE SCREENING TOOL     IDENTIFICATION  Patient Name: [name] Birthdate: [birth year] Sex: male Admission Date (Current Location): 01/05/2017  [facility] and [place] Number:  Herbalist and Address:  [facility] [place] [place] [address], [place], [place] [address]      Provider Number: [id number]  Attending Physician Name and Address:  Reyne Dumas, MD  Relative Name and Phone Number:       Current Level of Care: Hospital Recommended Level of Care: [place] Prior Approval Number:    Date Approved/Denied: 01/08/17 PASRR Number: [id number]  Discharge Plan: SNF    Current Diagnoses: Patient Active Problem List   Diagnosis Date Noted  . Bimalleolar ankle fracture, right, closed, initial encounter   . Tibia/fibula fracture, right, closed, initial encounter 01/05/2017  . Permanent atrial fibrillation ([place]) 12/28/2016  . Atrial fibrillation with RVR ([place])   . Persistent atrial fibrillation ([place]) 11/26/2015  . Anemia   . Fatigue 06/26/2014  . Chronic anticoagulation   . Iron deficiency anemia 01/21/2014  . Encounter for therapeutic drug monitoring 09/10/2013  . Paroxysmal a-fib ([place]), Rapid Ventricular Response   . Insomnia 11/25/2012  . Parasomnia 11/04/2012  . Long term (current) use of anticoagulants - Coumadin 09/01/2011  . GYNECOMASTIA, UNILATERAL 03/23/2010  . WEAKNESS 11/09/2009  . Backache 09/16/2009  . HLD (hyperlipidemia) 10/20/2008  . Mitral regurgitation 10/20/2008  . DEPRESSION 12/17/2007  . Hypothyroidism 05/20/2007  . OSA (obstructive sleep apnea) 05/20/2007  . Essential hypertension 04/10/2007  . CAD (coronary artery disease) 04/10/2007  . GERD 04/10/2007  . DIVERTICULOSIS, COLON 04/10/2007  . BENIGN PROSTATIC HYPERTROPHY 04/10/2007    Orientation RESPIRATION BLADDER Height & Weight     Self, Time, Situation  Normal Continent Weight:   Height:     BEHAVIORAL  SYMPTOMS/MOOD NEUROLOGICAL BOWEL NUTRITION STATUS      Continent Diet (See DC Summary)  AMBULATORY STATUS COMMUNICATION OF NEEDS Skin   Extensive Assist Verbally Surgical wounds (Right Leg Closed Incision with Compression wrap)                       Personal Care Assistance Level of Assistance  Bathing, Feeding, Dressing Bathing Assistance: Limited assistance Feeding assistance: Limited assistance Dressing Assistance: Limited assistance     Functional Limitations Info  Sight, Hearing, Speech Sight Info: Adequate Hearing Info: Adequate Speech Info: Adequate    SPECIAL CARE FACTORS FREQUENCY  PT (By licensed PT), OT (By licensed OT)     PT Frequency: 5xweek OT Frequency: 5xweek            Contractures Contractures Info: Not present    Additional Factors Info  Code Status, Allergies, Psychotropic Code Status Info: Full Allergies Info: METOPROLOL TARTRATE  Psychotropic Info: Klonopin         Current Medications (01/08/2017):  This is the current hospital active medication list Current Facility-Administered Medications  Medication Dose Route Frequency Provider Last Rate Last Dose  . bisacodyl (DULCOLAX) EC tablet 5 mg  5 mg Oral Daily PRN Dessa Phi Chahn-Yang, DO      . clonazePAM Bobbye [place]) tablet 1 mg  1 mg Oral QHS Dessa Phi Chahn-Yang, DO   1 mg at 01/07/17 2205  . diltiazem (CARDIZEM CD) 24 hr capsule 180 mg  180 mg Oral Daily Dessa Phi Chahn-Yang, DO   180 mg at 01/07/17 1006  . docusate sodium (COLACE) capsule 100 mg  100 mg Oral BID Maylene Roes,  Jennifer Chahn-Yang, DO   100 mg at 01/08/17 5170  . finasteride (PROSCAR) tablet 5 mg  5 mg Oral Daily Dessa Phi Chahn-Yang, DO   5 mg at 01/08/17 0174  . heparin ADULT infusion 100 units/mL (25000 units/244mL sodium chloride 0.45%)  1,200 Units/hr Intravenous Continuous Tyrone Apple, RPH      . HYDROcodone-acetaminophen (NORCO/VICODIN) 5-325 MG per tablet 1-2 tablet  1-2 tablet Oral Q4H PRN Dessa Phi Chahn-Yang, DO   2 tablet at 01/08/17 0303  . hydroxypropyl methylcellulose / hypromellose (ISOPTO TEARS / GONIOVISC) 2.5 % ophthalmic solution 1 drop  1 drop Both Eyes TID PRN Dessa Phi Chahn-Yang, DO      . lactated ringers infusion   Intravenous Continuous Roderic Palau, MD 10 mL/hr at 01/06/17 0900    . levothyroxine (SYNTHROID, LEVOTHROID) tablet 75 mcg  75 mcg Oral Once per day on Tue Thu Sat Dessa Phi Chahn-Yang, DO       And  . levothyroxine (SYNTHROID, LEVOTHROID) tablet 50 mcg  50 mcg Oral Once per day on Sun Mon Wed Fri Dessa Phi Chahn-Yang, DO   50 mcg at 01/08/17 9449  . morphine 4 MG/ML injection 1 mg  1 mg Intravenous Q4H PRN Dessa Phi Chahn-Yang, DO   1 mg at 01/07/17 1501  . oxyCODONE (Oxy IR/ROXICODONE) immediate release tablet 5 mg  5 mg Oral Q3H PRN Mcarthur Rossetti, MD   5 mg at 01/08/17 6759  . pantoprazole (PROTONIX) EC tablet 40 mg  40 mg Oral BID Dessa Phi Chahn-Yang, DO   40 mg at 01/08/17 1638  . polyethylene glycol (MIRALAX / GLYCOLAX) packet 17 g  17 g Oral Daily PRN Dessa Phi Chahn-Yang, DO      . pravastatin (PRAVACHOL) tablet 40 mg  40 mg Oral QPM Reyne Dumas, MD   40 mg at 01/07/17 2032  . sodium chloride flush (NS) 0.9 % injection 3 mL  3 mL Intravenous Q12H Dessa Phi Chahn-Yang, DO   3 mL at 01/08/17 0925  . traZODone (DESYREL) tablet 50 mg  50 mg Oral QHS PRN Dessa Phi Chahn-Yang, DO      . warfarin (COUMADIN) tablet 5 mg  5 mg Oral ONCE-1800 Tyrone Apple, RPH      . Warfarin - Pharmacist Dosing Inpatient   Does not apply q1800 Reyne Dumas, MD         Discharge Medications: Please see discharge summary for a list of discharge medications.  Relevant Imaging Results:  Relevant Lab Results:   Additional Information SS: [place], LCSW

## 2017-01-08 NOTE — Clinical Social Work Note (Signed)
Clinical Social Work Assessment  Patient Details  Name: [name] MRN: [id number] Date of Birth: 1926-11-07  Date of referral:  01/08/17               Reason for consult:  Facility Placement                Permission sought to share information with:  Facility Art therapist granted to share information::  Yes, Verbal Permission Granted  Name::     son  Agency::  SNF  Relationship::     Contact Information:     Housing/Transportation Living arrangements for the past 2 months:  [place] of Information:  Adult Children Patient Interpreter Needed:  None Criminal Activity/Legal Involvement Pertinent to Current Situation/Hospitalization:  No - Comment as needed Significant Relationships:  Adult Children, Spouse Lives with:  Spouse Do you feel safe going back to the place where you live?  No Need for family participation in patient care:  Yes (Comment)  Care giving concerns:  Pt resided with spouse prior to hospitalization. Pt unsafe to return home at this time.  Social Worker assessment / plan:  CSW met with family at bedside to discuss clinical teams recommendation at DC. CSW explained her role in DC process and explained SNF options/placement. Family wanted CIR placement. CSW explained the process and that would have to come from medical doctor and PT therapist. [place] confirmed the recommendation was for SNF placement. CSW obtained verbal permission to send out offers to local SNF's.   FL2 complete. Passr obtained. Offers sent out.  Employment status:  Retired Forensic scientist:  [employer] PT Recommendations:  [place] / Referral to community resources:  [place]  Patient/Family's Response to care:  Psychologist, prison and probation services of assistance for SNF placement. No other issues or concerns identified at this time.  Patient/Family's Understanding of and Emotional Response to Diagnosis, Current  Treatment, and Prognosis:  Patient/family has good understanding of diagnosis, current treatment and prognosis. They are hopeful that rehabilitation will assist with impairment. No issues or concerns identified at this time.  Emotional Assessment Appearance:  Appears stated age Attitude/Demeanor/Rapport:   (Cooperative) Affect (typically observed):  Accepting, Appropriate Orientation:  Oriented to Self, Oriented to Place, Oriented to  Time, Oriented to Situation Alcohol / Substance use:  Not Applicable Psych involvement (Current and /or in the community):  Outpatient Provider  Discharge Needs  Concerns to be addressed:  Care Coordination Readmission within the last 30 days:  No Current discharge risk:  Physical Impairment, Dependent with Mobility Barriers to Discharge:  No Barriers Identified   Normajean Baxter, LCSW 01/08/2017, 10:42 AM

## 2017-01-10 DIAGNOSIS — M25571 Pain in right ankle and joints of right foot: Secondary | ICD-10-CM | POA: Diagnosis not present

## 2017-01-10 DIAGNOSIS — R2681 Unsteadiness on feet: Secondary | ICD-10-CM | POA: Diagnosis not present

## 2017-01-11 DIAGNOSIS — I482 Chronic atrial fibrillation: Secondary | ICD-10-CM | POA: Diagnosis not present

## 2017-01-11 DIAGNOSIS — S82841A Displaced bimalleolar fracture of right lower leg, initial encounter for closed fracture: Secondary | ICD-10-CM | POA: Diagnosis not present

## 2017-01-11 DIAGNOSIS — R2681 Unsteadiness on feet: Secondary | ICD-10-CM | POA: Diagnosis not present

## 2017-01-11 DIAGNOSIS — S82201A Unspecified fracture of shaft of right tibia, initial encounter for closed fracture: Secondary | ICD-10-CM | POA: Diagnosis not present

## 2017-01-11 DIAGNOSIS — M25571 Pain in right ankle and joints of right foot: Secondary | ICD-10-CM | POA: Diagnosis not present

## 2017-01-14 ENCOUNTER — Emergency Department (HOSPITAL_COMMUNITY): Payer: Medicare Other

## 2017-01-14 ENCOUNTER — Encounter (HOSPITAL_COMMUNITY): Payer: Self-pay

## 2017-01-14 ENCOUNTER — Other Ambulatory Visit: Payer: Self-pay

## 2017-01-14 ENCOUNTER — Inpatient Hospital Stay (HOSPITAL_COMMUNITY): Payer: Medicare Other

## 2017-01-14 ENCOUNTER — Inpatient Hospital Stay (HOSPITAL_COMMUNITY)
Admission: EM | Admit: 2017-01-14 | Discharge: 2017-02-14 | DRG: 871 | Disposition: E | Payer: Medicare Other | Attending: Family Medicine | Admitting: Family Medicine

## 2017-01-14 DIAGNOSIS — N183 Chronic kidney disease, stage 3 unspecified: Secondary | ICD-10-CM | POA: Diagnosis present

## 2017-01-14 DIAGNOSIS — I481 Persistent atrial fibrillation: Secondary | ICD-10-CM | POA: Diagnosis present

## 2017-01-14 DIAGNOSIS — S82201S Unspecified fracture of shaft of right tibia, sequela: Secondary | ICD-10-CM | POA: Diagnosis not present

## 2017-01-14 DIAGNOSIS — I5031 Acute diastolic (congestive) heart failure: Secondary | ICD-10-CM | POA: Diagnosis not present

## 2017-01-14 DIAGNOSIS — I50811 Acute right heart failure: Secondary | ICD-10-CM | POA: Diagnosis not present

## 2017-01-14 DIAGNOSIS — I482 Chronic atrial fibrillation: Secondary | ICD-10-CM | POA: Diagnosis not present

## 2017-01-14 DIAGNOSIS — Z7189 Other specified counseling: Secondary | ICD-10-CM | POA: Diagnosis not present

## 2017-01-14 DIAGNOSIS — R740 Nonspecific elevation of levels of transaminase and lactic acid dehydrogenase [LDH]: Secondary | ICD-10-CM | POA: Diagnosis present

## 2017-01-14 DIAGNOSIS — I2729 Other secondary pulmonary hypertension: Secondary | ICD-10-CM | POA: Diagnosis present

## 2017-01-14 DIAGNOSIS — R748 Abnormal levels of other serum enzymes: Secondary | ICD-10-CM | POA: Diagnosis not present

## 2017-01-14 DIAGNOSIS — G9340 Encephalopathy, unspecified: Secondary | ICD-10-CM | POA: Diagnosis not present

## 2017-01-14 DIAGNOSIS — I480 Paroxysmal atrial fibrillation: Secondary | ICD-10-CM | POA: Diagnosis not present

## 2017-01-14 DIAGNOSIS — E038 Other specified hypothyroidism: Secondary | ICD-10-CM | POA: Diagnosis not present

## 2017-01-14 DIAGNOSIS — Z7901 Long term (current) use of anticoagulants: Secondary | ICD-10-CM

## 2017-01-14 DIAGNOSIS — Z955 Presence of coronary angioplasty implant and graft: Secondary | ICD-10-CM

## 2017-01-14 DIAGNOSIS — E785 Hyperlipidemia, unspecified: Secondary | ICD-10-CM | POA: Diagnosis present

## 2017-01-14 DIAGNOSIS — I10 Essential (primary) hypertension: Secondary | ICD-10-CM | POA: Diagnosis not present

## 2017-01-14 DIAGNOSIS — N179 Acute kidney failure, unspecified: Secondary | ICD-10-CM | POA: Diagnosis present

## 2017-01-14 DIAGNOSIS — R404 Transient alteration of awareness: Secondary | ICD-10-CM | POA: Diagnosis not present

## 2017-01-14 DIAGNOSIS — S82201A Unspecified fracture of shaft of right tibia, initial encounter for closed fracture: Secondary | ICD-10-CM | POA: Diagnosis not present

## 2017-01-14 DIAGNOSIS — F419 Anxiety disorder, unspecified: Secondary | ICD-10-CM | POA: Diagnosis present

## 2017-01-14 DIAGNOSIS — I2510 Atherosclerotic heart disease of native coronary artery without angina pectoris: Secondary | ICD-10-CM | POA: Diagnosis not present

## 2017-01-14 DIAGNOSIS — K2270 Barrett's esophagus without dysplasia: Secondary | ICD-10-CM | POA: Diagnosis present

## 2017-01-14 DIAGNOSIS — I5082 Biventricular heart failure: Secondary | ICD-10-CM | POA: Diagnosis present

## 2017-01-14 DIAGNOSIS — Y95 Nosocomial condition: Secondary | ICD-10-CM | POA: Diagnosis present

## 2017-01-14 DIAGNOSIS — I5033 Acute on chronic diastolic (congestive) heart failure: Secondary | ICD-10-CM | POA: Diagnosis present

## 2017-01-14 DIAGNOSIS — R531 Weakness: Secondary | ICD-10-CM | POA: Diagnosis not present

## 2017-01-14 DIAGNOSIS — Z8249 Family history of ischemic heart disease and other diseases of the circulatory system: Secondary | ICD-10-CM

## 2017-01-14 DIAGNOSIS — R7989 Other specified abnormal findings of blood chemistry: Secondary | ICD-10-CM | POA: Diagnosis not present

## 2017-01-14 DIAGNOSIS — S82841B Displaced bimalleolar fracture of right lower leg, initial encounter for open fracture type I or II: Secondary | ICD-10-CM | POA: Diagnosis not present

## 2017-01-14 DIAGNOSIS — I081 Rheumatic disorders of both mitral and tricuspid valves: Secondary | ICD-10-CM | POA: Diagnosis present

## 2017-01-14 DIAGNOSIS — I50813 Acute on chronic right heart failure: Secondary | ICD-10-CM | POA: Diagnosis not present

## 2017-01-14 DIAGNOSIS — K7200 Acute and subacute hepatic failure without coma: Secondary | ICD-10-CM | POA: Diagnosis not present

## 2017-01-14 DIAGNOSIS — D649 Anemia, unspecified: Secondary | ICD-10-CM | POA: Diagnosis present

## 2017-01-14 DIAGNOSIS — R0602 Shortness of breath: Secondary | ICD-10-CM | POA: Diagnosis not present

## 2017-01-14 DIAGNOSIS — M6281 Muscle weakness (generalized): Secondary | ICD-10-CM | POA: Diagnosis not present

## 2017-01-14 DIAGNOSIS — T148XXA Other injury of unspecified body region, initial encounter: Secondary | ICD-10-CM | POA: Diagnosis not present

## 2017-01-14 DIAGNOSIS — I509 Heart failure, unspecified: Secondary | ICD-10-CM

## 2017-01-14 DIAGNOSIS — Z452 Encounter for adjustment and management of vascular access device: Secondary | ICD-10-CM | POA: Diagnosis not present

## 2017-01-14 DIAGNOSIS — R945 Abnormal results of liver function studies: Secondary | ICD-10-CM | POA: Diagnosis not present

## 2017-01-14 DIAGNOSIS — E871 Hypo-osmolality and hyponatremia: Secondary | ICD-10-CM | POA: Diagnosis not present

## 2017-01-14 DIAGNOSIS — J810 Acute pulmonary edema: Secondary | ICD-10-CM | POA: Diagnosis not present

## 2017-01-14 DIAGNOSIS — D508 Other iron deficiency anemias: Secondary | ICD-10-CM | POA: Diagnosis not present

## 2017-01-14 DIAGNOSIS — I272 Pulmonary hypertension, unspecified: Secondary | ICD-10-CM | POA: Diagnosis not present

## 2017-01-14 DIAGNOSIS — Z515 Encounter for palliative care: Secondary | ICD-10-CM | POA: Diagnosis not present

## 2017-01-14 DIAGNOSIS — I248 Other forms of acute ischemic heart disease: Secondary | ICD-10-CM | POA: Diagnosis present

## 2017-01-14 DIAGNOSIS — Z66 Do not resuscitate: Secondary | ICD-10-CM | POA: Diagnosis present

## 2017-01-14 DIAGNOSIS — D509 Iron deficiency anemia, unspecified: Secondary | ICD-10-CM | POA: Diagnosis present

## 2017-01-14 DIAGNOSIS — R112 Nausea with vomiting, unspecified: Secondary | ICD-10-CM | POA: Diagnosis present

## 2017-01-14 DIAGNOSIS — J9601 Acute respiratory failure with hypoxia: Secondary | ICD-10-CM | POA: Diagnosis present

## 2017-01-14 DIAGNOSIS — R778 Other specified abnormalities of plasma proteins: Secondary | ICD-10-CM | POA: Diagnosis present

## 2017-01-14 DIAGNOSIS — S82841D Displaced bimalleolar fracture of right lower leg, subsequent encounter for closed fracture with routine healing: Secondary | ICD-10-CM

## 2017-01-14 DIAGNOSIS — K7689 Other specified diseases of liver: Secondary | ICD-10-CM | POA: Diagnosis not present

## 2017-01-14 DIAGNOSIS — I130 Hypertensive heart and chronic kidney disease with heart failure and stage 1 through stage 4 chronic kidney disease, or unspecified chronic kidney disease: Secondary | ICD-10-CM | POA: Diagnosis present

## 2017-01-14 DIAGNOSIS — I4510 Unspecified right bundle-branch block: Secondary | ICD-10-CM | POA: Diagnosis present

## 2017-01-14 DIAGNOSIS — K219 Gastro-esophageal reflux disease without esophagitis: Secondary | ICD-10-CM | POA: Diagnosis not present

## 2017-01-14 DIAGNOSIS — I4581 Long QT syndrome: Secondary | ICD-10-CM | POA: Diagnosis present

## 2017-01-14 DIAGNOSIS — E039 Hypothyroidism, unspecified: Secondary | ICD-10-CM | POA: Diagnosis not present

## 2017-01-14 DIAGNOSIS — S82841S Displaced bimalleolar fracture of right lower leg, sequela: Secondary | ICD-10-CM | POA: Diagnosis not present

## 2017-01-14 DIAGNOSIS — Z79899 Other long term (current) drug therapy: Secondary | ICD-10-CM

## 2017-01-14 DIAGNOSIS — E860 Dehydration: Secondary | ICD-10-CM | POA: Diagnosis present

## 2017-01-14 DIAGNOSIS — D631 Anemia in chronic kidney disease: Secondary | ICD-10-CM | POA: Diagnosis present

## 2017-01-14 DIAGNOSIS — R0603 Acute respiratory distress: Secondary | ICD-10-CM | POA: Diagnosis not present

## 2017-01-14 DIAGNOSIS — R0600 Dyspnea, unspecified: Secondary | ICD-10-CM

## 2017-01-14 DIAGNOSIS — I959 Hypotension, unspecified: Secondary | ICD-10-CM | POA: Diagnosis present

## 2017-01-14 DIAGNOSIS — R197 Diarrhea, unspecified: Secondary | ICD-10-CM

## 2017-01-14 DIAGNOSIS — N401 Enlarged prostate with lower urinary tract symptoms: Secondary | ICD-10-CM | POA: Diagnosis present

## 2017-01-14 DIAGNOSIS — I25118 Atherosclerotic heart disease of native coronary artery with other forms of angina pectoris: Secondary | ICD-10-CM | POA: Diagnosis not present

## 2017-01-14 DIAGNOSIS — J189 Pneumonia, unspecified organism: Secondary | ICD-10-CM

## 2017-01-14 DIAGNOSIS — I360 Nonrheumatic tricuspid (valve) stenosis: Secondary | ICD-10-CM | POA: Diagnosis not present

## 2017-01-14 DIAGNOSIS — I4891 Unspecified atrial fibrillation: Secondary | ICD-10-CM | POA: Diagnosis present

## 2017-01-14 DIAGNOSIS — R1310 Dysphagia, unspecified: Secondary | ICD-10-CM | POA: Diagnosis not present

## 2017-01-14 DIAGNOSIS — I340 Nonrheumatic mitral (valve) insufficiency: Secondary | ICD-10-CM | POA: Diagnosis not present

## 2017-01-14 DIAGNOSIS — R262 Difficulty in walking, not elsewhere classified: Secondary | ICD-10-CM | POA: Diagnosis not present

## 2017-01-14 DIAGNOSIS — E876 Hypokalemia: Secondary | ICD-10-CM | POA: Diagnosis present

## 2017-01-14 DIAGNOSIS — N170 Acute kidney failure with tubular necrosis: Secondary | ICD-10-CM | POA: Diagnosis not present

## 2017-01-14 DIAGNOSIS — G4733 Obstructive sleep apnea (adult) (pediatric): Secondary | ICD-10-CM | POA: Diagnosis present

## 2017-01-14 DIAGNOSIS — G9341 Metabolic encephalopathy: Secondary | ICD-10-CM | POA: Diagnosis not present

## 2017-01-14 DIAGNOSIS — Z4789 Encounter for other orthopedic aftercare: Secondary | ICD-10-CM | POA: Diagnosis not present

## 2017-01-14 DIAGNOSIS — A419 Sepsis, unspecified organism: Secondary | ICD-10-CM | POA: Diagnosis not present

## 2017-01-14 DIAGNOSIS — I5042 Chronic combined systolic (congestive) and diastolic (congestive) heart failure: Secondary | ICD-10-CM | POA: Diagnosis not present

## 2017-01-14 DIAGNOSIS — I517 Cardiomegaly: Secondary | ICD-10-CM | POA: Diagnosis not present

## 2017-01-14 DIAGNOSIS — R05 Cough: Secondary | ICD-10-CM | POA: Diagnosis not present

## 2017-01-14 DIAGNOSIS — R4182 Altered mental status, unspecified: Secondary | ICD-10-CM | POA: Diagnosis not present

## 2017-01-14 DIAGNOSIS — N171 Acute kidney failure with acute cortical necrosis: Secondary | ICD-10-CM | POA: Diagnosis not present

## 2017-01-14 DIAGNOSIS — S82401S Unspecified fracture of shaft of right fibula, sequela: Secondary | ICD-10-CM | POA: Diagnosis not present

## 2017-01-14 LAB — COMPREHENSIVE METABOLIC PANEL
ALT: 338 U/L — AB (ref 17–63)
AST: 594 U/L — ABNORMAL HIGH (ref 15–41)
Albumin: 2.4 g/dL — ABNORMAL LOW (ref 3.5–5.0)
Alkaline Phosphatase: 136 U/L — ABNORMAL HIGH (ref 38–126)
Anion gap: 9 (ref 5–15)
BUN: 44 mg/dL — ABNORMAL HIGH (ref 6–20)
CHLORIDE: 97 mmol/L — AB (ref 101–111)
CO2: 22 mmol/L (ref 22–32)
CREATININE: 2.9 mg/dL — AB (ref 0.61–1.24)
Calcium: 7.8 mg/dL — ABNORMAL LOW (ref 8.9–10.3)
GFR, EST AFRICAN AMERICAN: 21 mL/min — AB (ref >60)
GFR, EST NON AFRICAN AMERICAN: 18 mL/min — AB (ref >60)
Glucose, Bld: 144 mg/dL — ABNORMAL HIGH (ref 65–99)
POTASSIUM: 4 mmol/L (ref 3.5–5.1)
Sodium: 128 mmol/L — ABNORMAL LOW (ref 135–145)
Total Bilirubin: 1.8 mg/dL — ABNORMAL HIGH (ref 0.3–1.2)
Total Protein: 5.6 g/dL — ABNORMAL LOW (ref 6.5–8.1)

## 2017-01-14 LAB — I-STAT ARTERIAL BLOOD GAS, ED
ACID-BASE DEFICIT: 7 mmol/L — AB (ref 0.0–2.0)
BICARBONATE: 15.5 mmol/L — AB (ref 20.0–28.0)
O2 Saturation: 94 %
PCO2 ART: 23.2 mmHg — AB (ref 32.0–48.0)
PO2 ART: 67 mmHg — AB (ref 83.0–108.0)
Patient temperature: 98.6
TCO2: 16 mmol/L (ref 0–100)
pH, Arterial: 7.432 (ref 7.350–7.450)

## 2017-01-14 LAB — PROCALCITONIN: Procalcitonin: 1.02 ng/mL

## 2017-01-14 LAB — CBC WITH DIFFERENTIAL/PLATELET
Basophils Absolute: 0 10*3/uL (ref 0.0–0.1)
Basophils Relative: 0 %
EOS ABS: 0 10*3/uL (ref 0.0–0.7)
Eosinophils Relative: 0 %
HCT: 27.4 % — ABNORMAL LOW (ref 39.0–52.0)
HEMOGLOBIN: 9.4 g/dL — AB (ref 13.0–17.0)
LYMPHS ABS: 0.9 10*3/uL (ref 0.7–4.0)
Lymphocytes Relative: 6 %
MCH: 31.1 pg (ref 26.0–34.0)
MCHC: 34.3 g/dL (ref 30.0–36.0)
MCV: 90.7 fL (ref 78.0–100.0)
Monocytes Absolute: 1.7 10*3/uL — ABNORMAL HIGH (ref 0.1–1.0)
Monocytes Relative: 11 %
NEUTROS PCT: 83 %
Neutro Abs: 12.3 10*3/uL — ABNORMAL HIGH (ref 1.7–7.7)
Platelets: 244 10*3/uL (ref 150–400)
RBC: 3.02 MIL/uL — AB (ref 4.22–5.81)
RDW: 13.9 % (ref 11.5–15.5)
WBC: 14.9 10*3/uL — AB (ref 4.0–10.5)

## 2017-01-14 LAB — TROPONIN I
TROPONIN I: 0.04 ng/mL — AB (ref <0.03)
TROPONIN I: 0.05 ng/mL — AB (ref <0.03)

## 2017-01-14 LAB — PROTIME-INR
INR: 2.57
Prothrombin Time: 28.1 seconds — ABNORMAL HIGH (ref 11.4–15.2)

## 2017-01-14 LAB — ACETAMINOPHEN LEVEL: Acetaminophen (Tylenol), Serum: <10 ug/mL — ABNORMAL LOW (ref 10–30)

## 2017-01-14 LAB — AMMONIA: Ammonia: <9 umol/L — ABNORMAL LOW (ref 9–35)

## 2017-01-14 LAB — LIPASE, BLOOD: Lipase: 19 U/L (ref 11–51)

## 2017-01-14 LAB — LACTIC ACID, PLASMA: Lactic Acid, Venous: 1.6 mmol/L (ref 0.5–1.9)

## 2017-01-14 LAB — I-STAT CG4 LACTIC ACID, ED: LACTIC ACID, VENOUS: 1.69 mmol/L (ref 0.5–1.9)

## 2017-01-14 LAB — OSMOLALITY: Osmolality: 283 mOsm/kg (ref 275–295)

## 2017-01-14 LAB — BRAIN NATRIURETIC PEPTIDE: B Natriuretic Peptide: 668.2 pg/mL — ABNORMAL HIGH (ref 0.0–100.0)

## 2017-01-14 MED ORDER — DM-GUAIFENESIN ER 30-600 MG PO TB12
1.0000 | ORAL_TABLET | Freq: Two times a day (BID) | ORAL | Status: DC
Start: 2017-01-14 — End: 2017-01-15
  Administered 2017-01-15: 1 via ORAL
  Filled 2017-01-14: qty 1

## 2017-01-14 MED ORDER — DILTIAZEM HCL ER COATED BEADS 180 MG PO CP24
180.0000 mg | ORAL_CAPSULE | Freq: Every day | ORAL | Status: DC
  Administered 2017-01-15 – 2017-01-16 (×2): 180 mg via ORAL
  Filled 2017-01-14 (×2): qty 1

## 2017-01-14 MED ORDER — NITROGLYCERIN 0.4 MG SL SUBL: 0.4000 mg | SUBLINGUAL_TABLET | SUBLINGUAL | Status: DC | PRN

## 2017-01-14 MED ORDER — HYPROMELLOSE (GONIOSCOPIC) 2.5 % OP SOLN: 1.0000 [drp] | Freq: Three times a day (TID) | OPHTHALMIC | Status: DC | PRN

## 2017-01-14 MED ORDER — VANCOMYCIN HCL 10 G IV SOLR
1500.0000 mg | INTRAVENOUS | Status: AC
  Administered 2017-01-14: 1500 mg via INTRAVENOUS
  Filled 2017-01-14: qty 1500

## 2017-01-14 MED ORDER — SODIUM CHLORIDE 0.9 % IV BOLUS (SEPSIS)
1000.0000 mL | Freq: Once | INTRAVENOUS | Status: AC
  Administered 2017-01-14: 1000 mL via INTRAVENOUS

## 2017-01-14 MED ORDER — CLONAZEPAM 0.5 MG PO TABS: 1.0000 mg | ORAL_TABLET | Freq: Every day | ORAL | Status: DC

## 2017-01-14 MED ORDER — VANCOMYCIN HCL IN DEXTROSE 1-5 GM/200ML-% IV SOLN: 1000.0000 mg | INTRAVENOUS | Status: DC

## 2017-01-14 MED ORDER — VANCOMYCIN HCL IN DEXTROSE 1-5 GM/200ML-% IV SOLN: 1000.0000 mg | Freq: Once | INTRAVENOUS | Status: DC

## 2017-01-14 MED ORDER — LEVALBUTEROL HCL 1.25 MG/0.5ML IN NEBU
1.2500 mg | INHALATION_SOLUTION | Freq: Four times a day (QID) | RESPIRATORY_TRACT | Status: DC
  Administered 2017-01-14 – 2017-01-15 (×3): 1.25 mg via RESPIRATORY_TRACT
  Filled 2017-01-14 (×4): qty 0.5

## 2017-01-14 MED ORDER — WARFARIN - PHARMACIST DOSING INPATIENT
Freq: Every day | Status: DC
Start: 2017-01-15 — End: 2017-01-25
  Administered 2017-01-16 – 2017-01-22 (×4)

## 2017-01-14 MED ORDER — PIPERACILLIN-TAZOBACTAM 3.375 G IVPB 30 MIN
3.3750 g | Freq: Once | INTRAVENOUS | Status: AC
  Administered 2017-01-14: 3.375 g via INTRAVENOUS
  Filled 2017-01-14: qty 50

## 2017-01-14 MED ORDER — PIPERACILLIN-TAZOBACTAM IN DEX 2-0.25 GM/50ML IV SOLN
2.2500 g | Freq: Four times a day (QID) | INTRAVENOUS | Status: DC
  Administered 2017-01-15 (×2): 2.25 g via INTRAVENOUS
  Filled 2017-01-14 (×4): qty 50

## 2017-01-14 MED ORDER — CLONAZEPAM 0.5 MG PO TABS
0.5000 mg | ORAL_TABLET | Freq: Every day | ORAL | Status: DC
  Administered 2017-01-15 – 2017-01-18 (×4): 0.5 mg via ORAL
  Filled 2017-01-14 (×4): qty 1

## 2017-01-14 MED ORDER — SODIUM CHLORIDE 0.9 % IV SOLN
INTRAVENOUS | Status: DC
  Administered 2017-01-14 – 2017-01-23 (×5): via INTRAVENOUS

## 2017-01-14 MED ORDER — LEVOTHYROXINE SODIUM 50 MCG PO TABS
50.0000 ug | ORAL_TABLET | ORAL | Status: DC
  Administered 2017-01-17 – 2017-01-19 (×2): 50 ug via ORAL
  Filled 2017-01-14 (×4): qty 1

## 2017-01-14 MED ORDER — HYDROCODONE-ACETAMINOPHEN 5-325 MG PO TABS: 1.0000 | ORAL_TABLET | ORAL | Status: DC | PRN

## 2017-01-14 MED ORDER — HYDROXYZINE HCL 50 MG/ML IM SOLN
25.0000 mg | Freq: Four times a day (QID) | INTRAMUSCULAR | Status: DC | PRN
  Filled 2017-01-14: qty 0.5

## 2017-01-14 MED ORDER — SACCHAROMYCES BOULARDII 250 MG PO CAPS
250.0000 mg | ORAL_CAPSULE | Freq: Two times a day (BID) | ORAL | Status: DC
  Administered 2017-01-15 – 2017-01-24 (×19): 250 mg via ORAL
  Filled 2017-01-14 (×19): qty 1

## 2017-01-14 MED ORDER — IBUPROFEN 200 MG PO TABS: 200.0000 mg | ORAL_TABLET | Freq: Four times a day (QID) | ORAL | Status: DC | PRN

## 2017-01-14 MED ORDER — OMEGA-3-ACID ETHYL ESTERS 1 G PO CAPS
1.0000 g | ORAL_CAPSULE | Freq: Every day | ORAL | Status: DC
Start: 2017-01-15 — End: 2017-01-25
  Administered 2017-01-16 – 2017-01-24 (×9): 1 g via ORAL
  Filled 2017-01-14 (×10): qty 1

## 2017-01-14 MED ORDER — ADULT MULTIVITAMIN W/MINERALS CH
1.0000 | ORAL_TABLET | Freq: Every day | ORAL | Status: DC
  Administered 2017-01-16 – 2017-01-24 (×9): 1 via ORAL
  Filled 2017-01-14 (×10): qty 1

## 2017-01-14 MED ORDER — PANTOPRAZOLE SODIUM 40 MG PO TBEC
40.0000 mg | DELAYED_RELEASE_TABLET | Freq: Two times a day (BID) | ORAL | Status: DC
  Administered 2017-01-15 – 2017-01-24 (×20): 40 mg via ORAL
  Filled 2017-01-14 (×20): qty 1

## 2017-01-14 MED ORDER — TRAZODONE HCL 50 MG PO TABS
50.0000 mg | ORAL_TABLET | Freq: Every evening | ORAL | Status: DC | PRN
  Administered 2017-01-15 – 2017-01-16 (×2): 50 mg via ORAL
  Filled 2017-01-14 (×2): qty 1

## 2017-01-14 MED ORDER — FINASTERIDE 5 MG PO TABS
5.0000 mg | ORAL_TABLET | Freq: Every day | ORAL | Status: DC
  Administered 2017-01-15 – 2017-01-24 (×10): 5 mg via ORAL
  Filled 2017-01-14 (×10): qty 1

## 2017-01-14 MED ORDER — CO Q-10 50 MG PO CAPS: 100.0000 mg | ORAL_CAPSULE | Freq: Every day | ORAL | Status: DC

## 2017-01-14 MED ORDER — WARFARIN SODIUM 2 MG PO TABS
2.0000 mg | ORAL_TABLET | Freq: Once | ORAL | Status: DC
  Filled 2017-01-14: qty 1

## 2017-01-14 MED ORDER — PRAVASTATIN SODIUM 40 MG PO TABS
40.0000 mg | ORAL_TABLET | Freq: Every evening | ORAL | Status: DC
  Administered 2017-01-15 – 2017-01-24 (×10): 40 mg via ORAL
  Filled 2017-01-14 (×5): qty 1
  Filled 2017-01-14: qty 2
  Filled 2017-01-14 (×4): qty 1

## 2017-01-14 MED ORDER — LEVOTHYROXINE SODIUM 75 MCG PO TABS
75.0000 ug | ORAL_TABLET | ORAL | Status: DC
  Administered 2017-01-16 – 2017-01-18 (×2): 75 ug via ORAL
  Filled 2017-01-14 (×2): qty 1

## 2017-01-14 MED ORDER — OXYCODONE HCL 5 MG PO TABS: 5.0000 mg | ORAL_TABLET | Freq: Four times a day (QID) | ORAL | Status: DC | PRN

## 2017-01-14 MED ORDER — LEVOTHYROXINE SODIUM 50 MCG PO TABS: 50.0000 ug | ORAL_TABLET | ORAL | Status: DC

## 2017-01-14 NOTE — Progress Notes (Signed)
Pharmacy Antibiotic Note  [name] is a 83 y.o. male admitted on 01/16/2017 with sepsis.  Pharmacy has been consulted for vancomycin and zosyn dosing. Tmax is 100.6 and WBC is elevated at 14.9. SCr is well above baseline at 2.9. Lactic acid is <2.   Plan: Vancomycin 1500mg  IV x 1 then 1gm IV Q48H Zosyn 3.375gm IV x 1 then 2.25gm IV Q6H F/u renal fxn, C&S, clinical status and trough at SS  Height: 6' (182.9 cm) Weight: 186 lb 1.1 oz (84.4 kg) IBW/kg (Calculated) : 77.6  Temp (24hrs), Avg:100.3 F (37.9 C), Min:100.3 F (37.9 C), Max:100.3 F (37.9 C)   Recent Labs Lab 01/08/17 0820 01/17/2017 1756 01/23/2017 1804  WBC 10.8* 14.9*  --   CREATININE 1.20  --   --   LATICACIDVEN  --   --  1.69    Estimated Creatinine Clearance: 47.6 mL/min (by C-G formula based on SCr of 1.2 mg/dL).    Allergies  Allergen Reactions  . Metoprolol Tartrate Other (See Comments)    Anxiety, heart rate goes too low, feels like in a fog.    Antimicrobials this admission: Vanc 7/1>> Zosyn 7/1>>  Dose adjustments this admission: N/A  Microbiology results: Pending  Thank you for allowing pharmacy to be a part of this patient's care.  [name], Rande Lawman 01/23/2017 6:28 PM

## 2017-01-14 NOTE — ED Notes (Signed)
Called Dr. Blaine Hamper to confirm if pt needs to go to CT now.  Sats in 80's on 55% venti mask.  Per Dr. Blaine Hamper CT is not stat, send when stable.  Per MD put pt back on non-rebreather mask.

## 2017-01-14 NOTE — H&P (Addendum)
History and Physical    [name] OXB:[id number] DOB: 02-04-1927 DOA: 02/07/2017  Referring MD/NP/PA:   PCP: Marletta Lor, MD   Patient coming from:  The patient is coming from rehab.  At baseline, pt is dependent for most of ADL.  Chief Complaint: confusion, cough, SOB, nausea, vomiting, diarrhea.  HPI: [name] is a 83 y.o. male with medical history significant of Hypertension, hyperlipidemia, GERD, hypothyroidism, depression, anxiety, OSA on CPAP, A. fib on Coumadin, popliteal artery embolism, MV valve regurgitation, CAD, s/p of stent placement, BPH, iron deficiency anemia, CKD-3, who presents with confusion, cough, shortness breath, nausea, vomiting and diarrhea.  Pt was recently hospitalized due to right tibia/fibular fracture from 6/22-6/25. Patient had surgery, and was discharged to rehabilitation facility. Per his son, patient become confused in the past 3 days. He is lethargic, and has poor appetite and decreased oral intake, generalized weakness, but no unilateral weakness, numbness or tingling to extremities. No slurred speech or facial droop. Patient has dry cough and shortness of breath, but no chest pain. Per his son. Pt has oxygen desaturation to 60-70%. He has fever and chills. Patient has nausea, and vomiting (once a day) since yesterday in the past 3 days. He also has loose stool, 1-2 times each day, but the patient is on MiraLAX. Patient had epigastric abdominal pain earlier, which has resolved. Currently no abdominal pain. Per his son, patient also has dysuria and urinary frequency, which he attributed to BPH.   ED Course: pt was found to have  WBC 14.9, hemoglobin 12.2 on 60/25/18-9.4, lactic acid 1.29, INR 2.55, troponin 0.05, BNP 668.2, sodium 128, worsening renal function, abnormal liver functions with AST 594, ALT 338, total bilirubin 1.8, ALP 176, temperature 100.3, tachycardia, tachypnea. CXR showed interstitial opacity, worse on the left. Pt is admitted to  SDU as inpt.  Review of Systems:   General: has fevers, chills, no changes in body weight, has poor appetite, has fatigue HEENT: no blurry vision, hearing changes or sore throat Respiratory: has dyspnea, coughing, no wheezing CV: no chest pain, no palpitations GI: has nausea, vomiting, abdominal pain, diarrhea, no constipation GU: has dysuria, burning on urination, increased urinary frequency, no  hematuria  Ext: no leg edema Neuro: no unilateral weakness, numbness, or tingling, no vision change or hearing loss. Has confusion. Skin: no rash, no skin tear. MSK: No muscle spasm, no deformity, no limitation of range of movement in spin Heme: No easy bruising.  Travel history: No recent long distant travel.  Allergy:  Allergies  Allergen Reactions  . Metoprolol Tartrate Other (See Comments)    Anxiety, heart rate goes too low, feels like in a fog.    Past Medical History:  Diagnosis Date  . Anemia   . Barrett's esophagus   . BENIGN PROSTATIC HYPERTROPHY   . Chronic anticoagulation    a. on Coumadin  . CORONARY ARTERY DISEASE    a. s/p stent OM 2002. b. s/p BMS 2006 RCA  c. s/p cath 2007.  [name] DEPRESSION   . DIVERTICULOSIS, COLON   . GERD   . GYNECOMASTIA, UNILATERAL   . Hematoma of abdominal wall   . History of echocardiogram    a. EF 55% (TEE 06/2014)  . HYPERLIPIDEMIA   . HYPERTENSION   . HYPOTHYROIDISM   . Mallory - Weiss tear    a. > 5 years ago  . Mitral regurgitation    a. Mild-mod by TEE 06/2014.  [name] Paroxysmal A-fib ([place])  a. chronic amiodarone and coumadin;  b. 05/2013 s/p DCCV. c. 06/2014 s/p TEE/DCCV.  [name] Popliteal artery embolism, right ([place])   . SLEEP APNEA    CPAP    Past Surgical History:  Procedure Laterality Date  . cardioversion  8/08  . CARDIOVERSION  09/14/2011   Procedure: CARDIOVERSION;  Surgeon: Josue Hector, MD;  Location: [place];  Service: Cardiovascular;  Laterality: N/A;  . CARDIOVERSION N/A 03/23/2014   Procedure: CARDIOVERSION;   Surgeon: Thompson Grayer, MD;  Location: [place];  Service: Cardiovascular;  Laterality: N/A;  . CARDIOVERSION N/A 06/22/2014   Procedure: CARDIOVERSION;  Surgeon: [name], MD;  Location: [facility] ENDOSCOPY;  Service: Cardiovascular;  Laterality: N/A;  . CARDIOVERSION N/A 04/27/2015   Procedure: CARDIOVERSION;  Surgeon: Josue Hector, MD;  Location: [facility] ENDOSCOPY;  Service: Cardiovascular;  Laterality: N/A;  . CARDIOVERSION N/A 07/16/2015   Procedure: CARDIOVERSION;  Surgeon: [name], MD;  Location: [place];  Service: Cardiovascular;  Laterality: N/A;  . CARDIOVERSION N/A 11/26/2015   Procedure: CARDIOVERSION;  Surgeon: Sanda Klein, MD;  Location: [facility] ENDOSCOPY;  Service: Cardiovascular;  Laterality: N/A;  . CARDIOVERSION N/A 05/17/2016   Procedure: CARDIOVERSION;  Surgeon: Thayer Headings, MD;  Location: [place];  Service: Cardiovascular;  Laterality: N/A;  . CARDIOVERSION N/A 09/20/2016   Procedure: CARDIOVERSION;  Surgeon: [name], MD;  Location: [facility] ENDOSCOPY;  Service: Cardiovascular;  Laterality: N/A;  . CARDIOVERSION N/A 11/09/2016   Procedure: CARDIOVERSION;  Surgeon: [name], MD;  Location: [facility] ENDOSCOPY;  Service: Cardiovascular;  Laterality: N/A;  . CORONARY ANGIOPLASTY WITH STENT PLACEMENT    . ESOPHAGOGASTRODUODENOSCOPY    . ORIF ANKLE FRACTURE Right 01/06/2017   Procedure: OPEN REDUCTION INTERNAL FIXATION (ORIF) ANKLE FRACTURE, RIGHT;  Surgeon: Mcarthur Rossetti, MD;  Location: [place];  Service: Orthopedics;  Laterality: Right;  . TEE WITH CARDIOVERSION  7/08  . TEE WITHOUT CARDIOVERSION  09/14/2011   Procedure: TRANSESOPHAGEAL ECHOCARDIOGRAM (TEE);  Surgeon: Josue Hector, MD;  Location[facility] END[facility]COPY;  Service: Cardiovascular;  Laterality: N/A;  . TEE WITHOUT CARDIOVERSION N/A 06/22/2014   Procedure: TRANSESOPHAGEAL ECHOCARDIOGRAM (TEE);  Surgeon: [name], MD;  Location: [facility] ENDOSCOPY;  Service: Cardiovascular;  Laterality: N/A;  . TEE  WITHOUT CARDIOVERSION N/A 05/17/2016   Procedure: TRANSESOPHAGEAL ECHOCARDIOGRAM (TEE);  Surgeon: Thayer Headings, MD;  Location: [place];  Service: Cardiovascular;  Laterality: N/A;  . TEE WITHOUT CARDIOVERSION N/A 09/20/2016   Procedure: TRANSESOPHAGEAL ECHOCARDIOGRAM (TEE);  Surgeon: [name], MD;  Location: [place];  Service: Cardiovascular;  Laterality: N/A;  . TRANSURETHRAL RESECTION OF PROSTATE      Social History:  reports that he has never smoked. He has never used smokeless tobacco. He reports that he does not drink alcohol or use drugs.  Family History:  Family History  Problem Relation Age of Onset  . Heart attack Mother   . Hypertension Other   . Heart disease Brother        CAD male 1st degree relative  . Stroke Neg Hx      Prior to Admission medications   Medication Sig Start Date End Date Taking? Authorizing Provider  clonazePAM (KLONOPIN) 1 MG tablet Take 1 tablet (1 mg total) by mouth at bedtime. 01/08/17   Reyne Dumas, MD  Coenzyme Q10 (CO Q-10 PO) Take 100 mg by mouth daily.    [provider]  DIGESTIVE ENZYMES PO Take 1 tablet by mouth 2 (two) times daily.     [provider]  diltiazem (CARDIZEM CD) 180 MG 24 hr capsule Take 1 capsule (180 mg total) by mouth daily. 11/15/16   Camnitz, Ocie Doyne, MD  docusate sodium (COLACE) 100 MG capsule Take 1 capsule (100 mg total) by mouth 2 (two) times daily. 01/08/17   Reyne Dumas, MD  enoxaparin (LOVENOX) 150 MG/ML injection Inject 0.8 mLs (120 mg total) into the skin daily. 01/08/17   Reyne Dumas, MD  finasteride (PROSCAR) 5 MG tablet Take 5 mg by mouth daily. 12/22/14   [provider]  fish oil-omega-3 fatty acids 1000 MG capsule Take 1 g by mouth 2 (two) times daily.     [provider]  HYDROcodone-acetaminophen (NORCO/VICODIN) 5-325 MG tablet Take 1-2 tablets by mouth every 4 (four) hours as needed for moderate pain. 01/07/17   Mcarthur Rossetti, MD    hydroxypropyl methylcellulose (ISOPTO TEARS) 2.5 % ophthalmic solution Place 1 drop into both eyes 3 (three) times daily as needed for dry eyes.     [provider]  levothyroxine (SYNTHROID, LEVOTHROID) 50 MCG tablet Take 50 mcg by mouth See admin instructions. On Tuesday thursdays and saturdays patient stats he takes a full tablet then take a half along with it on these days. 1 whole tablet rest of days    [provider]  Multiple Vitamin (MULTIVITAMIN) capsule Take 1 capsule by mouth daily.    [provider]  nitroGLYCERIN (NITROSTAT) 0.4 MG SL tablet Place 0.4 mg under the tongue every 5 (five) minutes as needed for chest pain.     [provider]  pantoprazole (PROTONIX) 40 MG tablet Take 40 mg by mouth 2 (two) times daily.     [provider]  polyethylene glycol (MIRALAX / GLYCOLAX) packet Take 17 g by mouth daily as needed for mild constipation. 01/08/17   Reyne Dumas, MD  pravastatin (PRAVACHOL) 40 MG tablet Take 40 mg by mouth every evening. 03/07/11   Imogene Burn, PA-C  traZODone (DESYREL) 50 MG tablet Take by mouth at bedtime as needed for sleep.     [provider]  warfarin (COUMADIN) 5 MG tablet Take 5 mg by mouth daily at 6 PM. Take 5mg  by mouth daily on Monday and friday. Take 2.5mg  by mouth daily on Tuesday, Wednesday, Thursday, Saturday, and Sunday.    [provider]    Physical Exam: Vitals:   01/28/2017 1846 01/28/2017 1848 02/11/2017 1852 01/17/2017 1900  BP:    109/85  Pulse: (!) 109 96 (!) 104 99  Resp: (!) 21 17 19 20   Temp:      TempSrc:      SpO2: (!) 86% (!) 88% 92% (!) 86%  Weight:      Height:       General: Not in acute distress HEENT:       Eyes: PERRL, EOMI, no scleral icterus.       ENT: No discharge from the ears and nose, no pharynx injection, no tonsillar enlargement.        Neck: No JVD, no bruit, no mass felt. Heme: No neck lymph node enlargement. Cardiac: S1/S2, RRR, No murmurs, No  gallops or rubs. Respiratory: Coarse breathing sound bilaterally. No rales, wheezing, rhonchi or rubs. GI: Soft, nondistended, nontender, no rebound pain, no organomegaly, BS present. GU: No hematuria Ext: No pitting leg edema bilaterally. 2+DP/PT pulse bilaterally. Right leg is wrapped up, s/p of surgery. Musculoskeletal: No joint deformities, No joint redness or warmth, no limitation of ROM in spin. Skin:  No rashes.  Neuro: confused, but knows his son and hospital, not oriented to time, cranial nerves II-XII grossly intact, moves all extremities normally.  Psych: Patient is not psychotic, no suicidal or hemocidal ideation.  Labs on Admission: I have personally reviewed following labs and imaging studies  CBC:  Recent Labs Lab 01/08/17 0820 02/13/2017 1756  WBC 10.8* 14.9*  NEUTROABS  --  12.3*  HGB 11.2* 9.4*  HCT 34.2* 27.4*  MCV 97.7 90.7  PLT 147* 427   Basic Metabolic Panel:  Recent Labs Lab 01/08/17 0820 02/13/2017 1756  NA 137 128*  K 4.3 4.0  CL 105 97*  CO2 25 22  GLUCOSE 129* 144*  BUN 18 44*  CREATININE 1.20 2.90*  CALCIUM 8.6* 7.8*   GFR: Estimated Creatinine Clearance: 19.7 mL/min (A) (by C-G formula based on SCr of 2.9 mg/dL (H)). Liver Function Tests:  Recent Labs Lab 01/08/17 0820 02/04/2017 1756  AST 33 594*  ALT 20 338*  ALKPHOS 65 136*  BILITOT 0.7 1.8*  PROT 5.9* 5.6*  ALBUMIN 3.1* 2.4*   No results for input(s): LIPASE, AMYLASE in the last 168 hours. No results for input(s): AMMONIA in the last 168 hours. Coagulation Profile:  Recent Labs Lab 01/08/17 0820 02/03/2017 1756  INR 1.44 2.57   Cardiac Enzymes:  Recent Labs Lab 01/22/2017 1756  TROPONINI 0.04*   BNP (last 3 results) No results for input(s): PROBNP in the last 8760 hours. HbA1C: No results for input(s): HGBA1C in the last 72 hours. CBG: No results for input(s): GLUCAP in the last 168 hours. Lipid Profile: No results for input(s): CHOL, HDL, LDLCALC, TRIG, CHOLHDL,  LDLDIRECT in the last 72 hours. Thyroid Function Tests: No results for input(s): TSH, T4TOTAL, FREET4, T3FREE, THYROIDAB in the last 72 hours. Anemia Panel: No results for input(s): VITAMINB12, FOLATE, FERRITIN, TIBC, IRON, RETICCTPCT in the last 72 hours. Urine analysis:    Component Value Date/Time   COLORURINE AMBER BIOCHEMICALS MAY BE AFFECTED BY COLOR (A) 11/10/2009 0122   APPEARANCEUR CLEAR 11/10/2009 0122   LABSPEC 1.024 11/10/2009 0122   PHURINE 6.5 11/10/2009 0122   GLUCOSEU NEGATIVE 11/10/2009 0122   HGBUR NEGATIVE 11/10/2009 0122   BILIRUBINUR SMALL (A) 11/10/2009 0122   KETONESUR 40 (A) 11/10/2009 0122   PROTEINUR 100 (A) 11/10/2009 0122   UROBILINOGEN 2.0 (H) 11/10/2009 0122   NITRITE NEGATIVE 11/10/2009 0122   LEUKOCYTESUR NEGATIVE 11/10/2009 0122   Sepsis Labs: @LABRCNTIP (procalcitonin:4,lacticidven:4) ) Recent Results (from the past 240 hour(s))  MRSA PCR Screening     Status: None   Collection Time: 01/05/17 11:51 PM  Result Value Ref Range Status   MRSA by PCR NEGATIVE NEGATIVE Final    Comment:        The GeneXpert MRSA Assay (FDA approved for NASAL specimens only), is one component of a comprehensive MRSA colonization surveillance program. It is not intended to diagnose MRSA infection nor to guide or monitor treatment for MRSA infections.   Blood Culture (routine x 2)     Status: None (Preliminary result)   Collection Time: 01/20/2017  5:36 PM  Result Value Ref Range Status   Specimen Description BLOOD LEFT FOREARM  Final   Special Requests IN PEDIATRIC BOTTLE Blood Culture adequate volume  Final   Culture PENDING  Incomplete   Report Status PENDING  Incomplete     Radiological Exams on Admission: Dg Chest Port 1 View  Result Date: 01/27/2017 CLINICAL DATA:  83 year old male with worsening shortness of breath for 4 days.  Right ankle surgery 2 weeks ago. Lethargy. Vomiting. EXAM: PORTABLE CHEST 1 VIEW COMPARISON:  Chest radiographs 07/21/2015.  FINDINGS: Portable AP upright view at 1827 hours. Mildly lower lung volumes. Chronic moderate cardiomegaly and tortuous thoracic aorta. Mediastinal contours appear stable. Calcified aortic atherosclerosis. Visualized tracheal air column is within normal limits. Chronic but increased bilateral pulmonary interstitial opacity, worse on the left. No pneumothorax, pleural effusion or consolidation. IMPRESSION: 1. Chronic but increased pulmonary interstitial opacity, worse on the left. Top differential considerations include asymmetric pulmonary edema and viral/atypical respiratory infection. 2. Moderate cardiomegaly appears stable since 2017. Electronically Signed   By: Genevie Ann M.D.   On: 01/17/2017 18:40     EKG: Independently reviewed.  Atrial fibrillation, QTC 505, low voltage, right bundle blockage, early R-wave progression   Assessment/Plan Principal Problem:   Sepsis ([place]) Active Problems:   Hypothyroidism   HLD (hyperlipidemia)   Essential hypertension   CAD (coronary artery disease)   GERD   Iron deficiency anemia   Chronic anticoagulation   Anemia   Atrial fibrillation with RVR (HCC)   Elevated troponin   Abnormal LFTs   Acute renal failure superimposed on stage 3 chronic kidney disease (HCC)   Anxiety   Acute metabolic encephalopathy   Nausea, vomiting, and diarrhea   Acute respiratory failure with hypoxia (H[facility])   Hyponatremia    Sepsis ([place]): Source of infection is not clear. Patient has cough and SOB. Chest x-ray showed atypical infiltration, indicating possible early stage of HCAP pneumonia. He also has symptoms of UTI, urinalysis pending.  - Will admit to SDU as inpt - IV Vancomycin and Zosyn - will not start azithromycin and doxycycline due to abnormal liver function - Mucinex for cough  - prn Xopenex Neb prn for SOB - Urine legionella and S. pneumococcal antigen - Follow up blood culture x2, sputum culture and respiratory virus pane - will get Procalcitonin and trend  lactic acid level per sepsis protocol - IVF: 3L of NS bolus in ED, followed by 75 mL per hour of NS  - f/u UA and Ux - it difficult to close medication for fever. Patient has abnormal liver function, we'll avoid Tylenol. Patient has worsening renal function, NSIADs is also not good choice. Will use low dose Ibuprofen 200 mg prn for fever and f/u renal fx closely  Acute respiratory failure with hypoxia: Possibly due to atypical lung infection as above -Continue antibiotics and breathing treatment as above -check ABG  Abnormal LFTs: Etiology is not clear. Possibly due to sepsis. -check lipase -hepatitis panel -US-abdomen -check ammonia level -switch Norco to oxycodone prn for pain. -avoid tylenol  Acute metabolic encephalopathy: Possibly multifactorial etiology, including hyponatremia, worsening renal function, sepsis, hypoxia. Patient has abnormal liver function, hepatic encephalopathy is a potential differential diagnosis. -Frequent neuro check -Follow-up ammonia level, if elevated we'll start lactulose. -get CT-head  Hyponatremia: likely due to poor oral intake and dehydration,  - Will check urine sodium, urine osmolality, serum osmolality. - check TSH - IVF: as above - check BMP q6h  Hypothyroidism: Last TSH was 4.36 on 09/28/16  -Continue home Synthroid  HLD: -on pravastatin  Essential hypertension: bp is soft -pt is on Cardizem which is for afib. Will put holding bp parameter  Hx of CAD and positive trop: s/p of stent. Trop 0.04. No CP. Likely due to demand ischemia secondary to sepsis. - cycle CE q6 x3 and repeat EKG in the am  - prn Nitroglycerin  -Pravastatin - Risk factor stratification: will check  FLP and A1C  - 2d echo  GERD: -Protonix  Iron deficiency anemia: hgb 11.2 on 01/08/17-->9.4 -check FOBT -f/u by CBC  Atrial Fibrillation: CHA2DS2-VASc Score is 4, needs oral anticoagulation. Patient is on Coumadin at home. INR is 2.55 on admission. Heart rate is  126-->90s. -continue cardizme  AoCKD-III: Baseline Cre is 1.0-1.2,  pt's Cre is 2.90 on admission. Likely due to prerenal secondary to dehydration - IVF as above - Check FeNa  - Follow up renal function by BMP  Anxiety:  -continue home clonopin, but deceased dose from 1 mg to 0.5 mg QHS    Nausea, vomiting, and diarrhea: Etiology is not clear. Patient has mild loose stool, which is likely due to MiraLAX use. Patient had abdominal pain earlier, which has resolved currently. Nausea and vomiting may be due to sepsis and abnormal liver function. -When necessary hydroxyzine for nausea (patient had prolonged QTC, cannot to use Zofran) -Check lipase -Hold MiraLAX -IV fluid as above -If patient continues to have diarrhea, will check C. difficile PCR   OSA: -CPAP  DVT ppx: on Coumadin Code Status: DNR (I discussed with pt and patient's son and wife, and explained the meaning of CODE STATUS. Patient would want to be DNR) Family Communication: Yes, patient's son and wife at bed side Disposition Plan:  Anticipate discharge back to previous rehab environment Consults called:  none Admission status:  SDU/inpation       Date of Service 01/20/2017    Ivor Costa [facility] Pager [phone]  If 7PM-7AM, please contact night-coverage [email or web address] Password [facility] 01/15/2017, 8:17 PM

## 2017-01-14 NOTE — ED Triage Notes (Signed)
To room via EMS.  Pt from [facility] for right ankle injury.  Pt's wife and son called EMS d/t last several days pt lethargic, weak.  Vomited yesterday, diarrhea this morning.  EMS unable to get O2 sat, placed pt on 4 L O2 via [place].  Pt reports he "feels fuzzy".  C/o right and left lower abd.  EMS gave IVF 500 ml for BP 95/62, last BP 109/72.  EKG A fib.

## 2017-01-14 NOTE — ED Notes (Signed)
Transporter here to take pt to u/s.  Asked Dr. Blaine Hamper if this needs to be done at this time.  Per Dr. Blaine Hamper not urgent, send when pt is stable.

## 2017-01-14 NOTE — ED Notes (Signed)
Pt unable to urinate at this time.  

## 2017-01-14 NOTE — Progress Notes (Signed)
ANTICOAGULATION CONSULT NOTE - Initial Consult  Pharmacy Consult for warfarin Indication: atrial fibrillation  Allergies  Allergen Reactions  . Metoprolol Tartrate Other (See Comments)    Anxiety, heart rate goes too low, feels like in a fog.    Patient Measurements: Height: 6' (182.9 cm) Weight: 186 lb 1.1 oz (84.4 kg) IBW/kg (Calculated) : 77.6  Vital Signs: Temp: 100.3 F (37.9 C) (07/01 1659) Temp Source: Rectal (07/01 1659) BP: 109/85 (07/01 1900) Pulse Rate: 99 (07/01 1900)  Labs:  Recent Labs  01/26/2017 1756  HGB 9.4*  HCT 27.4*  PLT 244  LABPROT 28.1*  INR 2.57  CREATININE 2.90*  TROPONINI 0.04*    Estimated Creatinine Clearance: 19.7 mL/min (A) (by C-G formula based on SCr of 2.9 mg/dL (H)).  Medications:  Warfarin PTA 2mg  daily  Assessment: 45 yom presented to the ED with lethargy and weakness. On chronic coumadin for history of afib. INR is therapeutic at 2.57. H/H slightly low and platelets are WNL. No bleeding noted. Of note, pt with elevated LFTs which may impact warfarin dosing.   Goal of Therapy:  INR 2-3 Monitor platelets by anticoagulation protocol: Yes   Plan:  Warfarin 2mg  PO x 1 tonight Daily INR  Amahia Madonia, Rande Lawman 02/09/2017,8:05 PM

## 2017-01-14 NOTE — ED Notes (Signed)
Report attempted 

## 2017-01-14 NOTE — ED Provider Notes (Signed)
[facility] DEPT Provider Note   CSN: [id number] Arrival date & time: 01/26/2017  1621     History   Chief Complaint Chief Complaint  Patient presents with  . Altered Mental Status    HPI [name] is a 83 y.o. male.  83 year old male presents from [facility] complaining of cough and shortness of breath as well as increasing lethargy 2 days. Patient has had nausea with intermittent vomiting. Had actually temperature of 99.8. Denies any diarrhea. No abdominal discomfort. Family states that he has been less responsive. Was initially hypotensive and was treated with IV fluids and blood pressure normalized. Has a history of chronic A. fib. Had a fall several days ago and fell onto his right rib cage but did not strike his head. He is currently on Coumadin for his chronic A. fib. Symptoms have been progressively worse and no treatment use prior to arrival.      Past Medical History:  Diagnosis Date  . Anemia   . Barrett's esophagus   . BENIGN PROSTATIC HYPERTROPHY   . Chronic anticoagulation    a. on Coumadin  . CORONARY ARTERY DISEASE    a. s/p stent OM 2002. b. s/p BMS 2006 RCA  c. s/p cath 2007.  [name] DEPRESSION   . DIVERTICULOSIS, COLON   . GERD   . GYNECOMASTIA, UNILATERAL   . Hematoma of abdominal wall   . History of echocardiogram    a. EF 55% (TEE 06/2014)  . HYPERLIPIDEMIA   . HYPERTENSION   . HYPOTHYROIDISM   . Mallory - Weiss tear    a. > 5 years ago  . Mitral regurgitation    a. Mild-mod by TEE 06/2014.  [name] Paroxysmal A-fib (HCC)    a. chronic amiodarone and coumadin;  b. 05/2013 s/p DCCV. c. 06/2014 s/p TEE/DCCV.  [name] Popliteal artery embolism, right ([place])   . SLEEP APNEA    CPAP    Patient Active Problem List   Diagnosis Date Noted  . Bimalleolar ankle fracture, right, closed, initial encounter   . Tibia/fibula fracture, right, closed, initial encounter 01/05/2017  . Permanent atrial fibrillation ([place]) 12/28/2016  . Atrial fibrillation with RVR ([place])   .  Persistent atrial fibrillation ([employer]) 11/26/2015  . Anemia   . Fatigue 06/26/2014  . Chronic anticoagulation   . Iron deficiency anemia 01/21/2014  . Encounter for therapeutic drug monitoring 09/10/2013  . Paroxysmal a-fib ([place]), Rapid Ventricular Response   . Insomnia 11/25/2012  . Parasomnia 11/04/2012  . Long term (current) use of anticoagulants - Coumadin 09/01/2011  . GYNECOMASTIA, UNILATERAL 03/23/2010  . WEAKNESS 11/09/2009  . Backache 09/16/2009  . HLD (hyperlipidemia) 10/20/2008  . Mitral regurgitation 10/20/2008  . DEPRESSION 12/17/2007  . Hypothyroidism 05/20/2007  . OSA (obstructive sleep apnea) 05/20/2007  . Essential hypertension 04/10/2007  . CAD (coronary artery disease) 04/10/2007  . GERD 04/10/2007  . DIVERTICULOSIS, COLON 04/10/2007  . BENIGN PROSTATIC HYPERTROPHY 04/10/2007    Past Surgical History:  Procedure Laterality Date  . cardioversion  8/08  . CARDIOVERSION  09/14/2011   Procedure: CARDIOVERSION;  Surgeon: Josue Hector, MD;  Location: [facility] ENDOSCOPY;  Service: Cardiovascular;  Laterality: N/A;  . CARDIOVERSION N/A 03/23/2014   Procedure: CARDIOVERSION;  Surgeon: Thompson Grayer, MD;  Location: [place];  Service: Cardiovascular;  Laterality: N/A;  . CARDIOVERSION N/A 06/22/2014   Procedure: CARDIOVERSION;  Surgeon: [name], MD;  Location: [facility] ENDOSCOPY;  Service: Cardiovascular;  Laterality: N/A;  . CARDIOVERSION N/A 04/27/2015   Procedure: CARDIOVERSION;  Surgeon: Josue Hector, MD;  Location: [facility] ENDOSCOPY;  Service: Cardiovascular;  Laterality: N/A;  . CARDIOVERSION N/A 07/16/2015   Procedure: CARDIOVERSION;  Surgeon: [name], MD;  Location: [place];  Service: Cardiovascular;  Laterality: N/A;  . CARDIOVERSION N/A 11/26/2015   Procedure: CARDIOVERSION;  Surgeon: Sanda Klein, MD;  Location: [facility] ENDOSCOPY;  Service: Cardiovascular;  Laterality: N/A;  . CARDIOVERSION N/A 05/17/2016   Procedure: CARDIOVERSION;  Surgeon: Thayer Headings,  MD;  Location: [place];  Service: Cardiovascular;  Laterality: N/A;  . CARDIOVERSION N/A 09/20/2016   Procedure: CARDIOVERSION;  Surgeon: [name], MD;  Location: [facility] ENDOSCOPY;  Service: Cardiovascular;  Laterality: N/A;  . CARDIOVERSION N/A 11/09/2016   Procedure: CARDIOVERSION;  Surgeon: [name], MD;  Location: [facility] ENDOSCOPY;  Service: Cardiovascular;  Laterality: N/A;  . CORONARY ANGIOPLASTY WITH STENT PLACEMENT    . ESOPHAGOGASTRODUODENOSCOPY    . ORIF ANKLE FRACTURE Right 01/06/2017   Procedure: OPEN REDUCTION INTERNAL FIXATION (ORIF) ANKLE FRACTURE, RIGHT;  Surgeon: Mcarthur Rossetti, MD;  Location: [place];  Service: Orthopedics;  Laterality: Right;  . TEE WITH CARDIOVERSION  7/08  . TEE WITHOUT CARDIOVERSION  09/14/2011   Procedure: TRANSESOPHAGEAL ECHOCARDIOGRAM (TEE);  Surgeon: Josue Hector, MD;  Location: [facility] ENDOSCOPY;  Service: Cardiovascular;  Laterality: N/A;  . TEE WITHOUT CARDIOVERSION N/A 06/22/2014   Procedure: TRANSESOPHAGEAL ECHOCARDIOGRAM (TEE);  Surgeon: [name], MD;  Location: [facility] ENDOSCOPY;  Service: Cardiovascular;  Laterality: N/A;  . TEE WITHOUT CARDIOVERSION N/A 05/17/2016   Procedure: TRANSESOPHAGEAL ECHOCARDIOGRAM (TEE);  Surgeon: Thayer Headings, MD;  Location: [place];  Service: Cardiovascular;  Laterality: N/A;  . TEE WITHOUT CARDIOVERSION N/A 09/20/2016   Procedure: TRANSESOPHAGEAL ECHOCARDIOGRAM (TEE);  Surgeon: [name], MD;  Location: [place];  Service: Cardiovascular;  Laterality: N/A;  . TRANSURETHRAL RESECTION OF PROSTATE         Home Medications    Prior to Admission medications   Medication Sig Start Date End Date Taking? Authorizing Provider  clonazePAM (KLONOPIN) 1 MG tablet Take 1 tablet (1 mg total) by mouth at bedtime. 01/08/17   Reyne Dumas, MD  Coenzyme Q10 (CO Q-10 PO) Take 100 mg by mouth daily.    [provider]  DIGESTIVE ENZYMES PO Take 1 tablet by mouth 2 (two) times daily.      [provider]  diltiazem (CARDIZEM CD) 180 MG 24 hr capsule Take 1 capsule (180 mg total) by mouth daily. 11/15/16   Camnitz, Ocie Doyne, MD  docusate sodium (COLACE) 100 MG capsule Take 1 capsule (100 mg total) by mouth 2 (two) times daily. 01/08/17   Reyne Dumas, MD  enoxaparin (LOVENOX) 150 MG/ML injection Inject 0.8 mLs (120 mg total) into the skin daily. 01/08/17   Reyne Dumas, MD  finasteride (PROSCAR) 5 MG tablet Take 5 mg by mouth daily. 12/22/14   [provider]  fish oil-omega-3 fatty acids 1000 MG capsule Take 1 g by mouth 2 (two) times daily.     [provider]  HYDROcodone-acetaminophen (NORCO/VICODIN) 5-325 MG tablet Take 1-2 tablets by mouth every 4 (four) hours as needed for moderate pain. 01/07/17   Mcarthur Rossetti, MD  hydroxypropyl methylcellulose (ISOPTO TEARS) 2.5 % ophthalmic solution Place 1 drop into both eyes 3 (three) times daily as needed for dry eyes.     [provider]  levothyroxine (SYNTHROID, LEVOTHROID) 50 MCG tablet Take 50 mcg by mouth See admin instructions. On Tuesday thursdays and saturdays patient stats he  takes a full tablet then take a half along with it on these days. 1 whole tablet rest of days    [provider]  Multiple Vitamin (MULTIVITAMIN) capsule Take 1 capsule by mouth daily.    [provider]  nitroGLYCERIN (NITROSTAT) 0.4 MG SL tablet Place 0.4 mg under the tongue every 5 (five) minutes as needed for chest pain.     [provider]  pantoprazole (PROTONIX) 40 MG tablet Take 40 mg by mouth 2 (two) times daily.     [provider]  polyethylene glycol (MIRALAX / GLYCOLAX) packet Take 17 g by mouth daily as needed for mild constipation. 01/08/17   Reyne Dumas, MD  pravastatin (PRAVACHOL) 40 MG tablet Take 40 mg by mouth every evening. 03/07/11   Imogene Burn, PA-C  traZODone (DESYREL) 50 MG tablet Take by mouth at bedtime as needed for sleep.     [provider]  warfarin (COUMADIN) 5 MG tablet Take 5 mg by mouth daily at 6 PM. Take 5mg  by mouth daily on Monday and friday. Take 2.5mg  by mouth daily on Tuesday, Wednesday, Thursday, Saturday, and Sunday.    [provider]    Family History Family History  Problem Relation Age of Onset  . Heart attack Mother   . Hypertension Other   . Heart disease Brother        CAD male 1st degree relative  . Stroke Neg Hx     Social History Social History  Substance Use Topics  . Smoking status: Never Smoker  . Smokeless tobacco: Never Used  . Alcohol use No     Allergies   Metoprolol tartrate   Review of Systems Review of Systems  All other systems reviewed and are negative.    Physical Exam Updated Vital Signs BP 91/65   Pulse (!) 110   Temp 100.3 F (37.9 C) (Rectal)   Resp (!) 24   SpO2 98% Comment: O2 sats initially 50's, placed on oxygen titrated to 6L, O2 sats 80's, placed on nonrebreather  Physical Exam  Constitutional: He is oriented to person, place, and time. He appears well-developed and well-nourished.  Non-toxic appearance. No distress.  HENT:  Head: Normocephalic and atraumatic.  Eyes: Conjunctivae, EOM and lids are normal. Pupils are equal, round, and reactive to light.  Neck: Normal range of motion. Neck supple. No tracheal deviation present. No thyroid mass present.  Cardiovascular: Normal rate, regular rhythm and normal heart sounds.  Exam reveals no gallop.   No murmur heard. Pulmonary/Chest: No stridor. Tachypnea noted. He is in respiratory distress. He has decreased breath sounds. He has no wheezes. He has rhonchi. He has no rales.    Patient has ecchymosis noted on his left rib cage lateral aspect without crepitus. Nontender on his abdominal exam and right upper quadrant or and also left upper quadrant.  Abdominal: Soft. Normal appearance and bowel sounds are normal. He exhibits no distension. There is no tenderness. There is no rigidity,  no rebound, no guarding and no CVA tenderness.  Musculoskeletal: Normal range of motion. He exhibits no edema or tenderness.       Feet:  Neurological: He is alert and oriented to person, place, and time. He has normal strength. No cranial nerve deficit or sensory deficit. GCS eye subscore is 4. GCS verbal subscore is 5. GCS motor subscore is 6.  Skin: Skin is warm and dry. No abrasion and no rash noted.  Psychiatric: His affect is blunt.  Nursing  note and vitals reviewed.    ED Treatments / Results  Labs (all labs ordered are listed, but only abnormal results are displayed) Labs Reviewed  CULTURE, BLOOD (ROUTINE X 2)  CULTURE, BLOOD (ROUTINE X 2)  COMPREHENSIVE METABOLIC PANEL  CBC WITH DIFFERENTIAL/PLATELET  URINALYSIS, ROUTINE W REFLEX MICROSCOPIC  TROPONIN I  BRAIN NATRIURETIC PEPTIDE  PROTIME-INR  I-STAT CG4 LACTIC ACID, ED    EKG  EKG Interpretation None       Radiology No results found.  Procedures Procedures (including critical care time)  Medications Ordered in ED Medications - No data to display   Initial Impression / Assessment and Plan / ED Course  I have reviewed the triage vital signs and the nursing notes.  Pertinent labs & imaging results that were available during my care of the patient were reviewed by me and considered in my medical decision making (see chart for details).     Patients blood pressure noted and tx w/ iv fluids Has evidence of aki Code sepsis called and iv abx started Patient is mentating appropriately, has been assessed multiple times. His troponin was noted to be elevated likely from demand ischemia.  D/w hospitalist and pt to be admitted  CRITICAL CARE Performed by: Leota Jacobsen Total critical care time: 60 minutes Critical care time was exclusive of separately billable procedures and treating other patients. Critical care was necessary to treat or prevent imminent or life-threatening deterioration. Critical care  was time spent personally by me on the following activities: development of treatment plan with patient and/or surrogate as well as nursing, discussions with consultants, evaluation of patient's response to treatment, examination of patient, obtaining history from patient or surrogate, ordering and performing treatments and interventions, ordering and review of laboratory studies, ordering and review of radiographic studies, pulse oximetry and re-evaluation of patient's condition.   Final Clinical Impressions(s) / ED Diagnoses   Final diagnoses:  None    New Prescriptions New Prescriptions   No medications on file     Lacretia Leigh, MD 01/19/2017 1930

## 2017-01-15 ENCOUNTER — Inpatient Hospital Stay (HOSPITAL_COMMUNITY): Payer: Medicare Other

## 2017-01-15 ENCOUNTER — Other Ambulatory Visit (HOSPITAL_COMMUNITY): Payer: Medicare Other

## 2017-01-15 LAB — RESPIRATORY PANEL BY PCR
ADENOVIRUS-RVPPCR: NOT DETECTED
BORDETELLA PERTUSSIS-RVPCR: NOT DETECTED
CHLAMYDOPHILA PNEUMONIAE-RVPPCR: NOT DETECTED
CORONAVIRUS NL63-RVPPCR: NOT DETECTED
Coronavirus 229E: NOT DETECTED
Coronavirus HKU1: NOT DETECTED
Coronavirus OC43: NOT DETECTED
INFLUENZA A-RVPPCR: NOT DETECTED
Influenza B: NOT DETECTED
Metapneumovirus: NOT DETECTED
Mycoplasma pneumoniae: NOT DETECTED
PARAINFLUENZA VIRUS 3-RVPPCR: NOT DETECTED
Parainfluenza Virus 1: NOT DETECTED
Parainfluenza Virus 2: NOT DETECTED
Parainfluenza Virus 4: NOT DETECTED
RHINOVIRUS / ENTEROVIRUS - RVPPCR: NOT DETECTED
Respiratory Syncytial Virus: NOT DETECTED

## 2017-01-15 LAB — COMPREHENSIVE METABOLIC PANEL
ALK PHOS: 151 U/L — AB (ref 38–126)
ALT: 271 U/L — AB (ref 17–63)
AST: 215 U/L — AB (ref 15–41)
Albumin: 2.3 g/dL — ABNORMAL LOW (ref 3.5–5.0)
Anion gap: 13 (ref 5–15)
BILIRUBIN TOTAL: 1.8 mg/dL — AB (ref 0.3–1.2)
BUN: 37 mg/dL — AB (ref 6–20)
CHLORIDE: 101 mmol/L (ref 101–111)
CO2: 18 mmol/L — ABNORMAL LOW (ref 22–32)
CREATININE: 1.81 mg/dL — AB (ref 0.61–1.24)
Calcium: 7.9 mg/dL — ABNORMAL LOW (ref 8.9–10.3)
GFR calc Af Amer: 37 mL/min — ABNORMAL LOW (ref >60)
GFR calc non Af Amer: 32 mL/min — ABNORMAL LOW (ref >60)
Glucose, Bld: 147 mg/dL — ABNORMAL HIGH (ref 65–99)
Potassium: 3.8 mmol/L (ref 3.5–5.1)
Sodium: 132 mmol/L — ABNORMAL LOW (ref 135–145)
TOTAL PROTEIN: 6.6 g/dL (ref 6.5–8.1)

## 2017-01-15 LAB — BASIC METABOLIC PANEL
Anion gap: 9 (ref 5–15)
BUN: 43 mg/dL — ABNORMAL HIGH (ref 6–20)
CHLORIDE: 101 mmol/L (ref 101–111)
CO2: 17 mmol/L — ABNORMAL LOW (ref 22–32)
Calcium: 7.6 mg/dL — ABNORMAL LOW (ref 8.9–10.3)
Creatinine, Ser: 2.4 mg/dL — ABNORMAL HIGH (ref 0.61–1.24)
GFR calc Af Amer: 26 mL/min — ABNORMAL LOW (ref >60)
GFR calc non Af Amer: 23 mL/min — ABNORMAL LOW (ref >60)
GLUCOSE: 126 mg/dL — AB (ref 65–99)
POTASSIUM: 3.8 mmol/L (ref 3.5–5.1)
Sodium: 127 mmol/L — ABNORMAL LOW (ref 135–145)

## 2017-01-15 LAB — LIPID PANEL
CHOL/HDL RATIO: 2.9 ratio
CHOLESTEROL: 85 mg/dL (ref 0–200)
HDL: 29 mg/dL — AB (ref >40)
LDL CALC: 47 mg/dL (ref 0–99)
TRIGLYCERIDES: 43 mg/dL (ref <150)
VLDL: 9 mg/dL (ref 0–40)

## 2017-01-15 LAB — OSMOLALITY, URINE: Osmolality, Ur: 533 mOsm/kg (ref 300–900)

## 2017-01-15 LAB — URINALYSIS, ROUTINE W REFLEX MICROSCOPIC
Glucose, UA: NEGATIVE mg/dL
Hgb urine dipstick: NEGATIVE
KETONES UR: NEGATIVE mg/dL
Leukocytes, UA: NEGATIVE
Nitrite: NEGATIVE
PH: 5 (ref 5.0–8.0)
Protein, ur: 30 mg/dL — AB
Specific Gravity, Urine: 1.024 (ref 1.005–1.030)

## 2017-01-15 LAB — TSH: TSH: 0.789 u[IU]/mL (ref 0.350–4.500)

## 2017-01-15 LAB — STREP PNEUMONIAE URINARY ANTIGEN: STREP PNEUMO URINARY ANTIGEN: NEGATIVE

## 2017-01-15 LAB — SODIUM, URINE, RANDOM: Sodium, Ur: 12 mmol/L

## 2017-01-15 LAB — PROTIME-INR
INR: 2.48
Prothrombin Time: 27.3 seconds — ABNORMAL HIGH (ref 11.4–15.2)

## 2017-01-15 LAB — LACTIC ACID, PLASMA: Lactic Acid, Venous: 1.9 mmol/L (ref 0.5–1.9)

## 2017-01-15 LAB — TROPONIN I
Troponin I: 0.04 ng/mL (ref <0.03)
Troponin I: 0.1 ng/mL (ref <0.03)

## 2017-01-15 LAB — MRSA PCR SCREENING: MRSA by PCR: NEGATIVE

## 2017-01-15 LAB — CREATININE, URINE, RANDOM: Creatinine, Urine: 233 mg/dL

## 2017-01-15 MED ORDER — DM-GUAIFENESIN ER 30-600 MG PO TB12
1.0000 | ORAL_TABLET | Freq: Two times a day (BID) | ORAL | Status: DC | PRN
  Administered 2017-01-17 – 2017-01-21 (×2): 1 via ORAL
  Filled 2017-01-15 (×2): qty 1

## 2017-01-15 MED ORDER — SODIUM CHLORIDE 0.9 % IV BOLUS (SEPSIS)
250.0000 mL | Freq: Once | INTRAVENOUS | Status: AC
  Administered 2017-01-15: 250 mL via INTRAVENOUS

## 2017-01-15 MED ORDER — WARFARIN SODIUM 2 MG PO TABS
2.0000 mg | ORAL_TABLET | Freq: Once | ORAL | Status: AC
  Administered 2017-01-15: 2 mg via ORAL
  Filled 2017-01-15: qty 1

## 2017-01-15 MED ORDER — LEVALBUTEROL HCL 1.25 MG/0.5ML IN NEBU
1.2500 mg | INHALATION_SOLUTION | Freq: Four times a day (QID) | RESPIRATORY_TRACT | Status: DC | PRN
  Administered 2017-01-18: 1.25 mg via RESPIRATORY_TRACT
  Filled 2017-01-15: qty 0.5

## 2017-01-15 MED ORDER — MORPHINE SULFATE (PF) 2 MG/ML IV SOLN
1.0000 mg | INTRAVENOUS | Status: DC | PRN
  Administered 2017-01-22: 1 mg via INTRAVENOUS
  Filled 2017-01-15: qty 1

## 2017-01-15 MED ORDER — PIPERACILLIN-TAZOBACTAM 3.375 G IVPB
3.3750 g | Freq: Three times a day (TID) | INTRAVENOUS | Status: DC
  Filled 2017-01-15: qty 50

## 2017-01-15 MED ORDER — PIPERACILLIN-TAZOBACTAM 3.375 G IVPB
3.3750 g | Freq: Three times a day (TID) | INTRAVENOUS | Status: DC
  Administered 2017-01-15 – 2017-01-19 (×12): 3.375 g via INTRAVENOUS
  Filled 2017-01-15 (×14): qty 50

## 2017-01-15 MED ORDER — OXYCODONE HCL 5 MG PO TABS
5.0000 mg | ORAL_TABLET | Freq: Four times a day (QID) | ORAL | Status: DC | PRN
  Administered 2017-01-16: 5 mg via ORAL
  Filled 2017-01-15: qty 1

## 2017-01-15 MED ORDER — IBUPROFEN 200 MG PO TABS
200.0000 mg | ORAL_TABLET | Freq: Four times a day (QID) | ORAL | Status: DC | PRN
  Administered 2017-01-15 – 2017-01-17 (×4): 200 mg via ORAL
  Filled 2017-01-15 (×4): qty 1

## 2017-01-15 MED ORDER — MORPHINE SULFATE (PF) 2 MG/ML IV SOLN
1.0000 mg | INTRAVENOUS | Status: DC | PRN
  Administered 2017-01-15: 1 mg via INTRAVENOUS
  Filled 2017-01-15: qty 1

## 2017-01-15 NOTE — Discharge Instructions (Signed)

## 2017-01-15 NOTE — Progress Notes (Signed)
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 Wife and adult son in rm. Provided emotional/spiritual support and prayer to all, then met w/ wife and son to explain advanced directive (AD) process/substance in conference rm as pt slept. Per son, pt, son, and doctor discussed pt's desire to be DNR in ED last night. Pt may or may not be alert enough in morning to execute AD; if so, they will ask nurse to call chaplain.   01/15/17 1600  Clinical Encounter Type  Visited With Patient and family together;Health care provider  Visit Type Initial;Psychological support;Spiritual support;Social support  Referral From Nurse  Spiritual Encounters  Spiritual Needs Prayer;Emotional  Stress Factors  Patient Stress Factors Health changes;Loss of control  Family Stress Factors Family relationships;Health changes;Loss of control   Gerrit Heck, Chaplain

## 2017-01-15 NOTE — Progress Notes (Signed)
ANTICOAGULATION CONSULT NOTE - Follow up [facility] for warfarin Indication: atrial fibrillation  Allergies  Allergen Reactions  . Metoprolol Tartrate Other (See Comments)    Anxiety, heart rate goes too low, feels like in a fog.    Patient Measurements: Height: 5\' 11"  (180.3 cm) Weight: 198 lb (89.8 kg) IBW/kg (Calculated) : 75.3  Vital Signs: Temp: 98.3 F (36.8 C) (07/02 0601) Temp Source: Axillary (07/02 0601) BP: 113/73 (07/02 0850) Pulse Rate: 123 (07/02 0850)  Labs:  Recent Labs  02/08/2017 1756 02/04/2017 2029 01/15/17 0028 01/15/17 0732  HGB 9.4*  --   --   --   HCT 27.4*  --   --   --   PLT 244  --   --   --   LABPROT 28.1*  --   --  27.3*  INR 2.57  --   --  2.48  CREATININE 2.90*  --  2.40*  --   TROPONINI 0.04* 0.05* 0.04*  --     Estimated Creatinine Clearance: 23.1 mL/min (A) (by C-G formula based on SCr of 2.4 mg/dL (H)).  Medications:  Warfarin PTA 2mg  daily  Assessment: 25 yom presented to the ED with lethargy and weakness, recent h/o n/v and decreased PO intake. On chronic coumadin for history of afib. INR was therapeutic upon admission 2.57. Pt failed swallow study 7/1 therefore did not get warfarin dose 7/1.   INR remains therapeutic at 2.48 after missing dose 7/1. Hgb low 9.4, platelets wnl. No bleeding noted. Dysphagia diet started 7/2 New DDI with zosyn noted, expected to potentiate effects of warfarin Elevated LFTs which may impact warfarin dosing. PLTC OK, albumin 2.4.    Goal of Therapy:  INR 2-3 Monitor platelets by anticoagulation protocol: Yes   Plan:  Warfarin 2mg  PO x 1 tonight Daily INR, CBC Monitor s/sx bleeding, new DDI, PO intake, clinical picture F/u ability to swallow meds  Carlean Jews 01/15/2017,9:37 AM

## 2017-01-15 NOTE — Progress Notes (Signed)
Patient refused CPAP tonight. Patient stated he did not think he would be able to tolerate it tonight. RT informed patient to have RT called if he changes his mind. RT will monitor as needed.

## 2017-01-15 NOTE — Progress Notes (Signed)
RT note-Called for moderate distress, sitting on the side of the bed, c/o urinary catheter discomfort. Patient does not like the NRB mask, placed on HFNC at 8L/min sp02 93%. Continue to monitor.

## 2017-01-15 NOTE — Progress Notes (Signed)
SLP Cancellation Note  Patient Details Name: JARET COPPEDGE MRN: [id number] DOB: 1926-11-27   Cancelled treatment:       Reason Eval/Treat Not Completed: Medical issues which prohibited therapy. Pt on non-rebreather, currently experiencing respiratory issues, RN in room with pt. If status improves today, please page and will reattempt swallow evaluation.   Kern Reap, [place], CCC-SLP 01/15/2017, 8:44 AM [phone]

## 2017-01-15 NOTE — Progress Notes (Addendum)
Modified Barium Swallow Progress Note  Patient Details  Name: [name] MRN: [id number] Date of Birth: 06/16/1927  Today's Date: 01/15/2017  Modified Barium Swallow completed.  Full report located under Chart Review in the Imaging Section.  Brief recommendations include the following:  Clinical Impression Pt with oropharyngeal swallow within functional limits; no penetration or aspiration observed, no delay in swallow initiation. Pt chewed up barium tablet; esophageal sweep revealed stasis of barium in the mid-lower esophagus. Pt has hx of Barrett's esophagus and GERD, along with decreased respiratory status, which increases risk of aspiration. Recommend advancing diet to regular/ thin liquids with reflux precautions- sit upright during and after meals, small bites/ sips, alternate food with liquid. SLP will f/u x1 for diet tolerance. Educated pt and son to findings/ recommendations for compensatory strategies; they are in agreement. Pt may benefit from further esophageal assessment.    Swallow Evaluation Recommendations   Recommended Consults: Consider esophageal assessment   SLP Diet Recommendations: Regular solids;Thin liquid   Liquid Administration via: Cup;Straw   Medication Administration: Whole meds with liquid   Supervision: Patient able to self feed;Intermittent supervision to cue for compensatory strategies   Compensations: Slow rate;Small sips/bites;Follow solids with liquid   Postural Changes: Remain semi-upright after after feeds/meals (Comment);Seated upright at 90 degrees   Oral Care Recommendations: Oral care BID        Kern Reap, MA, CCC-SLP 01/15/2017,1:42 PM  [phone]

## 2017-01-15 NOTE — Progress Notes (Signed)
Pharmacy Antibiotic Note  [name] is a 83 y.o. male admitted on 02/04/2017 with sepsis 2/2 HCAP vs UTI.  Pharmacy has been consulted for zosyn dosing.  Tmax 100.3, WBC 14.9, LA 1.6>>1.9.  SCr improved to 2.4 (baseline ~1), eCrCl ~23 ml/min.  Plan: Zosyn 3.375gm IV q8h F/u renal fxn, C&S, clinical status   Height: 5\' 11"  (180.3 cm) Weight: 198 lb (89.8 kg) IBW/kg (Calculated) : 75.3  Temp (24hrs), Avg:98.8 F (37.1 C), Min:98.2 F (36.8 C), Max:100.3 F (37.9 C)   Recent Labs Lab 02/12/2017 1756 02/13/2017 1804 01/19/2017 1957 01/29/2017 2354 01/15/17 0028  WBC 14.9*  --   --   --   --   CREATININE 2.90*  --   --   --  2.40*  LATICACIDVEN  --  1.69 1.6 1.9  --     Estimated Creatinine Clearance: 23.1 mL/min (A) (by C-G formula based on SCr of 2.4 mg/dL (H)).    Allergies  Allergen Reactions  . Metoprolol Tartrate Other (See Comments)    Anxiety, heart rate goes too low, feels like in a fog.    Antimicrobials this admission: Vanc 7/1>> 7/2 Zosyn 7/1>>   Dose adjustments this admission: N/A  Microbiology results: 7/2 Legionella UAg >> in process 7/2 Strep pneumo UAg >> negative 7/2 Respiratory panel >> sent 7/2 MRSA PCR >> negative 7/1 BCxr >> sent 7/1 UCxr >> sent  Thank you for allowing pharmacy to be a part of this patient's care.  Carlean Jews, PharmD 01/15/2017 2:44 PM

## 2017-01-15 NOTE — Progress Notes (Signed)
16 Fr. Coude cath inserted without difficulty.  No resistance met.  Patient tolerated procedure without complaint.  Immediate return of reddish/orange colored urine.    Fredna Dow M

## 2017-01-15 NOTE — Evaluation (Addendum)
Clinical/Bedside Swallow Evaluation Patient Details  Name: [name] MRN: [id number] Date of Birth: December 22, 1926  Today's Date: 01/15/2017 Time: SLP Start Time (ACUTE ONLY): 14 SLP Stop Time (ACUTE ONLY): 0930 SLP Time Calculation (min) (ACUTE ONLY): 15 min  Past Medical History:  Past Medical History:  Diagnosis Date  . Anemia   . Barrett's esophagus   . BENIGN PROSTATIC HYPERTROPHY   . Chronic anticoagulation    a. on Coumadin  . CORONARY ARTERY DISEASE    a. s/p stent OM 2002. b. s/p BMS 2006 RCA  c. s/p cath 2007.  [name] DEPRESSION   . DIVERTICULOSIS, COLON   . GERD   . GYNECOMASTIA, UNILATERAL   . Hematoma of abdominal wall   . History of echocardiogram    a. EF 55% (TEE 06/2014)  . HYPERLIPIDEMIA   . HYPERTENSION   . HYPOTHYROIDISM   . Mallory - Weiss tear    a. > 5 years ago  . Mitral regurgitation    a. Mild-mod by TEE 06/2014.  [name] Paroxysmal A-fib (HCC)    a. chronic amiodarone and coumadin;  b. 05/2013 s/p DCCV. c. 06/2014 s/p TEE/DCCV.  [name] Popliteal artery embolism, right ([place])   . SLEEP APNEA    CPAP   Past Surgical History:  Past Surgical History:  Procedure Laterality Date  . cardioversion  8/08  . CARDIOVERSION  09/14/2011   Procedure: CARDIOVERSION;  Surgeon: Josue Hector, MD;  Location: [facility];  Service: Cardiovascular;  Laterality: N/A;  . CARDIOVERSION N/A 03/23/2014   Procedure: CARDIOVERSION;  Surgeon: Thompson Grayer, MD;  Location: [place];  Service: Cardiovascular;  Laterality: N/A;  . CARDIOVERSION N/A 06/22/2014   Procedure: CARDIOVERSION;  Surgeon: [name], MD;  Location: [facility] ENDOSCOPY;  Service: Cardiovascular;  Laterality: N/A;  . CARDIOVERSION N/A 04/27/2015   Procedure: CARDIOVERSION;  Surgeon: Josue Hector, MD;  Location: [facility] ENDOSCOPY;  Service: Cardiovascular;  Laterality: N/A;  . CARDIOVERSION N/A 07/16/2015   Procedure: CARDIOVERSION;  Surgeon: [name], MD;  Location: [place];  Service: Cardiovascular;   Laterality: N/A;  . CARDIOVERSION N/A 11/26/2015   Procedure: CARDIOVERSION;  Surgeon: Sanda Klein, MD;  Location: [facility] ENDOSCOPY;  Service: Cardiovascular;  Laterality: N/A;  . CARDIOVERSION N/A 05/17/2016   Procedure: CARDIOVERSION;  Surgeon: Thayer Headings, MD;  Location: [place];  Service: Cardiovascular;  Laterality: N/A;  . CARDIOVERSION N/A 09/20/2016   Procedure: CARDIOVERSION;  Surgeon: [name], MD;  Location: [facility] ENDOSCOPY;  Service: Cardiovascular;  Laterality: N/A;  . CARDIOVERSION N/A 11/09/2016   Procedure: CARDIOVERSION;  Surgeon: [name], MD;  Location: [facility] ENDOSCOPY;  Service: Cardiovascular;  Laterality: N/A;  . CORONARY ANGIOPLASTY WITH STENT PLACEMENT    . ESOPHAGOGASTRODUODENOSCOPY    . ORIF ANKLE FRACTURE Right 01/06/2017   Procedure: OPEN REDUCTION INTERNAL FIXATION (ORIF) ANKLE FRACTURE, RIGHT;  Surgeon: Mcarthur Rossetti, MD;  Location: [place];  Service: Orthopedics;  Laterality: Right;  . TEE WITH CARDIOVERSION  7/08  . TEE WITHOUT CARDIOVERSION  09/14/2011   Procedure: TRANSESOPHAGEAL ECHOCARDIOGRAM (TEE);  Surgeon: Josue Hector, MD;  Location: [facility] ENDOSCOPY;  Service: Cardiovascular;  Laterality: N/A;  . TEE WITHOUT CARDIOVERSION N/A 06/22/2014   Procedure: TRANSESOPHAGEAL ECHOCARDIOGRAM (TEE);  Surgeon: [name], MD;  Location: [facility] ENDOSCOPY;  Service: Cardiovascular;  Laterality: N/A;  . TEE WITHOUT CARDIOVERSION N/A 05/17/2016   Procedure: TRANSESOPHAGEAL ECHOCARDIOGRAM (TEE);  Surgeon: Thayer Headings, MD;  Location: [place];  Service: Cardiovascular;  Laterality: N/A;  . TEE  WITHOUT CARDIOVERSION N/A 09/20/2016   Procedure: TRANSESOPHAGEAL ECHOCARDIOGRAM (TEE);  Surgeon: [name], MD;  Location: [facility] ENDOSCOPY;  Service: Cardiovascular;  Laterality: N/A;  . TRANSURETHRAL RESECTION OF PROSTATE     HPI:  Pt is an 83 y.o. male with PMH of Hypertension, hyperlipidemia, GERD, hypothyroidism, depression, anxiety, OSA on CPAP,  A. fib on Coumadin, popliteal artery embolism, MVvalve regurgitation, CAD, s/p of stent placement, BPH, iron deficiency anemia, CKD-3, who presented to ED 7/1 with confusion, cough, shortness breath, nausea, vomiting and diarrhea. Pt recently hospitalized for right tibia/fibular fracture from 6/22-6/25. Patient had surgery, and was discharged to rehabilitation facility. Per his son, patient become confused in the past 3 days. He is lethargic, and has poor appetite and decreased oral intake, generalized weakness, but no unilateral weakness, numbness or tingling to extremities. Patient has dry cough and shortness of breath, but no chest pain. Per his son. Pt has oxygen desaturation to 60-70%. He has fever and chills. Patient hasnausea and vomiting. CXR showed "chronic but increased pulmonary interstitial opacity, worse on the left. Top differential considerations include asymmetric pulmonary edema and viral/atypical respiratory infection." Abdominal CT showed R pleural effusion. Head CT negative for acute findings. A RN swallow screen was completed; pt had difficulty with water via straw along with fine crackles. Bedside swallow eval ordered.     Assessment / Plan / Recommendation Clinical Impression  Pt with overt s/s of aspiration (cough) with sips of thin liquid via straw consistent with RN swallow screen. No s/s of aspiration noted with small sips via cup. Pt is at an increased risk of aspiration given current respiratory status and increased RR to the high 20s with PO intake. Pt endorsed having difficulty swallowing liquids PTA; was unsure when difficulties began. Pt also with recurrent PNA. Recommend initiating dysphagia 3 diet/ thin liquids- no straws, meds whole in puree, intermittent supervision during meals to cue pt to take small bites/ sips. Also recommend proceeding with objective evaluation (MBS) to evaluate swallow function, particularly with liquids. Plan for MBS this afternoon. Pt in agreement  with plan. SLP Visit Diagnosis: Dysphagia, unspecified (R13.10)    Aspiration Risk  Moderate aspiration risk    Diet Recommendation Dysphagia 3 (Mech soft);Thin liquid   Liquid Administration via: Cup;No straw Medication Administration: Whole meds with puree Supervision: Patient able to self feed;Intermittent supervision to cue for compensatory strategies Compensations: Slow rate;Small sips/bites Postural Changes: Seated upright at 90 degrees;Remain upright for at least 30 minutes after po intake    Other  Recommendations Oral Care Recommendations: Oral care BID   Follow up Recommendations  (TBD)      Frequency and Duration            Prognosis        Swallow Study   General HPI: Pt is an 83 y.o. male with PMH of Hypertension, hyperlipidemia, GERD, hypothyroidism, depression, anxiety, OSA on CPAP, A. fib on Coumadin, popliteal artery embolism, MVvalve regurgitation, CAD, s/p of stent placement, BPH, iron deficiency anemia, CKD-3, who presented to ED 7/1 with confusion, cough, shortness breath, nausea, vomiting and diarrhea. Pt recently hospitalized for right tibia/fibular fracture from 6/22-6/25. Patient had surgery, and was discharged to rehabilitation facility. Per his son, patient become confused in the past 3 days. He is lethargic, and has poor appetite and decreased oral intake, generalized weakness, but no unilateral weakness, numbness or tingling to extremities. Patient has dry cough and shortness of breath, but no chest pain. Per his son. Pt has oxygen desaturation  to 60-70%. He has fever and chills. Patient hasnausea and vomiting. CXR showed "chronic but increased pulmonary interstitial opacity, worse on the left. Top differential considerations include asymmetric pulmonary edema and viral/atypical respiratory infection." Abdominal CT showed R pleural effusion. Head CT negative for acute findings. A RN swallow screen was completed; pt had difficulty with water via straw along  with fine crackles. Bedside swallow eval ordered.   Type of Study: Bedside Swallow Evaluation Previous Swallow Assessment: none in chart Diet Prior to this Study: NPO Temperature Spikes Noted: Yes Respiratory Status: Nasal cannula History of Recent Intubation: No Behavior/Cognition: Alert;Cooperative Oral Cavity Assessment: Within Functional Limits Oral Cavity - Dentition: Adequate natural dentition Vision: Functional for self-feeding Self-Feeding Abilities: Able to feed self Patient Positioning: Upright in bed Baseline Vocal Quality: Normal Volitional Cough: Strong Volitional Swallow: Able to elicit    Oral/Motor/Sensory Function Overall Oral Motor/Sensory Function: Within functional limits   Ice Chips Ice chips: Not tested   Thin Liquid Thin Liquid: Impaired Presentation: Cup;Straw Pharyngeal  Phase Impairments: Cough - Delayed    Nectar Thick Nectar Thick Liquid: Not tested   Honey Thick Honey Thick Liquid: Not tested   Puree Puree: Within functional limits   Solid   GO   Solid: Impaired Pharyngeal Phase Impairments: Cough - Delayed        Amy Dionicia Abler, [place], CCC-SLP 01/15/2017,9:36 AM [phone]

## 2017-01-15 NOTE — Progress Notes (Signed)
TEAM 1 - Stepdown/ICU TEAM  [name]  KPT:[id number] DOB: 10/30/1926 DOA: 01/19/2017 PCP: Marletta Lor, MD    Brief Narrative:  83 y.o. male with history of Hypertension, hyperlipidemia, GERD, hypothyroidism, depression, anxiety, OSA on CPAP, A. fib on Coumadin, popliteal artery embolism, MV regurg, CAD s/p stent placement, BPH, iron deficiency anemia, and CKD3, who presented with confusion, cough, shortness breath, and nausea w/ some limited vomiting.  Pt was hospitalized for a R tibia/fib fracture 6/22-6/25. Patient had surgery, and was discharged to a rehab facility. Per his son he began to be confused and lethargic, w/ poor appetite and decreased oral intake, as well as oxygen desaturations to 60-70%.   ED w/u revealed WBC 14.9, sodium 128, AST 594, ALT 338, total bilirubin 1.8, tachycardia, and tachypnea. CXR showed interstitial opacity, worse on the left. .  Subjective: I was called to the bedside by the patient's nurse due to respiratory distress.  Upon arrival the patient is somewhat tachypneic.  He is however alert and oriented.  His primary focus is on the fact that he "can't stand the catheter in his bladder."  His saturations are in the low 90s with oxygen support.  His heart rate is in the 120 range.  He denies chest pain or pressure.  He reports some generalized headache.  He denies abdominal pain nausea or vomiting at this time.  Assessment & Plan:  Sepsis v/s SIRS Unclear if this patient is actually suffering with an infectious disease versus SIRS physiology of noninfectious etiology - continue workup  - continue broad-spectrum antibiotic for now but will have a low threshold to discontinue  - stop vancomycin as MRSA screen negative   Acute hypoxic respiratory failure unclear etiology  - perhaps he does have a pneumonia but it is not yet visible because he is dehydrated - gently hydrate and follow  Transaminitis possible shock liver - an acute viral  hepatitis could also explain a number of his symptoms  - check viral hepatitis panel  - hydrate and follow - no evidence of acute gallbladder disease on ultrasound  Acute metabolic encephalopathy no acute findings on CT head  - suspect this is related to dehydration and hyponatremia  - check T-70 and folic acid levels  - hydrated and follow  Hyponatremia 186# appears to be his recent baseline wgt -  clinically he appears dehydrated at present despite the fact that his reported weight is significantly above his baseline  -I suspect his current weights are not accurate  - we will hydrate and follow sodium    Recent Labs Lab 01/18/2017 1756 01/15/17 0028  NA 128* 127*   Filed Weights   02/05/2017 1800 02/02/2017 2324 01/15/17 0612  Weight: 84.4 kg (186 lb 1.1 oz) 86.3 kg (190 lb 3.2 oz) 89.8 kg (198 lb)    Acute renal injury on CKD Stage 3 Baseline crt ~1.2 - suspect pre-renal azotemia - hydrate and follow   Recent Labs Lab 01/29/2017 1756 01/15/17 0028  CREATININE 2.90* 2.40*    Parox Afib on coumadin CHA2DS2 VASc score is 5 - s/p TEE DCCV March 2018 - is intolerant to BBs per Cardiology outpt notes - hydrate - follow rate   Hypothyroidism continue home Synthroid  R closed Tib/Fib fx no evidence of acute complications at this time - continue PT/OT  HLD  HTN Not an active problem at this time likely related to dehydration  - follow trend  CAD Asymptomatic presently - followed by Dr. Percival Spanish  in the outpatient setting  Mitral valve regurgitation  Mod via TEE March 2018  GERD  OSA Home CPAP   DVT prophylaxis: warfarin Code Status: DNR - NO CODE Family Communication: no family present at time of exam  Disposition Plan: SDU  Consultants:  none  Procedures: none  Antimicrobials:  Zosyn 7/1 > Vancomycin 7/1  Objective: Blood pressure 113/73, pulse (!) 123, temperature 98.3 F (36.8 C), temperature source Axillary, resp. rate (!) 27, height 5\' 11"  (1.803 m),  weight 89.8 kg (198 lb), SpO2 93 %.  Intake/Output Summary (Last 24 hours) at 01/15/17 0901 Last data filed at 01/15/17 1027  Gross per 24 hour  Intake             2630 ml  Output                0 ml  Net             2630 ml   Filed Weights   02/01/2017 1800 02/03/2017 2324 01/15/17 0612  Weight: 84.4 kg (186 lb 1.1 oz) 86.3 kg (190 lb 3.2 oz) 89.8 kg (198 lb)    Examination: General: Alert and conversant  - tachypneic  - mildly anxious  Lungs: good air movement throughout all fields with no focal crackles and no wheezing but some tachypnea  Cardiovascular: irregularly irregular and tachycardic at approximately 120 bpm  Abdomen: Nontender, nondistended, soft, bowel sounds positive, no rebound, no ascites, no appreciable mass Extremities: No significant cyanosis, clubbing, or edema bilateral lower extremities  CBC:  Recent Labs Lab 01/19/2017 1756  WBC 14.9*  NEUTROABS 12.3*  HGB 9.4*  HCT 27.4*  MCV 90.7  PLT 253   Basic Metabolic Panel:  Recent Labs Lab 01/24/2017 1756 01/15/17 0028  NA 128* 127*  K 4.0 3.8  CL 97* 101  CO2 22 17*  GLUCOSE 144* 126*  BUN 44* 43*  CREATININE 2.90* 2.40*  CALCIUM 7.8* 7.6*   GFR: Estimated Creatinine Clearance: 23.1 mL/min (A) (by C-G formula based on SCr of 2.4 mg/dL (H)).  Liver Function Tests:  Recent Labs Lab 01/26/2017 1756  AST 594*  ALT 338*  ALKPHOS 136*  BILITOT 1.8*  PROT 5.6*  ALBUMIN 2.4*    Recent Labs Lab 02/03/2017 2029  LIPASE 19    Recent Labs Lab 01/17/2017 1952  AMMONIA <9*    Coagulation Profile:  Recent Labs Lab 02/10/2017 1756  INR 2.57    Cardiac Enzymes:  Recent Labs Lab 01/15/2017 1756 02/05/2017 2029 01/15/17 0028  TROPONINI 0.04* 0.05* 0.04*    Recent Results (from the past 240 hour(s))  MRSA PCR Screening     Status: None   Collection Time: 01/05/17 11:51 PM  Result Value Ref Range Status   MRSA by PCR NEGATIVE NEGATIVE Final    Comment:        The GeneXpert MRSA Assay  (FDA approved for NASAL specimens only), is one component of a comprehensive MRSA colonization surveillance program. It is not intended to diagnose MRSA infection nor to guide or monitor treatment for MRSA infections.   Blood Culture (routine x 2)     Status: None (Preliminary result)   Collection Time: 01/23/2017  5:36 PM  Result Value Ref Range Status   Specimen Description BLOOD LEFT FOREARM  Final   Special Requests IN PEDIATRIC BOTTLE Blood Culture adequate volume  Final   Culture PENDING  Incomplete   Report Status PENDING  Incomplete  MRSA PCR Screening     Status: None  Collection Time: 01/15/17 12:44 AM  Result Value Ref Range Status   MRSA by PCR NEGATIVE NEGATIVE Final    Comment:        The GeneXpert MRSA Assay (FDA approved for NASAL specimens only), is one component of a comprehensive MRSA colonization surveillance program. It is not intended to diagnose MRSA infection nor to guide or monitor treatment for MRSA infections.      Scheduled Meds: . clonazePAM  0.5 mg Oral QHS  . dextromethorphan-guaiFENesin  1 tablet Oral BID  . diltiazem  180 mg Oral Daily  . finasteride  5 mg Oral Daily  . levalbuterol  1.25 mg Nebulization Q6H  . levothyroxine  50 mcg Oral Once per day on Sun Mon Wed Fri  . [START ON 01/16/2017] levothyroxine  75 mcg Oral Once per day on Tue Thu Sat  . multivitamin with minerals  1 tablet Oral Daily  . omega-3 acid ethyl esters  1 g Oral Daily  . pantoprazole  40 mg Oral BID  . pravastatin  40 mg Oral QPM  . saccharomyces boulardii  250 mg Oral BID  . warfarin  2 mg Oral Once  . Warfarin - Pharmacist Dosing Inpatient   Does not apply q1800     LOS: 1 day   Cherene Altes, MD [facility] Office  [phone] Pager - Text Page per Amion as per below:  On-Call/Text Page:      [email or web address]      password [email or web address]  If 7PM-7AM, please contact night-coverage [email or web address] Password [email or web address] 01/15/2017, 9:01 AM

## 2017-01-15 NOTE — Clinical Social Work Note (Signed)
Clinical Social Work Assessment  Patient Details  Name: [name] MRN: [id number] Date of Birth: 05/24/1927  Date of referral:  01/15/17               Reason for consult:  Discharge Planning, Facility Placement (from [place])                Permission sought to share information with:  Family Supports, Facility Contact Representative Permission granted to share information::  Yes, Verbal Permission Granted  Name::     Evelyn Sahli   Agency::  SNF  Relationship::  spouse  Contact Information:  [phone]   Housing/Transportation Living arrangements for the past 2 months:  Skilled Nursing Facility Source of Information:  Patient Patient Interpreter Needed:  None Criminal Activity/Legal Involvement Pertinent to Current Situation/Hospitalization:  No - Comment as needed Significant Relationships:  Adult Children, Spouse Lives with:  Spouse Do you feel safe going back to the place where you live?  Yes Need for family participation in patient care:  Yes (Comment)  Care giving concerns: Patient was living at home with wife before recent hospitalization for ankle fracture and subsequent SNF admission. Patient was in [facility] for one week before readmission. Son at bedside.   Social Worker assessment / plan: CSW met with patient and son at bedside. Son considering options for SNF vs. home health. Son indicated patient also has [facility] benefit and [facility] [place]. PT/OT evaluations pending. CSW will continue to follow and support with disposition planning.  Employment status:  Retired Insurance information:  Medicare PT Recommendations:  Not assessed at this time Information / Referral to community resources:  Skilled Nursing Facility  Patient/Family's Response to care: Patient with significant discomfort from urinary catheter and lethargic. Patient alert and oriented, but soft spoken. Patient and son appreciative of care.  Patient/Family's  Understanding of and Emotional Response to Diagnosis, Current Treatment, and Prognosis: Patient lethargic but indicated understanding of conditions. Son described rehab progress but displeased with care at SNF. Son hopeful for patient's mobility recovery at an alternate SNF or with home health PT.  Emotional Assessment Appearance:  Appears stated age Attitude/Demeanor/Rapport:  Other (appropriate; in pain) Affect (typically observed):  Appropriate Orientation:  Oriented to Self, Oriented to Place, Oriented to  Time, Oriented to Situation Alcohol / Substance use:  Not Applicable Psych involvement (Current and /or in the community):  No (Comment)  Discharge Needs  Concerns to be addressed:  Discharge Planning Concerns Readmission within the last 30 days:  Yes Current discharge risk:  Dependent with Mobility, Physical Impairment Barriers to Discharge:  Continued Medical Work up    , LCSW 01/15/2017, 2:03 PM  

## 2017-01-16 ENCOUNTER — Inpatient Hospital Stay (HOSPITAL_COMMUNITY): Payer: Medicare Other

## 2017-01-16 DIAGNOSIS — R1310 Dysphagia, unspecified: Secondary | ICD-10-CM

## 2017-01-16 DIAGNOSIS — I340 Nonrheumatic mitral (valve) insufficiency: Secondary | ICD-10-CM

## 2017-01-16 DIAGNOSIS — I360 Nonrheumatic tricuspid (valve) stenosis: Secondary | ICD-10-CM

## 2017-01-16 DIAGNOSIS — N171 Acute kidney failure with acute cortical necrosis: Secondary | ICD-10-CM

## 2017-01-16 DIAGNOSIS — I272 Pulmonary hypertension, unspecified: Secondary | ICD-10-CM

## 2017-01-16 DIAGNOSIS — R630 Anorexia: Secondary | ICD-10-CM

## 2017-01-16 DIAGNOSIS — E038 Other specified hypothyroidism: Secondary | ICD-10-CM

## 2017-01-16 DIAGNOSIS — K7200 Acute and subacute hepatic failure without coma: Secondary | ICD-10-CM

## 2017-01-16 DIAGNOSIS — I480 Paroxysmal atrial fibrillation: Secondary | ICD-10-CM

## 2017-01-16 DIAGNOSIS — N400 Enlarged prostate without lower urinary tract symptoms: Secondary | ICD-10-CM

## 2017-01-16 DIAGNOSIS — I5042 Chronic combined systolic (congestive) and diastolic (congestive) heart failure: Secondary | ICD-10-CM

## 2017-01-16 DIAGNOSIS — G4733 Obstructive sleep apnea (adult) (pediatric): Secondary | ICD-10-CM

## 2017-01-16 LAB — ECHOCARDIOGRAM COMPLETE
FS: 26 % — AB (ref 28–44)
Height: 71 in
IV/PV OW: 0.8
LA ID, A-P, ES: 50 mm
LA vol: 105 mL
LADIAMINDEX: 2.39 cm/m2
LAVOLA4C: 110 mL
LAVOLIN: 50.2 mL/m2
LDCA: 3.8 cm2
LEFT ATRIUM END SYS DIAM: 50 mm
LVOTD: 22 mm
PW: 12.4 mm — AB (ref 0.6–1.1)
WEIGHTICAEL: 3148.8 [oz_av]

## 2017-01-16 LAB — CBC
HCT: 28.2 % — ABNORMAL LOW (ref 39.0–52.0)
Hemoglobin: 9.3 g/dL — ABNORMAL LOW (ref 13.0–17.0)
MCH: 30.3 pg (ref 26.0–34.0)
MCHC: 33 g/dL (ref 30.0–36.0)
MCV: 91.9 fL (ref 78.0–100.0)
PLATELETS: 225 10*3/uL (ref 150–400)
RBC: 3.07 MIL/uL — ABNORMAL LOW (ref 4.22–5.81)
RDW: 14.3 % (ref 11.5–15.5)
WBC: 13.1 10*3/uL — ABNORMAL HIGH (ref 4.0–10.5)

## 2017-01-16 LAB — COMPREHENSIVE METABOLIC PANEL
ALK PHOS: 148 U/L — AB (ref 38–126)
ALT: 230 U/L — ABNORMAL HIGH (ref 17–63)
ANION GAP: 8 (ref 5–15)
AST: 147 U/L — ABNORMAL HIGH (ref 15–41)
Albumin: 2.1 g/dL — ABNORMAL LOW (ref 3.5–5.0)
BUN: 38 mg/dL — ABNORMAL HIGH (ref 6–20)
CALCIUM: 8 mg/dL — AB (ref 8.9–10.3)
CHLORIDE: 103 mmol/L (ref 101–111)
CO2: 22 mmol/L (ref 22–32)
Creatinine, Ser: 1.59 mg/dL — ABNORMAL HIGH (ref 0.61–1.24)
GFR calc non Af Amer: 37 mL/min — ABNORMAL LOW (ref >60)
GFR, EST AFRICAN AMERICAN: 43 mL/min — AB (ref >60)
Glucose, Bld: 139 mg/dL — ABNORMAL HIGH (ref 65–99)
Potassium: 3.6 mmol/L (ref 3.5–5.1)
SODIUM: 133 mmol/L — AB (ref 135–145)
Total Bilirubin: 1.6 mg/dL — ABNORMAL HIGH (ref 0.3–1.2)
Total Protein: 5.4 g/dL — ABNORMAL LOW (ref 6.5–8.1)

## 2017-01-16 LAB — PROTIME-INR
INR: 2.76
PROTHROMBIN TIME: 29.7 s — AB (ref 11.4–15.2)

## 2017-01-16 LAB — IRON AND TIBC
IRON: 11 ug/dL — AB (ref 45–182)
Saturation Ratios: 5 % — ABNORMAL LOW (ref 17.9–39.5)
TIBC: 223 ug/dL — ABNORMAL LOW (ref 250–450)
UIBC: 212 ug/dL

## 2017-01-16 LAB — LEGIONELLA PNEUMOPHILA SEROGP 1 UR AG: L. pneumophila Serogp 1 Ur Ag: NEGATIVE

## 2017-01-16 LAB — FERRITIN: Ferritin: 182 ng/mL (ref 24–336)

## 2017-01-16 LAB — HEPATITIS PANEL, ACUTE
HCV AB: 0.5 {s_co_ratio} (ref 0.0–0.9)
HCV Ab: 0.4 s/co ratio (ref 0.0–0.9)
HEP B C IGM: NEGATIVE
HEP B S AG: NEGATIVE
Hep A IgM: NEGATIVE
Hep A IgM: NEGATIVE
Hep B C IgM: NEGATIVE
Hepatitis B Surface Ag: NEGATIVE

## 2017-01-16 LAB — URINE CULTURE: Culture: NO GROWTH

## 2017-01-16 LAB — HEMOGLOBIN A1C
HEMOGLOBIN A1C: 5.5 % (ref 4.8–5.6)
MEAN PLASMA GLUCOSE: 111 mg/dL

## 2017-01-16 LAB — FOLATE: FOLATE: 17.8 ng/mL (ref >5.9)

## 2017-01-16 LAB — RETICULOCYTES
RBC.: 3.07 MIL/uL — ABNORMAL LOW (ref 4.22–5.81)
RETIC COUNT ABSOLUTE: 64.5 10*3/uL (ref 19.0–186.0)
Retic Ct Pct: 2.1 % (ref 0.4–3.1)

## 2017-01-16 LAB — TROPONIN I: Troponin I: 0.08 ng/mL (ref <0.03)

## 2017-01-16 LAB — VITAMIN B12: VITAMIN B 12: 2711 pg/mL — AB (ref 180–914)

## 2017-01-16 MED ORDER — FUROSEMIDE 10 MG/ML IJ SOLN
60.0000 mg | Freq: Once | INTRAMUSCULAR | Status: AC
  Administered 2017-01-16: 60 mg via INTRAVENOUS
  Filled 2017-01-16: qty 6

## 2017-01-16 MED ORDER — MEGESTROL ACETATE 20 MG PO TABS
800.0000 mg | ORAL_TABLET | Freq: Every day | ORAL | Status: DC
  Filled 2017-01-16: qty 40

## 2017-01-16 MED ORDER — WARFARIN SODIUM 2 MG PO TABS
2.0000 mg | ORAL_TABLET | Freq: Once | ORAL | Status: AC
  Administered 2017-01-16: 2 mg via ORAL
  Filled 2017-01-16: qty 1

## 2017-01-16 MED ORDER — SODIUM CHLORIDE 0.9 % IV SOLN
1250.0000 mg | INTRAVENOUS | Status: DC
  Administered 2017-01-16 – 2017-01-18 (×3): 1250 mg via INTRAVENOUS
  Filled 2017-01-16 (×3): qty 1250

## 2017-01-16 MED ORDER — MEGESTROL ACETATE 400 MG/10ML PO SUSP
800.0000 mg | Freq: Every day | ORAL | Status: DC
  Administered 2017-01-16 – 2017-01-24 (×9): 800 mg via ORAL
  Filled 2017-01-16 (×10): qty 20

## 2017-01-16 MED ORDER — DOXAZOSIN MESYLATE 1 MG PO TABS
1.0000 mg | ORAL_TABLET | Freq: Every day | ORAL | Status: DC
  Administered 2017-01-16 – 2017-01-22 (×7): 1 mg via ORAL
  Filled 2017-01-16 (×7): qty 1

## 2017-01-16 MED ORDER — DILTIAZEM HCL 100 MG IV SOLR
5.0000 mg/h | INTRAVENOUS | Status: AC
  Administered 2017-01-16 – 2017-01-17 (×2): 5 mg/h via INTRAVENOUS
  Filled 2017-01-16 (×3): qty 100

## 2017-01-16 NOTE — Progress Notes (Signed)
ANTICOAGULATION CONSULT NOTE - Follow up [facility] for warfarin Indication: atrial fibrillation  Allergies  Allergen Reactions  . Metoprolol Tartrate Other (See Comments)    Anxiety, heart rate goes too low, feels like in a fog.    Patient Measurements: Height: 5\' 11"  (180.3 cm) Weight: 196 lb 12.8 oz (89.3 kg) IBW/kg (Calculated) : 75.3  Vital Signs: Temp: 97.9 F (36.6 C) (07/03 0753) Temp Source: Oral (07/03 0753) BP: 143/85 (07/03 0753) Pulse Rate: 99 (07/03 0753)  Labs:  Recent Labs  02/11/2017 1756  01/15/17 0028 01/15/17 0732 01/15/17 1449 01/16/17 0253  HGB 9.4*  --   --   --   --  9.3*  HCT 27.4*  --   --   --   --  28.2*  PLT 244  --   --   --   --  225  LABPROT 28.1*  --   --  27.3*  --  29.7*  INR 2.57  --   --  2.48  --  2.76  CREATININE 2.90*  --  2.40*  --  1.81* 1.59*  TROPONINI 0.04*  < > 0.04* 0.10*  --  0.08*  < > = values in this interval not displayed.  Estimated Creatinine Clearance: 34.9 mL/min (A) (by C-G formula based on SCr of 1.59 mg/dL (H)).  Medications:  Warfarin PTA 2mg  daily  Assessment: 34 yom presented to the ED with lethargy and weakness, recent h/o n/v and decreased PO intake. On chronic coumadin for history of afib. INR was therapeutic upon admission 2.57. Pt failed swallow study 7/1 therefore did not get warfarin dose 7/1.   INR remains therapeutic at 2.76 after missing one dose 7/1.  Hgb low stable, platelets wnl stable. No bleeding noted. Dysphagia diet started 7/2 - moderate PO intake. Patient successfully taking meds now. New DDI with zosyn noted, expected to potentiate effects of warfarin Elevated LFTs down trending, may impact warfarin dosing. PLTC OK, albumin 2.1.    Goal of Therapy:  INR 2-3 Monitor platelets by anticoagulation protocol: Yes   Plan:  Warfarin 2mg  PO x 1 tonight Daily INR, CBC Monitor s/sx bleeding, new DDI, PO intake, clinical picture   Carlean Jews 01/16/2017,8:11 AM

## 2017-01-16 NOTE — Care Management Note (Addendum)
Case Management Note  Patient Details  Name: [name] MRN: [id number] Date of Birth: [birth year]  Subjective/Objective: Pt presented for Sepsis- confusion, cough and SOB. Pt continues on IV Zosyn and IV Cardizem for A fib. Pt is from [facility] and family does not want pt to return. CSW is following in regards to SNF at [facility]. IF no beds available- pt may return home.                   Action/Plan: CM did offer agency list for home. CM will continue to monitor.  Expected Discharge Date:                  Expected Discharge Plan:  [place]  In-House Referral:  Clinical Social Work  Discharge planning Services  CM Consult  Post Acute Care Choice:  Home Health Choice offered to:     DME Arranged:    DME Agency:     HH Arranged:    [place] Agency:     Status of Service:  In process, will continue to follow  If discussed at Long Length of Stay Meetings, dates discussed:  01-23-17  Additional Comments: 1126 01-23-17 Jacqlyn Krauss, RN,BSN [phone] Pt has support at home from wife [name] and son [name] at the bedside. CM previously provided family with [facility] Agency List and [place] List. CM did speak with son in regards to [place]. Palliative Consult will be beneficial if the family is open to it. Pt was discussed in the [place] on Yesterday. If the plan is for home pt will need to be [place] and it is [facility]. This has not been discussed with the family due to status of patient. Pt with persistent Atrial Fib- Question AV nodal ablation with pacemaker implantation. Plan for IV Lasix today. CSW still looking into [place] for Rehab and Long term bed. CM will continue to monitor for disposition needs.     1542 01-19-17 Jacqlyn Krauss, RN,BSN [phone] Per CSW still no availability for [facility] SNF bed. CM will continue to monitor for home needs.  Bethena Roys, RN 01/16/2017, 4:42 PM

## 2017-01-16 NOTE — Progress Notes (Signed)
  Speech Language Pathology Treatment: Dysphagia  Patient Details Name: [name] MRN: [id number] DOB: 21-Apr-1927 Today's Date: 01/16/2017 Time: 7939-0300 SLP Time Calculation (min) (ACUTE ONLY): 20 min  Assessment / Plan / Recommendation Clinical Impression  Pt tolerating lunch meal well.  He does have a cough unrelated to dysphagia, which was ruled out yesterday per MBS. Oropharyngeal function was WNL.  Video reviewed with son.  Pt with longstanding GERD.  Reviewed basic reflux precautions with pt and son, who verbalize understanding.  No SLP f/u is warranted.  Our services will sign off.   HPI HPI: Pt is an 83 y.o. male with PMH of Hypertension, hyperlipidemia, GERD, hypothyroidism, depression, anxiety, OSA on CPAP, A. fib on Coumadin, popliteal artery embolism, MVvalve regurgitation, CAD, s/p of stent placement, BPH, iron deficiency anemia, CKD-3, who presented to ED 7/1 with confusion, cough, shortness breath, nausea, vomiting and diarrhea. Pt recently hospitalized for right tibia/fibular fracture from 6/22-6/25. Patient had surgery, and was discharged to rehabilitation facility. Per his son, patient become confused in the past 3 days. He is lethargic, and has poor appetite and decreased oral intake, generalized weakness, but no unilateral weakness, numbness or tingling to extremities. Patient has dry cough and shortness of breath, but no chest pain. Per his son. Pt has oxygen desaturation to 60-70%. He has fever and chills. Patient hasnausea and vomiting. CXR showed "chronic but increased pulmonary interstitial opacity, worse on the left. Top differential considerations include asymmetric pulmonary edema and viral/atypical respiratory infection." Abdominal CT showed R pleural effusion. Head CT negative for acute findings. A RN swallow screen was completed; pt had difficulty with water via straw along with fine crackles. Bedside swallow eval ordered.        SLP Plan  All goals met        Recommendations  Diet recommendations: Regular;Thin liquid Liquids provided via: Cup;Straw Medication Administration: Whole meds with puree Supervision: Patient able to self feed Compensations: Slow rate;Small sips/bites;Follow solids with liquid Postural Changes and/or Swallow Maneuvers: Seated upright 90 degrees                Oral Care Recommendations: Oral care BID Follow up Recommendations: None SLP Visit Diagnosis: Dysphagia, unspecified (R13.10) Plan: All goals met       GO                [name] 01/16/2017, 12:39 PM  [name] L. [name], [place] CCC/SLP Pager [phone]

## 2017-01-16 NOTE — Progress Notes (Signed)
PROGRESS NOTE    [name]  IFO:[id number] DOB: March 02, 1927 DOA: 02/12/2017 PCP: Marletta Lor, MD   Brief Narrative:  83 y.o.WM PMHx Depression, Anxiety,HTN, A-Fib on Coumadin, popliteal artery embolism, MVregurg, CAD s/p stent placement,  HLD, GERD, hypothyroidism, OSA on CPAP, BPH, iron deficiency anemia, and CKD stage 3,   Who presented with confusion, cough, shortness breath, and nausea w/ some limited vomiting.  Pt was hospitalized for a Rt tibia/fib fracture 6/22-6/25. Patient had surgery, and was discharged to a rehab facility. Per his son he began to be confused and lethargic, w/ poor appetite and decreased oral intake, as well as oxygen desaturations to 60-70%.   ED w/u revealed WBC 14.9, sodium 128, AST 594, ALT 338, total bilirubin 1.8, tachycardia, and tachypnea. CXR showed interstitial opacity, worse on the left. .   Subjective: 7/3  A/O 2 (does not know when, why), negative CP, positive SOB, positive fatigue, negative N/V. Per son significant decline in mental status and physical ability from last week.     Assessment & Plan:   Principal Problem:   Sepsis ([place]) Active Problems:   Hypothyroidism   HLD (hyperlipidemia)   Essential hypertension   CAD (coronary artery disease)   GERD   Iron deficiency anemia   Chronic anticoagulation   Anemia   Atrial fibrillation with RVR (HCC)   Elevated troponin   Abnormal LFTs   Acute renal failure superimposed on stage 3 chronic kidney disease (HCC)   Anxiety   Acute metabolic encephalopathy   Nausea, vomiting, and diarrhea   Acute respiratory failure with hypoxia (HCC)   Hyponatremia   Sepsis unspecified organism -Afebrile overnight however positive leukocytosis -Positive GPC 1/2 blood culture. Awaiting speciation will continue aggressive antibiotics  Acute respiratory failure with hypoxia  -unclear etiology,perhaps he does have a pneumonia but it is not yet visible because he is dehydrated  Pulmonary  edema -KVO normal saline -Albumin 50 g 1 -Lasix IV 60 mg 1, reevaluate in A.m. and redosed  Transaminitis/Shock Liver -Acute hepatitis panel negative -LE improving but still elevated possible shock liver - an acute viral hepatitis could also explain a number of his symptoms  - check viral hepatitis panel  - hydrate and follow - no evidence of acute gallbladder disease on ultrasound  Acute metabolic encephalopathy -no acute findings on CT head   - suspect this is related to dehydration, sepsis? - check M-76 and folic acid levels   Acute renal failure on CKD Stage 3(Baseline crt ~1.2)  - suspect pre-renal azotemia - hydrate and follow  Lab Results  Component Value Date   CREATININE 1.59 (H) 01/16/2017   CREATININE 1.81 (H) 01/15/2017   CREATININE 2.40 (H) 01/15/2017  -Improving but not at baseline.  Parox A-fib(CHA2DS2 VASc score is 5  ) -on coumadin: Coumadin per pharmacy Recent Labs Lab 02/09/2017 1756 01/15/17 0732 01/16/17 0253  INR 2.57 2.48 2.76  -Rate not well controlled -Will DC long-acting Cardizem and start Cardizem drip until better controlled -Per son has had multiple DCCV no longer a candidate for further conversion - s/p TEE DCCV March 2018  - is intolerant to BBs per Cardiology outpt notes  -Add Amiodarone on 7/4 if rate still not controlled. Could give one or two doses of digoxin but would avoid long-term use secondary to renal function  Chronic systolic and diastolic CHF -Baseline weight 186 pounds -See A. Fib -Strict I&O since admission+ 4.5 L -Daily weight Filed Weights   02/08/2017 2324 01/15/17 0612 01/16/17 0401  Weight: 190 lb 3.2 oz (86.3 kg) 198 lb (89.8 kg) 196 lb 12.8 oz (89.3 kg)    Pulmonary Hypertension -Echocardiogram from this admission when compared to TEE 09/20/16 pulmonary hypertension has significantly worsened.  HTN -Not an active problem at this time likely related to dehydration  - follow trend  CAD -Asymptomatic  presently - followed by Dr. Percival Spanish in the outpatient setting  Mitral valve regurgitation  Mod via TEE March 2018  Hypothyroidism continue home Synthroid  Rt closed Tib/Fib fx -no evidence of acute complications at this time  - continue PT/OT when appropriate  HLD   Dysphagia/Anorexia -7/2 MBS passed: Regular solids thin liquid -Megace 800 mg daily  OSA -CPAP per respiratory  BPH/Urinary retention -Finasteride 5 mg daily -Doxazosin 1 mg daily       DVT prophylaxis: Warfarin Code Status: DO NOT RESUSCITATE Family Communication: Son at bedside Disposition Plan: TBD   Consultants:  None    Procedures/Significant Events:  7/3 Echocardiogram:Left ventricle:mild LVH. -LVEF =55% to 60%. - Mitral valve: moderate regurgitation.- Left atrium: Severely dilated.- Right ventricle: moderately dilated.  - Tricuspid valve: moderate regurgitation. - Pulmonary arteries: PA peak pressure: 52 mm Hg (S).- Inferior vena cava: The vessel was dilated. consistent with elevated   central venous pressure. 7/2 CT head Wo contrast:Brain: No evidence of acute infarction, hemorrhage, hydrocephalus, 7/2 ultrasound abdomen:No gallstones or wall thickening visualized-Small RT pleural effusion -Large simple renal cysts  7/2 MBS passed: Regular solids thin liquid    VENTILATOR SETTINGS:    Cultures 7/1 urine negative final 7/2 blood positive for GPC in 1/2 bottles 7/2 MRSA by PCR negative    Antimicrobials: Anti-infectives    Start     Stop   01/16/17 2000  vancomycin (VANCOCIN) IVPB 1000 mg/200 mL premix  Status:  Discontinued     01/15/17 0921   01/16/17 1000  vancomycin (VANCOCIN) 1,250 mg in sodium chloride 0.9 % 250 mL IVPB         01/15/17 1900  piperacillin-tazobactam (ZOSYN) IVPB 3.375 g  Status:  Discontinued     01/15/17 1443   01/15/17 1500  piperacillin-tazobactam (ZOSYN) IVPB 3.375 g         01/15/17 0100  piperacillin-tazobactam (ZOSYN) IVPB 2.25 g   Status:  Discontinued     01/15/17 1441   01/21/2017 1830  piperacillin-tazobactam (ZOSYN) IVPB 3.375 g     02/03/2017 2317   02/09/2017 1830  vancomycin (VANCOCIN) IVPB 1000 mg/200 mL premix  Status:  Discontinued     01/18/2017 1827   01/31/2017 1830  vancomycin (VANCOCIN) 1,500 mg in sodium chloride 0.9 % 500 mL IVPB     01/29/2017 2359       Devices    LINES / TUBES:  None    Continuous Infusions: . sodium chloride 100 mL/hr at 01/16/17 0656  . piperacillin-tazobactam (ZOSYN)  IV 3.375 g (01/16/17 0656)  . vancomycin       Objective: Vitals:   01/16/17 0030 01/16/17 0340 01/16/17 0401 01/16/17 0753  BP: 103/61 106/71 96/67 (!) 143/85  Pulse: (!) 112  (!) 101 99  Resp: 19  (!) 26 14  Temp: 97.8 F (36.6 C)  97.8 F (36.6 C) 97.9 F (36.6 C)  TempSrc: Oral  Oral Oral  SpO2: 98% 100% 100% 98%  Weight:   196 lb 12.8 oz (89.3 kg)   Height:        Intake/Output Summary (Last 24 hours) at 01/16/17 8250 Last data filed at 01/16/17 0500  Gross  per 24 hour  Intake             1565 ml  Output              150 ml  Net             1415 ml   Filed Weights   01/19/2017 2324 01/15/17 0612 01/16/17 0401  Weight: 190 lb 3.2 oz (86.3 kg) 198 lb (89.8 kg) 196 lb 12.8 oz (89.3 kg)    Examination:  General: A/O 2 (does not know when, why), positive acute respiratory distress Eyes: negative scleral hemorrhage, negative anisocoria, negative icterus ENT: Negative Runny nose, negative gingival bleeding, Neck:  Negative scars, masses, torticollis, lymphadenopathy, JVD Lungs: positive diffuse rhonchi and crackles, negative wheezes  Cardiovascular: Irregular irregular rhythm and rate, without murmur gallop or rub normal S1 and S2 Abdomen: negative abdominal pain, nondistended, positive soft, bowel sounds, no rebound, no ascites, no appreciable mass Extremities: positive bilateral lower extremity edema 2+  Skin: Negative rashes, lesions, ulcers Psychiatric:  Negative depression, negative  anxiety, positive fatigue, negative mania  Central nervous system:  Cranial nerves II through XII intact, tongue/uvula midline, all extremities muscle strength 5/5, sensation intact throughout,negative dysarthria, negative expressive aphasia, negative receptive aphasia.  .     Data Reviewed: Care during the described time interval was provided by me .  I have reviewed this patient's available data, including medical history, events of note, physical examination, and all test results as part of my evaluation. I have personally reviewed and interpreted all radiology studies.  CBC:  Recent Labs Lab 02/12/2017 1756 01/16/17 0253  WBC 14.9* 13.1*  NEUTROABS 12.3*  --   HGB 9.4* 9.3*  HCT 27.4* 28.2*  MCV 90.7 91.9  PLT 244 893   Basic Metabolic Panel:  Recent Labs Lab 02/05/2017 1756 01/15/17 0028 01/15/17 1449 01/16/17 0253  NA 128* 127* 132* 133*  K 4.0 3.8 3.8 3.6  CL 97* 101 101 103  CO2 22 17* 18* 22  GLUCOSE 144* 126* 147* 139*  BUN 44* 43* 37* 38*  CREATININE 2.90* 2.40* 1.81* 1.59*  CALCIUM 7.8* 7.6* 7.9* 8.0*   GFR: Estimated Creatinine Clearance: 34.9 mL/min (A) (by C-G formula based on SCr of 1.59 mg/dL (H)). Liver Function Tests:  Recent Labs Lab 01/19/2017 1756 01/15/17 1449 01/16/17 0253  AST 594* 215* 147*  ALT 338* 271* 230*  ALKPHOS 136* 151* 148*  BILITOT 1.8* 1.8* 1.6*  PROT 5.6* 6.6 5.4*  ALBUMIN 2.4* 2.3* 2.1*    Recent Labs Lab 01/22/2017 2029  LIPASE 19    Recent Labs Lab 02/07/2017 1952  AMMONIA <9*   Coagulation Profile:  Recent Labs Lab 02/01/2017 1756 01/15/17 0732 01/16/17 0253  INR 2.57 2.48 2.76   Cardiac Enzymes:  Recent Labs Lab 01/29/2017 1756 02/10/2017 2029 01/15/17 0028 01/15/17 0732 01/16/17 0253  TROPONINI 0.04* 0.05* 0.04* 0.10* 0.08*   BNP (last 3 results) No results for input(s): PROBNP in the last 8760 hours. HbA1C:  Recent Labs  01/15/17 0028  HGBA1C 5.5   CBG: No results for input(s): GLUCAP in the  last 168 hours. Lipid Profile:  Recent Labs  01/15/17 0028  CHOL 85  HDL 29*  LDLCALC 47  TRIG 43  CHOLHDL 2.9   Thyroid Function Tests:  Recent Labs  01/15/17 0028  TSH 0.789   Anemia Panel:  Recent Labs  01/16/17 0253  VITAMINB12 2,711*  FOLATE 17.8  FERRITIN 182  TIBC 223*  IRON 11*  RETICCTPCT  2.1   Urine analysis:    Component Value Date/Time   COLORURINE AMBER (A) 02/07/2017 0120   APPEARANCEUR CLOUDY (A) 01/28/2017 0120   LABSPEC 1.024 02/09/2017 0120   PHURINE 5.0 01/28/2017 0120   GLUCOSEU NEGATIVE 02/13/2017 0120   HGBUR NEGATIVE 02/12/2017 0120   BILIRUBINUR SMALL (A) 02/04/2017 0120   KETONESUR NEGATIVE 02/09/2017 0120   PROTEINUR 30 (A) 02/02/2017 0120   UROBILINOGEN 2.0 (H) 11/10/2009 0122   NITRITE NEGATIVE 02/09/2017 0120   LEUKOCYTESUR NEGATIVE 01/17/2017 0120   Sepsis Labs: @LABRCNTIP (procalcitonin:4,lacticidven:4)  ) Recent Results (from the past 240 hour(s))  Blood Culture (routine x 2)     Status: None (Preliminary result)   Collection Time: 02/12/2017  5:36 PM  Result Value Ref Range Status   Specimen Description BLOOD LEFT FOREARM  Final   Special Requests IN PEDIATRIC BOTTLE Blood Culture adequate volume  Final   Culture  Setup Time   Final    GRAM POSITIVE COCCI IN CLUSTERS IN PEDIATRIC BOTTLE Organism ID to follow CRITICAL RESULT CALLED TO, READ BACK BY AND VERIFIED WITHOphelia Shoulder, AT [id number] 01/16/17 BY D. VANHOOK    Culture GRAM POSITIVE COCCI  Final   Report Status PENDING  Incomplete  Blood Culture (routine x 2)     Status: None (Preliminary result)   Collection Time: 02/12/2017  5:56 PM  Result Value Ref Range Status   Specimen Description BLOOD LEFT WRIST  Final   Special Requests   Final    BOTTLES DRAWN AEROBIC AND ANAEROBIC Blood Culture adequate volume   Culture NO GROWTH < 24 HOURS  Final   Report Status PENDING  Incomplete  Respiratory Panel by PCR     Status: None   Collection Time: 01/15/17 12:42 AM   Result Value Ref Range Status   Adenovirus NOT DETECTED NOT DETECTED Final   Coronavirus 229E NOT DETECTED NOT DETECTED Final   Coronavirus HKU1 NOT DETECTED NOT DETECTED Final   Coronavirus NL63 NOT DETECTED NOT DETECTED Final   Coronavirus OC43 NOT DETECTED NOT DETECTED Final   Metapneumovirus NOT DETECTED NOT DETECTED Final   Rhinovirus / Enterovirus NOT DETECTED NOT DETECTED Final   Influenza A NOT DETECTED NOT DETECTED Final   Influenza B NOT DETECTED NOT DETECTED Final   Parainfluenza Virus 1 NOT DETECTED NOT DETECTED Final   Parainfluenza Virus 2 NOT DETECTED NOT DETECTED Final   Parainfluenza Virus 3 NOT DETECTED NOT DETECTED Final   Parainfluenza Virus 4 NOT DETECTED NOT DETECTED Final   Respiratory Syncytial Virus NOT DETECTED NOT DETECTED Final   Bordetella pertussis NOT DETECTED NOT DETECTED Final   Chlamydophila pneumoniae NOT DETECTED NOT DETECTED Final   Mycoplasma pneumoniae NOT DETECTED NOT DETECTED Final  MRSA PCR Screening     Status: None   Collection Time: 01/15/17 12:44 AM  Result Value Ref Range Status   MRSA by PCR NEGATIVE NEGATIVE Final    Comment:        The GeneXpert MRSA Assay (FDA approved for NASAL specimens only), is one component of a comprehensive MRSA colonization surveillance program. It is not intended to diagnose MRSA infection nor to guide or monitor treatment for MRSA infections.          Radiology Studies: Ct Head Wo Contrast  Result Date: 01/15/2017 CLINICAL DATA:  Acute onset of encephalopathy. Altered mental status. Patient on Coumadin. Initial encounter. EXAM: CT HEAD WITHOUT CONTRAST TECHNIQUE: Contiguous axial images were obtained from the base of the skull through the vertex without  intravenous contrast. COMPARISON:  CT of the head performed 03/22/2007 FINDINGS: Brain: No evidence of acute infarction, hemorrhage, hydrocephalus, extra-axial collection or mass lesion/mass effect. Prominence of the ventricles and sulci reflects  moderate cortical volume loss. Cerebellar atrophy is noted. Scattered periventricular white matter change likely reflects small vessel ischemic microangiopathy. The brainstem and fourth ventricle are within normal limits. The basal ganglia are unremarkable in appearance. The cerebral hemispheres demonstrate grossly normal gray-white differentiation. No mass effect or midline shift is seen. Vascular: No hyperdense vessel or unexpected calcification. Skull: There is no evidence of fracture; visualized osseous structures are unremarkable in appearance. Sinuses/Orbits: The orbits are within normal limits. The paranasal sinuses and mastoid air cells are well-aerated. Other: No significant soft tissue abnormalities are seen. IMPRESSION: 1. No acute intracranial pathology seen on CT. 2. Moderate cortical volume loss and scattered small vessel ischemic microangiopathy. Electronically Signed   By: Garald Balding M.D.   On: 01/15/2017 04:08   US Abdomen Complete  Result Date: 01/15/2017 CLINICAL DATA:  83 y/o  M; abnormal liver function. EXAM: ABDOMEN ULTRASOUND COMPLETE COMPARISON:  11/20/2009 CT abdomen and pelvis. FINDINGS: Gallbladder: No gallstones or wall thickening visualized. No sonographic Murphy sign noted by sonographer. Common bile duct: Diameter: 6 mm Liver: No focal lesion identified. Within normal limits in parenchymal echogenicity. IVC: No abnormality visualized. Pancreas: Visualized portion unremarkable. Spleen: Size and appearance within normal limits. Right Kidney: Length: 10.8 cm. Anechoic well-circumscribed cyst within the lower pole of the kidney measuring 6.8 cm. No septation or solid components identified. Left Kidney: Length: 13.4 cm. Anechoic well-circumscribed cyst within the interpolar region measuring 9.3 cm. No septations or solid components identified. Abdominal aorta: No aneurysm visualized. Other findings: Right pleural effusion. IMPRESSION: 1. Small right pleural effusion. 2. Large simple  renal cysts as seen on prior CT of abdomen and pelvis. 3. Otherwise unremarkable abdominal sonogram. Electronically Signed   By: Kristine Garbe M.D.   On: 01/15/2017 02:19   Dg Chest Port 1 View  Result Date: 01/16/2017 CLINICAL DATA:  Shortness of breath and cough EXAM: PORTABLE CHEST 1 VIEW COMPARISON:  Portable chest x-ray of January 14, 2017 as well as PA and lateral chest x-rays of July 21, 2015 and March 20, 2014 FINDINGS: The lungs are well-expanded. The interstitial markings are diffusely increased and unchanged since yesterday's study. There is no alveolar infiltrate or pleural effusion. The cardiac silhouette remains enlarged and the pulmonary vascularity engorged. No significant pleural effusion is observed. IMPRESSION: CHF with diffuse interstitial edema. Diffuse pneumonia is felt less likely. There has not been significant change since yesterday's study. Electronically Signed   By: David  Martinique M.D.   On: 01/16/2017 07:30   Dg Chest Port 1 View  Result Date: 02/05/2017 CLINICAL DATA:  83 year old male with worsening shortness of breath for 4 days. Right ankle surgery 2 weeks ago. Lethargy. Vomiting. EXAM: PORTABLE CHEST 1 VIEW COMPARISON:  Chest radiographs 07/21/2015. FINDINGS: Portable AP upright view at 1827 hours. Mildly lower lung volumes. Chronic moderate cardiomegaly and tortuous thoracic aorta. Mediastinal contours appear stable. Calcified aortic atherosclerosis. Visualized tracheal air column is within normal limits. Chronic but increased bilateral pulmonary interstitial opacity, worse on the left. No pneumothorax, pleural effusion or consolidation. IMPRESSION: 1. Chronic but increased pulmonary interstitial opacity, worse on the left. Top differential considerations include asymmetric pulmonary edema and viral/atypical respiratory infection. 2. Moderate cardiomegaly appears stable since 2017. Electronically Signed   By: Genevie Ann M.D.   On: 02/07/2017 18:40   Dg Swallowing  Func-speech Pathology  Result Date: 01/15/2017 Objective Swallowing Evaluation: Type of Study: MBS-Modified Barium Swallow Study Patient Details Name: DYSHON PHILBIN MRN: [id number] Date of Birth: Dec 01, 1926 Today's Date: 01/15/2017 Time: SLP Start Time (ACUTE ONLY): 1305-SLP Stop Time (ACUTE ONLY): 1330 SLP Time Calculation (min) (ACUTE ONLY): 25 min Past Medical History: Past Medical History: Diagnosis Date . Anemia  . Barrett's esophagus  . BENIGN PROSTATIC HYPERTROPHY  . Chronic anticoagulation   a. on Coumadin . CORONARY ARTERY DISEASE   a. s/p stent OM 2002. b. s/p BMS 2006 RCA  c. s/p cath 2007. [name] DEPRESSION  . DIVERTICULOSIS, COLON  . GERD  . GYNECOMASTIA, UNILATERAL  . Hematoma of abdominal wall  . History of echocardiogram   a. EF 55% (TEE 06/2014) . HYPERLIPIDEMIA  . HYPERTENSION  . HYPOTHYROIDISM  . Mallory - Weiss tear   a. > 5 years ago . Mitral regurgitation   a. Mild-mod by TEE 06/2014. [name] Paroxysmal A-fib (HCC)   a. chronic amiodarone and coumadin;  b. 05/2013 s/p DCCV. c. 06/2014 s/p TEE/DCCV. [name] Popliteal artery embolism, right ([place])  . SLEEP APNEA   CPAP Past Surgical History: Past Surgical History: Procedure Laterality Date . cardioversion  8/08 . CARDIOVERSION  09/14/2011  Procedure: CARDIOVERSION;  Surgeon: Josue Hector, MD;  Location: [place];  Service: Cardiovascular;  Laterality: N/A; . CARDIOVERSION N/A 03/23/2014  Procedure: CARDIOVERSION;  Surgeon: Thompson Grayer, MD;  Location: [place];  Service: Cardiovascular;  Laterality: N/A; . CARDIOVERSION N/A 06/22/2014  Procedure: CARDIOVERSION;  Surgeon: [name], MD;  Location: [facility] ENDOSCOPY;  Service: Cardiovascular;  Laterality: N/A; . CARDIOVERSION N/A 04/27/2015  Procedure: CARDIOVERSION;  Surgeon: Josue Hector, MD;  Location: [facility] ENDOSCOPY;  Service: Cardiovascular;  Laterality: N/A; . CARDIOVERSION N/A 07/16/2015  Procedure: CARDIOVERSION;  Surgeon: [name], MD;  Location: [place];  Service: Cardiovascular;  Laterality:  N/A; . CARDIOVERSION N/A 11/26/2015  Procedure: CARDIOVERSION;  Surgeon: Sanda Klein, MD;  Location: [facility] ENDOSCOPY;  Service: Cardiovascular;  Laterality: N/A; . CARDIOVERSION N/A 05/17/2016  Procedure: CARDIOVERSION;  Surgeon: Thayer Headings, MD;  Location: [place];  Service: Cardiovascular;  Laterality: N/A; . CARDIOVERSION N/A 09/20/2016  Procedure: CARDIOVERSION;  Surgeon: [name], MD;  Location: [facility] ENDOSCOPY;  Service: Cardiovascular;  Laterality: N/A; . CARDIOVERSION N/A 11/09/2016  Procedure: CARDIOVERSION;  Surgeon: [name], MD;  Location: [facility] ENDOSCOPY;  Service: Cardiovascular;  Laterality: N/A; . CORONARY ANGIOPLASTY WITH STENT PLACEMENT   . ESOPHAGOGASTRODUODENOSCOPY   . ORIF ANKLE FRACTURE Right 01/06/2017  Procedure: OPEN REDUCTION INTERNAL FIXATION (ORIF) ANKLE FRACTURE, RIGHT;  Surgeon: Mcarthur Rossetti, MD;  Location: [place];  Service: Orthopedics;  Laterality: Right; . TEE WITH CARDIOVERSION  7/08 . TEE WITHOUT CARDIOVERSION  09/14/2011  Procedure: TRANSESOPHAGEAL ECHOCARDIOGRAM (TEE);  Surgeon: Josue Hector, MD;  Location: [facility] ENDOSCOPY;  Service: Cardiovascular;  Laterality: N/A; . TEE WITHOUT CARDIOVERSION N/A 06/22/2014  Procedure: TRANSESOPHAGEAL ECHOCARDIOGRAM (TEE);  Surgeon: [name], MD;  Location[facility] ENDOSCOPY;  Service: Cardiovascular;  Laterality: N/A; . TEE WITHOUT CARDIOVERSION N/A 05/17/2016  Procedure: TRANSESOPHAGEAL ECHOCARDIOGRAM (TEE);  Surgeon: Thayer Headings, MD;  Location: [place];  Service: Cardiovascular;  Laterality: N/A; . TEE WITHOUT CARDIOVERSION N/A 09/20/2016  Procedure: TRANSESOPHAGEAL ECHOCARDIOGRAM (TEE);  Surgeon: [name], MD;  Location: [facility] ENDOSCOPY;  Service: Cardiovascular;  Laterality: N/A; . TRANSURETHRAL RESECTION OF PROSTATE   HPI: Pt is an 83 y.o. male with PMH of Hypertension, hyperlipidemia, GERD, hypothyroidism, depression, anxiety, OSA on CPAP, A. fib on  Coumadin, popliteal artery embolism, MVvalve  regurgitation, CAD, s/p of stent placement, BPH, iron deficiency anemia, CKD-3, who presented to ED 7/1 with confusion, cough, shortness breath, nausea, vomiting and diarrhea. Pt recently hospitalized for right tibia/fibular fracture from 6/22-6/25. Patient had surgery, and was discharged to rehabilitation facility. Per his son, patient become confused in the past 3 days. He is lethargic, and has poor appetite and decreased oral intake, generalized weakness, but no unilateral weakness, numbness or tingling to extremities. Patient has dry cough and shortness of breath, but no chest pain. Per his son. Pt has oxygen desaturation to 60-70%. He has fever and chills. Patient hasnausea and vomiting. CXR showed "chronic but increased pulmonary interstitial opacity, worse on the left. Top differential considerations include asymmetric pulmonary edema and viral/atypical respiratory infection." Abdominal CT showed R pleural effusion. Head CT negative for acute findings. A RN swallow screen was completed; pt had difficulty with water via straw along with fine crackles. Bedside swallow eval ordered.   No Data Recorded Assessment / Plan / Recommendation CHL IP CLINICAL IMPRESSIONS 01/15/2017 Clinical Impression Pt with oropharyngeal swallow within functional limits; no penetration or aspiration observed, no delay in swallow initiation. Pt chewed up barium tablet; esophageal sweep revealed stasis of material in the mid-lower esophagus. Pt has hx of Barrett's esophagus and GERD, along with decreased respiratory status, which increases risk of aspiration. Recommend advancing diet to regular/ thin liquids with reflux precautions- sit upright during and after meals, small bites/ sips, alternate food with liquid. SLP will f/u x1 for diet tolerance. Pt may benefit from further esophageal assessment.  SLP Visit Diagnosis Dysphagia, unspecified (R13.10) Attention and concentration deficit following -- Frontal lobe and executive function  deficit following -- Impact on safety and function Mild aspiration risk;Moderate aspiration risk   CHL IP TREATMENT RECOMMENDATION 01/15/2017 Treatment Recommendations Therapy as outlined in treatment plan below   No flowsheet data found. CHL IP DIET RECOMMENDATION 01/15/2017 SLP Diet Recommendations Regular solids;Thin liquid Liquid Administration via Cup;Straw Medication Administration Whole meds with liquid Compensations Slow rate;Small sips/bites;Follow solids with liquid Postural Changes Remain semi-upright after after feeds/meals (Comment);Seated upright at 90 degrees   CHL IP OTHER RECOMMENDATIONS 01/15/2017 Recommended Consults Consider esophageal assessment Oral Care Recommendations Oral care BID Other Recommendations --   CHL IP FOLLOW UP RECOMMENDATIONS 01/15/2017 Follow up Recommendations None   CHL IP FREQUENCY AND DURATION 01/15/2017 Speech Therapy Frequency (ACUTE ONLY) min 1 x/week Treatment Duration 1 week      CHL IP ORAL PHASE 01/15/2017 Oral Phase WFL Oral - Pudding Teaspoon -- Oral - Pudding Cup -- Oral - Honey Teaspoon -- Oral - Honey Cup -- Oral - Nectar Teaspoon -- Oral - Nectar Cup -- Oral - Nectar Straw -- Oral - Thin Teaspoon -- Oral - Thin Cup -- Oral - Thin Straw -- Oral - Puree -- Oral - Mech Soft -- Oral - Regular -- Oral - Multi-Consistency -- Oral - Pill -- Oral Phase - Comment --  CHL IP PHARYNGEAL PHASE 01/15/2017 Pharyngeal Phase WFL Pharyngeal- Pudding Teaspoon -- Pharyngeal -- Pharyngeal- Pudding Cup -- Pharyngeal -- Pharyngeal- Honey Teaspoon -- Pharyngeal -- Pharyngeal- Honey Cup -- Pharyngeal -- Pharyngeal- Nectar Teaspoon -- Pharyngeal -- Pharyngeal- Nectar Cup -- Pharyngeal -- Pharyngeal- Nectar Straw -- Pharyngeal -- Pharyngeal- Thin Teaspoon -- Pharyngeal -- Pharyngeal- Thin Cup -- Pharyngeal -- Pharyngeal- Thin Straw -- Pharyngeal -- Pharyngeal- Puree -- Pharyngeal -- Pharyngeal- Mechanical Soft -- Pharyngeal -- Pharyngeal- Regular -- Pharyngeal -- Pharyngeal- Multi-consistency --  Pharyngeal -- Pharyngeal-  Pill -- Pharyngeal -- Pharyngeal Comment --  CHL IP CERVICAL ESOPHAGEAL PHASE 01/15/2017 Cervical Esophageal Phase (No Data) Pudding Teaspoon -- Pudding Cup -- Honey Teaspoon -- Honey Cup -- Nectar Teaspoon -- Nectar Cup -- Nectar Straw -- Thin Teaspoon -- Thin Cup -- Thin Straw -- Puree -- Mechanical Soft -- Regular -- Multi-consistency -- Pill -- Cervical Esophageal Comment -- No flowsheet data found. Amy Dionicia Abler, MA, CCC-SLP 01/15/2017, 1:40 PM [id number]                  Scheduled Meds: . clonazePAM  0.5 mg Oral QHS  . diltiazem  180 mg Oral Daily  . finasteride  5 mg Oral Daily  . levothyroxine  50 mcg Oral Once per day on Sun Mon Wed Fri  . levothyroxine  75 mcg Oral Once per day on Tue Thu Sat  . multivitamin with minerals  1 tablet Oral Daily  . omega-3 acid ethyl esters  1 g Oral Daily  . pantoprazole  40 mg Oral BID  . pravastatin  40 mg Oral QPM  . saccharomyces boulardii  250 mg Oral BID  . warfarin  2 mg Oral ONCE-1800  . Warfarin - Pharmacist Dosing Inpatient   Does not apply q1800   Continuous Infusions: . sodium chloride 100 mL/hr at 01/16/17 0656  . piperacillin-tazobactam (ZOSYN)  IV 3.375 g (01/16/17 0656)  . vancomycin       LOS: 2 days    Time spent: 40 minutes    Kalyb Pemble, Geraldo Docker, MD [facility] Pager [phone]   If 7PM-7AM, please contact night-coverage [email or web address] Password [facility] 01/16/2017, 8:42 AM

## 2017-01-16 NOTE — Progress Notes (Signed)
PHARMACY - PHYSICIAN COMMUNICATION CRITICAL VALUE ALERT - BLOOD CULTURE IDENTIFICATION (BCID)  No results found for this or any previous visit.  Name of physician (or Provider) Contacted: Woods  Changes to prescribed antibiotics required: with GPC in 1/2 blood cultures, vancomycin was added back.   Lab unable to perform BCID sensitivities on sample.  Georgina Peer 01/16/2017  8:00 AM

## 2017-01-16 NOTE — Progress Notes (Signed)
  Echocardiogram 2D Echocardiogram has been performed.  [name] 01/16/2017, 9:26 AM

## 2017-01-16 NOTE — Progress Notes (Signed)
Pharmacy Antibiotic Note  [name] is a 83 y.o. male admitted on 01/24/2017 with sepsis 2/2 HCAP vs UTI.  Pharmacy has been consulted for zosyn dosing.   Patient afebrile overnight, wbc 13. Renal function slightly improved.  Received call from microlab, GPC in 1/2 bld cxs, first run of BCID error'ed out and unable to rerun sample d/t extended time since sample drawn so unknown if resistance is present. D/w Dr.Woods this am will restart vancomycin until sensitivities are back.  Plan: Resart vancomycin 1250mg  IV q24  Zosyn 3.375gm IV q8h F/u renal fxn, C&S, clinical status   Height: 5\' 11"  (180.3 cm) Weight: 196 lb 12.8 oz (89.3 kg) IBW/kg (Calculated) : 75.3  Temp (24hrs), Avg:97.8 F (36.6 C), Min:97.8 F (36.6 C), Max:97.9 F (36.6 C)   Recent Labs Lab 02/02/2017 1756 02/11/2017 1804 01/27/2017 1957 01/20/2017 2354 01/15/17 0028 01/15/17 1449 01/16/17 0253  WBC 14.9*  --   --   --   --   --  13.1*  CREATININE 2.90*  --   --   --  2.40* 1.81* 1.59*  LATICACIDVEN  --  1.69 1.6 1.9  --   --   --     Estimated Creatinine Clearance: 34.9 mL/min (A) (by C-G formula based on SCr of 1.59 mg/dL (H)).    Allergies  Allergen Reactions  . Metoprolol Tartrate Other (See Comments)    Anxiety, heart rate goes too low, feels like in a fog.    Antimicrobials this admission: Vanc 7/1>>  Zosyn 7/1>>   Dose adjustments this admission: N/A  Microbiology results: 7/2 Legionella UAg >> in process 7/2 Strep pneumo UAg >> negative 7/2 Respiratory panel >> sent 7/2 MRSA PCR >> negative 7/1 BCxr >> 1/2 GPC 7/1 UCxr >> sent  Thank you for allowing pharmacy to be a part of this patient's care.  Erin Hearing PharmD., BCPS Clinical Pharmacist Pager [phone] 01/16/2017 8:00 AM

## 2017-01-17 DIAGNOSIS — K7689 Other specified diseases of liver: Secondary | ICD-10-CM

## 2017-01-17 LAB — BASIC METABOLIC PANEL
Anion gap: 8 (ref 5–15)
BUN: 27 mg/dL — AB (ref 6–20)
CALCIUM: 8.1 mg/dL — AB (ref 8.9–10.3)
CHLORIDE: 105 mmol/L (ref 101–111)
CO2: 23 mmol/L (ref 22–32)
CREATININE: 1.27 mg/dL — AB (ref 0.61–1.24)
GFR calc Af Amer: 57 mL/min — ABNORMAL LOW (ref >60)
GFR calc non Af Amer: 49 mL/min — ABNORMAL LOW (ref >60)
Glucose, Bld: 117 mg/dL — ABNORMAL HIGH (ref 65–99)
Potassium: 3.7 mmol/L (ref 3.5–5.1)
SODIUM: 136 mmol/L (ref 135–145)

## 2017-01-17 LAB — MAGNESIUM: MAGNESIUM: 1.9 mg/dL (ref 1.7–2.4)

## 2017-01-17 LAB — LIPID PANEL
CHOL/HDL RATIO: 3.4 ratio
Cholesterol: 86 mg/dL (ref 0–200)
HDL: 25 mg/dL — AB (ref >40)
LDL CALC: 52 mg/dL (ref 0–99)
Triglycerides: 43 mg/dL (ref <150)
VLDL: 9 mg/dL (ref 0–40)

## 2017-01-17 LAB — CBC
HCT: 29.5 % — ABNORMAL LOW (ref 39.0–52.0)
Hemoglobin: 9.9 g/dL — ABNORMAL LOW (ref 13.0–17.0)
MCH: 31 pg (ref 26.0–34.0)
MCHC: 33.6 g/dL (ref 30.0–36.0)
MCV: 92.5 fL (ref 78.0–100.0)
PLATELETS: 226 10*3/uL (ref 150–400)
RBC: 3.19 MIL/uL — ABNORMAL LOW (ref 4.22–5.81)
RDW: 14.6 % (ref 11.5–15.5)
WBC: 12.5 10*3/uL — ABNORMAL HIGH (ref 4.0–10.5)

## 2017-01-17 LAB — PROTIME-INR
INR: 2.75
Prothrombin Time: 29.7 seconds — ABNORMAL HIGH (ref 11.4–15.2)

## 2017-01-17 LAB — LACTIC ACID, PLASMA: Lactic Acid, Venous: 1.1 mmol/L (ref 0.5–1.9)

## 2017-01-17 MED ORDER — SODIUM CHLORIDE 0.9 % IV BOLUS (SEPSIS)
250.0000 mL | Freq: Once | INTRAVENOUS | Status: AC
  Administered 2017-01-17 (×2): 250 mL via INTRAVENOUS

## 2017-01-17 MED ORDER — FUROSEMIDE 10 MG/ML IJ SOLN
40.0000 mg | Freq: Once | INTRAMUSCULAR | Status: AC
  Administered 2017-01-17: 40 mg via INTRAVENOUS

## 2017-01-17 MED ORDER — DILTIAZEM HCL 60 MG PO TABS
60.0000 mg | ORAL_TABLET | Freq: Four times a day (QID) | ORAL | Status: DC
  Administered 2017-01-17 – 2017-01-19 (×8): 60 mg via ORAL
  Filled 2017-01-17 (×8): qty 1

## 2017-01-17 MED ORDER — WARFARIN SODIUM 2 MG PO TABS
2.0000 mg | ORAL_TABLET | Freq: Once | ORAL | Status: AC
  Administered 2017-01-17: 2 mg via ORAL
  Filled 2017-01-17: qty 1

## 2017-01-17 MED ORDER — BISACODYL 10 MG RE SUPP
10.0000 mg | Freq: Once | RECTAL | Status: AC
  Administered 2017-01-17: 10 mg via RECTAL
  Filled 2017-01-17: qty 1

## 2017-01-17 NOTE — Progress Notes (Signed)
ANTICOAGULATION CONSULT NOTE - Follow up [place] for warfarin Indication: atrial fibrillation  Allergies  Allergen Reactions  . Metoprolol Tartrate Other (See Comments)    Anxiety, heart rate goes too low, feels like in a fog.    Patient Measurements: Height: 5\' 11"  (180.3 cm) Weight: 195 lb (88.5 kg) IBW/kg (Calculated) : 75.3  Vital Signs: Temp: 98.2 F (36.8 C) (07/04 0750) Temp Source: Axillary (07/04 0750) BP: 98/79 (07/04 0816) Pulse Rate: 116 (07/04 0750)  Labs:  Recent Labs  02/08/2017 1756  01/15/17 0028 01/15/17 0732 01/15/17 1449 01/16/17 0253 01/17/17 0358  HGB 9.4*  --   --   --   --  9.3* 9.9*  HCT 27.4*  --   --   --   --  28.2* 29.5*  PLT 244  --   --   --   --  225 226  LABPROT 28.1*  --   --  27.3*  --  29.7* 29.7*  INR 2.57  --   --  2.48  --  2.76 2.75  CREATININE 2.90*  --  2.40*  --  1.81* 1.59* 1.27*  TROPONINI 0.04*  < > 0.04* 0.10*  --  0.08*  --   < > = values in this interval not displayed.  Estimated Creatinine Clearance: 43.6 mL/min (A) (by C-G formula based on SCr of 1.27 mg/dL (H)).  Medications:  Warfarin PTA 2mg  daily  Assessment: 66 yoM on chronic coumadin for history of afib. INR was therapeutic upon admission 2.57 and remains therapeutic today at 2.75 despite missing dose on 7/1. Elevated LFTs now resolving, and Zosyn recently started - watch INR and S/Sx bleeding closely. Hgb low but stable.  Goal of Therapy:  INR 2-3 Monitor platelets by anticoagulation protocol: Yes   Plan:  -Warfarin 2mg  PO x 1 tonight -Monitor INR, CBC, S/Sx bleeding daily  Arrie Senate, PharmD PGY-2 Cardiology Pharmacy Resident Pager: [phone] 01/17/2017

## 2017-01-17 NOTE — Progress Notes (Signed)
[name] - Stepdown/ICU TEAM  [name]  AJO:[id number] DOB: 09-26-1926 DOA: 01/29/2017 PCP: Marletta Lor, MD    Brief Narrative:  83 y.o. male with history of Hypertension, hyperlipidemia, GERD, hypothyroidism, depression, anxiety, OSA on CPAP, A. fib on Coumadin, popliteal artery embolism, MV regurg, CAD s/p stent placement, BPH, iron deficiency anemia, and CKD3 who presented with confusion, cough, shortness breath, and nausea w/ some limited vomiting.  Pt was hospitalized for a R tibia/fib fracture 6/22-6/25. Patient had surgery, and was discharged to a rehab facility. Per his son he began to be confused and lethargic, w/ poor appetite and decreased oral intake, as well as oxygen desaturations to 60-70%.   ED w/u revealed WBC 14.9, sodium 128, AST 594, ALT 338, total bilirubin [name].8, tachycardia, and tachypnea. CXR showed interstitial opacity, worse on the left. .  Subjective: The patient is feeling better overall.  He reports some ongoing shortness of breath but states this is better than earlier in his hospital stay.  He denies abdominal pain nausea vomiting or chest pain.  Assessment & Plan:  Sepsis v/s SIRS - ?Gram + bacteremia ([name] of 2 blood cx) Unclear if this patient is actually suffering with an infectious disease versus SIRS physiology of noninfectious etiology - continue workup  - continue broad-spectrum antibiotic for now - I suspect his blood cx is a contaminant, and I doubt he has actually had an acute infection   Acute hypoxic respiratory failure follow-up chest x-ray after hydration noted only diffuse pulmonary edema - gently diurese again today - difficult to achieve volume balance in setting of RV failure/Pulm HTN   Transaminitis possible shock liver - acute viral hepatitis panel negative - no evidence of acute gallbladder disease on ultrasound - steadily improving  Acute metabolic encephalopathy no acute findings on CT head  - suspect this was related to  dehydration and hyponatremia  - N-47 and folic acid levels normal - perhaps related to trazodone and hydrocodone administered in the skilled nursing facility as well - now resolved   Hyponatremia Sodium has normalized  Recent Labs Lab 02/07/2017 1756 01/15/17 0028 01/15/17 1449 01/16/17 0253 01/17/17 0358  NA 128* 127* 132* 133* 136   Acute renal injury on CKD Stage 3 Baseline crt ~[name].2 - suspect pre-renal azotemia - crt normalizing w/ hydration   Recent Labs Lab 02/04/2017 1756 01/15/17 0028 01/15/17 1449 01/16/17 0253 01/17/17 0358  CREATININE 2.90* 2.40* [name].81* [name].59* [name].27*    Parox Afib on coumadin CHA2DS2 VASc score is 5 - s/p TEE DCCV March 2018 - is intolerant to BBs per Cardiology outpt notes - evaluated by EP May 2018 at which time amio was noted to not be working for his afib - attempt to transition back to oral cardizem today w/ rate now better controlled   R heart failure - Mitral valve regurg - Tricuspid valve regurg TTE notes severe LAE, mode RAE, moderate RVE, and mild RV systolic dysfxn w/ moderate MR and TR - RVSP noted to be 52 - volume proving difficult to balance w/ pt swinging quickly from [facility] to volume overload - gently attempting to diurese at present - hesitant to use scheduled lasix w/ propensity for [facility]  Filed Weights   01/15/17 0612 01/16/17 0401 01/17/17 0543  Weight: 89.8 kg (198 lb) 89.3 kg (196 lb 12.8 oz) 88.5 kg (195 lb)    Hypothyroidism continue home Synthroid  R closed Tib/Fib fx no evidence of acute complications at this time - continue PT/OT -  due for Ortho f/u 7/10  HLD  HTN Not an active problem at this time likely related to dehydration  - follow trend  CAD Asymptomatic presently - followed by Dr. Percival Spanish in the outpatient setting  GERD  OSA Home CPAP   DVT prophylaxis: warfarin Code Status: DNR - NO CODE Family Communication: Spoke with son and daughter-in-law at bedside at length Disposition Plan: SDU  Consultants:    none  Procedures: none  Antimicrobials:  Zosyn 7/[name] > Vancomycin 7/[name] >  Objective: Blood pressure 98/79, pulse (!) 118, temperature 98.2 F (36.8 C), temperature source Axillary, resp. rate (!) 28, height 5\' 11"  ([name].803 m), weight 88.5 kg (195 lb), SpO2 91 %.  Intake/Output Summary (Last 24 hours) at 01/17/17 1109 Last data filed at 01/17/17 1000  Gross per 24 hour  Intake          3043.75 ml  Output             1800 ml  Net          1243.75 ml   Filed Weights   01/15/17 0612 01/16/17 0401 01/17/17 0543  Weight: 89.8 kg (198 lb) 89.3 kg (196 lb 12.8 oz) 88.5 kg (195 lb)    Examination: General: Alert and conversant  - no resp distress Lungs: Diffuse fine crackles - no wheezing Cardiovascular: irregularly irregular - rate controlled at 90 bpm Abdomen: Nontender, nondistended, soft, bowel sounds positive, no rebound, no ascites, no appreciable mass Extremities: Right lower extremity in cast - left lower extremity with [name]+ edema  CBC:  Recent Labs Lab 02/08/2017 1756 01/16/17 0253 01/17/17 0358  WBC 14.9* 13.[name]* 12.5*  NEUTROABS 12.3*  --   --   HGB 9.4* 9.3* 9.9*  HCT 27.4* 28.2* 29.5*  MCV 90.7 91.9 92.5  PLT 244 225 347   Basic Metabolic Panel:  Recent Labs Lab 01/22/2017 1756 01/15/17 0028 01/15/17 1449 01/16/17 0253 01/17/17 0358  NA 128* 127* 132* 133* 136  K 4.0 3.8 3.8 3.6 3.7  CL 97* 101 101 103 105  CO2 22 17* 18* 22 23  GLUCOSE 144* 126* 147* 139* 117*  BUN 44* 43* 37* 38* 27*  CREATININE 2.90* 2.40* [name].81* [name].59* [name].27*  CALCIUM 7.8* 7.6* 7.9* 8.0* 8.[name]*  MG  --   --   --   --  [name].9   GFR: Estimated Creatinine Clearance: 43.6 mL/min (A) (by C-G formula based on SCr of [name].27 mg/dL (H)).  Liver Function Tests:  Recent Labs Lab 02/04/2017 1756 01/15/17 1449 01/16/17 0253  AST 594* 215* 147*  ALT 338* 271* 230*  ALKPHOS 136* 151* 148*  BILITOT [name].8* [name].8* [name].6*  PROT 5.6* 6.6 5.4*  ALBUMIN 2.4* 2.3* 2.[name]*    Recent Labs Lab 01/31/2017 2029   LIPASE 19    Recent Labs Lab 01/28/2017 1952  AMMONIA <9*    Coagulation Profile:  Recent Labs Lab 02/05/2017 1756 01/15/17 0732 01/16/17 0253 01/17/17 0358  INR 2.57 2.48 2.76 2.75    Cardiac Enzymes:  Recent Labs Lab 02/13/2017 1756 01/24/2017 2029 01/15/17 0028 01/15/17 0732 01/16/17 0253  TROPONINI 0.04* 0.05* 0.04* 0.10* 0.08*    Recent Results (from the past 240 hour(s))  Urine Culture     Status: None   Collection Time: 01/24/2017  [name]:25 AM  Result Value Ref Range Status   Specimen Description URINE, RANDOM  Final   Special Requests NONE  Final   Culture NO GROWTH  Final   Report Status 01/16/2017 FINAL  Final  Blood Culture (routine x 2)     Status: None (Preliminary result)   Collection Time: 01/22/2017  5:36 PM  Result Value Ref Range Status   Specimen Description BLOOD LEFT FOREARM  Final   Special Requests IN PEDIATRIC BOTTLE Blood Culture adequate volume  Final   Culture  Setup Time   Final    GRAM POSITIVE COCCI IN CLUSTERS IN PEDIATRIC BOTTLE CRITICAL RESULT CALLED TO, READ BACK BY AND VERIFIED WITH: Ophelia Shoulder, AT [phone] 01/16/17 BY D. VANHOOK    Culture   Final    GRAM POSITIVE COCCI CULTURE REINCUBATED FOR BETTER GROWTH    Report Status PENDING  Incomplete  Blood Culture (routine x 2)     Status: None (Preliminary result)   Collection Time: 02/13/2017  5:56 PM  Result Value Ref Range Status   Specimen Description BLOOD LEFT WRIST  Final   Special Requests   Final    BOTTLES DRAWN AEROBIC AND ANAEROBIC Blood Culture adequate volume   Culture NO GROWTH 3 DAYS  Final   Report Status PENDING  Incomplete  Respiratory Panel by PCR     Status: None   Collection Time: 01/15/17 12:42 AM  Result Value Ref Range Status   Adenovirus NOT DETECTED NOT DETECTED Final   Coronavirus 229E NOT DETECTED NOT DETECTED Final   Coronavirus HKU1 NOT DETECTED NOT DETECTED Final   Coronavirus NL63 NOT DETECTED NOT DETECTED Final   Coronavirus OC43 NOT DETECTED NOT  DETECTED Final   Metapneumovirus NOT DETECTED NOT DETECTED Final   Rhinovirus / Enterovirus NOT DETECTED NOT DETECTED Final   Influenza A NOT DETECTED NOT DETECTED Final   Influenza B NOT DETECTED NOT DETECTED Final   Parainfluenza Virus [name] NOT DETECTED NOT DETECTED Final   Parainfluenza Virus 2 NOT DETECTED NOT DETECTED Final   Parainfluenza Virus 3 NOT DETECTED NOT DETECTED Final   Parainfluenza Virus 4 NOT DETECTED NOT DETECTED Final   Respiratory Syncytial Virus NOT DETECTED NOT DETECTED Final   Bordetella pertussis NOT DETECTED NOT DETECTED Final   Chlamydophila pneumoniae NOT DETECTED NOT DETECTED Final   Mycoplasma pneumoniae NOT DETECTED NOT DETECTED Final  MRSA PCR Screening     Status: None   Collection Time: 01/15/17 12:44 AM  Result Value Ref Range Status   MRSA by PCR NEGATIVE NEGATIVE Final    Comment:        The GeneXpert MRSA Assay (FDA approved for NASAL specimens only), is one component of a comprehensive MRSA colonization surveillance program. It is not intended to diagnose MRSA infection nor to guide or monitor treatment for MRSA infections.      Scheduled Meds: . clonazePAM  0.5 mg Oral QHS  . doxazosin  [name] mg Oral Daily  . finasteride  5 mg Oral Daily  . levothyroxine  50 mcg Oral Once per day on Sun Mon Wed Fri  . levothyroxine  75 mcg Oral Once per day on Tue Thu Sat  . megestrol  800 mg Oral Daily  . multivitamin with minerals  [name] tablet Oral Daily  . omega-3 acid ethyl esters  [name] g Oral Daily  . pantoprazole  40 mg Oral BID  . pravastatin  40 mg Oral QPM  . saccharomyces boulardii  250 mg Oral BID  . warfarin  2 mg Oral ONCE-1800  . Warfarin - Pharmacist Dosing Inpatient   Does not apply q1800     LOS: 3 days   Cherene Altes, MD [facility] Office  [phone] Pager -  Text Page per Shea Evans as per below:  On-Call/Text Page:      [email or web address]      password [email or web address]  If 7PM-7AM, please contact night-coverage [email or web address] Password  [email or web address] 01/17/2017, 11:09 AM

## 2017-01-17 NOTE — Care Management Important Message (Signed)
Important Message  Patient Details  Name: [name] MRN: [id number] Date of Birth: 1927-06-16   Medicare Important Message Given:  Yes    [name] Abena 01/17/2017, 9:27 AM

## 2017-01-18 DIAGNOSIS — N401 Enlarged prostate with lower urinary tract symptoms: Secondary | ICD-10-CM

## 2017-01-18 DIAGNOSIS — R338 Other retention of urine: Secondary | ICD-10-CM

## 2017-01-18 DIAGNOSIS — S82201A Unspecified fracture of shaft of right tibia, initial encounter for closed fracture: Secondary | ICD-10-CM

## 2017-01-18 DIAGNOSIS — N170 Acute kidney failure with tubular necrosis: Secondary | ICD-10-CM

## 2017-01-18 DIAGNOSIS — J810 Acute pulmonary edema: Secondary | ICD-10-CM

## 2017-01-18 DIAGNOSIS — S82401A Unspecified fracture of shaft of right fibula, initial encounter for closed fracture: Secondary | ICD-10-CM

## 2017-01-18 LAB — POTASSIUM
POTASSIUM: 3 mmol/L — AB (ref 3.5–5.1)
POTASSIUM: 2.9 mmol/L — AB (ref 3.5–5.1)

## 2017-01-18 LAB — BASIC METABOLIC PANEL
ANION GAP: 8 (ref 5–15)
BUN: 22 mg/dL — ABNORMAL HIGH (ref 6–20)
CALCIUM: 7.9 mg/dL — AB (ref 8.9–10.3)
CO2: 24 mmol/L (ref 22–32)
CREATININE: 1.29 mg/dL — AB (ref 0.61–1.24)
Chloride: 102 mmol/L (ref 101–111)
GFR calc non Af Amer: 48 mL/min — ABNORMAL LOW (ref >60)
GFR, EST AFRICAN AMERICAN: 56 mL/min — AB (ref >60)
Glucose, Bld: 103 mg/dL — ABNORMAL HIGH (ref 65–99)
Potassium: 3.1 mmol/L — ABNORMAL LOW (ref 3.5–5.1)
SODIUM: 134 mmol/L — AB (ref 135–145)

## 2017-01-18 LAB — CULTURE, BLOOD (ROUTINE X 2): SPECIAL REQUESTS: ADEQUATE

## 2017-01-18 LAB — PROTIME-INR
INR: 3.04
PROTHROMBIN TIME: 32.1 s — AB (ref 11.4–15.2)

## 2017-01-18 LAB — MAGNESIUM
MAGNESIUM: 1.7 mg/dL (ref 1.7–2.4)
MAGNESIUM: 2.2 mg/dL (ref 1.7–2.4)

## 2017-01-18 LAB — CBC
HCT: 29.1 % — ABNORMAL LOW (ref 39.0–52.0)
Hemoglobin: 9.8 g/dL — ABNORMAL LOW (ref 13.0–17.0)
MCH: 30.7 pg (ref 26.0–34.0)
MCHC: 33.7 g/dL (ref 30.0–36.0)
MCV: 91.2 fL (ref 78.0–100.0)
PLATELETS: 244 10*3/uL (ref 150–400)
RBC: 3.19 MIL/uL — AB (ref 4.22–5.81)
RDW: 14.3 % (ref 11.5–15.5)
WBC: 12.3 10*3/uL — AB (ref 4.0–10.5)

## 2017-01-18 MED ORDER — POTASSIUM CHLORIDE CRYS ER 20 MEQ PO TBCR
50.0000 meq | EXTENDED_RELEASE_TABLET | Freq: Once | ORAL | Status: AC
  Administered 2017-01-18: 13:00:00 50 meq via ORAL
  Filled 2017-01-18: qty 1

## 2017-01-18 MED ORDER — FUROSEMIDE 10 MG/ML IJ SOLN
40.0000 mg | Freq: Once | INTRAMUSCULAR | Status: AC
  Administered 2017-01-18: 40 mg via INTRAVENOUS
  Filled 2017-01-18: qty 4

## 2017-01-18 MED ORDER — POTASSIUM CHLORIDE CRYS ER 20 MEQ PO TBCR
60.0000 meq | EXTENDED_RELEASE_TABLET | Freq: Once | ORAL | Status: AC
  Administered 2017-01-18: 60 meq via ORAL
  Filled 2017-01-18: qty 3

## 2017-01-18 MED ORDER — MAGNESIUM SULFATE 2 GM/50ML IV SOLN
2.0000 g | Freq: Once | INTRAVENOUS | Status: AC
  Administered 2017-01-18: 2 g via INTRAVENOUS
  Filled 2017-01-18: qty 50

## 2017-01-18 MED ORDER — WARFARIN SODIUM 1 MG PO TABS
1.0000 mg | ORAL_TABLET | Freq: Once | ORAL | Status: AC
  Administered 2017-01-18: 1 mg via ORAL
  Filled 2017-01-18: qty 1

## 2017-01-18 NOTE — Progress Notes (Signed)
ANTICOAGULATION CONSULT NOTE - Follow up [place] for warfarin Indication: atrial fibrillation  Allergies  Allergen Reactions  . Metoprolol Tartrate Other (See Comments)    Anxiety, heart rate goes too low, feels like in a fog.    Patient Measurements: Height: 5\' 11"  (180.3 cm) Weight: 198 lb 14.4 oz (90.2 kg) IBW/kg (Calculated) : 75.3  Vital Signs: Temp: 98 F (36.7 C) (07/05 0401) Temp Source: Oral (07/05 0740) BP: 102/88 (07/05 0740) Pulse Rate: 96 (07/05 0740)  Labs:  Recent Labs  01/16/17 0253 01/17/17 0358 01/18/17 0446  HGB 9.3* 9.9* 9.8*  HCT 28.2* 29.5* 29.1*  PLT 225 226 244  LABPROT 29.7* 29.7* 32.1*  INR 2.76 2.75 3.04  CREATININE 1.59* 1.27* 1.29*  TROPONINI 0.08*  --   --     Estimated Creatinine Clearance: 43 mL/min (A) (by C-G formula based on SCr of 1.29 mg/dL (H)).  Medications:  Warfarin PTA 2mg  daily  Assessment: 77 yoM on chronic coumadin for history of afib. INR was therapeutic upon admission 2.57. Missed dose on 7/1.  INR today is technically therapeutic today at 3.04 given 10% margin of error in labs. Patient with poor-to-moderate PO intake. Elevated LFTs resolving, receiving zosyn inpatient - will watch INR and S/Sx bleeding closely.  Hgb low but stable. PLTC wnl stable.   Goal of Therapy:  INR 2-3 Monitor platelets by anticoagulation protocol: Yes   Plan:  -Warfarin 1 mg PO x 1 tonight so patient does not go supratherapeutic given decreased PO intake and DDI with zosyn -Daily INR, CBC -Monitor S/Sx bleeding, PO intake, DDI, clinical picture  Carlean Jews, Pharm.D. PGY2 Pharmacy Resident 01/18/2017 11:09 AM Main Pharmacy: [phone]

## 2017-01-18 NOTE — Progress Notes (Signed)
Patient went back into A-Fib with a HR nonsustained in the 130-140's but basically staying 110's to 120's, with B/P's 90-110/60's, text paged [facility] and awaiting call back, also let him know that the patient is concerned about not having a BM for 3 days, will address that when he calls back, no other issues at this time, will attempt to get him back to bed when able to get assistance. Patient broke right foot and has an order for NWB and foot is casted, will continue to monitor.

## 2017-01-18 NOTE — Plan of Care (Signed)
Problem: Safety: Goal: Ability to remain free from injury will improve Outcome: Progressing Pts son in room, call light in reach, frequent assessments  Problem: Physical Regulation: Goal: Will remain free from infection Outcome: Progressing Continues to received IV ABX

## 2017-01-18 NOTE — Plan of Care (Signed)
Problem: Safety: Goal: Ability to remain free from injury will improve Outcome: Progressing Patient instructed to remain in bed and to use his call light for assistance and he has been following instructions but is in a low bed and bed alarm is on during the night after his family goes home, will continue to monitor.

## 2017-01-18 NOTE — Progress Notes (Signed)
PROGRESS NOTE    [name]  CWC:[id number] DOB: 06-23-1927 DOA: 02/10/2017 PCP: Marletta Lor, MD   Brief Narrative:  83 y.o.WM PMHx Depression, Anxiety,HTN, A-Fib on Coumadin, popliteal artery embolism, MVregurg, CAD s/p stent placement,  HLD, GERD, hypothyroidism, OSA on CPAP, BPH, iron deficiency anemia, and CKD stage 3,   Who presented with confusion, cough, shortness breath, and nausea w/ some limited vomiting.  Pt was hospitalized for a Rt tibia/fib fracture 6/22-6/25. Patient had surgery, and was discharged to a rehab facility. Per his son he began to be confused and lethargic, w/ poor appetite and decreased oral intake, as well as oxygen desaturations to 60-70%.   ED w/u revealed WBC 14.9, sodium 128, AST 594, ALT 338, total bilirubin 1.8, tachycardia, and tachypnea. CXR showed interstitial opacity, worse on the left. .   Subjective: 7/5  A/O 4, negative CP, positive SOB, positive fatigue, negative N/V. Request that cast be cut off.      Assessment & Plan:   Principal Problem:   Sepsis ([place]) Active Problems:   Hypothyroidism   HLD (hyperlipidemia)   Essential hypertension   CAD (coronary artery disease)   GERD   Iron deficiency anemia   Chronic anticoagulation   Anemia   Atrial fibrillation with RVR (HCC)   Elevated troponin   Abnormal LFTs   Acute renal failure superimposed on stage 3 chronic kidney disease (HCC)   Anxiety   Acute metabolic encephalopathy   Nausea, vomiting, and diarrhea   Acute respiratory failure with hypoxia (HCC)   Hyponatremia   Sepsis unspecified organism/positive coag negative staph 1/2 bottles (most likely contaminant) -Afebrile overnight however positive leukocytosis -continue aggressive antibiotics: Unsure of site of infection but given patient's debilitated state would complete a seven-day course of antibiotics. Will narrow antibiotics i.e. DC vancomycin  Acute respiratory failure with hypoxia  -unclear  etiology,perhaps he does have a pneumonia but it is not yet visible because he is dehydrated. More likely is pulmonary edema secondary to patient's poor cardiac function.  Acute Pulmonary edema -KVO normal saline -difficult to achieve volume balance in setting of RV failure/Pulm HTN  -Patient still sounds improved but wet. Lasix 40 mg 1  Transaminitis/Shock Liver -Acute hepatitis panel negative -LE improving but still elevated -possible shock liver; no evidence of acute gallbladder disease on ultrasound  Acute metabolic encephalopathy -no acute findings on CT head   - suspect this is related to dehydration, sepsis? - check V-61 and folic acid levels   Acute renal failure on CKD Stage 3(Baseline crt ~1.2)  - suspect pre-renal azotemia - hydrate and follow  Lab Results  Component Value Date   CREATININE 1.29 (H) 01/18/2017   CREATININE 1.27 (H) 01/17/2017   CREATININE 1.59 (H) 01/16/2017  -Improving but not at baseline.  Parox A-fib(CHA2DS2 VASc score is 5  ) -on coumadin: Coumadin per pharmacy  Recent Labs Lab 01/29/2017 1756 01/15/17 0732 01/16/17 0253 01/17/17 0358 01/18/17 0446  INR 2.57 2.48 2.76 2.75 3.04  -Rate not well controlled -Will DC long-acting Cardizem and start Cardizem drip until better controlled -Per son has had multiple DCCV no longer a candidate for further conversion - s/p TEE DCCV March 2018  - is intolerant to BBs per Cardiology outpt notes  -Add Amiodarone on 7/4 if rate still not controlled. Could give one or two doses of digoxin but would avoid long-term use secondary to renal function  Chronic systolic and diastolic CHF -Baseline weight 186 pounds -See A. Fib -Strict I&O since admission+  3.4 L -Daily weight Filed Weights   01/16/17 0401 01/17/17 0543 01/18/17 0443  Weight: 196 lb 12.8 oz (89.3 kg) 195 lb (88.5 kg) 198 lb 14.4 oz (90.2 kg)    Pulmonary Hypertension -Echocardiogram from this admission when compared to TEE 09/20/16  pulmonary hypertension has significantly worsened.  HTN -Not an active problem at this time likely related to dehydration  - follow trend  CAD -Asymptomatic presently - followed by Dr. Percival Spanish in the outpatient setting  Mitral valve regurgitation  Mod via TEE March 2018  Hypothyroidism continue home Synthroid  Rt closed Tib/Fib fx -no evidence of acute complications at this time  - continue PT/OT when appropriate  HLD -Lipid panel within AHA guidelines  Dysphagia/Anorexia -7/2 MBS passed: Regular solids thin liquid -Megace 800 mg daily  OSA -CPAP per respiratory  BPH/Urinary retention -Finasteride 5 mg daily -Doxazosin 1 mg daily  Hypokalemia -Potassium goal> 4 -K-Dur 50 mEq -Repeat K/Mg @ 1500 still low: Repeat K-Dur 60 mEq -Repeat K/Mg @ 2000   Hypomagnesemia -Magnesium goal> 2 -Magnesium 2 g    DVT prophylaxis: Warfarin Code Status: DO NOT RESUSCITATE Family Communication: Son at bedside Disposition Plan: TBD   Consultants:  None    Procedures/Significant Events:  7/3 Echocardiogram:Left ventricle:mild LVH. -LVEF =55% to 60%. - Mitral valve: moderate regurgitation.- Left atrium: Severely dilated.- Right ventricle: moderately dilated.  - Tricuspid valve: moderate regurgitation. - Pulmonary arteries: PA peak pressure: 52 mm Hg (S).- Inferior vena cava: The vessel was dilated. consistent with elevated   central venous pressure. 7/2 CT head Wo contrast:Brain: No evidence of acute infarction, hemorrhage, hydrocephalus, 7/2 ultrasound abdomen:No gallstones or wall thickening visualized-Small RT pleural effusion -Large simple renal cysts  7/2 MBS passed: Regular solids thin liquid    VENTILATOR SETTINGS:    Cultures 7/1 urine negative final 7/2 blood positive coag negative staph 1/2 bottles (most likely contaminant) 7/2 MRSA by PCR negative    Antimicrobials:Anti-infectives    Start     Stop   01/16/17 2000  vancomycin (VANCOCIN)  IVPB 1000 mg/200 mL premix  Status:  Discontinued     01/15/17 0921   01/16/17 1000  vancomycin (VANCOCIN) 1,250 mg in sodium chloride 0.9 % 250 mL IVPB  Status:  Discontinued     01/18/17 1105   01/15/17 1900  piperacillin-tazobactam (ZOSYN) IVPB 3.375 g  Status:  Discontinued     01/15/17 1443   01/15/17 1500  piperacillin-tazobactam (ZOSYN) IVPB 3.375 g         01/15/17 0100  piperacillin-tazobactam (ZOSYN) IVPB 2.25 g  Status:  Discontinued     01/15/17 1441   01/28/2017 1830  piperacillin-tazobactam (ZOSYN) IVPB 3.375 g     02/05/2017 2317   02/01/2017 1830  vancomycin (VANCOCIN) IVPB 1000 mg/200 mL premix  Status:  Discontinued     02/12/2017 1827   01/22/2017 1830  vancomycin (VANCOCIN) 1,500 mg in sodium chloride 0.9 % 500 mL IVPB     01/30/2017 2359       Devices    LINES / TUBES:  None    Continuous Infusions: . sodium chloride 10 mL/hr at 01/17/17 1000  . piperacillin-tazobactam (ZOSYN)  IV 3.375 g (01/18/17 0507)  . vancomycin Stopped (01/17/17 1124)     Objective: Vitals:   01/18/17 0401 01/18/17 0443 01/18/17 0507 01/18/17 0550  BP: 98/68 117/72 111/71 127/85  Pulse: (!) 101 (!) 102  100  Resp:  10  17  Temp: 98 F (36.7 C)     TempSrc:  Oral     SpO2: 100% 95%  95%  Weight:  198 lb 14.4 oz (90.2 kg)    Height:        Intake/Output Summary (Last 24 hours) at 01/18/17 0737 Last data filed at 01/18/17 0600  Gross per 24 hour  Intake           1432.5 ml  Output             2650 ml  Net          -1217.5 ml   Filed Weights   01/16/17 0401 01/17/17 0543 01/18/17 0443  Weight: 196 lb 12.8 oz (89.3 kg) 195 lb (88.5 kg) 198 lb 14.4 oz (90.2 kg)    Examination:  General: A/O 4, positive acute respiratory distress(Improved) Eyes: negative scleral hemorrhage, negative anisocoria, negative icterus ENT: Negative Runny nose, negative gingival bleeding, Neck:  Negative scars, masses, torticollis, lymphadenopathy, JVD Lungs: bibasilar crackles, positive expiratory  wheezing  Cardiovascular: Irregular irregular rhythm and rate, without murmur gallop or rub normal S1 and S2 Abdomen: negative abdominal pain, nondistended, positive soft, bowel sounds, no rebound, no ascites, no appreciable mass Extremities: positive bilateral lower extremity edema 2+  Skin: Negative rashes, lesions, ulcers Psychiatric:  Negative depression, negative anxiety, positive fatigue, negative mania  Central nervous system:  Cranial nerves II through XII intact, tongue/uvula midline, all extremities muscle strength 5/5, sensation intact throughout,negative dysarthria, negative expressive aphasia, negative receptive aphasia.  .     Data Reviewed: Care during the described time interval was provided by me .  I have reviewed this patient's available data, including medical history, events of note, physical examination, and all test results as part of my evaluation. I have personally reviewed and interpreted all radiology studies.  CBC:  Recent Labs Lab 02/09/2017 1756 01/16/17 0253 01/17/17 0358 01/18/17 0446  WBC 14.9* 13.1* 12.5* 12.3*  NEUTROABS 12.3*  --   --   --   HGB 9.4* 9.3* 9.9* 9.8*  HCT 27.4* 28.2* 29.5* 29.1*  MCV 90.7 91.9 92.5 91.2  PLT 244 225 226 299   Basic Metabolic Panel:  Recent Labs Lab 01/15/17 0028 01/15/17 1449 01/16/17 0253 01/17/17 0358 01/18/17 0446  NA 127* 132* 133* 136 134*  K 3.8 3.8 3.6 3.7 3.1*  CL 101 101 103 105 102  CO2 17* 18* 22 23 24   GLUCOSE 126* 147* 139* 117* 103*  BUN 43* 37* 38* 27* 22*  CREATININE 2.40* 1.81* 1.59* 1.27* 1.29*  CALCIUM 7.6* 7.9* 8.0* 8.1* 7.9*  MG  --   --   --  1.9  --    GFR: Estimated Creatinine Clearance: 43 mL/min (A) (by C-G formula based on SCr of 1.29 mg/dL (H)). Liver Function Tests:  Recent Labs Lab 02/10/2017 1756 01/15/17 1449 01/16/17 0253  AST 594* 215* 147*  ALT 338* 271* 230*  ALKPHOS 136* 151* 148*  BILITOT 1.8* 1.8* 1.6*  PROT 5.6* 6.6 5.4*  ALBUMIN 2.4* 2.3* 2.1*     Recent Labs Lab 02/07/2017 2029  LIPASE 19    Recent Labs Lab 02/04/2017 1952  AMMONIA <9*   Coagulation Profile:  Recent Labs Lab 02/02/2017 1756 01/15/17 0732 01/16/17 0253 01/17/17 0358 01/18/17 0446  INR 2.57 2.48 2.76 2.75 3.04   Cardiac Enzymes:  Recent Labs Lab 01/24/2017 1756 01/30/2017 2029 01/15/17 0028 01/15/17 0732 01/16/17 0253  TROPONINI 0.04* 0.05* 0.04* 0.10* 0.08*   BNP (last 3 results) No results for input(s): PROBNP in the last 8760 hours. HbA1C: No results  for input(s): HGBA1C in the last 72 hours. CBG: No results for input(s): GLUCAP in the last 168 hours. Lipid Profile:  Recent Labs  01/17/17 0358  CHOL 86  HDL 25*  LDLCALC 52  TRIG 43  CHOLHDL 3.4   Thyroid Function Tests: No results for input(s): TSH, T4TOTAL, FREET4, T3FREE, THYROIDAB in the last 72 hours. Anemia Panel:  Recent Labs  01/16/17 0253  VITAMINB12 2,711*  FOLATE 17.8  FERRITIN 182  TIBC 223*  IRON 11*  RETICCTPCT 2.1   Urine analysis:    Component Value Date/Time   COLORURINE AMBER (A) 01/21/2017 0120   APPEARANCEUR CLOUDY (A) 01/22/2017 0120   LABSPEC 1.024 02/12/2017 0120   PHURINE 5.0 02/09/2017 0120   GLUCOSEU NEGATIVE 01/16/2017 0120   HGBUR NEGATIVE 01/27/2017 0120   BILIRUBINUR SMALL (A) 01/30/2017 0120   KETONESUR NEGATIVE 02/10/2017 0120   PROTEINUR 30 (A) 02/09/2017 0120   UROBILINOGEN 2.0 (H) 11/10/2009 0122   NITRITE NEGATIVE 02/03/2017 0120   LEUKOCYTESUR NEGATIVE 01/20/2017 0120   Sepsis Labs: @LABRCNTIP (procalcitonin:4,lacticidven:4)  ) Recent Results (from the past 240 hour(s))  Urine Culture     Status: None   Collection Time: 02/02/2017  1:25 AM  Result Value Ref Range Status   Specimen Description URINE, RANDOM  Final   Special Requests NONE  Final   Culture NO GROWTH  Final   Report Status 01/16/2017 FINAL  Final  Blood Culture (routine x 2)     Status: Abnormal (Preliminary result)   Collection Time: 01/17/2017  5:36 PM   Result Value Ref Range Status   Specimen Description BLOOD LEFT FOREARM  Final   Special Requests IN PEDIATRIC BOTTLE Blood Culture adequate volume  Final   Culture  Setup Time   Final    GRAM POSITIVE COCCI IN CLUSTERS IN PEDIATRIC BOTTLE CRITICAL RESULT CALLED TO, READ BACK BY AND VERIFIED WITH: Ophelia Shoulder, AT [phone] 01/16/17 BY D. VANHOOK    Culture STAPHYLOCOCCUS SPECIES (COAGULASE NEGATIVE) (A)  Final   Report Status PENDING  Incomplete  Blood Culture (routine x 2)     Status: None (Preliminary result)   Collection Time: 02/08/2017  5:56 PM  Result Value Ref Range Status   Specimen Description BLOOD LEFT WRIST  Final   Special Requests   Final    BOTTLES DRAWN AEROBIC AND ANAEROBIC Blood Culture adequate volume   Culture NO GROWTH 3 DAYS  Final   Report Status PENDING  Incomplete  Respiratory Panel by PCR     Status: None   Collection Time: 01/15/17 12:42 AM  Result Value Ref Range Status   Adenovirus NOT DETECTED NOT DETECTED Final   Coronavirus 229E NOT DETECTED NOT DETECTED Final   Coronavirus HKU1 NOT DETECTED NOT DETECTED Final   Coronavirus NL63 NOT DETECTED NOT DETECTED Final   Coronavirus OC43 NOT DETECTED NOT DETECTED Final   Metapneumovirus NOT DETECTED NOT DETECTED Final   Rhinovirus / Enterovirus NOT DETECTED NOT DETECTED Final   Influenza A NOT DETECTED NOT DETECTED Final   Influenza B NOT DETECTED NOT DETECTED Final   Parainfluenza Virus 1 NOT DETECTED NOT DETECTED Final   Parainfluenza Virus 2 NOT DETECTED NOT DETECTED Final   Parainfluenza Virus 3 NOT DETECTED NOT DETECTED Final   Parainfluenza Virus 4 NOT DETECTED NOT DETECTED Final   Respiratory Syncytial Virus NOT DETECTED NOT DETECTED Final   Bordetella pertussis NOT DETECTED NOT DETECTED Final   Chlamydophila pneumoniae NOT DETECTED NOT DETECTED Final   Mycoplasma pneumoniae NOT DETECTED NOT  DETECTED Final  MRSA PCR Screening     Status: None   Collection Time: 01/15/17 12:44 AM  Result Value Ref  Range Status   MRSA by PCR NEGATIVE NEGATIVE Final    Comment:        The GeneXpert MRSA Assay (FDA approved for NASAL specimens only), is one component of a comprehensive MRSA colonization surveillance program. It is not intended to diagnose MRSA infection nor to guide or monitor treatment for MRSA infections.          Radiology Studies: No results found.      Scheduled Meds: . clonazePAM  0.5 mg Oral QHS  . diltiazem  60 mg Oral Q6H  . doxazosin  1 mg Oral Daily  . finasteride  5 mg Oral Daily  . levothyroxine  50 mcg Oral Once per day on Sun Mon Wed Fri  . levothyroxine  75 mcg Oral Once per day on Tue Thu Sat  . megestrol  800 mg Oral Daily  . multivitamin with minerals  1 tablet Oral Daily  . omega-3 acid ethyl esters  1 g Oral Daily  . pantoprazole  40 mg Oral BID  . pravastatin  40 mg Oral QPM  . saccharomyces boulardii  250 mg Oral BID  . Warfarin - Pharmacist Dosing Inpatient   Does not apply q1800   Continuous Infusions: . sodium chloride 10 mL/hr at 01/17/17 1000  . piperacillin-tazobactam (ZOSYN)  IV 3.375 g (01/18/17 0507)  . vancomycin Stopped (01/17/17 1124)     LOS: 4 days    Time spent: 40 minutes    Sybilla Malhotra, Geraldo Docker, MD [facility] Pager [phone]   If 7PM-7AM, please contact night-coverage [email or web address] Password [facility] 01/18/2017, 7:37 AM

## 2017-01-18 NOTE — Plan of Care (Signed)
Problem: Pain Managment: Goal: General experience of comfort will improve Outcome: Progressing Patient denies pain at this time, will continue to monitor.   

## 2017-01-18 NOTE — Progress Notes (Signed)
MD called back and given information about his history and his problem with A-Fib and getting his HR controlled without dropping his B/P, will give a 250cc NS bolus and continue to monitor. MD also ordered Dulcolax Supp., will give when patient is back to bed, no other changes noted at this time, will continue to monitor.

## 2017-01-19 ENCOUNTER — Inpatient Hospital Stay (HOSPITAL_COMMUNITY): Payer: Medicare Other

## 2017-01-19 DIAGNOSIS — I5033 Acute on chronic diastolic (congestive) heart failure: Secondary | ICD-10-CM

## 2017-01-19 DIAGNOSIS — I50811 Acute right heart failure: Secondary | ICD-10-CM

## 2017-01-19 DIAGNOSIS — R0602 Shortness of breath: Secondary | ICD-10-CM

## 2017-01-19 LAB — CBC
HCT: 32.2 % — ABNORMAL LOW (ref 39.0–52.0)
Hemoglobin: 10.7 g/dL — ABNORMAL LOW (ref 13.0–17.0)
MCH: 30.7 pg (ref 26.0–34.0)
MCHC: 33.2 g/dL (ref 30.0–36.0)
MCV: 92.5 fL (ref 78.0–100.0)
PLATELETS: 336 10*3/uL (ref 150–400)
RBC: 3.48 MIL/uL — ABNORMAL LOW (ref 4.22–5.81)
RDW: 14.7 % (ref 11.5–15.5)
WBC: 12.9 10*3/uL — AB (ref 4.0–10.5)

## 2017-01-19 LAB — BASIC METABOLIC PANEL
Anion gap: 10 (ref 5–15)
BUN: 17 mg/dL (ref 6–20)
CO2: 24 mmol/L (ref 22–32)
CREATININE: 1.24 mg/dL (ref 0.61–1.24)
Calcium: 8.3 mg/dL — ABNORMAL LOW (ref 8.9–10.3)
Chloride: 103 mmol/L (ref 101–111)
GFR calc Af Amer: 58 mL/min — ABNORMAL LOW (ref >60)
GFR, EST NON AFRICAN AMERICAN: 50 mL/min — AB (ref >60)
Glucose, Bld: 117 mg/dL — ABNORMAL HIGH (ref 65–99)
Potassium: 3.7 mmol/L (ref 3.5–5.1)
SODIUM: 137 mmol/L (ref 135–145)

## 2017-01-19 LAB — CULTURE, BLOOD (ROUTINE X 2)
CULTURE: NO GROWTH
SPECIAL REQUESTS: ADEQUATE

## 2017-01-19 LAB — PROTIME-INR
INR: 3.08
Prothrombin Time: 32.5 seconds — ABNORMAL HIGH (ref 11.4–15.2)

## 2017-01-19 LAB — MAGNESIUM: Magnesium: 2.2 mg/dL (ref 1.7–2.4)

## 2017-01-19 MED ORDER — ACETAMINOPHEN 325 MG PO TABS
650.0000 mg | ORAL_TABLET | Freq: Four times a day (QID) | ORAL | Status: DC | PRN
  Administered 2017-01-20 – 2017-01-23 (×6): 650 mg via ORAL
  Filled 2017-01-19 (×7): qty 2

## 2017-01-19 MED ORDER — FUROSEMIDE 10 MG/ML IJ SOLN
40.0000 mg | Freq: Once | INTRAMUSCULAR | Status: AC
  Administered 2017-01-19: 40 mg via INTRAVENOUS
  Filled 2017-01-19: qty 4

## 2017-01-19 MED ORDER — CLONAZEPAM 0.5 MG PO TABS
1.0000 mg | ORAL_TABLET | Freq: Every day | ORAL | Status: DC
  Administered 2017-01-19 – 2017-01-24 (×6): 1 mg via ORAL
  Filled 2017-01-19 (×6): qty 2

## 2017-01-19 MED ORDER — FUROSEMIDE 10 MG/ML IJ SOLN
20.0000 mg | Freq: Once | INTRAMUSCULAR | Status: AC
  Administered 2017-01-19: 20 mg via INTRAVENOUS
  Filled 2017-01-19: qty 2

## 2017-01-19 MED ORDER — TRAZODONE HCL 50 MG PO TABS
25.0000 mg | ORAL_TABLET | Freq: Every day | ORAL | Status: DC
  Administered 2017-01-19 – 2017-01-24 (×6): 25 mg via ORAL
  Filled 2017-01-19 (×6): qty 1

## 2017-01-19 MED ORDER — LEVOTHYROXINE SODIUM 50 MCG PO TABS
50.0000 ug | ORAL_TABLET | Freq: Every day | ORAL | Status: DC
  Administered 2017-01-20 – 2017-01-24 (×5): 50 ug via ORAL
  Filled 2017-01-19 (×5): qty 1

## 2017-01-19 MED ORDER — POTASSIUM CHLORIDE CRYS ER 20 MEQ PO TBCR
20.0000 meq | EXTENDED_RELEASE_TABLET | Freq: Three times a day (TID) | ORAL | Status: DC
  Administered 2017-01-19 – 2017-01-22 (×8): 20 meq via ORAL
  Filled 2017-01-19 (×8): qty 1

## 2017-01-19 MED ORDER — FUROSEMIDE 10 MG/ML IJ SOLN
40.0000 mg | Freq: Two times a day (BID) | INTRAMUSCULAR | Status: DC
  Administered 2017-01-19 – 2017-01-22 (×6): 40 mg via INTRAVENOUS
  Filled 2017-01-19 (×6): qty 4

## 2017-01-19 MED ORDER — DILTIAZEM HCL 100 MG IV SOLR
12.5000 mg/h | INTRAVENOUS | Status: DC
  Administered 2017-01-19: 5 mg/h via INTRAVENOUS
  Administered 2017-01-21 – 2017-01-22 (×5): 12.5 mg/h via INTRAVENOUS
  Filled 2017-01-19 (×10): qty 100

## 2017-01-19 MED ORDER — DILTIAZEM LOAD VIA INFUSION
20.0000 mg | Freq: Once | INTRAVENOUS | Status: AC
  Administered 2017-01-19: 20 mg via INTRAVENOUS
  Filled 2017-01-19: qty 20

## 2017-01-19 MED ORDER — WARFARIN SODIUM 1 MG PO TABS
1.0000 mg | ORAL_TABLET | Freq: Once | ORAL | Status: AC
  Administered 2017-01-19: 1 mg via ORAL
  Filled 2017-01-19: qty 1

## 2017-01-19 MED ORDER — POTASSIUM CHLORIDE CRYS ER 20 MEQ PO TBCR
20.0000 meq | EXTENDED_RELEASE_TABLET | Freq: Once | ORAL | Status: AC
  Administered 2017-01-19: 20 meq via ORAL
  Filled 2017-01-19: qty 1

## 2017-01-19 NOTE — Progress Notes (Addendum)
Increased WOB. Crackles auscultated in left lung base. On call provider notified. New orders received. Will continue to monitor closely.

## 2017-01-19 NOTE — Progress Notes (Signed)
ANTICOAGULATION CONSULT NOTE - Follow up [facility] for warfarin Indication: atrial fibrillation  Allergies  Allergen Reactions  . Metoprolol Tartrate Other (See Comments)    Anxiety, heart rate goes too low, feels like in a fog.    Patient Measurements: Height: 5\' 11"  (180.3 cm) Weight: 200 lb (90.7 kg) IBW/kg (Calculated) : 75.3  Vital Signs: Temp: 98.7 F (37.1 C) (07/06 0806) Temp Source: Oral (07/06 0806) BP: 152/70 (07/06 0806) Pulse Rate: 58 (07/06 0806)  Labs:  Recent Labs  01/17/17 0358 01/18/17 0446 01/19/17 0356  HGB 9.9* 9.8* 10.7*  HCT 29.5* 29.1* 32.2*  PLT 226 244 336  LABPROT 29.7* 32.1* 32.5*  INR 2.75 3.04 3.08  CREATININE 1.27* 1.29* 1.24    Estimated Creatinine Clearance: 48.4 mL/min (by C-G formula based on SCr of 1.24 mg/dL).  Medications:  Warfarin PTA 2mg  daily  Assessment: 40 yoM on chronic coumadin for history of afib. INR was therapeutic upon admission 2.57. Missed dose on 7/1.  INR today is technically therapeutic today at 3.08 given 10% margin of error in labs. Patient with poor-to-moderate PO intake. Elevated LFTs resolving, receiving zosyn inpatient - will watch INR and S/Sx bleeding closely.  Hgb low but stable. PLTC wnl stable.   Goal of Therapy:  INR 2-3 Monitor platelets by anticoagulation protocol: Yes   Plan:  -Warfarin 1 mg PO x 1 tonight so patient does not go supratherapeutic given decreased PO intake and DDI with zosyn -Daily INR, CBC -Monitor S/Sx bleeding, PO intake, DDI, clinical picture  Carlean Jews, Pharm.D. PGY2 Pharmacy Resident 01/19/2017 10:51 AM Main Pharmacy: [phone]

## 2017-01-19 NOTE — Progress Notes (Signed)
Pharmacy Antibiotic Note  [name] is a 83 y.o. male admitted on 01/27/2017 with sepsis 2/2 HCAP vs UTI.  Pharmacy has been consulted for zosyn dosing. Per MD note, planning for total of 7-days of ABX given pt's debilitated state.   Today is D#6 ABX on zosyn alone at this time for sepsis of unknown source: HCAP vs UTI. Patient afebrile , wbc with little change ~13. Renal function improved, LA <2.   Plan: Zosyn 3.375gm IV q8h F/u renal fxn, C&S, clinical status   Height: 5\' 11"  (180.3 cm) Weight: 200 lb (90.7 kg) IBW/kg (Calculated) : 75.3  Temp (24hrs), Avg:98.2 F (36.8 C), Min:97.8 F (36.6 C), Max:98.7 F (37.1 C)   Recent Labs Lab 02/04/2017 1756 01/23/2017 1804 02/04/2017 1957 01/23/2017 2354  01/15/17 1449 01/16/17 0253 01/17/17 0358 01/18/17 0446 01/19/17 0356  WBC 14.9*  --   --   --   --   --  13.1* 12.5* 12.3* 12.9*  CREATININE 2.90*  --   --   --   < > 1.81* 1.59* 1.27* 1.29* 1.24  LATICACIDVEN  --  1.69 1.6 1.9  --   --   --  1.1  --   --   < > = values in this interval not displayed.  Estimated Creatinine Clearance: 48.4 mL/min (by C-G formula based on SCr of 1.24 mg/dL).    Allergies  Allergen Reactions  . Metoprolol Tartrate Other (See Comments)    Anxiety, heart rate goes too low, feels like in a fog.    Antimicrobials this admission: Vanc 7/1>> 7/5 Zosyn 7/1>>   Dose adjustments this admission: N/A  Microbiology results: 7/2 Legionella UAg >> negative 7/2 strep pneumo UAg >> negative 7/2 Hepatitis>> negative 7/2 Resp panel>> negative 7/2 MRSA PCR >> negative 7/1 BCxr >> GPC in 1/2 bld cxs 7/1 UCr > negative  Thank you for allowing pharmacy to be a part of this patient's care.  Carlean Jews, Pharm.D. PGY2 Pharmacy Resident 01/19/2017 10:54 AM Main Pharmacy: [phone]

## 2017-01-19 NOTE — Progress Notes (Signed)
This note also relates to the following rows which could not be included: Pulse Rate - Cannot attach notes to unvalidated device data Resp - Cannot attach notes to unvalidated device data SpO2 - Cannot attach notes to unvalidated device data   Patient placed on cpap due to saturation dropping when napping. Patient is stable and comfortable, RT will continue to monitor.

## 2017-01-19 NOTE — Progress Notes (Signed)
K results 2.9. On call provider notified. New orders given. Will continue to monitor.

## 2017-01-19 NOTE — Progress Notes (Signed)
Pt family member called nurse to room stating "he cant breath", low 02 sat noted, pt drowsy, mouth breathing assisted pt semi fowlers position with pillows under arms, HPNC adjusted to 10 liters encouraged to breath through nose , Dr Dorothy Puffer notified and assessed pt in room, RT notified,  little improvement in 02 sats, pt continuing to mouth breath  cpap applied with supplimental 02 in line with improvement  high 80s to low 90s, able to sleep without distress.  Edward Qualia RN

## 2017-01-19 NOTE — Progress Notes (Signed)
[facility] TEAM 1 - Stepdown/ICU TEAM  ALMA MUEGGE  ZOX:[id number] DOB: [birth year] DOA: 01/20/2017 PCP: Marletta Lor, MD    Brief Narrative:  83 y.o. male with history of Hypertension, hyperlipidemia, GERD, hypothyroidism, depression, anxiety, OSA on CPAP, A. fib on Coumadin, popliteal artery embolism, MV regurg, CAD s/p stent placement, BPH, iron deficiency anemia, and CKD3 who presented with confusion, cough, shortness breath, and nausea w/ some limited vomiting.  Pt was hospitalized for a R tibia/fib fracture 6/22-6/25. Patient had surgery, and was discharged to a rehab facility. Per his son he began to be confused and lethargic, w/ poor appetite and decreased oral intake, as well as oxygen desaturations to 60-70%.   ED w/u revealed WBC 14.9, sodium 128, AST 594, ALT 338, total bilirubin 1.8, tachycardia, and tachypnea. CXR showed interstitial opacity, worse on the left. .  Subjective: The patient is suffering with an episode of acute respiratory distress at the time of my visit.  Family reports he was doing well until about an hour ago when he began to experience dyspnea.  This was while laying quietly in bed.  Changing positions has not assisted in improving his symptoms.  He feels quite anxious during these episodes as well.  He denies substernal chest pressure abdominal pain nausea or vomiting.  Assessment & Plan:  Acute hypoxic respiratory failure - pulmonary edema  difficult to achieve volume balance in setting of RV failure/Pulm HTN - diurese further today - Cards consulted - suspect MVR + RHF + Afib w/ RVR all contributing to these recurring episodes of flash pulmonary edema - may not be much beyond diuresis to afford him   Parox Afib on coumadin CHA2DS2 VASc score is 5 - s/p TEE DCCV March 2018 - is intolerant to BBs per Cardiology outpt notes - evaluated by EP May 2018 at which time amio was noted to not be working for his afib - rate has proven difficult to control, and RVR  appears to be playing a role in exacerbating his pulmonary edema - Cards consulted    R heart failure - Mitral valve regurg - Tricuspid valve regurg TTE notes severe LAE, mode RAE, moderate RVE, and mild RV systolic dysfxn w/ moderate MR and TR - RVSP noted to be 52 - volume proving difficult to balance w/ pt swinging quickly from [facility] to volume overload - begin scheduled diuretic w/ understanding that renal fx may suffer  Filed Weights   01/17/17 0543 01/18/17 0443 01/19/17 0445  Weight: 88.5 kg (195 lb) 90.2 kg (198 lb 14.4 oz) 90.7 kg (200 lb)    SIRS  I suspect his blood cx is a contaminant, and I doubt he has actually had an acute infection - stop abx and follow   Transaminitis possible shock liver - acute viral hepatitis panel negative - no evidence of acute gallbladder disease on ultrasound - steadily improving w/ volume expansion - follow w/ diuresis - may also be a component of congestive hepatopathy   Acute metabolic encephalopathy no acute findings on CT head  - suspect this was related to dehydration and hyponatremia  - W-11 and folic acid levels normal - perhaps related to trazodone and hydrocodone administered in the skilled nursing facility as well - now at baseline mental status   Hyponatremia Sodium has normalized  Recent Labs Lab 01/15/17 1449 01/16/17 0253 01/17/17 0358 01/18/17 0446 01/19/17 0356  NA 132* 133* 136 134* 137   Acute renal injury on CKD Stage 3 Baseline crt ~1.2 -  suspect pre-renal azotemia - crt normalizing w/ hydration but now must diuresis - follow trend w/ scheduled lasix   Recent Labs Lab 01/15/17 1449 01/16/17 0253 01/17/17 0358 01/18/17 0446 01/19/17 0356  CREATININE 1.81* 1.59* 1.27* 1.29* 1.24    Hypothyroidism continue home Synthroid but in setting of persistent tachycardia will decrease dose/aim for less aggressive TSH goal   R closed Tib/Fib fx no evidence of acute complications at this time - continue PT/OT - due for Ortho  f/u 7/10 - will ask Dr. Rush Farmer to see pt while he is in the hospital (non-urgent)  HLD  HTN Not an active problem at this time d  CAD Asymptomatic presently - followed by Dr. Percival Spanish in the outpatient setting  GERD  OSA Home CPAP   DVT prophylaxis: warfarin Code Status: DNR - NO CODE Family Communication: Spoke with son at bedside at length Disposition Plan: SDU  Consultants:  none  Procedures: none  Antimicrobials:  Zosyn 7/1 > 7/6 Vancomycin 7/1 > 7/6  Objective: Blood pressure 96/82, pulse (!) 115, temperature 98.7 F (37.1 C), temperature source Oral, resp. rate (!) 23, height 5\' 11"  (1.803 m), weight 90.7 kg (200 lb), SpO2 (!) 82 %.  Intake/Output Summary (Last 24 hours) at 01/19/17 1410 Last data filed at 01/19/17 1248  Gross per 24 hour  Intake              690 ml  Output             3550 ml  Net            -2860 ml   Filed Weights   01/17/17 0543 01/18/17 0443 01/19/17 0445  Weight: 88.5 kg (195 lb) 90.2 kg (198 lb 14.4 oz) 90.7 kg (200 lb)    Examination: General: Alert and conversant  - Acute respiratory distress with anxiety Lungs: Diffuse crackles - no wheezing - tachypneic Cardiovascular: irregularly irregular - rate 120+ Abdomen: Nontender, nondistended, soft, bowel sounds positive, no rebound, no ascites, no appreciable mass Extremities: Right lower extremity in cast - left lower extremity with 1+ edema w/o change   CBC:  Recent Labs Lab 01/17/2017 1756 01/16/17 0253 01/17/17 0358 01/18/17 0446 01/19/17 0356  WBC 14.9* 13.1* 12.5* 12.3* 12.9*  NEUTROABS 12.3*  --   --   --   --   HGB 9.4* 9.3* 9.9* 9.8* 10.7*  HCT 27.4* 28.2* 29.5* 29.1* 32.2*  MCV 90.7 91.9 92.5 91.2 92.5  PLT 244 225 226 244 782   Basic Metabolic Panel:  Recent Labs Lab 01/15/17 1449 01/16/17 0253 01/17/17 0358 01/18/17 0446 01/18/17 1415 01/18/17 1853 01/19/17 0356  NA 132* 133* 136 134*  --   --  137  K 3.8 3.6 3.7 3.1* 3.0* 2.9* 3.7  CL 101 103 105  102  --   --  103  CO2 18* 22 23 24   --   --  24  GLUCOSE 147* 139* 117* 103*  --   --  117*  BUN 37* 38* 27* 22*  --   --  17  CREATININE 1.81* 1.59* 1.27* 1.29*  --   --  1.24  CALCIUM 7.9* 8.0* 8.1* 7.9*  --   --  8.3*  MG  --   --  1.9  --  1.7 2.2 2.2   GFR: Estimated Creatinine Clearance: 48.4 mL/min (by C-G formula based on SCr of 1.24 mg/dL).  Liver Function Tests:  Recent Labs Lab 02/10/2017 1756 01/15/17 1449 01/16/17 0253  AST 594*  215* 147*  ALT 338* 271* 230*  ALKPHOS 136* 151* 148*  BILITOT 1.8* 1.8* 1.6*  PROT 5.6* 6.6 5.4*  ALBUMIN 2.4* 2.3* 2.1*    Recent Labs Lab 01/24/2017 2029  LIPASE 19    Recent Labs Lab 02/02/2017 1952  AMMONIA <9*    Coagulation Profile:  Recent Labs Lab 01/15/17 0732 01/16/17 0253 01/17/17 0358 01/18/17 0446 01/19/17 0356  INR 2.48 2.76 2.75 3.04 3.08    Cardiac Enzymes:  Recent Labs Lab 01/19/2017 1756 02/04/2017 2029 01/15/17 0028 01/15/17 0732 01/16/17 0253  TROPONINI 0.04* 0.05* 0.04* 0.10* 0.08*    Recent Results (from the past 240 hour(s))  Urine Culture     Status: None   Collection Time: 01/20/2017  1:25 AM  Result Value Ref Range Status   Specimen Description URINE, RANDOM  Final   Special Requests NONE  Final   Culture NO GROWTH  Final   Report Status 01/16/2017 FINAL  Final  Blood Culture (routine x 2)     Status: Abnormal   Collection Time: 01/29/2017  5:36 PM  Result Value Ref Range Status   Specimen Description BLOOD LEFT FOREARM  Final   Special Requests IN PEDIATRIC BOTTLE Blood Culture adequate volume  Final   Culture  Setup Time   Final    GRAM POSITIVE COCCI IN CLUSTERS IN PEDIATRIC BOTTLE CRITICAL RESULT CALLED TO, READ BACK BY AND VERIFIED WITH: F. [place], AT [phone] 01/16/17 BY D. VANHOOK    Culture (A)  Final    STAPHYLOCOCCUS SPECIES (COAGULASE NEGATIVE) THE SIGNIFICANCE OF ISOLATING THIS ORGANISM FROM A SINGLE SET OF BLOOD CULTURES WHEN MULTIPLE SETS ARE DRAWN IS UNCERTAIN. PLEASE  NOTIFY THE MICROBIOLOGY DEPARTMENT WITHIN ONE WEEK IF SPECIATION AND SENSITIVITIES ARE REQUIRED.    Report Status 01/18/2017 FINAL  Final  Blood Culture (routine x 2)     Status: None   Collection Time: 01/20/2017  5:56 PM  Result Value Ref Range Status   Specimen Description BLOOD LEFT WRIST  Final   Special Requests   Final    BOTTLES DRAWN AEROBIC AND ANAEROBIC Blood Culture adequate volume   Culture NO GROWTH 5 DAYS  Final   Report Status 01/19/2017 FINAL  Final  Respiratory Panel by PCR     Status: None   Collection Time: 01/15/17 12:42 AM  Result Value Ref Range Status   Adenovirus NOT DETECTED NOT DETECTED Final   Coronavirus 229E NOT DETECTED NOT DETECTED Final   Coronavirus HKU1 NOT DETECTED NOT DETECTED Final   Coronavirus NL63 NOT DETECTED NOT DETECTED Final   Coronavirus OC43 NOT DETECTED NOT DETECTED Final   Metapneumovirus NOT DETECTED NOT DETECTED Final   Rhinovirus / Enterovirus NOT DETECTED NOT DETECTED Final   Influenza A NOT DETECTED NOT DETECTED Final   Influenza B NOT DETECTED NOT DETECTED Final   Parainfluenza Virus 1 NOT DETECTED NOT DETECTED Final   Parainfluenza Virus 2 NOT DETECTED NOT DETECTED Final   Parainfluenza Virus 3 NOT DETECTED NOT DETECTED Final   Parainfluenza Virus 4 NOT DETECTED NOT DETECTED Final   Respiratory Syncytial Virus NOT DETECTED NOT DETECTED Final   Bordetella pertussis NOT DETECTED NOT DETECTED Final   Chlamydophila pneumoniae NOT DETECTED NOT DETECTED Final   Mycoplasma pneumoniae NOT DETECTED NOT DETECTED Final  MRSA PCR Screening     Status: None   Collection Time: 01/15/17 12:44 AM  Result Value Ref Range Status   MRSA by PCR NEGATIVE NEGATIVE Final    Comment:  The GeneXpert MRSA Assay (FDA approved for NASAL specimens only), is one component of a comprehensive MRSA colonization surveillance program. It is not intended to diagnose MRSA infection nor to guide or monitor treatment for MRSA infections.       Scheduled Meds: . clonazePAM  1 mg Oral QHS  . diltiazem  60 mg Oral Q6H  . doxazosin  1 mg Oral Daily  . finasteride  5 mg Oral Daily  . levothyroxine  50 mcg Oral Once per day on Sun Mon Wed Fri  . levothyroxine  75 mcg Oral Once per day on Tue Thu Sat  . megestrol  800 mg Oral Daily  . multivitamin with minerals  1 tablet Oral Daily  . omega-3 acid ethyl esters  1 g Oral Daily  . pantoprazole  40 mg Oral BID  . pravastatin  40 mg Oral QPM  . saccharomyces boulardii  250 mg Oral BID  . warfarin  1 mg Oral ONCE-1800  . Warfarin - Pharmacist Dosing Inpatient   Does not apply q1800     LOS: 5 days   Cherene Altes, MD [facility] Office  [phone] Pager - Text Page per Amion as per below:  On-Call/Text Page:      [email or web address]      password [email or web address]  If 7PM-7AM, please contact night-coverage [email or web address] Password [email or web address] 01/19/2017, 2:10 PM

## 2017-01-19 NOTE — Progress Notes (Signed)
CSW has continued to follow, as SNF was recommended. [facility] and [facility] followed up. [facility] still [facility]. If family wishes, a new referral can be made next week. Family prefers home health at time of discharge if no [facility] bed. Patient was from [place] and family adamant patient does not return there. CSW will continue to follow if other disposition needs arise. RNCM aware family's choice is for home health.  [name], [place]

## 2017-01-19 NOTE — Consult Note (Signed)
Cardiology Consultation:   Patient ID: [name]; [id number]; October 08, 1926   Admit date: 01/23/2017 Date of Consult: 01/19/2017  Primary Care Provider: Marletta Lor, MD Primary Cardiologist: Dr. Percival Spanish Primary Electrophysiologist:  Dr. Curt Bears   Patient Profile:   [name] is a 83 y.o. male with a hx of persistant atrial fibrillation with 7 cardioversions since 03/2014 and warfarin therapy, moderate MR, CAD status post stent placement, hyperlipidemia, GERD, hypothyroidism, OSA on CPAP, BPH, iron deficiency anemia, CK D stage III, depression and anxiety  who is being seen today for the evaluation of atrial fibrillation with rapid ventricular response at the request of Dr Thereasa Solo.  History of Present Illness:   [name] was admitted on 01/17/2017 from [place] [facility] after his family noted him to be having respiratory difficulty, lethargy and altered mental status. Upon admission patient was noted to be dehydrated with renal insufficiency. Labs revealed WBC 14.9, sodium 128, elevated LFTs. Chest x-ray showed interstitial opacity, worse on the left. The patient was started on empiric antibiotics and IV fluids. Patient was tachycardic in atrial fibrillation.   The patient had a recent fall on 01/05/2017 with a tib-fib fracture and surgical repair. He was discharged to [facility] place and have been participating in physical therapy.   He has a history of persistent atrial fibrillation and had been treated for a long time with amiodarone. He maintained sinus rhythm for many years with occasional atrial fib in the setting of stressors and has been cardioverted many times. It is only in the last year that he has had trouble maintaining sinus rhythm. He is followed by Dr. Curt Bears, electrophysiologist, who has explored various options for atrial fibrillation control. The patient has had 7 cardioversions since September 2015, with the last being in March 2018. He was most recently seen by Dr.  Curt Bears on 11/15/2016 at which time the patient had persistent symptomatic atrial fib. He has had difficulty tolerating beta blockers in the past due to drops in heart rate and blood pressure and complaints of low energy, anxiety and fog. On 5/2 his amiodarone was stopped and diltiazem initiated for a rate control strategy.  He was seen for routine cardiology follow-up by Dr. Percival Spanish on 12/28/16 and was doing well on the Cardizem with heart rates typically in the 90s and well-controlled blood pressure, with no presyncope or syncope.  His CHA2DS2-VASc Score and unadjusted ischemic stroke rate (% per year) is equal to 4.8% stroke rate per year from a score of 4 (vascular disease, HTN, age (2)). He is anticoagulated with Coumadin for stroke risk reduction    Past Medical History:  Diagnosis Date  . Anemia   . Barrett's esophagus   . BENIGN PROSTATIC HYPERTROPHY   . Chronic anticoagulation    a. on Coumadin  . CORONARY ARTERY DISEASE    a. s/p stent OM 2002. b. s/p BMS 2006 RCA  c. s/p cath 2007.  [name] DEPRESSION   . DIVERTICULOSIS, COLON   . GERD   . GYNECOMASTIA, UNILATERAL   . Hematoma of abdominal wall   . History of echocardiogram    a. EF 55% (TEE 06/2014)  . HYPERLIPIDEMIA   . HYPERTENSION   . HYPOTHYROIDISM   . Mallory - Weiss tear    a. > 5 years ago  . Mitral regurgitation    a. Mild-mod by TEE 06/2014.  [name] Paroxysmal A-fib (HCC)    a. chronic amiodarone and coumadin;  b. 05/2013 s/p DCCV. c. 06/2014 s/p TEE/DCCV.  [name]  Popliteal artery embolism, right ([place])   . SLEEP APNEA    CPAP    Past Surgical History:  Procedure Laterality Date  . cardioversion  8/08  . CARDIOVERSION  09/14/2011   Procedure: CARDIOVERSION;  Surgeon: Josue Hector, MD;  Location: [place];  Service: Cardiovascular;  Laterality: N/A;  . CARDIOVERSION N/A 03/23/2014   Procedure: CARDIOVERSION;  Surgeon: Thompson Grayer, MD;  Location: [place];  Service: Cardiovascular;  Laterality: N/A;  . CARDIOVERSION N/A  06/22/2014   Procedure: CARDIOVERSION;  Surgeon: [name], MD;  Location: [facility] ENDOSCOPY;  Service: Cardiovascular;  Laterality: N/A;  . CARDIOVERSION N/A 04/27/2015   Procedure: CARDIOVERSION;  Surgeon: Josue Hector, MD;  Location: [facility] ENDOSCOPY;  Service: Cardiovascular;  Laterality: N/A;  . CARDIOVERSION N/A 07/16/2015   Procedure: CARDIOVERSION;  Surgeon: [name], MD;  Location: [place];  Service: Cardiovascular;  Laterality: N/A;  . CARDIOVERSION N/A 11/26/2015   Procedure: CARDIOVERSION;  Surgeon: Sanda Klein, MD;  Location[facility] ENDOSCOPY;  Service: Cardiovascular;  Laterality: N/A;  . CARDIOVERSION N/A 05/17/2016   Procedure: CARDIOVERSION;  Surgeon: Thayer Headings, MD;  Location: [place];  Service: Cardiovascular;  Laterality: N/A;  . CARDIOVERSION N/A 09/20/2016   Procedure: CARDIOVERSION;  Surgeon: [name], MD;  Location: [facility] ENDOSCOPY;  Service: Cardiovascular;  Laterality: N/A;  . CARDIOVERSION N/A 11/09/2016   Procedure: CARDIOVERSION;  Surgeon: [name], MD;  Location: [facility] ENDOSCOPY;  Service: Cardiovascular;  Laterality: N/A;  . CORONARY ANGIOPLASTY WITH STENT PLACEMENT    . ESOPHAGOGASTRODUODENOSCOPY    . ORIF ANKLE FRACTURE Right 01/06/2017   Procedure: OPEN REDUCTION INTERNAL FIXATION (ORIF) ANKLE FRACTURE, RIGHT;  Surgeon: Mcarthur Rossetti, MD;  Location: [place];  Service: Orthopedics;  Laterality: Right;  . TEE WITH CARDIOVERSION  7/08  . TEE WITHOUT CARDIOVERSION  09/14/2011   Procedure: TRANSESOPHAGEAL ECHOCARDIOGRAM (TEE);  Surgeon: Josue Hector, MD;  Location: [facility] ENDOSCOPY;  Service: Cardiovascular;  Laterality: N/A;  . TEE WITHOUT CARDIOVERSION N/A 06/22/2014   Procedure: TRANSESOPHAGEAL ECHOCARDIOGRAM (TEE);  Surgeon: [name], MD;  Location: [facility] ENDOSCOPY;  Service: Cardiovascular;  Laterality: N/A;  . TEE WITHOUT CARDIOVERSION N/A 05/17/2016   Procedure: TRANSESOPHAGEAL ECHOCARDIOGRAM (TEE);  Surgeon: Thayer Headings, MD;  Location: [place];  Service: Cardiovascular;  Laterality: N/A;  . TEE WITHOUT CARDIOVERSION N/A 09/20/2016   Procedure: TRANSESOPHAGEAL ECHOCARDIOGRAM (TEE);  Surgeon: [name], MD;  Location: [place];  Service: Cardiovascular;  Laterality: N/A;  . TRANSURETHRAL RESECTION OF PROSTATE       Inpatient Medications: Scheduled Meds: . clonazePAM  1 mg Oral QHS  . diltiazem  60 mg Oral Q6H  . doxazosin  1 mg Oral Daily  . finasteride  5 mg Oral Daily  . levothyroxine  50 mcg Oral Once per day on Sun Mon Wed Fri  . levothyroxine  75 mcg Oral Once per day on Tue Thu Sat  . megestrol  800 mg Oral Daily  . multivitamin with minerals  1 tablet Oral Daily  . omega-3 acid ethyl esters  1 g Oral Daily  . pantoprazole  40 mg Oral BID  . pravastatin  40 mg Oral QPM  . saccharomyces boulardii  250 mg Oral BID  . warfarin  1 mg Oral ONCE-1800  . Warfarin - Pharmacist Dosing Inpatient   Does not apply q1800   Continuous Infusions: . sodium chloride 10 mL/hr at 01/17/17 1000  . piperacillin-tazobactam (ZOSYN)  IV Stopped (01/19/17 1011)   PRN Meds:  dextromethorphan-guaiFENesin, hydroxypropyl methylcellulose / hypromellose, ibuprofen, levalbuterol, morphine injection, nitroGLYCERIN, oxyCODONE  Allergies:    Allergies  Allergen Reactions  . Metoprolol Tartrate Other (See Comments)    Anxiety, heart rate goes too low, feels like in a fog.    Social History:   Social History   Social History  . Marital status: Married    Spouse name: N/A  . Number of children: 1  . Years of education: N/A   Occupational History  . Retired    Social History Main Topics  . Smoking status: Never Smoker  . Smokeless tobacco: Never Used  . Alcohol use No  . Drug use: No  . Sexual activity: Not on file   Other Topics Concern  . Not on file   Social History Narrative   He is retired from Landscape [employer].   Mother lived into her 44's.  Father died in his 7's.  No CAD.      Family History:   The patient's family history includes Heart attack in his mother; Heart disease in his brother; Hypertension in his other. There is no history of Stroke.  ROS:  Please see the history of present illness.  ROS  All other ROS reviewed and negative.     Physical Exam/Data:   Vitals:   01/18/17 2342 01/19/17 0445 01/19/17 0806 01/19/17 1307  BP:  104/69 (!) 152/70 96/82  Pulse:  (!) 114 (!) 58 (!) 115  Resp: (!) 22 (!) 23 (!) 31 (!) 23  Temp: 98.1 F (36.7 C) 98.3 F (36.8 C) 98.7 F (37.1 C)   TempSrc: Oral Oral Oral   SpO2:  96% 100% (!) 82%  Weight:  200 lb (90.7 kg)    Height:        Intake/Output Summary (Last 24 hours) at 01/19/17 1349 Last data filed at 01/19/17 1248  Gross per 24 hour  Intake              690 ml  Output             3550 ml  Net            -2860 ml   Filed Weights   01/17/17 0543 01/18/17 0443 01/19/17 0445  Weight: 195 lb (88.5 kg) 198 lb 14.4 oz (90.2 kg) 200 lb (90.7 kg)   Body mass index is 27.89 kg/m.  General:  Acutely ill-appearing Caucasian male, currently on CPAP HEENT: normal Neck: no JVD Endocrine:  No thryomegaly Vascular: No carotid bruits;  Cardiac:  Irregularly irregular rhythm with 2/6 apical systolic murmur Lungs:  Lung sounds diminished with scattered crackles Abd: soft, nontender, no hepatomegaly  Ext: no edema  Musculoskeletal:  No deformities, patient has generalized weakness right lower leg is splinted and wrapped in right toes have purple bruising Skin: warm and dry  Neuro:  CNs 2-12 intact, no focal abnormalities noted Psych:  Normal affect   EKG:  The EKG was personally reviewed and demonstrates:   02/01/2017: Atrial fibrillation at a rate of 100 bpm with incomplete right bundle branch block 01/15/17: Atrial fibrillation have 125 bpm, incomplete right bundle branch block 01/18/17: A. fib/flutter at 93 bpm, RBBB, diffuse T-wave changes  Telemetry:  Telemetry was personally reviewed and demonstrates:   Atrial fibrillation in the 110s  Relevant CV Studies:  Echocardiogram 01/16/17 Study Conclusions  - Left ventricle: The cavity size was normal. Wall thickness was   increased in a pattern of mild LVH. Systolic function was normal.   The estimated  ejection fraction was in the range of 55% to 60%.   Wall motion was normal; there were no regional wall motion   abnormalities. The study is not technically sufficient to allow   evaluation of LV diastolic function. - Aortic valve: Trileaflet. Sclerosis without stenosis. There was   mild regurgitation. - Mitral valve: There was moderate regurgitation. - Left atrium: Severely dilated. - Right ventricle: The cavity size was moderately dilated. Systolic   function is mildly reduced. - Tricuspid valve: There was moderate regurgitation. - Pulmonic valve: There was trivial regurgitation. - Pulmonary arteries: PA peak pressure: 52 mm Hg (S). - Inferior vena cava: The vessel was dilated. The respirophasic   diameter changes were blunted (< 50%), consistent with elevated   central venous pressure.  Impressions: - Compared to a prior study in 09/2016, the LVEF is stable. There is   severe LAE, moderate RAE, moderate RVE and mild RV systolic   dysfunction. At least moderate MR and TR, RVSP 52 mmHg, dilated   IVC - atrial fibrillation with RVR is present.  Laboratory Data:  Chemistry Recent Labs Lab 01/17/17 0358 01/18/17 0446 01/18/17 1415 01/18/17 1853 01/19/17 0356  NA 136 134*  --   --  137  K 3.7 3.1* 3.0* 2.9* 3.7  CL 105 102  --   --  103  CO2 23 24  --   --  24  GLUCOSE 117* 103*  --   --  117*  BUN 27* 22*  --   --  17  CREATININE 1.27* 1.29*  --   --  1.24  CALCIUM 8.1* 7.9*  --   --  8.3*  GFRNONAA 49* 48*  --   --  50*  GFRAA 57* 56*  --   --  58*  ANIONGAP 8 8  --   --  10     Recent Labs Lab 02/03/2017 1756 01/15/17 1449 01/16/17 0253  PROT 5.6* 6.6 5.4*  ALBUMIN 2.4* 2.3* 2.1*  AST 594* 215* 147*  ALT 338* 271*  230*  ALKPHOS 136* 151* 148*  BILITOT 1.8* 1.8* 1.6*   Hematology Recent Labs Lab 01/17/17 0358 01/18/17 0446 01/19/17 0356  WBC 12.5* 12.3* 12.9*  RBC 3.19* 3.19* 3.48*  HGB 9.9* 9.8* 10.7*  HCT 29.5* 29.1* 32.2*  MCV 92.5 91.2 92.5  MCH 31.0 30.7 30.7  MCHC 33.6 33.7 33.2  RDW 14.6 14.3 14.7  PLT 226 244 336   Cardiac Enzymes Recent Labs Lab 01/29/2017 1756 02/10/2017 2029 01/15/17 0028 01/15/17 0732 01/16/17 0253  TROPONINI 0.04* 0.05* 0.04* 0.10* 0.08*   No results for input(s): TROPIPOC in the last 168 hours.  BNP Recent Labs Lab 02/04/2017 1756  BNP 668.2*    DDimer No results for input(s): DDIMER in the last 168 hours.  Radiology/Studies:  Dg Chest Port 1 View  Result Date: 01/19/2017 CLINICAL DATA:  Increasing shortness of breath. EXAM: PORTABLE CHEST 1 VIEW COMPARISON:  Most recent radiograph 01/16/2017.  Also 02/09/2017 FINDINGS: Stable cardiomegaly. Diffuse increased interstitial opacities, asymmetric and greater on the left, unchanged from prior exam. Suspect small pleural effusion. No confluent airspace disease. No pneumothorax. IMPRESSION: Cardiomegaly. Unchanged diffuse increased interstitial opacities, left greater than right, favor interstitial edema over atypical infection. Possible small left pleural effusion. Electronically Signed   By: Jeb Levering M.D.   On: 01/19/2017 02:00   Dg Chest Port 1 View  Result Date: 01/16/2017 CLINICAL DATA:  Shortness of breath and cough EXAM: PORTABLE CHEST 1 VIEW COMPARISON:  Portable chest x-ray of January 14, 2017 as well as PA and lateral chest x-rays of July 21, 2015 and March 20, 2014 FINDINGS: The lungs are well-expanded. The interstitial markings are diffusely increased and unchanged since yesterday's study. There is no alveolar infiltrate or pleural effusion. The cardiac silhouette remains enlarged and the pulmonary vascularity engorged. No significant pleural effusion is observed. IMPRESSION: CHF with diffuse  interstitial edema. Diffuse pneumonia is felt less likely. There has not been significant change since yesterday's study. Electronically Signed   By: David  Martinique M.D.   On: 01/16/2017 07:30    Assessment and Plan:   1. Persistent atrial fibrillation -Patient with persistent atrial fibrillation and multiple cardioversions after which he reverts back to atrial fib very soon. Was previously on amiodarone for years, unable to tolerate beta blockers, changed from amiodarone to Cardizem 180 mg daily in 11/2016 for symptomatic persistent atrial fibrillation. -CHA2DS2-VASc Score and unadjusted ischemic stroke rate (% per year) is equal to 4.8% stroke rate per year from a score of 4 (vascular disease, HTN, age (2)). The patient is anticoagulated with warfarin for stroke risk reduction. INR is 3.08 today -Echocardiogram on 01/16/17 showed  normal LV systolic function with EF 55-60% severe LAE, moderate RAE, moderate RVE and mild RV systolic dysfunction.  -Current inpatient management includes diltiazem 60 mg every 6 hours -Patient continues to be in atrial fibrillation with rates in the 110s.  -Difficult situation because cardioversion probably will not help, in the past the patient has reverted back to atrial fibrillation shortly after conversion and patient has severe LAE and moderate RAE. Has been seen recently by Dr. Curt Bears who had a discussion with the patient regarding medical therapy versus AV node ablation and pacemaker. The patient elected medical therapy given his advanced age.  2. Pulmonary edema -Patient was admitted with respiratory failure, dehydration, and acute renal insufficiency. Was given IV fluids with improvement of renal function. Patient has subsequently required Lasix for diuresis -Patient was some onset of dyspnea about an hour ago. Chest x-ray showed unchanged diffuse increased interstitial opacities, left greater than right, favoring interstitial edema over atypical infection,  possible small left pleural effusion -Today the patient developed hypoxia and dyspnea. Required increased oxygen and CPAP. -concern for diastolic dysfunction/right ventricular dysfunction/mitral regurgitation -IM ordered Lasix 40 mg IV  3. Hypertension - Blood pressure is well controlled  4. CAD -s/p stent OM 2002, s/p BMS 2006 RCA, s/p cath 2007. -Risk reduction strategy- patient is on statin. No aspirin due to need for anticoagulation, patient does not tolerate beta blocker  5. OSA - CPAP  6. Hypothyroidism -Treated with levothyroxine per internal medicine -TSH on 01/15/2017 was 0.789, was previously 4.36 on 09/28/16. Difference may be related to discontinuation of amiodarone. Synthroid dose has been reduced by internal medicine.  7. Hyper-lipidemia -Treated with pravastatin 40 mg daily -LDL was 52 on 01/17/17 is at goal for LDL < 70  8. Mitral regurgitation -Moderate on TEE in March, 2018 and again on echo 01/16/17  Signed, Daune Perch, NP  01/19/2017 1:49 PM  I have seen and examined the patient along with Daune Perch, NP.  I have reviewed the chart, notes and new data.  I agree with NP's note.  Key new complaints: dyspnea improving after diuretics Key examination changes: able to lie flat now, but still on BiPAP, no rales. AF with rate 110-119. On diltiazem IV 5 mg/h Key new findings / data: K 3.7 this AM (up from 2.9)  PLAN: Improving with  BiPAP and furosemide. Will give additional K supplement. Keep on IV diltiazem for now. The mild tachycardia is probably appropriate for his illness. Transition back to diltiazem SR 180 mg PO daily once he improves. He clearly has a very narrow margin of compensation between hypovolemia and acute CHF. And will require meticulous monitoring of sodium and fluid intake and daily weight monitoring.  Sanda Klein, MD, [place] [phone] 01/19/2017, 4:40 PM

## 2017-01-19 NOTE — Progress Notes (Signed)
Patient removed CPAP and was placed back on 10L [place] nasal cannula.  Patient refuses CPAP at this time.  Sats are currently 94%, RR 22, HR 104-115.  Patient does have breif periods of apnea and desaturation but recovers quickly.  BBS clear on R, fine crackles on L.  RN at bedside.

## 2017-01-20 DIAGNOSIS — I5031 Acute diastolic (congestive) heart failure: Secondary | ICD-10-CM

## 2017-01-20 DIAGNOSIS — I50813 Acute on chronic right heart failure: Secondary | ICD-10-CM

## 2017-01-20 DIAGNOSIS — I25118 Atherosclerotic heart disease of native coronary artery with other forms of angina pectoris: Secondary | ICD-10-CM

## 2017-01-20 LAB — BASIC METABOLIC PANEL
Anion gap: 10 (ref 5–15)
BUN: 14 mg/dL (ref 6–20)
CHLORIDE: 101 mmol/L (ref 101–111)
CO2: 25 mmol/L (ref 22–32)
CREATININE: 1.18 mg/dL (ref 0.61–1.24)
Calcium: 8.3 mg/dL — ABNORMAL LOW (ref 8.9–10.3)
GFR calc Af Amer: >60 mL/min (ref >60)
GFR calc non Af Amer: 54 mL/min — ABNORMAL LOW (ref >60)
Glucose, Bld: 116 mg/dL — ABNORMAL HIGH (ref 65–99)
POTASSIUM: 3.8 mmol/L (ref 3.5–5.1)
SODIUM: 136 mmol/L (ref 135–145)

## 2017-01-20 LAB — CBC
HEMATOCRIT: 30.8 % — AB (ref 39.0–52.0)
Hemoglobin: 10.4 g/dL — ABNORMAL LOW (ref 13.0–17.0)
MCH: 30.8 pg (ref 26.0–34.0)
MCHC: 33.8 g/dL (ref 30.0–36.0)
MCV: 91.1 fL (ref 78.0–100.0)
PLATELETS: 335 10*3/uL (ref 150–400)
RBC: 3.38 MIL/uL — ABNORMAL LOW (ref 4.22–5.81)
RDW: 14.5 % (ref 11.5–15.5)
WBC: 13.2 10*3/uL — ABNORMAL HIGH (ref 4.0–10.5)

## 2017-01-20 LAB — PROTIME-INR
INR: 2.92
Prothrombin Time: 31.1 seconds — ABNORMAL HIGH (ref 11.4–15.2)

## 2017-01-20 MED ORDER — WARFARIN SODIUM 1 MG PO TABS
1.0000 mg | ORAL_TABLET | Freq: Once | ORAL | Status: AC
  Administered 2017-01-20: 1 mg via ORAL
  Filled 2017-01-20: qty 1

## 2017-01-20 NOTE — Plan of Care (Signed)
Problem: Safety: Goal: Ability to remain free from injury will improve Outcome: Progressing No injuries, falls or skin breakdown this shift. Fall risk bundle in place and pt is in a low bed. Pt repositioned and heels elevated. Will continue to monitor and assess this shift.   Problem: Pain Managment: Goal: General experience of comfort will improve Outcome: Progressing Pt complained of 5/10 right leg pain once this shift. Tylenol given per orders and relieved pt's pain. Will continue to monitor and assess.

## 2017-01-20 NOTE — Progress Notes (Signed)
Progress Note  Patient Name: [name] Date of Encounter: 01/20/2017  Primary Cardiologist: Percival Spanish Primary electrophysiologist: Camnitz  Subjective   Says breathing has improved. Feels worn out with simple activities like eating.  Inpatient Medications    Scheduled Meds: . clonazePAM  1 mg Oral QHS  . doxazosin  1 mg Oral Daily  . finasteride  5 mg Oral Daily  . furosemide  40 mg Intravenous Q12H  . levothyroxine  50 mcg Oral QAC breakfast  . megestrol  800 mg Oral Daily  . multivitamin with minerals  1 tablet Oral Daily  . omega-3 acid ethyl esters  1 g Oral Daily  . pantoprazole  40 mg Oral BID  . potassium chloride  20 mEq Oral TID  . pravastatin  40 mg Oral QPM  . saccharomyces boulardii  250 mg Oral BID  . traZODone  25 mg Oral QHS  . Warfarin - Pharmacist Dosing Inpatient   Does not apply q1800   Continuous Infusions: . sodium chloride 10 mL/hr at 01/20/17 0440  . diltiazem (CARDIZEM) infusion 10 mg/hr (01/20/17 1000)   PRN Meds: acetaminophen, dextromethorphan-guaiFENesin, hydroxypropyl methylcellulose / hypromellose, levalbuterol, morphine injection, nitroGLYCERIN, oxyCODONE   Vital Signs    Vitals:   01/20/17 0100 01/20/17 0543 01/20/17 0653 01/20/17 0806  BP:  114/85  120/77  Pulse: (!) 112 (!) 129 (!) 112   Resp: (!) 25 (!) 26 16   Temp:  (!) 97.4 F (36.3 C)    TempSrc:  Oral    SpO2: 92% 93% 95% 93%  Weight:  180 lb (81.6 kg)    Height:        Intake/Output Summary (Last 24 hours) at 01/20/17 1041 Last data filed at 01/20/17 0900  Gross per 24 hour  Intake              345 ml  Output             4900 ml  Net            -4555 ml   Filed Weights   01/18/17 0443 01/19/17 0445 01/20/17 0543  Weight: 198 lb 14.4 oz (90.2 kg) 200 lb (90.7 kg) 180 lb (81.6 kg)    Telemetry    Rapid atrial fibrillation - Personally Reviewed  ECG    Physical Exam   GEN: No acute distress. Ill appearing.   Neck: No JVD Cardiac: Tachycardic,  irregular rhythm.  Respiratory: Diminished with diffuse crackles. GI: Soft, nontender, non-distended  MS: No edema; Left leg bandaged. Neuro:  Nonfocal  Psych: Normal affect   Labs    Chemistry Recent Labs Lab 02/13/2017 1756  01/15/17 1449 01/16/17 0253  01/18/17 0446  01/18/17 1853 01/19/17 0356 01/20/17 0222  NA 128*  < > 132* 133*  < > 134*  --   --  137 136  K 4.0  < > 3.8 3.6  < > 3.1*  < > 2.9* 3.7 3.8  CL 97*  < > 101 103  < > 102  --   --  103 101  CO2 22  < > 18* 22  < > 24  --   --  24 25  GLUCOSE 144*  < > 147* 139*  < > 103*  --   --  117* 116*  BUN 44*  < > 37* 38*  < > 22*  --   --  17 14  CREATININE 2.90*  < > 1.81* 1.59*  < > 1.29*  --   --  1.24 1.18  CALCIUM 7.8*  < > 7.9* 8.0*  < > 7.9*  --   --  8.3* 8.3*  PROT 5.6*  --  6.6 5.4*  --   --   --   --   --   --   ALBUMIN 2.4*  --  2.3* 2.1*  --   --   --   --   --   --   AST 594*  --  215* 147*  --   --   --   --   --   --   ALT 338*  --  271* 230*  --   --   --   --   --   --   ALKPHOS 136*  --  151* 148*  --   --   --   --   --   --   BILITOT 1.8*  --  1.8* 1.6*  --   --   --   --   --   --   GFRNONAA 18*  < > 32* 37*  < > 48*  --   --  50* 54*  GFRAA 21*  < > 37* 43*  < > 56*  --   --  58* >60  ANIONGAP 9  < > 13 8  < > 8  --   --  10 10  < > = values in this interval not displayed.   Hematology Recent Labs Lab 01/18/17 0446 01/19/17 0356 01/20/17 0222  WBC 12.3* 12.9* 13.2*  RBC 3.19* 3.48* 3.38*  HGB 9.8* 10.7* 10.4*  HCT 29.1* 32.2* 30.8*  MCV 91.2 92.5 91.1  MCH 30.7 30.7 30.8  MCHC 33.7 33.2 33.8  RDW 14.3 14.7 14.5  PLT 244 336 335    Cardiac Enzymes Recent Labs Lab 01/17/2017 2029 01/15/17 0028 01/15/17 0732 01/16/17 0253  TROPONINI 0.05* 0.04* 0.10* 0.08*   No results for input(s): TROPIPOC in the last 168 hours.   BNP Recent Labs Lab 01/30/2017 1756  BNP 668.2*     DDimer No results for input(s): DDIMER in the last 168 hours.   Radiology    Dg Chest Port 1  View  Result Date: 01/19/2017 CLINICAL DATA:  Increasing shortness of breath. EXAM: PORTABLE CHEST 1 VIEW COMPARISON:  Most recent radiograph 01/16/2017.  Also 02/04/2017 FINDINGS: Stable cardiomegaly. Diffuse increased interstitial opacities, asymmetric and greater on the left, unchanged from prior exam. Suspect small pleural effusion. No confluent airspace disease. No pneumothorax. IMPRESSION: Cardiomegaly. Unchanged diffuse increased interstitial opacities, left greater than right, favor interstitial edema over atypical infection. Possible small left pleural effusion. Electronically Signed   By: Jeb Levering M.D.   On: 01/19/2017 02:00    Cardiac Studies   Echocardiogram 01/16/17 Study Conclusions  - Left ventricle: The cavity size was normal. Wall thickness was increased in a pattern of mild LVH. Systolic function was normal. The estimated ejection fraction was in the range of 55% to 60%. Wall motion was normal; there were no regional wall motion abnormalities. The study is not technically sufficient to allow evaluation of LV diastolic function. - Aortic valve: Trileaflet. Sclerosis without stenosis. There was mild regurgitation. - Mitral valve: There was moderate regurgitation. - Left atrium: Severely dilated. - Right ventricle: The cavity size was moderately dilated. Systolic function is mildly reduced. - Tricuspid valve: There was moderate regurgitation. - Pulmonic valve: There was trivial regurgitation. - Pulmonary arteries: PA peak pressure: 52 mm Hg (S). - Inferior vena cava:  The vessel was dilated. The respirophasic diameter changes were blunted (<50%), consistent with elevated central venous pressure.  Impressions: - Compared to a prior study in 09/2016, the LVEF is stable. There is severe LAE, moderate RAE, moderate RVE and mild RV systolic dysfunction. At least moderate MR and TR, RVSP 52 mmHg, dilated IVC - atrial fibrillation with RVR is  present.  Patient Profile     83 y.o. male  with a hx of persistant atrial fibrillation with 7 cardioversions since 03/2014 and warfarin therapy, moderate MR, CAD status post stent placement, hyperlipidemia, GERD, hypothyroidism, OSA on CPAP, BPH, iron deficiency anemia, CK D stage III, depression and anxiety  who is being seen today for the evaluation of atrial fibrillation with rapid ventricular response at the request of Dr Thereasa Solo.  Assessment & Plan    1. Persistent atrial fibrillation -Patient with persistent atrial fibrillation and multiple cardioversions after which he reverts back to atrial fib very soon. Was previously on amiodarone for years, unable to tolerate beta blockers, changed from amiodarone to Cardizem 180 mg daily in 11/2016 for symptomatic persistent atrial fibrillation. -CHA2DS2-VASc Score and unadjusted ischemic stroke rate (% per year) is equal to 4.8% stroke rate per year from a score of 4 (vascular disease, HTN, age (2)). The patient is anticoagulated with warfarin for stroke risk reduction. INR is 2.92 today -Echocardiogram on 01/16/17 showed normal LV systolic function with EF 55-60% severe LAE, moderate RAE, moderate RVE and mild RV systolic dysfunction.  -Current inpatient management includes IV diltiazem infusion currently at 10 mg/hr. I will increase to 12.5 mg/hr. Will transition back to long acting diltiazem once he has improved. -Patient continues to be in atrial fibrillation with rates in the 110-120s.  -Difficult situation because cardioversion probably will not help, in the past the patient has reverted back to atrial fibrillation shortly after conversion and patient has severe LAE and moderate RAE. Has been seen recently by Dr. Curt Bears who had a discussion with the patient regarding medical therapy versus AV node ablation and pacemaker. The patient elected medical therapy given his advanced age.  2. Pulmonary edema -Patient was admitted with respiratory failure,  dehydration, and acute renal insufficiency. Was given IV fluids with improvement of renal function. Patient has subsequently required Lasix for diuresis - Chest x-ray showed unchanged diffuse increased interstitial opacities, left greater than right, favoring interstitial edema over atypical infection, possible small left pleural effusion -Required increased oxygen and CPAP. -concern for diastolic dysfunction/right ventricular dysfunction/mitral regurgitation -IM ordered Lasix 40 mg IV bid with over 4.8 L out in last 24 hrs. Renal function appears stable.  3. Hypertension - Blood pressure is well controlled  4. CAD -s/p stent OM 2002, s/p BMS 2006 RCA, s/p cath 2007. -Risk reduction strategy- patient is on statin. No aspirin due to need for anticoagulation, patient does not tolerate beta blocker  5. OSA - CPAP  6. Hypothyroidism -Treated with levothyroxine per internal medicine -TSH on 01/15/2017 was 0.789, was previously 4.36 on 09/28/16. Difference may be related to discontinuation of amiodarone. Synthroid dose has been reduced by internal medicine.  7. Hyper-lipidemia -Treated with pravastatin 40 mg daily -LDL was 52 on 01/17/17 is at goal for LDL < 70  8. Mitral regurgitation -Moderate on TEE in March, 2018 and again on echo 01/16/17  Signed, Kate Sable, MD  01/20/2017, 10:41 AM

## 2017-01-20 NOTE — Progress Notes (Signed)
[place] TEAM 1 - Stepdown/ICU TEAM  [name]  WRU:[id number] DOB: Nov 25, 1926 DOA: 01/20/2017 PCP: Marletta Lor, MD    Brief Narrative:  83 y.o. male with history of Hypertension, hyperlipidemia, GERD, hypothyroidism, depression, anxiety, OSA on CPAP, A. fib on Coumadin, popliteal artery embolism, MV regurg, CAD s/p stent placement, BPH, iron deficiency anemia, and CKD3 who presented with confusion, cough, shortness breath, and nausea w/ some limited vomiting.  Pt was hospitalized for a R tibia/fib fracture 6/22-6/25. Patient had surgery, and was discharged to a rehab facility. Per his son he began to be confused and lethargic, w/ poor appetite and decreased oral intake, as well as oxygen desaturations to 60-70%.   ED w/u revealed WBC 14.9, sodium 128, AST 594, ALT 338, total bilirubin 1.8, tachycardia, and tachypnea. CXR showed interstitial opacity, worse on the left. .  Subjective: The pt gets very SOB w/ the slightest exertion.  He presently denies cp, n/v, or abdom pain.    Assessment & Plan:  Acute hypoxic respiratory failure - pulmonary edema  difficult to achieve volume balance in setting of RV failure/Pulm HTN/RVR - suspect MVR + RHF + Afib w/ RVR all contributing to these recurring episodes of flash pulmonary edema - cont to diurese as renal fxn and BP allow   Parox Afib on coumadin CHA2DS2 VASc score is 4 - s/p TEE DCCV March 2018 - is intolerant to BBs per Cardiology outpt notes - evaluated by EP May 2018 at which time amio was noted to not be working for his afib - rate has proven difficult to control, and RVR appears to be playing a role in exacerbating his pulmonary edema - Cards following - on cardizem gtt since yesterday afternoon w/ RVR still suboptimally controlled - increasing rate this morning    R heart failure - Mitral valve regurg - Tricuspid valve regurg TTE notes severe LAE, mode RAE, moderate RVE, and mild RV systolic dysfxn w/ moderate MR and TR - RVSP  noted to be 52 - volume proving difficult to balance w/ pt swinging quickly from [facility] to volume overload - cont scheduled diuretic for now - net negative ~4L since admit  Filed Weights   01/18/17 0443 01/19/17 0445 01/20/17 0543  Weight: 90.2 kg (198 lb 14.4 oz) 90.7 kg (200 lb) 81.6 kg (180 lb)    SIRS  I suspect his blood cx is a contaminant, and I doubt he has actually had an acute infection - following off abx   Transaminitis - shock liver v/s congestive hepatopathy  possible shock liver - acute viral hepatitis panel negative - no evidence of acute gallbladder disease on ultrasound - steadily improving w/ volume expansion - follow w/ diuresis - may also be a component of congestive hepatopathy   Acute metabolic encephalopathy no acute findings on CT head  - suspect this was related to dehydration and hyponatremia  - B-14 and folic acid levels normal - perhaps related to trazodone and hydrocodone administered in the skilled nursing facility as well - now at baseline mental status   Hyponatremia Sodium has normalized  Recent Labs Lab 01/16/17 0253 01/17/17 0358 01/18/17 0446 01/19/17 0356 01/20/17 0222  NA 133* 136 134* 137 136   Acute renal injury on CKD Stage 3 Baseline crt ~1.2 - suspect pre-renal azotemia - crt normalizing w/ hydration but now must diuresis - follow trend w/ scheduled lasix   Recent Labs Lab 01/16/17 0253 01/17/17 0358 01/18/17 0446 01/19/17 0356 01/20/17 0222  CREATININE 1.59*  1.27* 1.29* 1.24 1.18    Hypothyroidism continue home Synthroid but in setting of persistent tachycardia will decrease dose/aim for less aggressive TSH goal   R closed Tib/Fib fx no evidence of acute complications at this time - continue PT/OT - due for Ortho f/u 7/10 - will ask Dr. Rush Farmer to see pt while he is in the hospital (non-urgent)  HLD  HTN Not an active problem at this time d  CAD Asymptomatic presently - followed by Dr. Percival Spanish in the outpatient  setting  GERD  OSA Home CPAP   DVT prophylaxis: warfarin Code Status: DNR - NO CODE Family Communication: no family in room at time of my visit Disposition Plan: SDU  Consultants:  [facility]   Procedures: none  Antimicrobials:  Zosyn 7/1 > 7/6 Vancomycin 7/1 > 7/6  Objective: Blood pressure 120/77, pulse (!) 112, temperature (!) 97.4 F (36.3 C), temperature source Oral, resp. rate 16, height 5\' 11"  (1.803 m), weight 81.6 kg (180 lb), SpO2 93 %.  Intake/Output Summary (Last 24 hours) at 01/20/17 1131 Last data filed at 01/20/17 0900  Gross per 24 hour  Intake              345 ml  Output             4900 ml  Net            -4555 ml   Filed Weights   01/18/17 0443 01/19/17 0445 01/20/17 0543  Weight: 90.2 kg (198 lb 14.4 oz) 90.7 kg (200 lb) 81.6 kg (180 lb)    Examination: General: Alert and conversant  - SOB w/ exertion  Lungs: improved air movement - no wheezing - bibasilar crackles  Cardiovascular: irregularly irregular w/ RVR  Abdomen: Nontender, nondistended, soft, bowel sounds positive, no rebound Extremities: Right lower extremity in cast w/o change - left lower extremity with 1+ edema  CBC:  Recent Labs Lab 01/28/2017 1756 01/16/17 0253 01/17/17 0358 01/18/17 0446 01/19/17 0356 01/20/17 0222  WBC 14.9* 13.1* 12.5* 12.3* 12.9* 13.2*  NEUTROABS 12.3*  --   --   --   --   --   HGB 9.4* 9.3* 9.9* 9.8* 10.7* 10.4*  HCT 27.4* 28.2* 29.5* 29.1* 32.2* 30.8*  MCV 90.7 91.9 92.5 91.2 92.5 91.1  PLT 244 225 226 244 336 937   Basic Metabolic Panel:  Recent Labs Lab 01/16/17 0253 01/17/17 0358 01/18/17 0446 01/18/17 1415 01/18/17 1853 01/19/17 0356 01/20/17 0222  NA 133* 136 134*  --   --  137 136  K 3.6 3.7 3.1* 3.0* 2.9* 3.7 3.8  CL 103 105 102  --   --  103 101  CO2 22 23 24   --   --  24 25  GLUCOSE 139* 117* 103*  --   --  117* 116*  BUN 38* 27* 22*  --   --  17 14  CREATININE 1.59* 1.27* 1.29*  --   --  1.24 1.18  CALCIUM 8.0* 8.1*  7.9*  --   --  8.3* 8.3*  MG  --  1.9  --  1.7 2.2 2.2  --    GFR: Estimated Creatinine Clearance: 47 mL/min (by C-G formula based on SCr of 1.18 mg/dL).  Liver Function Tests:  Recent Labs Lab 01/29/2017 1756 01/15/17 1449 01/16/17 0253  AST 594* 215* 147*  ALT 338* 271* 230*  ALKPHOS 136* 151* 148*  BILITOT 1.8* 1.8* 1.6*  PROT 5.6* 6.6 5.4*  ALBUMIN 2.4* 2.3* 2.1*  Recent Labs Lab 02/04/2017 2029  LIPASE 19    Recent Labs Lab 02/11/2017 1952  AMMONIA <9*    Coagulation Profile:  Recent Labs Lab 01/16/17 0253 01/17/17 0358 01/18/17 0446 01/19/17 0356 01/20/17 0222  INR 2.76 2.75 3.04 3.08 2.92    Cardiac Enzymes:  Recent Labs Lab 01/30/2017 1756 01/28/2017 2029 01/15/17 0028 01/15/17 0732 01/16/17 0253  TROPONINI 0.04* 0.05* 0.04* 0.10* 0.08*    Recent Results (from the past 240 hour(s))  Urine Culture     Status: None   Collection Time: 01/23/2017  1:25 AM  Result Value Ref Range Status   Specimen Description URINE, RANDOM  Final   Special Requests NONE  Final   Culture NO GROWTH  Final   Report Status 01/16/2017 FINAL  Final  Blood Culture (routine x 2)     Status: Abnormal   Collection Time: 01/24/2017  5:36 PM  Result Value Ref Range Status   Specimen Description BLOOD LEFT FOREARM  Final   Special Requests IN PEDIATRIC BOTTLE Blood Culture adequate volume  Final   Culture  Setup Time   Final    GRAM POSITIVE COCCI IN CLUSTERS IN PEDIATRIC BOTTLE CRITICAL RESULT CALLED TO, READ BACK BY AND VERIFIED WITH: F. Bolindale, AT [phone] 01/16/17 BY D. VANHOOK    Culture (A)  Final    STAPHYLOCOCCUS SPECIES (COAGULASE NEGATIVE) THE SIGNIFICANCE OF ISOLATING THIS ORGANISM FROM A SINGLE SET OF BLOOD CULTURES WHEN MULTIPLE SETS ARE DRAWN IS UNCERTAIN. PLEASE NOTIFY THE MICROBIOLOGY DEPARTMENT WITHIN ONE WEEK IF SPECIATION AND SENSITIVITIES ARE REQUIRED.    Report Status 01/18/2017 FINAL  Final  Blood Culture (routine x 2)     Status: None   Collection  Time: 02/08/2017  5:56 PM  Result Value Ref Range Status   Specimen Description BLOOD LEFT WRIST  Final   Special Requests   Final    BOTTLES DRAWN AEROBIC AND ANAEROBIC Blood Culture adequate volume   Culture NO GROWTH 5 DAYS  Final   Report Status 01/19/2017 FINAL  Final  Respiratory Panel by PCR     Status: None   Collection Time: 01/15/17 12:42 AM  Result Value Ref Range Status   Adenovirus NOT DETECTED NOT DETECTED Final   Coronavirus 229E NOT DETECTED NOT DETECTED Final   Coronavirus HKU1 NOT DETECTED NOT DETECTED Final   Coronavirus NL63 NOT DETECTED NOT DETECTED Final   Coronavirus OC43 NOT DETECTED NOT DETECTED Final   Metapneumovirus NOT DETECTED NOT DETECTED Final   Rhinovirus / Enterovirus NOT DETECTED NOT DETECTED Final   Influenza A NOT DETECTED NOT DETECTED Final   Influenza B NOT DETECTED NOT DETECTED Final   Parainfluenza Virus 1 NOT DETECTED NOT DETECTED Final   Parainfluenza Virus 2 NOT DETECTED NOT DETECTED Final   Parainfluenza Virus 3 NOT DETECTED NOT DETECTED Final   Parainfluenza Virus 4 NOT DETECTED NOT DETECTED Final   Respiratory Syncytial Virus NOT DETECTED NOT DETECTED Final   Bordetella pertussis NOT DETECTED NOT DETECTED Final   Chlamydophila pneumoniae NOT DETECTED NOT DETECTED Final   Mycoplasma pneumoniae NOT DETECTED NOT DETECTED Final  MRSA PCR Screening     Status: None   Collection Time: 01/15/17 12:44 AM  Result Value Ref Range Status   MRSA by PCR NEGATIVE NEGATIVE Final    Comment:        The GeneXpert MRSA Assay (FDA approved for NASAL specimens only), is one component of a comprehensive MRSA colonization surveillance program. It is not intended to diagnose MRSA  infection nor to guide or monitor treatment for MRSA infections.      Scheduled Meds: . clonazePAM  1 mg Oral QHS  . doxazosin  1 mg Oral Daily  . finasteride  5 mg Oral Daily  . furosemide  40 mg Intravenous Q12H  . levothyroxine  50 mcg Oral QAC breakfast  . megestrol   800 mg Oral Daily  . multivitamin with minerals  1 tablet Oral Daily  . omega-3 acid ethyl esters  1 g Oral Daily  . pantoprazole  40 mg Oral BID  . potassium chloride  20 mEq Oral TID  . pravastatin  40 mg Oral QPM  . saccharomyces boulardii  250 mg Oral BID  . traZODone  25 mg Oral QHS  . Warfarin - Pharmacist Dosing Inpatient   Does not apply q1800     LOS: 6 days   Cherene Altes, MD [facility] Office  [phone] Pager - Text Page per Amion as per below:  On-Call/Text Page:      [email or web address]      password [email or web address]  If 7PM-7AM, please contact night-coverage [email or web address] Password [email or web address] 01/20/2017, 11:31 AM

## 2017-01-20 NOTE — Progress Notes (Signed)
Pt placed on cpap to take a nap, patient is resting and comfortable

## 2017-01-20 NOTE — Progress Notes (Signed)
ANTICOAGULATION CONSULT NOTE - Follow up [place] for warfarin Indication: atrial fibrillation  Allergies  Allergen Reactions  . Metoprolol Tartrate Other (See Comments)    Anxiety, heart rate goes too low, feels like in a fog.    Patient Measurements: Height: 5\' 11"  (180.3 cm) Weight: 180 lb (81.6 kg) IBW/kg (Calculated) : 75.3  Vital Signs: Temp: 97.4 F (36.3 C) (07/07 0543) Temp Source: Oral (07/07 0543) BP: 120/77 (07/07 0806) Pulse Rate: 112 (07/07 0653)  Labs:  Recent Labs  01/18/17 0446 01/19/17 0356 01/20/17 0222  HGB 9.8* 10.7* 10.4*  HCT 29.1* 32.2* 30.8*  PLT 244 336 335  LABPROT 32.1* 32.5* 31.1*  INR 3.04 3.08 2.92  CREATININE 1.29* 1.24 1.18    Estimated Creatinine Clearance: 47 mL/min (by C-G formula based on SCr of 1.18 mg/dL).  Medications:  Warfarin PTA 2mg  daily  Assessment: 25 yoM on chronic coumadin for history of afib. INR was therapeutic upon admission 2.57. Missed dose on 7/1.  INR 3> 2.9 after lower dosing Patient with poor-to-moderate PO intake. Elevated LFTs resolving, CBC low stable  - no bleeding noted    Goal of Therapy:  INR 2-3 Monitor platelets by anticoagulation protocol: Yes   Plan:  -Warfarin 1 mg PO x 1 again  Tonight -Daily INR, CBC -Monitor S/Sx bleeding, PO intake, DDI, clinical picture  Bonnita Nasuti Pharm.D. CPP, BCPS Clinical Pharmacist [phone] 01/20/2017 11:57 AM

## 2017-01-21 LAB — COMPREHENSIVE METABOLIC PANEL
ALBUMIN: 2.2 g/dL — AB (ref 3.5–5.0)
ALK PHOS: 104 U/L (ref 38–126)
ALT: 60 U/L (ref 17–63)
AST: 31 U/L (ref 15–41)
Anion gap: 10 (ref 5–15)
BUN: 16 mg/dL (ref 6–20)
CHLORIDE: 98 mmol/L — AB (ref 101–111)
CO2: 26 mmol/L (ref 22–32)
CREATININE: 1.19 mg/dL (ref 0.61–1.24)
Calcium: 8.5 mg/dL — ABNORMAL LOW (ref 8.9–10.3)
GFR calc Af Amer: >60 mL/min (ref >60)
GFR calc non Af Amer: 53 mL/min — ABNORMAL LOW (ref >60)
GLUCOSE: 113 mg/dL — AB (ref 65–99)
Potassium: 4.5 mmol/L (ref 3.5–5.1)
SODIUM: 134 mmol/L — AB (ref 135–145)
Total Bilirubin: 1.3 mg/dL — ABNORMAL HIGH (ref 0.3–1.2)
Total Protein: 5.8 g/dL — ABNORMAL LOW (ref 6.5–8.1)

## 2017-01-21 LAB — CBC
HCT: 32 % — ABNORMAL LOW (ref 39.0–52.0)
Hemoglobin: 10.6 g/dL — ABNORMAL LOW (ref 13.0–17.0)
MCH: 30.3 pg (ref 26.0–34.0)
MCHC: 33.1 g/dL (ref 30.0–36.0)
MCV: 91.4 fL (ref 78.0–100.0)
PLATELETS: 371 10*3/uL (ref 150–400)
RBC: 3.5 MIL/uL — AB (ref 4.22–5.81)
RDW: 14.5 % (ref 11.5–15.5)
WBC: 13.3 10*3/uL — ABNORMAL HIGH (ref 4.0–10.5)

## 2017-01-21 LAB — PROTIME-INR
INR: 2.65
Prothrombin Time: 28.8 seconds — ABNORMAL HIGH (ref 11.4–15.2)

## 2017-01-21 MED ORDER — WARFARIN SODIUM 2 MG PO TABS
2.0000 mg | ORAL_TABLET | Freq: Once | ORAL | Status: AC
  Administered 2017-01-21: 2 mg via ORAL
  Filled 2017-01-21: qty 1

## 2017-01-21 MED ORDER — DILTIAZEM HCL ER 90 MG PO CP12
90.0000 mg | ORAL_CAPSULE | Freq: Two times a day (BID) | ORAL | Status: DC
  Administered 2017-01-21 (×2): 90 mg via ORAL
  Filled 2017-01-21 (×4): qty 1

## 2017-01-21 MED ORDER — WARFARIN SODIUM 1 MG PO TABS: 1.0000 mg | ORAL_TABLET | Freq: Once | ORAL | Status: DC

## 2017-01-21 MED ORDER — LORAZEPAM 2 MG/ML IJ SOLN
0.5000 mg | INTRAMUSCULAR | Status: DC | PRN
  Administered 2017-01-21: 1 mg via INTRAVENOUS
  Filled 2017-01-21: qty 1

## 2017-01-21 NOTE — Plan of Care (Signed)
Problem: Safety: Goal: Ability to remain free from injury will improve Outcome: Progressing No falls this shift. Pt is on a specialty low bed with alarm on. Fall risk bundle in place. Pt is restless and continues to pull oxygen off causing him to desat. Hospitalist notified and order for Ativan obtained. Pt is now resting comfortably in bed. VS stable. Oxygen saturation at 95%.    Problem: Skin Integrity: Goal: Risk for impaired skin integrity will decrease Outcome: Progressing No skin break down or other injuries this shift. Pt repositioned q2h with pillows supporting bony prominences.

## 2017-01-21 NOTE — Progress Notes (Signed)
ANTICOAGULATION CONSULT NOTE - Follow up [place] for warfarin Indication: atrial fibrillation  Allergies  Allergen Reactions  . Metoprolol Tartrate Other (See Comments)    Anxiety, heart rate goes too low, feels like in a fog.    Patient Measurements: Height: 5\' 11"  (180.3 cm) Weight: 180 lb (81.6 kg) IBW/kg (Calculated) : 75.3  Vital Signs: Temp: 98.4 F (36.9 C) (07/08 0445) Temp Source: Axillary (07/08 0445) BP: 102/60 (07/08 1212) Pulse Rate: 110 (07/08 1212)  Labs:  Recent Labs  01/19/17 0356 01/20/17 0222 01/21/17 0343  HGB 10.7* 10.4* 10.6*  HCT 32.2* 30.8* 32.0*  PLT 336 335 371  LABPROT 32.5* 31.1* 28.8*  INR 3.08 2.92 2.65  CREATININE 1.24 1.18 1.19    Estimated Creatinine Clearance: 46.6 mL/min (by C-G formula based on SCr of 1.19 mg/dL).  Medications:  Warfarin PTA 2mg  daily  Assessment: 84 yoM on chronic coumadin for history of afib. INR was therapeutic upon admission 2.57. Missed dose on 7/1.  INR 2.9-> 2.65 trending downward quickly after dosing warfarin 1mg , Patient with poor-to-moderate PO intake, at 50% today Elevated LFTs resolving, CBC low stable  - no bleeding noted    Goal of Therapy:  INR 2-3 Monitor platelets by anticoagulation protocol: Yes   Plan:  -Increase warfarin to 2mg  tonight -Daily INR, CBC -Monitor S/Sx bleeding, PO intake, DDI, clinical picture  Nida Boatman, PharmD PGY1 Acute Care Pharmacy Resident Pager: [phone] 01/21/2017 2:03 PM

## 2017-01-21 NOTE — Progress Notes (Signed)
Progress Note  Patient Name: [name] Date of Encounter: 01/21/2017  Primary Cardiologist: Percival Spanish Primary electrophysiologist: Careli Luzader  Subjective   Weak and tired. Minimal appetite. Minimal exertion makes him fatigued and SOB.  Inpatient Medications    Scheduled Meds: . clonazePAM  1 mg Oral QHS  . doxazosin  1 mg Oral Daily  . finasteride  5 mg Oral Daily  . furosemide  40 mg Intravenous Q12H  . levothyroxine  50 mcg Oral QAC breakfast  . megestrol  800 mg Oral Daily  . multivitamin with minerals  1 tablet Oral Daily  . omega-3 acid ethyl esters  1 g Oral Daily  . pantoprazole  40 mg Oral BID  . potassium chloride  20 mEq Oral TID  . pravastatin  40 mg Oral QPM  . saccharomyces boulardii  250 mg Oral BID  . traZODone  25 mg Oral QHS  . Warfarin - Pharmacist Dosing Inpatient   Does not apply q1800   Continuous Infusions: . sodium chloride 10 mL/hr at 01/21/17 0742  . diltiazem (CARDIZEM) infusion 12.5 mg/hr (01/21/17 0741)   PRN Meds: acetaminophen, dextromethorphan-guaiFENesin, hydroxypropyl methylcellulose / hypromellose, levalbuterol, LORazepam, morphine injection, nitroGLYCERIN, oxyCODONE   Vital Signs    Vitals:   01/21/17 0308 01/21/17 0445 01/21/17 0550 01/21/17 1212  BP:  (!) 98/59  102/60  Pulse: (!) 134 (!) 107 (!) 112 (!) 110  Resp: 17 18  (!) 26  Temp:  98.4 F (36.9 C)    TempSrc:  Axillary    SpO2: 95% 92% 95% 92%  Weight:      Height:        Intake/Output Summary (Last 24 hours) at 01/21/17 1302 Last data filed at 01/21/17 1000  Gross per 24 hour  Intake              570 ml  Output             2500 ml  Net            -1930 ml   Filed Weights   01/18/17 0443 01/19/17 0445 01/20/17 0543  Weight: 198 lb 14.4 oz (90.2 kg) 200 lb (90.7 kg) 180 lb (81.6 kg)    Telemetry    Atrial fibrillation with rapid rates - Personally Reviewed  ECG   None new  Physical Exam   GEN: Well nourished, well developed, in no acute distress    HEENT: normal  Neck: no JVD, carotid bruits, or masses Cardiac: tachycardic, irregular; no murmurs, rubs, or gallops,no edema  Respiratory:  clear to auscultation bilaterally, normal work of breathing GI: soft, nontender, nondistended, + BS MS: no deformity or atrophy  Skin: warm and dry Neuro:  Strength and sensation are intact Psych: euthymic mood, full affect   Labs    Chemistry Recent Labs Lab 01/15/17 1449 01/16/17 0253  01/19/17 0356 01/20/17 0222 01/21/17 0343  NA 132* 133*  < > 137 136 134*  K 3.8 3.6  < > 3.7 3.8 4.5  CL 101 103  < > 103 101 98*  CO2 18* 22  < > 24 25 26   GLUCOSE 147* 139*  < > 117* 116* 113*  BUN 37* 38*  < > 17 14 16   CREATININE 1.81* 1.59*  < > 1.24 1.18 1.19  CALCIUM 7.9* 8.0*  < > 8.3* 8.3* 8.5*  PROT 6.6 5.4*  --   --   --  5.8*  ALBUMIN 2.3* 2.1*  --   --   --  2.2*  AST 215* 147*  --   --   --  31  ALT 271* 230*  --   --   --  60  ALKPHOS 151* 148*  --   --   --  104  BILITOT 1.8* 1.6*  --   --   --  1.3*  GFRNONAA 32* 37*  < > 50* 54* 53*  GFRAA 37* 43*  < > 58* >60 >60  ANIONGAP 13 8  < > 10 10 10   < > = values in this interval not displayed.   Hematology  Recent Labs Lab 01/19/17 0356 01/20/17 0222 01/21/17 0343  WBC 12.9* 13.2* 13.3*  RBC 3.48* 3.38* 3.50*  HGB 10.7* 10.4* 10.6*  HCT 32.2* 30.8* 32.0*  MCV 92.5 91.1 91.4  MCH 30.7 30.8 30.3  MCHC 33.2 33.8 33.1  RDW 14.7 14.5 14.5  PLT 336 335 371    Cardiac Enzymes  Recent Labs Lab 01/31/2017 2029 01/15/17 0028 01/15/17 0732 01/16/17 0253  TROPONINI 0.05* 0.04* 0.10* 0.08*   No results for input(s): TROPIPOC in the last 168 hours.   BNP  Recent Labs Lab 02/06/2017 1756  BNP 668.2*     DDimer No results for input(s): DDIMER in the last 168 hours.   Radiology    No results found.  Cardiac Studies   Echocardiogram 01/16/17 Study Conclusions  - Left ventricle: The cavity size was normal. Wall thickness was increased in a pattern of mild LVH.  Systolic function was normal. The estimated ejection fraction was in the range of 55% to 60%. Wall motion was normal; there were no regional wall motion abnormalities. The study is not technically sufficient to allow evaluation of LV diastolic function. - Aortic valve: Trileaflet. Sclerosis without stenosis. There was mild regurgitation. - Mitral valve: There was moderate regurgitation. - Left atrium: Severely dilated. - Right ventricle: The cavity size was moderately dilated. Systolic function is mildly reduced. - Tricuspid valve: There was moderate regurgitation. - Pulmonic valve: There was trivial regurgitation. - Pulmonary arteries: PA peak pressure: 52 mm Hg (S). - Inferior vena cava: The vessel was dilated. The respirophasic diameter changes were blunted (<50%), consistent with elevated central venous pressure.  Impressions: - Compared to a prior study in 09/2016, the LVEF is stable. There is severe LAE, moderate RAE, moderate RVE and mild RV systolic dysfunction. At least moderate MR and TR, RVSP 52 mmHg, dilated IVC - atrial fibrillation with RVR is present.  Patient Profile     83 y.o. male  with a hx of persistant atrial fibrillation with 7 cardioversions since 03/2014 and warfarin therapy, moderate MR, CAD status post stent placement, hyperlipidemia, GERD, hypothyroidism, OSA on CPAP, BPH, iron deficiency anemia, CK D stage III, depression and anxiety  who is being seen today for the evaluation of atrial fibrillation with rapid ventricular response at the request of Dr Thereasa Solo.  Assessment & Plan    1. Persistent atrial fibrillation Difficult situation as atrial fibrillation is poorly controlled. He has failed amiodarone and has had 15 cardioversions since 2015. His left atrium is severely dilatedit is unlikely that he Amayrany Cafaro remain in sinus rhythm. It is likely that his atrial fibrillation has become permanent and a rate control strategy would be best  used he is volume overloaded and thus diuresis Jceon Alverio help with rate control. He is on IV diltiazem, we'll start him on by mouth diltiazem as well which can be titrated up for rate control as the IV is titrated down. She  does not control his heart rate, he may benefit from AV nodal ablation with pacemaker implantation. That being said, it is possible that AV nodal ablation Eean Buss not improve his functional status only his heart rate.  This patients CHA2DS2-VASc Score and unadjusted Ischemic Stroke Rate (% per year) is equal to 4.8 % stroke rate/year from a score of 4  Above score calculated as 1 point each if present [CHF, HTN, DM, Vascular=MI/PAD/Aortic Plaque, Age if 65-74, or Male] Above score calculated as 2 points each if present [Age > 75, or Stroke/TIA/TE]  2. Pulmonary edema Has had good urine output with Lasix. He does continue to be short of breath with minimal exertion. It is likely that he has more diuresis to be done. Continue IV Lasix, his creatinine has remained stable.  3. Hypertension Blood pressure well controlled  4. CAD No current chest pain  5. OSA Continue CPAP  6. Hypothyroidism Per internal medicine  7. Hyper-lipidemia Goal LDL less than 70. Continue pravastatin  8. Mitral regurgitation Moderate on echo. Continue current management.  Signed, Chuckie Mccathern Meredith Leeds, MD  01/21/2017, 1:02 PM

## 2017-01-21 NOTE — Progress Notes (Signed)
[name]  DGU:[id number] DOB: 07-14-1927 DOA: 01/27/2017 PCP: Kevin Lor, MD    Brief Narrative:  83 y.o. male with history of Hypertension, hyperlipidemia, GERD, hypothyroidism, depression, anxiety, OSA on CPAP, A. fib on Coumadin, popliteal artery embolism, MV regurg, CAD s/p stent placement, BPH, iron deficiency anemia, and CKD3 who presented with confusion, cough, shortness breath, and nausea w/ some limited vomiting.  Pt was hospitalized for a R tibia/fib fracture 6/22-6/25. Patient had surgery, and was discharged to a rehab facility. Per his son he began to be confused and lethargic, w/ poor appetite and decreased oral intake, as well as oxygen desaturations to 60-70%.   ED w/u revealed WBC 14.9, sodium 128, AST 594, ALT 338, total bilirubin 1.8, tachycardia, and tachypnea. CXR showed interstitial opacity, worse on the left. .  Subjective: The patient is sitting up in a bedside chair.  He appears much more comfortable than he did yesterday in regard to his respirations.  He tells me he feels very tired.  He denies chest pain nausea or abdominal pain  Assessment & Plan:  Acute hypoxic respiratory failure - pulmonary edema  difficult to achieve volume balance in setting of RV failure/Pulm HTN/RVR - suspect MVR + RHF + Afib w/ RVR all contributing to these recurring episodes of flash pulmonary edema - cont to diurese - renal function stable presently  Parox Afib on coumadin CHA2DS2 VASc score is 4 - s/p TEE DCCV March 2018 - is intolerant to BBs per Cardiology outpt notes - evaluated by EP May 2018 at which time amio was noted to not be working for his afib - rate has proven difficult to control, and RVR appears to be playing a role in exacerbating his pulmonary edema - Cards following - on cardizem gtt as well as oral   R heart failure - Mitral valve regurg - Tricuspid valve regurg TTE notes severe LAE, mode RAE, moderate RVE, and mild RV  systolic dysfxn w/ moderate MR and TR - RVSP noted to be 52 - volume proving difficult to balance w/ pt swinging quickly from [name] - [name] to volume overload - cont scheduled diuretic for now - net negative ~5.8L since admit  Filed Weights   01/18/17 0443 01/19/17 0445 01/20/17 0543  Weight: 90.2 kg (198 lb 14.4 oz) 90.7 kg (200 lb) 81.6 kg (180 lb)    SIRS  I suspect his blood cx is a contaminant, and I doubt he has actually had an acute infection - following off abx   Transaminitis - shock liver v/s congestive hepatopathy  acute viral hepatitis panel negative - no evidence of acute gallbladder disease on ultrasound - has resolved   Acute metabolic encephalopathy no acute findings on CT head  - suspect this was related to dehydration and hyponatremia  - Z-56 and folic acid levels normal - now at baseline mental status   Hyponatremia Sodium has normalized - following w/ diuresis ongoing   Recent Labs Lab 01/17/17 0358 01/18/17 0446 01/19/17 0356 01/20/17 0222 01/21/17 0343  NA 136 134* 137 136 134*   Acute renal injury on CKD Stage 3 Baseline crt ~1.2 - suspect pre-renal azotemia - crt normalizing w/ hydration but now must diuresis - cont to follow trend w/ scheduled lasix   Recent Labs Lab 01/17/17 0358 01/18/17 0446 01/19/17 0356 01/20/17 0222 01/21/17 0343  CREATININE 1.27* 1.29* 1.24 1.18 1.19    Hypothyroidism continue home Synthroid but in setting of persistent tachycardia will  decrease dose/aim for less aggressive TSH goal   R closed Tib/Fib fx no evidence of acute complications at this time - continue PT/OT - due for Ortho f/u 7/10 - will ask [name] to see pt while he is in the hospital (non-urgent)  HLD  HTN Not an active problem at this time   CAD Asymptomatic presently - followed by [name] in the outpatient setting  GERD  OSA Home CPAP   DVT prophylaxis: warfarin Code Status: DNR - NO CODE Family Communication: Spoke with the patient's wife  at the bedside Disposition Plan: SDU  Consultants:  [name]   Procedures: none  Antimicrobials:  Zosyn 7/1 > 7/6 Vancomycin 7/1 > 7/6  Objective: Blood pressure 102/60, pulse (!) 110, temperature 98.4 F (36.9 C), temperature source Axillary, resp. rate (!) 26, height 5\' 11"  (1.803 m), weight 81.6 kg (180 lb), SpO2 92 %.  Intake/Output Summary (Last 24 hours) at 01/21/17 1415 Last data filed at 01/21/17 1000  Gross per 24 hour  Intake              570 ml  Output             2500 ml  Net            -1930 ml   Filed Weights   01/18/17 0443 01/19/17 0445 01/20/17 0543  Weight: 90.2 kg (198 lb 14.4 oz) 90.7 kg (200 lb) 81.6 kg (180 lb)    Examination: General: exhausted - no acute resp distress  Lungs: no active wheezing - mild diffuse crackles  Cardiovascular: irregularly irregular w/ RVR  Abdomen: Nontender, nondistended, soft, bowel sounds positive, no rebound Extremities: Right lower extremity in cast w/o change - left lower extremity with persistent 1+ edema  CBC:  Recent Labs Lab 01/15/2017 1756  01/17/17 0358 01/18/17 0446 01/19/17 0356 01/20/17 0222 01/21/17 0343  WBC 14.9*  < > 12.5* 12.3* 12.9* 13.2* 13.3*  NEUTROABS 12.3*  --   --   --   --   --   --   HGB 9.4*  < > 9.9* 9.8* 10.7* 10.4* 10.6*  HCT 27.4*  < > 29.5* 29.1* 32.2* 30.8* 32.0*  MCV 90.7  < > 92.5 91.2 92.5 91.1 91.4  PLT 244  < > 226 244 336 335 371  < > = values in this interval not displayed. Basic Metabolic Panel:  Recent Labs Lab 01/17/17 0358 01/18/17 0446 01/18/17 1415 01/18/17 1853 01/19/17 0356 01/20/17 0222 01/21/17 0343  NA 136 134*  --   --  137 136 134*  K 3.7 3.1* 3.0* 2.9* 3.7 3.8 4.5  CL 105 102  --   --  103 101 98*  CO2 23 24  --   --  24 25 26   GLUCOSE 117* 103*  --   --  117* 116* 113*  BUN 27* 22*  --   --  17 14 16   CREATININE 1.27* 1.29*  --   --  1.24 1.18 1.19  CALCIUM 8.1* 7.9*  --   --  8.3* 8.3* 8.5*  MG 1.9  --  1.7 2.2 2.2  --   --     GFR: Estimated Creatinine Clearance: 46.6 mL/min (by C-G formula based on SCr of 1.19 mg/dL).  Liver Function Tests:  Recent Labs Lab 01/20/2017 1756 01/15/17 1449 01/16/17 0253 01/21/17 0343  AST 594* 215* 147* 31  ALT 338* 271* 230* 60  ALKPHOS 136* 151* 148* 104  BILITOT 1.8* 1.8* 1.6*  1.3*  PROT 5.6* 6.6 5.4* 5.8*  ALBUMIN 2.4* 2.3* 2.1* 2.2*    Recent Labs Lab 01/17/2017 2029  LIPASE 19    Recent Labs Lab 02/09/2017 1952  AMMONIA <9*    Coagulation Profile:  Recent Labs Lab 01/17/17 0358 01/18/17 0446 01/19/17 0356 01/20/17 0222 01/21/17 0343  INR 2.75 3.04 3.08 2.92 2.65    Cardiac Enzymes:  Recent Labs Lab 01/28/2017 1756 01/20/2017 2029 01/15/17 0028 01/15/17 0732 01/16/17 0253  TROPONINI 0.04* 0.05* 0.04* 0.10* 0.08*    Recent Results (from the past 240 hour(s))  Urine Culture     Status: None   Collection Time: 01/30/2017  1:25 AM  Result Value Ref Range Status   Specimen Description URINE, RANDOM  Final   Special Requests NONE  Final   Culture NO GROWTH  Final   Report Status 01/16/2017 FINAL  Final  Blood Culture (routine x 2)     Status: Abnormal   Collection Time: 02/05/2017  5:36 PM  Result Value Ref Range Status   Specimen Description BLOOD LEFT FOREARM  Final   Special Requests IN PEDIATRIC BOTTLE Blood Culture adequate volume  Final   Culture  Setup Time   Final    GRAM POSITIVE COCCI IN CLUSTERS IN PEDIATRIC BOTTLE CRITICAL RESULT CALLED TO, READ BACK BY AND VERIFIED WITH: F. Fulton, AT [phone] 01/16/17 BY D. VANHOOK    Culture (A)  Final    STAPHYLOCOCCUS SPECIES (COAGULASE NEGATIVE) THE SIGNIFICANCE OF ISOLATING THIS ORGANISM FROM A SINGLE SET OF BLOOD CULTURES WHEN MULTIPLE SETS ARE DRAWN IS UNCERTAIN. PLEASE NOTIFY THE MICROBIOLOGY DEPARTMENT WITHIN ONE WEEK IF SPECIATION AND SENSITIVITIES ARE REQUIRED.    Report Status 01/18/2017 FINAL  Final  Blood Culture (routine x 2)     Status: None   Collection Time: 01/30/2017  5:56 PM   Result Value Ref Range Status   Specimen Description BLOOD LEFT WRIST  Final   Special Requests   Final    BOTTLES DRAWN AEROBIC AND ANAEROBIC Blood Culture adequate volume   Culture NO GROWTH 5 DAYS  Final   Report Status 01/19/2017 FINAL  Final  Respiratory Panel by PCR     Status: None   Collection Time: 01/15/17 12:42 AM  Result Value Ref Range Status   Adenovirus NOT DETECTED NOT DETECTED Final   Coronavirus 229E NOT DETECTED NOT DETECTED Final   Coronavirus HKU1 NOT DETECTED NOT DETECTED Final   Coronavirus NL63 NOT DETECTED NOT DETECTED Final   Coronavirus OC43 NOT DETECTED NOT DETECTED Final   Metapneumovirus NOT DETECTED NOT DETECTED Final   Rhinovirus / Enterovirus NOT DETECTED NOT DETECTED Final   Influenza A NOT DETECTED NOT DETECTED Final   Influenza B NOT DETECTED NOT DETECTED Final   Parainfluenza Virus 1 NOT DETECTED NOT DETECTED Final   Parainfluenza Virus 2 NOT DETECTED NOT DETECTED Final   Parainfluenza Virus 3 NOT DETECTED NOT DETECTED Final   Parainfluenza Virus 4 NOT DETECTED NOT DETECTED Final   Respiratory Syncytial Virus NOT DETECTED NOT DETECTED Final   Bordetella pertussis NOT DETECTED NOT DETECTED Final   Chlamydophila pneumoniae NOT DETECTED NOT DETECTED Final   Mycoplasma pneumoniae NOT DETECTED NOT DETECTED Final  MRSA PCR Screening     Status: None   Collection Time: 01/15/17 12:44 AM  Result Value Ref Range Status   MRSA by PCR NEGATIVE NEGATIVE Final    Comment:        The GeneXpert MRSA Assay (FDA approved for NASAL specimens only), is  one component of a comprehensive MRSA colonization surveillance program. It is not intended to diagnose MRSA infection nor to guide or monitor treatment for MRSA infections.      Scheduled Meds: . clonazePAM  1 mg Oral QHS  . diltiazem  90 mg Oral Q12H  . doxazosin  1 mg Oral Daily  . finasteride  5 mg Oral Daily  . furosemide  40 mg Intravenous Q12H  . levothyroxine  50 mcg Oral QAC breakfast  .  megestrol  800 mg Oral Daily  . multivitamin with minerals  1 tablet Oral Daily  . omega-3 acid ethyl esters  1 g Oral Daily  . pantoprazole  40 mg Oral BID  . potassium chloride  20 mEq Oral TID  . pravastatin  40 mg Oral QPM  . saccharomyces boulardii  250 mg Oral BID  . traZODone  25 mg Oral QHS  . Warfarin - Pharmacist Dosing Inpatient   Does not apply q1800     LOS: 7 days   Cherene Altes, MD [facility] Office  [phone] Pager - Text Page per Amion as per below:  On-Call/Text Page:      [email or web address]      password [email or web address]  If 7PM-7AM, please contact night-coverage [email or web address] Password [email or web address] 01/21/2017, 2:15 PM

## 2017-01-21 NOTE — Progress Notes (Signed)
Pt became increasingly confused over night, pulling oxygen off and desating and attempting to get out of bed without assistance. Non-medical interventions were attempted without success. The hospitalist was paged and an order for Ativan 1mg  was given. The pt appeared at ease and slept for a few hours but then woke up and pulled out his oxygen as well as his IV's out. The catheters were intact. The pt had blood on his sheets and near all the IV sites. The pt was cleaned, linens were changed, new IV was started and pt is now sleeping with cpap on. VS stable. Bed alarm on and call light in reach. Charge made aware of situation, as well as, the pt's oncoming nurse.

## 2017-01-21 NOTE — Progress Notes (Signed)
RN placed patient on CPAP for the night. Went to go check on patient and he had pulled mask off, SPO2 dropped to 73%. Placed patient back on CPAP and told patient that if he needed to come off to notify RN so we could place him on his oxygen so his sat did not drop. RT will continue to monitor, RN aware of events.

## 2017-01-21 NOTE — Progress Notes (Signed)
ANTICOAGULATION CONSULT NOTE - Follow up [name] for warfarin Indication: atrial fibrillation  Allergies  Allergen Reactions  . Metoprolol Tartrate Other (See Comments)    Anxiety, heart rate goes too low, feels like in a fog.    Patient Measurements: Height: 5\' 11"  (180.3 cm) Weight: 180 lb (81.6 kg) IBW/kg (Calculated) : 75.3  Vital Signs: Temp: 98.4 F (36.9 C) (07/08 0445) Temp Source: Axillary (07/08 0445) BP: 102/60 (07/08 1212) Pulse Rate: 110 (07/08 1212)  Labs:  Recent Labs  01/19/17 0356 01/20/17 0222 01/21/17 0343  HGB 10.7* 10.4* 10.6*  HCT 32.2* 30.8* 32.0*  PLT 336 335 371  LABPROT 32.5* 31.1* 28.8*  INR 3.08 2.92 2.65  CREATININE 1.24 1.18 1.19    Estimated Creatinine Clearance: 46.6 mL/min (by C-G formula based on SCr of 1.19 mg/dL).  Medications:  Warfarin PTA 2mg  daily  Assessment: 45 yoM on chronic coumadin for history of afib. INR was therapeutic upon admission 2.57. Missed dose on 7/1.  INR 3> 2.9>2.65 after lower dosing of 1mg  daily x3 days.  Patient with poor-to-moderate PO intake -  25-50% recorded.  Elevated LFTs resolving, CBC low stable  - no bleeding noted    Goal of Therapy:  INR 2-3 Monitor platelets by anticoagulation protocol: Yes   Plan:  -Warfarin 2mg  po x1 tonight -Watch INR trend closely as poor po intake.  -Daily INR, CBC -Monitor S/Sx bleeding, PO intake, DDI, clinical picture  [name], PharmD, BCPS Clinical Pharmacist Clinical Phone 01/21/2017 until 3:30 PM -#[phone] After hours, please call #[phone] 01/21/2017 2:09 PM

## 2017-01-22 ENCOUNTER — Telehealth (INDEPENDENT_AMBULATORY_CARE_PROVIDER_SITE_OTHER): Payer: Self-pay | Admitting: Orthopaedic Surgery

## 2017-01-22 ENCOUNTER — Telehealth (INDEPENDENT_AMBULATORY_CARE_PROVIDER_SITE_OTHER): Payer: Self-pay

## 2017-01-22 ENCOUNTER — Inpatient Hospital Stay (HOSPITAL_COMMUNITY): Payer: Medicare Other

## 2017-01-22 LAB — BASIC METABOLIC PANEL
Anion gap: 7 (ref 5–15)
BUN: 19 mg/dL (ref 6–20)
CHLORIDE: 100 mmol/L — AB (ref 101–111)
CO2: 26 mmol/L (ref 22–32)
CREATININE: 1.27 mg/dL — AB (ref 0.61–1.24)
Calcium: 8.4 mg/dL — ABNORMAL LOW (ref 8.9–10.3)
GFR calc non Af Amer: 49 mL/min — ABNORMAL LOW (ref >60)
GFR, EST AFRICAN AMERICAN: 57 mL/min — AB (ref >60)
GLUCOSE: 137 mg/dL — AB (ref 65–99)
Potassium: 5.2 mmol/L — ABNORMAL HIGH (ref 3.5–5.1)
Sodium: 133 mmol/L — ABNORMAL LOW (ref 135–145)

## 2017-01-22 LAB — BRAIN NATRIURETIC PEPTIDE: B Natriuretic Peptide: 384.9 pg/mL — ABNORMAL HIGH (ref 0.0–100.0)

## 2017-01-22 LAB — PROTIME-INR
INR: 2.82
Prothrombin Time: 30.3 seconds — ABNORMAL HIGH (ref 11.4–15.2)

## 2017-01-22 MED ORDER — WARFARIN SODIUM 1 MG PO TABS
1.0000 mg | ORAL_TABLET | ORAL | Status: DC
  Administered 2017-01-22: 1 mg via ORAL
  Filled 2017-01-22: qty 1

## 2017-01-22 MED ORDER — WARFARIN SODIUM 2 MG PO TABS: 2.0000 mg | ORAL_TABLET | ORAL | Status: DC

## 2017-01-22 MED ORDER — DILTIAZEM HCL ER 60 MG PO CP12
120.0000 mg | ORAL_CAPSULE | Freq: Two times a day (BID) | ORAL | Status: DC
  Administered 2017-01-22 – 2017-01-24 (×6): 120 mg via ORAL
  Filled 2017-01-22 (×8): qty 2

## 2017-01-22 NOTE — Progress Notes (Signed)
Patient ID: [name], male   DOB: 1927-04-21, 83 y.o.   MRN: [id number] The patient is well-known to me.  He is just over 2 weeks post fixation of a right ankle fracture/dislocation.  I took off his splint at the bedside and his incisions look good.  I will remove the sutures tomorrow and place steri-strips.  I will also a walking boot to have at the bedside and to put on only when up.  He is to remain only touch-down weight on his right ankle in the boot for the next 2-4 weeks as he heals this injury.

## 2017-01-22 NOTE — Progress Notes (Signed)
[name]  YWV:[id number] DOB: 1927/01/27 DOA: 01/28/2017 PCP: Marletta Lor, MD    Brief Narrative:  84 y.o. male with history of Hypertension, hyperlipidemia, GERD, hypothyroidism, depression, anxiety, OSA on CPAP, A. fib on Coumadin, popliteal artery embolism, MV regurg, CAD s/p stent placement, BPH, iron deficiency anemia, and CKD3 who presented with confusion, cough, shortness breath, and nausea w/ some limited vomiting.  Pt was hospitalized for a R tibia/fib fracture 6/22-6/25. Patient had surgery, and was discharged to a rehab facility. Per his son he began to be confused and lethargic, w/ poor appetite and decreased oral intake, as well as oxygen desaturations to 60-70%.   ED w/u revealed WBC 14.9, sodium 128, AST 594, ALT 338, total bilirubin 1.8, tachycardia, and tachypnea. CXR showed interstitial opacity, worse on the left. .  Subjective: The patient is resting comfortably in bed with no evidence of respiratory distress.  His blood pressure has been trending down and is presently in the mid 80s.  His heart rate is very well controlled at 84-90 bpm.  Assessment & Plan:  Acute hypoxic respiratory failure - pulmonary edema  difficult to achieve volume balance in setting of RV failure/Pulm HTN/RVR - suspect MVR + RHF + Afib w/ RVR all contributing to these recurring episodes of flash pulmonary edema - with climbing creatinine and modest hypotension we'll have to hold further diuresis today  Persitent Afib on coumadin CHA2DS2 VASc score is 4 - s/p TEE DCCV March 2018 - is intolerant to BBs per Cardiology outpt notes - evaluated by EP May 2018 at which time amio was noted to not be working for his afib - rate has proven difficult to control, and RVR appears to be playing a role in exacerbating his pulmonary edema - Cards following - rate well controlled at the present time - Cardizem drip being discontinued this morning by cardiology  R heart  failure - Mitral valve regurg - Tricuspid valve regurg TTE notes severe LAE, mode RAE, moderate RVE, and mild RV systolic dysfxn w/ moderate MR and TR - RVSP noted to be 52 - volume proving difficult to balance w/ pt swinging quickly from [facility] to volume overload - net negative ~6.8L since admit  Filed Weights   01/19/17 0445 01/20/17 0543 01/22/17 0415  Weight: 90.7 kg (200 lb) 81.6 kg (180 lb) 79.4 kg (175 lb)    SIRS  I suspect his blood cx is a contaminant, and I doubt he has actually had an acute infection - following off abx   Transaminitis - shock liver v/s congestive hepatopathy  acute viral hepatitis panel negative - no evidence of acute gallbladder disease on ultrasound - has resolved   Acute metabolic encephalopathy no acute findings on CT head  - suspect this was related to dehydration and hyponatremia  - W-54 and folic acid levels normal - now at baseline mental status   Hyponatremia Sodium Fluctuating but remains within a 133-137 range - follow  Recent Labs Lab 01/18/17 0446 01/19/17 0356 01/20/17 0222 01/21/17 0343 01/22/17 0306  NA 134* 137 136 134* 133*   Acute renal injury on CKD Stage 3 Baseline crt ~1.2 - Creatinine beginning to climb again with diuresis - hold diuretic today  Recent Labs Lab 01/18/17 0446 01/19/17 0356 01/20/17 0222 01/21/17 0343 01/22/17 0306  CREATININE 1.29* 1.24 1.18 1.19 1.27*    Hypothyroidism continue home Synthroid but in setting of persistent tachycardia will decrease dose/aim for less aggressive TSH  goal   R closed Tib/Fib fx no evidence of acute complications at this time - continue PT/OT - due for Ortho f/u 7/10 - will ask Dr. Rush Farmer to see pt while he is in the hospital (non-urgent)  HLD  HTN Not an active problem at this time   CAD Asymptomatic presently - followed by Dr. Percival Spanish in the outpatient setting  GERD  OSA Home CPAP   DVT prophylaxis: warfarin Code Status: DNR - NO CODE Family Communication:  No family present at time of exam today Disposition Plan: SDU  Consultants:  [facility]   Procedures: none  Antimicrobials:  Zosyn 7/1 > 7/6 Vancomycin 7/1 > 7/6  Objective: Blood pressure (!) 87/64, pulse (!) 109, temperature 97.9 F (36.6 C), temperature source Oral, resp. rate (!) 21, height 5\' 11"  (1.803 m), weight 79.4 kg (175 lb), SpO2 92 %.  Intake/Output Summary (Last 24 hours) at 01/22/17 0953 Last data filed at 01/22/17 0900  Gross per 24 hour  Intake              689 ml  Output             1600 ml  Net             -911 ml   Filed Weights   01/19/17 0445 01/20/17 0543 01/22/17 0415  Weight: 90.7 kg (200 lb) 81.6 kg (180 lb) 79.4 kg (175 lb)    Examination: General: Resting comfortably in bed Lungs: Clear to auscultation throughout with no wheezing  Cardiovascular: Irregularly irregular with controlled rate at 85 bpm Abdomen: Nontender, nondistended, soft, bowel sounds positive, no rebound Extremities: Right lower extremity in cast w/o change - left lower extremity without edema  CBC:  Recent Labs Lab 01/17/17 0358 01/18/17 0446 01/19/17 0356 01/20/17 0222 01/21/17 0343  WBC 12.5* 12.3* 12.9* 13.2* 13.3*  HGB 9.9* 9.8* 10.7* 10.4* 10.6*  HCT 29.5* 29.1* 32.2* 30.8* 32.0*  MCV 92.5 91.2 92.5 91.1 91.4  PLT 226 244 336 335 193   Basic Metabolic Panel:  Recent Labs Lab 01/17/17 0358 01/18/17 0446 01/18/17 1415 01/18/17 1853 01/19/17 0356 01/20/17 0222 01/21/17 0343 01/22/17 0306  NA 136 134*  --   --  137 136 134* 133*  K 3.7 3.1* 3.0* 2.9* 3.7 3.8 4.5 5.2*  CL 105 102  --   --  103 101 98* 100*  CO2 23 24  --   --  24 25 26 26   GLUCOSE 117* 103*  --   --  117* 116* 113* 137*  BUN 27* 22*  --   --  17 14 16 19   CREATININE 1.27* 1.29*  --   --  1.24 1.18 1.19 1.27*  CALCIUM 8.1* 7.9*  --   --  8.3* 8.3* 8.5* 8.4*  MG 1.9  --  1.7 2.2 2.2  --   --   --    GFR: Estimated Creatinine Clearance: 43.6 mL/min (A) (by C-G formula based on  SCr of 1.27 mg/dL (H)).  Liver Function Tests:  Recent Labs Lab 01/15/17 1449 01/16/17 0253 01/21/17 0343  AST 215* 147* 31  ALT 271* 230* 60  ALKPHOS 151* 148* 104  BILITOT 1.8* 1.6* 1.3*  PROT 6.6 5.4* 5.8*  ALBUMIN 2.3* 2.1* 2.2*    Coagulation Profile:  Recent Labs Lab 01/18/17 0446 01/19/17 0356 01/20/17 0222 01/21/17 0343 01/22/17 0306  INR 3.04 3.08 2.92 2.65 2.82    Cardiac Enzymes:  Recent Labs Lab 01/16/17 0253  TROPONINI 0.08*  Recent Results (from the past 240 hour(s))  Urine Culture     Status: None   Collection Time: 02/10/2017  1:25 AM  Result Value Ref Range Status   Specimen Description URINE, RANDOM  Final   Special Requests NONE  Final   Culture NO GROWTH  Final   Report Status 01/16/2017 FINAL  Final  Blood Culture (routine x 2)     Status: Abnormal   Collection Time: 02/10/2017  5:36 PM  Result Value Ref Range Status   Specimen Description BLOOD LEFT FOREARM  Final   Special Requests IN PEDIATRIC BOTTLE Blood Culture adequate volume  Final   Culture  Setup Time   Final    GRAM POSITIVE COCCI IN CLUSTERS IN PEDIATRIC BOTTLE CRITICAL RESULT CALLED TO, READ BACK BY AND VERIFIED WITH: F. [place], AT [phone] 01/16/17 BY D. VANHOOK    Culture (A)  Final    STAPHYLOCOCCUS SPECIES (COAGULASE NEGATIVE) THE SIGNIFICANCE OF ISOLATING THIS ORGANISM FROM A SINGLE SET OF BLOOD CULTURES WHEN MULTIPLE SETS ARE DRAWN IS UNCERTAIN. PLEASE NOTIFY THE MICROBIOLOGY DEPARTMENT WITHIN ONE WEEK IF SPECIATION AND SENSITIVITIES ARE REQUIRED.    Report Status 01/18/2017 FINAL  Final  Blood Culture (routine x 2)     Status: None   Collection Time: 01/15/2017  5:56 PM  Result Value Ref Range Status   Specimen Description BLOOD LEFT WRIST  Final   Special Requests   Final    BOTTLES DRAWN AEROBIC AND ANAEROBIC Blood Culture adequate volume   Culture NO GROWTH 5 DAYS  Final   Report Status 01/19/2017 FINAL  Final  Respiratory Panel by PCR     Status: None    Collection Time: 01/15/17 12:42 AM  Result Value Ref Range Status   Adenovirus NOT DETECTED NOT DETECTED Final   Coronavirus 229E NOT DETECTED NOT DETECTED Final   Coronavirus HKU1 NOT DETECTED NOT DETECTED Final   Coronavirus NL63 NOT DETECTED NOT DETECTED Final   Coronavirus OC43 NOT DETECTED NOT DETECTED Final   Metapneumovirus NOT DETECTED NOT DETECTED Final   Rhinovirus / Enterovirus NOT DETECTED NOT DETECTED Final   Influenza A NOT DETECTED NOT DETECTED Final   Influenza B NOT DETECTED NOT DETECTED Final   Parainfluenza Virus 1 NOT DETECTED NOT DETECTED Final   Parainfluenza Virus 2 NOT DETECTED NOT DETECTED Final   Parainfluenza Virus 3 NOT DETECTED NOT DETECTED Final   Parainfluenza Virus 4 NOT DETECTED NOT DETECTED Final   Respiratory Syncytial Virus NOT DETECTED NOT DETECTED Final   Bordetella pertussis NOT DETECTED NOT DETECTED Final   Chlamydophila pneumoniae NOT DETECTED NOT DETECTED Final   Mycoplasma pneumoniae NOT DETECTED NOT DETECTED Final  MRSA PCR Screening     Status: None   Collection Time: 01/15/17 12:44 AM  Result Value Ref Range Status   MRSA by PCR NEGATIVE NEGATIVE Final    Comment:        The GeneXpert MRSA Assay (FDA approved for NASAL specimens only), is one component of a comprehensive MRSA colonization surveillance program. It is not intended to diagnose MRSA infection nor to guide or monitor treatment for MRSA infections.      Scheduled Meds: . clonazePAM  1 mg Oral QHS  . diltiazem  90 mg Oral Q12H  . finasteride  5 mg Oral Daily  . furosemide  40 mg Intravenous Q12H  . levothyroxine  50 mcg Oral QAC breakfast  . megestrol  800 mg Oral Daily  . multivitamin with minerals  1 tablet Oral  Daily  . omega-3 acid ethyl esters  1 g Oral Daily  . pantoprazole  40 mg Oral BID  . potassium chloride  20 mEq Oral TID  . pravastatin  40 mg Oral QPM  . saccharomyces boulardii  250 mg Oral BID  . traZODone  25 mg Oral QHS  . Warfarin - Pharmacist  Dosing Inpatient   Does not apply q1800     LOS: 8 days   Cherene Altes, MD [facility] Office  [phone] Pager - Text Page per Amion as per below:  On-Call/Text Page:      [email or web address]      password [email or web address]  If 7PM-7AM, please contact night-coverage [email or web address] Password [email or web address] 01/22/2017, 9:53 AM

## 2017-01-22 NOTE — Telephone Encounter (Signed)
I already got a call about him from the admitting physician and will see him this evening

## 2017-01-22 NOTE — Telephone Encounter (Signed)
LMOM for patient son of the below message

## 2017-01-22 NOTE — Telephone Encounter (Signed)
Rec'd called from patient son Louie Casa advised him of message from Dr. Ninfa Linden. Louie Casa said they are still waiting to take x-rays. [phone]

## 2017-01-22 NOTE — Telephone Encounter (Signed)
See below

## 2017-01-22 NOTE — Care Management Important Message (Signed)
Important Message  Patient Details  Name: [name] MRN: [id number] Date of Birth: 1927/05/06   Medicare Important Message Given:  Yes    [name] 01/22/2017, 2:50 PM

## 2017-01-22 NOTE — Progress Notes (Signed)
ANTICOAGULATION CONSULT NOTE - Follow Up Consult  Pharmacy Consult for Coumadin Indication: atrial fibrillation  Allergies  Allergen Reactions  . Metoprolol Tartrate Other (See Comments)    Anxiety, heart rate goes too low, feels like in a fog.    Patient Measurements: Height: 5\' 11"  (180.3 cm) Weight: 175 lb (79.4 kg) IBW/kg (Calculated) : 75.3 Heparin Dosing Weight:   Vital Signs: Temp: 98.3 F (36.8 C) (07/09 1112) Temp Source: Axillary (07/09 1112) BP: 94/60 (07/09 1112) Pulse Rate: 91 (07/09 1112)  Labs:  Recent Labs  01/20/17 0222 01/21/17 0343 01/22/17 0306  HGB 10.4* 10.6*  --   HCT 30.8* 32.0*  --   PLT 335 371  --   LABPROT 31.1* 28.8* 30.3*  INR 2.92 2.65 2.82  CREATININE 1.18 1.19 1.27*    Estimated Creatinine Clearance: 43.6 mL/min (A) (by C-G formula based on SCr of 1.27 mg/dL (H)).   Assessment: Anticoag: Warfarin for hx afib - INR therapeutic at 2.82 but trending down from 3 on 1mg  last 3 days Hgb low stable, pltc stable, LFTs normalized.  - (PTA dose 2mg  daily) -Recent tibia/fibula repair 6/22-6/25  Goal of Therapy:  INR 2-3 Monitor platelets by anticoagulation protocol: Yes   Plan:  - watch K  -Warfarin 1mg  alternating with 2mg  daily -Daily INR, CBC   [name], PharmD, BCPS Clinical Staff Pharmacist Pager [phone]  [name] 01/22/2017,12:43 PM

## 2017-01-22 NOTE — Telephone Encounter (Signed)
Patient's son called concerning his father being seen in the hospital by Dr. Ninfa Linden.  Patient had surgery on January 06, 2017 by Dr. Ninfa Linden.  Would like to know if Dr. Ninfa Linden will be coming by.  Cb# is [phone].  Please advise.  Thank You.

## 2017-01-22 NOTE — Progress Notes (Addendum)
Progress Note  Patient Name: [name] Date of Encounter: 01/22/2017  Primary Cardiologist: Drs. Hochrein/Camnitz  Subjective   Still complains of exertional fatigue and DOE.  No CP.  Inpatient Medications    Scheduled Meds: . clonazePAM  1 mg Oral QHS  . diltiazem  90 mg Oral Q12H  . doxazosin  1 mg Oral Daily  . finasteride  5 mg Oral Daily  . furosemide  40 mg Intravenous Q12H  . levothyroxine  50 mcg Oral QAC breakfast  . megestrol  800 mg Oral Daily  . multivitamin with minerals  1 tablet Oral Daily  . omega-3 acid ethyl esters  1 g Oral Daily  . pantoprazole  40 mg Oral BID  . potassium chloride  20 mEq Oral TID  . pravastatin  40 mg Oral QPM  . saccharomyces boulardii  250 mg Oral BID  . traZODone  25 mg Oral QHS  . Warfarin - Pharmacist Dosing Inpatient   Does not apply q1800   Continuous Infusions: . sodium chloride 10 mL/hr at 01/21/17 0742  . diltiazem (CARDIZEM) infusion 5 mg/hr (01/22/17 0641)   PRN Meds: acetaminophen, dextromethorphan-guaiFENesin, hydroxypropyl methylcellulose / hypromellose, levalbuterol, LORazepam, morphine injection, nitroGLYCERIN, oxyCODONE   Vital Signs    Vitals:   01/22/17 0500 01/22/17 0600 01/22/17 0732 01/22/17 0739  BP: 104/62 (!) 86/61 (!) 87/64   Pulse: 98  (!) 109   Resp: (!) 23  (!) 21   Temp:      TempSrc:      SpO2: 95%  93% 92%  Weight:      Height:        Intake/Output Summary (Last 24 hours) at 01/22/17 0924 Last data filed at 01/22/17 0600  Gross per 24 hour  Intake              569 ml  Output             1600 ml  Net            -1031 ml   Filed Weights   01/19/17 0445 01/20/17 0543 01/22/17 0415  Weight: 200 lb (90.7 kg) 180 lb (81.6 kg) 175 lb (79.4 kg)    Telemetry    Atrial fibrillation with RVR - Personally Reviewed  ECG    Atrial fibrillation with RVR- Personally Reviewed  Physical Exam   GEN: No acute distress.   Neck: No JVD Cardiac: irreguarly irregular, no murmurs, rubs, or  gallops.  Respiratory: crackles at bases L>R GI: Soft, nontender, non-distended  MS: No edema Neuro:  Nonfocal  Psych: Normal affect   Labs    Chemistry Recent Labs Lab 01/15/17 1449 01/16/17 0253  01/20/17 0222 01/21/17 0343 01/22/17 0306  NA 132* 133*  < > 136 134* 133*  K 3.8 3.6  < > 3.8 4.5 5.2*  CL 101 103  < > 101 98* 100*  CO2 18* 22  < > 25 26 26   GLUCOSE 147* 139*  < > 116* 113* 137*  BUN 37* 38*  < > 14 16 19   CREATININE 1.81* 1.59*  < > 1.18 1.19 1.27*  CALCIUM 7.9* 8.0*  < > 8.3* 8.5* 8.4*  PROT 6.6 5.4*  --   --  5.8*  --   ALBUMIN 2.3* 2.1*  --   --  2.2*  --   AST 215* 147*  --   --  31  --   ALT 271* 230*  --   --  60  --  ALKPHOS 151* 148*  --   --  104  --   BILITOT 1.8* 1.6*  --   --  1.3*  --   GFRNONAA 32* 37*  < > 54* 53* 49*  GFRAA 37* 43*  < > >60 >60 57*  ANIONGAP 13 8  < > 10 10 7   < > = values in this interval not displayed.   Hematology Recent Labs Lab 01/19/17 0356 01/20/17 0222 01/21/17 0343  WBC 12.9* 13.2* 13.3*  RBC 3.48* 3.38* 3.50*  HGB 10.7* 10.4* 10.6*  HCT 32.2* 30.8* 32.0*  MCV 92.5 91.1 91.4  MCH 30.7 30.8 30.3  MCHC 33.2 33.8 33.1  RDW 14.7 14.5 14.5  PLT 336 335 371    Cardiac Enzymes Recent Labs Lab 01/16/17 0253  TROPONINI 0.08*   No results for input(s): TROPIPOC in the last 168 hours.   BNPNo results for input(s): BNP, PROBNP in the last 168 hours.   DDimer No results for input(s): DDIMER in the last 168 hours.   Radiology    No results found.  Cardiac Studies   with a hx of persistant atrial fibrillation with 7 cardioversions since 03/2014 and warfarin therapy, moderate MR, CAD status post stent placement, hyperlipidemia, GERD, hypothyroidism, OSA on CPAP, BPH, iron deficiency anemia, CK D stage III, depression and anxiety who is being seen today for the evaluation of atrial fibrillation with rapid ventricular responseat the request of Dr Thereasa Solo.  Patient Profile     83 y.o. male with a hx of  persistant atrial fibrillation with 7 cardioversions since 03/2014 and warfarin therapy, moderate MR, CAD status post stent placement, hyperlipidemia, GERD, hypothyroidism, OSA on CPAP, BPH, iron deficiency anemia, CK D stage III, depression and anxiety who is being seen for the evaluation of atrial fibrillation with rapid ventricular responseat the request of Dr Thereasa Solo.  Assessment & Plan    1. Persistent atrial fibrillation  He has failed amiodarone and has had 15 cardioversions since 2015. His left atrium is severely dilatedit is unlikely that he will remain in sinus rhythm. It is likely that his atrial fibrillation has become permanent and a rate control strategy would be best.  - he may benefit from AV nodal ablation with pacemaker implantation. That being said, it is possible that AV nodal ablation will not improve his functional status only his heart rate. - will continue warfarin for CHADSVASC2 score of 4.  INR therapeutic at 2.82. - HR controlled at rest but still has some swings upward into the low 100's.   BP too soft for titration of Cardizem further.  Still on Diltiazem ER 90mg  BID and IV Cardizem gtt.  Will stop IV Cardizem (5mg /hr) and increase Diltiazem ER to 120mg  BID. - if HR goes up then will add Amio 200mg  BID for better rate control.    2. Acute on chronic diastolic CHF - he has put out 1.6L since yesterday and is net neg 7L.   - his creatinine had slight bump today (1.18>>1.19>>1.27). - he has significant crackles on exam today and appears volume overloaded.   - continue Lasix - repeat BNP and CXRAY - BMET in am  3. Hypertension Blood pressure soft and therefore will stop doxazosin as he needs enough BP to titrate meds for HR control.  4. CAD No current chest pain - continue statin - no ASA due to warfarin and advanced age.   5. OSA Continue CPAP  6. Hypothyroidism Per internal medicine  7. Hyper-lipidemia Goal LDL less  than 70.  - continue  pravastatin  8. Mitral regurgitation Moderate on echo. Continue current management.  I have spent a total of 35 minutes with patient reviewing hospital notes , telemetry, EKGs, labs and examining patient as well as establishing an assessment and plan that was discussed with the patient.  > 50% of time was spent in direct patient care.    Signed, Fransico Him, MD  01/22/2017, 9:24 AM

## 2017-01-23 ENCOUNTER — Inpatient Hospital Stay (HOSPITAL_COMMUNITY): Payer: Medicare Other

## 2017-01-23 ENCOUNTER — Inpatient Hospital Stay (INDEPENDENT_AMBULATORY_CARE_PROVIDER_SITE_OTHER): Payer: Medicare Other | Admitting: Orthopaedic Surgery

## 2017-01-23 DIAGNOSIS — R0602 Shortness of breath: Secondary | ICD-10-CM

## 2017-01-23 DIAGNOSIS — T148XXA Other injury of unspecified body region, initial encounter: Secondary | ICD-10-CM

## 2017-01-23 DIAGNOSIS — I482 Chronic atrial fibrillation: Secondary | ICD-10-CM

## 2017-01-23 LAB — CBC
HCT: 28.3 % — ABNORMAL LOW (ref 39.0–52.0)
HEMOGLOBIN: 9.5 g/dL — AB (ref 13.0–17.0)
MCH: 30.7 pg (ref 26.0–34.0)
MCHC: 33.6 g/dL (ref 30.0–36.0)
MCV: 91.6 fL (ref 78.0–100.0)
Platelets: 412 10*3/uL — ABNORMAL HIGH (ref 150–400)
RBC: 3.09 MIL/uL — AB (ref 4.22–5.81)
RDW: 14.6 % (ref 11.5–15.5)
WBC: 14 10*3/uL — ABNORMAL HIGH (ref 4.0–10.5)

## 2017-01-23 LAB — PROTIME-INR
INR: 3.07
PROTHROMBIN TIME: 32.4 s — AB (ref 11.4–15.2)

## 2017-01-23 LAB — BASIC METABOLIC PANEL
ANION GAP: 9 (ref 5–15)
BUN: 28 mg/dL — ABNORMAL HIGH (ref 6–20)
CALCIUM: 8.4 mg/dL — AB (ref 8.9–10.3)
CO2: 24 mmol/L (ref 22–32)
Chloride: 99 mmol/L — ABNORMAL LOW (ref 101–111)
Creatinine, Ser: 1.37 mg/dL — ABNORMAL HIGH (ref 0.61–1.24)
GFR, EST AFRICAN AMERICAN: 52 mL/min — AB (ref >60)
GFR, EST NON AFRICAN AMERICAN: 45 mL/min — AB (ref >60)
Glucose, Bld: 114 mg/dL — ABNORMAL HIGH (ref 65–99)
Potassium: 4.8 mmol/L (ref 3.5–5.1)
SODIUM: 132 mmol/L — AB (ref 135–145)

## 2017-01-23 MED ORDER — PIPERACILLIN-TAZOBACTAM 3.375 G IVPB
3.3750 g | Freq: Three times a day (TID) | INTRAVENOUS | Status: DC
  Administered 2017-01-23 – 2017-01-25 (×6): 3.375 g via INTRAVENOUS
  Filled 2017-01-23 (×7): qty 50

## 2017-01-23 MED ORDER — SODIUM CHLORIDE 0.9% FLUSH
10.0000 mL | Freq: Two times a day (BID) | INTRAVENOUS | Status: DC
  Administered 2017-01-23 – 2017-01-24 (×3): 10 mL

## 2017-01-23 MED ORDER — FUROSEMIDE 10 MG/ML IJ SOLN: 40.0000 mg | Freq: Once | INTRAMUSCULAR | Status: DC

## 2017-01-23 MED ORDER — MORPHINE SULFATE (PF) 2 MG/ML IV SOLN
1.0000 mg | INTRAVENOUS | Status: DC | PRN
  Administered 2017-01-23 – 2017-01-24 (×2): 1 mg via INTRAVENOUS
  Filled 2017-01-23 (×2): qty 1

## 2017-01-23 MED ORDER — SODIUM CHLORIDE 0.9% FLUSH
10.0000 mL | INTRAVENOUS | Status: DC | PRN
Start: 2017-01-23 — End: 2017-01-25

## 2017-01-23 NOTE — Progress Notes (Signed)
Peripherally Inserted Central Catheter/Midline Placement  The IV Nurse has discussed with the patient and/or persons authorized to consent for the patient, the purpose of this procedure and the potential benefits and risks involved with this procedure.  The benefits include less needle sticks, lab draws from the catheter, and the patient may be discharged home with the catheter. Risks include, but not limited to, infection, bleeding, blood clot (thrombus formation), and puncture of an artery; nerve damage and irregular heartbeat and possibility to perform a PICC exchange if needed/ordered by physician.  Alternatives to this procedure were also discussed.  Bard Power PICC patient education guide, fact sheet on infection prevention and patient information card has been provided to patient /or left at bedside.  Consent signed by wife due to altered mental status.   PICC/Midline Placement Documentation        [name], [name] 01/23/2017, 5:18 PM

## 2017-01-23 NOTE — Progress Notes (Addendum)
Progress Note  Patient Name: [name] Date of Encounter: 01/23/2017  Primary Cardiologist: Drs. Hochrein/Camnitz  Subjective  Still having problems with SOB.  Son at bedside  Inpatient Medications    Scheduled Meds: . clonazePAM  1 mg Oral QHS  . diltiazem  120 mg Oral Q12H  . finasteride  5 mg Oral Daily  . levothyroxine  50 mcg Oral QAC breakfast  . megestrol  800 mg Oral Daily  . multivitamin with minerals  1 tablet Oral Daily  . omega-3 acid ethyl esters  1 g Oral Daily  . pantoprazole  40 mg Oral BID  . pravastatin  40 mg Oral QPM  . saccharomyces boulardii  250 mg Oral BID  . traZODone  25 mg Oral QHS  . warfarin  1 mg Oral Q48H  . warfarin  2 mg Oral Q48H  . Warfarin - Pharmacist Dosing Inpatient   Does not apply q1800   Continuous Infusions: . sodium chloride 10 mL/hr at 01/21/17 0742   PRN Meds: acetaminophen, dextromethorphan-guaiFENesin, hydroxypropyl methylcellulose / hypromellose, levalbuterol, morphine injection, nitroGLYCERIN, oxyCODONE   Vital Signs    Vitals:   01/22/17 2300 01/23/17 0026 01/23/17 0350 01/23/17 0742  BP: 95/65 106/66 92/62   Pulse:  (!) 102 98 97  Resp:  20 (!) 22 (!) 21  Temp:   98 F (36.7 C) 98 F (36.7 C)  TempSrc:   Axillary Axillary  SpO2:  93% 99% 98%  Weight:   175 lb (79.4 kg)   Height:        Intake/Output Summary (Last 24 hours) at 01/23/17 1010 Last data filed at 01/23/17 0900  Gross per 24 hour  Intake              830 ml  Output              850 ml  Net              -20 ml   Filed Weights   01/20/17 0543 01/22/17 0415 01/23/17 0350  Weight: 180 lb (81.6 kg) 175 lb (79.4 kg) 175 lb (79.4 kg)    Telemetry    Atrial fibrillation with CVR - Personally Reviewed  ECG    No new EKG to review- Personally Reviewed  Physical Exam   GEN: frail elderly male in NAD Neck: no bruit Cardiac: irregularly irregular with no M/R/G Respiratory: bilateral crackles L>R GI: soft, NT, ND MS: no edema Neuro:  nonfocal Psych: normal affect   Labs    Chemistry  Recent Labs Lab 01/21/17 0343 01/22/17 0306 01/23/17 0307  NA 134* 133* 132*  K 4.5 5.2* 4.8  CL 98* 100* 99*  CO2 26 26 24   GLUCOSE 113* 137* 114*  BUN 16 19 28*  CREATININE 1.19 1.27* 1.37*  CALCIUM 8.5* 8.4* 8.4*  PROT 5.8*  --   --   ALBUMIN 2.2*  --   --   AST 31  --   --   ALT 60  --   --   ALKPHOS 104  --   --   BILITOT 1.3*  --   --   GFRNONAA 53* 49* 45*  GFRAA >60 57* 52*  ANIONGAP 10 7 9      Hematology  Recent Labs Lab 01/20/17 0222 01/21/17 0343 01/23/17 0307  WBC 13.2* 13.3* 14.0*  RBC 3.38* 3.50* 3.09*  HGB 10.4* 10.6* 9.5*  HCT 30.8* 32.0* 28.3*  MCV 91.1 91.4 91.6  MCH 30.8 30.3 30.7  MCHC 33.8 33.1 33.6  RDW 14.5 14.5 14.6  PLT 335 371 412*    Cardiac EnzymesNo results for input(s): TROPONINI in the last 168 hours. No results for input(s): TROPIPOC in the last 168 hours.   BNP  Recent Labs Lab 01/22/17 1138  BNP 384.9*     DDimer No results for input(s): DDIMER in the last 168 hours.   Radiology    Dg Chest 2 View  Result Date: 01/22/2017 CLINICAL DATA:  CHF, shortness of Breath EXAM: CHEST  2 VIEW COMPARISON:  01/19/2017 FINDINGS: Stable cardiomegaly and diffuse interstitial opacities with superimposed airspace opacities in the upper lobes. No change since prior study. No effusions. IMPRESSION: No significant change diffuse interstitial and bilateral upper lobe airspace opacities. This could represent edema or infection. Electronically Signed   By: Rolm Baptise M.D.   On: 01/22/2017 10:44   Dg Ankle Right Port  Result Date: 01/22/2017 CLINICAL DATA:  Post op ORIF x 3 weeks ago and pt still having lots of pain EXAM: PORTABLE RIGHT ANKLE - 2 VIEW COMPARISON:  01/06/2017 and previous FINDINGS: Cast material obscures fine detail. Stable plate and screw fixation of the distal fibular shaft, and cannulated screw fixation of the medial malleolus, hardware intact without surrounding  lucency. Fracture fragments in near anatomic alignment. No acute findings. IMPRESSION: 1. Stable appearance post bimalleolar ORIF. Electronically Signed   By: Lucrezia Europe M.D.   On: 01/22/2017 15:54    Cardiac Studies  2D echo 01/16/2017 Study Conclusions  - Left ventricle: The cavity size was normal. Wall thickness was   increased in a pattern of mild LVH. Systolic function was normal.   The estimated ejection fraction was in the range of 55% to 60%.   Wall motion was normal; there were no regional wall motion   abnormalities. The study is not technically sufficient to allow   evaluation of LV diastolic function. - Aortic valve: Trileaflet. Sclerosis without stenosis. There was   mild regurgitation. - Mitral valve: There was moderate regurgitation. - Left atrium: Severely dilated. - Right ventricle: The cavity size was moderately dilated. Systolic   function is mildly reduced. - Tricuspid valve: There was moderate regurgitation. - Pulmonic valve: There was trivial regurgitation. - Pulmonary arteries: PA peak pressure: 52 mm Hg (S). - Inferior vena cava: The vessel was dilated. The respirophasic   diameter changes were blunted (< 50%), consistent with elevated   central venous pressure.  Impressions:  - Compared to a prior study in 09/2016, the LVEF is stable. There is   severe LAE, moderate RAE, moderate RVE and mild RV systolic   dysfunction. At least moderate MR and TR, RVSP 52 mmHg, dilated   IVC - atrial fibrillation with RVR is present.   Patient Profile     83 y.o. male with a hx of persistant atrial fibrillation with 7 cardioversions since 03/2014 and warfarin therapy, moderate MR, CAD status post stent placement, hyperlipidemia, GERD, hypothyroidism, OSA on CPAP, BPH, iron deficiency anemia, CK D stage III, depression and anxiety who is being seen for the evaluation of atrial fibrillation with rapid ventricular responseat the request of Dr Thereasa Solo.  Assessment & Plan     1. Persistent atrial fibrillation  He has failed amiodarone and has had 15 cardioversions since 2015. His left atrium is severely dilated and it is unlikely that he will remain in sinus rhythm. It is likely that his atrial fibrillation has become permanent and a rate control strategy would be best.  -  he may benefit from AV nodal ablation with pacemaker implantation. That being said, it is possible that AV nodal ablation will not improve his functional status only his heart rate. - will continue warfarin for CHADSVASC2 score of 4.  INR therapeutic at 3.07 - HR fairly well controlled on Diltiazem ER to 120mg  BID but BP soft this am - discussed with son that AVN ablation and PPM not an option at this point given his declining state with possible sepsis  2. Acute on chronic diastolic CHF - he has put out 1.2L since yesterday and is net neg 7.2L.   - his creatinine had slight bump today (1.18>>1.19>>1.27). - weight is down 23lbs from 7/2 - he still has significant crackles on exam today and CXRAY shows upper lobe infiltrates ? Edema vs infiltrate.  His WBC is trending upward and ? Wether this may be PNA instead of significant CHF as BNP is trending downward.  Will defer to [facility] on RX for possible PNA - discussed this with [facility] today and they will broaden antibx coverage - As his BP is soft will hold on lasix for now - repeat chest xray  - place PICC line to get CVP to help guide diuretic therapy - unsure how much of declining respiratory status is CHF vs. PNA  3. Hypertension Blood pressure soft despite stopping  Doxazosin - ? Whether is is septic   4. CAD No current chest pain - continue statin - no ASA due to warfarin and advanced age.   5. OSA Continue CPAP  6. Hypothyroidism Per internal medicine  7. Hyper-lipidemia Goal LDL less than 70.  - continue pravastatin  8. Mitral regurgitation - Moderate on echo. Continue current management. - this could be etiology of some of his  CHF but not a surgical candidate due to advanced age and comorbidities  I have spent a total of 35 minutes with patient reviewing hospital notes , telemetry, EKGs, labs and examining patient as well as establishing an assessment and plan that was discussed with the patient.  > 50% of time was spent in direct patient care.    Signed, Fransico Him, MD  01/23/2017, 10:10 AM

## 2017-01-23 NOTE — Progress Notes (Signed)
Orthopedic Tech Progress Note Patient Details:  [name] August 19, 1926 [id number] Cam walker was taken to pt room and can be putting on when walking per order. Patient ID: [name], male   DOB: Jul 14, 1927, 83 y.o.   MRN: [id number]   [name] [facility] 01/23/2017, 8:15 AM

## 2017-01-23 NOTE — Progress Notes (Signed)
Updated report given via Tourist information centre manager format, updated on VS, new orders and events of the day, assumed care of patient. I spent most of the first half of the shift trying to calm patient who was in resp. Distress and with a lot of encouragement and distractions was able to get him to calm enough to be able to clean him up, change his condom catheter and change his bed without his sats dropping too much. Patient's son in the room and assisted with trying to calm him but was unable to. I just sat with him for ashile and he finally calmed down. Patient was on High flow O2 at 15 Liters with sats 88-94%. He sounds more crackly on his left side than yesterday but MD notes verify what I have seen and his prognosis looks poor due to all his comorbidities. I talked with the son for ashile and just allowed him to vent his concerns and I do think that he only wants what is best for his father and that if he is unable to get back to the quality of life that he had will speak with his mother and determine what to do.

## 2017-01-23 NOTE — Progress Notes (Signed)
No charge note:   Palliative consult received:  Contacted patient son: Berthel Bagnall per patient's wife request to arrange family meeting. Randy declined consult. Requested palliative team reengage specifically on Thursday.  PMT will contact Randy on Thursday to arrange a meeting time.  Mariana Kaufman, AGNP-C Palliative Medicine  Please call Palliative Medicine team phone with any questions [phone]. For individual providers please see AMION.

## 2017-01-23 NOTE — Progress Notes (Signed)
ANTICOAGULATION CONSULT NOTE - Follow Up Consult  Pharmacy Consult for Coumadin + Zosyn Indication: atrial fibrillation + PNA  Allergies  Allergen Reactions  . Metoprolol Tartrate Other (See Comments)    Anxiety, heart rate goes too low, feels like in a fog.    Patient Measurements: Height: 5\' 11"  (180.3 cm) Weight: 175 lb (79.4 kg) IBW/kg (Calculated) : 75.3   Vital Signs: Temp: 98 F (36.7 C) (07/10 1131) Temp Source: Axillary (07/10 1131) BP: 92/62 (07/10 0350) Pulse Rate: 97 (07/10 1415)  Labs:  Recent Labs  01/21/17 0343 01/22/17 0306 01/23/17 0307  HGB 10.6*  --  9.5*  HCT 32.0*  --  28.3*  PLT 371  --  412*  LABPROT 28.8* 30.3* 32.4*  INR 2.65 2.82 3.07  CREATININE 1.19 1.27* 1.37*    Estimated Creatinine Clearance: 40.5 mL/min (A) (by C-G formula based on SCr of 1.37 mg/dL (H)).   Assessment:  Anticoag: Warfarin for hx afib - INR 3.07. Hgb 10.6>9.5, pltc stable, LFTs normalized.  - (PTA dose 2mg  daily) -Recent tibia/fibula repair 6/22-6/25  ID: s/p abx sepsis of unkn source/PNA. WBC 14 up. Afebrile. Scr 1.37 trending up (CrCl 40). Also on Florastor. Respiratory status worsened. Resume Zosyn 7/10.   Vanc 7/1>> 7/2; 7/3>>7/5, 7/10>> Zosyn 7/1>>7/6  7/2 Legionella UAg >> negative 7/2 strep pneumo UAg >> negative 7/2 Hepatitis>> negative 7/2 Resp panel>> negative 7/2 MRSA PCR >> negative 7/1 BCxr >> CNS x 1 7/1 UCr > negative   Goal of Therapy:  INR 2-3  Eradication of infection Monitor platelets by anticoagulation protocol: Yes   Plan:  Hold Coumadin Daily INR Zosyn 3.375g IV q 8hr   Detrich Rakestraw S. Alford Highland, PharmD, BCPS Clinical Staff Pharmacist Pager [phone]  Eilene Ghazi Stillinger 01/23/2017,2:47 PM

## 2017-01-23 NOTE — Progress Notes (Signed)
Patient ID: [name], male   DOB: 1926/08/14, 83 y.o.   MRN: [id number]  PROGRESS NOTE    [name]  ZJQ:[id number] DOB: 05-Nov-1926 DOA: 01/17/2017 PCP: Marletta Lor, MD   Brief Narrative:  83 y.o.malewith history of Hypertension, hyperlipidemia, GERD, hypothyroidism, depression, anxiety, OSA on CPAP, A. fib on Coumadin, popliteal artery embolism, MVregurg, CAD s/p stent placement, BPH, iron deficiency anemia, and CKD3 who presented with confusion, cough, shortness breath, and nausea w/ some limited vomiting.  Pt was hospitalized for a R tibia/fib fracture 6/22-6/25. Patient had surgery, and was discharged to a rehab facility. Per his son he began to be confused and lethargic, w/ poor appetite and decreased oral intake, as well as oxygen desaturations to 60-70%. He was admitted for respiratory failure secondary to pulmonary edema and was also treated with broad spectrum antibiotics initially for SIRS versus pneumonia   Assessment & Plan:   Principal Problem:   Sepsis ([place]) Active Problems:   Hypothyroidism   HLD (hyperlipidemia)   Essential hypertension   CAD (coronary artery disease)   GERD   Iron deficiency anemia   Chronic anticoagulation   Anemia   Atrial fibrillation with RVR (HCC)   Elevated troponin   Abnormal LFTs   Acute renal failure superimposed on stage 3 chronic kidney disease (HCC)   Anxiety   Acute metabolic encephalopathy   Nausea, vomiting, and diarrhea   Acute respiratory failure with hypoxia (HCC)   Hyponatremia   Acute diastolic heart failure (HCC)   Acute on chronic right-sided heart failure (HCC)   SOB (shortness of breath)   Acute hypoxic respiratory failure - Most likely cerebellar to pulmonary edema from heart failure. Patient was recently treated with few days of broad-spectrum antibiotics/Zosyn and then antibiotics were discontinued. - Respiratory status has become worse. Chest x-ray from 01/22/2017 showed diffuse opacities. After  discussion with the cardiologist, it was decided that we will restart broad-spectrum antibiotics and repeat chest x-ray for today. If blood pressure allows, additional Lasix will be given. If not, patient might need intravenous dopamine or Levophed as per cardiology. - PICC line ordered for the same - Overall prognosis is guarded to poor. Discussed with son extensively, he wants to see how his father improves over the next 1 or 2 days with antibiotics. Palliative care consult - Morphine when necessary for air hunger  Acute and chronic diastolic heart failure - Negative fluid balance of 7524 mL's with negative fluid balance WAS 50 mL over the last 24 hours. TTE had shown severe LAE, moderate RAE with moderate MR and TR. - Continue strict input and output, daily weights. Follow further recommendations from cardiology. - Blood pressure on the lower side; respiratory status worsens. Repeat chest x-ray today - Creatinine slightly more elevated today at 1.37. Repeat a.m. Labs  Persistent leukocytosis - Slight worsening of WBCs today. Repeat a.m. labs. Restart antibiotics. Repeat cultures  Persistent A. Fib -Currently oral Cardizem and Coumadin. Off cardizem drip. Heart rate controlled currently - Status post TEE DCCV in March 2018 - Follow further recommendations from cardiology  Status post treatment for SIRS - Blood culture had grown others negative staph which probably was contaminant. Patient was treated with broad-spectrum antibiotics for a few days  Transaminitis - shock liver v/s congestive hepatopathy  - acute viral hepatitis panel negative - no evidence of acute gallbladder disease on ultrasound - has resolved   Acute metabolic encephalopathy no acute findings on CT head  - suspect this was related to  dehydration and hyponatremia  - X-90 and folic acid levels normal - now at baseline mental status   Hyponatremia Sodium Fluctuating; probably from CHF   - follow AM labs  Acute  kidney injury on chronic kidney disease stage III - Creatinine at 1.37 today. Repeat a.m. labs. Patient will be given 1 more dose of Lasix today if blood pressure allows  Hypothyroidism continue Synthroid  Status post surgical treatment of R closed Tib/Fib fx no evidence of acute complications at this time - continue PT/OT  - Follow orthopedic recommendations  HLD  HTN Blood pressure on the lower side. Currently on Cardizem orally. Monitor blood pressure  CAD Cardiology follow-up  GERD  OSA Home CPAP   DVT prophylaxis: Coumadin Code Status:  DO NOT RESUSCITATE Family Communication: Spoke to son present at bedside Disposition Plan: Unclear at this time  Consultants: Cardiology and orthopedics  Procedures: None  Antimicrobials: Zosyn and vancomycin from 02/03/2017 to 01/18/2017    Subjective: Patient seen and examined at bedside. He is slightly awake but extremely short of breath and hardly completes a sentence. No current chest pain, fever or vomiting.  Objective: Vitals:   01/23/17 0026 01/23/17 0350 01/23/17 0742 01/23/17 1131  BP: 106/66 92/62    Pulse: (!) 102 98 97 96  Resp: 20 (!) 22 (!) 21 (!) 22  Temp:  98 F (36.7 C) 98 F (36.7 C) 98 F (36.7 C)  TempSrc:  Axillary Axillary Axillary  SpO2: 93% 99% 98% 97%  Weight:  79.4 kg (175 lb)    Height:        Intake/Output Summary (Last 24 hours) at 01/23/17 1354 Last data filed at 01/23/17 0900  Gross per 24 hour  Intake              710 ml  Output              600 ml  Net              110 ml   Filed Weights   01/20/17 0543 01/22/17 0415 01/23/17 0350  Weight: 81.6 kg (180 lb) 79.4 kg (175 lb) 79.4 kg (175 lb)    Examination:  General exam: Appears In moderate distress secondary to dyspnea  Respiratory system: Bilateral decreased breath sound at bases with scattered crackles; tachypneic Cardiovascular system: S1 & S2 heard, rate controlled  Gastrointestinal system: Abdomen is nondistended,  soft and nontender. Normal bowel sounds heard. Central nervous system: Awake. No focal neurological deficits. Moving extremities Extremities: No cyanosis, clubbing, bilateral lower extremity 1+ edema Skin: No rashes, lesions or ulcers Lymph: No cervical lymphadenopathy    Data Reviewed: I have personally reviewed following labs and imaging studies  CBC:  Recent Labs Lab 01/18/17 0446 01/19/17 0356 01/20/17 0222 01/21/17 0343 01/23/17 0307  WBC 12.3* 12.9* 13.2* 13.3* 14.0*  HGB 9.8* 10.7* 10.4* 10.6* 9.5*  HCT 29.1* 32.2* 30.8* 32.0* 28.3*  MCV 91.2 92.5 91.1 91.4 91.6  PLT 244 336 335 371 240*   Basic Metabolic Panel:  Recent Labs Lab 01/17/17 0358  01/18/17 1415 01/18/17 1853 01/19/17 0356 01/20/17 0222 01/21/17 0343 01/22/17 0306 01/23/17 0307  NA 136  < >  --   --  137 136 134* 133* 132*  K 3.7  < > 3.0* 2.9* 3.7 3.8 4.5 5.2* 4.8  CL 105  < >  --   --  103 101 98* 100* 99*  CO2 23  < >  --   --  24 25  26 26 24   GLUCOSE 117*  < >  --   --  117* 116* 113* 137* 114*  BUN 27*  < >  --   --  17 14 16 19  28*  CREATININE 1.27*  < >  --   --  1.24 1.18 1.19 1.27* 1.37*  CALCIUM 8.1*  < >  --   --  8.3* 8.3* 8.5* 8.4* 8.4*  MG 1.9  --  1.7 2.2 2.2  --   --   --   --   < > = values in this interval not displayed. GFR: Estimated Creatinine Clearance: 40.5 mL/min (A) (by C-G formula based on SCr of 1.37 mg/dL (H)). Liver Function Tests:  Recent Labs Lab 01/21/17 0343  AST 31  ALT 60  ALKPHOS 104  BILITOT 1.3*  PROT 5.8*  ALBUMIN 2.2*   No results for input(s): LIPASE, AMYLASE in the last 168 hours. No results for input(s): AMMONIA in the last 168 hours. Coagulation Profile:  Recent Labs Lab 01/19/17 0356 01/20/17 0222 01/21/17 0343 01/22/17 0306 01/23/17 0307  INR 3.08 2.92 2.65 2.82 3.07   Cardiac Enzymes: No results for input(s): CKTOTAL, CKMB, CKMBINDEX, TROPONINI in the last 168 hours. BNP (last 3 results) No results for input(s): PROBNP in the  last 8760 hours. HbA1C: No results for input(s): HGBA1C in the last 72 hours. CBG: No results for input(s): GLUCAP in the last 168 hours. Lipid Profile: No results for input(s): CHOL, HDL, LDLCALC, TRIG, CHOLHDL, LDLDIRECT in the last 72 hours. Thyroid Function Tests: No results for input(s): TSH, T4TOTAL, FREET4, T3FREE, THYROIDAB in the last 72 hours. Anemia Panel: No results for input(s): VITAMINB12, FOLATE, FERRITIN, TIBC, IRON, RETICCTPCT in the last 72 hours. Sepsis Labs:  Recent Labs Lab 01/17/17 0358  LATICACIDVEN 1.1    Recent Results (from the past 240 hour(s))  Urine Culture     Status: None   Collection Time: 02/12/2017  1:25 AM  Result Value Ref Range Status   Specimen Description URINE, RANDOM  Final   Special Requests NONE  Final   Culture NO GROWTH  Final   Report Status 01/16/2017 FINAL  Final  Blood Culture (routine x 2)     Status: Abnormal   Collection Time: 02/05/2017  5:36 PM  Result Value Ref Range Status   Specimen Description BLOOD LEFT FOREARM  Final   Special Requests IN PEDIATRIC BOTTLE Blood Culture adequate volume  Final   Culture  Setup Time   Final    GRAM POSITIVE COCCI IN CLUSTERS IN PEDIATRIC BOTTLE CRITICAL RESULT CALLED TO, READ BACK BY AND VERIFIED WITH: F. Sherrelwood, AT [phone] 01/16/17 BY D. VANHOOK    Culture (A)  Final    STAPHYLOCOCCUS SPECIES (COAGULASE NEGATIVE) THE SIGNIFICANCE OF ISOLATING THIS ORGANISM FROM A SINGLE SET OF BLOOD CULTURES WHEN MULTIPLE SETS ARE DRAWN IS UNCERTAIN. PLEASE NOTIFY THE MICROBIOLOGY DEPARTMENT WITHIN ONE WEEK IF SPECIATION AND SENSITIVITIES ARE REQUIRED.    Report Status 01/18/2017 FINAL  Final  Blood Culture (routine x 2)     Status: None   Collection Time: 02/04/2017  5:56 PM  Result Value Ref Range Status   Specimen Description BLOOD LEFT WRIST  Final   Special Requests   Final    BOTTLES DRAWN AEROBIC AND ANAEROBIC Blood Culture adequate volume   Culture NO GROWTH 5 DAYS  Final   Report Status  01/19/2017 FINAL  Final  Respiratory Panel by PCR     Status: None  Collection Time: 01/15/17 12:42 AM  Result Value Ref Range Status   Adenovirus NOT DETECTED NOT DETECTED Final   Coronavirus 229E NOT DETECTED NOT DETECTED Final   Coronavirus HKU1 NOT DETECTED NOT DETECTED Final   Coronavirus NL63 NOT DETECTED NOT DETECTED Final   Coronavirus OC43 NOT DETECTED NOT DETECTED Final   Metapneumovirus NOT DETECTED NOT DETECTED Final   Rhinovirus / Enterovirus NOT DETECTED NOT DETECTED Final   Influenza A NOT DETECTED NOT DETECTED Final   Influenza B NOT DETECTED NOT DETECTED Final   Parainfluenza Virus 1 NOT DETECTED NOT DETECTED Final   Parainfluenza Virus 2 NOT DETECTED NOT DETECTED Final   Parainfluenza Virus 3 NOT DETECTED NOT DETECTED Final   Parainfluenza Virus 4 NOT DETECTED NOT DETECTED Final   Respiratory Syncytial Virus NOT DETECTED NOT DETECTED Final   Bordetella pertussis NOT DETECTED NOT DETECTED Final   Chlamydophila pneumoniae NOT DETECTED NOT DETECTED Final   Mycoplasma pneumoniae NOT DETECTED NOT DETECTED Final  MRSA PCR Screening     Status: None   Collection Time: 01/15/17 12:44 AM  Result Value Ref Range Status   MRSA by PCR NEGATIVE NEGATIVE Final    Comment:        The GeneXpert MRSA Assay (FDA approved for NASAL specimens only), is one component of a comprehensive MRSA colonization surveillance program. It is not intended to diagnose MRSA infection nor to guide or monitor treatment for MRSA infections.          Radiology Studies: Dg Chest 2 View  Result Date: 01/22/2017 CLINICAL DATA:  CHF, shortness of Breath EXAM: CHEST  2 VIEW COMPARISON:  01/19/2017 FINDINGS: Stable cardiomegaly and diffuse interstitial opacities with superimposed airspace opacities in the upper lobes. No change since prior study. No effusions. IMPRESSION: No significant change diffuse interstitial and bilateral upper lobe airspace opacities. This could represent edema or infection.  Electronically Signed   By: Rolm Baptise M.D.   On: 01/22/2017 10:44   Dg Chest Port 1 View  Result Date: 01/23/2017 CLINICAL DATA:  Worsening shortness of breath EXAM: PORTABLE CHEST 1 VIEW COMPARISON:  Chest x-ray of 01/22/2017 FINDINGS: There has been worsening of diffuse airspace disease. There is cardiomegaly present but no definite pleural effusion is seen. These findings could be due to diffuse pneumonia or edema or a combination of those two entities. No bony abnormality is evident other than degenerative change in the thoracic spine. IMPRESSION: 1. Worsening of diffuse airspace disease.  Pneumonia versus edema. 2. Stable cardiomegaly Electronically Signed   By: Ivar Drape M.D.   On: 01/23/2017 11:36   Dg Ankle Right Port  Result Date: 01/22/2017 CLINICAL DATA:  Post op ORIF x 3 weeks ago and pt still having lots of pain EXAM: PORTABLE RIGHT ANKLE - 2 VIEW COMPARISON:  01/06/2017 and previous FINDINGS: Cast material obscures fine detail. Stable plate and screw fixation of the distal fibular shaft, and cannulated screw fixation of the medial malleolus, hardware intact without surrounding lucency. Fracture fragments in near anatomic alignment. No acute findings. IMPRESSION: 1. Stable appearance post bimalleolar ORIF. Electronically Signed   By: Lucrezia Europe M.D.   On: 01/22/2017 15:54        Scheduled Meds: . clonazePAM  1 mg Oral QHS  . diltiazem  120 mg Oral Q12H  . finasteride  5 mg Oral Daily  . levothyroxine  50 mcg Oral QAC breakfast  . megestrol  800 mg Oral Daily  . multivitamin with minerals  1 tablet Oral Daily  .  omega-3 acid ethyl esters  1 g Oral Daily  . pantoprazole  40 mg Oral BID  . pravastatin  40 mg Oral QPM  . saccharomyces boulardii  250 mg Oral BID  . traZODone  25 mg Oral QHS  . warfarin  1 mg Oral Q48H  . warfarin  2 mg Oral Q48H  . Warfarin - Pharmacist Dosing Inpatient   Does not apply q1800   Continuous Infusions: . sodium chloride 10 mL/hr at 01/21/17  0742  . piperacillin-tazobactam (ZOSYN)  IV 3.375 g (01/23/17 1302)     LOS: 9 days        Aline August, MD [facility] Pager [phone]  If 7PM-7AM, please contact night-coverage [email or web address] Password [email or web address] 01/23/2017, 1:54 PM

## 2017-01-23 NOTE — Plan of Care (Signed)
Problem: Pain Managment: Goal: General experience of comfort will improve Outcome: Progressing Medical regimen being adjusted by medical and cardiology, IV zosyn started, PICC line ordered to monitor CVPs, morphine also ordered for "air hunger" for pt comfort. (palliative consult had been placed, note they have spoke with son, son wants to see if other medical interventions help his father improve before making decisions with palliative care --note plan for palliative to see again on Thursday this week.)

## 2017-01-24 DIAGNOSIS — J189 Pneumonia, unspecified organism: Secondary | ICD-10-CM

## 2017-01-24 DIAGNOSIS — I4891 Unspecified atrial fibrillation: Secondary | ICD-10-CM

## 2017-01-24 DIAGNOSIS — A419 Sepsis, unspecified organism: Principal | ICD-10-CM

## 2017-01-24 DIAGNOSIS — I5031 Acute diastolic (congestive) heart failure: Secondary | ICD-10-CM

## 2017-01-24 DIAGNOSIS — I50813 Acute on chronic right heart failure: Secondary | ICD-10-CM

## 2017-01-24 DIAGNOSIS — R945 Abnormal results of liver function studies: Secondary | ICD-10-CM

## 2017-01-24 DIAGNOSIS — J9601 Acute respiratory failure with hypoxia: Secondary | ICD-10-CM

## 2017-01-24 DIAGNOSIS — G9340 Encephalopathy, unspecified: Secondary | ICD-10-CM

## 2017-01-24 LAB — CBC WITH DIFFERENTIAL/PLATELET
Basophils Absolute: 0 10*3/uL (ref 0.0–0.1)
Basophils Relative: 0 %
Eosinophils Absolute: 0.2 10*3/uL (ref 0.0–0.7)
Eosinophils Relative: 1 %
HEMATOCRIT: 29.3 % — AB (ref 39.0–52.0)
HEMOGLOBIN: 10 g/dL — AB (ref 13.0–17.0)
LYMPHS ABS: 0.5 10*3/uL — AB (ref 0.7–4.0)
LYMPHS PCT: 3 %
MCH: 30.8 pg (ref 26.0–34.0)
MCHC: 34.1 g/dL (ref 30.0–36.0)
MCV: 90.2 fL (ref 78.0–100.0)
Monocytes Absolute: 1.4 10*3/uL — ABNORMAL HIGH (ref 0.1–1.0)
Monocytes Relative: 9 %
NEUTROS PCT: 87 %
Neutro Abs: 14.4 10*3/uL — ABNORMAL HIGH (ref 1.7–7.7)
Platelets: 430 10*3/uL — ABNORMAL HIGH (ref 150–400)
RBC: 3.25 MIL/uL — AB (ref 4.22–5.81)
RDW: 14.4 % (ref 11.5–15.5)
WBC: 16.5 10*3/uL — AB (ref 4.0–10.5)

## 2017-01-24 LAB — MAGNESIUM: Magnesium: 2 mg/dL (ref 1.7–2.4)

## 2017-01-24 LAB — COMPREHENSIVE METABOLIC PANEL
ALK PHOS: 112 U/L (ref 38–126)
ALT: 32 U/L (ref 17–63)
AST: 29 U/L (ref 15–41)
Albumin: 2 g/dL — ABNORMAL LOW (ref 3.5–5.0)
Anion gap: 9 (ref 5–15)
BUN: 29 mg/dL — ABNORMAL HIGH (ref 6–20)
CALCIUM: 8.4 mg/dL — AB (ref 8.9–10.3)
CO2: 24 mmol/L (ref 22–32)
CREATININE: 1.27 mg/dL — AB (ref 0.61–1.24)
Chloride: 98 mmol/L — ABNORMAL LOW (ref 101–111)
GFR, EST AFRICAN AMERICAN: 57 mL/min — AB (ref >60)
GFR, EST NON AFRICAN AMERICAN: 49 mL/min — AB (ref >60)
Glucose, Bld: 118 mg/dL — ABNORMAL HIGH (ref 65–99)
Potassium: 4.5 mmol/L (ref 3.5–5.1)
SODIUM: 131 mmol/L — AB (ref 135–145)
Total Bilirubin: 1.5 mg/dL — ABNORMAL HIGH (ref 0.3–1.2)
Total Protein: 5.8 g/dL — ABNORMAL LOW (ref 6.5–8.1)

## 2017-01-24 LAB — PROTIME-INR
INR: 2.93
Prothrombin Time: 31.2 seconds — ABNORMAL HIGH (ref 11.4–15.2)

## 2017-01-24 MED ORDER — LORAZEPAM 2 MG/ML IJ SOLN
0.5000 mg | INTRAMUSCULAR | Status: DC | PRN
  Administered 2017-01-24 – 2017-01-25 (×5): 0.5 mg via INTRAVENOUS
  Filled 2017-01-24 (×5): qty 1

## 2017-01-24 MED ORDER — MORPHINE SULFATE (PF) 2 MG/ML IV SOLN
0.5000 mg | INTRAVENOUS | Status: DC | PRN
  Administered 2017-01-24 – 2017-01-25 (×2): 0.5 mg via INTRAVENOUS
  Filled 2017-01-24 (×2): qty 1

## 2017-01-24 MED ORDER — WARFARIN SODIUM 1 MG PO TABS
1.0000 mg | ORAL_TABLET | Freq: Once | ORAL | Status: AC
  Administered 2017-01-24: 1 mg via ORAL
  Filled 2017-01-24: qty 1

## 2017-01-24 NOTE — Progress Notes (Signed)
Progress Note  Patient Name: [name] Date of Encounter: 01/24/2017  Primary Cardiologist: Drs. Hochrein/Camnitz  Subjective   Had worsening SOB overnight but did not tolerate CPAP.  Patient now getting MSO4 to help with air hunger.  Currently on NRB mask and O2 sats 96%  Inpatient Medications    Scheduled Meds: . clonazePAM  1 mg Oral QHS  . diltiazem  120 mg Oral Q12H  . finasteride  5 mg Oral Daily  . levothyroxine  50 mcg Oral QAC breakfast  . megestrol  800 mg Oral Daily  . multivitamin with minerals  1 tablet Oral Daily  . omega-3 acid ethyl esters  1 g Oral Daily  . pantoprazole  40 mg Oral BID  . pravastatin  40 mg Oral QPM  . saccharomyces boulardii  250 mg Oral BID  . sodium chloride flush  10-40 mL Intracatheter Q12H  . traZODone  25 mg Oral QHS  . Warfarin - Pharmacist Dosing Inpatient   Does not apply q1800   Continuous Infusions: . sodium chloride 10 mL/hr at 01/23/17 2015  . piperacillin-tazobactam (ZOSYN)  IV Stopped (01/24/17 0800)   PRN Meds: acetaminophen, dextromethorphan-guaiFENesin, hydroxypropyl methylcellulose / hypromellose, levalbuterol, LORazepam, morphine injection, nitroGLYCERIN, oxyCODONE, sodium chloride flush   Vital Signs    Vitals:   01/23/17 2300 01/24/17 0318 01/24/17 0810 01/24/17 0906  BP: 135/80 125/84  135/87  Pulse: (!) 105 (!) 108 (!) 102 (!) 112  Resp: (!) 25 (!) 33 20 (!) 22  Temp: 98.7 F (37.1 C)     TempSrc: Axillary     SpO2: 97% 92% 94% 96%  Weight:  176 lb (79.8 kg)    Height:        Intake/Output Summary (Last 24 hours) at 01/24/17 1047 Last data filed at 01/24/17 0320  Gross per 24 hour  Intake              950 ml  Output              600 ml  Net              350 ml   Filed Weights   01/22/17 0415 01/23/17 0350 01/24/17 0318  Weight: 175 lb (79.4 kg) 175 lb (79.4 kg) 176 lb (79.8 kg)    Telemetry    Atrial fibrillation with RVR - HR in the low 100's - Personally Reviewed  ECG    No new EKG  to review- Personally Reviewed  Physical Exam   GEN: frail elderly male in mild respiratory distress Neck:no bruit Cardiac: irregularly irregular with no M/R/G Respiratory: bilateral rales GI: soft NT, ND MS:no edema Neuro: nonfocal Psych:  Labs    Chemistry  Recent Labs Lab 01/21/17 0343 01/22/17 0306 01/23/17 0307 01/24/17 0356  NA 134* 133* 132* 131*  K 4.5 5.2* 4.8 4.5  CL 98* 100* 99* 98*  CO2 26 26 24 24   GLUCOSE 113* 137* 114* 118*  BUN 16 19 28* 29*  CREATININE 1.19 1.27* 1.37* 1.27*  CALCIUM 8.5* 8.4* 8.4* 8.4*  PROT 5.8*  --   --  5.8*  ALBUMIN 2.2*  --   --  2.0*  AST 31  --   --  29  ALT 60  --   --  32  ALKPHOS 104  --   --  112  BILITOT 1.3*  --   --  1.5*  GFRNONAA 53* 49* 45* 49*  GFRAA >60 57* 52* 57*  ANIONGAP 10  7 9 9      Hematology  Recent Labs Lab 01/21/17 0343 01/23/17 0307 01/24/17 0356  WBC 13.3* 14.0* 16.5*  RBC 3.50* 3.09* 3.25*  HGB 10.6* 9.5* 10.0*  HCT 32.0* 28.3* 29.3*  MCV 91.4 91.6 90.2  MCH 30.3 30.7 30.8  MCHC 33.1 33.6 34.1  RDW 14.5 14.6 14.4  PLT 371 412* 430*    Cardiac EnzymesNo results for input(s): TROPONINI in the last 168 hours. No results for input(s): TROPIPOC in the last 168 hours.   BNP  Recent Labs Lab 01/22/17 1138  BNP 384.9*     DDimer No results for input(s): DDIMER in the last 168 hours.   Radiology    Dg Chest Port 1 View  Result Date: 01/23/2017 CLINICAL DATA:  PICC line placement EXAM: PORTABLE CHEST 1 VIEW COMPARISON:  Chest x-ray from earlier same day. FINDINGS: Right-sided PICC line has been placed with tip well-positioned at the level of the lower SVC/ cavoatrial junction. Again noted is diffuse bilateral airspace disease, unchanged in the short-term interval. No pleural effusion or pneumothorax seen. Cardiomegaly is stable. Atherosclerotic changes noted at the aortic arch. IMPRESSION: 1. Right-sided PICC line placement with tip well-positioned at the level of the lower  SVC/cavoatrial junction. 2. Diffuse bilateral airspace disease, unchanged in the short-term interval, most compatible with pneumonia and/or edema. 3. Aortic atherosclerosis. 4. Cardiomegaly. Electronically Signed   By: Franki Cabot M.D.   On: 01/23/2017 17:57   Dg Chest Port 1 View  Result Date: 01/23/2017 CLINICAL DATA:  Worsening shortness of breath EXAM: PORTABLE CHEST 1 VIEW COMPARISON:  Chest x-ray of 01/22/2017 FINDINGS: There has been worsening of diffuse airspace disease. There is cardiomegaly present but no definite pleural effusion is seen. These findings could be due to diffuse pneumonia or edema or a combination of those two entities. No bony abnormality is evident other than degenerative change in the thoracic spine. IMPRESSION: 1. Worsening of diffuse airspace disease.  Pneumonia versus edema. 2. Stable cardiomegaly Electronically Signed   By: Ivar Drape M.D.   On: 01/23/2017 11:36   Dg Ankle Right Port  Result Date: 01/22/2017 CLINICAL DATA:  Post op ORIF x 3 weeks ago and pt still having lots of pain EXAM: PORTABLE RIGHT ANKLE - 2 VIEW COMPARISON:  01/06/2017 and previous FINDINGS: Cast material obscures fine detail. Stable plate and screw fixation of the distal fibular shaft, and cannulated screw fixation of the medial malleolus, hardware intact without surrounding lucency. Fracture fragments in near anatomic alignment. No acute findings. IMPRESSION: 1. Stable appearance post bimalleolar ORIF. Electronically Signed   By: Lucrezia Europe M.D.   On: 01/22/2017 15:54    Cardiac Studies  2D echo 01/16/2017 Study Conclusions  - Left ventricle: The cavity size was normal. Wall thickness was   increased in a pattern of mild LVH. Systolic function was normal.   The estimated ejection fraction was in the range of 55% to 60%.   Wall motion was normal; there were no regional wall motion   abnormalities. The study is not technically sufficient to allow   evaluation of LV diastolic function. -  Aortic valve: Trileaflet. Sclerosis without stenosis. There was   mild regurgitation. - Mitral valve: There was moderate regurgitation. - Left atrium: Severely dilated. - Right ventricle: The cavity size was moderately dilated. Systolic   function is mildly reduced. - Tricuspid valve: There was moderate regurgitation. - Pulmonic valve: There was trivial regurgitation. - Pulmonary arteries: PA peak pressure: 52 mm Hg (S). -  Inferior vena cava: The vessel was dilated. The respirophasic   diameter changes were blunted (< 50%), consistent with elevated   central venous pressure.  Impressions:  - Compared to a prior study in 09/2016, the LVEF is stable. There is   severe LAE, moderate RAE, moderate RVE and mild RV systolic   dysfunction. At least moderate MR and TR, RVSP 52 mmHg, dilated   IVC - atrial fibrillation with RVR is present.   Patient Profile     83 y.o. male with a hx of persistant atrial fibrillation with 7 cardioversions since 03/2014 and warfarin therapy, moderate MR, CAD status post stent placement, hyperlipidemia, GERD, hypothyroidism, OSA on CPAP, BPH, iron deficiency anemia, CK D stage III, depression and anxiety who is being seen for the evaluation of atrial fibrillation with rapid ventricular responseat the request of Dr Thereasa Solo.  Assessment & Plan    1. Persistent atrial fibrillation  He has failed amiodarone and has had 15 cardioversions since 2015. His left atrium is severely dilated and it is unlikely that he will remain in sinus rhythm. It is likely that his atrial fibrillation has become permanent and a rate control strategy would be best.  - he may benefit from AV nodal ablation with pacemaker implantation. That being said, it is possible that AV nodal ablation will not improve his functional status only his heart rate.  Discussed with son that this is not an option at this time due to declining status and likely sepsis - will continue warfarin for CHADSVASC2  score of 4.  INR therapeutic at 2.93 - HR mildly elevated on Diltiazem ER to 120mg  BID   2. Acute on chronic diastolic CHF - he has put out 600cc since yesterday and is net neg 6.9L.   - his creatinine had slight bump today (1.18>>1.19>>1.27>>1.37>>1.27). - weight is down 22lbs from 7/2 and up 1 lb yesterday - he still has significant crackles on exam today and CXRAY yesterday more c/w PNA especially with WBC trending upward .  His antibx coverage has been broadened by [facility] but despite that his respiratory status is declining and WBC continues to climb. - CVP has been running around 9 overnight - recheck CVP this am - I think his declining respiratory status is due more to PNA than CHF. - he has continued to decline over the past few days despite aggressive treatment of afib, CHF and PNA.  He is very agitated, remains hypoxic and uncomfortable.  I have had a long discussion with the son today concerning his continued decline.  I do not think the patient is going to survive this and is DNR.  I have recommended that we pursue a Palliative care consult but the son is in denial and does not wish to proceed at this time and wants more time on antibx.  3. Hypertension Blood pressure improved today  4. CAD No current chest pain - continue statin - no ASA due to warfarin and advanced age.   5. OSA Currently on NRB as he did not tolerate CPAP last night  6. Hypothyroidism Per internal medicine  7. Hyper-lipidemia Goal LDL less than 70.  - continue pravastatin  8. Mitral regurgitation - Moderate on echo. Continue current management. - this could be etiology of some of his CHF but not a surgical candidate due to advanced age and comorbidities   Signed, Fransico Him, MD  01/24/2017, 10:47 AM

## 2017-01-24 NOTE — Plan of Care (Signed)
Problem: Physical Regulation: Goal: Ability to maintain clinical measurements within normal limits will improve Outcome: Not Progressing Patient not tolerating CPAP this evening, switching between CPAP and HF all night. Patient no longer wants to wear mask and states, "he's done". Morphine given x1 for air hunger. Restless throughout evening. No family members at bedside throughout night.

## 2017-01-24 NOTE — Progress Notes (Signed)
   01/24/17 0006  BiPAP/CPAP/SIPAP  Mask Type Full face mask  Mask Size Medium  Flow Rate 15 lpm  BiPAP/CPAP/SIPAP CPAP  Patient Home Equipment No  Auto Titrate Yes

## 2017-01-24 NOTE — Progress Notes (Signed)
PROGRESS NOTE  [name]  BTD:[id number] DOB: 22-Feb-1927 DOA: 01/22/2017 PCP: Marletta Lor, MD   Brief Narrative: [name] is an 83 y.o. male with a history of CAD s/p PCI, HTN, HLD, hypothyroidism, OSA on CPAP, persistent AFib despite multiple cardioversions on coumadin, iron-deficiency anemia, stage III CKD, and mitral regurgitation who presented on 7/1 with confusion and dyspnea. He has had abrupt decline in functional status over the past few months punctuated by a fall outside at home with resultant right tib/fib fracture requiring operative fixation during hospitalization 6/22-6/25 after which he was discharged to SNF. While there, his son noted increasing confusion and lethargy associated with decreased per oral intake and hypoxia. He was seen in the ED where CXR demonstrated L > R interstitial opacities, WBC elevated at 14.9, LFTs elevated, and creatinine above baseline consistent with AKI due to dehydration. He was admitted for respiratory failure, started on broad spectrum antibiotics for concern of pneumonia. Cardiology was consulted 7/6 for AFib with RVR. Echo /3 showed preserved EF, severe LAE, moderate RAE and RVE with mildly reduced RV systolic function. Diuresis was limited by renal impairment. Unfortunately, despite diuresing >20lbs, he continues to have crackles and significant respiratory distress requiring HFNC and NIPPV. Repeat CXR 7/10 demonstrated possible increase in upper lobe opacities, so zosyn was restarted. Plan is to monitor on antibiotics for a couple days and give morphine prn air hunger. Code status has been verified to be DNR, though all treatable conditions are to be treated as fully as medically appropriate. If not making progress, palliative care will reevaluate the patient for disposition and further symptomatic management.   Assessment & Plan: Principal Problem:   Sepsis ([place]) Active Problems:   Hypothyroidism   HLD (hyperlipidemia)   Essential  hypertension   CAD (coronary artery disease)   GERD   Iron deficiency anemia   Chronic anticoagulation   Anemia   Atrial fibrillation with RVR (HCC)   Elevated troponin   Abnormal LFTs   Acute renal failure superimposed on stage 3 chronic kidney disease (HCC)   Anxiety   Acute metabolic encephalopathy   Nausea, vomiting, and diarrhea   Acute respiratory failure with hypoxia (HCC)   Hyponatremia   Acute diastolic heart failure (HCC)   Acute on chronic right-sided heart failure (HCC)   SOB (shortness of breath)   Community acquired pneumonia  Acute hypoxic respiratory failure: Due to pulmonary edema due to acute on chronic diastolic CHF, AFib with RVR, and possibly recurrent pneumonia. - Continue supplemental oxygen prn hypoxia and CPAP prn respiratory distress.  - Titrate doses of morphine and ativan prn air hunger/distress.  - Continue to hold IVF's and diuretics with tenuous fluid status  - Continue diltiazem for rate control - Continue antibiotics as below, check PCT in AM  Acute on chronic diastolic CHF: Has maintained net negative balance and still with crackles on exam with respiratory distress.  - Strict I/O, daily weight - Diuresis per cardiology. Limited by creatinine elevation and hypotension. - Monitor CVP by PICC to guide therapy. Was 9 overnight. It seems cardiology is indicating infectious etiology more likely than volume overload.  Permanent atrial fibrillation: Refractory to DCCV x15 in past 3 years. Severely dilated atria do not bode well for further rhythm control efforts.  - Cardiology considering AV nodal ablation and PPM, though it's not clear this would improve functional status, so risk is thought to be prohibitive.  - Continue diltiazem  - Continue coumadin (CHA2DS2-VASc Scoreis 4) per pharmacy,  monitoring daily INR.   HCAP:  - Continue treatment with zosyn. MRSA PCR was negative.  - Monitor follow up blood cultures (initial Cx's negative) - Monitor  leukocytosis, though this may also be reactive to severe illness - Check PCT and trend  Elevated LFTs: Resolved, suspect due to congestive hepatopathy vs. transient ischemia. Acute viral panel negative. U/S negative for GB disease.  - Monitor intermittently  Hyponatremia: Suspect due to cardiac disease. Stable, mild. - Monitor  Right tibia/fibula fracture: s/p fixation during last hospitalization. Wound healing routinely per orthopedics.  - Appreciate orthopedics assistance  HTN: Currently hypotensive despite stopping doxazosin.  - Continue po cardizem and monitor  CAD: No chest pain - Continue statin, cannot tolerate beta blocker. No ASA with anticoagulation.   OSA: Chronic, stable - Continue CPAP  Hypothyroidism: Chronic, stable. TSH 0.789.  - Home dose of synthroid (90mcg/75mcg alternating) changed to 40mcg daily earlier this admission.  DVT prophylaxis: Coumadin Code Status: DNR confirmed Family Communication: Son at bedside Disposition Plan: continue inpatient management, pending clinical course.   Consultants:   Cardiology  Orthopedics  Palliative care  Procedures:   PICC insertion 7/10 RUE dual lumen  Antimicrobials:  Vancomycin, zosyn 7/1 - 7/5  Zosyn 7/10 >>    Subjective: Pt restless last night, staff feels morphine worsened delirium. Pt at one point stated he's "done" and "wants to go home." This morning he nods off to sleep frequently but denies pain. Still very short of breath, no chest pain.   Objective: Vitals:   01/24/17 0318 01/24/17 0810 01/24/17 0906 01/24/17 1205  BP: 125/84  135/87 (!) 145/82  Pulse: (!) 108 (!) 102 (!) 112 (!) 103  Resp: (!) 33 20 (!) 22 (!) 27  Temp:    98.3 F (36.8 C)  TempSrc:    Axillary  SpO2: 92% 94% 96% 94%  Weight: 79.8 kg (176 lb)     Height:        Intake/Output Summary (Last 24 hours) at 01/24/17 1540 Last data filed at 01/24/17 0320  Gross per 24 hour  Intake              660 ml  Output               600 ml  Net               60 ml   Filed Weights   01/22/17 0415 01/23/17 0350 01/24/17 0318  Weight: 79.4 kg (175 lb) 79.4 kg (175 lb) 79.8 kg (176 lb)    Examination: General exam: Elderly dyspneic male laying in bed Respiratory system: Tachypneic, mildly labored on HFNC with diffusely decreased air movement and L > R crackles without wheezes.  Cardiovascular system: Irregular tachycardia. No murmur, rub, or gallop. No JVD, and 1+ pedal edema. Gastrointestinal system: Abdomen soft, non-tender, non-distended, with normoactive bowel sounds. No organomegaly or masses felt. Central nervous system: Drowsy, oriented. No focal neurological deficits. Extremities: Warm, no deformities Skin: No rashes, lesions no ulcers Psychiatry: Judgement and insight appear impaired by somnolence.    Data Reviewed: I have personally reviewed following labs and imaging studies  CBC:  Recent Labs Lab 01/19/17 0356 01/20/17 0222 01/21/17 0343 01/23/17 0307 01/24/17 0356  WBC 12.9* 13.2* 13.3* 14.0* 16.5*  NEUTROABS  --   --   --   --  14.4*  HGB 10.7* 10.4* 10.6* 9.5* 10.0*  HCT 32.2* 30.8* 32.0* 28.3* 29.3*  MCV 92.5 91.1 91.4 91.6 90.2  PLT 336 335 371  412* 409*   Basic Metabolic Panel:  Recent Labs Lab 01/18/17 1415 01/18/17 1853 01/19/17 0356 01/20/17 0222 01/21/17 0343 01/22/17 0306 01/23/17 0307 01/24/17 0356  NA  --   --  137 136 134* 133* 132* 131*  K 3.0* 2.9* 3.7 3.8 4.5 5.2* 4.8 4.5  CL  --   --  103 101 98* 100* 99* 98*  CO2  --   --  24 25 26 26 24 24   GLUCOSE  --   --  117* 116* 113* 137* 114* 118*  BUN  --   --  17 14 16 19  28* 29*  CREATININE  --   --  1.24 1.18 1.19 1.27* 1.37* 1.27*  CALCIUM  --   --  8.3* 8.3* 8.5* 8.4* 8.4* 8.4*  MG 1.7 2.2 2.2  --   --   --   --  2.0   GFR: Estimated Creatinine Clearance: 43.6 mL/min (A) (by C-G formula based on SCr of 1.27 mg/dL (H)). Liver Function Tests:  Recent Labs Lab 01/21/17 0343 01/24/17 0356  AST 31 29  ALT  60 32  ALKPHOS 104 112  BILITOT 1.3* 1.5*  PROT 5.8* 5.8*  ALBUMIN 2.2* 2.0*   No results for input(s): LIPASE, AMYLASE in the last 168 hours. No results for input(s): AMMONIA in the last 168 hours. Coagulation Profile:  Recent Labs Lab 01/20/17 0222 01/21/17 0343 01/22/17 0306 01/23/17 0307 01/24/17 0356  INR 2.92 2.65 2.82 3.07 2.93   Cardiac Enzymes: No results for input(s): CKTOTAL, CKMB, CKMBINDEX, TROPONINI in the last 168 hours. BNP (last 3 results) No results for input(s): PROBNP in the last 8760 hours. HbA1C: No results for input(s): HGBA1C in the last 72 hours. CBG: No results for input(s): GLUCAP in the last 168 hours. Lipid Profile: No results for input(s): CHOL, HDL, LDLCALC, TRIG, CHOLHDL, LDLDIRECT in the last 72 hours. Thyroid Function Tests: No results for input(s): TSH, T4TOTAL, FREET4, T3FREE, THYROIDAB in the last 72 hours. Anemia Panel: No results for input(s): VITAMINB12, FOLATE, FERRITIN, TIBC, IRON, RETICCTPCT in the last 72 hours. Urine analysis:    Component Value Date/Time   COLORURINE AMBER (A) 02/11/2017 0120   APPEARANCEUR CLOUDY (A) 02/05/2017 0120   LABSPEC 1.024 01/29/2017 0120   PHURINE 5.0 01/17/2017 0120   GLUCOSEU NEGATIVE 01/28/2017 0120   HGBUR NEGATIVE 01/29/2017 0120   BILIRUBINUR SMALL (A) 02/13/2017 0120   KETONESUR NEGATIVE 01/18/2017 0120   PROTEINUR 30 (A) 01/15/2017 0120   UROBILINOGEN 2.0 (H) 11/10/2009 0122   NITRITE NEGATIVE 02/05/2017 0120   LEUKOCYTESUR NEGATIVE 02/10/2017 0120   Recent Results (from the past 240 hour(s))  Blood Culture (routine x 2)     Status: Abnormal   Collection Time: 02/02/2017  5:36 PM  Result Value Ref Range Status   Specimen Description BLOOD LEFT FOREARM  Final   Special Requests IN PEDIATRIC BOTTLE Blood Culture adequate volume  Final   Culture  Setup Time   Final    GRAM POSITIVE COCCI IN CLUSTERS IN PEDIATRIC BOTTLE CRITICAL RESULT CALLED TO, READ BACK BY AND VERIFIED WITH: F.  Lamboglia, AT [phone] 01/16/17 BY D. VANHOOK    Culture (A)  Final    STAPHYLOCOCCUS SPECIES (COAGULASE NEGATIVE) THE SIGNIFICANCE OF ISOLATING THIS ORGANISM FROM A SINGLE SET OF BLOOD CULTURES WHEN MULTIPLE SETS ARE DRAWN IS UNCERTAIN. PLEASE NOTIFY THE MICROBIOLOGY DEPARTMENT WITHIN ONE WEEK IF SPECIATION AND SENSITIVITIES ARE REQUIRED.    Report Status 01/18/2017 FINAL  Final  Blood Culture (  routine x 2)     Status: None   Collection Time: 01/21/2017  5:56 PM  Result Value Ref Range Status   Specimen Description BLOOD LEFT WRIST  Final   Special Requests   Final    BOTTLES DRAWN AEROBIC AND ANAEROBIC Blood Culture adequate volume   Culture NO GROWTH 5 DAYS  Final   Report Status 01/19/2017 FINAL  Final  Respiratory Panel by PCR     Status: None   Collection Time: 01/15/17 12:42 AM  Result Value Ref Range Status   Adenovirus NOT DETECTED NOT DETECTED Final   Coronavirus 229E NOT DETECTED NOT DETECTED Final   Coronavirus HKU1 NOT DETECTED NOT DETECTED Final   Coronavirus NL63 NOT DETECTED NOT DETECTED Final   Coronavirus OC43 NOT DETECTED NOT DETECTED Final   Metapneumovirus NOT DETECTED NOT DETECTED Final   Rhinovirus / Enterovirus NOT DETECTED NOT DETECTED Final   Influenza A NOT DETECTED NOT DETECTED Final   Influenza B NOT DETECTED NOT DETECTED Final   Parainfluenza Virus 1 NOT DETECTED NOT DETECTED Final   Parainfluenza Virus 2 NOT DETECTED NOT DETECTED Final   Parainfluenza Virus 3 NOT DETECTED NOT DETECTED Final   Parainfluenza Virus 4 NOT DETECTED NOT DETECTED Final   Respiratory Syncytial Virus NOT DETECTED NOT DETECTED Final   Bordetella pertussis NOT DETECTED NOT DETECTED Final   Chlamydophila pneumoniae NOT DETECTED NOT DETECTED Final   Mycoplasma pneumoniae NOT DETECTED NOT DETECTED Final  MRSA PCR Screening     Status: None   Collection Time: 01/15/17 12:44 AM  Result Value Ref Range Status   MRSA by PCR NEGATIVE NEGATIVE Final    Comment:        The GeneXpert MRSA  Assay (FDA approved for NASAL specimens only), is one component of a comprehensive MRSA colonization surveillance program. It is not intended to diagnose MRSA infection nor to guide or monitor treatment for MRSA infections.   Culture, blood (routine x 2)     Status: None (Preliminary result)   Collection Time: 01/23/17  2:30 PM  Result Value Ref Range Status   Specimen Description BLOOD RIGHT ANTECUBITAL  Final   Special Requests   Final    BOTTLES DRAWN AEROBIC AND ANAEROBIC Blood Culture adequate volume   Culture NO GROWTH < 24 HOURS  Final   Report Status PENDING  Incomplete  Culture, blood (routine x 2)     Status: None (Preliminary result)   Collection Time: 01/23/17  2:30 PM  Result Value Ref Range Status   Specimen Description BLOOD RIGHT HAND  Final   Special Requests   Final    BOTTLES DRAWN AEROBIC AND ANAEROBIC Blood Culture adequate volume   Culture NO GROWTH < 24 HOURS  Final   Report Status PENDING  Incomplete      Radiology Studies: Dg Chest Port 1 View  Result Date: 01/23/2017 CLINICAL DATA:  PICC line placement EXAM: PORTABLE CHEST 1 VIEW COMPARISON:  Chest x-ray from earlier same day. FINDINGS: Right-sided PICC line has been placed with tip well-positioned at the level of the lower SVC/ cavoatrial junction. Again noted is diffuse bilateral airspace disease, unchanged in the short-term interval. No pleural effusion or pneumothorax seen. Cardiomegaly is stable. Atherosclerotic changes noted at the aortic arch. IMPRESSION: 1. Right-sided PICC line placement with tip well-positioned at the level of the lower SVC/cavoatrial junction. 2. Diffuse bilateral airspace disease, unchanged in the short-term interval, most compatible with pneumonia and/or edema. 3. Aortic atherosclerosis. 4. Cardiomegaly. Electronically Signed  By: Franki Cabot M.D.   On: 01/23/2017 17:57   Dg Chest Port 1 View  Result Date: 01/23/2017 CLINICAL DATA:  Worsening shortness of breath EXAM:  PORTABLE CHEST 1 VIEW COMPARISON:  Chest x-ray of 01/22/2017 FINDINGS: There has been worsening of diffuse airspace disease. There is cardiomegaly present but no definite pleural effusion is seen. These findings could be due to diffuse pneumonia or edema or a combination of those two entities. No bony abnormality is evident other than degenerative change in the thoracic spine. IMPRESSION: 1. Worsening of diffuse airspace disease.  Pneumonia versus edema. 2. Stable cardiomegaly Electronically Signed   By: Ivar Drape M.D.   On: 01/23/2017 11:36   Dg Ankle Right Port  Result Date: 01/22/2017 CLINICAL DATA:  Post op ORIF x 3 weeks ago and pt still having lots of pain EXAM: PORTABLE RIGHT ANKLE - 2 VIEW COMPARISON:  01/06/2017 and previous FINDINGS: Cast material obscures fine detail. Stable plate and screw fixation of the distal fibular shaft, and cannulated screw fixation of the medial malleolus, hardware intact without surrounding lucency. Fracture fragments in near anatomic alignment. No acute findings. IMPRESSION: 1. Stable appearance post bimalleolar ORIF. Electronically Signed   By: Lucrezia Europe M.D.   On: 01/22/2017 15:54    Scheduled Meds: . clonazePAM  1 mg Oral QHS  . diltiazem  120 mg Oral Q12H  . finasteride  5 mg Oral Daily  . levothyroxine  50 mcg Oral QAC breakfast  . megestrol  800 mg Oral Daily  . multivitamin with minerals  1 tablet Oral Daily  . omega-3 acid ethyl esters  1 g Oral Daily  . pantoprazole  40 mg Oral BID  . pravastatin  40 mg Oral QPM  . saccharomyces boulardii  250 mg Oral BID  . sodium chloride flush  10-40 mL Intracatheter Q12H  . traZODone  25 mg Oral QHS  . warfarin  1 mg Oral ONCE-1800  . Warfarin - Pharmacist Dosing Inpatient   Does not apply q1800   Continuous Infusions: . sodium chloride 10 mL/hr at 01/23/17 2015  . piperacillin-tazobactam (ZOSYN)  IV 3.375 g (01/24/17 1214)     LOS: 10 days   Time spent: 35 minutes.  Vance Gather, MD [facility] Pager [phone]  If 7PM-7AM, please contact night-coverage [email or web address] Password [facility] 01/24/2017, 3:40 PM

## 2017-01-24 NOTE — Progress Notes (Signed)
ANTICOAGULATION and Antibiotic CONSULT NOTE - Follow Up  Pharmacy Consult for Coumadin + Zosyn Indication: atrial fibrillation + PNA  Patient Measurements: Height: 5\' 11"  (180.3 cm) Weight: 176 lb (79.8 kg) IBW/kg (Calculated) : 75.3   Vital Signs: Temp: 98.3 F (36.8 C) (07/11 1205) Temp Source: Axillary (07/11 1205) BP: 145/82 (07/11 1205) Pulse Rate: 103 (07/11 1205)  Labs:  Recent Labs  01/22/17 0306 01/23/17 0307 01/24/17 0356  HGB  --  9.5* 10.0*  HCT  --  28.3* 29.3*  PLT  --  412* 430*  LABPROT 30.3* 32.4* 31.2*  INR 2.82 3.07 2.93  CREATININE 1.27* 1.37* 1.27*    Estimated Creatinine Clearance: 43.6 mL/min (A) (by C-G formula based on SCr of 1.27 mg/dL (H)).   Assessment:  Anticoag: Warfarin for hx of afib - INR down to 2.93.; Hgb stable at 10.0, PLTs increased to 430, LFTs normalized.  - (PTA dose 2mg  daily) -Recent tibia/fibula repair 6/22-6/25  ID: s/p abx sepsis of unkn source/PNA. WBC 16.5 up. Afebrile. Scr 1.27 (CrCl 43.6). Also on Florastor. Respiratory status worsened. Resumed Zosyn 7/10.   Vanc 7/1>> 7/2; 7/3>>7/5 Zosyn 7/1>>7/6; 7/10>>  7/2 Legionella UAg >> negative 7/2 strep pneumo UAg >> negative 7/2 Hepatitis>> negative 7/2 Resp panel>> negative 7/2 MRSA PCR >> negative 7/1 BCxr >> CNS x 1 7/1 UCr > negative   Goal of Therapy:  INR 2-3  Eradication of infection Monitor platelets by anticoagulation protocol: Yes   Plan:  Warfarin 1 mg x 1 Daily INR, CBC, monitor for s/sx of bleeding Zosyn 3.375g IV q [place] PharmD PGY1 Pharmacy Practice Resident 01/24/2017 2:05 PM Pager: [phone]

## 2017-01-25 DIAGNOSIS — R0600 Dyspnea, unspecified: Secondary | ICD-10-CM

## 2017-01-25 DIAGNOSIS — N179 Acute kidney failure, unspecified: Secondary | ICD-10-CM

## 2017-01-25 DIAGNOSIS — I2510 Atherosclerotic heart disease of native coronary artery without angina pectoris: Secondary | ICD-10-CM

## 2017-01-25 DIAGNOSIS — R0603 Acute respiratory distress: Secondary | ICD-10-CM

## 2017-01-25 DIAGNOSIS — Z7189 Other specified counseling: Secondary | ICD-10-CM

## 2017-01-25 DIAGNOSIS — R748 Abnormal levels of other serum enzymes: Secondary | ICD-10-CM

## 2017-01-25 DIAGNOSIS — Z515 Encounter for palliative care: Secondary | ICD-10-CM

## 2017-01-25 DIAGNOSIS — G9341 Metabolic encephalopathy: Secondary | ICD-10-CM

## 2017-01-25 DIAGNOSIS — D509 Iron deficiency anemia, unspecified: Secondary | ICD-10-CM

## 2017-01-25 DIAGNOSIS — E039 Hypothyroidism, unspecified: Secondary | ICD-10-CM

## 2017-01-25 DIAGNOSIS — N183 Chronic kidney disease, stage 3 (moderate): Secondary | ICD-10-CM

## 2017-01-25 DIAGNOSIS — E871 Hypo-osmolality and hyponatremia: Secondary | ICD-10-CM

## 2017-01-25 LAB — CBC
HCT: 22.8 % — ABNORMAL LOW (ref 39.0–52.0)
HEMOGLOBIN: 7.7 g/dL — AB (ref 13.0–17.0)
MCH: 30.4 pg (ref 26.0–34.0)
MCHC: 33.8 g/dL (ref 30.0–36.0)
MCV: 90.1 fL (ref 78.0–100.0)
Platelets: 511 10*3/uL — ABNORMAL HIGH (ref 150–400)
RBC: 2.53 MIL/uL — AB (ref 4.22–5.81)
RDW: 14.3 % (ref 11.5–15.5)
WBC: 18.8 10*3/uL — ABNORMAL HIGH (ref 4.0–10.5)

## 2017-01-25 LAB — BASIC METABOLIC PANEL
Anion gap: 8 (ref 5–15)
BUN: 35 mg/dL — AB (ref 6–20)
CHLORIDE: 95 mmol/L — AB (ref 101–111)
CO2: 26 mmol/L (ref 22–32)
CREATININE: 1.12 mg/dL (ref 0.61–1.24)
Calcium: 8.5 mg/dL — ABNORMAL LOW (ref 8.9–10.3)
GFR calc Af Amer: >60 mL/min (ref >60)
GFR calc non Af Amer: 57 mL/min — ABNORMAL LOW (ref >60)
GLUCOSE: 129 mg/dL — AB (ref 65–99)
POTASSIUM: 4.7 mmol/L (ref 3.5–5.1)
Sodium: 129 mmol/L — ABNORMAL LOW (ref 135–145)

## 2017-01-25 LAB — PROTIME-INR
INR: 3.08
PROTHROMBIN TIME: 32.5 s — AB (ref 11.4–15.2)

## 2017-01-25 LAB — PROCALCITONIN: Procalcitonin: 0.28 ng/mL

## 2017-01-25 MED ORDER — SODIUM CHLORIDE 0.9% FLUSH: 3.0000 mL | INTRAVENOUS | Status: DC | PRN

## 2017-01-25 MED ORDER — LORAZEPAM 2 MG/ML IJ SOLN: 1.0000 mg | INTRAMUSCULAR | Status: DC

## 2017-01-25 MED ORDER — POLYVINYL ALCOHOL 1.4 % OP SOLN
1.0000 [drp] | Freq: Four times a day (QID) | OPHTHALMIC | Status: DC | PRN
  Filled 2017-01-25: qty 15

## 2017-01-25 MED ORDER — BIOTENE DRY MOUTH MT LIQD: 15.0000 mL | OROMUCOSAL | Status: DC | PRN

## 2017-01-25 MED ORDER — ONDANSETRON HCL 4 MG/2ML IJ SOLN: 4.0000 mg | Freq: Four times a day (QID) | INTRAMUSCULAR | Status: DC | PRN

## 2017-01-25 MED ORDER — HALOPERIDOL LACTATE 5 MG/ML IJ SOLN: 0.5000 mg | INTRAMUSCULAR | Status: DC | PRN

## 2017-01-25 MED ORDER — LORAZEPAM 2 MG/ML IJ SOLN: 2.0000 mg | INTRAMUSCULAR | Status: DC | PRN

## 2017-01-25 MED ORDER — MORPHINE BOLUS VIA INFUSION
4.0000 mg | INTRAVENOUS | Status: DC | PRN
  Administered 2017-01-25: 4 mg via INTRAVENOUS
  Filled 2017-01-25: qty 4

## 2017-01-25 MED ORDER — HALOPERIDOL LACTATE 2 MG/ML PO CONC
0.5000 mg | ORAL | Status: DC | PRN
  Filled 2017-01-25: qty 0.3

## 2017-01-25 MED ORDER — GLYCOPYRROLATE 0.2 MG/ML IJ SOLN: 0.2000 mg | INTRAMUSCULAR | Status: DC | PRN

## 2017-01-25 MED ORDER — SODIUM CHLORIDE 0.9 % IV SOLN
4.0000 mg/h | INTRAVENOUS | Status: DC
  Administered 2017-01-25: 2 mg/h via INTRAVENOUS
  Filled 2017-01-25: qty 10

## 2017-01-25 MED ORDER — GLYCOPYRROLATE 1 MG PO TABS: 1.0000 mg | ORAL_TABLET | ORAL | Status: DC | PRN

## 2017-01-25 MED ORDER — MORPHINE BOLUS VIA INFUSION: 4.0000 mg | INTRAVENOUS | Status: DC | PRN

## 2017-01-25 MED ORDER — SODIUM CHLORIDE 0.9% FLUSH: 3.0000 mL | Freq: Two times a day (BID) | INTRAVENOUS | Status: DC

## 2017-01-25 MED ORDER — LORAZEPAM 2 MG/ML IJ SOLN
2.0000 mg | INTRAMUSCULAR | Status: AC
  Administered 2017-01-25: 2 mg via INTRAVENOUS
  Filled 2017-01-25: qty 1

## 2017-01-25 MED ORDER — ONDANSETRON 4 MG PO TBDP
4.0000 mg | ORAL_TABLET | Freq: Four times a day (QID) | ORAL | Status: DC | PRN
  Filled 2017-01-25: qty 1

## 2017-01-25 MED ORDER — LORAZEPAM 2 MG/ML IJ SOLN: 1.0000 mg | INTRAMUSCULAR | Status: DC | PRN

## 2017-01-25 MED ORDER — MORPHINE BOLUS VIA INFUSION
2.0000 mg | INTRAVENOUS | Status: DC | PRN
  Filled 2017-01-25: qty 2

## 2017-01-25 MED ORDER — SODIUM CHLORIDE 0.9 % IV SOLN: 250.0000 mL | INTRAVENOUS | Status: DC | PRN

## 2017-01-25 MED ORDER — HALOPERIDOL 0.5 MG PO TABS
0.5000 mg | ORAL_TABLET | ORAL | Status: DC | PRN
  Filled 2017-01-25: qty 1

## 2017-01-28 LAB — CULTURE, BLOOD (ROUTINE X 2)
Culture: NO GROWTH
Culture: NO GROWTH
SPECIAL REQUESTS: ADEQUATE
Special Requests: ADEQUATE

## 2017-01-31 ENCOUNTER — Telehealth: Payer: Self-pay | Admitting: Internal Medicine

## 2017-01-31 NOTE — Telephone Encounter (Signed)
° ° °  FYI..  Dr Raliegh Ip might already know  Pt wife called to say pt passed away

## 2017-02-06 NOTE — Telephone Encounter (Signed)
Noted! Thank you

## 2017-02-13 ENCOUNTER — Ambulatory Visit: Payer: Medicare Other | Admitting: Cardiology

## 2017-02-14 NOTE — Death Summary Note (Signed)
DEATH SUMMARY   Patient Details  Name: [name] MRN: [id number] DOB: 11-Aug-1926  Admission/Discharge Information   Admit Date:  2017-02-07  Date of Death: Date of Death: 02-18-2017  Time of Death: Time of Death: 59  Length of Stay: 2022/10/19  Referring Physician: Marletta Lor, MD   Reason(s) for Hospitalization  Sepsis - confusion and dyspnea  Diagnoses  Preliminary cause of death:  Secondary Diagnoses (including complications and co-morbidities):  Principal Problem:   Sepsis ([place]) Active Problems:   Hypothyroidism   HLD (hyperlipidemia)   Essential hypertension   CAD (coronary artery disease)   GERD   Iron deficiency anemia   Chronic anticoagulation   Anemia   Atrial fibrillation with RVR (HCC)   Elevated troponin   Abnormal LFTs   Acute renal failure superimposed on stage 3 chronic kidney disease (HCC)   Anxiety   Acute metabolic encephalopathy   Nausea, vomiting, and diarrhea   Acute respiratory failure with hypoxia (HCC)   Hyponatremia   Acute diastolic heart failure ([place])   Acute on chronic right-sided heart failure (HCC)   SOB (shortness of breath)   Community acquired pneumonia   Dyspnea   Terminal care   Palliative care by specialist   Goals of care, counseling/discussion   Brief Hospital Course (including significant findings, care, treatment, and services provided and events leading to death)  [name] is an 83 y.o. male with a history of CAD s/p PCI, HTN, HLD, hypothyroidism, OSA on CPAP, persistent AFib despite multiple cardioversions on coumadin, iron-deficiency anemia, stage III CKD, and mitral regurgitation who presented on 08-Feb-2023 with confusion and dyspnea. He has had abrupt decline in functional status over the past few months punctuated by a fall outside at home with resultant right tib/fib fracture requiring operative fixation during hospitalization 6/22-6/25 after which he was discharged to SNF. While there, his son noted increasing  confusion and lethargy associated with decreased per oral intake and hypoxia. He was seen in the ED where CXR demonstrated L > R interstitial opacities, WBC elevated at 14.9, LFTs elevated, and creatinine above baseline consistent with AKI due to dehydration. He was admitted for respiratory failure, started on broad spectrum antibiotics for concern of pneumonia. Cardiology was consulted 7/6 for AFib with RVR. Echo /3 showed preserved EF, severe LAE, moderate RAE and RVE with mildly reduced RV systolic function. Diuresis was limited by renal impairment. Unfortunately, despite diuresing >20lbs, he continued to have crackles and significant respiratory distress requiring HFNC and NIPPV. Repeat CXR 7/10 demonstrated possible increase in upper lobe opacities, so zosyn was restarted. Unfortunately, the patient's respiratory distress continued to worsen, with progression of air hunger and intermittent agitation, as well as a couple of apneic episodes overnight into 19-Feb-2023. Discussions with the family regarding end of life care were ongoing. The patient, wife, and son confirmed his wish to be DNR, and further desired to transition to comfort measures only in the early morning of February 19, 2023. Morphine infusion was started and titrated and the patient died shortly thereafter with family at the bedside.  Pertinent Labs and Studies  Significant Diagnostic Studies Dg Chest 2 View  Result Date: 01/22/2017 CLINICAL DATA:  CHF, shortness of Breath EXAM: CHEST  2 VIEW COMPARISON:  01/19/2017 FINDINGS: Stable cardiomegaly and diffuse interstitial opacities with superimposed airspace opacities in the upper lobes. No change since prior study. No effusions. IMPRESSION: No significant change diffuse interstitial and bilateral upper lobe airspace opacities. This could represent edema or infection. Electronically Signed  By: Rolm Baptise M.D.   On: 01/22/2017 10:44   Dg Ankle Complete Right  Result Date: 01/06/2017 CLINICAL DATA:  ORIF  right ankle fractures EXAM: DG C-ARM 61-120 MIN; RIGHT ANKLE - COMPLETE 3+ VIEW COMPARISON:  01/05/2017 right ankle radiographs FINDINGS: Fluoroscopy time 0 minutes 38 seconds. Multiple nondiagnostic spot fluoroscopic intraoperative right ankle radiographs demonstrate postsurgical changes from transfixation of the right lateral malleolus fracture with lateral surgical plate and multiple interlocking screws and transfixation of the medial malleolus fracture with 2 screws. IMPRESSION: Intraoperative fluoroscopic guidance for ORIF bimalleolar right ankle fractures. Electronically Signed   By: Ilona Sorrel M.D.   On: 01/06/2017 11:57   Dg Ankle Complete Right  Result Date: 01/05/2017 CLINICAL DATA:  Recent fall with ankle pain and swelling, initial encounter EXAM: RIGHT ANKLE - COMPLETE 3+ VIEW COMPARISON:  None. FINDINGS: Transverse fractures are noted through the lateral and medial malleolus with some lateral displacement of the distal fracture fragments and lateral displacement of the talus with respect to the distal tibia. No posterior malleolar fracture is seen. The talus and calcaneus appear within normal limits. IMPRESSION: Distal tibial and fibular fractures with lateral displacement of the distal fracture fragments as well as the talus with respect to the more proximal tibia. Electronically Signed   By: Inez Catalina M.D.   On: 01/05/2017 16:05   Ct Head Wo Contrast  Result Date: 01/15/2017 CLINICAL DATA:  Acute onset of encephalopathy. Altered mental status. Patient on Coumadin. Initial encounter. EXAM: CT HEAD WITHOUT CONTRAST TECHNIQUE: Contiguous axial images were obtained from the base of the skull through the vertex without intravenous contrast. COMPARISON:  CT of the head performed 03/22/2007 FINDINGS: Brain: No evidence of acute infarction, hemorrhage, hydrocephalus, extra-axial collection or mass lesion/mass effect. Prominence of the ventricles and sulci reflects moderate cortical volume loss.  Cerebellar atrophy is noted. Scattered periventricular white matter change likely reflects small vessel ischemic microangiopathy. The brainstem and fourth ventricle are within normal limits. The basal ganglia are unremarkable in appearance. The cerebral hemispheres demonstrate grossly normal gray-white differentiation. No mass effect or midline shift is seen. Vascular: No hyperdense vessel or unexpected calcification. Skull: There is no evidence of fracture; visualized osseous structures are unremarkable in appearance. Sinuses/Orbits: The orbits are within normal limits. The paranasal sinuses and mastoid air cells are well-aerated. Other: No significant soft tissue abnormalities are seen. IMPRESSION: 1. No acute intracranial pathology seen on CT. 2. Moderate cortical volume loss and scattered small vessel ischemic microangiopathy. Electronically Signed   By: Garald Balding M.D.   On: 01/15/2017 04:08   US Abdomen Complete  Result Date: 01/15/2017 CLINICAL DATA:  83 y/o  M; abnormal liver function. EXAM: ABDOMEN ULTRASOUND COMPLETE COMPARISON:  11/20/2009 CT abdomen and pelvis. FINDINGS: Gallbladder: No gallstones or wall thickening visualized. No sonographic Murphy sign noted by sonographer. Common bile duct: Diameter: 6 mm Liver: No focal lesion identified. Within normal limits in parenchymal echogenicity. IVC: No abnormality visualized. Pancreas: Visualized portion unremarkable. Spleen: Size and appearance within normal limits. Right Kidney: Length: 10.8 cm. Anechoic well-circumscribed cyst within the lower pole of the kidney measuring 6.8 cm. No septation or solid components identified. Left Kidney: Length: 13.4 cm. Anechoic well-circumscribed cyst within the interpolar region measuring 9.3 cm. No septations or solid components identified. Abdominal aorta: No aneurysm visualized. Other findings: Right pleural effusion. IMPRESSION: 1. Small right pleural effusion. 2. Large simple renal cysts as seen on prior CT  of abdomen and pelvis. 3. Otherwise unremarkable abdominal sonogram. Electronically Signed  By: Kristine Garbe M.D.   On: 01/15/2017 02:19   Dg Chest Port 1 View  Result Date: 01/23/2017 CLINICAL DATA:  PICC line placement EXAM: PORTABLE CHEST 1 VIEW COMPARISON:  Chest x-ray from earlier same day. FINDINGS: Right-sided PICC line has been placed with tip well-positioned at the level of the lower SVC/ cavoatrial junction. Again noted is diffuse bilateral airspace disease, unchanged in the short-term interval. No pleural effusion or pneumothorax seen. Cardiomegaly is stable. Atherosclerotic changes noted at the aortic arch. IMPRESSION: 1. Right-sided PICC line placement with tip well-positioned at the level of the lower SVC/cavoatrial junction. 2. Diffuse bilateral airspace disease, unchanged in the short-term interval, most compatible with pneumonia and/or edema. 3. Aortic atherosclerosis. 4. Cardiomegaly. Electronically Signed   By: Franki Cabot M.D.   On: 01/23/2017 17:57   Dg Chest Port 1 View  Result Date: 01/23/2017 CLINICAL DATA:  Worsening shortness of breath EXAM: PORTABLE CHEST 1 VIEW COMPARISON:  Chest x-ray of 01/22/2017 FINDINGS: There has been worsening of diffuse airspace disease. There is cardiomegaly present but no definite pleural effusion is seen. These findings could be due to diffuse pneumonia or edema or a combination of those two entities. No bony abnormality is evident other than degenerative change in the thoracic spine. IMPRESSION: 1. Worsening of diffuse airspace disease.  Pneumonia versus edema. 2. Stable cardiomegaly Electronically Signed   By: Ivar Drape M.D.   On: 01/23/2017 11:36   Dg Chest Port 1 View  Result Date: 01/19/2017 CLINICAL DATA:  Increasing shortness of breath. EXAM: PORTABLE CHEST 1 VIEW COMPARISON:  Most recent radiograph 01/16/2017.  Also 01/19/2017 FINDINGS: Stable cardiomegaly. Diffuse increased interstitial opacities, asymmetric and greater on  the left, unchanged from prior exam. Suspect small pleural effusion. No confluent airspace disease. No pneumothorax. IMPRESSION: Cardiomegaly. Unchanged diffuse increased interstitial opacities, left greater than right, favor interstitial edema over atypical infection. Possible small left pleural effusion. Electronically Signed   By: Jeb Levering M.D.   On: 01/19/2017 02:00   Dg Chest Port 1 View  Result Date: 01/16/2017 CLINICAL DATA:  Shortness of breath and cough EXAM: PORTABLE CHEST 1 VIEW COMPARISON:  Portable chest x-ray of January 14, 2017 as well as PA and lateral chest x-rays of July 21, 2015 and March 20, 2014 FINDINGS: The lungs are well-expanded. The interstitial markings are diffusely increased and unchanged since yesterday's study. There is no alveolar infiltrate or pleural effusion. The cardiac silhouette remains enlarged and the pulmonary vascularity engorged. No significant pleural effusion is observed. IMPRESSION: CHF with diffuse interstitial edema. Diffuse pneumonia is felt less likely. There has not been significant change since yesterday's study. Electronically Signed   By: David  Martinique M.D.   On: 01/16/2017 07:30   Dg Chest Port 1 View  Result Date: 02/04/2017 CLINICAL DATA:  83 year old male with worsening shortness of breath for 4 days. Right ankle surgery 2 weeks ago. Lethargy. Vomiting. EXAM: PORTABLE CHEST 1 VIEW COMPARISON:  Chest radiographs 07/21/2015. FINDINGS: Portable AP upright view at 1827 hours. Mildly lower lung volumes. Chronic moderate cardiomegaly and tortuous thoracic aorta. Mediastinal contours appear stable. Calcified aortic atherosclerosis. Visualized tracheal air column is within normal limits. Chronic but increased bilateral pulmonary interstitial opacity, worse on the left. No pneumothorax, pleural effusion or consolidation. IMPRESSION: 1. Chronic but increased pulmonary interstitial opacity, worse on the left. Top differential considerations include  asymmetric pulmonary edema and viral/atypical respiratory infection. 2. Moderate cardiomegaly appears stable since 2017. Electronically Signed   By: Herminio Heads.D.  On: 01/22/2017 18:40   Dg Ankle Right Port  Result Date: 01/22/2017 CLINICAL DATA:  Post op ORIF x 3 weeks ago and pt still having lots of pain EXAM: PORTABLE RIGHT ANKLE - 2 VIEW COMPARISON:  01/06/2017 and previous FINDINGS: Cast material obscures fine detail. Stable plate and screw fixation of the distal fibular shaft, and cannulated screw fixation of the medial malleolus, hardware intact without surrounding lucency. Fracture fragments in near anatomic alignment. No acute findings. IMPRESSION: 1. Stable appearance post bimalleolar ORIF. Electronically Signed   By: Lucrezia Europe M.D.   On: 01/22/2017 15:54   Dg Swallowing Func-speech Pathology  Result Date: 01/15/2017 Objective Swallowing Evaluation: Type of Study: MBS-Modified Barium Swallow Study Patient Details Name: [name] MRN: [id number] Date of Birth: November 12, 1926 Today's Date: 01/15/2017 Time: SLP Start Time (ACUTE ONLY): 1305-SLP Stop Time (ACUTE ONLY): 1330 SLP Time Calculation (min) (ACUTE ONLY): 25 min Past Medical History: Past Medical History: Diagnosis Date . Anemia  . Barrett's esophagus  . BENIGN PROSTATIC HYPERTROPHY  . Chronic anticoagulation   a. on Coumadin . CORONARY ARTERY DISEASE   a. s/p stent OM 2002. b. s/p BMS 2006 RCA  c. s/p cath 2007. [name] DEPRESSION  . DIVERTICULOSIS, COLON  . GERD  . GYNECOMASTIA, UNILATERAL  . Hematoma of abdominal wall  . History of echocardiogram   a. EF 55% (TEE 06/2014) . HYPERLIPIDEMIA  . HYPERTENSION  . HYPOTHYROIDISM  . Mallory - Weiss tear   a. > 5 years ago . Mitral regurgitation   a. Mild-mod by TEE 06/2014. [name] Paroxysmal A-fib (HCC)   a. chronic amiodarone and coumadin;  b. 05/2013 s/p DCCV. c. 06/2014 s/p TEE/DCCV. [name] Popliteal artery embolism, right ([place])  . SLEEP APNEA   CPAP Past Surgical History: Past Surgical History: Procedure Laterality  Date . cardioversion  8/08 . CARDIOVERSION  09/14/2011  Procedure: CARDIOVERSION;  Surgeon: Josue Hector, MD;  Location: [place];  Service: Cardiovascular;  Laterality: N/A; . CARDIOVERSION N/A 03/23/2014  Procedure: CARDIOVERSION;  Surgeon: Thompson Grayer, MD;  Location: [place];  Service: Cardiovascular;  Laterality: N/A; . CARDIOVERSION N/A 06/22/2014  Procedure: CARDIOVERSION;  Surgeon: [name], MD;  [facility] ENDOSCOPY;  Service: Cardiovascular;  Laterality: N/A; . CARDIOVERSION N/A 04/27/2015  Procedure: CARDIOVERSION;  Surgeon: Josue Hector, MD;  Location: [facility] ENDOSCOPY;  Service: Cardiovascular;  Laterality: N/A; . CARDIOVERSION N/A 07/16/2015  Procedure: CARDIOVERSION;  Surgeon: [name], MD;  Location: [place];  Service: Cardiovascular;  Laterality: N/A; . CARDIOVERSION N/A 11/26/2015  Procedure: CARDIOVERSION;  Surgeon: Sanda Klein, MD;  Location: [facility] ENDOSCOPY;  Service: Cardiovascular;  Laterality: N/A; . CARDIOVERSION N/A 05/17/2016  Procedure: CARDIOVERSION;  Surgeon: Thayer Headings, MD;  Location: [place];  Service: Cardiovascular;  Laterality: N/A; . CARDIOVERSION N/A 09/20/2016  Procedure: CARDIOVERSION;  Surgeon: [name], MD;  Location: [facility] ENDOSCOPY;  Service: Cardiovascular;  Laterality: N/A; . CARDIOVERSION N/A 11/09/2016  Procedure: CARDIOVERSION;  Surgeon: [name], MD;  Location: [facility] ENDOSCOPY;  Service: Cardiovascular;  Laterality: N/A; . CORONARY ANGIOPLASTY WITH STENT PLACEMENT   . ESOPHAGOGASTRODUODENOSCOPY   . ORIF ANKLE FRACTURE Right 01/06/2017  Procedure: OPEN REDUCTION INTERNAL FIXATION (ORIF) ANKLE FRACTURE, RIGHT;  Surgeon: Mcarthur Rossetti, MD;  Location: [place];  Service: Orthopedics;  Laterality: Right; . TEE WITH CARDIOVERSION  7/08 . TEE WITHOUT CARDIOVERSION  09/14/2011  Procedure: TRANSESOPHAGEAL ECHOCARDIOGRAM (TEE);  Surgeon: Josue Hector, MD;  Location: [facility] ENDOSCOPY;  Service: Cardiovascular;  Laterality: N/A; . TEE  WITHOUT CARDIOVERSION  N/A 06/22/2014  Procedure: TRANSESOPHAGEAL ECHOCARDIOGRAM (TEE);  Surgeon: [name], MD;  Location: [facility] ENDOSCOPY;  Service: Cardiovascular;  Laterality: N/A; . TEE WITHOUT CARDIOVERSION N/A 05/17/2016  Procedure: TRANSESOPHAGEAL ECHOCARDIOGRAM (TEE);  Surgeon: Thayer Headings, MD;  Location: [place];  Service: Cardiovascular;  Laterality: N/A; . TEE WITHOUT CARDIOVERSION N/A 09/20/2016  Procedure: TRANSESOPHAGEAL ECHOCARDIOGRAM (TEE);  Surgeon: [name], MD;  Location: [facility] ENDOSCOPY;  Service: Cardiovascular;  Laterality: N/A; . TRANSURETHRAL RESECTION OF PROSTATE   HPI: Pt is an 83 y.o. male with PMH of Hypertension, hyperlipidemia, GERD, hypothyroidism, depression, anxiety, OSA on CPAP, A. fib on Coumadin, popliteal artery embolism, MVvalve regurgitation, CAD, s/p of stent placement, BPH, iron deficiency anemia, CKD-3, who presented to ED 7/1 with confusion, cough, shortness breath, nausea, vomiting and diarrhea. Pt recently hospitalized for right tibia/fibular fracture from 6/22-6/25. Patient had surgery, and was discharged to rehabilitation facility. Per his son, patient become confused in the past 3 days. He is lethargic, and has poor appetite and decreased oral intake, generalized weakness, but no unilateral weakness, numbness or tingling to extremities. Patient has dry cough and shortness of breath, but no chest pain. Per his son. Pt has oxygen desaturation to 60-70%. He has fever and chills. Patient hasnausea and vomiting. CXR showed "chronic but increased pulmonary interstitial opacity, worse on the left. Top differential considerations include asymmetric pulmonary edema and viral/atypical respiratory infection." Abdominal CT showed R pleural effusion. Head CT negative for acute findings. A RN swallow screen was completed; pt had difficulty with water via straw along with fine crackles. Bedside swallow eval ordered.   No Data Recorded Assessment / Plan /  Recommendation CHL IP CLINICAL IMPRESSIONS 01/15/2017 Clinical Impression Pt with oropharyngeal swallow within functional limits; no penetration or aspiration observed, no delay in swallow initiation. Pt chewed up barium tablet; esophageal sweep revealed stasis of material in the mid-lower esophagus. Pt has hx of Barrett's esophagus and GERD, along with decreased respiratory status, which increases risk of aspiration. Recommend advancing diet to regular/ thin liquids with reflux precautions- sit upright during and after meals, small bites/ sips, alternate food with liquid. SLP will f/u x1 for diet tolerance. Pt may benefit from further esophageal assessment.  SLP Visit Diagnosis Dysphagia, unspecified (R13.10) Attention and concentration deficit following -- Frontal lobe and executive function deficit following -- Impact on safety and function Mild aspiration risk;Moderate aspiration risk   CHL IP TREATMENT RECOMMENDATION 01/15/2017 Treatment Recommendations Therapy as outlined in treatment plan below   No flowsheet data found. CHL IP DIET RECOMMENDATION 01/15/2017 SLP Diet Recommendations Regular solids;Thin liquid Liquid Administration via Cup;Straw Medication Administration Whole meds with liquid Compensations Slow rate;Small sips/bites;Follow solids with liquid Postural Changes Remain semi-upright after after feeds/meals (Comment);Seated upright at 90 degrees   CHL IP OTHER RECOMMENDATIONS 01/15/2017 Recommended Consults Consider esophageal assessment Oral Care Recommendations Oral care BID Other Recommendations --   CHL IP FOLLOW UP RECOMMENDATIONS 01/15/2017 Follow up Recommendations None   CHL IP FREQUENCY AND DURATION 01/15/2017 Speech Therapy Frequency (ACUTE ONLY) min 1 x/week Treatment Duration 1 week      CHL IP ORAL PHASE 01/15/2017 Oral Phase WFL Oral - Pudding Teaspoon -- Oral - Pudding Cup -- Oral - Honey Teaspoon -- Oral - Honey Cup -- Oral - Nectar Teaspoon -- Oral - Nectar Cup -- Oral - Nectar Straw -- Oral -  Thin Teaspoon -- Oral - Thin Cup -- Oral - Thin Straw -- Oral - Puree -- Oral - Mech Soft -- Oral - Regular --  Oral - Multi-Consistency -- Oral - Pill -- Oral Phase - Comment --  CHL IP PHARYNGEAL PHASE 01/15/2017 Pharyngeal Phase WFL Pharyngeal- Pudding Teaspoon -- Pharyngeal -- Pharyngeal- Pudding Cup -- Pharyngeal -- Pharyngeal- Honey Teaspoon -- Pharyngeal -- Pharyngeal- Honey Cup -- Pharyngeal -- Pharyngeal- Nectar Teaspoon -- Pharyngeal -- Pharyngeal- Nectar Cup -- Pharyngeal -- Pharyngeal- Nectar Straw -- Pharyngeal -- Pharyngeal- Thin Teaspoon -- Pharyngeal -- Pharyngeal- Thin Cup -- Pharyngeal -- Pharyngeal- Thin Straw -- Pharyngeal -- Pharyngeal- Puree -- Pharyngeal -- Pharyngeal- Mechanical Soft -- Pharyngeal -- Pharyngeal- Regular -- Pharyngeal -- Pharyngeal- Multi-consistency -- Pharyngeal -- Pharyngeal- Pill -- Pharyngeal -- Pharyngeal Comment --  CHL IP CERVICAL ESOPHAGEAL PHASE 01/15/2017 Cervical Esophageal Phase (No Data) Pudding Teaspoon -- Pudding Cup -- Honey Teaspoon -- Honey Cup -- Nectar Teaspoon -- Nectar Cup -- Nectar Straw -- Thin Teaspoon -- Thin Cup -- Thin Straw -- Puree -- Mechanical Soft -- Regular -- Multi-consistency -- Pill -- Cervical Esophageal Comment -- No flowsheet data found. Amy Dionicia Abler, MA, CCC-SLP 01/15/2017, 1:40 PM [id number]             Dg C-arm 1-60 Min  Result Date: 01/06/2017 CLINICAL DATA:  ORIF right ankle fractures EXAM: DG C-ARM 61-120 MIN; RIGHT ANKLE - COMPLETE 3+ VIEW COMPARISON:  01/05/2017 right ankle radiographs FINDINGS: Fluoroscopy time 0 minutes 38 seconds. Multiple nondiagnostic spot fluoroscopic intraoperative right ankle radiographs demonstrate postsurgical changes from transfixation of the right lateral malleolus fracture with lateral surgical plate and multiple interlocking screws and transfixation of the medial malleolus fracture with 2 screws. IMPRESSION: Intraoperative fluoroscopic guidance for ORIF bimalleolar right ankle fractures. Electronically  Signed   By: Ilona Sorrel M.D.   On: 01/06/2017 11:57    Microbiology Recent Results (from the past 240 hour(s))  Culture, blood (routine x 2)     Status: None (Preliminary result)   Collection Time: 01/23/17  2:30 PM  Result Value Ref Range Status   Specimen Description BLOOD RIGHT ANTECUBITAL  Final   Special Requests   Final    BOTTLES DRAWN AEROBIC AND ANAEROBIC Blood Culture adequate volume   Culture NO GROWTH 4 DAYS  Final   Report Status PENDING  Incomplete  Culture, blood (routine x 2)     Status: None (Preliminary result)   Collection Time: 01/23/17  2:30 PM  Result Value Ref Range Status   Specimen Description BLOOD RIGHT HAND  Final   Special Requests   Final    BOTTLES DRAWN AEROBIC AND ANAEROBIC Blood Culture adequate volume   Culture NO GROWTH 4 DAYS  Final   Report Status PENDING  Incomplete    Lab Basic Metabolic Panel:  Recent Labs Lab 01/21/17 0343 01/22/17 0306 01/23/17 0307 01/24/17 0356 02-15-2017 0504  NA 134* 133* 132* 131* 129*  K 4.5 5.2* 4.8 4.5 4.7  CL 98* 100* 99* 98* 95*  CO2 26 26 24 24 26   GLUCOSE 113* 137* 114* 118* 129*  BUN 16 19 28* 29* 35*  CREATININE 1.19 1.27* 1.37* 1.27* 1.12  CALCIUM 8.5* 8.4* 8.4* 8.4* 8.5*  MG  --   --   --  2.0  --    Liver Function Tests:  Recent Labs Lab 01/21/17 0343 01/24/17 0356  AST 31 29  ALT 60 32  ALKPHOS 104 112  BILITOT 1.3* 1.5*  PROT 5.8* 5.8*  ALBUMIN 2.2* 2.0*   CBC:  Recent Labs Lab 01/21/17 0343 01/23/17 0307 01/24/17 0356 02-15-17 0504  WBC 13.3* 14.0* 16.5* 18.8*  NEUTROABS  --   --  14.4*  --   HGB 10.6* 9.5* 10.0* 7.7*  HCT 32.0* 28.3* 29.3* 22.8*  MCV 91.4 91.6 90.2 90.1  PLT 371 412* 430* 511*   Sepsis Labs:  Recent Labs Lab 01/21/17 0343 01/23/17 0307 01/24/17 0356 2017-02-06 0504  PROCALCITON  --   --   --  0.28  WBC 13.3* 14.0* 16.5* 18.8*    Procedures/Operations   PICC insertion 7/10 RUE dual lumen   Vance Gather 01/27/2017, 4:31 PM

## 2017-02-14 NOTE — Progress Notes (Signed)
Chaplain presented to the patient's room, family was present and stated they wanted to spent quality time with the patient at this  time. Chaplain will follow up as needed. Ovidio Hanger [phone]

## 2017-02-14 NOTE — Progress Notes (Signed)
PROGRESS NOTE  [name]  GGY:[id number] DOB: Sep 04, 1926 DOA: 02/05/2017 PCP: Marletta Lor, MD   Brief Narrative: [name] is an 83 y.o. male with a history of CAD s/p PCI, HTN, HLD, hypothyroidism, OSA on CPAP, persistent AFib despite multiple cardioversions on coumadin, iron-deficiency anemia, stage III CKD, and mitral regurgitation who presented on 7/1 with confusion and dyspnea. He has had abrupt decline in functional status over the past few months punctuated by a fall outside at home with resultant right tib/fib fracture requiring operative fixation during hospitalization 6/22-6/25 after which he was discharged to SNF. While there, his son noted increasing confusion and lethargy associated with decreased per oral intake and hypoxia. He was seen in the ED where CXR demonstrated L > R interstitial opacities, WBC elevated at 14.9, LFTs elevated, and creatinine above baseline consistent with AKI due to dehydration. He was admitted for respiratory failure, started on broad spectrum antibiotics for concern of pneumonia. Cardiology was consulted 7/6 for AFib with RVR. Echo /3 showed preserved EF, severe LAE, moderate RAE and RVE with mildly reduced RV systolic function. Diuresis was limited by renal impairment. Unfortunately, despite diuresing >20lbs, he continues to have crackles and significant respiratory distress requiring HFNC and NIPPV. Repeat CXR 7/10 demonstrated possible increase in upper lobe opacities, so zosyn was restarted. Unfortunately, the patient's respiratory distress continued to worsen, with progression of air hunger and intermittent agitation, as well as a couple of apneic episodes overnight into 7/12. Discussions with the family regarding end of life care have been ongoing, and they are amenable to discussions with palliative care. They patient, wife, and son have confirmed his wish to be DNR, and further desire comfort measures only. I have started morphine gtt and prn  ativan, discontinued antibiotics and monitoring. Anticipate death in the next hours to days.   Assessment & Plan: Principal Problem:   Sepsis ([place]) Active Problems:   Hypothyroidism   HLD (hyperlipidemia)   Essential hypertension   CAD (coronary artery disease)   GERD   Iron deficiency anemia   Chronic anticoagulation   Anemia   Atrial fibrillation with RVR (HCC)   Elevated troponin   Abnormal LFTs   Acute renal failure superimposed on stage 3 chronic kidney disease (HCC)   Anxiety   Acute metabolic encephalopathy   Nausea, vomiting, and diarrhea   Acute respiratory failure with hypoxia (HCC)   Hyponatremia   Acute diastolic heart failure (HCC)   Acute on chronic right-sided heart failure (HCC)   SOB (shortness of breath)   Community acquired pneumonia  Acute hypoxic respiratory failure: Due to pulmonary edema due to acute on chronic diastolic CHF, AFib with RVR, and likely pneumonia. - Continue supplemental oxygen prn hypoxia and CPAP prn respiratory distress.  - Titrate doses of morphine and ativan prn air hunger/distress.  - DC fluids, diuretics, abx for comfort care.   Acute on chronic diastolic CHF: Has maintained net negative balance and still with crackles on exam with respiratory distress.  - DC I/O and weights and labs. DC CVP monitoring.   Permanent atrial fibrillation: Refractory to DCCV x15 in past 3 years. Severely dilated atria do not bode well for further rhythm control efforts.  - Cardiology was considering AV nodal ablation and PPM, though risk was prohibitive.  - DC diltiazem and coumadin  HCAP: Leukocytosis worsening despite antibiotics and growing thrombocytosis, with tachypnea, tachycardia and temperature instability, suspect sepsis - Will stop antibiotics due to family discussions wishing for comfort care.  -  Monitor follow up blood cultures (initial Cx's negative)  Elevated LFTs: Resolved, suspect due to congestive hepatopathy vs. transient  ischemia. Acute viral panel negative. U/S negative for GB disease.  - No further labs  Hyponatremia: Suspect due to cardiac disease. Stable, mild. - No further labs  Right tibia/fibula fracture: s/p fixation during last hospitalization. Wound healing routinely per orthopedics.  - Appreciate orthopedics assistance, pain control as above  HTN: Currently hypotensive despite stopping doxazosin.  - No need to check BP or continuous monitoring.  CAD: No chest pain - Hold medications  OSA: Chronic, stable - Continue CPAP prn  Hypothyroidism: Chronic, stable. TSH 0.789.  - Home dose of synthroid (81mcg/75mcg alternating) changed to 52mcg daily earlier this admission.  DVT prophylaxis: Coumadin Code Status: DNR confirmed Family Communication: Wife, Daughter, and Son at bedside Disposition Plan: Acute decompensation, planning palliative care consult discussions. Does not appear stable for transfer and thus would anticipate inpatient death.   Consultants:   Cardiology  Orthopedics  Palliative care  Procedures:   PICC insertion 7/10 RUE dual lumen  Antimicrobials:  Vancomycin, zosyn 7/1 - 7/5  Zosyn 7/10 -7/12  Subjective: Pt restless over past 24 hours, improved modestly with morphine gtt. Had periods of apnea so family was called back to bedside and continues to be in respiratory distress.   Objective: Vitals:   01/24/17 2348 07-Feb-2017 0353 02/07/2017 0500 Feb 07, 2017 0732  BP: 104/70 (!) 101/59    Pulse: (!) 102 94  92  Resp: 18 20  20   Temp: (!) 97.5 F (36.4 C) (!) 97.5 F (36.4 C)  (!) 97.2 F (36.2 C)  TempSrc: Axillary Axillary  Axillary  SpO2: 99% 100%  99%  Weight:   78.1 kg (172 lb 1.6 oz)   Height:        Intake/Output Summary (Last 24 hours) at 02-07-17 0826 Last data filed at 07-Feb-2017 0501  Gross per 24 hour  Intake            672.2 ml  Output              300 ml  Net            372.2 ml   Filed Weights   01/23/17 0350 01/24/17 0318 Feb 07, 2017 0500    Weight: 79.4 kg (175 lb) 79.8 kg (176 lb) 78.1 kg (172 lb 1.6 oz)    Examination: General exam: Elderly dyspneic male in distress Respiratory system: Tachypneic, labored on NRB. Crackles.   Cardiovascular system: Irregular tachycardia. No murmur, rub, or gallop. No JVD, and 1+ pedal edema. Gastrointestinal system: Abdomen soft, non-tender, non-distended, +BS.  Central nervous system: No focal neurological deficits. Extremities: Warm, no deformities. RLE bruising stable.  Skin: No rashes, lesions no ulcers. Right rib bruising stable.  Psychiatry: Judgement and insight appear impaired by agitation.  Data Reviewed: I have personally reviewed following labs and imaging studies  CBC:  Recent Labs Lab 01/20/17 0222 01/21/17 0343 01/23/17 0307 01/24/17 0356 Feb 07, 2017 0504  WBC 13.2* 13.3* 14.0* 16.5* 18.8*  NEUTROABS  --   --   --  14.4*  --   HGB 10.4* 10.6* 9.5* 10.0* 7.7*  HCT 30.8* 32.0* 28.3* 29.3* 22.8*  MCV 91.1 91.4 91.6 90.2 90.1  PLT 335 371 412* 430* 751*   Basic Metabolic Panel:  Recent Labs Lab 01/18/17 1415 01/18/17 1853 01/19/17 0356  01/21/17 0343 01/22/17 0306 01/23/17 0307 01/24/17 0356 02/07/17 0504  NA  --   --  137  < > 134*  133* 132* 131* 129*  K 3.0* 2.9* 3.7  < > 4.5 5.2* 4.8 4.5 4.7  CL  --   --  103  < > 98* 100* 99* 98* 95*  CO2  --   --  24  < > 26 26 24 24 26   GLUCOSE  --   --  117*  < > 113* 137* 114* 118* 129*  BUN  --   --  17  < > 16 19 28* 29* 35*  CREATININE  --   --  1.24  < > 1.19 1.27* 1.37* 1.27* 1.12  CALCIUM  --   --  8.3*  < > 8.5* 8.4* 8.4* 8.4* 8.5*  MG 1.7 2.2 2.2  --   --   --   --  2.0  --   < > = values in this interval not displayed. GFR: Estimated Creatinine Clearance: 49.5 mL/min (by C-G formula based on SCr of 1.12 mg/dL). Liver Function Tests:  Recent Labs Lab 01/21/17 0343 01/24/17 0356  AST 31 29  ALT 60 32  ALKPHOS 104 112  BILITOT 1.3* 1.5*  PROT 5.8* 5.8*  ALBUMIN 2.2* 2.0*   No results for  input(s): LIPASE, AMYLASE in the last 168 hours. No results for input(s): AMMONIA in the last 168 hours. Coagulation Profile:  Recent Labs Lab 01/21/17 0343 01/22/17 0306 01/23/17 0307 01/24/17 0356 02/09/17 0504  INR 2.65 2.82 3.07 2.93 3.08   Cardiac Enzymes: No results for input(s): CKTOTAL, CKMB, CKMBINDEX, TROPONINI in the last 168 hours. BNP (last 3 results) No results for input(s): PROBNP in the last 8760 hours. HbA1C: No results for input(s): HGBA1C in the last 72 hours. CBG: No results for input(s): GLUCAP in the last 168 hours. Lipid Profile: No results for input(s): CHOL, HDL, LDLCALC, TRIG, CHOLHDL, LDLDIRECT in the last 72 hours. Thyroid Function Tests: No results for input(s): TSH, T4TOTAL, FREET4, T3FREE, THYROIDAB in the last 72 hours. Anemia Panel: No results for input(s): VITAMINB12, FOLATE, FERRITIN, TIBC, IRON, RETICCTPCT in the last 72 hours. Urine analysis:    Component Value Date/Time   COLORURINE AMBER (A) 01/23/2017 0120   APPEARANCEUR CLOUDY (A) 02/08/2017 0120   LABSPEC 1.024 01/24/2017 0120   PHURINE 5.0 01/31/2017 0120   GLUCOSEU NEGATIVE 01/23/2017 0120   HGBUR NEGATIVE 02/12/2017 0120   BILIRUBINUR SMALL (A) 02/13/2017 0120   KETONESUR NEGATIVE 02/05/2017 0120   PROTEINUR 30 (A) 02/09/2017 0120   UROBILINOGEN 2.0 (H) 11/10/2009 0122   NITRITE NEGATIVE 02/08/2017 0120   LEUKOCYTESUR NEGATIVE 01/29/2017 0120   Recent Results (from the past 240 hour(s))  Culture, blood (routine x 2)     Status: None (Preliminary result)   Collection Time: 01/23/17  2:30 PM  Result Value Ref Range Status   Specimen Description BLOOD RIGHT ANTECUBITAL  Final   Special Requests   Final    BOTTLES DRAWN AEROBIC AND ANAEROBIC Blood Culture adequate volume   Culture NO GROWTH < 24 HOURS  Final   Report Status PENDING  Incomplete  Culture, blood (routine x 2)     Status: None (Preliminary result)   Collection Time: 01/23/17  2:30 PM  Result Value Ref Range  Status   Specimen Description BLOOD RIGHT HAND  Final   Special Requests   Final    BOTTLES DRAWN AEROBIC AND ANAEROBIC Blood Culture adequate volume   Culture NO GROWTH < 24 HOURS  Final   Report Status PENDING  Incomplete      Radiology Studies: Dg  Chest Port 1 View  Result Date: 01/23/2017 CLINICAL DATA:  PICC line placement EXAM: PORTABLE CHEST 1 VIEW COMPARISON:  Chest x-ray from earlier same day. FINDINGS: Right-sided PICC line has been placed with tip well-positioned at the level of the lower SVC/ cavoatrial junction. Again noted is diffuse bilateral airspace disease, unchanged in the short-term interval. No pleural effusion or pneumothorax seen. Cardiomegaly is stable. Atherosclerotic changes noted at the aortic arch. IMPRESSION: 1. Right-sided PICC line placement with tip well-positioned at the level of the lower SVC/cavoatrial junction. 2. Diffuse bilateral airspace disease, unchanged in the short-term interval, most compatible with pneumonia and/or edema. 3. Aortic atherosclerosis. 4. Cardiomegaly. Electronically Signed   By: Franki Cabot M.D.   On: 01/23/2017 17:57   Dg Chest Port 1 View  Result Date: 01/23/2017 CLINICAL DATA:  Worsening shortness of breath EXAM: PORTABLE CHEST 1 VIEW COMPARISON:  Chest x-ray of 01/22/2017 FINDINGS: There has been worsening of diffuse airspace disease. There is cardiomegaly present but no definite pleural effusion is seen. These findings could be due to diffuse pneumonia or edema or a combination of those two entities. No bony abnormality is evident other than degenerative change in the thoracic spine. IMPRESSION: 1. Worsening of diffuse airspace disease.  Pneumonia versus edema. 2. Stable cardiomegaly Electronically Signed   By: Ivar Drape M.D.   On: 01/23/2017 11:36    Scheduled Meds: . clonazePAM  1 mg Oral QHS  . diltiazem  120 mg Oral Q12H  . finasteride  5 mg Oral Daily  . levothyroxine  50 mcg Oral QAC breakfast  . megestrol  800 mg Oral  Daily  . sodium chloride flush  10-40 mL Intracatheter Q12H  . traZODone  25 mg Oral QHS   Continuous Infusions: . morphine 4 mg/hr (02-09-17 0809)     LOS: 11 days   Time spent: 35 minutes.  Vance Gather, MD [facility] Pager [phone]  If 7PM-7AM, please contact night-coverage [email or web address] Password [facility] 09-Feb-2017, 8:26 AM

## 2017-02-14 NOTE — Progress Notes (Signed)
Two different times throughout shift patient stopped breathing entirely. Family was called in. Upon arrival, pt son requested comfort measures for his father. On call provider notified and orders given to increase pt comfort. Pt is now resting comfortably without signs of distress. RN will continue to monitor patient and offer support to family.

## 2017-02-14 NOTE — Progress Notes (Signed)
Pt has had three instances where his oxygen sat dropped into high 70's low 80's during the shift, non-rebreather mask was applied as pt was refusing to wear his cpap mask and his O2 sat  increased to ~93. Pt was initially wearing [place] high flow at the start of the shift. Pt seemed to tolerate non-rebreather throughout the shift and O2 sat stayed in low 90's.

## 2017-02-14 NOTE — Consult Note (Signed)
Consultation Note Date: 01/31/17   Patient Name: [name]  DOB: 1927-05-03  MRN: [id number]  Age / Sex: 83 y.o., male  PCP: Marletta Lor, MD Referring Physician: Patrecia Pour, MD  Reason for Consultation: Non pain symptom management, Terminal Care and Withdrawal of life-sustaining treatment  HPI/Patient Profile: 83 y.o. male  with past medical history of HTN, hyperlipidemia, GERD, hypothyroidism, depression, anxiety, OSA on CPAP, MV valve regurgitation, CAD, CKD, persistent a. Fib, iron deficiency anemia, fractured R tib/fib s/p operative fixation (6/22-6/25, dc'd to SNF).admitted on 02/06/2017 with increasing confusion, lethargy and dyspnea.  Patient was found to be in respiratory failure. This admission workup has revealed acute on chronic kidney injury in setting of chronic heart failure and afib with RVR. Hydration for AKI, increased pulmonary congestion, and diuresis for pulmonary congestion was limited due to AKI. There was also concern for pneumonia and broad spectrum antibiotics were started with hopes of improving respiratory status. Unfortunately, respiratory status has continue to decline. Palliative medicine consulted for terminal care.   Clinical Assessment and Goals of Care: Patient in bed, visibly agitated and in respiratory distress. Moaning. Pulling at sheets. On nonrebreather. Stating he feels hot. Denies pain. Patient is actively dying.  I confirmed with patient's son, Louie Casa and patient's daughter, Roanna Epley, that patient's wife also agrees GOC for patient at this point are full comfort. Clarified with family that full comfort means providing medications for comfort and symptoms of SOB, anxiety, pain, agitation or other discomforts and supporting patient through dying process. Medications and other life prolonging measures will be discontinued including telemetry, IV fluids, antibiotics,  cardiac medications and all other life sustaining interventions. Family confirmed their [place] and stated that patient had been asking for this for days, however, they had been reluctant to let him go. They felt like he was suffering and now their main goal is for him not to suffer.     Primary Decision Maker NEXT OF KIN    SUMMARY OF RECOMMENDATIONS  Transition to full comfort measures  Patient appears significantly distressed on 15L NRB. Air hunger continues despite 2mg  bolus IV morphine x 1 and 1mg  IV lorazepam 1.5 hrs ago and continuous infusion- increase bolus to 4mg  IV q15 min prn for comfort, and will order 2mg  IV lorazepam now and q2hr prn for comfort and palliative sedation. Once his air hunger is controlled with opioids and benzodiazepenes, decrease O2 NRB to nasal cannula. I do not expect him to survive over the next few hours.  Haldol .5mg  po, SL or IV q4 hr PRN agitation  Glycopyrrolate .2mg  IV q4hr secretions      Code Status/Advance Care Planning:  DNR    Symptom Management:   As above  Palliative Prophylaxis:   Frequent Pain Assessment Frequent assessment for air hunger  Additional Recommendations (Limitations, Scope, Preferences):  Full Comfort Care  Psycho-social/Spiritual:   Desire for further Chaplaincy support:yes   Prognosis:    Hours - Days  Discharge Planning: Anticipated Hospital Death  Primary Diagnoses: Present on Admission: .  Anemia . Atrial fibrillation with RVR ([place]) . CAD (coronary artery disease) . Essential hypertension . GERD . HLD (hyperlipidemia) . Hypothyroidism . Iron deficiency anemia . Sepsis ([place]) . Elevated troponin . Abnormal LFTs . Acute renal failure superimposed on stage 3 chronic kidney disease ([place]) . Anxiety . Acute metabolic encephalopathy . Nausea, vomiting, and diarrhea . Acute respiratory failure with hypoxia ([place]) . Hyponatremia   I have reviewed the medical record, interviewed the patient and  family, and examined the patient. The following aspects are pertinent.  Past Medical History:  Diagnosis Date  . Anemia   . Barrett's esophagus   . BENIGN PROSTATIC HYPERTROPHY   . Chronic anticoagulation    a. on Coumadin  . CORONARY ARTERY DISEASE    a. s/p stent OM 2002. b. s/p BMS 2006 RCA  c. s/p cath 2007.  [name] DEPRESSION   . DIVERTICULOSIS, COLON   . GERD   . GYNECOMASTIA, UNILATERAL   . Hematoma of abdominal wall   . History of echocardiogram    a. EF 55% (TEE 06/2014)  . HYPERLIPIDEMIA   . HYPERTENSION   . HYPOTHYROIDISM   . [place] - Weiss tear    a. > 5 years ago  . Mitral regurgitation    a. Mild-mod by TEE 06/2014.  [name] Paroxysmal A-fib (HCC)    a. chronic amiodarone and coumadin;  b. 05/2013 s/p DCCV. c. 06/2014 s/p TEE/DCCV.  [name] Popliteal artery embolism, right ([place])   . SLEEP APNEA    CPAP   Social History   Social History  . Marital status: Married    Spouse name: N/A  . Number of children: 1  . Years of education: N/A   Occupational History  . Retired    Social History Main Topics  . Smoking status: Never Smoker  . Smokeless tobacco: Never Used  . Alcohol use No  . Drug use: No  . Sexual activity: Not Asked   Other Topics Concern  . None   Social History Narrative   He is retired from [employer].   Mother lived into her 52's.  Father died in his 36's.  No CAD.   Family History  Problem Relation Age of Onset  . Heart attack Mother   . Hypertension Other   . Heart disease Brother        CAD male 1st degree relative  . Stroke Neg Hx    Scheduled Meds: . LORazepam  2 mg Intravenous NOW  . sodium chloride flush  3 mL Intravenous Q12H   Continuous Infusions: . sodium chloride    . morphine 4 mg/hr (02-20-17 0809)   PRN Meds:.sodium chloride, acetaminophen, antiseptic oral rinse, glycopyrrolate **OR** glycopyrrolate **OR** glycopyrrolate, haloperidol **OR** haloperidol **OR** haloperidol lactate, morphine, ondansetron **OR**  ondansetron (ZOFRAN) IV, polyvinyl alcohol, sodium chloride flush Medications Prior to Admission:  Prior to Admission medications   Medication Sig Start Date End Date Taking? Authorizing Provider  acetaminophen (TYLENOL) 325 MG tablet Take 650 mg by mouth every 6 (six) hours as needed for mild pain.   Yes [provider]  clonazePAM (KLONOPIN) 1 MG tablet Take 1 tablet (1 mg total) by mouth at bedtime. 01/08/17  Yes Reyne Dumas, MD  Coenzyme Q10 (CO Q-10 PO) Take 100 mg by mouth daily.   Yes [provider]  diltiazem (CARDIZEM CD) 180 MG 24 hr capsule Take 1 capsule (180 mg total) by mouth daily. 11/15/16  Yes Camnitz, Will Hassell Done, MD  finasteride (PROSCAR) 5 MG tablet Take  5 mg by mouth daily. 12/22/14  Yes [provider]  fish oil-omega-3 fatty acids 1000 MG capsule Take 1 g by mouth 2 (two) times daily.    Yes [provider]  HYDROcodone-acetaminophen (NORCO/VICODIN) 5-325 MG tablet Take 1-2 tablets by mouth every 4 (four) hours as needed for moderate pain. Patient taking differently: Take 1 tablet by mouth 3 (three) times daily.  01/07/17  Yes Mcarthur Rossetti, MD  hydroxypropyl methylcellulose (ISOPTO TEARS) 2.5 % ophthalmic solution Place 1 drop into both eyes 3 (three) times daily as needed for dry eyes.    Yes [provider]  levothyroxine (SYNTHROID, LEVOTHROID) 50 MCG tablet Take 50-75 mcg by mouth See admin instructions. 15mcg on M W F SUN and 17mcg on T TH SAT   Yes [provider]  Multiple Vitamin (MULTIVITAMIN) capsule Take 1 capsule by mouth daily.   Yes [provider]  nitroGLYCERIN (NITROSTAT) 0.4 MG SL tablet Place 0.4 mg under the tongue every 5 (five) minutes as needed for chest pain.    Yes [provider]  pantoprazole (PROTONIX) 40 MG tablet Take 40 mg by mouth 2 (two) times daily.    Yes [provider]  polyethylene glycol (MIRALAX / GLYCOLAX) packet Take 17 g by mouth daily as needed  for mild constipation. 01/08/17  Yes Reyne Dumas, MD  pravastatin (PRAVACHOL) 40 MG tablet Take 40 mg by mouth every evening. 03/07/11  Yes Imogene Burn, PA-C  saccharomyces boulardii (FLORASTOR) 250 MG capsule Take 250 mg by mouth 2 (two) times daily.   Yes [provider]  traZODone (DESYREL) 50 MG tablet Take 25 mg by mouth at bedtime as needed for sleep.    Yes [provider]  warfarin (COUMADIN) 2 MG tablet Take 2 mg by mouth daily at 6 PM.    Yes [provider]  docusate sodium (COLACE) 100 MG capsule Take 1 capsule (100 mg total) by mouth 2 (two) times daily. Patient not taking: Reported on 01/18/2017 01/08/17   Reyne Dumas, MD   Allergies  Allergen Reactions  . Metoprolol Tartrate Other (See Comments)    Anxiety, heart rate goes too low, feels like in a fog.   Review of Systems  Unable to perform ROS: Acuity of condition    Physical Exam  Constitutional: He appears distressed.  HENT:  Head: Normocephalic and atraumatic.  Cardiovascular:  Tachycardic, peripheral pulses thready  Pulmonary/Chest:  diminished  Neurological:  UTA orientation, does not open eyes  Skin: He is diaphoretic.  Psychiatric:  Agitated, pulling at sheets, moving legs    Vital Signs: BP (!) 101/59 (BP Location: Left Arm)   Pulse 92   Temp (!) 97.2 F (36.2 C) (Axillary)   Resp 20   Ht 5\' 11"  (1.803 m)   Wt 78.1 kg (172 lb 1.6 oz)   SpO2 99%   BMI 24.00 kg/m  Pain Assessment: No/denies pain   Pain Score: 0-No pain   SpO2: SpO2: 99 % O2 Device:SpO2: 99 % O2 Flow Rate: .O2 Flow Rate (L/min): 15 L/min  IO: Intake/output summary:  Intake/Output Summary (Last 24 hours) at 02-18-2017 0942 Last data filed at 2017-02-18 0900  Gross per 24 hour  Intake            672.2 ml  Output              300 ml  Net            372.2 ml  LBM: Last BM Date: 01/20/17 Baseline Weight: Weight: 84.4 kg (186 lb 1.1 oz) Most recent weight: Weight: 78.1 kg (172 lb 1.6 oz)       Palliative Assessment/Data: PPS: 10%     Thank you for this consult. Palliative medicine will continue to follow and assist as needed.   Time Total: 120 minutes Greater than 50%  of this time was spent counseling and coordinating care related to the above assessment and plan.  Signed by: Mariana Kaufman, AGNP-C Palliative Medicine    Please contact Palliative Medicine Team phone at [phone] for questions and concerns.  For individual provider: See Shea Evans

## 2017-02-14 NOTE — Progress Notes (Signed)
Patient passed away at 1019 am , family at bedside. MD notified and came to bedside. [facility] notified, family declined donation. Bed control notified. All patients belongings taken home by step daughter.

## 2017-02-14 DEATH — deceased

## 2017-02-22 ENCOUNTER — Ambulatory Visit: Payer: Medicare Other | Admitting: Cardiology

## 2018-05-07 IMAGING — DX DG CHEST 1V PORT
1 series · 1 of 1 positions shown · non-contrast
Comparison: Most recent radiograph 01/16/2017.  Also 01/14/2017

CLINICAL DATA: Increasing shortness of breath.

EXAM:
PORTABLE CHEST 1 VIEW

[chest ap]
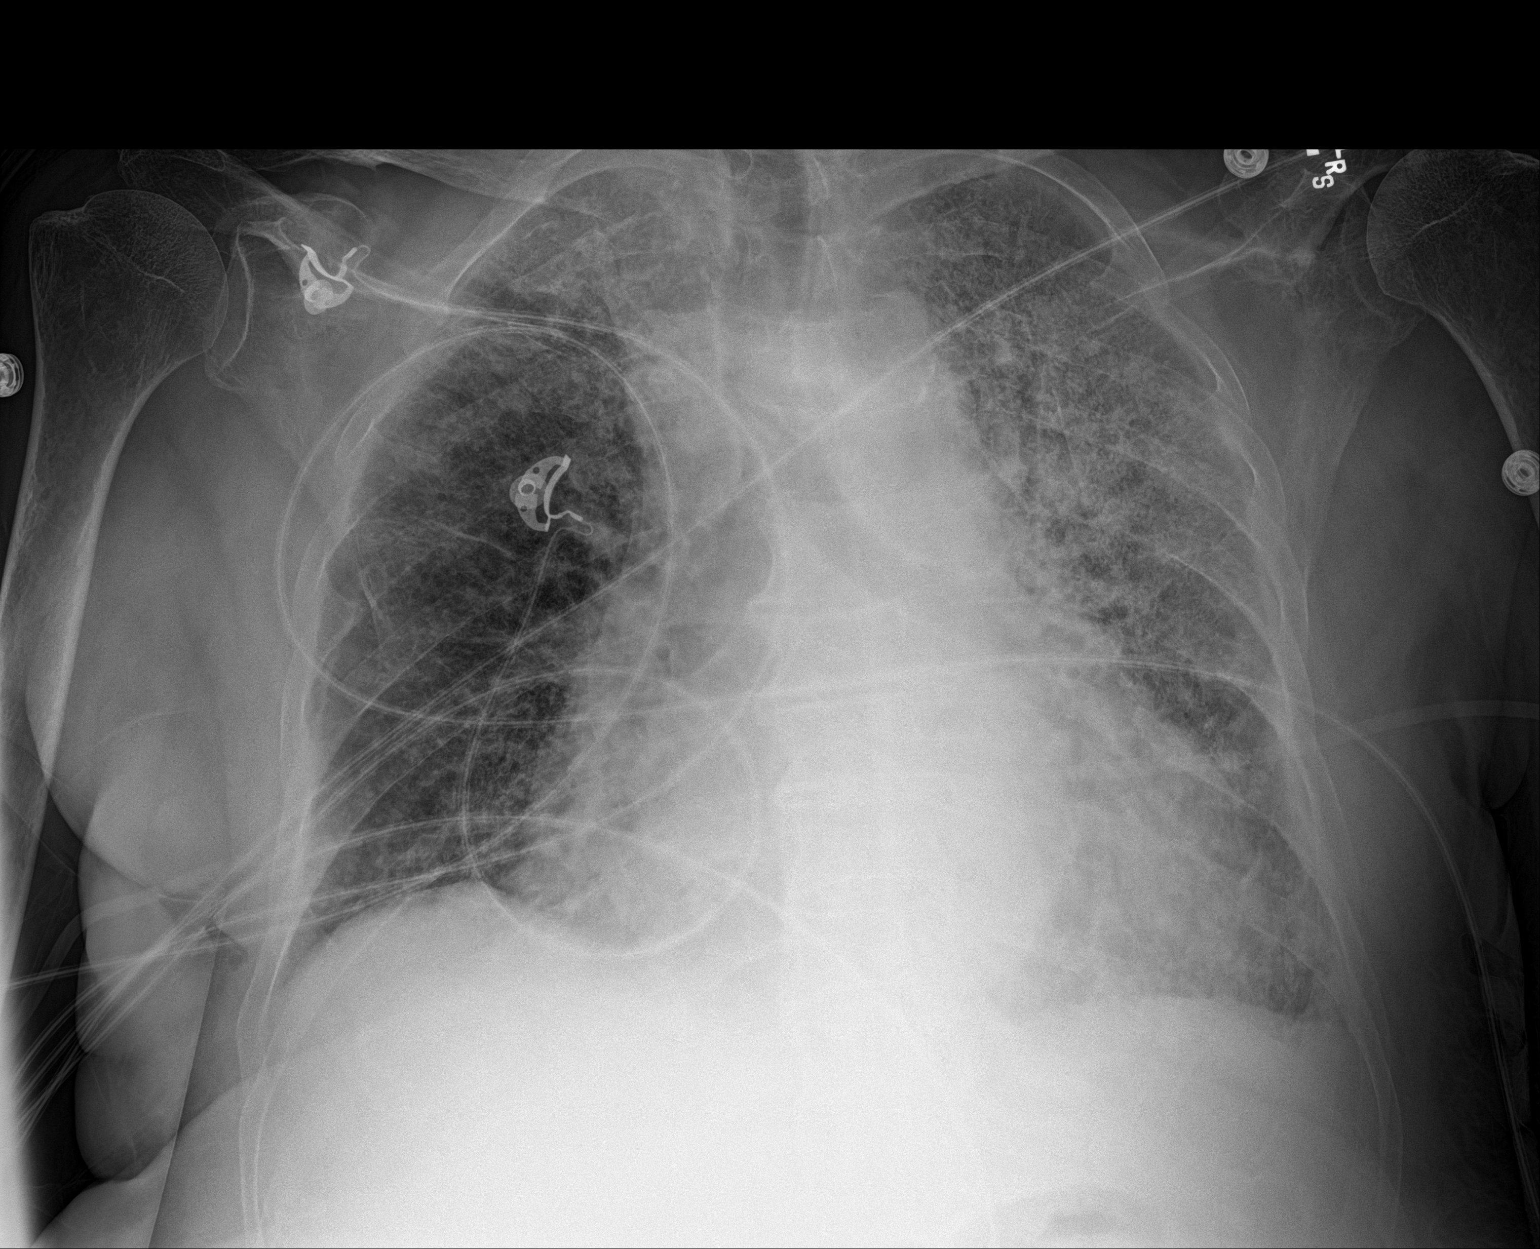

[1 of 1 positions shown; findings below may reference images not displayed]

FINDINGS: Stable cardiomegaly. Diffuse increased interstitial opacities,
asymmetric and greater on the left, unchanged from prior exam.
Suspect small pleural effusion. No confluent airspace disease. No
pneumothorax.
IMPRESSION: Cardiomegaly. Unchanged diffuse increased interstitial opacities,
left greater than right, favor interstitial edema over atypical
infection. Possible small left pleural effusion.

## 2018-05-10 IMAGING — CR DG ANKLE PORT 2V*R*
2 series · 2 of 2 positions shown · non-contrast
Comparison: 01/06/2017 and previous

CLINICAL DATA: Post op ORIF x 3 weeks ago and pt still having lots
of pain

EXAM:
PORTABLE RIGHT ANKLE - 2 VIEW

[AP]
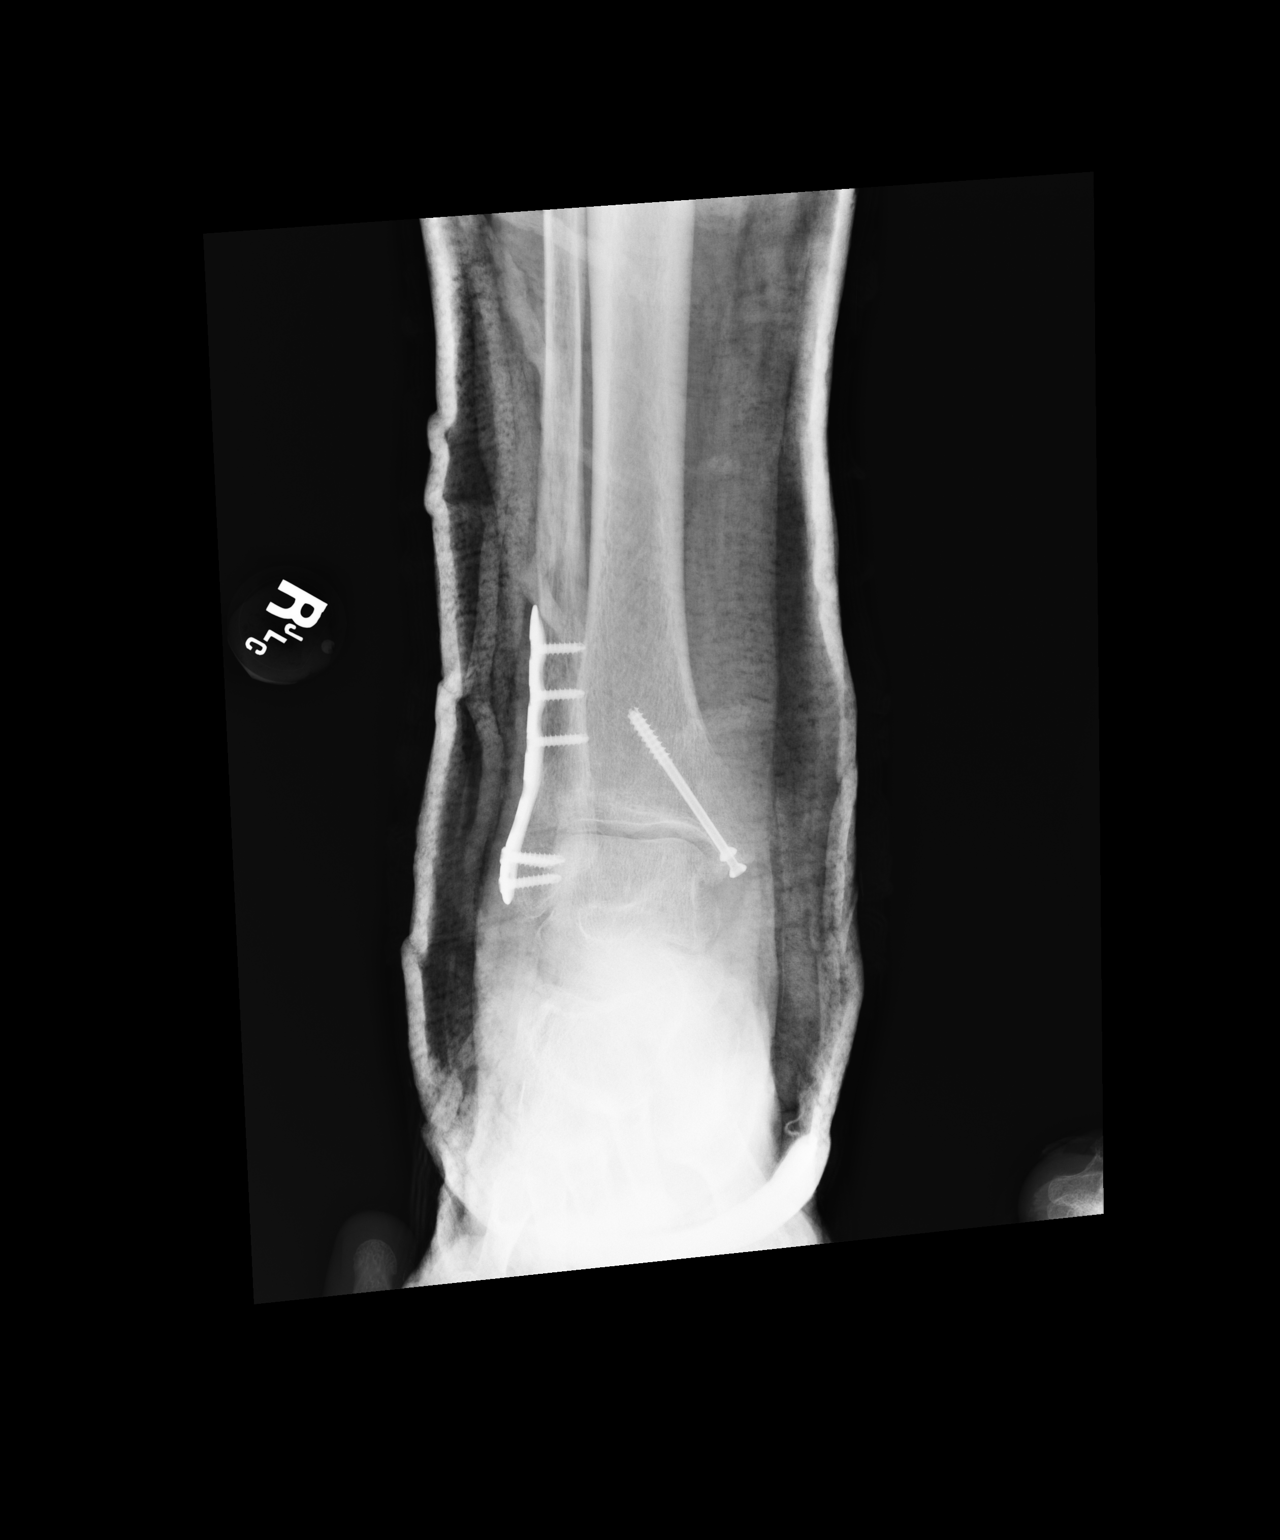

[lateral]
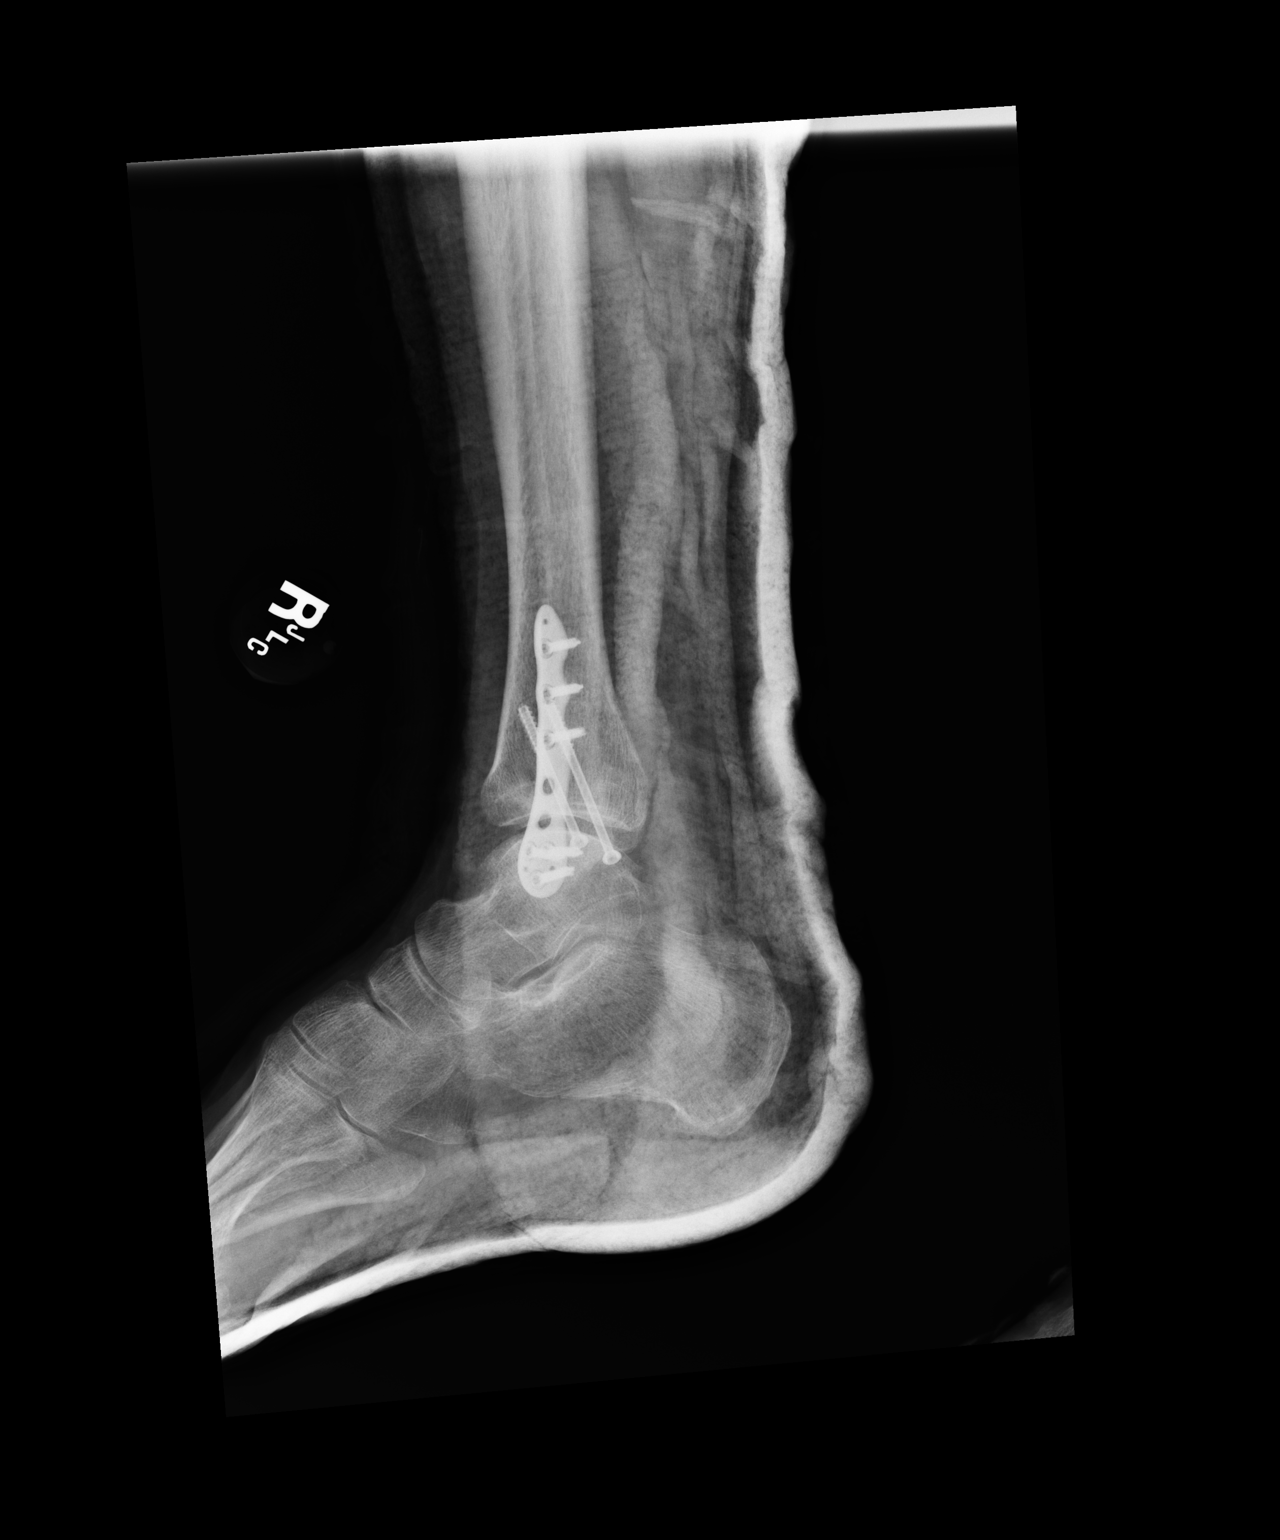

[2 of 2 positions shown; findings below may reference images not displayed]

FINDINGS: Cast material obscures fine detail. Stable plate and screw fixation
of the distal fibular shaft, and cannulated screw fixation of the
medial malleolus, hardware intact without surrounding lucency.
Fracture fragments in near anatomic alignment. No acute findings.
IMPRESSION: 1. Stable appearance post bimalleolar ORIF.

## 2018-05-10 IMAGING — CR DG CHEST 2V
2 series · 2 of 2 positions shown · non-contrast
Comparison: 01/19/2017

CLINICAL DATA: CHF, shortness of Breath

EXAM:
CHEST  2 VIEW

[chest lat]
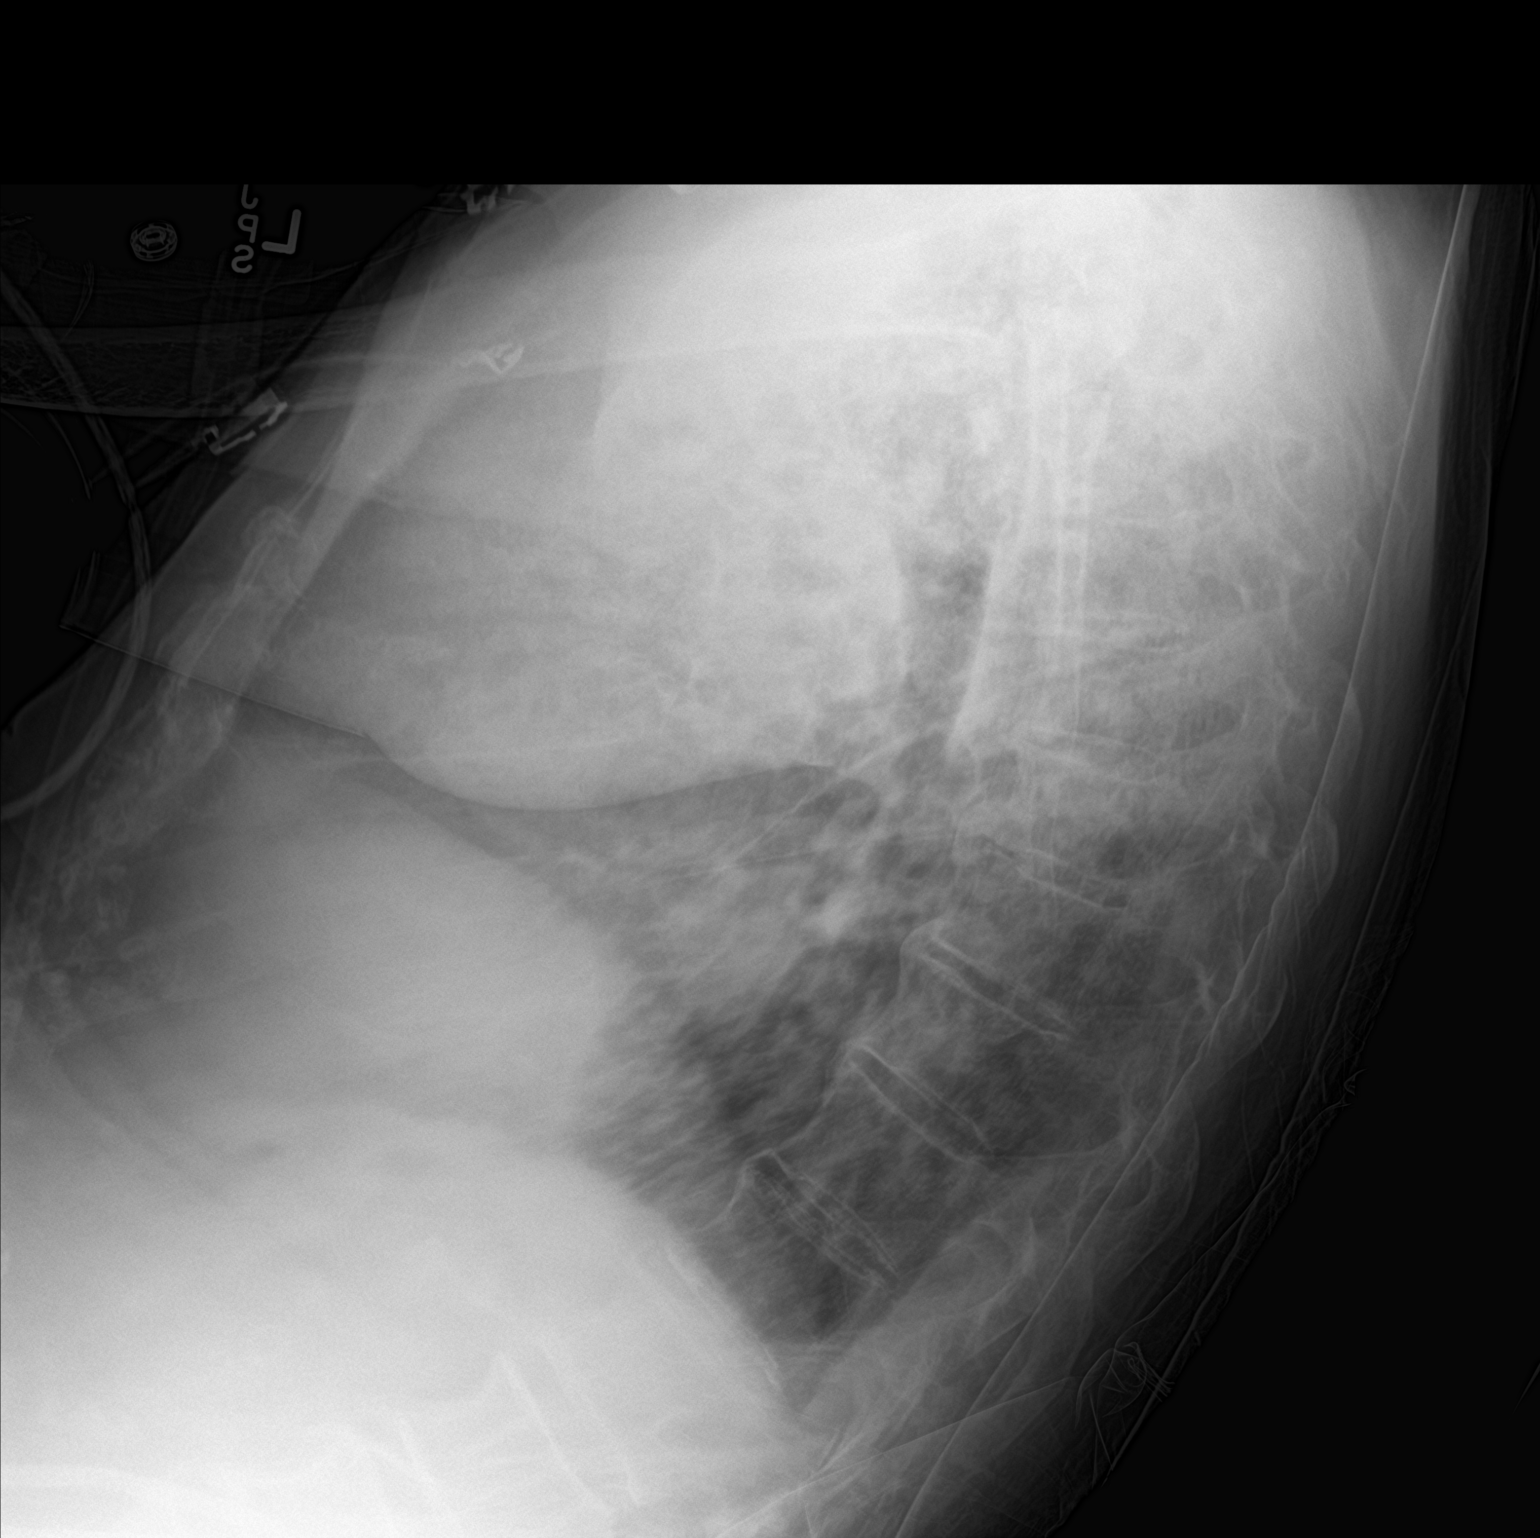

[chest ap]
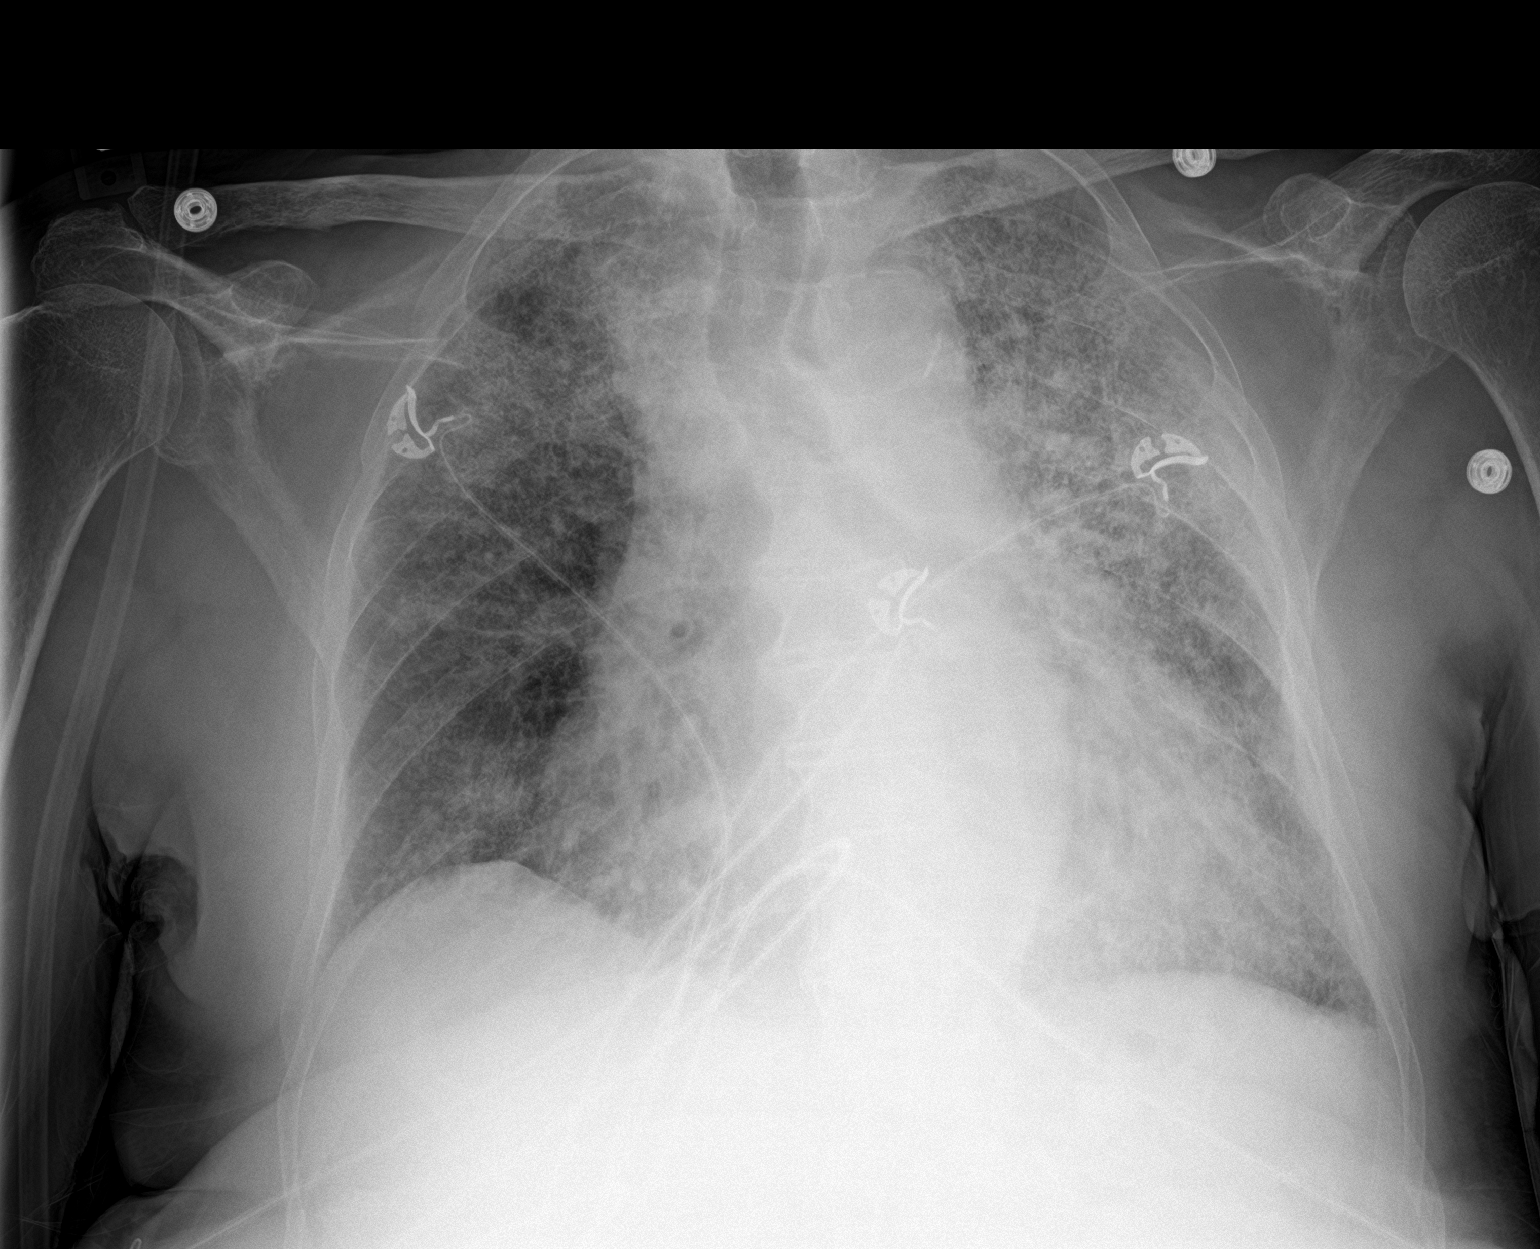

[2 of 2 positions shown; findings below may reference images not displayed]

FINDINGS: Stable cardiomegaly and diffuse interstitial opacities with
superimposed airspace opacities in the upper lobes. No change since
prior study. No effusions.
IMPRESSION: No significant change diffuse interstitial and bilateral upper lobe
airspace opacities. This could represent edema or infection.
